# Patient Record
Sex: Male | Born: 1955 | Race: Black or African American | Hispanic: No | Marital: Single | Smoking: Current every day smoker
Health system: Southern US, Community
[De-identification: ages and names within clinical notes are randomized; demographics above are authoritative.]

## PROBLEM LIST (undated history)

## (undated) DIAGNOSIS — F1010 Alcohol abuse, uncomplicated: Secondary | ICD-10-CM

## (undated) DIAGNOSIS — I70229 Atherosclerosis of native arteries of extremities with rest pain, unspecified extremity: Secondary | ICD-10-CM

## (undated) DIAGNOSIS — I10 Essential (primary) hypertension: Secondary | ICD-10-CM

## (undated) DIAGNOSIS — I998 Other disorder of circulatory system: Secondary | ICD-10-CM

## (undated) HISTORY — DX: Other disorder of circulatory system: I99.8

## (undated) HISTORY — DX: Atherosclerosis of native arteries of extremities with rest pain, unspecified extremity: I70.229

## (undated) HISTORY — PX: LEG SURGERY: SHX1003

## (undated) HISTORY — PX: BELOW KNEE LEG AMPUTATION: SUR23

---

## 2000-04-16 ENCOUNTER — Emergency Department (HOSPITAL_COMMUNITY): Admission: EM | Admit: 2000-04-16 | Discharge: 2000-04-16 | Payer: Self-pay | Admitting: Emergency Medicine

## 2001-07-26 ENCOUNTER — Emergency Department (HOSPITAL_COMMUNITY): Admission: EM | Admit: 2001-07-26 | Discharge: 2001-07-27 | Payer: Self-pay | Admitting: Emergency Medicine

## 2001-08-25 ENCOUNTER — Encounter: Payer: Self-pay | Admitting: Emergency Medicine

## 2001-08-25 ENCOUNTER — Emergency Department (HOSPITAL_COMMUNITY): Admission: EM | Admit: 2001-08-25 | Discharge: 2001-08-25 | Payer: Self-pay | Admitting: Emergency Medicine

## 2002-05-28 ENCOUNTER — Emergency Department (HOSPITAL_COMMUNITY): Admission: EM | Admit: 2002-05-28 | Discharge: 2002-05-28 | Payer: Self-pay | Admitting: Emergency Medicine

## 2002-11-17 ENCOUNTER — Encounter: Payer: Self-pay | Admitting: Emergency Medicine

## 2002-11-17 ENCOUNTER — Emergency Department (HOSPITAL_COMMUNITY): Admission: EM | Admit: 2002-11-17 | Discharge: 2002-11-17 | Payer: Self-pay | Admitting: Emergency Medicine

## 2002-12-29 ENCOUNTER — Emergency Department (HOSPITAL_COMMUNITY): Admission: EM | Admit: 2002-12-29 | Discharge: 2002-12-29 | Payer: Self-pay | Admitting: Emergency Medicine

## 2003-06-25 ENCOUNTER — Emergency Department (HOSPITAL_COMMUNITY): Admission: EM | Admit: 2003-06-25 | Discharge: 2003-06-25 | Payer: Self-pay | Admitting: Emergency Medicine

## 2003-10-31 ENCOUNTER — Emergency Department (HOSPITAL_COMMUNITY): Admission: EM | Admit: 2003-10-31 | Discharge: 2003-10-31 | Payer: Self-pay | Admitting: Emergency Medicine

## 2003-12-24 ENCOUNTER — Emergency Department (HOSPITAL_COMMUNITY): Admission: EM | Admit: 2003-12-24 | Discharge: 2003-12-25 | Payer: Self-pay | Admitting: Emergency Medicine

## 2004-10-14 ENCOUNTER — Emergency Department (HOSPITAL_COMMUNITY): Admission: EM | Admit: 2004-10-14 | Discharge: 2004-10-14 | Payer: Self-pay | Admitting: Emergency Medicine

## 2005-02-15 ENCOUNTER — Ambulatory Visit: Payer: Self-pay | Admitting: Family Medicine

## 2005-03-03 ENCOUNTER — Emergency Department (HOSPITAL_COMMUNITY): Admission: EM | Admit: 2005-03-03 | Discharge: 2005-03-03 | Payer: Self-pay | Admitting: Emergency Medicine

## 2005-05-18 ENCOUNTER — Emergency Department (HOSPITAL_COMMUNITY): Admission: EM | Admit: 2005-05-18 | Discharge: 2005-05-18 | Payer: Self-pay | Admitting: Emergency Medicine

## 2005-06-16 ENCOUNTER — Ambulatory Visit: Payer: Self-pay | Admitting: Internal Medicine

## 2005-07-23 ENCOUNTER — Emergency Department (HOSPITAL_COMMUNITY): Admission: EM | Admit: 2005-07-23 | Discharge: 2005-07-23 | Payer: Self-pay | Admitting: Emergency Medicine

## 2006-01-24 ENCOUNTER — Ambulatory Visit: Payer: Self-pay | Admitting: Family Medicine

## 2006-04-15 ENCOUNTER — Emergency Department (HOSPITAL_COMMUNITY): Admission: EM | Admit: 2006-04-15 | Discharge: 2006-04-15 | Payer: Self-pay | Admitting: Emergency Medicine

## 2007-04-03 ENCOUNTER — Ambulatory Visit: Payer: Self-pay | Admitting: Internal Medicine

## 2007-04-03 LAB — CONVERTED CEMR LAB
BUN: 8 mg/dL (ref 6–23)
CO2: 20 meq/L (ref 19–32)
Calcium: 9 mg/dL (ref 8.4–10.5)
Chloride: 103 meq/L (ref 96–112)
Creatinine, Ser: 0.8 mg/dL (ref 0.40–1.50)
Eosinophils Relative: 1 % (ref 0–5)
Glucose, Bld: 111 mg/dL — ABNORMAL HIGH (ref 70–99)
HCT: 41.9 % (ref 39.0–52.0)
Hemoglobin: 14.6 g/dL (ref 13.0–17.0)
Lymphocytes Relative: 37 % (ref 12–46)
Lymphs Abs: 2.4 10*3/uL (ref 0.7–3.3)
Monocytes Absolute: 0.4 10*3/uL (ref 0.2–0.7)
Monocytes Relative: 6 % (ref 3–11)
Neutro Abs: 3.7 10*3/uL (ref 1.7–7.7)
RBC: 4.18 M/uL — ABNORMAL LOW (ref 4.22–5.81)
RDW: 13.4 % (ref 11.5–14.0)
Total Bilirubin: 0.8 mg/dL (ref 0.3–1.2)
WBC: 6.6 10*3/uL (ref 4.0–10.5)

## 2007-04-06 ENCOUNTER — Encounter: Payer: Self-pay | Admitting: Internal Medicine

## 2007-04-06 LAB — CONVERTED CEMR LAB: HCV Ab: NEGATIVE

## 2008-04-15 ENCOUNTER — Emergency Department (HOSPITAL_COMMUNITY): Admission: EM | Admit: 2008-04-15 | Discharge: 2008-04-15 | Payer: Self-pay | Admitting: Emergency Medicine

## 2008-06-24 ENCOUNTER — Ambulatory Visit: Payer: Self-pay | Admitting: Internal Medicine

## 2009-06-25 ENCOUNTER — Ambulatory Visit: Payer: Self-pay | Admitting: Internal Medicine

## 2009-06-25 ENCOUNTER — Encounter (INDEPENDENT_AMBULATORY_CARE_PROVIDER_SITE_OTHER): Payer: Self-pay | Admitting: Adult Health

## 2009-06-25 LAB — CONVERTED CEMR LAB: Microalb, Ur: 0.91 mg/dL (ref 0.00–1.89)

## 2011-06-11 LAB — COMPREHENSIVE METABOLIC PANEL
ALT: 44
AST: 76 — ABNORMAL HIGH
Alkaline Phosphatase: 73
CO2: 29
Calcium: 8.7
GFR calc Af Amer: >60
Glucose, Bld: 107 — ABNORMAL HIGH
Potassium: 4.8
Sodium: 148 — ABNORMAL HIGH
Total Protein: 7.5

## 2011-06-11 LAB — DIFFERENTIAL
Basophils Relative: 1
Eosinophils Absolute: 0.3
Eosinophils Relative: 4
Lymphs Abs: 3.6
Monocytes Relative: 6
Neutrophils Relative %: 37 — ABNORMAL LOW

## 2011-06-11 LAB — CBC
Hemoglobin: 15.7
MCHC: 35.3
RBC: 4.32
RDW: 14

## 2011-09-26 ENCOUNTER — Encounter (HOSPITAL_COMMUNITY): Payer: Self-pay | Admitting: Emergency Medicine

## 2011-09-26 ENCOUNTER — Emergency Department (HOSPITAL_COMMUNITY)
Admission: EM | Admit: 2011-09-26 | Discharge: 2011-09-26 | Disposition: A | Discharge status: Home / Self Care | Payer: Medicaid Other | Attending: Emergency Medicine | Admitting: Emergency Medicine

## 2011-09-26 DIAGNOSIS — M129 Arthropathy, unspecified: Secondary | ICD-10-CM | POA: Insufficient documentation

## 2011-09-26 DIAGNOSIS — M1990 Unspecified osteoarthritis, unspecified site: Secondary | ICD-10-CM

## 2011-09-26 DIAGNOSIS — R000 Tachycardia, unspecified: Secondary | ICD-10-CM | POA: Insufficient documentation

## 2011-09-26 DIAGNOSIS — I10 Essential (primary) hypertension: Secondary | ICD-10-CM | POA: Insufficient documentation

## 2011-09-26 MED ORDER — DICLOFENAC-MISOPROSTOL 50-0.2 MG PO TABS: 1.0000 | ORAL_TABLET | ORAL | Status: DC

## 2011-09-26 MED ORDER — LISINOPRIL 10 MG PO TABS: 10.0000 mg | ORAL_TABLET | Freq: Every day | ORAL | Status: DC

## 2011-09-26 NOTE — ED Provider Notes (Signed)
History     CSN: [id number]  Arrival date & time 09/26/11  0049   First MD Initiated Contact with Patient 09/26/11 0154      Chief Complaint  Patient presents with  . Chest Pain    (Consider location/radiation/quality/duration/timing/severity/associated sxs/prior treatment) The history is provided by the patient.   56 year old male had been seen at his physician's [facility] days ago and told he had to go to the emergency department because his blood pressure was very high. His friend who is with him states that the blood pressure that day was 170/110. He refused to go in that day but says he was able to go today because his wife couldn't take him to the hospital today. He denies chest pain, but he is complaining that he has run out of his Arthrotec and his doctor would not prescribe more Arthrotec in so he had been evaluated for his blood pressure. He states that he stopped taking her hydrochlorothiazide for his blood pressure because it made him throw up. He states that he does not wish to have any further evaluation the emergency department and specifically denies chest pain, dyspnea, nausea, vomiting. He does admit to alcohol and tobacco abuse.  Past Medical History  Diagnosis Date  . Gout     History reviewed. No pertinent past surgical history.  History reviewed. No pertinent family history.  History  Substance Use Topics  . Smoking status: Not on file  . Smokeless tobacco: Not on file  . Alcohol Use: 72.0 oz/week    120 Cans of beer per week     "A whole Bunch - I am a drunk"      Review of Systems  Cardiovascular: Positive for chest pain.  All other systems reviewed and are negative.    Allergies  Review of patient's allergies indicates no known allergies.  Home Medications  No current outpatient prescriptions on file.  BP 134/90  Pulse 105  Temp(Src) 98.3 F (36.8 C) (Oral)  Resp 20  SpO2 97%  Physical Exam  Nursing note  and vitals reviewed.  56 year old male who is resting comfortably and in no acute distress. Vital signs show mild tachycardia with heart rate of 105. Oxygen saturation is 97% which is normal. Head is normocephalic and atraumatic. PERRLA, EOMI. Oropharynx is clear. Neck is supple without adenopathy or JVD. Back is nontender. Lungs are clear without rales, wheezes, rhonchi. Heart has regular rate and rhythm without murmur. Abdomen is soft, flat, nontender without masses or hepatosplenomegaly. Extremities have no cyanosis or edema, full range of motion is present. Skin is warm and moist without rashes. Neurologic: Mental status is normal, cranial nerves are intact, there no focal motor or sensory deficits.  ED Course  Procedures (including critical care time)  Labs Reviewed - No data to display No results found.   1. Hypertension   2. Arthritis       MDM  Arthritis and hypertension. He is given new prescription for his Arthrotec, and he is given a prescription for lisinopril to replace his hydrochlorothiazide. Importance of followup for blood pressure and arthritis was stressed, as well as the importance of stopping smoking and limiting alcohol consumption.        Dione Booze, MD 09/26/11 [phone]

## 2011-09-26 NOTE — ED Notes (Signed)
Patient states that he went to [facility] 2 -3 days ago and they would not give him his pain medications until he was evaluated for his chest pain. The patient keeps saying "I'm Dead"

## 2012-03-04 ENCOUNTER — Emergency Department (HOSPITAL_COMMUNITY)
Admission: EM | Admit: 2012-03-04 | Discharge: 2012-03-05 | Disposition: A | Discharge status: Home / Self Care | Payer: Medicaid Other | Attending: Emergency Medicine | Admitting: Emergency Medicine

## 2012-03-04 ENCOUNTER — Encounter (HOSPITAL_COMMUNITY): Payer: Self-pay | Admitting: *Deleted

## 2012-03-04 DIAGNOSIS — F10929 Alcohol use, unspecified with intoxication, unspecified: Secondary | ICD-10-CM

## 2012-03-04 DIAGNOSIS — F1010 Alcohol abuse, uncomplicated: Secondary | ICD-10-CM | POA: Insufficient documentation

## 2012-03-04 LAB — BASIC METABOLIC PANEL
BUN: 13 mg/dL (ref 6–23)
Calcium: 8.4 mg/dL (ref 8.4–10.5)
Creatinine, Ser: 0.67 mg/dL (ref 0.50–1.35)
GFR calc Af Amer: >90 mL/min (ref >90)
GFR calc non Af Amer: >90 mL/min (ref >90)
Glucose, Bld: 95 mg/dL (ref 70–99)

## 2012-03-04 LAB — CBC
HCT: 39.4 % (ref 39.0–52.0)
Hemoglobin: 13.8 g/dL (ref 13.0–17.0)
MCH: 33.7 pg (ref 26.0–34.0)
MCHC: 35 g/dL (ref 30.0–36.0)
MCV: 96.1 fL (ref 78.0–100.0)
RDW: 12.9 % (ref 11.5–15.5)

## 2012-03-04 MED ORDER — SODIUM CHLORIDE 0.9 % IV BOLUS (SEPSIS)
1000.0000 mL | Freq: Once | INTRAVENOUS | Status: AC
  Administered 2012-03-04: 1000 mL via INTRAVENOUS

## 2012-03-04 NOTE — ED Notes (Signed)
Pt was found at [facility] where his wife used to live and staff called [phone]. Pt laying in floor sleeping and belligerent. Pt smells of ETOH and admits to drinking today. Pt does not want help with his drinking issues.

## 2012-03-04 NOTE — ED Notes (Addendum)
Patient hollering out noises when tended it to patient asked why patient is here. Patient explained. Patient becomes calm. Patient will keep hollering out and trying to slide out of bed after a while and doesn't remember being explained to. Patient soils whole bed

## 2012-03-04 NOTE — ED Notes (Signed)
Urine collected, labeled and placed at bedside 

## 2012-03-04 NOTE — ED Notes (Signed)
Pt placed on monitor. 02 saturations 80% on RA while sleeping. Pt placed on [place] 2L and 02 up to 95%.    Pt alert, but disoriented. Pt rambles about not knowing where he is or what happened.

## 2012-03-04 NOTE — ED Notes (Signed)
ZOX:[id number]<AV> Expected date:03/04/12<BR> Expected time: 5:55 PM<BR> Means of arrival:Ambulance<BR> Comments:<BR> BH. Pt35. 56 m. Detox req. Pt is intoxicated and requesting detox for alcohol. 8 mins

## 2012-03-05 NOTE — Discharge Instructions (Signed)
Alcohol Intoxication  You have alcohol intoxication when the amount of alcohol that you have consumed has impaired your ability to mentally and physically function. There are a variety of factors that contribute to the level at which alcohol intoxication can occur, such as age, gender, weight, frequency of alcohol consumption, medication use, and the presence of other medical conditions, such as diabetes, seizures, or heart conditions.  The blood alcohol level test measures the concentration of alcohol in your blood. In most states, your blood alcohol level must be lower than 80 mg/dL (0.08%) to legally drive. However, many dangerous effects of alcohol can occur at much lower levels.  Alcohol directly impairs the normal chemical activity of the brain and is said to be a chemical depressant. Alcohol can cause drowsiness, stupor, respiratory failure, and coma. Other physical effects can include headache, vomiting, vomiting of blood, abdominal pain, a fast heartbeat, difficulty breathing, anxiety, and amnesia. Alcohol intoxication can also lead to dangerous and life-threatening activities, such as fighting, dangerous operation of vehicles or heavy machinery, and risky sexual behavior.  Alcohol can be especially dangerous when taken with other drugs. Some of these drugs are:   Sedatives.   Painkillers.   Marijuana.   Tranquilizers.   Antihistamines.   Muscle relaxants.   Seizure medicine.  Many of the effects of acute alcohol intoxication are temporary. However, repeated alcohol intoxication can lead to severe medical illnesses. If you have alcohol intoxication, you should:   Stay hydrated. Drink enough water and fluids to keep your urine clear or pale yellow. Avoid excessive caffeine because this can further lead to dehydration.   Eat a healthy diet. You may have residual nausea, headache, and loss of appetite, but it is still important that you maintain good nutrition. You can start with clear  liquids.   Take nonsteroidal anti-inflammatory medications as needed for headaches, but make sure to do so with small meals. You should avoid acetaminophen for several days after having alcohol intoxication because the combination of alcohol and acetaminophen can be toxic to your liver.  If you have frequent alcohol intoxication, ask your friends and family if they think you have a drinking problem. For further help, contact:   Your caregiver.   Alcoholics Anonymous (AA).   A drug or alcohol rehabilitation program.  SEEK MEDICAL CARE IF:    You have persistent vomiting.   You have persistent pain in any part of your body.   You do not feel better after a few days.  SEEK IMMEDIATE MEDICAL CARE IF:    You become shaky or tremble when you try to stop drinking.   You shake uncontrollably (seizure).   You throw up (vomit) blood. This may be bright red or it may look like black coffee grounds.   You have blood in the stool. This may be bright red or appear as a black, tarry, bad smelling stool.   You become lightheaded or faint.  ANY OF THESE SYMPTOMS MAY REPRESENT A SERIOUS PROBLEM THAT IS AN EMERGENCY. Do not wait to see if the symptoms will go away. Get medical help right away. Call your local emergency services (911 in [place] DO NOT drive yourself to the hospital.  MAKE SURE YOU:    Understand these instructions.   Will watch your condition.   Will get help right away if you are not doing well or get worse.  Document Released: 06/09/2005 Document Revised: 08/19/2011 Document Reviewed: 02/16/2010  [employer] Patient Information 2012 [employer].

## 2012-03-05 NOTE — ED Notes (Signed)
Patient ambulated in hall to assure stability for when patient is discharged. Patient able to ambulate independently without assistance of staff. MD Brooke Dare visualized patient ambulating.

## 2012-03-05 NOTE — ED Notes (Signed)
Patient given discharge instructions, information, prescriptions, and diet order. Patient states that they adequately understand discharge information given and to return to ED if symptoms return or worsen.    Pt informed to slow down/stop drinking and informed that it can be dangerous to try to do that alone. To return to [facility] if he would like detox help.

## 2012-03-05 NOTE — ED Provider Notes (Signed)
The patient ambulated without assistance. He will be discharged.  Dayton Bailiff, MD 03/05/12 [phone]

## 2012-05-23 NOTE — ED Provider Notes (Signed)
History     CSN: [id number]  Arrival date & time 03/04/12  1806   First MD Initiated Contact with Patient 03/04/12 1838      Chief Complaint  Patient presents with  . Alcohol Intoxication     The history is provided by the patient and the EMS personnel. History Limited By: Level V caveat: AMS, alcohol intoxication.  pt found lying on floor at ex wife house today. Smells of ETOH to EMS. Brought to ER for evaluation. Pt reports drinking a large amount of ETOH today. No HI or SI. No CP or SOB. No headache or neck pain. No weakness of upper or lower extremities. Reports "just let me sleep". EMS report no obvious trauma. Pt reports long time ETOH abuse, does not want help with his problem today  Past Medical History  Diagnosis Date  . Gout     History reviewed. No pertinent past surgical history.  No family history on file.  History  Substance Use Topics  . Smoking status: Not on file  . Smokeless tobacco: Not on file  . Alcohol Use: 72.0 oz/week    120 Cans of beer per week     "A whole Bunch - I am a drunk"      Review of Systems  Unable to perform ROS   Allergies  Review of patient's allergies indicates no known allergies.  Home Medications   Current Outpatient Rx  Name Route Sig Dispense Refill  . DICLOFENAC-MISOPROSTOL 50-200 MG-MCG PO TABS Oral Take 1 tablet by mouth 1 day or 1 dose. 30 tablet 0  . LISINOPRIL 10 MG PO TABS Oral Take 1 tablet (10 mg total) by mouth daily. 30 tablet 0    BP 108/68  Pulse 79  Temp 97.5 F (36.4 C) (Oral)  Resp 16  SpO2 99%  Physical Exam  Nursing note and vitals reviewed. Constitutional: He appears well-developed and well-nourished.  HENT:  Head: Normocephalic and atraumatic.  Eyes: EOM are normal.  Neck: Normal range of motion.  Cardiovascular: Normal rate, regular rhythm, normal heart sounds and intact distal pulses.   Pulmonary/Chest: Effort normal and breath sounds normal. No respiratory distress.  Abdominal:  Soft. He exhibits no distension. There is no tenderness.  Musculoskeletal: Normal range of motion.  Neurological:       Awakens to loud voice  Skin: Skin is warm and dry.  Psychiatric: He has a normal mood and affect. Judgment normal.    ED Course  Procedures (including critical care time)  Labs Reviewed  ETHANOL - Abnormal; Notable for the following:    Alcohol, Ethyl (B) 426 (*)     All other components within normal limits  CBC - Abnormal; Notable for the following:    RBC 4.10 (*)     All other components within normal limits  BASIC METABOLIC PANEL - Abnormal; Notable for the following:    Potassium 3.3 (*)     All other components within normal limits  LAB REPORT - SCANNED   No results found.   1. Alcohol intoxication       MDM  Pt will need to sober in ER. Does not want help with his ETOH. Care to Dr Brooke Dare        Lyanne Co, MD 05/23/12 [phone]

## 2012-07-14 ENCOUNTER — Emergency Department (HOSPITAL_COMMUNITY)
Admission: EM | Admit: 2012-07-14 | Discharge: 2012-07-15 | Disposition: A | Discharge status: Home / Self Care | Payer: Medicaid Other | Attending: Emergency Medicine | Admitting: Emergency Medicine

## 2012-07-14 ENCOUNTER — Other Ambulatory Visit: Payer: Self-pay

## 2012-07-14 ENCOUNTER — Encounter (HOSPITAL_COMMUNITY): Payer: Self-pay | Admitting: Emergency Medicine

## 2012-07-14 DIAGNOSIS — Z79899 Other long term (current) drug therapy: Secondary | ICD-10-CM | POA: Insufficient documentation

## 2012-07-14 DIAGNOSIS — M109 Gout, unspecified: Secondary | ICD-10-CM

## 2012-07-14 DIAGNOSIS — R51 Headache: Secondary | ICD-10-CM

## 2012-07-14 DIAGNOSIS — F17200 Nicotine dependence, unspecified, uncomplicated: Secondary | ICD-10-CM | POA: Insufficient documentation

## 2012-07-14 DIAGNOSIS — H538 Other visual disturbances: Secondary | ICD-10-CM | POA: Insufficient documentation

## 2012-07-14 DIAGNOSIS — I10 Essential (primary) hypertension: Secondary | ICD-10-CM

## 2012-07-14 HISTORY — DX: Essential (primary) hypertension: I10

## 2012-07-14 LAB — CBC
Hemoglobin: 14.6 g/dL (ref 13.0–17.0)
MCH: 33 pg (ref 26.0–34.0)
MCHC: 36.2 g/dL — ABNORMAL HIGH (ref 30.0–36.0)
Platelets: 287 10*3/uL (ref 150–400)
RDW: 13.6 % (ref 11.5–15.5)

## 2012-07-14 LAB — COMPREHENSIVE METABOLIC PANEL
ALT: 15 U/L (ref 0–53)
Albumin: 4.6 g/dL (ref 3.5–5.2)
Calcium: 9.1 mg/dL (ref 8.4–10.5)
GFR calc Af Amer: >90 mL/min (ref >90)
Glucose, Bld: 107 mg/dL — ABNORMAL HIGH (ref 70–99)
Sodium: 142 mEq/L (ref 135–145)
Total Protein: 8.4 g/dL — ABNORMAL HIGH (ref 6.0–8.3)

## 2012-07-14 LAB — POCT I-STAT TROPONIN I: Troponin i, poc: 0.02 ng/mL (ref 0.00–0.08)

## 2012-07-14 MED ORDER — LISINOPRIL 10 MG PO TABS: 10.0000 mg | ORAL_TABLET | Freq: Every day | ORAL | Status: DC

## 2012-07-14 MED ORDER — KETOROLAC TROMETHAMINE 60 MG/2ML IM SOLN
60.0000 mg | Freq: Once | INTRAMUSCULAR | Status: AC
  Administered 2012-07-15: 60 mg via INTRAMUSCULAR
  Filled 2012-07-14: qty 2

## 2012-07-14 MED ORDER — COLCHICINE 0.6 MG PO TABS: 0.6000 mg | ORAL_TABLET | Freq: Two times a day (BID) | ORAL | Status: DC

## 2012-07-14 NOTE — ED Notes (Addendum)
Pt reports last took BP meds in Jan; needs BP meds; pt +ETOH, states has had 3-4 40 ounces tonight; grips equal, speech slurred-pt reports is normal, no drift noted, face symmetrical; pt appears very intoxicated at triage; pt does report having headache; pt reports having cocaine about an hour and half ago; pt reports hx of heart trouble with tachycardia; pt denies CP currently; very difficult to obtain info from pt d/t ETOH

## 2012-07-14 NOTE — ED Notes (Signed)
Pt reports (L) foot/toe pain and a headache to top of head has not had his high blood pressure medicine in 9 months. Pt denies chest pain, sob, back/abd pain, N/V, cough or congestion. Pt reports he has been taking his sister's bp meds "here and there." Pt grips and strengths are equal bilateral, no facial droop or arm drift. Pt reports drinking "a lot of beer" tonight and smoked cracked. Pt sts "my friends gave it to me."  Pt also reports blurry vision w/a hx of the same x3-4 years

## 2012-07-14 NOTE — ED Provider Notes (Signed)
History     CSN: [id number]  Arrival date & time 07/14/12  2023   First MD Initiated Contact with Patient 07/14/12 2259      Chief Complaint  Patient presents with  . Hypertension    (Consider location/radiation/quality/duration/timing/severity/associated sxs/prior treatment) HPI Comments: 56 year old male with a history of hypertension and gout who presents with several complaints including a headache, left great toe pain, intermittent palpitations and intermittent blurred vision over the last several years. He admits to drinking alcohol this evening as well as smoking crack. The headache is mild, the pain in his left foot is significant and consistent with prior gout episodes. He has not had medication prior to arrival for pain though he has been taking his sister's blood pressure medication over the last 9 months. In the past he has been taking lisinopril for blood pressure. He had also been on a beta blocker before that. He does not currently have his own prescription for these medications. The symptoms are mild, intermittent, nothing seems to make them better or worse, no associated chest pain shortness of breath fevers or chills or nausea or vomiting.  Patient is a 56 y.o. male presenting with hypertension. The history is provided by the patient.  Hypertension    Past Medical History  Diagnosis Date  . Gout   . Hypertension     Past Surgical History  Procedure Date  . Leg surgery     History reviewed. No pertinent family history.  History  Substance Use Topics  . Smoking status: Current Every Day Smoker -- 0.5 packs/day    Types: Cigarettes  . Smokeless tobacco: Not on file  . Alcohol Use: 72.0 oz/week    120 Cans of beer per week     "A whole Bunch - I am a drunk"      Review of Systems  All other systems reviewed and are negative.    Allergies  Review of patient's allergies indicates no known allergies.  Home Medications   Current Outpatient Rx  Name  Route Sig Dispense Refill  . IBUPROFEN 200 MG PO TABS Oral Take 200 mg by mouth every 6 (six) hours as needed. For pain    . COLCHICINE 0.6 MG PO TABS Oral Take 1 tablet (0.6 mg total) by mouth 2 (two) times daily. 8 tablet 0  . LISINOPRIL 10 MG PO TABS Oral Take 1 tablet (10 mg total) by mouth daily. 30 tablet 1    BP 122/76  Pulse 91  Temp 98 F (36.7 C) (Oral)  Resp 19  SpO2 96%  Physical Exam  Nursing note and vitals reviewed. Constitutional: He appears well-developed and well-nourished. No distress.  HENT:  Head: Normocephalic and atraumatic.  Mouth/Throat: Oropharynx is clear and moist. No oropharyngeal exudate.  Eyes: Conjunctivae normal and EOM are normal. Pupils are equal, round, and reactive to light. Right eye exhibits no discharge. Left eye exhibits no discharge. No scleral icterus.  Neck: Normal range of motion. Neck supple. No JVD present. No thyromegaly present.  Cardiovascular: Normal rate, regular rhythm, normal heart sounds and intact distal pulses.  Exam reveals no gallop and no friction rub.   No murmur heard. Pulmonary/Chest: Effort normal and breath sounds normal. No respiratory distress. He has no wheezes. He has no rales.  Abdominal: Soft. Bowel sounds are normal. He exhibits no distension and no mass. There is no tenderness.  Musculoskeletal: Normal range of motion. He exhibits tenderness (swelling and tenderness over the first MTP of the left foot).  He exhibits no edema.  Lymphadenopathy:    He has no cervical adenopathy.  Neurological: He is alert. Coordination normal.  Skin: Skin is warm and dry. No rash noted. No erythema.  Psychiatric: He has a normal mood and affect. His behavior is normal.    ED Course  Procedures (including critical care time)  Labs Reviewed  CBC - Abnormal; Notable for the following:    MCHC 36.2 (*)     All other components within normal limits  COMPREHENSIVE METABOLIC PANEL - Abnormal; Notable for the following:    Glucose,  Bld 107 (*)     Total Protein 8.4 (*)     Total Bilirubin 0.2 (*)     All other components within normal limits  POCT I-STAT TROPONIN I   No results found.   1. Gout   2. Headache   3. Hypertension       MDM  The patient has normal neurologic exam with gait speech and coordination. He is able to walk without any difficulty and his cardiac and pulmonary exams are normal as well. His lab work shows that he has no anemia, no leukocytosis, normal renal function and normal electrolytes. We'll begin treatment with lisinopril though his blood pressure is normal today he has been using his sister's blood pressure medications. We'll avoid beta blockers as the patient apparently is abusing drugs though he states this is a rare event for him. The patient appears stable for discharge.  ED ECG REPORT  I personally interpreted this EKG   Date: 07/14/2012   Rate: 120  Rhythm: sinus tachycardia  QRS Axis: left  Intervals: normal  ST/T Wave abnormalities: normal conduction Disutrbances:none  Narrative Interpretation:   Old EKG Reviewed: none available  Vital signs have improved blood pressure of 122/76 and a pulse of 91 on discharge.   Discharge Prescriptions include:   Lisinopril  Colchicine       Vida Roller, MD 07/14/12 2342

## 2012-07-15 NOTE — ED Notes (Signed)
Patient given copy of discharge paperwork; went over discharge instructions with patient.  Patient instructed to take prescriptions as directed, to follow up with primary care physician/referrals as directed for follow up/medication refills, and to return to the ED for new, worsening, or concerning symptoms.

## 2013-02-24 ENCOUNTER — Emergency Department (HOSPITAL_COMMUNITY): Payer: Medicaid Other

## 2013-02-24 DIAGNOSIS — M79609 Pain in unspecified limb: Secondary | ICD-10-CM | POA: Insufficient documentation

## 2013-02-24 DIAGNOSIS — R079 Chest pain, unspecified: Secondary | ICD-10-CM | POA: Insufficient documentation

## 2013-02-24 DIAGNOSIS — Z791 Long term (current) use of non-steroidal anti-inflammatories (NSAID): Secondary | ICD-10-CM | POA: Insufficient documentation

## 2013-02-24 DIAGNOSIS — R109 Unspecified abdominal pain: Secondary | ICD-10-CM | POA: Insufficient documentation

## 2013-02-24 DIAGNOSIS — M109 Gout, unspecified: Secondary | ICD-10-CM | POA: Insufficient documentation

## 2013-02-24 DIAGNOSIS — R112 Nausea with vomiting, unspecified: Secondary | ICD-10-CM | POA: Insufficient documentation

## 2013-02-24 DIAGNOSIS — Z79899 Other long term (current) drug therapy: Secondary | ICD-10-CM | POA: Insufficient documentation

## 2013-02-24 DIAGNOSIS — F10229 Alcohol dependence with intoxication, unspecified: Secondary | ICD-10-CM | POA: Insufficient documentation

## 2013-02-24 LAB — BASIC METABOLIC PANEL
CO2: 20 mEq/L (ref 19–32)
Calcium: 9.2 mg/dL (ref 8.4–10.5)
Chloride: 105 mEq/L (ref 96–112)
Potassium: 3.2 mEq/L — ABNORMAL LOW (ref 3.5–5.1)
Sodium: 140 mEq/L (ref 135–145)

## 2013-02-24 LAB — POCT I-STAT TROPONIN I: Troponin i, poc: 0 ng/mL (ref 0.00–0.08)

## 2013-02-24 LAB — CBC
Platelets: 280 10*3/uL (ref 150–400)
RBC: 4.26 MIL/uL (ref 4.22–5.81)
WBC: 9.9 10*3/uL (ref 4.0–10.5)

## 2013-02-24 LAB — LIPASE, BLOOD: Lipase: 65 U/L — ABNORMAL HIGH (ref 11–59)

## 2013-02-24 MED ORDER — ONDANSETRON 4 MG PO TBDP
ORAL_TABLET | ORAL | Status: AC
  Filled 2013-02-24: qty 1

## 2013-02-24 MED ORDER — ONDANSETRON HCL 4 MG PO TABS: 4.0000 mg | ORAL_TABLET | Freq: Once | ORAL | Status: DC

## 2013-02-24 NOTE — ED Notes (Addendum)
Pt. N/v to ~ 1 hr. Ago. bp been high today. Vomiting blood according to family but pt. Denies it. ETOH on board. Cp x 1 week ago..centralized

## 2013-02-25 ENCOUNTER — Emergency Department (HOSPITAL_COMMUNITY)
Admission: EM | Admit: 2013-02-25 | Discharge: 2013-02-25 | Disposition: A | Discharge status: Home / Self Care | Payer: Medicaid Other | Attending: Emergency Medicine | Admitting: Emergency Medicine

## 2013-02-25 DIAGNOSIS — R109 Unspecified abdominal pain: Secondary | ICD-10-CM

## 2013-02-25 DIAGNOSIS — R112 Nausea with vomiting, unspecified: Secondary | ICD-10-CM

## 2013-02-25 DIAGNOSIS — M79601 Pain in right arm: Secondary | ICD-10-CM

## 2013-02-25 DIAGNOSIS — F10920 Alcohol use, unspecified with intoxication, uncomplicated: Secondary | ICD-10-CM

## 2013-02-25 LAB — HEPATIC FUNCTION PANEL
Alkaline Phosphatase: 54 U/L (ref 39–117)
Total Protein: 7.5 g/dL (ref 6.0–8.3)

## 2013-02-25 LAB — ETHANOL: Alcohol, Ethyl (B): 180 mg/dL — ABNORMAL HIGH (ref 0–11)

## 2013-02-25 MED ORDER — OMEPRAZOLE 20 MG PO CPDR: 40.0000 mg | DELAYED_RELEASE_CAPSULE | Freq: Every day | ORAL | Status: DC

## 2013-02-25 MED ORDER — ONDANSETRON 4 MG PO TBDP: 4.0000 mg | ORAL_TABLET | Freq: Three times a day (TID) | ORAL | Status: DC | PRN

## 2013-02-25 NOTE — ED Provider Notes (Signed)
History     CSN: [id number]  Arrival date & time 02/24/13  2017   First MD Initiated Contact with Patient 02/25/13 0035      Chief Complaint  Patient presents with  . Weakness  . Chest Pain  . Emesis  . Alcohol Intoxication    (Consider location/radiation/quality/duration/timing/severity/associated sxs/prior treatment) HPI 57 yo male presents to the ER brought in by family.  They are no longer present, and history is limited to the patient who is intoxicated.  He reports he went to a friends house today and drank quite a bit.  Pt drinks daily.  He does not wish to stop at this time.  He is unsure how his sister came to bring him to the ER.  He is complaining of right arm pain ongoing for 8-9 months.  He is also c/o left flank pain x 2 weeks.  It is reported he had some chest pain about a week ago, but he reports this only lasted a brief time then, and has not returned.  There is report of n/v, possibly with blood.  Pt denies prior h/o bloody emesis.  He does not remember how much he threw up or what it looked like.  He does not think he threw up blood.  Pt reports his blood pressure has been running high due to running out of his bp meds.  He does not know the name of what he was on. Past Medical History  Diagnosis Date  . Gout   . Hypertension     Past Surgical History  Procedure Laterality Date  . Leg surgery      No family history on file.  History  Substance Use Topics  . Smoking status: Current Every Day Smoker -- 0.50 packs/day    Types: Cigarettes  . Smokeless tobacco: Not on file  . Alcohol Use: 72.0 oz/week    120 Cans of beer per week     Comment: "A whole Bunch - I am a drunk"      Review of Systems  Unable to perform ROS: Other   Intoxicated  Allergies  Review of patient's allergies indicates no known allergies.  Home Medications   Current Outpatient Rx  Name  Route  Sig  Dispense  Refill  . Naproxen Sodium (ALEVE PO)   Oral   Take 1-2 tablets by  mouth every 12 (twelve) hours as needed (leg and shoulder pain).         Marland Kitchen omeprazole (PRILOSEC) 20 MG capsule   Oral   Take 2 capsules (40 mg total) by mouth daily.   30 capsule   0   . ondansetron (ZOFRAN-ODT) 4 MG disintegrating tablet   Oral   Take 1 tablet (4 mg total) by mouth every 8 (eight) hours as needed for nausea.   20 tablet   0     BP 118/74  Pulse 67  Temp(Src) 98.4 F (36.9 C) (Oral)  Resp 13  SpO2 94%  Physical Exam  Nursing note and vitals reviewed. Constitutional: He is oriented to person, place, and time. He appears well-developed and well-nourished.  HENT:  Head: Normocephalic and atraumatic.  Right Ear: External ear normal.  Left Ear: External ear normal.  Nose: Nose normal.  Mouth/Throat: Oropharynx is clear and moist.  Eyes: Conjunctivae and EOM are normal. Pupils are equal, round, and reactive to light.  Neck: Normal range of motion. Neck supple. No JVD present. No tracheal deviation present. No thyromegaly present.  Cardiovascular: Normal rate,  regular rhythm, normal heart sounds and intact distal pulses.  Exam reveals no gallop and no friction rub.   No murmur heard. Pulmonary/Chest: Effort normal and breath sounds normal. No stridor. No respiratory distress. He has no wheezes. He has no rales. He exhibits no tenderness.  Abdominal: Soft. Bowel sounds are normal. He exhibits no distension and no mass. There is tenderness (mild ttp in mid left abd and flank.  no trauma noted, no rebound or guarding,). There is no rebound and no guarding.  Musculoskeletal: Normal range of motion. He exhibits tenderness. He exhibits no edema.  ttp over entire right arm.  Normal ROM, no skin changes, no deformities, no effusion, no crepitus.  Normal cap refill, normal sensation  Lymphadenopathy:    He has no cervical adenopathy.  Neurological: He is oriented to person, place, and time. He has normal reflexes. No cranial nerve deficit. He exhibits normal muscle tone.  Coordination normal.  Skin: Skin is warm and dry. No rash noted. No erythema. No pallor.  Psychiatric: He has a normal mood and affect. His behavior is normal. Judgment and thought content normal.    ED Course  Procedures (including critical care time)  Labs Reviewed  CBC - Abnormal; Notable for the following:    MCHC 36.2 (*)    All other components within normal limits  BASIC METABOLIC PANEL - Abnormal; Notable for the following:    Potassium 3.2 (*)    All other components within normal limits  LIPASE, BLOOD - Abnormal; Notable for the following:    Lipase 65 (*)    All other components within normal limits  ETHANOL - Abnormal; Notable for the following:    Alcohol, Ethyl (B) 180 (*)    All other components within normal limits  HEPATIC FUNCTION PANEL - Abnormal; Notable for the following:    Total Bilirubin 0.2 (*)    All other components within normal limits  POCT I-STAT TROPONIN I   Dg Chest 2 View  02/24/2013   *RADIOLOGY REPORT*  Clinical Data: Chest pain, weakness.  CHEST - 2 VIEW  Comparison: None.  Findings: Coarse perihilar and infrahilar interstitial markings. No confluent airspace infiltrate or overt edema.  No effusion. Heart size upper limits normal.  Regional bones unremarkable.  IMPRESSION:  1.  No acute disease   Original Report Authenticated By: D. Andria Rhein, MD    Date: 02/24/2013  Rate: 113   Rhythm: sinus tachycardia  QRS Axis: normal  Intervals: normal  ST/T Wave abnormalities: normal  Conduction Disutrbances:none  Narrative Interpretation:   Old EKG Reviewed: none available    1. Arm pain, right   2. Left flank pain   3. Alcohol intoxication, uncomplicated   4. Nausea and vomiting       MDM  57 year old male complaining of chronic right arm pain, left flank pain for 2 weeks.  Currently intoxicated.  Patient advised that he said stop smoking as this will help his underlying gallop.  Workup otherwise, unremarkable.  Patient given followup  information        Olivia Mackie, MD 02/26/13 [phone]

## 2013-09-18 ENCOUNTER — Encounter (HOSPITAL_COMMUNITY): Payer: Self-pay | Admitting: Emergency Medicine

## 2013-09-18 ENCOUNTER — Emergency Department (HOSPITAL_COMMUNITY): Payer: Medicaid Other

## 2013-09-18 ENCOUNTER — Inpatient Hospital Stay (HOSPITAL_COMMUNITY)
Admission: EM | Admit: 2013-09-18 | Discharge: 2013-09-26 | DRG: 603 | Disposition: A | Discharge status: Home Health | Payer: Medicaid Other | Attending: Internal Medicine | Admitting: Internal Medicine

## 2013-09-18 DIAGNOSIS — F1010 Alcohol abuse, uncomplicated: Secondary | ICD-10-CM | POA: Diagnosis present

## 2013-09-18 DIAGNOSIS — I808 Phlebitis and thrombophlebitis of other sites: Secondary | ICD-10-CM | POA: Diagnosis not present

## 2013-09-18 DIAGNOSIS — L03116 Cellulitis of left lower limb: Secondary | ICD-10-CM

## 2013-09-18 DIAGNOSIS — J449 Chronic obstructive pulmonary disease, unspecified: Secondary | ICD-10-CM | POA: Diagnosis present

## 2013-09-18 DIAGNOSIS — L02619 Cutaneous abscess of unspecified foot: Principal | ICD-10-CM | POA: Diagnosis present

## 2013-09-18 DIAGNOSIS — I10 Essential (primary) hypertension: Secondary | ICD-10-CM

## 2013-09-18 DIAGNOSIS — F17200 Nicotine dependence, unspecified, uncomplicated: Secondary | ICD-10-CM | POA: Diagnosis present

## 2013-09-18 DIAGNOSIS — J4489 Other specified chronic obstructive pulmonary disease: Secondary | ICD-10-CM | POA: Diagnosis present

## 2013-09-18 DIAGNOSIS — M109 Gout, unspecified: Secondary | ICD-10-CM | POA: Diagnosis present

## 2013-09-18 DIAGNOSIS — E119 Type 2 diabetes mellitus without complications: Secondary | ICD-10-CM | POA: Diagnosis present

## 2013-09-18 DIAGNOSIS — L0390 Cellulitis, unspecified: Secondary | ICD-10-CM

## 2013-09-18 DIAGNOSIS — E876 Hypokalemia: Secondary | ICD-10-CM

## 2013-09-18 DIAGNOSIS — L02612 Cutaneous abscess of left foot: Secondary | ICD-10-CM

## 2013-09-18 DIAGNOSIS — L03119 Cellulitis of unspecified part of limb: Principal | ICD-10-CM

## 2013-09-18 HISTORY — DX: Alcohol abuse, uncomplicated: F10.10

## 2013-09-18 LAB — COMPREHENSIVE METABOLIC PANEL
ALBUMIN: 3.9 g/dL (ref 3.5–5.2)
ALK PHOS: 123 U/L — AB (ref 39–117)
ALT: 23 U/L (ref 0–53)
AST: 32 U/L (ref 0–37)
BUN: 16 mg/dL (ref 6–23)
CALCIUM: 9 mg/dL (ref 8.4–10.5)
CO2: 23 mEq/L (ref 19–32)
CREATININE: 0.65 mg/dL (ref 0.50–1.35)
Chloride: 93 mEq/L — ABNORMAL LOW (ref 96–112)
GFR calc non Af Amer: >90 mL/min (ref >90)
GLUCOSE: 103 mg/dL — AB (ref 70–99)
POTASSIUM: 3.4 meq/L — AB (ref 3.7–5.3)
Sodium: 136 mEq/L — ABNORMAL LOW (ref 137–147)
TOTAL PROTEIN: 8.4 g/dL — AB (ref 6.0–8.3)
Total Bilirubin: 0.4 mg/dL (ref 0.3–1.2)

## 2013-09-18 LAB — CBC WITH DIFFERENTIAL/PLATELET
BASOS PCT: 0 % (ref 0–1)
Basophils Absolute: 0 10*3/uL (ref 0.0–0.1)
EOS ABS: 0.1 10*3/uL (ref 0.0–0.7)
EOS PCT: 1 % (ref 0–5)
HCT: 39.4 % (ref 39.0–52.0)
HEMOGLOBIN: 14.4 g/dL (ref 13.0–17.0)
LYMPHS ABS: 2.5 10*3/uL (ref 0.7–4.0)
Lymphocytes Relative: 15 % (ref 12–46)
MCH: 34.1 pg — AB (ref 26.0–34.0)
MCHC: 36.5 g/dL — AB (ref 30.0–36.0)
MCV: 93.4 fL (ref 78.0–100.0)
MONOS PCT: 8 % (ref 3–12)
Monocytes Absolute: 1.3 10*3/uL — ABNORMAL HIGH (ref 0.1–1.0)
NEUTROS PCT: 76 % (ref 43–77)
Neutro Abs: 12.3 10*3/uL — ABNORMAL HIGH (ref 1.7–7.7)
Platelets: 358 10*3/uL (ref 150–400)
RBC: 4.22 MIL/uL (ref 4.22–5.81)
RDW: 13.3 % (ref 11.5–15.5)
WBC: 16.2 10*3/uL — ABNORMAL HIGH (ref 4.0–10.5)

## 2013-09-18 MED ORDER — VANCOMYCIN HCL IN DEXTROSE 1-5 GM/200ML-% IV SOLN
1000.0000 mg | Freq: Once | INTRAVENOUS | Status: AC
  Administered 2013-09-19: 1000 mg via INTRAVENOUS
  Filled 2013-09-18: qty 200

## 2013-09-18 MED ORDER — PIPERACILLIN-TAZOBACTAM 3.375 G IVPB 30 MIN
3.3750 g | Freq: Once | INTRAVENOUS | Status: AC
  Administered 2013-09-19: 3.375 g via INTRAVENOUS
  Filled 2013-09-18: qty 50

## 2013-09-18 NOTE — ED Notes (Signed)
States his left foot started swelling last Thursday.  Foot red, swollen.  States his last 4 toes are the worse

## 2013-09-18 NOTE — ED Provider Notes (Signed)
CSN: [id number]     Arrival date & time 09/18/13  1904 History   First MD Initiated Contact with Patient 09/18/13 2338     Chief Complaint  Patient presents with  . Foot Swelling   (Consider location/radiation/quality/duration/timing/severity/associated sxs/prior Treatment) Patient is a 58 y.o. male presenting with lower extremity pain. The history is provided by the patient. No language interpreter was used.  Foot Pain This is a new problem. The current episode started more than 2 days ago. The problem occurs constantly. The problem has been gradually worsening. Pertinent negatives include no chest pain, no abdominal pain, no headaches and no shortness of breath. Nothing aggravates the symptoms. Nothing relieves the symptoms. He has tried nothing for the symptoms. The treatment provided no relief.    Past Medical History  Diagnosis Date  . Gout   . Hypertension   . ETOH abuse    Past Surgical History  Procedure Laterality Date  . Leg surgery     History reviewed. No pertinent family history. History  Substance Use Topics  . Smoking status: Current Every Day Smoker -- 0.50 packs/day    Types: Cigarettes  . Smokeless tobacco: Not on file  . Alcohol Use: 72.0 oz/week    120 Cans of beer per week     Comment: "A whole Bunch - I am a drunk"    Review of Systems  Constitutional: Positive for fever.  Respiratory: Negative for shortness of breath.   Cardiovascular: Negative for chest pain.  Gastrointestinal: Positive for vomiting. Negative for abdominal pain.  Neurological: Negative for headaches.  All other systems reviewed and are negative.    Allergies  Review of patient's allergies indicates no known allergies.  Home Medications   Current Outpatient Rx  Name  Route  Sig  Dispense  Refill  . diltiazem (TIAZAC) 120 MG 24 hr capsule   Oral   Take 120 mg by mouth daily.         Marland Kitchen. ibuprofen (ADVIL,MOTRIN) 200 MG tablet   Oral   Take 400-600 mg by mouth 2 (two) times  daily as needed for moderate pain.          Marland Kitchen. triamterene-hydrochlorothiazide (MAXZIDE-25) 37.5-25 MG per tablet   Oral   Take 1 tablet by mouth daily.          BP 133/75  Pulse 94  Temp(Src) 98.3 F (36.8 C) (Oral)  Resp 18  Ht 5\' 4"  (1.626 m)  Wt 180 lb (81.647 kg)  BMI 30.88 kg/m2  SpO2 98% Physical Exam  Constitutional: He is oriented to person, place, and time. He appears well-developed and well-nourished.  HENT:  Head: Normocephalic and atraumatic.  Mouth/Throat: Oropharynx is clear and moist.  Eyes: Conjunctivae are normal. Pupils are equal, round, and reactive to light.  Neck: Normal range of motion. Neck supple.  Cardiovascular: Normal rate, regular rhythm and intact distal pulses.   Pulmonary/Chest: Effort normal and breath sounds normal. He has no wheezes. He has no rales.  Abdominal: Soft. Bowel sounds are normal. There is no tenderness. There is no rebound and no guarding.  Musculoskeletal: Normal range of motion. He exhibits edema and tenderness.  Erythema and warmth of the left foot to the mid shin  Neurological: He is alert and oriented to person, place, and time.  Skin: Skin is warm and dry. He is not diaphoretic.  Psychiatric: He has a normal mood and affect.    ED Course  Procedures (including critical care time) Labs Review Labs Reviewed  CBC WITH DIFFERENTIAL - Abnormal; Notable for the following:    WBC 16.2 (*)    MCH 34.1 (*)    MCHC 36.5 (*)    Neutro Abs 12.3 (*)    Monocytes Absolute 1.3 (*)    All other components within normal limits  COMPREHENSIVE METABOLIC PANEL - Abnormal; Notable for the following:    Sodium 136 (*)    Potassium 3.4 (*)    Chloride 93 (*)    Glucose, Bld 103 (*)    Total Protein 8.4 (*)    Alkaline Phosphatase 123 (*)    All other components within normal limits  CULTURE, BLOOD (ROUTINE X 2)  CULTURE, BLOOD (ROUTINE X 2)   Imaging Review Dg Foot Complete Left  09/18/2013   CLINICAL DATA:  Chronic left foot  swelling. Six day history of diffuse left foot pain.  EXAM: LEFT FOOT - COMPLETE 3+ VIEW  COMPARISON:  None  FINDINGS: The bones are osteopenic. There is a pes planus type contour on the lateral film. There is a mild hallux valgus type contour of the 1st ray. As best as can be determined the interphalangeal joints and the metatarsophalangeal joints are reasonably well-maintained. The tarsometatarsal joints and intertarsal joints were visualized exhibit no acute abnormalities. There are small plantar and Achilles region calcaneal spurs. There are mild degenerative changes of the tibiotalar joint. The soft tissues demonstrate mild diffuse swelling. There is no soft tissue gas or foreign body demonstrated.  IMPRESSION: There are chronic abnormalities of the left foot but there is no evidence of acute fracture or dislocation nor of acute infectious process. If the patient's symptoms persist and remain unexplained, follow-up MRI may be useful.   Electronically Signed   By: David  [place]   On: 09/18/2013 20:07    EKG Interpretation   None       MDM  No diagnosis found. Admit for IV ABX    Kenyen Candy K Saquoia Sianez-Rasch, MD 09/19/13 4098

## 2013-09-18 NOTE — ED Notes (Signed)
Patient called and no answer. 

## 2013-09-19 ENCOUNTER — Encounter (HOSPITAL_COMMUNITY): Payer: Self-pay | Admitting: *Deleted

## 2013-09-19 ENCOUNTER — Emergency Department (HOSPITAL_COMMUNITY): Payer: Medicaid Other

## 2013-09-19 DIAGNOSIS — L02619 Cutaneous abscess of unspecified foot: Principal | ICD-10-CM

## 2013-09-19 DIAGNOSIS — L03119 Cellulitis of unspecified part of limb: Principal | ICD-10-CM

## 2013-09-19 DIAGNOSIS — L03116 Cellulitis of left lower limb: Secondary | ICD-10-CM | POA: Diagnosis present

## 2013-09-19 MED ORDER — ONDANSETRON HCL 4 MG/2ML IJ SOLN: 4.0000 mg | Freq: Four times a day (QID) | INTRAMUSCULAR | Status: DC | PRN

## 2013-09-19 MED ORDER — THIAMINE HCL 100 MG/ML IJ SOLN
100.0000 mg | Freq: Every day | INTRAMUSCULAR | Status: DC
  Filled 2013-09-19 (×2): qty 1

## 2013-09-19 MED ORDER — ONDANSETRON HCL 4 MG/2ML IJ SOLN
4.0000 mg | INTRAMUSCULAR | Status: AC
  Administered 2013-09-19: 4 mg via INTRAVENOUS
  Filled 2013-09-19: qty 2

## 2013-09-19 MED ORDER — HYDRALAZINE HCL 20 MG/ML IJ SOLN: 5.0000 mg | Freq: Four times a day (QID) | INTRAMUSCULAR | Status: DC | PRN

## 2013-09-19 MED ORDER — LORAZEPAM 1 MG PO TABS: 1.0000 mg | ORAL_TABLET | Freq: Four times a day (QID) | ORAL | Status: AC | PRN

## 2013-09-19 MED ORDER — MORPHINE SULFATE 4 MG/ML IJ SOLN
4.0000 mg | Freq: Once | INTRAMUSCULAR | Status: AC
  Administered 2013-09-19: 4 mg via INTRAVENOUS
  Filled 2013-09-19: qty 1

## 2013-09-19 MED ORDER — SODIUM CHLORIDE 0.9 % IV SOLN
Freq: Once | INTRAVENOUS | Status: AC
  Administered 2013-09-19: 1000 mL via INTRAVENOUS

## 2013-09-19 MED ORDER — VITAMIN B-1 100 MG PO TABS
100.0000 mg | ORAL_TABLET | Freq: Every day | ORAL | Status: DC
  Administered 2013-09-19 – 2013-09-26 (×8): 100 mg via ORAL
  Filled 2013-09-19 (×8): qty 1

## 2013-09-19 MED ORDER — MUPIROCIN CALCIUM 2 % EX CREA
TOPICAL_CREAM | Freq: Two times a day (BID) | CUTANEOUS | Status: DC
  Administered 2013-09-19 – 2013-09-26 (×15): via TOPICAL
  Filled 2013-09-19: qty 15

## 2013-09-19 MED ORDER — NICOTINE 14 MG/24HR TD PT24
14.0000 mg | MEDICATED_PATCH | Freq: Every day | TRANSDERMAL | Status: DC
  Administered 2013-09-19 – 2013-09-25 (×7): 14 mg via TRANSDERMAL
  Filled 2013-09-19 (×8): qty 1

## 2013-09-19 MED ORDER — ADULT MULTIVITAMIN W/MINERALS CH
1.0000 | ORAL_TABLET | Freq: Every day | ORAL | Status: DC
  Administered 2013-09-19 – 2013-09-26 (×8): 1 via ORAL
  Filled 2013-09-19 (×8): qty 1

## 2013-09-19 MED ORDER — SODIUM CHLORIDE 0.9 % IV SOLN: INTRAVENOUS | Status: DC

## 2013-09-19 MED ORDER — HEPARIN SODIUM (PORCINE) 5000 UNIT/ML IJ SOLN
5000.0000 [IU] | Freq: Three times a day (TID) | INTRAMUSCULAR | Status: DC
  Administered 2013-09-19 – 2013-09-26 (×22): 5000 [IU] via SUBCUTANEOUS
  Filled 2013-09-19 (×25): qty 1

## 2013-09-19 MED ORDER — VANCOMYCIN HCL IN DEXTROSE 1-5 GM/200ML-% IV SOLN
1000.0000 mg | Freq: Two times a day (BID) | INTRAVENOUS | Status: DC
  Administered 2013-09-19 – 2013-09-23 (×8): 1000 mg via INTRAVENOUS
  Filled 2013-09-19 (×8): qty 200

## 2013-09-19 MED ORDER — MORPHINE SULFATE 4 MG/ML IJ SOLN
4.0000 mg | INTRAMUSCULAR | Status: DC | PRN
  Administered 2013-09-19 – 2013-09-22 (×11): 4 mg via INTRAVENOUS
  Filled 2013-09-19 (×11): qty 1

## 2013-09-19 MED ORDER — FENTANYL CITRATE 0.05 MG/ML IJ SOLN
100.0000 ug | INTRAMUSCULAR | Status: AC
  Administered 2013-09-19: 100 ug via INTRAVENOUS
  Filled 2013-09-19: qty 2

## 2013-09-19 MED ORDER — LORAZEPAM 2 MG/ML IJ SOLN
1.0000 mg | Freq: Four times a day (QID) | INTRAMUSCULAR | Status: AC | PRN
  Administered 2013-09-20: 1 mg via INTRAVENOUS
  Filled 2013-09-19: qty 1

## 2013-09-19 MED ORDER — TRIAMTERENE-HCTZ 37.5-25 MG PO TABS
1.0000 | ORAL_TABLET | Freq: Every day | ORAL | Status: DC
  Administered 2013-09-19 – 2013-09-26 (×8): 1 via ORAL
  Filled 2013-09-19 (×8): qty 1

## 2013-09-19 MED ORDER — DILTIAZEM HCL ER BEADS 120 MG PO CP24
120.0000 mg | ORAL_CAPSULE | Freq: Every day | ORAL | Status: DC
  Administered 2013-09-19 – 2013-09-24 (×6): 120 mg via ORAL
  Filled 2013-09-19 (×6): qty 1

## 2013-09-19 MED ORDER — FOLIC ACID 1 MG PO TABS
1.0000 mg | ORAL_TABLET | Freq: Every day | ORAL | Status: DC
  Administered 2013-09-19 – 2013-09-26 (×8): 1 mg via ORAL
  Filled 2013-09-19 (×8): qty 1

## 2013-09-19 NOTE — ED Notes (Signed)
Pt reports left foot swelling for several weeks with increased pain and swelling New Year's Day. Went to PCP yesterday and was told to come to the ER for further mgmt.

## 2013-09-19 NOTE — Progress Notes (Signed)
UR complete.  Kimoni Pagliarulo RN, MSN 

## 2013-09-19 NOTE — Progress Notes (Signed)
Pt BP and temperature was high see Doc flow sheet . MD notified and new orders written and noted  Dressing change to left foot per MD order.  Pt medicated for pain will recheck vs later.

## 2013-09-19 NOTE — ED Notes (Signed)
Patient transported to X-ray 

## 2013-09-19 NOTE — ED Notes (Signed)
Report to [place].  Pt to go to floor on stretcher.

## 2013-09-19 NOTE — Consult Note (Addendum)
WOC wound consult note Reason for Consult: Requested to assess left foot.  Pt has generalized edema and erythremia to LLE which includes, toes, foot, and ankle. Skin is tight and shiny. Pt is currently on IV Vancomycin Wound type: Plantar surface of left foot with 1X1cm dry hard callous area, surrounded by 1 cm area of erythremia and raised slightly above skin level.  No open wound, drainage, or fluctuance. Measurement: Left posterior heel with full thickness fissure; 2X.1X.2cm; dark red, dry wound bed, no odor or drainage. Dressing procedure/placement/frequency: There is no open wound at this time to callous area so topical care not needed at this time. Bactroban to posterior heel to provide antimicrobial benefits. X-ray results indicate if no sign of improvement, then consider MRI.  Please re-consult if further assistance is needed.  Thank-you,  Cammie Mcgeeawn Shaliyah Taite MSN, RN, CWOCN, [place], CNS [phone]

## 2013-09-19 NOTE — H&P (Addendum)
[facility] History and Physical  Ronald [name] DOB: [birth year] DOA: 09/18/2013  Referring physician: EDP PCP: No PCP Per Patient   Chief Complaint: Cellulitis of L foot   HPI: Ronald [name] is a 58 y.o. male who presents to the ED with L foot cellulitis.  Symptoms were severe starting on Thursday of last week, and have gotten progressively worse spreading up from his foot to his ankle and lower leg.  Now associated with systemic symptoms including fever, chills, N/V.  He presents to the ED for his symptoms.  Review of Systems: Systems reviewed.  As above, otherwise negative  Past Medical History  Diagnosis Date  . Gout   . Hypertension   . ETOH abuse    Past Surgical History  Procedure Laterality Date  . Leg surgery     Social History:  reports that he has been smoking Cigarettes.  He has been smoking about 0.50 packs per day. He does not have any smokeless tobacco history on file. He reports that he drinks about 72.0 ounces of alcohol per week. He reports that he uses illicit drugs (Cocaine).  No Known Allergies  History reviewed. No pertinent family history.   Prior to Admission medications   Medication Sig Start Date End Date Taking? Authorizing Provider  diltiazem (TIAZAC) 120 MG 24 hr capsule Take 120 mg by mouth daily.   Yes Historical Provider, MD  ibuprofen (ADVIL,MOTRIN) 200 MG tablet Take 400-600 mg by mouth 2 (two) times daily as needed for moderate pain.    Yes Historical Provider, MD  triamterene-hydrochlorothiazide (MAXZIDE-25) 37.5-25 MG per tablet Take 1 tablet by mouth daily.   Yes Historical Provider, MD   Physical Exam: Filed Vitals:   09/19/13 0130  BP: 113/52  Pulse: 96  Temp:   Resp:     BP 113/52  Pulse 96  Temp(Src) 98.3 F (36.8 C) (Oral)  Resp 18  Ht 5\' 4"  (1.626 m)  Wt 81.647 kg (180 lb)  BMI 30.88 kg/m2  SpO2 94%  General Appearance:    Alert, oriented, no distress, appears stated age  Head:    Normocephalic,  atraumatic  Eyes:    PERRL, EOMI, sclera non-icteric        Nose:   Nares without drainage or epistaxis. Mucosa, turbinates normal  Throat:   Moist mucous membranes. Oropharynx without erythema or exudate.  Neck:   Supple. No carotid bruits.  No thyromegaly.  No lymphadenopathy.   Back:     No CVA tenderness, no spinal tenderness  Lungs:     Clear to auscultation bilaterally, without wheezes, rhonchi or rales  Chest wall:    No tenderness to palpitation  Heart:    Regular rate and rhythm without murmurs, gallops, rubs  Abdomen:     Soft, non-tender, nondistended, normal bowel sounds, no organomegaly  Genitalia:    deferred  Rectal:    deferred  Extremities:   No clubbing, cyanosis or edema.  Pulses:   2+ and symmetric all extremities  Skin:   L foot with obvious erythema, edema, TTP, also looks like there is a calus / ulcer on the bottom of his foot as well, unclear that this is the source of cellulitis though as cellulitis appears to be primarily on dorsal surface.  Lymph nodes:   Cervical, supraclavicular, and axillary nodes normal  Neurologic:   CNII-XII intact. Normal strength, sensation and reflexes      throughout    Labs on Admission:  Basic Metabolic Panel:  Recent  Labs Lab 09/18/13 1928  NA 136*  K 3.4*  CL 93*  CO2 23  GLUCOSE 103*  BUN 16  CREATININE 0.65  CALCIUM 9.0   Liver Function Tests:  Recent Labs Lab 09/18/13 1928  AST 32  ALT 23  ALKPHOS 123*  BILITOT 0.4  PROT 8.4*  ALBUMIN 3.9   No results found for this basename: LIPASE, AMYLASE,  in the last 168 hours No results found for this basename: AMMONIA,  in the last 168 hours CBC:  Recent Labs Lab 09/18/13 1928  WBC 16.2*  NEUTROABS 12.3*  HGB 14.4  HCT 39.4  MCV 93.4  PLT 358   Cardiac Enzymes: No results found for this basename: CKTOTAL, CKMB, CKMBINDEX, TROPONINI,  in the last 168 hours  BNP (last 3 results) No results found for this basename: PROBNP,  in the last 8760  hours CBG: No results found for this basename: GLUCAP,  in the last 168 hours  Radiological Exams on Admission: Dg Chest 2 View  09/19/2013   CLINICAL DATA:  Hypertension  EXAM: CHEST  2 VIEW  COMPARISON:  February 24, 2013  FINDINGS: There is no edema or consolidation. Heart size and pulmonary vascularity are normal. No adenopathy. No bone lesions.  IMPRESSION: No edema or consolidation.   Electronically Signed   By: Bretta Bang M.D.   On: 09/19/2013 00:31   Dg Foot Complete Left  09/18/2013   CLINICAL DATA:  Chronic left foot swelling. Six day history of diffuse left foot pain.  EXAM: LEFT FOOT - COMPLETE 3+ VIEW  COMPARISON:  None  FINDINGS: The bones are osteopenic. There is a pes planus type contour on the lateral film. There is a mild hallux valgus type contour of the 1st ray. As best as can be determined the interphalangeal joints and the metatarsophalangeal joints are reasonably well-maintained. The tarsometatarsal joints and intertarsal joints were visualized exhibit no acute abnormalities. There are small plantar and Achilles region calcaneal spurs. There are mild degenerative changes of the tibiotalar joint. The soft tissues demonstrate mild diffuse swelling. There is no soft tissue gas or foreign body demonstrated.  IMPRESSION: There are chronic abnormalities of the left foot but there is no evidence of acute fracture or dislocation nor of acute infectious process. If the patient's symptoms persist and remain unexplained, follow-up MRI may be useful.   Electronically Signed   By: David  [place]   On: 09/18/2013 20:07    EKG: Independently reviewed.  Assessment/Plan Principal Problem:   Cellulitis of left foot   1. Cellulitis of left foot - given systemic symptoms, WBC, will admit patient for IV vancomycin treatment.  Received a dose of zosyn in ED as well.  BC pending.  X ray showed no evidence of bone involvement at this time.  Pain control with morphine PRN. 2. Ongoing EtOH abuse -  on CIWA to prevent withdrawal, no symptoms currently   Code Status: Full Code  Family Communication: no family at bedside Disposition Plan: Admit to inpatient   Time spent: 50 min  GARDNER, JARED M. [facility] Pager [phone]  If 7AM-7PM, please contact the day team taking care of the patient [email or web address] Password [facility] 09/19/2013, 2:39 AM

## 2013-09-19 NOTE — Evaluation (Signed)
Physical Therapy Evaluation Patient Details Name: Ronald [name] MRN: [id number] DOB: 08/23/1956 Today's Date: 09/19/2013 Time: [id number] PT Time Calculation (min): 14 min  PT Assessment / Plan / Recommendation History of Present Illness  pt presents with L foot cellulitis.    Clinical Impression  Pt mildly unsteady 2/2 antalgic gait with L foot pain.  Provided pt with rubber cap for his cane to prevent wooden cane from slipping on floor.  Feel pt will make good progress as pain improves in L foot.  Will continue to follow to ensure safety with mobility.      PT Assessment  Patient needs continued PT services    Follow Up Recommendations  No PT follow up;Supervision - Intermittent    Does the patient have the potential to tolerate intense rehabilitation      Barriers to Discharge        Equipment Recommendations  None recommended by PT    Recommendations for Other Services     Frequency Min 3X/week    Precautions / Restrictions Precautions Precautions: Fall Restrictions Weight Bearing Restrictions: No   Pertinent Vitals/Pain L Foot during ambulation 5-6/10.        Mobility  Transfers Overall transfer level: Modified independent Equipment used:  (Uses armrests.  ) Ambulation/Gait Ambulation/Gait assistance: Min guard Ambulation Distance (Feet): 160 Feet Assistive device: Straight cane Gait Pattern/deviations: Step-to pattern;Decreased step length - right;Decreased stance time - left;Antalgic General Gait Details: pt antalgic on L LE and with rigid gait 2/2 Bil ankle fusions.      Exercises     PT Diagnosis: Difficulty walking  PT Problem List: Decreased activity tolerance;Decreased balance;Decreased mobility;Decreased knowledge of use of DME;Pain PT Treatment Interventions: DME instruction;Stair training;Gait training;Functional mobility training;Therapeutic activities;Therapeutic exercise;Balance training;Patient/family education     PT Goals(Current goals  can be found in the care plan section) Acute Rehab PT Goals Patient Stated Goal: Foot to heal PT Goal Formulation: With patient Time For Goal Achievement: 10/03/13 Potential to Achieve Goals: Good  Visit Information  Last PT Received On: 09/19/13 Assistance Needed: +1 History of Present Illness: pt presents with L foot cellulitis.         Prior Functioning  Home Living Family/patient expects to be discharged to:: Private residence Living Arrangements: Other relatives (Sister) Available Help at Discharge: Family;Available PRN/intermittently Type of Home: House Home Access: Stairs to enter Entergy CorporationEntrance Stairs-Number of Steps: 5 Entrance Stairs-Rails: Right Home Layout: One level Home Equipment: Cane - single point Prior Function Level of Independence: Independent Communication Communication: No difficulties    Cognition  Cognition Arousal/Alertness: Awake/alert Behavior During Therapy: WFL for tasks assessed/performed Overall Cognitive Status: Within Functional Limits for tasks assessed    Extremity/Trunk Assessment Upper Extremity Assessment Upper Extremity Assessment: Overall WFL for tasks assessed Lower Extremity Assessment Lower Extremity Assessment: RLE deficits/detail;LLE deficits/detail RLE Deficits / Details: 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  Unable to state why ankle was fused.   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  Foot now with edema and very painful.     Balance    End of Session PT - End of Session Equipment Utilized During Treatment: Gait belt Activity Tolerance: Patient tolerated treatment well Patient left: in chair;with call bell/phone within reach Nurse Communication: Mobility status  GP     [name], Torris House F, [place] CarolinaPT [phone] 09/19/2013, 2:09 PM

## 2013-09-19 NOTE — Progress Notes (Signed)
ANTIBIOTIC CONSULT NOTE - INITIAL  Pharmacy Consult for vancomycin Indication: cellulitis  No Known Allergies  Patient Measurements: Height: 5\' 4"  (162.6 cm) Weight: 180 lb (81.647 kg) IBW/kg (Calculated) : 59.2  Vital Signs: Temp: 98.3 F (36.8 C) (01/06 1920) Temp src: Oral (01/06 1920) BP: 110/63 mmHg (01/07 0303) Pulse Rate: 100 (01/07 0303)  Labs:  Recent Labs  09/18/13 1928  WBC 16.2*  HGB 14.4  PLT 358  CREATININE 0.65   Estimated Creatinine Clearance: 99.5 ml/min (by C-G formula based on Cr of 0.65).   Microbiology: No results found for this or any previous visit (from the past 720 hour(s)).  Medical History: Past Medical History  Diagnosis Date  . Gout   . Hypertension   . ETOH abuse     Medications:   (Not in a hospital admission) Scheduled:  . diltiazem  120 mg Oral Daily  . folic acid  1 mg Oral Daily  . heparin  5,000 Units Subcutaneous Q8H  . multivitamin with minerals  1 tablet Oral Daily  . thiamine  100 mg Oral Daily   Or  . thiamine  100 mg Intravenous Daily   Infusions:  . sodium chloride      Assessment: 58yo male c/o red, swollen foot since Thursday and progressively worsening, now associated w/ fever, chills, N/V; Xray negative for bone involvement, to begin IV ABX for cellulitis.  Goal of Therapy:  Vancomycin trough level 10-15 mcg/ml  Plan:  Rec'd vanc 1g IV in ED; will continue with vancomycin 1g IV Q12H and monitor CBC, Cx, levels prn.  Vernard GamblesVeronda Laurna Shetley, PharmD, BCPS  09/19/2013,3:15 AM

## 2013-09-19 NOTE — Progress Notes (Signed)
[facility] PROGRESS NOTE  [name]:[id number] DOB: [birth year] DOA: 09/18/2013 PCP: Dr August Saucer  Brief HPI: Ronald [name] is a 58 y.o. male who presents to the ED with L foot cellulitis. Symptoms were severe starting on Thursday of last week, and have gotten progressively worse spreading up from his foot to his ankle and lower leg. Now associated with systemic symptoms including fever, chills, N/V. He was admitted for management of left foot cellulitis with IV vancomycin  Assessment/Plan:  Cellulitis of left foot -  Was started on IV vancomycin.  Received a dose of zosyn in ED as well. BC pending. X ray showed no evidence of bone involvement at this time. Pain control with morphine PRN.   Ongoing EtOH abuse - on CIWA to prevent withdrawal, no symptoms currently  COPD and tobacco abuse Chronic bronchitis. Stable.  Smoking cessation counseling and nicotine patch ordered.   DVT prophylaxis   Code Status: full code Family Communication: none at bedside. Discussed the plan of care with the patient. Disposition Plan: pending PT evaluation.    Consultants:  none  Procedures:  cxr  X ray foot  Antibiotics:  One dose of zosyn in ED  Iv vancomycin 1/6  HPI/Subjective: Pain is improved.   Objective: Filed Vitals:   09/19/13 0953  BP: 135/87  Pulse: 111  Temp: 99.5 F (37.5 C)  Resp: 18    Intake/Output Summary (Last 24 hours) at 09/19/13 1024 Last data filed at 09/19/13 0328  Gross per 24 hour  Intake      0 ml  Output    350 ml  Net   -350 ml   Filed Weights   09/18/13 1920 09/18/13 1922 09/19/13 0630  Weight: 81.647 kg (180 lb) 81.647 kg (180 lb) 82 kg (180 lb 12.4 oz)    Exam:   General:  Alert afebrile comfortable.   Cardiovascular: s1s2  Respiratory: CTAB  Abdomen: soft TN ND BS+  Musculoskeletal: left foot swollen and tender to touch.   Data Reviewed: Basic Metabolic Panel:  Recent Labs Lab 09/18/13 1928  NA 136*  K 3.4*   CL 93*  CO2 23  GLUCOSE 103*  BUN 16  CREATININE 0.65  CALCIUM 9.0   Liver Function Tests:  Recent Labs Lab 09/18/13 1928  AST 32  ALT 23  ALKPHOS 123*  BILITOT 0.4  PROT 8.4*  ALBUMIN 3.9   No results found for this basename: LIPASE, AMYLASE,  in the last 168 hours No results found for this basename: AMMONIA,  in the last 168 hours CBC:  Recent Labs Lab 09/18/13 1928  WBC 16.2*  NEUTROABS 12.3*  HGB 14.4  HCT 39.4  MCV 93.4  PLT 358   Cardiac Enzymes: No results found for this basename: CKTOTAL, CKMB, CKMBINDEX, TROPONINI,  in the last 168 hours BNP (last 3 results) No results found for this basename: PROBNP,  in the last 8760 hours CBG: No results found for this basename: GLUCAP,  in the last 168 hours  No results found for this or any previous visit (from the past 240 hour(s)).   Studies: Dg Chest 2 View  09/19/2013   CLINICAL DATA:  Hypertension  EXAM: CHEST  2 VIEW  COMPARISON:  February 24, 2013  FINDINGS: There is no edema or consolidation. Heart size and pulmonary vascularity are normal. No adenopathy. No bone lesions.  IMPRESSION: No edema or consolidation.   Electronically Signed   By: Bretta Bang M.D.   On: 09/19/2013 00:31   Dg  Foot Complete Left  09/18/2013   CLINICAL DATA:  Chronic left foot swelling. Six day history of diffuse left foot pain.  EXAM: LEFT FOOT - COMPLETE 3+ VIEW  COMPARISON:  None  FINDINGS: The bones are osteopenic. There is a pes planus type contour on the lateral film. There is a mild hallux valgus type contour of the 1st ray. As best as can be determined the interphalangeal joints and the metatarsophalangeal joints are reasonably well-maintained. The tarsometatarsal joints and intertarsal joints were visualized exhibit no acute abnormalities. There are small plantar and Achilles region calcaneal spurs. There are mild degenerative changes of the tibiotalar joint. The soft tissues demonstrate mild diffuse swelling. There is no soft  tissue gas or foreign body demonstrated.  IMPRESSION: There are chronic abnormalities of the left foot but there is no evidence of acute fracture or dislocation nor of acute infectious process. If the patient's symptoms persist and remain unexplained, follow-up MRI may be useful.   Electronically Signed   By: David  [place]   On: 09/18/2013 20:07    Scheduled Meds: . diltiazem  120 mg Oral Daily  . folic acid  1 mg Oral Daily  . heparin  5,000 Units Subcutaneous Q8H  . multivitamin with minerals  1 tablet Oral Daily  . thiamine  100 mg Oral Daily   Or  . thiamine  100 mg Intravenous Daily  . vancomycin  1,000 mg Intravenous Q12H   Continuous Infusions:   Principal Problem:   Cellulitis of left foot    Time spent: 30 minutes   Allyn Bertoni  [facility] Pager [phone]. If 7PM-7AM, please contact night-coverage at [email or web address], password [email or web address] 09/19/2013, 10:24 AM  LOS: 1 day

## 2013-09-20 ENCOUNTER — Inpatient Hospital Stay (HOSPITAL_COMMUNITY): Payer: Medicaid Other

## 2013-09-20 LAB — BASIC METABOLIC PANEL
BUN: 9 mg/dL (ref 6–23)
CO2: 25 mEq/L (ref 19–32)
Calcium: 9.2 mg/dL (ref 8.4–10.5)
Chloride: 96 mEq/L (ref 96–112)
Creatinine, Ser: 0.67 mg/dL (ref 0.50–1.35)
GFR calc Af Amer: >90 mL/min (ref >90)
GLUCOSE: 113 mg/dL — AB (ref 70–99)
POTASSIUM: 3.8 meq/L (ref 3.7–5.3)
Sodium: 137 mEq/L (ref 137–147)

## 2013-09-20 LAB — CBC
HCT: 39 % (ref 39.0–52.0)
HEMOGLOBIN: 13.7 g/dL (ref 13.0–17.0)
MCH: 32.9 pg (ref 26.0–34.0)
MCHC: 35.1 g/dL (ref 30.0–36.0)
MCV: 93.5 fL (ref 78.0–100.0)
Platelets: 392 10*3/uL (ref 150–400)
RBC: 4.17 MIL/uL — AB (ref 4.22–5.81)
RDW: 13 % (ref 11.5–15.5)
WBC: 14.1 10*3/uL — ABNORMAL HIGH (ref 4.0–10.5)

## 2013-09-20 MED ORDER — GADOBENATE DIMEGLUMINE 529 MG/ML IV SOLN
20.0000 mL | Freq: Once | INTRAVENOUS | Status: AC
  Administered 2013-09-20: 18 mL via INTRAVENOUS

## 2013-09-20 NOTE — Progress Notes (Signed)
[facility] PROGRESS NOTE  [name]:[id number] DOB: June 12, 1956 DOA: 09/18/2013 PCP: Dr August Saucer  Brief HPI: Ronald [name] is a 58 y.o. male who presents to the ED with L foot cellulitis. Symptoms were severe starting on Thursday of last week, and have gotten progressively worse spreading up from his foot to his ankle and lower leg. Now associated with systemic symptoms including fever, chills, N/V. He was admitted for management of left foot cellulitis with IV vancomycin  Assessment/Plan:  Cellulitis of left foot -  Was started on IV vancomycin.  Received a dose of zosyn in ED as well. BC pending. X ray showed no evidence of bone involvement at this time. But pt continues to have fever and pain, MRI of the foot ordered.  Pain control with morphine PRN.   Ongoing EtOH abuse - on CIWA to prevent withdrawal, no symptoms currently  COPD and tobacco abuse Chronic bronchitis. Stable.  Smoking cessation counseling and nicotine patch ordered.   DVT prophylaxis   Code Status: full code Family Communication: none at bedside. Discussed the plan of care with the patient. Disposition Plan: pending PT evaluation.    Consultants:  none  Procedures:  cxr  X ray foot  MRI of the foot  Antibiotics:  One dose of zosyn in ED  Iv vancomycin 1/6  HPI/Subjective: Fever overnight, no new complaints.   Objective: Filed Vitals:   09/20/13 0508  BP: 146/91  Pulse: 94  Temp: 98.9 F (37.2 C)  Resp: 18    Intake/Output Summary (Last 24 hours) at 09/20/13 0921 Last data filed at 09/20/13 0509  Gross per 24 hour  Intake    840 ml  Output    800 ml  Net     40 ml   Filed Weights   09/18/13 1920 09/18/13 1922 09/19/13 0630  Weight: 81.647 kg (180 lb) 81.647 kg (180 lb) 82 kg (180 lb 12.4 oz)    Exam:   General:  Alert afebrile comfortable.   Cardiovascular: s1s2  Respiratory: CTAB  Abdomen: soft TN ND BS+  Musculoskeletal: left foot swollen and tender to  touch.   Data Reviewed: Basic Metabolic Panel:  Recent Labs Lab 09/18/13 1928 09/20/13 0357  NA 136* 137  K 3.4* 3.8  CL 93* 96  CO2 23 25  GLUCOSE 103* 113*  BUN 16 9  CREATININE 0.65 0.67  CALCIUM 9.0 9.2   Liver Function Tests:  Recent Labs Lab 09/18/13 1928  AST 32  ALT 23  ALKPHOS 123*  BILITOT 0.4  PROT 8.4*  ALBUMIN 3.9   No results found for this basename: LIPASE, AMYLASE,  in the last 168 hours No results found for this basename: AMMONIA,  in the last 168 hours CBC:  Recent Labs Lab 09/18/13 1928 09/20/13 0357  WBC 16.2* 14.1*  NEUTROABS 12.3*  --   HGB 14.4 13.7  HCT 39.4 39.0  MCV 93.4 93.5  PLT 358 392   Cardiac Enzymes: No results found for this basename: CKTOTAL, CKMB, CKMBINDEX, TROPONINI,  in the last 168 hours BNP (last 3 results) No results found for this basename: PROBNP,  in the last 8760 hours CBG: No results found for this basename: GLUCAP,  in the last 168 hours  Recent Results (from the past 240 hour(s))  CULTURE, BLOOD (ROUTINE X 2)     Status: None   Collection Time    09/19/13 12:10 AM      Result Value Range Status   Specimen Description BLOOD LEFT HAND  Final   Special Requests BOTTLES DRAWN AEROBIC ONLY 3CC   Final   Culture  Setup Time     Final   Value: 09/19/2013 09:16     [facility]   Culture     Final   Value:        BLOOD CULTURE RECEIVED NO GROWTH TO DATE CULTURE WILL BE HELD FOR 5 DAYS BEFORE ISSUING A FINAL NEGATIVE REPORT     [facility]   Report Status PENDING   Incomplete  CULTURE, BLOOD (ROUTINE X 2)     Status: None   Collection Time    09/19/13 12:20 AM      Result Value Range Status   Specimen Description BLOOD RIGHT ARM   Final   Special Requests BOTTLES DRAWN AEROBIC ONLY [facility]   Final   Culture  Setup Time     Final   Value: 09/19/2013 09:16     [facility]   Culture     Final   Value:        BLOOD CULTURE RECEIVED NO GROWTH TO  DATE CULTURE WILL BE HELD FOR 5 DAYS BEFORE ISSUING A FINAL NEGATIVE REPORT     [facility]   Report Status PENDING   Incomplete     Studies: Dg Chest 2 View  09/19/2013   CLINICAL DATA:  Hypertension  EXAM: CHEST  2 VIEW  COMPARISON:  February 24, 2013  FINDINGS: There is no edema or consolidation. Heart size and pulmonary vascularity are normal. No adenopathy. No bone lesions.  IMPRESSION: No edema or consolidation.   Electronically Signed   By: Bretta BangWilliam  Woodruff M.D.   On: 09/19/2013 00:31   Dg Foot Complete Left  09/18/2013   CLINICAL DATA:  Chronic left foot swelling. Six day history of diffuse left foot pain.  EXAM: LEFT FOOT - COMPLETE 3+ VIEW  COMPARISON:  None  FINDINGS: The bones are osteopenic. There is a pes planus type contour on the lateral film. There is a mild hallux valgus type contour of the 1st ray. As best as can be determined the interphalangeal joints and the metatarsophalangeal joints are reasonably well-maintained. The tarsometatarsal joints and intertarsal joints were visualized exhibit no acute abnormalities. There are small plantar and Achilles region calcaneal spurs. There are mild degenerative changes of the tibiotalar joint. The soft tissues demonstrate mild diffuse swelling. There is no soft tissue gas or foreign body demonstrated.  IMPRESSION: There are chronic abnormalities of the left foot but there is no evidence of acute fracture or dislocation nor of acute infectious process. If the patient's symptoms persist and remain unexplained, follow-up MRI may be useful.   Electronically Signed   By: David  [place]   On: 09/18/2013 20:07    Scheduled Meds: . diltiazem  120 mg Oral Daily  . folic acid  1 mg Oral Daily  . heparin  5,000 Units Subcutaneous Q8H  . multivitamin with minerals  1 tablet Oral Daily  . mupirocin cream   Topical BID  . nicotine  14 mg Transdermal Daily  . thiamine  100 mg Oral Daily   Or  . thiamine  100 mg Intravenous Daily  .  triamterene-hydrochlorothiazide  1 tablet Oral Daily  . vancomycin  1,000 mg Intravenous Q12H   Continuous Infusions:   Principal Problem:   Cellulitis of left foot    Time spent: 30 minutes   Rekha Hobbins  [facility] Pager [phone].  If 7PM-7AM, please contact night-coverage at [email or web address], password [facility] 09/20/2013, 9:21 AM  LOS: 2 days

## 2013-09-20 NOTE — Progress Notes (Signed)
Physical Therapy Treatment Patient Details Name: Ronald [name] MRN: [id number] DOB: 08/31/1956 Today's Date: 09/20/2013 Time: [id number] PT Time Calculation (min): 13 min  PT Assessment / Plan / Recommendation  History of Present Illness pt presents with L foot cellulitis.     PT Comments   Pt moving well with improved safety and ambulation distance.  Pt indicates pain in L foot is better today, but still limits activity.  Will continue to follow.    Follow Up Recommendations  No PT follow up;Supervision - Intermittent     Does the patient have the potential to tolerate intense rehabilitation     Barriers to Discharge        Equipment Recommendations  None recommended by PT    Recommendations for Other Services    Frequency Min 3X/week   Progress towards PT Goals Progress towards PT goals: Progressing toward goals  Plan Current plan remains appropriate    Precautions / Restrictions Precautions Precautions: Fall Restrictions Weight Bearing Restrictions: No   Pertinent Vitals/Pain 4/10 L foot.      Mobility  Transfers Overall transfer level: Modified independent Equipment used:  (uses armrests) Ambulation/Gait Ambulation/Gait assistance: Supervision Ambulation Distance (Feet): 180 Feet Assistive device: Straight cane Gait Pattern/deviations: Step-to pattern;Decreased step length - right;Decreased stance time - left;Antalgic General Gait Details: pt antalgic on L LE and with rigid gait 2/2 Bil ankle fusions.      Exercises     PT Diagnosis:    PT Problem List:   PT Treatment Interventions:     PT Goals (current goals can now be found in the care plan section) Acute Rehab PT Goals Patient Stated Goal: Foot to heal Time For Goal Achievement: 10/03/13 Potential to Achieve Goals: Good  Visit Information  Last PT Received On: 09/20/13 Assistance Needed: +1 History of Present Illness: pt presents with L foot cellulitis.      Subjective Data  Patient Stated Goal:  Foot to heal   Cognition  Cognition Arousal/Alertness: Awake/alert Behavior During Therapy: WFL for tasks assessed/performed Overall Cognitive Status: Within Functional Limits for tasks assessed    Balance     End of Session PT - End of Session Equipment Utilized During Treatment: Gait belt Activity Tolerance: Patient limited by pain Patient left: in chair;with call bell/phone within reach Nurse Communication: Mobility status   GP     Ronald [name], Ronald [name], [place] [phone] 09/20/2013, 8:57 AM

## 2013-09-21 DIAGNOSIS — L02612 Cutaneous abscess of left foot: Secondary | ICD-10-CM | POA: Diagnosis present

## 2013-09-21 MED ORDER — PIPERACILLIN-TAZOBACTAM 3.375 G IVPB
3.3750 g | Freq: Three times a day (TID) | INTRAVENOUS | Status: DC
  Administered 2013-09-21 – 2013-09-23 (×6): 3.375 g via INTRAVENOUS
  Filled 2013-09-21 (×9): qty 50

## 2013-09-21 NOTE — Consult Note (Signed)
ORTHOPAEDIC CONSULTATION  REQUESTING PHYSICIAN: Kathlen ModyVijaya Akula, MD  Chief Complaint: Left foot abscess  HPI: Ronald [name] is a 58 y.o. male who complains of left foot abscess for 1 week with abscess found on MRI.  No underlying osteomyelitis.  Currently on vanc/zosyn.  Denies any signs/symptoms of sepsis.  Past Medical History  Diagnosis Date  . Gout   . Hypertension   . ETOH abuse    Past Surgical History  Procedure Laterality Date  . Leg surgery     History   Social History  . Marital Status: Single    Spouse Name: N/A    Number of Children: N/A  . Years of Education: N/A   Social History Main Topics  . Smoking status: Current Every Day Smoker -- 0.50 packs/day    Types: Cigarettes  . Smokeless tobacco: None  . Alcohol Use: 72.0 oz/week    120 Cans of beer per week     Comment: "A whole Bunch - I am a drunk"  . Drug Use: Yes    Special: Cocaine  . Sexual Activity: None   Other Topics Concern  . None   Social History Narrative  . None   History reviewed. No pertinent family history. No Known Allergies Prior to Admission medications   Medication Sig Start Date End Date Taking? Authorizing Provider  diltiazem (TIAZAC) 120 MG 24 hr capsule Take 120 mg by mouth daily.   Yes Historical Provider, MD  ibuprofen (ADVIL,MOTRIN) 200 MG tablet Take 400-600 mg by mouth 2 (two) times daily as needed for moderate pain.    Yes Historical Provider, MD  triamterene-hydrochlorothiazide (MAXZIDE-25) 37.5-25 MG per tablet Take 1 tablet by mouth daily.   Yes Historical Provider, MD   Ronald [name]  09/20/2013   CLINICAL DATA:  fever, swelling and pain, evaluate for osteomyelitis.  EXAM: MRI OF THE LEFT FOREFOOT WITHOUT AND WITH [name]  TECHNIQUE: Multiplanar, multisequence Ronald imaging was performed both before and after administration of intravenous [name].  [name]:  18mL MULTIHANCE GADOBENATE DIMEGLUMINE 529 MG/ML IV SOLN  COMPARISON:  DG FOOT COMPLETE*L* dated  09/18/2013  FINDINGS: Evaluation is limited secondary to patient motion degrading image quality.  There is no focal marrow signal abnormality. There are degenerative changes of the 1st MTP joint. There is no fracture or dislocation.  There is generalized soft tissue edema with enhancement on postcontrast images. There is a focal 1.7 x 3 x 2.1 cm fluid collection along the plantar aspect of the left forefoot at the level of the 3rd metatarsal head. There is no significant enhancement of the fluid collection on postcontrast images. There is no other fluid collection. The visualized flexor and extensor tendons are unremarkable.  IMPRESSION: 1. No evidence of osteomyelitis of the left forefoot. 2. Focal 1.7 x 3 x 2.1 cm fluid collection along the plantar aspect of the left forefoot at the level of the 3rd metatarsal head without significant peripheral enhancement. The fluid collection is concerning for an abscess given the soft tissue findings and clinical exam. 3. Generalized soft tissue edema with enhancement on postcontrast images concerning for cellulitis.   Electronically Signed   By: Elige KoHetal  Patel   On: 09/20/2013 19:16    Positive ROS: All other systems have been reviewed and were otherwise negative with the exception of those mentioned in the HPI and as above.  Physical Exam: General: Alert, no acute distress Cardiovascular: No audible thrills Respiratory: No cyanosis, no use of accessory musculature GI: No  organomegaly, abdomen is soft and non-tender Skin: No lesions in the area of chief complaint Neurologic: Sensation intact distally Psychiatric: Patient is competent for consent with normal mood and affect Lymphatic: No axillary or cervical lymphadenopathy  MUSCULOSKELETAL: Left foot - plantar ulcer under 3rd MT head - fluctuance, induration - dry, no drainage - cellulitis  Assessment: Left foot abscess  Plan: - bedside I&D performed by ortho - cultures taken and sent off - cont  vanc/zosyn and then tailor per cx - elevate, float heels - postop shoe - antibacterial soap foot soaks TID - will follow for clinical improvement - NWB LLE  Thank you for the consult and the opportunity to see Ronald [name]. Glee Arvin, MD [facility] [phone] 1:41 PM

## 2013-09-21 NOTE — Progress Notes (Signed)
ANTIBIOTIC CONSULT NOTE - INITIAL  Pharmacy Consult for Zosyn Indication: L foot abscess  No Known Allergies  Patient Measurements: Height: 5\' 4"  (162.6 cm) Weight: 180 lb 12.4 oz (82 kg) IBW/kg (Calculated) : 59.2 Adjusted Body Weight:    Vital Signs: Temp: 98.7 F (37.1 C) (01/09 0956) Temp src: Oral (01/09 0956) BP: 138/80 mmHg (01/09 0956) Pulse Rate: 103 (01/09 0956) Intake/Output from previous day: 01/08 0701 - 01/09 0700 In: 360 [P.O.:360] Out: -  Intake/Output from this shift:    Labs:  Recent Labs  09/18/13 1928 09/20/13 0357  WBC 16.2* 14.1*  HGB 14.4 13.7  PLT 358 392  CREATININE 0.65 0.67   Estimated Creatinine Clearance: 99.6 ml/min (by C-G formula based on Cr of 0.67). No results found for this basename: VANCOTROUGH, VANCOPEAK, VANCORANDOM, GENTTROUGH, GENTPEAK, GENTRANDOM, TOBRATROUGH, TOBRAPEAK, TOBRARND, AMIKACINPEAK, AMIKACINTROU, AMIKACIN,  in the last 72 hours   Microbiology: Recent Results (from the past 720 hour(s))  CULTURE, BLOOD (ROUTINE X 2)     Status: None   Collection Time    09/19/13 12:10 AM      Result Value Range Status   Specimen Description BLOOD LEFT HAND   Final   Special Requests BOTTLES DRAWN AEROBIC ONLY 3CC   Final   Culture  Setup Time     Final   Value: 09/19/2013 09:16     Performed at [facility]   Culture     Final   Value:        BLOOD CULTURE RECEIVED NO GROWTH TO DATE CULTURE WILL BE HELD FOR 5 DAYS BEFORE ISSUING A FINAL NEGATIVE REPORT     Performed at [facility]   Report Status PENDING   Incomplete  CULTURE, BLOOD (ROUTINE X 2)     Status: None   Collection Time    09/19/13 12:20 AM      Result Value Range Status   Specimen Description BLOOD RIGHT ARM   Final   Special Requests BOTTLES DRAWN AEROBIC ONLY [facility]   Final   Culture  Setup Time     Final   Value: 09/19/2013 09:16     Performed at [facility]   Culture     Final   Value:        BLOOD CULTURE RECEIVED NO GROWTH TO  DATE CULTURE WILL BE HELD FOR 5 DAYS BEFORE ISSUING A FINAL NEGATIVE REPORT     Performed at [facility]   Report Status PENDING   Incomplete  CULTURE, BLOOD (ROUTINE X 2)     Status: None   Collection Time    09/19/13  8:40 PM      Result Value Range Status   Specimen Description BLOOD RIGHT ARM   Final   Special Requests BOTTLES DRAWN AEROBIC ONLY 6CC   Final   Culture  Setup Time     Final   Value: 09/20/2013 01:05     Performed at [facility]   Culture     Final   Value:        BLOOD CULTURE RECEIVED NO GROWTH TO DATE CULTURE WILL BE HELD FOR 5 DAYS BEFORE ISSUING A FINAL NEGATIVE REPORT     Performed at [facility]   Report Status PENDING   Incomplete  CULTURE, BLOOD (ROUTINE X 2)     Status: None   Collection Time    09/19/13  8:50 PM      Result Value Range Status   Specimen Description  BLOOD RIGHT HAND   Final   Special Requests BOTTLES DRAWN AEROBIC ONLY 8CC   Final   Culture  Setup Time     Final   Value: 09/20/2013 01:05     [facility] at [facility]   Culture     Final   Value:        BLOOD CULTURE RECEIVED NO GROWTH TO DATE CULTURE WILL BE HELD FOR 5 DAYS BEFORE ISSUING A FINAL NEGATIVE REPORT     Performed at [facility]   Report Status PENDING   Incomplete    Medical History: Past Medical History  Diagnosis Date  . Gout   . Hypertension   . ETOH abuse     Assessment: 58yo male c/o red, swollen foot since Thursday progressively worsening, now w/ fever, chills, N/V.  Infectious Disease: Looks like foot cellulitis. Empiric Vanco. Tmax 100, WBC 16.2>>14.1. Xray with no evidence of bone involvement. MRI=no evidence of osteo. Vanco dose not charted last PM. Adding Zosyn today. CrCl 99.  1/7: Blood x 4>>pending  1/7 Vanc>> 1/9 Zosyn>>  Goal of Therapy:  Vancomycin trough level 10-15 mcg/ml  Plan:  Vanc 1g IV q12. Would check level but dose may have been missed PM 1/8? Add Zosyn 3.375g IV q8hr   Nicky Milhouse S.  Merilynn Finlandobertson, PharmD, BCPS Clinical Staff Pharmacist Pager [phone]  Misty Stanleyobertson, Bradrick Kamau Stillinger 09/21/2013,12:51 PM

## 2013-09-21 NOTE — Progress Notes (Signed)
Orthopedic Tech Progress Note Patient Details:  [name] Feb 09, 1956 [id number]  Ortho Devices Type of Ortho Device: Postop shoe/boot Ortho Device/Splint Location: lle Ortho Device/Splint Interventions: Application   Ronald [name] 09/21/2013, 2:22 PM

## 2013-09-21 NOTE — Progress Notes (Signed)
[facility] PROGRESS NOTE  [place] Leon[place] ZOX:[id number] DOB: 03/07/1956 DOA: 09/18/2013 PCP: Dr August Saucerean  Brief HPI: Ronald [name] is a 58 y.o. male who presents to the ED with L foot cellulitis. Symptoms were severe starting on Thursday of last week, and have gotten progressively worse spreading up from his foot to his ankle and lower leg. Now associated with systemic symptoms including fever, chills, N/V. He was admitted for management of left foot cellulitis with IV vancomycin  Assessment/Plan:  Cellulitis of left foot -  Was started on IV vancomycin, added zosyn today.  Received a dose of zosyn in ED as well. BC pending. X ray showed no evidence of bone involvement at this time. But pt continues to have fever and pain, MRI of the foot done showed abscess of the plantar aspect of the foot. Ortho consulted and recommended I&d.  Pain control with morphine PRN.   Ongoing EtOH abuse - on CIWA to prevent withdrawal, no symptoms currently  COPD and tobacco abuse Chronic bronchitis. Stable.  Smoking cessation counseling and nicotine patch ordered.   DVT prophylaxis   Code Status: full code Family Communication: none at bedside. Discussed the plan of care with the patient. Disposition Plan: pending PT evaluation.    Consultants:  none  Procedures:  cxr  X ray foot  MRI of the foot  Antibiotics:  One dose of zosyn in ED  Iv vancomycin 1/6  Zosyn 1/9  HPI/Subjective: No fever, pain is improved.   Objective: Filed Vitals:   09/21/13 1334  BP: 155/88  Pulse: 103  Temp: 99.4 F (37.4 C)  Resp: 18    Intake/Output Summary (Last 24 hours) at 09/21/13 1622 Last data filed at 09/21/13 1300  Gross per 24 hour  Intake    660 ml  Output      0 ml  Net    660 ml   Filed Weights   09/18/13 1920 09/18/13 1922 09/19/13 0630  Weight: 81.647 kg (180 lb) 81.647 kg (180 lb) 82 kg (180 lb 12.4 oz)    Exam:   General:  Alert afebrile comfortable.   Cardiovascular:  s1s2  Respiratory: CTAB  Abdomen: soft TN ND BS+  Musculoskeletal: left foot swollen and tender to touch. Wrapped in bandage.   Data Reviewed: Basic Metabolic Panel:  Recent Labs Lab 09/18/13 1928 09/20/13 0357  NA 136* 137  K 3.4* 3.8  CL 93* 96  CO2 23 25  GLUCOSE 103* 113*  BUN 16 9  CREATININE 0.65 0.67  CALCIUM 9.0 9.2   Liver Function Tests:  Recent Labs Lab 09/18/13 1928  AST 32  ALT 23  ALKPHOS 123*  BILITOT 0.4  PROT 8.4*  ALBUMIN 3.9   No results found for this basename: LIPASE, AMYLASE,  in the last 168 hours No results found for this basename: AMMONIA,  in the last 168 hours CBC:  Recent Labs Lab 09/18/13 1928 09/20/13 0357  WBC 16.2* 14.1*  NEUTROABS 12.3*  --   HGB 14.4 13.7  HCT 39.4 39.0  MCV 93.4 93.5  PLT 358 392   Cardiac Enzymes: No results found for this basename: CKTOTAL, CKMB, CKMBINDEX, TROPONINI,  in the last 168 hours BNP (last 3 results) No results found for this basename: PROBNP,  in the last 8760 hours CBG: No results found for this basename: GLUCAP,  in the last 168 hours  Recent Results (from the past 240 hour(s))  CULTURE, BLOOD (ROUTINE X 2)     Status: None  Collection Time    09/19/13 12:10 AM      Result Value Range Status   Specimen Description BLOOD LEFT HAND   Final   Special Requests BOTTLES DRAWN AEROBIC ONLY 3CC   Final   Culture  Setup Time     Final   Value: 09/19/2013 09:16     Performed at [facility]   Culture     Final   Value:        BLOOD CULTURE RECEIVED NO GROWTH TO DATE CULTURE WILL BE HELD FOR 5 DAYS BEFORE ISSUING A FINAL NEGATIVE REPORT     Performed at [facility]   Report Status PENDING   Incomplete  CULTURE, BLOOD (ROUTINE X 2)     Status: None   Collection Time    09/19/13 12:20 AM      Result Value Range Status   Specimen Description BLOOD RIGHT ARM   Final   Special Requests BOTTLES DRAWN AEROBIC ONLY 7CC   Final   Culture  Setup Time     Final   Value:  09/19/2013 09:16     Performed at [facility]   Culture     Final   Value:        BLOOD CULTURE RECEIVED NO GROWTH TO DATE CULTURE WILL BE HELD FOR 5 DAYS BEFORE ISSUING A FINAL NEGATIVE REPORT     Performed at [facility]   Report Status PENDING   Incomplete  CULTURE, BLOOD (ROUTINE X 2)     Status: None   Collection Time    09/19/13  8:40 PM      Result Value Range Status   Specimen Description BLOOD RIGHT ARM   Final   Special Requests BOTTLES DRAWN AEROBIC ONLY 6CC   Final   Culture  Setup Time     Final   Value: 09/20/2013 01:05     Performed at [facility]   Culture     Final   Value:        BLOOD CULTURE RECEIVED NO GROWTH TO DATE CULTURE WILL BE HELD FOR 5 DAYS BEFORE ISSUING A FINAL NEGATIVE REPORT     Performed at [facility]   Report Status PENDING   Incomplete  CULTURE, BLOOD (ROUTINE X 2)     Status: None   Collection Time    09/19/13  8:50 PM      Result Value Range Status   Specimen Description BLOOD RIGHT HAND   Final   Special Requests BOTTLES DRAWN AEROBIC ONLY 8CC   Final   Culture  Setup Time     Final   Value: 09/20/2013 01:05     Performed at [facility]   Culture     Final   Value:        BLOOD CULTURE RECEIVED NO GROWTH TO DATE CULTURE WILL BE HELD FOR 5 DAYS BEFORE ISSUING A FINAL NEGATIVE REPORT     Performed at [facility]   Report Status PENDING   Incomplete     Studies: Mr Foot Left W Wo Contrast  09/20/2013   CLINICAL DATA:  fever, swelling and pain, evaluate for osteomyelitis.  EXAM: MRI OF THE LEFT FOREFOOT WITHOUT AND WITH CONTRAST  TECHNIQUE: Multiplanar, multisequence MR imaging was performed both before and after administration of intravenous contrast.  CONTRAST:  18mL MULTIHANCE GADOBENATE DIMEGLUMINE 529 MG/ML IV SOLN  COMPARISON:  DG FOOT COMPLETE*L* dated 09/18/2013  FINDINGS: Evaluation is limited secondary to patient  motion degrading image quality.  There is no focal marrow signal  abnormality. There are degenerative changes of the 1st MTP joint. There is no fracture or dislocation.  There is generalized soft tissue edema with enhancement on postcontrast images. There is a focal 1.7 x 3 x 2.1 cm fluid collection along the plantar aspect of the left forefoot at the level of the 3rd metatarsal head. There is no significant enhancement of the fluid collection on postcontrast images. There is no other fluid collection. The visualized flexor and extensor tendons are unremarkable.  IMPRESSION: 1. No evidence of osteomyelitis of the left forefoot. 2. Focal 1.7 x 3 x 2.1 cm fluid collection along the plantar aspect of the left forefoot at the level of the 3rd metatarsal head without significant peripheral enhancement. The fluid collection is concerning for an abscess given the soft tissue findings and clinical exam. 3. Generalized soft tissue edema with enhancement on postcontrast images concerning for cellulitis.   Electronically Signed   By: Elige Ko   On: 09/20/2013 19:16    Scheduled Meds: . diltiazem  120 mg Oral Daily  . folic acid  1 mg Oral Daily  . heparin  5,000 Units Subcutaneous Q8H  . multivitamin with minerals  1 tablet Oral Daily  . mupirocin cream   Topical BID  . nicotine  14 mg Transdermal Daily  . piperacillin-tazobactam (ZOSYN)  IV  3.375 g Intravenous Q8H  . thiamine  100 mg Oral Daily  . triamterene-hydrochlorothiazide  1 tablet Oral Daily  . vancomycin  1,000 mg Intravenous Q12H   Continuous Infusions:   Principal Problem:   Cellulitis of left foot    Time spent: 30 minutes   Ronald [name]  [facility] Pager [phone]. If 7PM-7AM, please contact night-coverage at [email or web address], password [facility] 09/21/2013, 4:22 PM  LOS: 3 days

## 2013-09-21 NOTE — Op Note (Signed)
Date of surgery: 09/21/2048  Preoperative diagnosis: Left foot abscess  Postoperative diagnosis: Same  Procedure: Incision and drainage of left foot abscess  Surgeon: Glee ArvinMichael Xu, M.D.  Anesthesia: None  Estimated blood loss: 25 cc  Complications: None  Indications for procedure: Mr. Ronald [name] is a 58 year old gentleman with a history of diabetes and an abscess of his left foot which was found on MRI. The condition was reviewed with the patient. The risks, benefits, and alternatives to the procedure were discussed with the patient and he elected to undergo bedside incision and debridement.  Description of procedure: The left foot was prepped in standard sterile fashion. A Q-tip was used to insert through the draining sinus of the foot. The Q-tip was then used to circumferentially compress the abscess. There was a large amount of purulent material that was expressed from the sinus. The the pus was cultured and sent off to the laboratory. A pair scissors was used to bluntly breakup any loculations. 1 L of sterile saline was irrigated through the wound. A sterile dressing was applied. A postop shoe was ordered. Next  Disposition: The patient will be nonweightbearing to the left lower extremity. He is to elevate his extremity was heels floated. Continue the antibiotics and tailor per the cultures. He will need to have foot soaks 3 times a day with antibacterial soap. I will follow along for clinical improvement.  Mayra ReelN. [name], MD [facility] [phone] 1:49 PM

## 2013-09-22 LAB — CBC
HCT: 37.6 % — ABNORMAL LOW (ref 39.0–52.0)
HEMOGLOBIN: 13.4 g/dL (ref 13.0–17.0)
MCH: 33.1 pg (ref 26.0–34.0)
MCHC: 35.6 g/dL (ref 30.0–36.0)
MCV: 92.8 fL (ref 78.0–100.0)
Platelets: 433 10*3/uL — ABNORMAL HIGH (ref 150–400)
RBC: 4.05 MIL/uL — ABNORMAL LOW (ref 4.22–5.81)
RDW: 12.6 % (ref 11.5–15.5)
WBC: 16.2 10*3/uL — ABNORMAL HIGH (ref 4.0–10.5)

## 2013-09-22 MED ORDER — MORPHINE SULFATE 2 MG/ML IJ SOLN
2.0000 mg | INTRAMUSCULAR | Status: DC | PRN
  Administered 2013-09-22 – 2013-09-23 (×3): 2 mg via INTRAVENOUS
  Filled 2013-09-22 (×3): qty 1

## 2013-09-22 MED ORDER — IBUPROFEN 400 MG PO TABS
400.0000 mg | ORAL_TABLET | Freq: Four times a day (QID) | ORAL | Status: DC | PRN
  Filled 2013-09-22: qty 1

## 2013-09-22 MED ORDER — ACETAMINOPHEN 325 MG PO TABS
650.0000 mg | ORAL_TABLET | ORAL | Status: DC | PRN
  Administered 2013-09-22: 650 mg via ORAL
  Filled 2013-09-22: qty 2

## 2013-09-22 MED ORDER — OXYCODONE HCL 5 MG PO TABS
5.0000 mg | ORAL_TABLET | ORAL | Status: DC | PRN
  Administered 2013-09-22 – 2013-09-26 (×8): 10 mg via ORAL
  Filled 2013-09-22 (×8): qty 2

## 2013-09-22 NOTE — Progress Notes (Signed)
[facility] PROGRESS NOTE  Ronald [name]:[id number] DOB: [birth year] DOA: 09/18/2013 PCP: Dr August Saucer  Brief HPI: Ronald [name] is a 58 y.o. male who presents to the ED with L foot cellulitis. Symptoms were severe starting on Thursday of last week, and have gotten progressively worse spreading up from his foot to his ankle and lower leg. Now associated with systemic symptoms including fever, chills, N/V. He was admitted for management of left foot cellulitis with IV vancomycin  Assessment/Plan:  Cellulitis of left foot -  Was started on IV vancomycin, added zosyn today.  Received a dose of zosyn in ED as well. BC pending. X ray showed no evidence of bone involvement at this time. But pt continues to have fever and pain, MRI of the foot done showed abscess of the plantar aspect of the foot. Ortho consulted and recommended I&d.  Pain control with morphine PRN.   Ongoing EtOH abuse - on CIWA to prevent withdrawal, no symptoms currently  COPD and tobacco abuse Chronic bronchitis. Stable.  Smoking cessation counseling and nicotine patch ordered.   Left arm superficial thrombophlebitis: Venous duplex ordered to evaluate for DVT and the antibiotics should take care of the infection.   DVT prophylaxis   Code Status: full code Family Communication: none at bedside. Discussed the plan of care with the patient. Disposition Plan: pending PT evaluation.    Consultants:  none  Procedures:  cxr  X ray foot  MRI of the foot  Antibiotics:  One dose of zosyn in ED  Iv vancomycin 1/6  Zosyn 1/9  HPI/Subjective: Left IV site, erythematous, swollen and tender since last night. IV line removed and pain medications ordered. Venous duplex ordered.   Objective: Filed Vitals:   09/22/13 0530  BP: 153/60  Pulse: 99  Temp: 99 F (37.2 C)  Resp: 18    Intake/Output Summary (Last 24 hours) at 09/22/13 0946 Last data filed at 09/22/13 0912  Gross per 24 hour  Intake   1260 ml   Output   1100 ml  Net    160 ml   Filed Weights   09/18/13 1920 09/18/13 1922 09/19/13 0630  Weight: 81.647 kg (180 lb) 81.647 kg (180 lb) 82 kg (180 lb 12.4 oz)    Exam:   General:  Alert afebrile comfortable.   Cardiovascular: s1s2  Respiratory: CTAB  Abdomen: soft TN ND BS+  Musculoskeletal: left foot swollen and tender to touch. Wrapped in bandage.   Data Reviewed: Basic Metabolic Panel:  Recent Labs Lab 09/18/13 1928 09/20/13 0357  NA 136* 137  K 3.4* 3.8  CL 93* 96  CO2 23 25  GLUCOSE 103* 113*  BUN 16 9  CREATININE 0.65 0.67  CALCIUM 9.0 9.2   Liver Function Tests:  Recent Labs Lab 09/18/13 1928  AST 32  ALT 23  ALKPHOS 123*  BILITOT 0.4  PROT 8.4*  ALBUMIN 3.9   No results found for this basename: LIPASE, AMYLASE,  in the last 168 hours No results found for this basename: AMMONIA,  in the last 168 hours CBC:  Recent Labs Lab 09/18/13 1928 09/20/13 0357 09/22/13 0540  WBC 16.2* 14.1* 16.2*  NEUTROABS 12.3*  --   --   HGB 14.4 13.7 13.4  HCT 39.4 39.0 37.6*  MCV 93.4 93.5 92.8  PLT 358 392 433*   Cardiac Enzymes: No results found for this basename: CKTOTAL, CKMB, CKMBINDEX, TROPONINI,  in the last 168 hours BNP (last 3 results) No results found for this basename:  PROBNP,  in the last 8760 hours CBG: No results found for this basename: GLUCAP,  in the last 168 hours  Recent Results (from the past 240 hour(s))  CULTURE, BLOOD (ROUTINE X 2)     Status: None   Collection Time    09/19/13 12:10 AM      Result Value Range Status   Specimen Description BLOOD LEFT HAND   Final   Special Requests BOTTLES DRAWN AEROBIC ONLY 3CC   Final   Culture  Setup Time     Final   Value: 09/19/2013 09:16     Performed at [facility]   Culture     Final   Value:        BLOOD CULTURE RECEIVED NO GROWTH TO DATE CULTURE WILL BE HELD FOR 5 DAYS BEFORE ISSUING A FINAL NEGATIVE REPORT     Performed at [facility]   Report Status  PENDING   Incomplete  CULTURE, BLOOD (ROUTINE X 2)     Status: None   Collection Time    09/19/13 12:20 AM      Result Value Range Status   Specimen Description BLOOD RIGHT ARM   Final   Special Requests BOTTLES DRAWN AEROBIC ONLY 7CC   Final   Culture  Setup Time     Final   Value: 09/19/2013 09:16     Performed at [facility]   Culture     Final   Value:        BLOOD CULTURE RECEIVED NO GROWTH TO DATE CULTURE WILL BE HELD FOR 5 DAYS BEFORE ISSUING A FINAL NEGATIVE REPORT     Performed at [facility]   Report Status PENDING   Incomplete  CULTURE, BLOOD (ROUTINE X 2)     Status: None   Collection Time    09/19/13  8:40 PM      Result Value Range Status   Specimen Description BLOOD RIGHT ARM   Final   Special Requests BOTTLES DRAWN AEROBIC ONLY 6CC   Final   Culture  Setup Time     Final   Value: 09/20/2013 01:05     Performed at [facility]   Culture     Final   Value:        BLOOD CULTURE RECEIVED NO GROWTH TO DATE CULTURE WILL BE HELD FOR 5 DAYS BEFORE ISSUING A FINAL NEGATIVE REPORT     Performed at [facility]   Report Status PENDING   Incomplete  CULTURE, BLOOD (ROUTINE X 2)     Status: None   Collection Time    09/19/13  8:50 PM      Result Value Range Status   Specimen Description BLOOD RIGHT HAND   Final   Special Requests BOTTLES DRAWN AEROBIC ONLY 8CC   Final   Culture  Setup Time     Final   Value: 09/20/2013 01:05     Performed at [facility]   Culture     Final   Value:        BLOOD CULTURE RECEIVED NO GROWTH TO DATE CULTURE WILL BE HELD FOR 5 DAYS BEFORE ISSUING A FINAL NEGATIVE REPORT     Performed at [facility]   Report Status PENDING   Incomplete  WOUND CULTURE     Status: None   Collection Time    09/21/13  1:38 PM      Result Value Range Status   Specimen Description WOUND LEFT FOOT  Final   Special Requests NONE   Final   Gram Stain PENDING   Incomplete   Culture     Final   Value: Culture  reincubated for better growth     [facility]   Report Status PENDING   Incomplete     Studies: Mr Foot Left W Wo Contrast  09/20/2013   CLINICAL DATA:  fever, swelling and pain, evaluate for osteomyelitis.  EXAM: MRI OF THE LEFT FOREFOOT WITHOUT AND WITH CONTRAST  TECHNIQUE: Multiplanar, multisequence MR imaging was performed both before and after administration of intravenous contrast.  CONTRAST:  18mL MULTIHANCE GADOBENATE DIMEGLUMINE 529 MG/ML IV SOLN  COMPARISON:  DG FOOT COMPLETE*L* dated 09/18/2013  FINDINGS: Evaluation is limited secondary to patient motion degrading image quality.  There is no focal marrow signal abnormality. There are degenerative changes of the 1st MTP joint. There is no fracture or dislocation.  There is generalized soft tissue edema with enhancement on postcontrast images. There is a focal 1.7 x 3 x 2.1 cm fluid collection along the plantar aspect of the left forefoot at the level of the 3rd metatarsal head. There is no significant enhancement of the fluid collection on postcontrast images. There is no other fluid collection. The visualized flexor and extensor tendons are unremarkable.  IMPRESSION: 1. No evidence of osteomyelitis of the left forefoot. 2. Focal 1.7 x 3 x 2.1 cm fluid collection along the plantar aspect of the left forefoot at the level of the 3rd metatarsal head without significant peripheral enhancement. The fluid collection is concerning for an abscess given the soft tissue findings and clinical exam. 3. Generalized soft tissue edema with enhancement on postcontrast images concerning for cellulitis.   Electronically Signed   By: Elige KoHetal  Patel   On: 09/20/2013 19:16    Scheduled Meds: . diltiazem  120 mg Oral Daily  . folic acid  1 mg Oral Daily  . heparin  5,000 Units Subcutaneous Q8H  . multivitamin with minerals  1 tablet Oral Daily  . mupirocin cream   Topical BID  . nicotine  14 mg Transdermal Daily  . piperacillin-tazobactam  (ZOSYN)  IV  3.375 g Intravenous Q8H  . thiamine  100 mg Oral Daily  . triamterene-hydrochlorothiazide  1 tablet Oral Daily  . vancomycin  1,000 mg Intravenous Q12H   Continuous Infusions:   Principal Problem:   Cellulitis of left foot Active Problems:   Abscess of left foot    Time spent: 30 minutes   Iniya Matzek  [facility] Pager [phone]. If 7PM-7AM, please contact night-coverage at [email or web address], password [email or web address] 09/22/2013, 9:46 AM  LOS: 4 days

## 2013-09-22 NOTE — Progress Notes (Signed)
   Subjective:  Patient reports pain as improved.  Objective:   VITALS:   Filed Vitals:   09/21/13 1334 09/21/13 1751 09/21/13 2136 09/22/13 0530  BP: 155/88 153/94 130/89 153/60  Pulse: 103 103 106 99  Temp: 99.4 F (37.4 C) 99.6 F (37.6 C) 99.9 F (37.7 C) 99 F (37.2 C)  TempSrc: Oral Oral Oral Oral  Resp: 18 18 18 18   Height:      Weight:      SpO2: 98% 100% 99% 100%    Exam improved from yesterday   Lab Results  Component Value Date   WBC 16.2* 09/22/2013   HGB 13.4 09/22/2013   HCT 37.6* 09/22/2013   MCV 92.8 09/22/2013   PLT 433* 09/22/2013     Assessment/Plan:     Problem List Items Addressed This Visit     Other   *Cellulitis of left foot    Other Visit Diagnoses   Cellulitis    -  Primary       NWB LLE Foot soaks TID Follow cx - pending Continue IV abx   Ronald [name], Ronald [name] 09/22/2013, 9:22 AM [phone]

## 2013-09-22 NOTE — Progress Notes (Signed)
Night RN noticed hard swollen area at [facility] where [facility] had [facility].  Heat packs were applied with no response, site is reddened, warm, and painful to the touch.  MD paged. Upper Ext Doppler ordered.

## 2013-09-23 DIAGNOSIS — M7989 Other specified soft tissue disorders: Secondary | ICD-10-CM

## 2013-09-23 LAB — VANCOMYCIN, TROUGH: VANCOMYCIN TR: 7.2 ug/mL — AB (ref 10.0–20.0)

## 2013-09-23 MED ORDER — VANCOMYCIN HCL 10 G IV SOLR
1250.0000 mg | Freq: Two times a day (BID) | INTRAVENOUS | Status: DC
  Administered 2013-09-23: 1250 mg via INTRAVENOUS
  Filled 2013-09-23 (×2): qty 1250

## 2013-09-23 MED ORDER — VANCOMYCIN HCL 10 G IV SOLR
1250.0000 mg | Freq: Two times a day (BID) | INTRAVENOUS | Status: DC
  Administered 2013-09-24 – 2013-09-25 (×2): 1250 mg via INTRAVENOUS
  Filled 2013-09-23 (×4): qty 1250

## 2013-09-23 MED ORDER — PIPERACILLIN-TAZOBACTAM 3.375 G IVPB
3.3750 g | Freq: Three times a day (TID) | INTRAVENOUS | Status: DC
  Administered 2013-09-23 – 2013-09-25 (×5): 3.375 g via INTRAVENOUS
  Filled 2013-09-23 (×7): qty 50

## 2013-09-23 NOTE — Progress Notes (Signed)
Subjective:  Patient reports pain as improved.  Objective:   VITALS:   Filed Vitals:   09/22/13 1800 09/22/13 2123 09/23/13 0515 09/23/13 1029  BP: 152/88 159/67 140/86 166/88  Pulse: 90 102 96 96  Temp: 100.1 F (37.8 C) 101.2 F (38.4 C) 99.2 F (37.3 C) 98.9 F (37.2 C)  TempSrc: Oral Oral Oral Oral  Resp: 18 18 18 20   Height:      Weight:      SpO2: 98% 100% 100% 100%   No purulence Exam improved   Lab Results  Component Value Date   WBC 16.2* 09/22/2013   HGB 13.4 09/22/2013   HCT 37.6* 09/22/2013   MCV 92.8 09/22/2013   PLT 433* 09/22/2013     Assessment/Plan:     Problem List Items Addressed This Visit     Other   *Cellulitis of left foot   Abscess of left foot    Other Visit Diagnoses   Cellulitis    -  Primary       NWB LLE Foot soaks TID Follow cx - pending Continue IV abx   Cheral AlmasXu, Naiping Michael 09/23/2013, 11:00 AM [phone]

## 2013-09-23 NOTE — Progress Notes (Signed)
*  PRELIMINARY RESULTS* Vascular Ultrasound Left upper extremity venous duplex has been completed.  Preliminary findings: No evidence of DVT of the left arm. Superficial thrombosis noted in left basilic vein and a branch at the [facility] fossa.    Farrel DemarkJill Eunice, RDMS, RVT  09/23/2013, 9:33 AM

## 2013-09-23 NOTE — Progress Notes (Signed)
[facility] PROGRESS NOTE  [name] WUJ:[id number] DOB: 01/30/1956 DOA: 09/18/2013 PCP: Dr August Saucer  Brief HPI: Ronald [name] is a 58 y.o. male who presents to the ED with L foot cellulitis. Symptoms were severe starting on Thursday of last week, and have gotten progressively worse spreading up from his foot to his ankle and lower leg. Now associated with systemic symptoms including fever, chills, N/V. He was admitted for management of left foot cellulitis with IV vancomycin  Assessment/Plan:  Cellulitis of left foot -  Was started on IV vancomycin, added zosyn today.  Received a dose of zosyn in ED as well. BC pending. X ray showed no evidence of bone involvement at this time. But pt continues to have fever and pain, MRI of the foot done showed abscess of the plantar aspect of the foot. Ortho consulted and recommended I&d.  Pain control with morphine PRN.   Ongoing EtOH abuse - on CIWA to prevent withdrawal, no symptoms currently  COPD and tobacco abuse Chronic bronchitis. Stable.  Smoking cessation counseling and nicotine patch ordered.   Left arm superficial thrombophlebitis: possibly giving him fever.  Venous duplex ordered to evaluate for DVT, it was negative for DVT, but showed superficial thrombosis.  and the antibiotics should take care of the infection.   DVT prophylaxis   Code Status: full code Family Communication: none at bedside. Discussed the plan of care with the patient. Disposition Plan : pending PT eval.    Consultants:  Orthopedics.   Procedures:  cxr  X ray foot  MRI of the foot  Antibiotics:  One dose of zosyn in ED  Iv vancomycin 1/6  Zosyn 1/9  HPI/Subjective: Left IV site, erythematous, swollen and tender since last night. IV line removed and pain medications ordered. Venous duplex ordered and negative for DVT. He had another fever last night. Blood cultures are negative so far. Wound cultures pending.   Objective: Filed Vitals:    09/23/13 1029  BP: 166/88  Pulse: 96  Temp: 98.9 F (37.2 C)  Resp: 20    Intake/Output Summary (Last 24 hours) at 09/23/13 1723 Last data filed at 09/23/13 0600  Gross per 24 hour  Intake      0 ml  Output    580 ml  Net   -580 ml   Filed Weights   09/18/13 1920 09/18/13 1922 09/19/13 0630  Weight: 81.647 kg (180 lb) 81.647 kg (180 lb) 82 kg (180 lb 12.4 oz)    Exam:   General:  Alert afebrile comfortable.   Cardiovascular: s1s2  Respiratory: CTAB  Abdomen: soft TN ND BS+  Musculoskeletal: left foot swollen and tender to touch. Wrapped in bandage.   Data Reviewed: Basic Metabolic Panel:  Recent Labs Lab 09/18/13 1928 09/20/13 0357  NA 136* 137  K 3.4* 3.8  CL 93* 96  CO2 23 25  GLUCOSE 103* 113*  BUN 16 9  CREATININE 0.65 0.67  CALCIUM 9.0 9.2   Liver Function Tests:  Recent Labs Lab 09/18/13 1928  AST 32  ALT 23  ALKPHOS 123*  BILITOT 0.4  PROT 8.4*  ALBUMIN 3.9   No results found for this basename: LIPASE, AMYLASE,  in the last 168 hours No results found for this basename: AMMONIA,  in the last 168 hours CBC:  Recent Labs Lab 09/18/13 1928 09/20/13 0357 09/22/13 0540  WBC 16.2* 14.1* 16.2*  NEUTROABS 12.3*  --   --   HGB 14.4 13.7 13.4  HCT 39.4 39.0 37.6*  MCV 93.4 93.5 92.8  PLT 358 392 433*   Cardiac Enzymes: No results found for this basename: CKTOTAL, CKMB, CKMBINDEX, TROPONINI,  in the last 168 hours BNP (last 3 results) No results found for this basename: PROBNP,  in the last 8760 hours CBG: No results found for this basename: GLUCAP,  in the last 168 hours  Recent Results (from the past 240 hour(s))  CULTURE, BLOOD (ROUTINE X 2)     Status: None   Collection Time    09/19/13 12:10 AM      Result Value Range Status   Specimen Description BLOOD LEFT HAND   Final   Special Requests BOTTLES DRAWN AEROBIC ONLY 3CC   Final   Culture  Setup Time     Final   Value: 09/19/2013 09:16     Performed at [facility]    Culture     Final   Value:        BLOOD CULTURE RECEIVED NO GROWTH TO DATE CULTURE WILL BE HELD FOR 5 DAYS BEFORE ISSUING A FINAL NEGATIVE REPORT     Performed at [facility]   Report Status PENDING   Incomplete  CULTURE, BLOOD (ROUTINE X 2)     Status: None   Collection Time    09/19/13 12:20 AM      Result Value Range Status   Specimen Description BLOOD RIGHT ARM   Final   Special Requests BOTTLES DRAWN AEROBIC ONLY 7CC   Final   Culture  Setup Time     Final   Value: 09/19/2013 09:16     Performed at [facility]   Culture     Final   Value:        BLOOD CULTURE RECEIVED NO GROWTH TO DATE CULTURE WILL BE HELD FOR 5 DAYS BEFORE ISSUING A FINAL NEGATIVE REPORT     Performed at [facility]   Report Status PENDING   Incomplete  CULTURE, BLOOD (ROUTINE X 2)     Status: None   Collection Time    09/19/13  8:40 PM      Result Value Range Status   Specimen Description BLOOD RIGHT ARM   Final   Special Requests BOTTLES DRAWN AEROBIC ONLY 6CC   Final   Culture  Setup Time     Final   Value: 09/20/2013 01:05     Performed at [facility]   Culture     Final   Value:        BLOOD CULTURE RECEIVED NO GROWTH TO DATE CULTURE WILL BE HELD FOR 5 DAYS BEFORE ISSUING A FINAL NEGATIVE REPORT     Performed at [facility]   Report Status PENDING   Incomplete  CULTURE, BLOOD (ROUTINE X 2)     Status: None   Collection Time    09/19/13  8:50 PM      Result Value Range Status   Specimen Description BLOOD RIGHT HAND   Final   Special Requests BOTTLES DRAWN AEROBIC ONLY 8CC   Final   Culture  Setup Time     Final   Value: 09/20/2013 01:05     Performed at [facility]   Culture     Final   Value:        BLOOD CULTURE RECEIVED NO GROWTH TO DATE CULTURE WILL BE HELD FOR 5 DAYS BEFORE ISSUING A FINAL NEGATIVE REPORT     Performed at [facility]   Report Status PENDING  Incomplete  AFB CULTURE WITH SMEAR     Status: None   Collection Time     09/21/13  1:38 PM      Result Value Range Status   Specimen Description WOUND LEFT FOOT   Final   Special Requests NONE   Final   ACID FAST SMEAR     Final   Value: NO ACID FAST BACILLI SEEN     [facility]   Culture     Final   Value: CULTURE WILL BE EXAMINED FOR 6 WEEKS BEFORE ISSUING A FINAL REPORT     [facility]   Report Status PENDING   Incomplete  WOUND CULTURE     Status: None   Collection Time    09/21/13  1:38 PM      Result Value Range Status   Specimen Description WOUND LEFT FOOT   Final   Special Requests NONE   Final   Gram Stain     Final   Value: FEW WBC PRESENT,BOTH PMN AND MONONUCLEAR     NO SQUAMOUS EPITHELIAL CELLS SEEN     NO ORGANISMS SEEN     [facility]   Culture     Final   Value: Culture reincubated for better growth     [facility]   Report Status PENDING   Incomplete     Studies: No results found.  Scheduled Meds: . diltiazem  120 mg Oral Daily  . folic acid  1 mg Oral Daily  . heparin  5,000 Units Subcutaneous Q8H  . multivitamin with minerals  1 tablet Oral Daily  . mupirocin cream   Topical BID  . nicotine  14 mg Transdermal Daily  . piperacillin-tazobactam (ZOSYN)  IV  3.375 g Intravenous Q8H  . thiamine  100 mg Oral Daily  . triamterene-hydrochlorothiazide  1 tablet Oral Daily  . vancomycin  1,250 mg Intravenous Q12H   Continuous Infusions:   Principal Problem:   Cellulitis of left foot Active Problems:   Abscess of left foot    Time spent: 30 minutes   Ronald [name]  [facility] Pager [phone]. If 7PM-7AM, please contact night-coverage at [email or web address], password [email or web address] 09/23/2013, 5:23 PM  LOS: 5 days

## 2013-09-23 NOTE — Progress Notes (Signed)
ANTIBIOTIC CONSULT NOTE - FOLLOW UP  Pharmacy Consult for Vancomycin  Indication: Cellulitis of left foot  No Known Allergies  Patient Measurements: Height: 5\' 4"  (162.6 cm) Weight: 180 lb 12.4 oz (82 kg) IBW/kg (Calculated) : 59.2  Vital Signs: Temp: 101.2 F (38.4 C) (01/10 2123) Temp src: Oral (01/10 2123) BP: 159/67 mmHg (01/10 2123) Pulse Rate: 102 (01/10 2123) Intake/Output from previous day: 01/10 0701 - 01/11 0700 In: 760 [P.O.:760] Out: 300 [Urine:300] Intake/Output from this shift:    Labs:  Recent Labs  09/20/13 0357 09/22/13 0540  WBC 14.1* 16.2*  HGB 13.7 13.4  PLT 392 433*  CREATININE 0.67  --    Estimated Creatinine Clearance: 99.6 ml/min (by C-G formula based on Cr of 0.67).  Recent Labs  09/23/13 0013  VANCOTROUGH 7.2*     Microbiology: Recent Results (from the past 720 hour(s))  CULTURE, BLOOD (ROUTINE X 2)     Status: None   Collection Time    09/19/13 12:10 AM      Result Value Range Status   Specimen Description BLOOD LEFT HAND   Final   Special Requests BOTTLES DRAWN AEROBIC ONLY 3CC   Final   Culture  Setup Time     Final   Value: 09/19/2013 09:16     [facility]   Culture     Final   Value:        BLOOD CULTURE RECEIVED NO GROWTH TO DATE CULTURE WILL BE HELD FOR 5 DAYS BEFORE ISSUING A FINAL NEGATIVE REPORT     [facility]   Report Status PENDING   Incomplete  CULTURE, BLOOD (ROUTINE X 2)     Status: None   Collection Time    09/19/13 12:20 AM      Result Value Range Status   Specimen Description BLOOD RIGHT ARM   Final   Special Requests BOTTLES DRAWN AEROBIC ONLY [facility]7CC   Final   Culture  Setup Time     Final   Value: 09/19/2013 09:16     [facility]   Culture     Final   Value:        BLOOD CULTURE RECEIVED NO GROWTH TO DATE CULTURE WILL BE HELD FOR 5 DAYS BEFORE ISSUING A FINAL NEGATIVE REPORT     [facility]   Report Status PENDING    Incomplete  CULTURE, BLOOD (ROUTINE X 2)     Status: None   Collection Time    09/19/13  8:40 PM      Result Value Range Status   Specimen Description BLOOD RIGHT ARM   Final   Special Requests BOTTLES DRAWN AEROBIC ONLY 6CC   Final   Culture  Setup Time     Final   Value: 09/20/2013 01:05     [facility]   Culture     Final   Value:        BLOOD CULTURE RECEIVED NO GROWTH TO DATE CULTURE WILL BE HELD FOR 5 DAYS BEFORE ISSUING A FINAL NEGATIVE REPORT     [facility]   Report Status PENDING   Incomplete  CULTURE, BLOOD (ROUTINE X 2)     Status: None   Collection Time    09/19/13  8:50 PM      Result Value Range Status   Specimen Description BLOOD RIGHT HAND   Final   Special Requests BOTTLES DRAWN AEROBIC  ONLY 8CC   Final   Culture  Setup Time     Final   Value: 09/20/2013 01:05     Performed at [facility]   Culture     Final   Value:        BLOOD CULTURE RECEIVED NO GROWTH TO DATE CULTURE WILL BE HELD FOR 5 DAYS BEFORE ISSUING A FINAL NEGATIVE REPORT     Performed at [facility]   Report Status PENDING   Incomplete  AFB CULTURE WITH SMEAR     Status: None   Collection Time    09/21/13  1:38 PM      Result Value Range Status   Specimen Description WOUND LEFT FOOT   Final   Special Requests NONE   Final   ACID FAST SMEAR     Final   Value: NO ACID FAST BACILLI SEEN     Performed at [facility]   Culture     Final   Value: CULTURE WILL BE EXAMINED FOR 6 WEEKS BEFORE ISSUING A FINAL REPORT     Performed at [facility]   Report Status PENDING   Incomplete  WOUND CULTURE     Status: None   Collection Time    09/21/13  1:38 PM      Result Value Range Status   Specimen Description WOUND LEFT FOOT   Final   Special Requests NONE   Final   Gram Stain     Final   Value: FEW WBC PRESENT,BOTH PMN AND MONONUCLEAR     NO SQUAMOUS EPITHELIAL CELLS SEEN     NO ORGANISMS SEEN     Performed at [facility]   Culture     Final   Value: Culture reincubated for better growth     Performed at [facility]   Report Status PENDING   Incomplete    Anti-infectives   Start     Dose/Rate Route Frequency Ordered Stop   09/21/13 1400  piperacillin-tazobactam (ZOSYN) IVPB 3.375 g     3.375 g 12.5 mL/hr over 240 Minutes Intravenous 3 times per day 09/21/13 1251     09/19/13 1200  vancomycin (VANCOCIN) IVPB 1000 mg/200 mL premix     1,000 mg 200 mL/hr over 60 Minutes Intravenous Every 12 hours 09/19/13 0322     09/19/13 0000  vancomycin (VANCOCIN) IVPB 1000 mg/200 mL premix     1,000 mg 200 mL/hr over 60 Minutes Intravenous  Once 09/18/13 2358 09/19/13 0228   09/19/13 0000  piperacillin-tazobactam (ZOSYN) IVPB 3.375 g     3.375 g 100 mL/hr over 30 Minutes Intravenous  Once 09/18/13 2358 09/19/13 0056      Assessment: 58 y/o M on vanco/zosyn for cellulitis of left foot (no osteo per imaging). Vancomycin trough is 7.2. Other labs as above.   Goal of Therapy:  Vancomycin trough level 10-15 mcg/ml  Plan:  -Increase vancomycin to 1250 mg IV q12h -Re-check trough as indicated   Abran Duke 09/23/2013,1:35 AM

## 2013-09-24 DIAGNOSIS — L0390 Cellulitis, unspecified: Secondary | ICD-10-CM

## 2013-09-24 DIAGNOSIS — L0291 Cutaneous abscess, unspecified: Secondary | ICD-10-CM

## 2013-09-24 LAB — CBC
HCT: 37.4 % — ABNORMAL LOW (ref 39.0–52.0)
Hemoglobin: 12.5 g/dL — ABNORMAL LOW (ref 13.0–17.0)
MCH: 31.2 pg (ref 26.0–34.0)
MCHC: 33.4 g/dL (ref 30.0–36.0)
MCV: 93.3 fL (ref 78.0–100.0)
PLATELETS: 473 10*3/uL — AB (ref 150–400)
RBC: 4.01 MIL/uL — ABNORMAL LOW (ref 4.22–5.81)
RDW: 12.7 % (ref 11.5–15.5)
WBC: 14.6 10*3/uL — AB (ref 4.0–10.5)

## 2013-09-24 LAB — WOUND CULTURE

## 2013-09-24 MED ORDER — DILTIAZEM HCL ER COATED BEADS 120 MG PO CP24
120.0000 mg | ORAL_CAPSULE | Freq: Once | ORAL | Status: AC
  Administered 2013-09-24: 120 mg via ORAL
  Filled 2013-09-24: qty 1

## 2013-09-24 MED ORDER — HYDRALAZINE HCL 25 MG PO TABS
25.0000 mg | ORAL_TABLET | Freq: Three times a day (TID) | ORAL | Status: DC
  Administered 2013-09-24 – 2013-09-26 (×7): 25 mg via ORAL
  Filled 2013-09-24 (×9): qty 1

## 2013-09-24 MED ORDER — DILTIAZEM HCL ER BEADS 240 MG PO CP24
240.0000 mg | ORAL_CAPSULE | Freq: Every day | ORAL | Status: DC
  Administered 2013-09-25 – 2013-09-26 (×2): 240 mg via ORAL
  Filled 2013-09-24 (×2): qty 1

## 2013-09-24 MED ORDER — HYDRALAZINE HCL 25 MG PO TABS: 25.0000 mg | ORAL_TABLET | Freq: Three times a day (TID) | ORAL | Status: DC

## 2013-09-24 NOTE — Progress Notes (Signed)
Physical Therapy Treatment Patient Details Name: Ronald [name] MRN: [id number] DOB: 17-Apr-1956 Today's Date: 09/24/2013 Time: [id number] PT Time Calculation (min): 18 min  PT Assessment / Plan / Recommendation  History of Present Illness pt presents with L foot cellulitis.  Now s/p I+D of foot abscess.     PT Comments   Pt very unsteady and difficulty with mobility while maintaining NWBing on L LE.  At this time pt is not safe to return to home and would benefit from ST-SNF for continued rehab.  Discussed with pt elevating LEs 2/2 edema, however pt states LEs more painful when elevated and requests leaving them down.  Will continue to follow.    Follow Up Recommendations  SNF     Does the patient have the potential to tolerate intense rehabilitation     Barriers to Discharge        Equipment Recommendations  None recommended by PT    Recommendations for Other Services OT consult  Frequency Min 3X/week   Progress towards PT Goals Progress towards PT goals: Not progressing toward goals - comment  Plan Discharge plan needs to be updated    Precautions / Restrictions Precautions Precautions: Fall Restrictions Weight Bearing Restrictions: Yes LLE Weight Bearing: Non weight bearing   Pertinent Vitals/Pain 6-7/10 L LE with mobility.      Mobility  Transfers Overall transfer level: Needs assistance Equipment used: Rolling walker (2 wheeled) Transfers: Sit to/from Stand Sit to Stand: Min assist General transfer comment: pt unsteady and needs cues for UE use.  Maintains good NWBing on L LE.   Ambulation/Gait Ambulation/Gait assistance: Min assist Ambulation Distance (Feet): 15 Feet Assistive device: Rolling walker (2 wheeled) Gait Pattern/deviations: Step-to pattern General Gait Details: pt very unsteady with NWBing on L LE and had 2 LOB during 15' of ambulation.  pt fatigues quickly and indicates painful R LE from having to hop on R LE.      Exercises     PT Diagnosis:     PT Problem List:   PT Treatment Interventions:     PT Goals (current goals can now be found in the care plan section) Acute Rehab PT Goals Patient Stated Goal: Foot to heal Time For Goal Achievement: 10/03/13 Potential to Achieve Goals: Good  Visit Information  Last PT Received On: 09/24/13 Assistance Needed: +1 History of Present Illness: pt presents with L foot cellulitis.  Now s/p I+D of foot abscess.      Subjective Data  Subjective: "My feet are both so swollen."   Patient Stated Goal: Foot to heal   Cognition  Cognition Arousal/Alertness: Awake/alert Behavior During Therapy: WFL for tasks assessed/performed Overall Cognitive Status: Within Functional Limits for tasks assessed    Balance  Balance Overall balance assessment: Needs assistance Standing balance support: Bilateral upper extremity supported Standing balance-Leahy Scale: Poor  End of Session PT - End of Session Equipment Utilized During Treatment: Gait belt Activity Tolerance: Patient limited by fatigue;Patient limited by pain Patient left: in chair;with call bell/phone within reach Nurse Communication: Mobility status   GP     [name], Ronald [name], [place]PT [phone] 09/24/2013, 9:06 AM

## 2013-09-24 NOTE — Progress Notes (Signed)
[facility] PROGRESS NOTE  [name] ZOX:[id number] DOB: 03/01/1956 DOA: 09/18/2013 PCP: Dr August Saucerean  Brief HPI: Ronald [name] is a 58 y.o. male who presents to the ED with L foot cellulitis. Symptoms were severe starting on Thursday of last week, and have gotten progressively worse spreading up from his foot to his ankle and lower leg. Now associated with systemic symptoms including fever, chills, N/V. He was admitted for management of left foot cellulitis with IV vancomycin  Assessment/Plan:  Cellulitis of left foot -  Was started on IV vancomycin, added zosyn today.  Received a dose of zosyn in ED as well. BC pending. X ray showed no evidence of bone involvement at this time. But pt continues to have fever and pain, MRI of the foot done showed abscess of the plantar aspect of the foot. Ortho consulted and recommended I&d.  Pain control with morphine PRN.   Ongoing EtOH abuse - on CIWA to prevent withdrawal, no symptoms currently  COPD and tobacco abuse Chronic bronchitis. Stable.  Smoking cessation counseling and nicotine patch ordered.   Left arm superficial thrombophlebitis: possibly giving him fever.  Venous duplex ordered to evaluate for DVT, it was negative for DVT, but showed superficial thrombosis.  and the antibiotics should take care of the infection.   Uncontrolled hypertension: started him on anti hypertensives.   DVT prophylaxis   Code Status: full code Family Communication: none at bedside. Discussed the plan of care with the patient. Disposition Plan : pending PT eval.    Consultants:  Orthopedics.   Procedures:  cxr  X ray foot  MRI of the foot  Antibiotics:  One dose of zosyn in ED  Iv vancomycin 1/6  Zosyn 1/9  HPI/Subjective: Comfortable. No new complaints.    Objective: Filed Vitals:   09/24/13 1351  BP: 163/86  Pulse: 103  Temp: 99.4 F (37.4 C)  Resp: 20    Intake/Output Summary (Last 24 hours) at 09/24/13 2126 Last data  filed at 09/24/13 1848  Gross per 24 hour  Intake    120 ml  Output   2245 ml  Net  -2125 ml   Filed Weights   09/18/13 1920 09/18/13 1922 09/19/13 0630  Weight: 81.647 kg (180 lb) 81.647 kg (180 lb) 82 kg (180 lb 12.4 oz)    Exam:   General:  Alert afebrile comfortable.   Cardiovascular: s1s2  Respiratory: CTAB  Abdomen: soft TN ND BS+  Musculoskeletal: left foot swollen and tender to touch. Wrapped in bandage.   Data Reviewed: Basic Metabolic Panel:  Recent Labs Lab 09/18/13 1928 09/20/13 0357  NA 136* 137  K 3.4* 3.8  CL 93* 96  CO2 23 25  GLUCOSE 103* 113*  BUN 16 9  CREATININE 0.65 0.67  CALCIUM 9.0 9.2   Liver Function Tests:  Recent Labs Lab 09/18/13 1928  AST 32  ALT 23  ALKPHOS 123*  BILITOT 0.4  PROT 8.4*  ALBUMIN 3.9   No results found for this basename: LIPASE, AMYLASE,  in the last 168 hours No results found for this basename: AMMONIA,  in the last 168 hours CBC:  Recent Labs Lab 09/18/13 1928 09/20/13 0357 09/22/13 0540 09/24/13 0345  WBC 16.2* 14.1* 16.2* 14.6*  NEUTROABS 12.3*  --   --   --   HGB 14.4 13.7 13.4 12.5*  HCT 39.4 39.0 37.6* 37.4*  MCV 93.4 93.5 92.8 93.3  PLT 358 392 433* 473*   Cardiac Enzymes: No results found for this basename:  CKTOTAL, CKMB, CKMBINDEX, TROPONINI,  in the last 168 hours BNP (last 3 results) No results found for this basename: PROBNP,  in the last 8760 hours CBG: No results found for this basename: GLUCAP,  in the last 168 hours  Recent Results (from the past 240 hour(s))  CULTURE, BLOOD (ROUTINE X 2)     Status: None   Collection Time    09/19/13 12:10 AM      Result Value Range Status   Specimen Description BLOOD LEFT HAND   Final   Special Requests BOTTLES DRAWN AEROBIC ONLY 3CC   Final   Culture  Setup Time     Final   Value: 09/19/2013 09:16     Performed at [facility]   Culture     Final   Value:        BLOOD CULTURE RECEIVED NO GROWTH TO DATE CULTURE WILL BE HELD  FOR 5 DAYS BEFORE ISSUING A FINAL NEGATIVE REPORT     Performed at [facility]   Report Status PENDING   Incomplete  CULTURE, BLOOD (ROUTINE X 2)     Status: None   Collection Time    09/19/13 12:20 AM      Result Value Range Status   Specimen Description BLOOD RIGHT ARM   Final   Special Requests BOTTLES DRAWN AEROBIC ONLY 7CC   Final   Culture  Setup Time     Final   Value: 09/19/2013 09:16     Performed at [facility]   Culture     Final   Value:        BLOOD CULTURE RECEIVED NO GROWTH TO DATE CULTURE WILL BE HELD FOR 5 DAYS BEFORE ISSUING A FINAL NEGATIVE REPORT     Performed at [facility]   Report Status PENDING   Incomplete  CULTURE, BLOOD (ROUTINE X 2)     Status: None   Collection Time    09/19/13  8:40 PM      Result Value Range Status   Specimen Description BLOOD RIGHT ARM   Final   Special Requests BOTTLES DRAWN AEROBIC ONLY 6CC   Final   Culture  Setup Time     Final   Value: 09/20/2013 01:05     Performed at [facility]   Culture     Final   Value:        BLOOD CULTURE RECEIVED NO GROWTH TO DATE CULTURE WILL BE HELD FOR 5 DAYS BEFORE ISSUING A FINAL NEGATIVE REPORT     Performed at [facility]   Report Status PENDING   Incomplete  CULTURE, BLOOD (ROUTINE X 2)     Status: None   Collection Time    09/19/13  8:50 PM      Result Value Range Status   Specimen Description BLOOD RIGHT HAND   Final   Special Requests BOTTLES DRAWN AEROBIC ONLY 8CC   Final   Culture  Setup Time     Final   Value: 09/20/2013 01:05     Performed at [facility]   Culture     Final   Value:        BLOOD CULTURE RECEIVED NO GROWTH TO DATE CULTURE WILL BE HELD FOR 5 DAYS BEFORE ISSUING A FINAL NEGATIVE REPORT     Performed at [facility]   Report Status PENDING   Incomplete  AFB CULTURE WITH SMEAR     Status: None   Collection Time  09/21/13  1:38 PM      Result Value Range Status   Specimen Description WOUND LEFT FOOT    Final   Special Requests NONE   Final   ACID FAST SMEAR     Final   Value: NO ACID FAST BACILLI SEEN     Performed at [facility]   Culture     Final   Value: CULTURE WILL BE EXAMINED FOR 6 WEEKS BEFORE ISSUING A FINAL REPORT     Performed at [facility]   Report Status PENDING   Incomplete  WOUND CULTURE     Status: None   Collection Time    09/21/13  1:38 PM      Result Value Range Status   Specimen Description WOUND LEFT FOOT   Final   Special Requests NONE   Final   Gram Stain     Final   Value: FEW WBC PRESENT,BOTH PMN AND MONONUCLEAR     NO SQUAMOUS EPITHELIAL CELLS SEEN     NO ORGANISMS SEEN     Performed at [facility]   Culture     Final   Value: MULTIPLE ORGANISMS PRESENT, NONE PREDOMINANT     Note: NO STAPHYLOCOCCUS AUREUS ISOLATED NO GROUP A STREP (S.PYOGENES) ISOLATED     Performed at [facility]   Report Status 09/24/2013 FINAL   Final     Studies: No results found.  Scheduled Meds: . [START ON 09/25/2013] diltiazem  240 mg Oral Daily  . folic acid  1 mg Oral Daily  . heparin  5,000 Units Subcutaneous Q8H  . hydrALAZINE  25 mg Oral Q8H  . multivitamin with minerals  1 tablet Oral Daily  . mupirocin cream   Topical BID  . nicotine  14 mg Transdermal Daily  . piperacillin-tazobactam (ZOSYN)  IV  3.375 g Intravenous Q8H  . thiamine  100 mg Oral Daily  . triamterene-hydrochlorothiazide  1 tablet Oral Daily  . vancomycin  1,250 mg Intravenous Q12H   Continuous Infusions:   Principal Problem:   Cellulitis of left foot Active Problems:   Abscess of left foot    Time spent: 30 minutes   Khaza Blansett  [facility] Pager [phone]. If 7PM-7AM, please contact night-coverage at [email or web address], password [facility] 09/24/2013, 9:26 PM  LOS: 6 days

## 2013-09-24 NOTE — Progress Notes (Addendum)
Foot care done and dressing changed as ordered. Will continue to monitor.

## 2013-09-25 DIAGNOSIS — I10 Essential (primary) hypertension: Secondary | ICD-10-CM | POA: Diagnosis present

## 2013-09-25 DIAGNOSIS — E876 Hypokalemia: Secondary | ICD-10-CM | POA: Diagnosis present

## 2013-09-25 LAB — BASIC METABOLIC PANEL
BUN: 14 mg/dL (ref 6–23)
CHLORIDE: 93 meq/L — AB (ref 96–112)
CO2: 26 meq/L (ref 19–32)
Calcium: 9.5 mg/dL (ref 8.4–10.5)
Creatinine, Ser: 0.81 mg/dL (ref 0.50–1.35)
GFR calc Af Amer: >90 mL/min (ref >90)
GFR calc non Af Amer: >90 mL/min (ref >90)
GLUCOSE: 128 mg/dL — AB (ref 70–99)
POTASSIUM: 3.6 meq/L — AB (ref 3.7–5.3)
SODIUM: 136 meq/L — AB (ref 137–147)

## 2013-09-25 LAB — CBC
HCT: 37.5 % — ABNORMAL LOW (ref 39.0–52.0)
HEMOGLOBIN: 13.6 g/dL (ref 13.0–17.0)
MCH: 33.8 pg (ref 26.0–34.0)
MCHC: 36.3 g/dL — ABNORMAL HIGH (ref 30.0–36.0)
MCV: 93.3 fL (ref 78.0–100.0)
Platelets: 479 10*3/uL — ABNORMAL HIGH (ref 150–400)
RBC: 4.02 MIL/uL — AB (ref 4.22–5.81)
RDW: 12.6 % (ref 11.5–15.5)
WBC: 14.1 10*3/uL — ABNORMAL HIGH (ref 4.0–10.5)

## 2013-09-25 LAB — CULTURE, BLOOD (ROUTINE X 2)
CULTURE: NO GROWTH
Culture: NO GROWTH

## 2013-09-25 MED ORDER — OXYCODONE HCL 5 MG PO TABS: 5.0000 mg | ORAL_TABLET | ORAL | Status: DC | PRN

## 2013-09-25 MED ORDER — CLINDAMYCIN HCL 300 MG PO CAPS
300.0000 mg | ORAL_CAPSULE | Freq: Three times a day (TID) | ORAL | Status: DC
  Administered 2013-09-25 – 2013-09-26 (×4): 300 mg via ORAL
  Filled 2013-09-25 (×6): qty 1

## 2013-09-25 MED ORDER — ADULT MULTIVITAMIN W/MINERALS CH: 1.0000 | ORAL_TABLET | Freq: Every day | ORAL | Status: DC

## 2013-09-25 MED ORDER — NICOTINE 14 MG/24HR TD PT24: 14.0000 mg | MEDICATED_PATCH | Freq: Every day | TRANSDERMAL | Status: DC

## 2013-09-25 MED ORDER — CLINDAMYCIN HCL 300 MG PO CAPS: 300.0000 mg | ORAL_CAPSULE | Freq: Three times a day (TID) | ORAL | Status: DC

## 2013-09-25 MED ORDER — HYDRALAZINE HCL 25 MG PO TABS: 25.0000 mg | ORAL_TABLET | Freq: Three times a day (TID) | ORAL | Status: DC

## 2013-09-25 MED ORDER — THIAMINE HCL 100 MG PO TABS: 100.0000 mg | ORAL_TABLET | Freq: Every day | ORAL | Status: DC

## 2013-09-25 MED ORDER — DILTIAZEM HCL ER BEADS 240 MG PO CP24: 240.0000 mg | ORAL_CAPSULE | Freq: Every day | ORAL | Status: DC

## 2013-09-25 MED ORDER — POTASSIUM CHLORIDE CRYS ER 20 MEQ PO TBCR
40.0000 meq | EXTENDED_RELEASE_TABLET | Freq: Two times a day (BID) | ORAL | Status: AC
  Administered 2013-09-25 (×2): 40 meq via ORAL
  Filled 2013-09-25 (×2): qty 2

## 2013-09-25 MED ORDER — FOLIC ACID 1 MG PO TABS: 1.0000 mg | ORAL_TABLET | Freq: Every day | ORAL | Status: DC

## 2013-09-25 NOTE — Discharge Summary (Signed)
Physician Discharge Summary  [facility] AVW:[id number] DOB: 08-10-1956 DOA: 09/18/2013  PCP: No PCP Per Patient  Admit date: 09/18/2013 Discharge date: 09/25/2013  Time spent: 30 minutes  Recommendations for Outpatient Follow-up:  1. Follow up with pcp in one week 2. Follow up with orthopedics as recommended  Discharge Diagnoses:  Principal Problem:   Cellulitis of left foot Active Problems:   Abscess of left foot accelerated hypertension.  Alcohol abuse Tobacco use   Discharge Condition: improved.   Diet recommendation: LOW SODIUM diet  Filed Weights   09/18/13 1920 09/18/13 1922 09/19/13 0630  Weight: 81.647 kg (180 lb) 81.647 kg (180 lb) 82 kg (180 lb 12.4 oz)    History of present illness:  Ronald [name] is a 58 y.o. male who presents to the ED with L foot cellulitis. Symptoms were severe starting on Thursday of last week, and have gotten progressively worse spreading up from his foot to his ankle and lower leg. Now associated with systemic symptoms including fever, chills, N/V. He was admitted for management of left foot cellulitis with IV vancomycin AND iv ZOSYN. MRI of the foot showed abscess of the left foot, . Ortho called and he underwent I&d, and is NWB on the LLE. PT recommended SNF . Currently awaiting bed at SNF.    Hospital Course:  Cellulitis and abscess of left foot - he was initially started on IV vancomycin, added zosyn on 1/9.  Received a dose of zosyn in ED as well. BC negative so far.  X ray showed no evidence of bone involvement at this time. But when he continued to have fever and pain, MRI of the foot done, it  showed abscess of the plantar aspect of the foot. Ortho consulted and recommended I&d. He underwent i&D of the left foot . Wound cultures have been negative. He has completed 7 days of IV vancomycin and 5  Days of IV zosyn. We have de escalated antibiotics to oral clindamycin to complete the course. PT evaluation recommended SNF and social worker  on board to facilitate that. He can be discharged to SNF when bed available. He is recommended to follow up with Dr Roda Shutters in 1 to 2 weeks on discharge.    Ongoing EtOH abuse   on CIWA to prevent withdrawal, no symptoms currently   COPD and tobacco abuse  Chronic bronchitis. Stable.  Smoking cessation counseling and nicotine patch ordered.   Left arm superficial thrombophlebitis: improving.  Venous duplex ordered to evaluate for DVT, it was negative for DVT, but showed superficial thrombosis. and the antibiotics should take care of the infection.   Uncontrolled hypertension: started him on hydralazine and increased his cardizem to 240 mg daily.    Procedures:  MRI of the left foot.   Consultations:  Orthopedics consult  Physical therapy.  Discharge Exam: Filed Vitals:   09/25/13 0639  BP: 127/74  Pulse: 90  Temp: 99.1 F (37.3 C)  Resp: 20   General: Alert afebrile comfortable.  Cardiovascular: s1s2  Respiratory: CTAB  Abdomen: soft NT ND BS+  Musculoskeletal: swelling and tenderness of the left foot much improved, foot still wrapped in bandage.   Discharge Instructions     Medication List    STOP taking these medications       ibuprofen 200 MG tablet  Commonly known as:  ADVIL,MOTRIN      TAKE these medications       clindamycin 300 MG capsule  Commonly known as:  CLEOCIN  Take 1 capsule (  300 mg total) by mouth every 8 (eight) hours.     diltiazem 240 MG 24 hr capsule  Commonly known as:  TIAZAC  Take 1 capsule (240 mg total) by mouth daily.     folic acid 1 MG tablet  Commonly known as:  FOLVITE  Take 1 tablet (1 mg total) by mouth daily.     hydrALAZINE 25 MG tablet  Commonly known as:  APRESOLINE  Take 1 tablet (25 mg total) by mouth every 8 (eight) hours.     multivitamin with minerals Tabs tablet  Take 1 tablet by mouth daily.     nicotine 14 mg/24hr patch  Commonly known as:  NICODERM CQ - dosed in mg/24 hours  Place 1 patch (14 mg total)  onto the skin daily.     oxyCODONE 5 MG immediate release tablet  Commonly known as:  Oxy IR/ROXICODONE  Take 1-2 tablets (5-10 mg total) by mouth every 3 (three) hours as needed for severe pain.     thiamine 100 MG tablet  Take 1 tablet (100 mg total) by mouth daily.     triamterene-hydrochlorothiazide 37.5-25 MG per tablet  Commonly known as:  MAXZIDE-25  Take 1 tablet by mouth daily.       No Known Allergies Follow-up Information   Follow up with No PCP Per Patient.   Specialty:  General Practice   Contact information:   [address] [place] [address] [phone]        The results of significant diagnostics from this hospitalization (including imaging, microbiology, ancillary and laboratory) are listed below for reference.    Significant Diagnostic Studies: Dg Chest 2 View  09/19/2013   CLINICAL DATA:  Hypertension  EXAM: CHEST  2 VIEW  COMPARISON:  February 24, 2013  FINDINGS: There is no edema or consolidation. Heart size and pulmonary vascularity are normal. No adenopathy. No bone lesions.  IMPRESSION: No edema or consolidation.   Electronically Signed   By: Bretta BangWilliam  Woodruff M.D.   On: 09/19/2013 00:31   Mr Foot Left W Wo Contrast  09/20/2013   CLINICAL DATA:  fever, swelling and pain, evaluate for osteomyelitis.  EXAM: MRI OF THE LEFT FOREFOOT WITHOUT AND WITH CONTRAST  TECHNIQUE: Multiplanar, multisequence MR imaging was performed both before and after administration of intravenous contrast.  CONTRAST:  18mL MULTIHANCE GADOBENATE DIMEGLUMINE 529 MG/ML IV SOLN  COMPARISON:  DG FOOT COMPLETE*L* dated 09/18/2013  FINDINGS: Evaluation is limited secondary to patient motion degrading image quality.  There is no focal marrow signal abnormality. There are degenerative changes of the 1st MTP joint. There is no fracture or dislocation.  There is generalized soft tissue edema with enhancement on postcontrast images. There is a focal 1.7 x 3 x 2.1 cm fluid collection along the  plantar aspect of the left forefoot at the level of the 3rd metatarsal head. There is no significant enhancement of the fluid collection on postcontrast images. There is no other fluid collection. The visualized flexor and extensor tendons are unremarkable.  IMPRESSION: 1. No evidence of osteomyelitis of the left forefoot. 2. Focal 1.7 x 3 x 2.1 cm fluid collection along the plantar aspect of the left forefoot at the level of the 3rd metatarsal head without significant peripheral enhancement. The fluid collection is concerning for an abscess given the soft tissue findings and clinical exam. 3. Generalized soft tissue edema with enhancement on postcontrast images concerning for cellulitis.   Electronically Signed   By: Elige KoHetal  Patel  On: 09/20/2013 19:16   Dg Foot Complete Left  09/18/2013   CLINICAL DATA:  Chronic left foot swelling. Six day history of diffuse left foot pain.  EXAM: LEFT FOOT - COMPLETE 3+ VIEW  COMPARISON:  None  FINDINGS: The bones are osteopenic. There is a pes planus type contour on the lateral film. There is a mild hallux valgus type contour of the 1st ray. As best as can be determined the interphalangeal joints and the metatarsophalangeal joints are reasonably well-maintained. The tarsometatarsal joints and intertarsal joints were visualized exhibit no acute abnormalities. There are small plantar and Achilles region calcaneal spurs. There are mild degenerative changes of the tibiotalar joint. The soft tissues demonstrate mild diffuse swelling. There is no soft tissue gas or foreign body demonstrated.  IMPRESSION: There are chronic abnormalities of the left foot but there is no evidence of acute fracture or dislocation nor of acute infectious process. If the patient's symptoms persist and remain unexplained, follow-up MRI may be useful.   Electronically Signed   By: David  [place]   On: 09/18/2013 20:07    Microbiology: Recent Results (from the past 240 hour(s))  CULTURE, BLOOD (ROUTINE X  2)     Status: None   Collection Time    09/19/13 12:10 AM      Result Value Range Status   Specimen Description BLOOD LEFT HAND   Final   Special Requests BOTTLES DRAWN AEROBIC ONLY 3CC   Final   Culture  Setup Time     Final   Value: 09/19/2013 09:16     Performed at [facility]   Culture     Final   Value: NO GROWTH 5 DAYS     Performed at [facility]   Report Status 09/25/2013 FINAL   Final  CULTURE, BLOOD (ROUTINE X 2)     Status: None   Collection Time    09/19/13 12:20 AM      Result Value Range Status   Specimen Description BLOOD RIGHT ARM   Final   Special Requests BOTTLES DRAWN AEROBIC ONLY [facility]   Final   Culture  Setup Time     Final   Value: 09/19/2013 09:16     Performed at [facility]   Culture     Final   Value: NO GROWTH 5 DAYS     Performed at [facility]   Report Status 09/25/2013 FINAL   Final  CULTURE, BLOOD (ROUTINE X 2)     Status: None   Collection Time    09/19/13  8:40 PM      Result Value Range Status   Specimen Description BLOOD RIGHT ARM   Final   Special Requests BOTTLES DRAWN AEROBIC ONLY 6CC   Final   Culture  Setup Time     Final   Value: 09/20/2013 01:05     Performed at [facility]   Culture     Final   Value:        BLOOD CULTURE RECEIVED NO GROWTH TO DATE CULTURE WILL BE HELD FOR 5 DAYS BEFORE ISSUING A FINAL NEGATIVE REPORT     Performed at [facility]   Report Status PENDING   Incomplete  CULTURE, BLOOD (ROUTINE X 2)     Status: None   Collection Time    09/19/13  8:50 PM      Result Value Range Status   Specimen Description BLOOD RIGHT HAND   Final   Special Requests BOTTLES  DRAWN AEROBIC ONLY 8CC   Final   Culture  Setup Time     Final   Value: 09/20/2013 01:05     Performed at [facility]   Culture     Final   Value:        BLOOD CULTURE RECEIVED NO GROWTH TO DATE CULTURE WILL BE HELD FOR 5 DAYS BEFORE ISSUING A FINAL NEGATIVE REPORT     Performed at [facility]   Report Status PENDING   Incomplete  AFB CULTURE WITH SMEAR     Status: None   Collection Time    09/21/13  1:38 PM      Result Value Range Status   Specimen Description WOUND LEFT FOOT   Final   Special Requests NONE   Final   ACID FAST SMEAR     Final   Value: NO ACID FAST BACILLI SEEN     Performed at [facility]   Culture     Final   Value: CULTURE WILL BE EXAMINED FOR 6 WEEKS BEFORE ISSUING A FINAL REPORT     Performed at [facility]   Report Status PENDING   Incomplete  WOUND CULTURE     Status: None   Collection Time    09/21/13  1:38 PM      Result Value Range Status   Specimen Description WOUND LEFT FOOT   Final   Special Requests NONE   Final   Gram Stain     Final   Value: FEW WBC PRESENT,BOTH PMN AND MONONUCLEAR     NO SQUAMOUS EPITHELIAL CELLS SEEN     NO ORGANISMS SEEN     Performed at [facility]   Culture     Final   Value: MULTIPLE ORGANISMS PRESENT, NONE PREDOMINANT     Note: NO STAPHYLOCOCCUS AUREUS ISOLATED NO GROUP A STREP (S.PYOGENES) ISOLATED     Performed at [facility]   Report Status 09/24/2013 FINAL   Final     Labs: Basic Metabolic Panel:  Recent Labs Lab 09/18/13 1928 09/20/13 0357  NA 136* 137  K 3.4* 3.8  CL 93* 96  CO2 23 25  GLUCOSE 103* 113*  BUN 16 9  CREATININE 0.65 0.67  CALCIUM 9.0 9.2   Liver Function Tests:  Recent Labs Lab 09/18/13 1928  AST 32  ALT 23  ALKPHOS 123*  BILITOT 0.4  PROT 8.4*  ALBUMIN 3.9   No results found for this basename: LIPASE, AMYLASE,  in the last 168 hours No results found for this basename: AMMONIA,  in the last 168 hours CBC:  Recent Labs Lab 09/18/13 1928 09/20/13 0357 09/22/13 0540 09/24/13 0345 09/25/13 0842  WBC 16.2* 14.1* 16.2* 14.6* 14.1*  NEUTROABS 12.3*  --   --   --   --   HGB 14.4 13.7 13.4 12.5* 13.6  HCT 39.4 39.0 37.6* 37.4* 37.5*  MCV 93.4 93.5 92.8 93.3 93.3  PLT 358 392 433* 473* 479*   Cardiac Enzymes: No  results found for this basename: CKTOTAL, CKMB, CKMBINDEX, TROPONINI,  in the last 168 hours BNP: BNP (last 3 results) No results found for this basename: PROBNP,  in the last 8760 hours CBG: No results found for this basename: GLUCAP,  in the last 168 hours     Signed:  Martavius Lusty  [facility] 09/25/2013, 9:52 AM

## 2013-09-25 NOTE — Progress Notes (Signed)
Clinical Social Work Department CLINICAL SOCIAL WORK PLACEMENT NOTE 09/25/2013  Patient:  Ronald [name],Bradshaw  Account Number:  [id number] Admit date:  09/18/2013  Clinical Social Worker:  Leron CroakASSANDRA Garrette Caine, CLINICAL SOCIAL WORKER  Date/time:  09/25/2013 03:57 PM  Clinical Social Work is seeking post-discharge placement for this patient at the following level of care:   SKILLED NURSING   (*CSW will update this form in Epic as items are completed)   09/25/2013  [facility]/family provided with [facility] of Clinical Social Work's list of facilities offering this level of care within the geographic area requested by the patient (or if unable, by the patient's family).  09/25/2013  Patient/family informed of their freedom to choose among providers that offer the needed level of care, that participate in Medicare, Medicaid or managed care program needed by the patient, have an available bed and are willing to accept the patient.  09/25/2013  Patient/family informed of [facility]' ownership interest in [facility], as well as of the fact that they are under no obligation to receive care at this facility.  PASARR submitted to EDS on  PASARR number received from EDS on   FL2 transmitted to all facilities in geographic area requested by pt/family on  09/25/2013 FL2 transmitted to all facilities within larger geographic area on 09/25/2013  Patient informed that his/her managed care company has contracts with or will negotiate with  certain facilities, including the following:     Patient/family informed of bed offers received:   Patient chooses bed at  Physician recommends and patient chooses bed at    Patient to be transferred to  on   Patient to be transferred to facility by   The following physician request were entered in Epic:   Additional Comments:    Lenord Carboassandra Lyall Faciane LCSWA  [facility]  4N 1[address]16;  6N1-16 Phone: [phone]

## 2013-09-25 NOTE — Progress Notes (Signed)
Clinical Social Work Department BRIEF PSYCHOSOCIAL ASSESSMENT 09/25/2013  Patient:  Ronald [name], Ronald [name]     Account Number:  [id number]     Admit date:  09/18/2013  Clinical Social Worker:  Pete Pelt, CLINICAL SOCIAL WORKER  Date/Time:  09/25/2013 03:46 PM  Referred by:  Physician  Date Referred:  09/25/2013 Referred for  SNF Placement   Other Referral:   Interview type:  Patient Other interview type:    PSYCHOSOCIAL DATA Living Status:  [place] Admitted from facility:   Level of care:   Primary support name:  Raylyn Speckman  [phone] Primary support relationship to patient:  [place] Degree of support available:   Pt has minimal support at home and will need a SNF for recovery and wound care.    CURRENT CONCERNS Current Concerns  Post-Acute Placement   Other Concerns:    SOCIAL WORK ASSESSMENT / PLAN CSW met with the Pt for d/c planning. Pt is alert and oriented x4. CSW introduced self and explained that a consult was placed for the Pt to receive PT at a SNF facility. Pt stated that he was not aware of the consult, however was agreeable to placement in [facility] area. Pt does have concerns about which facility and stated he "has never been admitted into a rehab facility." Pt would like the search to remain in the [place] area. CSW will fax Pt information to [facility] area for SNF placement.   Assessment/plan status:  Information/Referral to [employer] Other assessment/ plan:   Information/referral to community resources:   CSW provided a SNF listing to Pt for review and facility choice in the [place] area.    PATIENT'S/FAMILY'S RESPONSE TO PLAN OF CARE: Pt has concerns about staying in the local area so that his family would be able to visit. CSW assured Pt that Pt information would only be sent to [facility] at this time. CSW will continue to work with the Pt for d/c planning to SNF facility.     [facility]  4N 1[address]16;  [phone] Phone: [phone]

## 2013-09-25 NOTE — Progress Notes (Signed)
[facility]. Facility is looking at Pt information and will contact CSW back in the a.m for a determination of acceptance for placement and give a bed offer.   CSW will continue to follow Pt for d/c planning.    Ronald [name] Fread [facility]  [facility]  4N 1[address]16;  [phone] Phone: [phone]

## 2013-09-26 DIAGNOSIS — I10 Essential (primary) hypertension: Secondary | ICD-10-CM

## 2013-09-26 DIAGNOSIS — E876 Hypokalemia: Secondary | ICD-10-CM

## 2013-09-26 LAB — CULTURE, BLOOD (ROUTINE X 2)
Culture: NO GROWTH
Culture: NO GROWTH

## 2013-09-26 MED ORDER — OXYCODONE HCL 5 MG PO TABS: 5.0000 mg | ORAL_TABLET | ORAL | Status: DC | PRN

## 2013-09-26 MED ORDER — DILTIAZEM HCL ER BEADS 240 MG PO CP24: 240.0000 mg | ORAL_CAPSULE | Freq: Every day | ORAL | Status: DC

## 2013-09-26 MED ORDER — HYDRALAZINE HCL 25 MG PO TABS: 25.0000 mg | ORAL_TABLET | Freq: Three times a day (TID) | ORAL | Status: DC

## 2013-09-26 MED ORDER — TRIAMTERENE-HCTZ 37.5-25 MG PO TABS: 1.0000 | ORAL_TABLET | Freq: Every day | ORAL | Status: DC

## 2013-09-26 NOTE — Care Management Note (Unsigned)
    Page 1 of 2   09/26/2013     3:33:43 PM   CARE MANAGEMENT NOTE 09/26/2013  Patient:  Ronald [name], Ronald [name]   Account Number:  [id number]  Date Initiated:  09/24/2013  Documentation initiated by:  Lorne Skeens  Subjective/Objective Assessment:   Patient admitted with Left foot cellulitis.  [place] at home with family     Action/Plan:   Will follow for discharge needs.   Anticipated DC Date:     Anticipated DC Plan:  SKILLED NURSING FACILITY  In-house referral  Clinical Social Worker      DC Planning Services  CM consult      Choice offered to / List presented to:  C-1 Patient        [place] arranged  HH-1 RN  [place].   Status of service:  Completed, signed off Medicare Important Message given?   (If response is "NO", the following Medicare IM given date fields will be blank) Date Medicare IM given:   Date Additional Medicare IM given:    Discharge Disposition:  [place]  Per UR Regulation:    If discussed at Long Length of Stay Meetings, dates discussed:    Comments:  09/26/13  [place] RN, MSN, CM- Recieved information from [place] that patient has chosen to go home with home health instead of SNF as recommended. CM met with patient to discuss home health at discharge.  Patient is agreeable and has chosen [facility].  Voicemail was left for Lelan Pons with [facility] regarding referral.  Patient will be eligiable for [facility] RN and CSW services, but will be unable to recieved HHPT/OT due to Medicaid guidelines.  Dr Grandville Silos is aware.  Patient's address and phone number were verified in the Epic system.  Patient states that he is followed by [facility] located on [facility].

## 2013-09-26 NOTE — Progress Notes (Signed)
PT Cancellation Note  Patient Details Name: Ronald [name] MRN: [id number] DOB: 08/10/1956   Cancelled Treatment:    Reason Eval/Treat Not Completed: Patient declined, no reason specified.  Will check back another time.     Ronald [name], Ronald [name] 09/26/2013, 1:54 PM

## 2013-09-26 NOTE — Progress Notes (Signed)
[facility] PROGRESS NOTE  [name]:[id number] DOB: 1956/07/05 DOA: 09/18/2013 PCP: Dr August Saucer  Brief HPI: Ronald [name] is a 58 y.o. male who presents to the ED with L foot cellulitis. Symptoms were severe starting on Thursday of last week, and have gotten progressively worse spreading up from his foot to his ankle and lower leg. Now associated with systemic symptoms including fever, chills, N/V. He was admitted for management of left foot cellulitis with IV vancomycin  Assessment/Plan:  Cellulitis of left foot -  Was started on IV vancomycin, and zosyn.  Received a dose of zosyn in ED as well. BC pending. X ray showed no evidence of bone involvement at this time. But pt continues to have fever and pain, MRI of the foot done showed abscess of the plantar aspect of the foot. Ortho consulted and recommended I&d.  Pain control with morphine PRN. Patient started on oral clindamycin to complete course. Outpatient follow up.  Ongoing EtOH abuse - on CIWA to prevent withdrawal, asymptomatic  COPD and tobacco abuse Chronic bronchitis. Stable.  Smoking cessation counseling and nicotine patch ordered.   Left arm superficial thrombophlebitis: possibly giving him fever.  Venous duplex ordered to evaluate for DVT, it was negative for DVT, but showed superficial thrombosis.  and the antibiotics should take care of the infection.   Uncontrolled hypertension: Continue current meds of diltiazem, hydralazine. F/U outpatient.  DVT prophylaxis   Code Status: full code Family Communication: none at bedside. Discussed the plan of care with the patient. Disposition Plan : Discharge to SNF when bed available.   Consultants:  Orthopedics.   Procedures:  cxr  X ray foot  MRI of the foot  Antibiotics:  One dose of zosyn in ED  Iv vancomycin 1/6  Zosyn 1/9  Clindamycin 09/25/13  HPI/Subjective: Comfortable. No new complaints.    Objective: Filed Vitals:   09/26/13 0953   BP: 147/84  Pulse: 89  Temp: 98.3 F (36.8 C)  Resp: 20    Intake/Output Summary (Last 24 hours) at 09/26/13 1115 Last data filed at 09/26/13 0954  Gross per 24 hour  Intake    360 ml  Output   1200 ml  Net   -840 ml   Filed Weights   09/18/13 1920 09/18/13 1922 09/19/13 0630  Weight: 81.647 kg (180 lb) 81.647 kg (180 lb) 82 kg (180 lb 12.4 oz)    Exam:   General:  Alert afebrile comfortable.   Cardiovascular: s1s2  Respiratory: CTAB  Abdomen: soft TN ND BS+  Musculoskeletal: left foot swollen and tender to touch. Wrapped in bandage.   Data Reviewed: Basic Metabolic Panel:  Recent Labs Lab 09/20/13 0357 09/25/13 0842  NA 137 136*  K 3.8 3.6*  CL 96 93*  CO2 25 26  GLUCOSE 113* 128*  BUN 9 14  CREATININE 0.67 0.81  CALCIUM 9.2 9.5   Liver Function Tests: No results found for this basename: AST, ALT, ALKPHOS, BILITOT, PROT, ALBUMIN,  in the last 168 hours No results found for this basename: LIPASE, AMYLASE,  in the last 168 hours No results found for this basename: AMMONIA,  in the last 168 hours CBC:  Recent Labs Lab 09/20/13 0357 09/22/13 0540 09/24/13 0345 09/25/13 0842  WBC 14.1* 16.2* 14.6* 14.1*  HGB 13.7 13.4 12.5* 13.6  HCT 39.0 37.6* 37.4* 37.5*  MCV 93.5 92.8 93.3 93.3  PLT 392 433* 473* 479*   Cardiac Enzymes: No results found for this basename: CKTOTAL, CKMB, CKMBINDEX, TROPONINI,  in the last 168 hours BNP (last 3 results) No results found for this basename: PROBNP,  in the last 8760 hours CBG: No results found for this basename: GLUCAP,  in the last 168 hours  Recent Results (from the past 240 hour(s))  CULTURE, BLOOD (ROUTINE X 2)     Status: None   Collection Time    09/19/13 12:10 AM      Result Value Range Status   Specimen Description BLOOD LEFT HAND   Final   Special Requests BOTTLES DRAWN AEROBIC ONLY 3CC   Final   Culture  Setup Time     Final   Value: 09/19/2013 09:16     Performed at [facility]    Culture     Final   Value: NO GROWTH 5 DAYS     Performed at [facility]   Report Status 09/25/2013 FINAL   Final  CULTURE, BLOOD (ROUTINE X 2)     Status: None   Collection Time    09/19/13 12:20 AM      Result Value Range Status   Specimen Description BLOOD RIGHT ARM   Final   Special Requests BOTTLES DRAWN AEROBIC ONLY [facility]   Final   Culture  Setup Time     Final   Value: 09/19/2013 09:16     Performed at [facility]   Culture     Final   Value: NO GROWTH 5 DAYS     Performed at [facility]   Report Status 09/25/2013 FINAL   Final  CULTURE, BLOOD (ROUTINE X 2)     Status: None   Collection Time    09/19/13  8:40 PM      Result Value Range Status   Specimen Description BLOOD RIGHT ARM   Final   Special Requests BOTTLES DRAWN AEROBIC ONLY 6CC   Final   Culture  Setup Time     Final   Value: 09/20/2013 01:05     Performed at [facility]   Culture     Final   Value: NO GROWTH 5 DAYS     Performed at [facility]   Report Status 09/26/2013 FINAL   Final  CULTURE, BLOOD (ROUTINE X 2)     Status: None   Collection Time    09/19/13  8:50 PM      Result Value Range Status   Specimen Description BLOOD RIGHT HAND   Final   Special Requests BOTTLES DRAWN AEROBIC ONLY 8CC   Final   Culture  Setup Time     Final   Value: 09/20/2013 01:05     Performed at [facility]   Culture     Final   Value: NO GROWTH 5 DAYS     Performed at [facility]   Report Status 09/26/2013 FINAL   Final  AFB CULTURE WITH SMEAR     Status: None   Collection Time    09/21/13  1:38 PM      Result Value Range Status   Specimen Description WOUND LEFT FOOT   Final   Special Requests NONE   Final   ACID FAST SMEAR     Final   Value: NO ACID FAST BACILLI SEEN     Performed at [facility]   Culture     Final   Value: CULTURE WILL BE EXAMINED FOR 6 WEEKS BEFORE ISSUING A FINAL REPORT     Performed at [facility]  Report Status  PENDING   Incomplete  WOUND CULTURE     Status: None   Collection Time    09/21/13  1:38 PM      Result Value Range Status   Specimen Description WOUND LEFT FOOT   Final   Special Requests NONE   Final   Gram Stain     Final   Value: FEW WBC PRESENT,BOTH PMN AND MONONUCLEAR     NO SQUAMOUS EPITHELIAL CELLS SEEN     NO ORGANISMS SEEN     [facility]   Culture     Final   Value: MULTIPLE ORGANISMS PRESENT, NONE PREDOMINANT     Note: NO STAPHYLOCOCCUS AUREUS ISOLATED NO GROUP A STREP (S.PYOGENES) ISOLATED     [facility] at [facility]   Report Status 09/24/2013 FINAL   Final     Studies: No results found.  Scheduled Meds: . clindamycin  300 mg Oral Q8H  . diltiazem  240 mg Oral Daily  . folic acid  1 mg Oral Daily  . heparin  5,000 Units Subcutaneous Q8H  . hydrALAZINE  25 mg Oral Q8H  . multivitamin with minerals  1 tablet Oral Daily  . mupirocin cream   Topical BID  . nicotine  14 mg Transdermal Daily  . thiamine  100 mg Oral Daily  . triamterene-hydrochlorothiazide  1 tablet Oral Daily   Continuous Infusions:   Principal Problem:   Cellulitis of left foot Active Problems:   Abscess of left foot   Accelerated hypertension   Hypokalemia    Time spent: 30 minutes   [facility] HospitalHOMPSON,Olman Yono MD [facility] Pager [phone]. If 7PM-7AM, please contact night-coverage at [email or web address], password [email or web address] 09/26/2013, 11:15 AM  LOS: 8 days

## 2013-09-26 NOTE — Progress Notes (Signed)
Discharge instructions given. Pt verbalized understanding and all questions were answered. Pt d/c with home health.

## 2013-09-26 NOTE — Progress Notes (Signed)
CSW met with the Pt for an update on d/c plan of care. CSW explained that we could not find a facility in the local area and we are seeking placement options in [place], [place]. Pt stated "Oh no, I am not going out of town." CSW asked Pt if he will have assistance at home and the Pt stated that his "sister lives with him and would be able to take care of him". CSW will notified CM that Pt would like to go home with family.    CSW notified facility of Pt's choice.   CSW signing off.    [facility]  [address];  [phone] Phone: [phone]

## 2013-09-26 NOTE — Progress Notes (Signed)
CSW contacted  [facility]  Selena Batten(Kim  [phone]) concerning d/c planning and possible bed offer with LOG.   Selena BattenKim stated requested CSW send information to facility and she will look over it and contact CSW back concerning determination.   CSW also faxed Pt information to [facility] [phone] for review and possible bed offer.   CSW contacted [facility] [phone] possible placement and is awaiting a call back for determination .    CSW will continue to follow Pt for d/c planning.    Leron Croakassandra Aldine Grainger [facility] CenterCSWA  [facility]  [address];  [phone] Phone: [phone]

## 2013-10-23 LAB — FUNGUS CULTURE W SMEAR: Fungal Smear: NONE SEEN

## 2013-11-03 LAB — AFB CULTURE WITH SMEAR (NOT AT ARMC): ACID FAST SMEAR: NONE SEEN

## 2013-11-06 ENCOUNTER — Inpatient Hospital Stay (HOSPITAL_COMMUNITY)
Admission: EM | Admit: 2013-11-06 | Discharge: 2013-11-10 | DRG: 540 | Discharge status: Against Medical Advice | Payer: Medicaid Other | Attending: Internal Medicine | Admitting: Internal Medicine

## 2013-11-06 ENCOUNTER — Emergency Department (HOSPITAL_COMMUNITY): Payer: Medicaid Other

## 2013-11-06 ENCOUNTER — Encounter (HOSPITAL_COMMUNITY): Payer: Self-pay | Admitting: Emergency Medicine

## 2013-11-06 DIAGNOSIS — I10 Essential (primary) hypertension: Secondary | ICD-10-CM | POA: Diagnosis present

## 2013-11-06 DIAGNOSIS — F1010 Alcohol abuse, uncomplicated: Secondary | ICD-10-CM | POA: Diagnosis present

## 2013-11-06 DIAGNOSIS — L02419 Cutaneous abscess of limb, unspecified: Secondary | ICD-10-CM | POA: Diagnosis present

## 2013-11-06 DIAGNOSIS — J449 Chronic obstructive pulmonary disease, unspecified: Secondary | ICD-10-CM | POA: Diagnosis present

## 2013-11-06 DIAGNOSIS — F17200 Nicotine dependence, unspecified, uncomplicated: Secondary | ICD-10-CM | POA: Diagnosis present

## 2013-11-06 DIAGNOSIS — L0390 Cellulitis, unspecified: Secondary | ICD-10-CM

## 2013-11-06 DIAGNOSIS — I96 Gangrene, not elsewhere classified: Secondary | ICD-10-CM | POA: Diagnosis present

## 2013-11-06 DIAGNOSIS — L02619 Cutaneous abscess of unspecified foot: Secondary | ICD-10-CM

## 2013-11-06 DIAGNOSIS — M86271 Subacute osteomyelitis, right ankle and foot: Secondary | ICD-10-CM | POA: Diagnosis present

## 2013-11-06 DIAGNOSIS — M869 Osteomyelitis, unspecified: Principal | ICD-10-CM | POA: Diagnosis present

## 2013-11-06 DIAGNOSIS — L02612 Cutaneous abscess of left foot: Secondary | ICD-10-CM

## 2013-11-06 DIAGNOSIS — Z8249 Family history of ischemic heart disease and other diseases of the circulatory system: Secondary | ICD-10-CM

## 2013-11-06 DIAGNOSIS — L03116 Cellulitis of left lower limb: Secondary | ICD-10-CM

## 2013-11-06 DIAGNOSIS — Z833 Family history of diabetes mellitus: Secondary | ICD-10-CM

## 2013-11-06 DIAGNOSIS — J4489 Other specified chronic obstructive pulmonary disease: Secondary | ICD-10-CM | POA: Diagnosis present

## 2013-11-06 DIAGNOSIS — L03119 Cellulitis of unspecified part of limb: Secondary | ICD-10-CM

## 2013-11-06 LAB — CBC WITH DIFFERENTIAL/PLATELET
Basophils Absolute: 0 10*3/uL (ref 0.0–0.1)
Basophils Relative: 0 % (ref 0–1)
EOS PCT: 2 % (ref 0–5)
Eosinophils Absolute: 0.2 10*3/uL (ref 0.0–0.7)
HCT: 37.8 % — ABNORMAL LOW (ref 39.0–52.0)
HEMOGLOBIN: 13 g/dL (ref 13.0–17.0)
LYMPHS ABS: 3 10*3/uL (ref 0.7–4.0)
LYMPHS PCT: 28 % (ref 12–46)
MCH: 31.5 pg (ref 26.0–34.0)
MCHC: 34.4 g/dL (ref 30.0–36.0)
MCV: 91.5 fL (ref 78.0–100.0)
MONO ABS: 0.5 10*3/uL (ref 0.1–1.0)
MONOS PCT: 4 % (ref 3–12)
NEUTROS ABS: 7 10*3/uL (ref 1.7–7.7)
Neutrophils Relative %: 66 % (ref 43–77)
Platelets: 379 10*3/uL (ref 150–400)
RBC: 4.13 MIL/uL — ABNORMAL LOW (ref 4.22–5.81)
RDW: 14.3 % (ref 11.5–15.5)
WBC: 10.7 10*3/uL — AB (ref 4.0–10.5)

## 2013-11-06 LAB — COMPREHENSIVE METABOLIC PANEL
ALT: 11 U/L (ref 0–53)
AST: 15 U/L (ref 0–37)
Albumin: 4.4 g/dL (ref 3.5–5.2)
Alkaline Phosphatase: 79 U/L (ref 39–117)
BUN: 12 mg/dL (ref 6–23)
CALCIUM: 9.7 mg/dL (ref 8.4–10.5)
CHLORIDE: 98 meq/L (ref 96–112)
CO2: 26 meq/L (ref 19–32)
CREATININE: 0.72 mg/dL (ref 0.50–1.35)
GLUCOSE: 113 mg/dL — AB (ref 70–99)
Potassium: 3.6 mEq/L — ABNORMAL LOW (ref 3.7–5.3)
Sodium: 140 mEq/L (ref 137–147)
Total Bilirubin: 0.2 mg/dL — ABNORMAL LOW (ref 0.3–1.2)
Total Protein: 8.8 g/dL — ABNORMAL HIGH (ref 6.0–8.3)

## 2013-11-06 LAB — URIC ACID: Uric Acid, Serum: 8.8 mg/dL — ABNORMAL HIGH (ref 4.0–7.8)

## 2013-11-06 MED ORDER — HEPARIN SODIUM (PORCINE) 5000 UNIT/ML IJ SOLN
5000.0000 [IU] | Freq: Three times a day (TID) | INTRAMUSCULAR | Status: DC
  Administered 2013-11-06 – 2013-11-07 (×2): 5000 [IU] via SUBCUTANEOUS
  Filled 2013-11-06 (×5): qty 1

## 2013-11-06 MED ORDER — PIPERACILLIN-TAZOBACTAM 3.375 G IVPB 30 MIN
3.3750 g | Freq: Once | INTRAVENOUS | Status: AC
  Administered 2013-11-06: 3.375 g via INTRAVENOUS
  Filled 2013-11-06: qty 50

## 2013-11-06 MED ORDER — VANCOMYCIN HCL IN DEXTROSE 750-5 MG/150ML-% IV SOLN
750.0000 mg | Freq: Three times a day (TID) | INTRAVENOUS | Status: DC
  Administered 2013-11-07 (×2): 750 mg via INTRAVENOUS
  Filled 2013-11-06 (×4): qty 150

## 2013-11-06 MED ORDER — ONDANSETRON HCL 4 MG PO TABS: 4.0000 mg | ORAL_TABLET | Freq: Four times a day (QID) | ORAL | Status: DC | PRN

## 2013-11-06 MED ORDER — LORAZEPAM 1 MG PO TABS
0.0000 mg | ORAL_TABLET | Freq: Two times a day (BID) | ORAL | Status: AC
  Administered 2013-11-09: 2 mg via ORAL
  Administered 2013-11-09 – 2013-11-10 (×3): 1 mg via ORAL
  Filled 2013-11-06: qty 2
  Filled 2013-11-06 (×3): qty 1

## 2013-11-06 MED ORDER — FOLIC ACID 1 MG PO TABS
1.0000 mg | ORAL_TABLET | Freq: Every day | ORAL | Status: DC
  Administered 2013-11-06 – 2013-11-10 (×5): 1 mg via ORAL
  Filled 2013-11-06 (×5): qty 1

## 2013-11-06 MED ORDER — THIAMINE HCL 100 MG/ML IJ SOLN
100.0000 mg | Freq: Every day | INTRAMUSCULAR | Status: DC
  Administered 2013-11-06: 100 mg via INTRAVENOUS
  Filled 2013-11-06 (×5): qty 1

## 2013-11-06 MED ORDER — LORAZEPAM 1 MG PO TABS: 1.0000 mg | ORAL_TABLET | Freq: Four times a day (QID) | ORAL | Status: AC | PRN

## 2013-11-06 MED ORDER — ONDANSETRON HCL 4 MG/2ML IJ SOLN
4.0000 mg | Freq: Four times a day (QID) | INTRAMUSCULAR | Status: DC | PRN
Start: 2013-11-06 — End: 2013-11-10

## 2013-11-06 MED ORDER — SODIUM CHLORIDE 0.9 % IV SOLN
INTRAVENOUS | Status: DC
  Administered 2013-11-06: 16:00:00 via INTRAVENOUS

## 2013-11-06 MED ORDER — DILTIAZEM HCL ER BEADS 240 MG PO CP24
240.0000 mg | ORAL_CAPSULE | Freq: Every day | ORAL | Status: DC
  Administered 2013-11-06 – 2013-11-10 (×5): 240 mg via ORAL
  Filled 2013-11-06 (×5): qty 1

## 2013-11-06 MED ORDER — VANCOMYCIN HCL IN DEXTROSE 1-5 GM/200ML-% IV SOLN
1000.0000 mg | Freq: Once | INTRAVENOUS | Status: AC
  Administered 2013-11-06: 1000 mg via INTRAVENOUS
  Filled 2013-11-06: qty 200

## 2013-11-06 MED ORDER — ALBUTEROL SULFATE (2.5 MG/3ML) 0.083% IN NEBU: 2.5000 mg | INHALATION_SOLUTION | RESPIRATORY_TRACT | Status: DC | PRN

## 2013-11-06 MED ORDER — OXYCODONE HCL 5 MG PO TABS
5.0000 mg | ORAL_TABLET | ORAL | Status: DC | PRN
  Administered 2013-11-06 – 2013-11-09 (×7): 10 mg via ORAL
  Filled 2013-11-06 (×7): qty 2

## 2013-11-06 MED ORDER — MORPHINE SULFATE 4 MG/ML IJ SOLN
4.0000 mg | Freq: Once | INTRAMUSCULAR | Status: AC
  Administered 2013-11-06: 4 mg via INTRAVENOUS
  Filled 2013-11-06: qty 1

## 2013-11-06 MED ORDER — PIPERACILLIN-TAZOBACTAM 3.375 G IVPB
3.3750 g | Freq: Three times a day (TID) | INTRAVENOUS | Status: DC
  Administered 2013-11-07 – 2013-11-10 (×11): 3.375 g via INTRAVENOUS
  Filled 2013-11-06 (×15): qty 50

## 2013-11-06 MED ORDER — ADULT MULTIVITAMIN W/MINERALS CH
1.0000 | ORAL_TABLET | Freq: Every day | ORAL | Status: DC
  Administered 2013-11-06 – 2013-11-10 (×5): 1 via ORAL
  Filled 2013-11-06 (×5): qty 1

## 2013-11-06 MED ORDER — LORAZEPAM 2 MG/ML IJ SOLN: 1.0000 mg | Freq: Four times a day (QID) | INTRAMUSCULAR | Status: AC | PRN

## 2013-11-06 MED ORDER — TRIAMTERENE-HCTZ 37.5-25 MG PO TABS
1.0000 | ORAL_TABLET | Freq: Every day | ORAL | Status: DC
  Administered 2013-11-06 – 2013-11-10 (×5): 1 via ORAL
  Filled 2013-11-06 (×5): qty 1

## 2013-11-06 MED ORDER — SENNA 8.6 MG PO TABS
1.0000 | ORAL_TABLET | Freq: Two times a day (BID) | ORAL | Status: DC
  Administered 2013-11-06 – 2013-11-10 (×8): 8.6 mg via ORAL
  Filled 2013-11-06 (×9): qty 1

## 2013-11-06 MED ORDER — SODIUM CHLORIDE 0.9 % IJ SOLN
3.0000 mL | Freq: Two times a day (BID) | INTRAMUSCULAR | Status: DC
  Administered 2013-11-07 – 2013-11-10 (×3): 3 mL via INTRAVENOUS

## 2013-11-06 MED ORDER — POTASSIUM CHLORIDE CRYS ER 20 MEQ PO TBCR
40.0000 meq | EXTENDED_RELEASE_TABLET | Freq: Once | ORAL | Status: AC
Start: 2013-11-06 — End: 2013-11-06
  Administered 2013-11-06: 40 meq via ORAL
  Filled 2013-11-06: qty 2

## 2013-11-06 MED ORDER — VITAMIN B-1 100 MG PO TABS
100.0000 mg | ORAL_TABLET | Freq: Every day | ORAL | Status: DC
  Administered 2013-11-07 – 2013-11-10 (×4): 100 mg via ORAL
  Filled 2013-11-06 (×5): qty 1

## 2013-11-06 MED ORDER — ACETAMINOPHEN 650 MG RE SUPP: 650.0000 mg | Freq: Four times a day (QID) | RECTAL | Status: DC | PRN

## 2013-11-06 MED ORDER — ACETAMINOPHEN 325 MG PO TABS
650.0000 mg | ORAL_TABLET | Freq: Four times a day (QID) | ORAL | Status: DC | PRN
  Administered 2013-11-07: 650 mg via ORAL
  Filled 2013-11-06: qty 2

## 2013-11-06 MED ORDER — LORAZEPAM 1 MG PO TABS
0.0000 mg | ORAL_TABLET | Freq: Four times a day (QID) | ORAL | Status: AC
  Administered 2013-11-06 – 2013-11-08 (×4): 1 mg via ORAL
  Filled 2013-11-06 (×2): qty 1
  Filled 2013-11-06: qty 2
  Filled 2013-11-06 (×2): qty 1

## 2013-11-06 MED ORDER — ONDANSETRON HCL 4 MG/2ML IJ SOLN
4.0000 mg | Freq: Once | INTRAMUSCULAR | Status: AC
  Administered 2013-11-06: 4 mg via INTRAVENOUS
  Filled 2013-11-06: qty 2

## 2013-11-06 MED ORDER — SODIUM CHLORIDE 0.9 % IV SOLN
INTRAVENOUS | Status: DC
  Administered 2013-11-06 – 2013-11-07 (×3): via INTRAVENOUS
  Administered 2013-11-09: 75 mL/h via INTRAVENOUS
  Administered 2013-11-10: 11:00:00 via INTRAVENOUS

## 2013-11-06 MED ORDER — MORPHINE SULFATE 2 MG/ML IJ SOLN
1.0000 mg | INTRAMUSCULAR | Status: DC | PRN
  Administered 2013-11-07: 2 mg via INTRAVENOUS
  Filled 2013-11-06 (×3): qty 1

## 2013-11-06 NOTE — ED Notes (Signed)
Patient states that he had 2 40-oz beers today.

## 2013-11-06 NOTE — ED Provider Notes (Signed)
CSN: [id number]     Arrival date & time 11/06/13  1442 History   First MD Initiated Contact with Patient 11/06/13 1504     Chief Complaint  Patient presents with  . Foot Pain     (Consider location/radiation/quality/duration/timing/severity/associated sxs/prior Treatment) The history is provided by the patient. No language interpreter was used.  [place] Biebel is a 73 are old male with past medical history of gout, hypertension alcohol abuse presenting to emergency department with left foot pain. Patient reported there is constant pain to the left foot with mild radiation up to the mid-tib-fib region, anterior aspect described as "10 toothaches"-reported some drainage when he removed his sock. Reported that he is unable to apply any pressure to the left foot secondary to pain. Stated that he's been taking Tylenol and Advil with minimal relief. This provider reviewed patient's chart. Patient was seen and assessed in the ED on 09/18/2013 was diagnosed with cellulitis to the left foot and required IV antibiotics. Patient was admitted for IV antibiotic administration in the hospital. MRI was performed on 09/20/2013 where an abscess localized to the plantar aspect of the left foot was identified with I&D performed by orthopedic surgery on 09/21/2013 - no evidence of osteomyelitis was noted on the MRI at that time. The patient was discharged on 09/25/2013 was placed on clindamycin by mouth-patient was due to followup with Foot physician as outpatient-patient has not followed up as outpatient. Denied fever, nausea, vomiting, diarrhea, abdominal pain, chest pain, shortness of breath, difficulty breathing, numbness, tingling, recent injuries. PCP none  Past Medical History  Diagnosis Date  . Gout   . Hypertension   . ETOH abuse    Past Surgical History  Procedure Laterality Date  . Leg surgery     History reviewed. No pertinent family history. History  Substance Use Topics  . Smoking status:  Current Every Day Smoker -- 0.50 packs/day for 40 years    Types: Cigarettes  . Smokeless tobacco: Never Used  . Alcohol Use: 72.0 oz/week    120 Cans of beer per week     Comment: "A whole Bunch - I am a drunk"    Review of Systems  Constitutional: Negative for fever and chills.  Respiratory: Negative for chest tightness and shortness of breath.   Cardiovascular: Negative for chest pain.  Gastrointestinal: Negative for nausea, vomiting and abdominal pain.  Musculoskeletal: Positive for arthralgias (Left foot). Negative for back pain and neck pain.  Skin: Positive for wound (Left foot).  Neurological: Negative for dizziness and weakness.  All other systems reviewed and are negative.      Allergies  Review of patient's allergies indicates no known allergies.  Home Medications   Current Outpatient Rx  Name  Route  Sig  Dispense  Refill  . diltiazem (TIAZAC) 240 MG 24 hr capsule   Oral   Take 1 capsule (240 mg total) by mouth daily.   31 capsule   0   . ibuprofen (ADVIL,MOTRIN) 200 MG tablet   Oral   Take 200 mg by mouth every 6 (six) hours as needed for fever or moderate pain.         . Ibuprofen-Diphenhydramine Cit (ADVIL PM PO)   Oral   Take 1 tablet by mouth at bedtime as needed.         Marland Kitchen oxyCODONE (OXY IR/ROXICODONE) 5 MG immediate release tablet   Oral   Take 1-2 tablets (5-10 mg total) by mouth every 3 (three) hours as needed for  severe pain.   20 tablet   0   . triamterene-hydrochlorothiazide (MAXZIDE-25) 37.5-25 MG per tablet   Oral   Take 1 tablet by mouth daily.   31 tablet   0    BP 147/88  Pulse 93  Temp(Src) 97.9 F (36.6 C) (Oral)  Resp 17  SpO2 89% Physical Exam  Nursing note and vitals reviewed. Constitutional: He is oriented to person, place, and time. He appears well-developed and well-nourished. No distress.  HENT:  Head: Normocephalic and atraumatic.  Mouth/Throat: Oropharynx is clear and moist. No oropharyngeal exudate.   Eyes: Conjunctivae and EOM are normal. Pupils are equal, round, and reactive to light. Right eye exhibits no discharge. Left eye exhibits no discharge.  Neck: Normal range of motion. Neck supple.  Cardiovascular: Normal rate, regular rhythm and normal heart sounds.   Pulses:      Radial pulses are 2+ on the right side, and 2+ on the left side.       Dorsalis pedis pulses are 2+ on the right side, and 2+ on the left side.  Pulmonary/Chest: Effort normal and breath sounds normal. No respiratory distress. He has no wheezes. He has no rales.  Musculoskeletal: He exhibits tenderness.  Swelling, erythema, warmth upon palpation localized to the left foot with discomfort upon palpation circumferentially. Swollen toes identified. Necrotic tissue identified to the third digit of the left foot. Positive active drainage of thick white greenish discharge without an odor. Decreased range of motion to the digits secondary to pain. Puncture wound noted to the plantar aspect of the foot, just below the base of the third digit of the left foot. Negative swelling or erythema, deformities noted to the left ankle or tib-fib region.  Erythema, mild swelling, warmth upon palpation to the dorsal aspect of the right foot. Full range of motion to the digits noted. Strength intact. Cap refill less than 3 seconds. Full range of motion to the right ankle.  Neurological: He is alert and oriented to person, place, and time. He exhibits normal muscle tone. Coordination normal.  Skin: He is not diaphoretic. There is erythema.  Please see musculoskeletal  Psychiatric: He has a normal mood and affect. His behavior is normal. Thought content normal.    ED Course  Procedures (including critical care time)  5:28 PM This provider spoke with Dr. Renato Gails, orthopedic surgeon, patient to be seen and assessed by physician. Orthopedics to see patient.   5:41 PM This provider spoke with patient regarding labs and imaging. Patient to be  seen and assessed by orthopedics - patient understood.   6:56 PM Patient seen and assessed by orthopedics. Patient to be admitted to Internal Medicine floor. Recommended MRI to be performed of the left foot. Recommended IV antibiotics to be started.   7: 20 PM This provider spoke with Dr. Griffin Basil from [facility] - discussed case, history, presentation, labs, imaging in great detail. Due to patient's history of alcohol abuse - recommended patient to be admitted to Telemetry floor secondary to possible withdrawls and patient to be monitored.   Results for orders placed during the hospital encounter of 11/06/13  CBC WITH DIFFERENTIAL      Result Value Ref Range   WBC 10.7 (*) 4.0 - 10.5 K/uL   RBC 4.13 (*) 4.22 - 5.81 MIL/uL   Hemoglobin 13.0  13.0 - 17.0 g/dL   HCT 16.1 (*) 09.6 - 04.5 %   MCV 91.5  78.0 - 100.0 fL   MCH 31.5  26.0 -  34.0 pg   MCHC 34.4  30.0 - 36.0 g/dL   RDW 16.1  09.6 - 04.5 %   Platelets 379  150 - 400 K/uL   Neutrophils Relative % 66  43 - 77 %   Neutro Abs 7.0  1.7 - 7.7 K/uL   Lymphocytes Relative 28  12 - 46 %   Lymphs Abs 3.0  0.7 - 4.0 K/uL   Monocytes Relative 4  3 - 12 %   Monocytes Absolute 0.5  0.1 - 1.0 K/uL   Eosinophils Relative 2  0 - 5 %   Eosinophils Absolute 0.2  0.0 - 0.7 K/uL   Basophils Relative 0  0 - 1 %   Basophils Absolute 0.0  0.0 - 0.1 K/uL  COMPREHENSIVE METABOLIC PANEL      Result Value Ref Range   Sodium 140  137 - 147 mEq/L   Potassium 3.6 (*) 3.7 - 5.3 mEq/L   Chloride 98  96 - 112 mEq/L   CO2 26  19 - 32 mEq/L   Glucose, Bld 113 (*) 70 - 99 mg/dL   BUN 12  6 - 23 mg/dL   Creatinine, Ser 4.09  0.50 - 1.35 mg/dL   Calcium 9.7  8.4 - 81.1 mg/dL   Total Protein 8.8 (*) 6.0 - 8.3 g/dL   Albumin 4.4  3.5 - 5.2 g/dL   AST 15  0 - 37 U/L   ALT 11  0 - 53 U/L   Alkaline Phosphatase 79  39 - 117 U/L   Total Bilirubin 0.2 (*) 0.3 - 1.2 mg/dL   GFR calc non Af Amer >90  >90 mL/min   GFR calc Af Amer >90  >90 mL/min   Dg Foot  Complete Left  11/06/2013   CLINICAL DATA:  Bilateral foot pain and swelling without known injury chronic wound between in the third and fourth toe  EXAM: LEFT FOOT - COMPLETE 3+ VIEW  COMPARISON:  None  FINDINGS: The bones of the foot are osteopenic. There are mild degenerative interphalangeal joint changes present. There is loss of sharp cortical margin of the lateral aspect of the head of the third metatarsal worrisome for osteomyelitis. The adjacent fourth digit is grossly normal. I cannot exclude minimal soft tissue gas lateral to the third metatarsophalangeal joint. More proximally the bones of the foot exhibit no lytic or blastic lesion. There is mild soft tissue swelling over the forefoot.  IMPRESSION: The findings are worrisome for osteomyelitis involving the lateral aspect of the head of the third metatarsal. There is overlying soft tissue swelling consistent with cellulitis.   Electronically Signed   By: David  [place]   On: 11/06/2013 16:04   Dg Foot Complete Right  11/06/2013   CLINICAL DATA:  Bilateral foot pain and swelling.  EXAM: RIGHT FOOT COMPLETE - 3+ VIEW  COMPARISON:  DG ANKLE COMPLETE*R* dated 04/15/2008; DG FOOT COMPLETE*R* dated 12/24/2003  FINDINGS: The bones of the right foot are osteopenic. There is a bony fragment along the ventral aspect of the distal half of the shaft of the first metatarsal. This is separate from the sesamoid bones. No donor site is demonstrated. There are mild degenerative changes of the interphalangeal joint. Deformity of the DIP joint of the third toe is demonstrated. No erosive changes of the right foot similar to those noted on the left are demonstrated. The bones of the hindfoot exhibit no acute abnormality. Mild soft tissue swelling diffusely is present.  IMPRESSION: 1. There are no  erosive changes suspicious for osteomyelitis. There is no soft tissue gas. 2. There is chronic deformity of the DIP joint of the third toe. There is mild degenerative change of the  first metatarsophalangeal joint. 3. There is nonspecific bony density within the ventral aspect of the soft tissues adjacent to the shaft of the first metatarsal. It was present on a study from 2005.   Electronically Signed   By: David  [place]   On: 11/06/2013 16:10   Labs Review Labs Reviewed  CBC WITH DIFFERENTIAL - Abnormal; Notable for the following:    WBC 10.7 (*)    RBC 4.13 (*)    HCT 37.8 (*)    All other components within normal limits  COMPREHENSIVE METABOLIC PANEL - Abnormal; Notable for the following:    Potassium 3.6 (*)    Glucose, Bld 113 (*)    Total Protein 8.8 (*)    Total Bilirubin 0.2 (*)    All other components within normal limits  WOUND CULTURE   Imaging Review Dg Foot Complete Left  11/06/2013   CLINICAL DATA:  Bilateral foot pain and swelling without known injury chronic wound between in the third and fourth toe  EXAM: LEFT FOOT - COMPLETE 3+ VIEW  COMPARISON:  None  FINDINGS: The bones of the foot are osteopenic. There are mild degenerative interphalangeal joint changes present. There is loss of sharp cortical margin of the lateral aspect of the head of the third metatarsal worrisome for osteomyelitis. The adjacent fourth digit is grossly normal. I cannot exclude minimal soft tissue gas lateral to the third metatarsophalangeal joint. More proximally the bones of the foot exhibit no lytic or blastic lesion. There is mild soft tissue swelling over the forefoot.  IMPRESSION: The findings are worrisome for osteomyelitis involving the lateral aspect of the head of the third metatarsal. There is overlying soft tissue swelling consistent with cellulitis.   Electronically Signed   By: David  [place]   On: 11/06/2013 16:04   Dg Foot Complete Right  11/06/2013   CLINICAL DATA:  Bilateral foot pain and swelling.  EXAM: RIGHT FOOT COMPLETE - 3+ VIEW  COMPARISON:  DG ANKLE COMPLETE*R* dated 04/15/2008; DG FOOT COMPLETE*R* dated 12/24/2003  FINDINGS: The bones of the right foot are  osteopenic. There is a bony fragment along the ventral aspect of the distal half of the shaft of the first metatarsal. This is separate from the sesamoid bones. No donor site is demonstrated. There are mild degenerative changes of the interphalangeal joint. Deformity of the DIP joint of the third toe is demonstrated. No erosive changes of the right foot similar to those noted on the left are demonstrated. The bones of the hindfoot exhibit no acute abnormality. Mild soft tissue swelling diffusely is present.  IMPRESSION: 1. There are no erosive changes suspicious for osteomyelitis. There is no soft tissue gas. 2. There is chronic deformity of the DIP joint of the third toe. There is mild degenerative change of the first metatarsophalangeal joint. 3. There is nonspecific bony density within the ventral aspect of the soft tissues adjacent to the shaft of the first metatarsal. It was present on a study from 2005.   Electronically Signed   By: David  [place]   On: 11/06/2013 16:10    EKG Interpretation   None       MDM   Final diagnoses:  Osteomyelitis  Cellulitis  Alcohol abuse   Medications  0.9 %  sodium chloride infusion ( Intravenous New Bag/Given 11/06/13 1624)  vancomycin (VANCOCIN) IVPB 1000 mg/200 mL premix (not administered)  piperacillin-tazobactam (ZOSYN) IVPB 3.375 g (3.375 g Intravenous New Bag/Given 11/06/13 1904)  morphine 4 MG/ML injection 4 mg (4 mg Intravenous Given 11/06/13 1629)  ondansetron (ZOFRAN) injection 4 mg (4 mg Intravenous Given 11/06/13 1626)   Filed Vitals:   11/06/13 1458 11/06/13 1711 11/06/13 1800 11/06/13 1900  BP: 135/82 118/76 126/69 147/88  Pulse: 102 92 93   Temp: 97.9 F (36.6 C)     TempSrc: Oral     Resp: 20 18  17   SpO2: 95% 94% 89%     Patient presenting to the ED with left foot pain that progressively got worse as patient was discharged in January 2015. Patient reports that the left foot pain is constant described as "10 tooth aches" with active  drainage of a yellow greenish discharge. Reported that he is unable to apply any pressure to the foot secondary to excruciating pain. Has not followed up with outpatient followup for foot. This provider reviewed patient's chart. Patient was seen and assessed in the ED and was diagnosed with cellulitis in the left foot was admitted to hospital for IV antibiotics on 09/18/2013. MRI of the left foot was performed with finding of abscess that was drained on 09/21/2013 by orthopedics surgery. Patient was discharged on 09/25/2013-IV antibiotics of Zosyn and vancomycin were administered, patient was discharged with clindamycin by mouth. Patient was to to followup with foot doctor as outpatient-patient has not followed up. Alert and oriented. GCS 15. Heart rate and rhythm normal. Lungs clear to auscultation. Radial and DP pulses 2+ bilaterally. Swelling, erythema, inflammation or warmth upon palpation noted to the left foot-discomfort upon palpation circumferentially. Active drainage noted to the third and fourth digits of the left foot. Necrotic tissue noted to the dorsal aspect of the third toe of the left foot. Decreased range of motion to the digit of the left foot secondary to pain. Discomfort upon palpation to left ankle circumferentially. Mild erythema, swelling and warmth upon palpation noted to the dorsal aspect of the right foot with mild discomfort upon palpation. CBC noted mild elevated white blood cell count 10.7--negative leukocytosis or left shift noted. CMP negative findings-mildly low potassium of 3.6. Plain film of right foot noted no erosive changes suspicious for osteomyelitis-negative findings. Plain film of left foot concerning for osteomyelitis involving the lateral aspect of the head of the third metatarsal with overlying soft tissue swelling consistent with cellulitis. When compared to MRI of the left foot was performed on 09/20/2013 there is no findings of possible osteomyelitis. This provider  spoke with patient orthopedics surgery regarding case and new finding of osteomyelitis on plain film of left foot that was not seen on MRI of the left foot that was performed on 09/20/2013. Dr. August Saucerean to see patient. This provider spoke with Dr. Emmit PomfretViyuh - patient to be admitted to the hospital for osteomyelitis, cellulitis for IV antibiotics and most likely debridement. Patient admitted to Telemetry floor since patient has strong history of alcohol abuse and high risk of withdrawls. Patient stable for transfer.   Raymon MuttonMarissa Teasia Zapf, PA-C 11/07/13 1503

## 2013-11-06 NOTE — Consult Note (Signed)
Reason for Consult left foot pain: Referring Physician: Dr. Honor Junes [name] is an 58 y.o. male.  HPI: [name] is a 58 year old patient with left foot pain. He had an abscess drained by Dr. Erlinda [name] approximately a month ago. He has been lost to followup. The left foot was drained on the plantar aspect of an 3rd metatarsal.: MRI and records reviewed showed abscess and no evidence of osteomyelitis at that time. Patient was initially placed on vanc and Zosyn during the hospitalization and discharged on clindamycin. Again he did not followup with Dr. Sherrian [name]. Tonight he presents with increasing pain in the same left foot. He has not been on any antibiotics. He denies any fevers and chills. He has been walking with a cane. At this time the patient patient does not work. He states he is on disability for "my legs and stuff" Past Medical History  Diagnosis Date  . Gout   . Hypertension   . ETOH abuse     Past Surgical History  Procedure Laterality Date  . Leg surgery      History reviewed. No pertinent family history.  Social History:  reports that he has been smoking Cigarettes.  He has a 20 pack-year smoking history. He has never used smokeless tobacco. He reports that he drinks about 72.0 ounces of alcohol per week. He reports that he uses illicit drugs (Cocaine).  Allergies: No Known Allergies  Medications: I have reviewed the patient's current medications.  Results for orders placed during the hospital encounter of 11/06/13 (from the past 48 hour(s))  CBC WITH DIFFERENTIAL     Status: Abnormal   Collection Time    11/06/13  4:12 PM      Result Value Ref Range   WBC 10.7 (*) 4.0 - 10.5 K/uL   RBC 4.13 (*) 4.22 - 5.81 MIL/uL   Hemoglobin 13.0  13.0 - 17.0 g/dL   HCT 37.8 (*) 39.0 - 52.0 %   MCV 91.5  78.0 - 100.0 fL   MCH 31.5  26.0 - 34.0 pg   MCHC 34.4  30.0 - 36.0 g/dL   RDW 14.3  11.5 - 15.5 %   Platelets 379  150 - 400 K/uL   Neutrophils Relative % 66  43 - 77 %   Neutro  Abs 7.0  1.7 - 7.7 K/uL   Lymphocytes Relative 28  12 - 46 %   Lymphs Abs 3.0  0.7 - 4.0 K/uL   Monocytes Relative 4  3 - 12 %   Monocytes Absolute 0.5  0.1 - 1.0 K/uL   Eosinophils Relative 2  0 - 5 %   Eosinophils Absolute 0.2  0.0 - 0.7 K/uL   Basophils Relative 0  0 - 1 %   Basophils Absolute 0.0  0.0 - 0.1 K/uL  COMPREHENSIVE METABOLIC PANEL     Status: Abnormal   Collection Time    11/06/13  4:12 PM      Result Value Ref Range   Sodium 140  137 - 147 mEq/L   Potassium 3.6 (*) 3.7 - 5.3 mEq/L   Chloride 98  96 - 112 mEq/L   CO2 26  19 - 32 mEq/L   Glucose, Bld 113 (*) 70 - 99 mg/dL   BUN 12  6 - 23 mg/dL   Creatinine, Ser 0.72  0.50 - 1.35 mg/dL   Calcium 9.7  8.4 - 10.5 mg/dL   Total Protein 8.8 (*) 6.0 - 8.3 g/dL   Albumin 4.4  3.5 -  5.2 g/dL   AST 15  0 - 37 U/L   ALT 11  0 - 53 U/L   Alkaline Phosphatase 79  39 - 117 U/L   Total Bilirubin 0.2 (*) 0.3 - 1.2 mg/dL   GFR calc non Af Amer >90  >90 mL/min   GFR calc Af Amer >90  >90 mL/min   Comment: (NOTE)     The eGFR has been calculated using the CKD EPI equation.     This calculation has not been validated in all clinical situations.     eGFR's persistently <90 mL/min signify possible Chronic Kidney     Disease.    Dg Foot Complete Left  11/06/2013   CLINICAL DATA:  Bilateral foot pain and swelling without known injury chronic wound between in the third and fourth toe  EXAM: LEFT FOOT - COMPLETE 3+ VIEW  COMPARISON:  None  FINDINGS: The bones of the foot are osteopenic. There are mild degenerative interphalangeal joint changes present. There is loss of sharp cortical margin of the lateral aspect of the head of the third metatarsal worrisome for osteomyelitis. The adjacent fourth digit is grossly normal. I cannot exclude minimal soft tissue gas lateral to the third metatarsophalangeal joint. More proximally the bones of the foot exhibit no lytic or blastic lesion. There is mild soft tissue swelling over the forefoot.   IMPRESSION: The findings are worrisome for osteomyelitis involving the lateral aspect of the head of the third metatarsal. There is overlying soft tissue swelling consistent with cellulitis.   Electronically Signed   By: Ronald  [name]   On: 11/06/2013 16:04   Dg Foot Complete Right  11/06/2013   CLINICAL DATA:  Bilateral foot pain and swelling.  EXAM: RIGHT FOOT COMPLETE - 3+ VIEW  COMPARISON:  DG ANKLE COMPLETE*R* dated 04/15/2008; DG FOOT COMPLETE*R* dated 12/24/2003  FINDINGS: The bones of the right foot are osteopenic. There is a bony fragment along the ventral aspect of the distal half of the shaft of the first metatarsal. This is separate from the sesamoid bones. No donor site is demonstrated. There are mild degenerative changes of the interphalangeal joint. Deformity of the DIP joint of the third toe is demonstrated. No erosive changes of the right foot similar to those noted on the left are demonstrated. The bones of the hindfoot exhibit no acute abnormality. Mild soft tissue swelling diffusely is present.  IMPRESSION: 1. There are no erosive changes suspicious for osteomyelitis. There is no soft tissue gas. 2. There is chronic deformity of the DIP joint of the third toe. There is mild degenerative change of the first metatarsophalangeal joint. 3. There is nonspecific bony density within the ventral aspect of the soft tissues adjacent to the shaft of the first metatarsal. It was present on a study from 2005.   Electronically Signed   By: Ronald  [name]   On: 11/06/2013 16:10    Review of Systems  Constitutional: Negative.   HENT: Negative.   Eyes: Negative.   Respiratory: Negative.   Cardiovascular: Negative.   Gastrointestinal: Negative.   Genitourinary: Negative.   Skin: Negative.   Neurological: Negative.   Endo/Heme/Allergies: Negative.   Psychiatric/Behavioral: Negative.    Blood pressure 126/69, pulse 93, temperature 97.9 F (36.6 C), temperature source Oral, resp. rate 18, SpO2  89.00%. Physical Exam  Constitutional: He appears well-developed.  HENT:  Head: Normocephalic.  Eyes: Pupils are equal, round, and reactive to light.  Neck: Normal range of motion.  Cardiovascular: Normal rate.  Respiratory: Effort normal.  Neurological: He is alert.  Skin: Skin is warm.  Psychiatric: He has a normal mood and affect.   left foot is examined. He has dry gangrene of the third toe. Both third toes are crossover toes on the left and right foot. There is no tissue crepitus. Pedal pulses not palpable mild left they are palpable on the right. Ankle dorsiflexion plantar flexion is intact but somewhat tender. Foot itself is tender to palpation. He does have intact sensation on the plantar and dorsal aspect of the foot. Some swelling is present but began there is no induration or frank purulent discharge from the foot. The plantar incision on the left foot is well-healed. Diabetic-type neuropathic skin changes with dry skin present on the left heel. There is no hair on either leg from the knee down.  Assessment/Plan: Impression is peripheral vascular disease and Oster myelitis affecting the left leg. Patient has obvious Oster myelitis on plain radiographs of the left third toe. This will need surgical intervention. This is a new finding compared when Dr. Sherrian [name] operated on him earlier this year. I think that's the patient may also be in the midst of peripheral vascular disease flare. Pedal pulses are not present but his foot is perfused. As the left-hand side. He needs MRI scan of the left foot to make sure there is no more osteomyelitis present besides the third metatarsal head. Potentially change the operative procedure. Patient also needs ABIs to evaluate the vacsular status of the leg and foot to determine whether or not wound healing as possible distally. Patient does continue to smoke. He's counseled as to the necessity to decrease as much as possible. He also does have a history of alcohol  use and drug use which are medical mitigating factors to be monitored during his hospitalization. It is explained to the patient and he will have to undergo more surgery on the left foot. i will inform  Dr. Sherrian [name] of his admission to [facility] he will see him tomorrow. However recommend subcutaneous heparin for DVT prophylaxis if necessary  to allow for possible surgical planning either tomorrow or Thursday  Ronald [name] SCOTT 11/06/2013, 7:01 PM

## 2013-11-06 NOTE — Progress Notes (Signed)
ANTIBIOTIC CONSULT NOTE - INITIAL  Pharmacy Consult for Vancomycin & Zosyn Indication: Osteomyelitis/Cellulitis  No Known Allergies  Patient Measurements: Height: 5\' 2"  (157.5 cm) Weight: 176 lb 1.6 oz (79.878 kg) IBW/kg (Calculated) : 54.6  Vital Signs: Temp: 98.7 F (37.1 C) (02/24 2011) Temp src: Oral (02/24 2011) BP: 142/84 mmHg (02/24 2011) Pulse Rate: 92 (02/24 2011) Intake/Output from previous day:   Intake/Output from this shift:    Labs:  Recent Labs  11/06/13 1612  WBC 10.7*  HGB 13.0  PLT 379  CREATININE 0.72   Estimated Creatinine Clearance: 94.4 ml/min (by C-G formula based on Cr of 0.72). No results found for this basename: VANCOTROUGH, VANCOPEAK, VANCORANDOM, GENTTROUGH, GENTPEAK, GENTRANDOM, TOBRATROUGH, TOBRAPEAK, TOBRARND, AMIKACINPEAK, AMIKACINTROU, AMIKACIN,  in the last 72 hours   Microbiology: No results found for this or any previous visit (from the past 720 hour(s)).  Medical History: Past Medical History  Diagnosis Date  . Gout   . Hypertension   . ETOH abuse     Medications:  Scheduled:  . diltiazem  240 mg Oral Daily  . folic acid  1 mg Oral Daily  . heparin  5,000 Units Subcutaneous 3 times per day  . LORazepam  0-4 mg Oral Q6H   Followed by  . [START ON 11/08/2013] LORazepam  0-4 mg Oral Q12H  . multivitamin with minerals  1 tablet Oral Daily  . potassium chloride  40 mEq Oral Once  . senna  1 tablet Oral BID  . sodium chloride  3 mL Intravenous Q12H  . thiamine  100 mg Oral Daily   Or  . thiamine  100 mg Intravenous Daily  . triamterene-hydrochlorothiazide  1 tablet Oral Daily  . vancomycin  1,000 mg Intravenous Once   Infusions:  . sodium chloride     Assessment:  [age] yr male with left foot pain.  Abscessed drained within the last month.   Presents with increasing pain in left foot.  Black, necrotic tissue on toe noted.  Films show osteomyelitis on 3rd left toe.  Plan for surgical intervention.  Vancomycin and  Zosyn started in ED.  Upon admission continuation of antibiotics per pharmacy dosing ordered.  CrCl ~ 94 ml/min  Goal of Therapy:  Vancomycin trough level 15-20 mcg/ml  Plan:  Measure antibiotic drug levels at steady state Follow up culture results Zosyn 3.375gm IV q8h (each dose infused over 4 hrs) Vancomycin 750mg  IV q8h  Loletta SpecterPoindexter, Joselyn GlassmanLeann Trefz, PharmD 11/06/2013,8:33 PM

## 2013-11-06 NOTE — H&P (Signed)
[facility] History and Physical  Ronald [name] ZOX:[id number] DOB: [birth year] DOA: 11/06/2013  Referring physician: EDP PCP: No PCP Per Patient   Chief Complaint: Worsening left foot pain with drainage  HPI: Ronald [name] is a 58 y.o. male with history significant for hypertension, alcohol abuse, gout in last discharged from the hospital on 1/13 following admission for cellulitis and abscess of left foot who presents with above complaints. Patient states that he completed the antibiotics prescribed on discharge, but it is noted that he did not follow up with Dr. Roda Shutters as he was directed. He states that over the past 3 weeks pain in the left foot has progressively worsened with increasing drainage as well. He denies fevers. He also reports that he has had some swelling and redness in his right foot. He was seen in the ED and plain films of showed findings worrisome for osteomyelitis involving the lateral aspect of the head of the third metatarsal. Orthopedics was consulted and Dr. August Saucer saw patient and recommended an MRI of the right foot as well as ABI. He is admitted for further evaluation and management. He admits to drinking alcohol 2-3 times a week(about his 24 oz can and a 40 ounce bottle at a time and he drank today.)   Review of Systems The patient denies anorexia, fever, weight loss, vision loss, decreased hearing, hoarseness, chest pain, syncope, dyspnea on exertion, peripheral edema, balance deficits, hemoptysis, abdominal pain, melena, hematochezia, severe indigestion/heartburn, hematuria,, suspicious skin lesions, transient blindness,depression,  Past Medical History  Diagnosis Date  . Gout   . Hypertension   . ETOH abuse    Past Surgical History  Procedure Laterality Date  . Leg surgery     Social History:  reports that he has been smoking Cigarettes.  He has a 20 pack-year smoking history. He has never used smokeless tobacco. He reports that he drinks about 72.0 ounces  of alcohol per week. He reports that he uses illicit drugs (Cocaine).  No Known Allergies  History reviewed. No pertinent family history.   Prior to Admission medications   Medication Sig Start Date End Date Taking? Authorizing Provider  diltiazem (TIAZAC) 240 MG 24 hr capsule Take 1 capsule (240 mg total) by mouth daily. 09/26/13  Yes Rodolph Bong, MD  ibuprofen (ADVIL,MOTRIN) 200 MG tablet Take 200 mg by mouth every 6 (six) hours as needed for fever or moderate pain.   Yes Historical Provider, MD  Ibuprofen-Diphenhydramine Cit (ADVIL PM PO) Take 1 tablet by mouth at bedtime as needed.   Yes Historical Provider, MD  oxyCODONE (OXY IR/ROXICODONE) 5 MG immediate release tablet Take 1-2 tablets (5-10 mg total) by mouth every 3 (three) hours as needed for severe pain. 09/26/13  Yes Rodolph Bong, MD  triamterene-hydrochlorothiazide (MAXZIDE-25) 37.5-25 MG per tablet Take 1 tablet by mouth daily. 09/26/13  Yes Rodolph Bong, MD   Physical Exam: Filed Vitals:   11/06/13 2011  BP: 142/84  Pulse: 92  Temp: 98.7 F (37.1 C)  Resp: 20    BP 142/84  Pulse 92  Temp(Src) 98.7 F (37.1 C) (Oral)  Resp 20  Ht 5\' 2"  (1.575 m)  Wt 79.878 kg (176 lb 1.6 oz)  BMI 32.20 kg/m2  SpO2 100% Constitutional: Vital signs reviewed.  Patient is a well-developed and well-nourished  in no acute distress and cooperative with exam. Alert and oriented x3.  Head: Normocephalic and atraumatic Mouth: no erythema or exudates, MMM Eyes: PERRL, EOMI, conjunctivae normal, No scleral icterus.  Neck: Supple, Trachea midline normal ROM, No JVD, mass, thyromegaly, or carotid bruit present.  Cardiovascular: RRR, S1 normal, S2 normal, no MRG, pulses symmetric and intact bilaterally Pulmonary/Chest: normal respiratory effort, CTAB, no wheezes, rales, or rhonchi Abdominal: Soft. Non-tender, non-distended, bowel sounds are normal, no masses, organomegaly, or guarding present.  GU: no CVA tenderness  Extremities:  Dorsum of Left foot with mild erythema and edema, tender to palpation, the third toe has a necrotic/gangrenous-appearing area. The incision on the plantar surface is healed. Right lower leg with edema greater in the ankle, and it is erythematous, nontender. Both lower legs are atrophic appearing. Extremities with no tremor  Neurological: A&O x3,cranial nerve II-XII are grossly intact, no focal motor deficit,  Skin: Warm, dry and intact. No rash, cyanosis, or clubbing.  Psychiatric: Normal mood and affect. speech and behavior is normal. Judgment and thought content normal. Cognition and memory are normal.                Labs on Admission:  Basic Metabolic Panel:  Recent Labs Lab 11/06/13 1612  NA 140  K 3.6*  CL 98  CO2 26  GLUCOSE 113*  BUN 12  CREATININE 0.72  CALCIUM 9.7   Liver Function Tests:  Recent Labs Lab 11/06/13 1612  AST 15  ALT 11  ALKPHOS 79  BILITOT 0.2*  PROT 8.8*  ALBUMIN 4.4   No results found for this basename: LIPASE, AMYLASE,  in the last 168 hours No results found for this basename: AMMONIA,  in the last 168 hours CBC:  Recent Labs Lab 11/06/13 1612  WBC 10.7*  NEUTROABS 7.0  HGB 13.0  HCT 37.8*  MCV 91.5  PLT 379   Cardiac Enzymes: No results found for this basename: CKTOTAL, CKMB, CKMBINDEX, TROPONINI,  in the last 168 hours  BNP (last 3 results) No results found for this basename: PROBNP,  in the last 8760 hours CBG: No results found for this basename: GLUCAP,  in the last 168 hours  Radiological Exams on Admission: Dg Foot Complete Left  11/06/2013   CLINICAL DATA:  Bilateral foot pain and swelling without known injury chronic wound between in the third and fourth toe  EXAM: LEFT FOOT - COMPLETE 3+ VIEW  COMPARISON:  None  FINDINGS: The bones of the foot are osteopenic. There are mild degenerative interphalangeal joint changes present. There is loss of sharp cortical margin of the lateral aspect of the head of the third  metatarsal worrisome for osteomyelitis. The adjacent fourth digit is grossly normal. I cannot exclude minimal soft tissue gas lateral to the third metatarsophalangeal joint. More proximally the bones of the foot exhibit no lytic or blastic lesion. There is mild soft tissue swelling over the forefoot.  IMPRESSION: The findings are worrisome for osteomyelitis involving the lateral aspect of the head of the third metatarsal. There is overlying soft tissue swelling consistent with cellulitis.   Electronically Signed   By: David  [place]   On: 11/06/2013 16:04   Dg Foot Complete Right  11/06/2013   CLINICAL DATA:  Bilateral foot pain and swelling.  EXAM: RIGHT FOOT COMPLETE - 3+ VIEW  COMPARISON:  DG ANKLE COMPLETE*R* dated 04/15/2008; DG FOOT COMPLETE*R* dated 12/24/2003  FINDINGS: The bones of the right foot are osteopenic. There is a bony fragment along the ventral aspect of the distal half of the shaft of the first metatarsal. This is separate from the sesamoid bones. No donor site is demonstrated. There are mild degenerative changes of the  interphalangeal joint. Deformity of the DIP joint of the third toe is demonstrated. No erosive changes of the right foot similar to those noted on the left are demonstrated. The bones of the hindfoot exhibit no acute abnormality. Mild soft tissue swelling diffusely is present.  IMPRESSION: 1. There are no erosive changes suspicious for osteomyelitis. There is no soft tissue gas. 2. There is chronic deformity of the DIP joint of the third toe. There is mild degenerative change of the first metatarsophalangeal joint. 3. There is nonspecific bony density within the ventral aspect of the soft tissues adjacent to the shaft of the first metatarsal. It was present on a study from 2005.   Electronically Signed   By: David  [place]   On: 11/06/2013 16:10     Assessment/Plan Active Problems:   Probable left foot Osteomyelitis and right lower leg cellulitis -As discussed above, will  place on empiric antibiotics with vancomycin and Zosyn -Followup on MRI and ABI ordered per orthopedics -Dr. Roda ShuttersXu to see patient in a.m. for further recommendations   HTN (hypertension), benign -Continue outpatient medications   Alcohol abuse -Placed on Ativan CIWA protocol -Counseled to quit alcohol, but he has no interest in quitting at this time.   COPD (chronic obstructive pulmonary disease) -Stable, counseled to quit tobacco History of gout -Obtain uric acid level follow and further manage accordingly. -Continue pain management     Code Status: full Family Communication: Brother at bedside Disposition Plan: Admit to telemetry  Time spent: Greater than 30 minutes  [facility] Cancer CenterVIYUOH,Lyncoln Ledgerwood C [facility] Pager [phone]

## 2013-11-06 NOTE — ED Notes (Signed)
Pt c/o left foot pain, pt has drainage coming from toes, black necrotic tissue on middle toe, painful to touch. Pt has infected middle toe. [facility]

## 2013-11-07 ENCOUNTER — Inpatient Hospital Stay (HOSPITAL_COMMUNITY): Payer: Medicaid Other

## 2013-11-07 DIAGNOSIS — L97509 Non-pressure chronic ulcer of other part of unspecified foot with unspecified severity: Secondary | ICD-10-CM

## 2013-11-07 LAB — BASIC METABOLIC PANEL WITH GFR
BUN: 16 mg/dL (ref 6–23)
CO2: 26 meq/L (ref 19–32)
Calcium: 9 mg/dL (ref 8.4–10.5)
Chloride: 101 meq/L (ref 96–112)
Creatinine, Ser: 1.1 mg/dL (ref 0.50–1.35)
GFR calc Af Amer: 85 mL/min — ABNORMAL LOW
GFR calc non Af Amer: 73 mL/min — ABNORMAL LOW
Glucose, Bld: 110 mg/dL — ABNORMAL HIGH (ref 70–99)
Potassium: 4 meq/L (ref 3.7–5.3)
Sodium: 140 meq/L (ref 137–147)

## 2013-11-07 LAB — CBC
HCT: 36.7 % — ABNORMAL LOW (ref 39.0–52.0)
Hemoglobin: 12.4 g/dL — ABNORMAL LOW (ref 13.0–17.0)
MCH: 31.7 pg (ref 26.0–34.0)
MCHC: 33.8 g/dL (ref 30.0–36.0)
MCV: 93.9 fL (ref 78.0–100.0)
PLATELETS: 372 10*3/uL (ref 150–400)
RBC: 3.91 MIL/uL — ABNORMAL LOW (ref 4.22–5.81)
RDW: 14.8 % (ref 11.5–15.5)
WBC: 13.5 10*3/uL — ABNORMAL HIGH (ref 4.0–10.5)

## 2013-11-07 LAB — C-REACTIVE PROTEIN: CRP: 5.6 mg/dL — AB (ref <0.60)

## 2013-11-07 LAB — SEDIMENTATION RATE: SED RATE: 31 mm/h — AB (ref 0–16)

## 2013-11-07 MED ORDER — VANCOMYCIN HCL IN DEXTROSE 1-5 GM/200ML-% IV SOLN
1000.0000 mg | Freq: Two times a day (BID) | INTRAVENOUS | Status: DC
  Administered 2013-11-08 – 2013-11-10 (×6): 1000 mg via INTRAVENOUS
  Filled 2013-11-07 (×7): qty 200

## 2013-11-07 MED ORDER — HYDROMORPHONE HCL PF 1 MG/ML IJ SOLN
1.0000 mg | INTRAMUSCULAR | Status: DC | PRN
  Administered 2013-11-07 (×2): 1 mg via INTRAVENOUS
  Filled 2013-11-07 (×2): qty 1

## 2013-11-07 MED ORDER — GADOBENATE DIMEGLUMINE 529 MG/ML IV SOLN
18.0000 mL | Freq: Once | INTRAVENOUS | Status: AC | PRN
  Administered 2013-11-07: 18 mL via INTRAVENOUS

## 2013-11-07 MED ORDER — HYDROMORPHONE HCL PF 2 MG/ML IJ SOLN
1.5000 mg | INTRAMUSCULAR | Status: DC | PRN
  Administered 2013-11-07: 1.5 mg via INTRAVENOUS
  Filled 2013-11-07: qty 1

## 2013-11-07 NOTE — Progress Notes (Signed)
ANTIBIOTIC CONSULT NOTE - F/u   Pharmacy Consult for Vancomycin & Zosyn Indication: Osteomyelitis/Cellulitis  No Known Allergies  Patient Measurements: Height: 5\' 2"  (157.5 cm) Weight: 176 lb 1.6 oz (79.878 kg) IBW/kg (Calculated) : 54.6  Vital Signs: Temp: 98.3 F (36.8 C) (02/25 0427) Temp src: Oral (02/25 0427) BP: 128/81 mmHg (02/25 0926) Pulse Rate: 104 (02/25 0926) Intake/Output from previous day: 02/24 0701 - 02/25 0700 In: 801.3 [I.V.:601.3; IV Piggyback:200] Out: -  Intake/Output from this shift: Total I/O In: 200 [IV Piggyback:200] Out: 450 [Urine:450]  Labs:  Recent Labs  11/06/13 1612 11/07/13 0400  WBC 10.7* 13.5*  HGB 13.0 12.4*  PLT 379 372  CREATININE 0.72 1.10   Estimated Creatinine Clearance: 68.6 ml/min (by C-G formula based on Cr of 1.1). No results found for this basename: VANCOTROUGH, VANCOPEAK, VANCORANDOM, GENTTROUGH, GENTPEAK, GENTRANDOM, TOBRATROUGH, TOBRAPEAK, TOBRARND, AMIKACINPEAK, AMIKACINTROU, AMIKACIN,  in the last 72 hours   Microbiology: Recent Results (from the past 720 hour(s))  WOUND CULTURE     Status: None   Collection Time    11/06/13  4:16 PM      Result Value Ref Range Status   Specimen Description FOOT LEFT   Final   Special Requests Normal   Final   Gram Stain     Final   Value: NO WBC SEEN     NO SQUAMOUS EPITHELIAL CELLS SEEN     ABUNDANT GRAM NEGATIVE RODS     ABUNDANT GRAM POSITIVE COCCI     IN PAIRS   Culture     Final   Value: Culture reincubated for better growth     Performed at [facility]   Report Status PENDING   Incomplete    Medical History: Past Medical History  Diagnosis Date  . Gout   . Hypertension   . ETOH abuse     Medications:  Scheduled:  . diltiazem  240 mg Oral Daily  . folic acid  1 mg Oral Daily  . LORazepam  0-4 mg Oral Q6H   Followed by  . [START ON 11/08/2013] LORazepam  0-4 mg Oral Q12H  . multivitamin with minerals  1 tablet Oral Daily  .  piperacillin-tazobactam (ZOSYN)  IV  3.375 g Intravenous Q8H  . senna  1 tablet Oral BID  . sodium chloride  3 mL Intravenous Q12H  . thiamine  100 mg Oral Daily   Or  . thiamine  100 mg Intravenous Daily  . triamterene-hydrochlorothiazide  1 tablet Oral Daily  . vancomycin  1,000 mg Intravenous Q12H   Infusions:  . sodium chloride 75 mL/hr at 11/06/13 2259   Assessment:  [age] yr male with left foot pain.  Abscessed drained within the last month.   Presents with increasing pain in left foot.  Black, necrotic tissue on toe noted.  Films show osteomyelitis on 3rd left toe.  Plan for surgical intervention.  Vancomycin and Zosyn started in ED.  Upon admission continuation of antibiotics per pharmacy dosing ordered.  ABIs ordered, possible surgical intervention tonight.   D2 Antibiotics 2/24 >>Vanc >> 2/24 >>Zosyn >>   Tmax: Afeb WBCs: 10.7-->13.5K rising Renal: Scr small bump to 1.10, CrCl ~ 69 ml/min CG (76N)  2/24 wound: Gram stain: abundant GNRs, GPC in pairs, culture reincubated  Goal of Therapy:  Vancomycin trough level 15-20 mcg/ml  Plan:  Measure antibiotic drug levels at steady state Follow up culture results Zosyn 3.375gm IV q8h (each dose infused over 4 hrs) Reduce vancomycin to 1gram  IV q12h  Haynes Hoehn, PharmD, BCPS 11/07/2013, 1:16 PM  Pager: [phone]

## 2013-11-07 NOTE — Progress Notes (Signed)
Patient's HR jumped up to the 140's then go back down to 80's-90's then back up to 140's. Temp 102.8 orally. BP 124/ 68, HR 88, Oxygen saturation 90% on 2L , resp 16. Tylenol 650 PO given.  Ice packs applied to underarms and cool rag placed on forehead. Rechecked temp 99.8 and HR ranging from 80-90's. Patient is resting. Craige CottaKirby, NP notified. Will continue to monitor patient.

## 2013-11-07 NOTE — Progress Notes (Signed)
VASCULAR LAB PRELIMINARY  ARTERIAL  ABI completed:  Right ABI and waveforms within normal limits.  Unable to obtain pressures on the left foot due to pain.  Waveforms abnormal.    RIGHT    LEFT    PRESSURE WAVEFORM  PRESSURE WAVEFORM  BRACHIAL 147 Triphasic  BRACHIAL 144 Triphasic   DP   DP    AT 159 Triphasic  AT  Monophasic   PT 157 Triphasic  PT  Monophasic   PER   PER    GREAT TOE  NA GREAT TOE  NA    RIGHT LEFT  ABI 1.08      Paulena Servais, RVT 11/07/2013, 11:32 AM

## 2013-11-07 NOTE — Consult Note (Signed)
ORTHOPAEDIC CONSULTATION  REQUESTING PHYSICIAN: Leatha Gilding, MD  Chief Complaint: Left 3rd toe drainage  HPI: Ronald [name] is a 58 y.o. male who complains of left 3rd toe drainage for the past 3 weeks.  Denies fevers.  Had recent I&D by me that has healed.  He did not follow up with me in clinic since being discharged from the hospital.  Patient does drink a significant amount of ETOH at baseline.    Past Medical History  Diagnosis Date  . Gout   . Hypertension   . ETOH abuse    Past Surgical History  Procedure Laterality Date  . Leg surgery     History   Social History  . Marital Status: Single    Spouse Name: N/A    Number of Children: N/A  . Years of Education: N/A   Social History Main Topics  . Smoking status: Current Every Day Smoker -- 0.50 packs/day for 40 years    Types: Cigarettes  . Smokeless tobacco: Never Used  . Alcohol Use: 72.0 oz/week    120 Cans of beer per week     Comment: "A whole Bunch - I am a drunk"  . Drug Use: Yes    Special: Cocaine  . Sexual Activity: None   Other Topics Concern  . None   Social History Narrative  . None   History reviewed. No pertinent family history. No Known Allergies Prior to Admission medications   Medication Sig Start Date End Date Taking? Authorizing Provider  diltiazem (TIAZAC) 240 MG 24 hr capsule Take 1 capsule (240 mg total) by mouth daily. 09/26/13  Yes Rodolph Bong, MD  ibuprofen (ADVIL,MOTRIN) 200 MG tablet Take 200 mg by mouth every 6 (six) hours as needed for fever or moderate pain.   Yes Historical Provider, MD  Ibuprofen-Diphenhydramine Cit (ADVIL PM PO) Take 1 tablet by mouth at bedtime as needed.   Yes Historical Provider, MD  oxyCODONE (OXY IR/ROXICODONE) 5 MG immediate release tablet Take 1-2 tablets (5-10 mg total) by mouth every 3 (three) hours as needed for severe pain. 09/26/13  Yes Rodolph Bong, MD  triamterene-hydrochlorothiazide (MAXZIDE-25) 37.5-25 MG per tablet Take 1  tablet by mouth daily. 09/26/13  Yes Rodolph Bong, MD   Mr Foot Left W Wo Contrast  11/07/2013   CLINICAL DATA:  Foot infection.  Osteomyelitis.  EXAM: MRI OF THE LEFT FOREFOOT WITHOUT AND WITH CONTRAST  TECHNIQUE: Multiplanar, multisequence MR imaging was performed both before and after administration of intravenous contrast.  CONTRAST:  18mL MULTIHANCE GADOBENATE DIMEGLUMINE 529 MG/ML IV SOLN  COMPARISON:  DG FOOT COMPLETE*L* dated 11/06/2013; MR FOOT*L* WO/W CM dated 09/20/2013  FINDINGS: In the ball of the foot plantar to the second and third MTP joints, there is an area of necrotic nonenhancing tissue that measures 8 mm plantar to dorsal and just over 2 cm transverse. In long axis of the foot, this measures about 13 millimeters. This appears to have a ulceration/sinus tract extending to the skin surface. Tiny area fluid is present centrally within this necrotic tissue, which may represent a draining abscess or tiny recurrent abscess. Surrounding phlegmon is present extending up to the MTP joint capsules.  There is no convincing evidence of septic tenosynovitis or septic arthritis. Reactive bone marrow edema is present in the proximal phalanges and metatarsal heads of the second and third toes. The third metatarsal head and proximal phalanx show mild enhancement after contrast administration however there is no cortical erosion  to indicate osteomyelitis and despite the edema, the fatty marrow is relatively preserved.  The study is mildly degraded by motion artifact and technical parameters were modified to accommodate.  Compared to the prior exam, the abscess appears to have been drained accounting for development of a sinus tract.  IMPRESSION: 1. Interval drainage of plantar forefoot abscess. Approximately 2 cm area of necrotic tissue in the area of previous seen abscess with surrounding phlegmon and a tiny amount of fluid centrally. Tiny amount of fluid centrally in necrotic tissue may represent a tiny  recurrent abscess or draining abscess. 2. Reactive edema around the 2nd and 3rd MTP joints without osteomyelitis. 3. Forefoot cellulitis.   Electronically Signed   By: Andreas NewportGeoffrey  Lamke M.D.   On: 11/07/2013 08:15   Dg Foot Complete Left  11/06/2013   CLINICAL DATA:  Bilateral foot pain and swelling without known injury chronic wound between in the third and fourth toe  EXAM: LEFT FOOT - COMPLETE 3+ VIEW  COMPARISON:  None  FINDINGS: The bones of the foot are osteopenic. There are mild degenerative interphalangeal joint changes present. There is loss of sharp cortical margin of the lateral aspect of the head of the third metatarsal worrisome for osteomyelitis. The adjacent fourth digit is grossly normal. I cannot exclude minimal soft tissue gas lateral to the third metatarsophalangeal joint. More proximally the bones of the foot exhibit no lytic or blastic lesion. There is mild soft tissue swelling over the forefoot.  IMPRESSION: The findings are worrisome for osteomyelitis involving the lateral aspect of the head of the third metatarsal. There is overlying soft tissue swelling consistent with cellulitis.   Electronically Signed   By: David  [place]   On: 11/06/2013 16:04   Dg Foot Complete Right  11/06/2013   CLINICAL DATA:  Bilateral foot pain and swelling.  EXAM: RIGHT FOOT COMPLETE - 3+ VIEW  COMPARISON:  DG ANKLE COMPLETE*R* dated 04/15/2008; DG FOOT COMPLETE*R* dated 12/24/2003  FINDINGS: The bones of the right foot are osteopenic. There is a bony fragment along the ventral aspect of the distal half of the shaft of the first metatarsal. This is separate from the sesamoid bones. No donor site is demonstrated. There are mild degenerative changes of the interphalangeal joint. Deformity of the DIP joint of the third toe is demonstrated. No erosive changes of the right foot similar to those noted on the left are demonstrated. The bones of the hindfoot exhibit no acute abnormality. Mild soft tissue swelling  diffusely is present.  IMPRESSION: 1. There are no erosive changes suspicious for osteomyelitis. There is no soft tissue gas. 2. There is chronic deformity of the DIP joint of the third toe. There is mild degenerative change of the first metatarsophalangeal joint. 3. There is nonspecific bony density within the ventral aspect of the soft tissues adjacent to the shaft of the first metatarsal. It was present on a study from 2005.   Electronically Signed   By: David  [place]   On: 11/06/2013 16:10    Positive ROS: All other systems have been reviewed and were otherwise negative with the exception of those mentioned in the HPI and as above.  Physical Exam: General: Alert, no acute distress, nonseptic appearing Cardiovascular: no audible thrills Respiratory: No cyanosis, no use of accessory musculature GI: No organomegaly, abdomen is soft and non-tender Skin: No lesions in the area of chief complaint Neurologic: Sensation intact distally Psychiatric: Patient is competent for consent with normal mood and affect Lymphatic: No axillary or  cervical lymphadenopathy  MUSCULOSKELETAL:  LLE: - glossy, hairless skin from about distal 1/4 of the lower leg and down - nonpalpable and nondopplerable pulses - malodorous discharge in the 2nd webspace - no open wounds - healed plantar foot wound from previous I&D - no tissue crepitus - dark scab on dorsum of 3rd toe vs. Early gangrene - clinically ankylosed ankle   Assessment: Left 3rd toe drainage and possible gangrene  Plan: - ABIs unable to be performed on LLE due to pain - nonpalpable and nondopplerable pulses - concerned that patient has PVD and poor healing potential - MRI reviewed and is negative for osteo - toe will likely need surgical intervention eventually but recommend vascular surgery consult first to determine if patient is candidate for revascularization - if he is candidate, then toe amputation is an option; if not a candidate, may need  more proximal level of amputation - will follow - agree with empiric abx at this point  Thank you for the consult and the opportunity to see Ronald [name]. Glee Arvin, MD [facility] [phone] 5:03 PM

## 2013-11-07 NOTE — Treatment Plan (Signed)
Will await MRI results and will see patient later this am.   Please keep patient NPO for possible surgical intervention this pm. Hold heparin for now.  Ronald ReelN. [name], MD [facility] [phone] 7:33 AM

## 2013-11-07 NOTE — Progress Notes (Signed)
[facility]. Report called to MononaLonnie, RN, on 5W [facility]. [facility] called and made aware of need for transport.

## 2013-11-07 NOTE — Progress Notes (Signed)
PROGRESS NOTE   [place] L[place] NWG:[id number]:[id number] DOB: 1955-10-23 DOA: 11/06/2013 PCP: No PCP Per Patient   Assessment/Plan: Probable left foot Osteomyelitis and right lower leg cellulitis  - continue vancomycin and Zosyn  - plan to transfer patient to [facility] today for OR per ortho. HTN (hypertension), benign  - Continue outpatient medications  Alcohol abuse  - Placed on Ativan CIWA protocol  - Counseled to quit alcohol, but he has no interest in quitting at this time.  COPD (chronic obstructive pulmonary disease)  - Stable, counseled to quit tobacco  History of gout  - Obtain uric acid level follow and further manage accordingly.  - Continue pain management   Diet: NPO Fluids: NS DVT Prophylaxis: heparin on hold  Code Status: Full Family Communication: none  Disposition Plan: inpatient  Consultants:  Orthopedic surgery  Procedures:  none   Antibiotics Vancomycin 2/24 >> Zosyn 2/24 >>  HPI/Subjective: - complains of severe foot pain  Objective: Filed Vitals:   11/06/13 2011 11/07/13 0100 11/07/13 0215 11/07/13 0427  BP: 142/84 124/68  118/74  Pulse: 92 88  103  Temp: 98.7 F (37.1 C) 102.8 F (39.3 C) 99.8 F (37.7 C) 98.3 F (36.8 C)  TempSrc: Oral Oral Oral Oral  Resp: 20 16  20   Height: 5\' 2"  (1.575 m)     Weight: 79.878 kg (176 lb 1.6 oz)     SpO2: 100% 90%  93%    Intake/Output Summary (Last 24 hours) at 11/07/13 0848 Last data filed at 11/07/13 0700  Gross per 24 hour  Intake 801.25 ml  Output      0 ml  Net 801.25 ml   Filed Weights   11/06/13 2011  Weight: 79.878 kg (176 lb 1.6 oz)    Exam:  General:  NAD  Cardiovascular: regular rate and rhythm, without MRG  Respiratory: good air movement, clear to auscultation throughout, no wheezing, ronchi or rales  Abdomen: soft, not tender to palpation, positive bowel sounds  MSK: no peripheral edema; left foot tender to palpation, erythematous, swollen  Neuro: grossly non  focal  Data Reviewed: Basic Metabolic Panel:  Recent Labs Lab 11/06/13 1612 11/07/13 0400  NA 140 140  K 3.6* 4.0  CL 98 101  CO2 26 26  GLUCOSE 113* 110*  BUN 12 16  CREATININE 0.72 1.10  CALCIUM 9.7 9.0   Liver Function Tests:  Recent Labs Lab 11/06/13 1612  AST 15  ALT 11  ALKPHOS 79  BILITOT 0.2*  PROT 8.8*  ALBUMIN 4.4   CBC:  Recent Labs Lab 11/06/13 1612 11/07/13 0400  WBC 10.7* 13.5*  NEUTROABS 7.0  --   HGB 13.0 12.4*  HCT 37.8* 36.7*  MCV 91.5 93.9  PLT 379 372   Recent Results (from the past 240 hour(s))  WOUND CULTURE     Status: None   Collection Time    11/06/13  4:16 PM      Result Value Ref Range Status   Specimen Description FOOT LEFT   Final   Special Requests Normal   Final   Gram Stain     Final   Value: NO WBC SEEN     NO SQUAMOUS EPITHELIAL CELLS SEEN     ABUNDANT GRAM NEGATIVE RODS     ABUNDANT GRAM POSITIVE COCCI     IN PAIRS   Culture     Final   Value: Culture reincubated for better growth     Performed at [facility]  Report Status PENDING   Incomplete    Studies: Mr Foot Left W Wo Contrast  11/07/2013   CLINICAL DATA:  Foot infection.  Osteomyelitis.  EXAM: MRI OF THE LEFT FOREFOOT WITHOUT AND WITH CONTRAST  TECHNIQUE: Multiplanar, multisequence MR imaging was performed both before and after administration of intravenous contrast.  CONTRAST:  18mL MULTIHANCE GADOBENATE DIMEGLUMINE 529 MG/ML IV SOLN  COMPARISON:  DG FOOT COMPLETE*L* dated 11/06/2013; MR FOOT*L* WO/W CM dated 09/20/2013  FINDINGS: In the ball of the foot plantar to the second and third MTP joints, there is an area of necrotic nonenhancing tissue that measures 8 mm plantar to dorsal and just over 2 cm transverse. In long axis of the foot, this measures about 13 millimeters. This appears to have a ulceration/sinus tract extending to the skin surface. Tiny area fluid is present centrally within this necrotic tissue, which may represent a draining abscess or  tiny recurrent abscess. Surrounding phlegmon is present extending up to the MTP joint capsules.  There is no convincing evidence of septic tenosynovitis or septic arthritis. Reactive bone marrow edema is present in the proximal phalanges and metatarsal heads of the second and third toes. The third metatarsal head and proximal phalanx show mild enhancement after contrast administration however there is no cortical erosion to indicate osteomyelitis and despite the edema, the fatty marrow is relatively preserved.  The study is mildly degraded by motion artifact and technical parameters were modified to accommodate.  Compared to the prior exam, the abscess appears to have been drained accounting for development of a sinus tract.  IMPRESSION: 1. Interval drainage of plantar forefoot abscess. Approximately 2 cm area of necrotic tissue in the area of previous seen abscess with surrounding phlegmon and a tiny amount of fluid centrally. Tiny amount of fluid centrally in necrotic tissue may represent a tiny recurrent abscess or draining abscess. 2. Reactive edema around the 2nd and 3rd MTP joints without osteomyelitis. 3. Forefoot cellulitis.   Electronically Signed   By: Andreas Newport M.D.   On: 11/07/2013 08:15   Dg Foot Complete Left  11/06/2013   CLINICAL DATA:  Bilateral foot pain and swelling without known injury chronic wound between in the third and fourth toe  EXAM: LEFT FOOT - COMPLETE 3+ VIEW  COMPARISON:  None  FINDINGS: The bones of the foot are osteopenic. There are mild degenerative interphalangeal joint changes present. There is loss of sharp cortical margin of the lateral aspect of the head of the third metatarsal worrisome for osteomyelitis. The adjacent fourth digit is grossly normal. I cannot exclude minimal soft tissue gas lateral to the third metatarsophalangeal joint. More proximally the bones of the foot exhibit no lytic or blastic lesion. There is mild soft tissue swelling over the forefoot.   IMPRESSION: The findings are worrisome for osteomyelitis involving the lateral aspect of the head of the third metatarsal. There is overlying soft tissue swelling consistent with cellulitis.   Electronically Signed   By: David  [place]   On: 11/06/2013 16:04   Dg Foot Complete Right  11/06/2013   CLINICAL DATA:  Bilateral foot pain and swelling.  EXAM: RIGHT FOOT COMPLETE - 3+ VIEW  COMPARISON:  DG ANKLE COMPLETE*R* dated 04/15/2008; DG FOOT COMPLETE*R* dated 12/24/2003  FINDINGS: The bones of the right foot are osteopenic. There is a bony fragment along the ventral aspect of the distal half of the shaft of the first metatarsal. This is separate from the sesamoid bones. No donor site is demonstrated. There are  mild degenerative changes of the interphalangeal joint. Deformity of the DIP joint of the third toe is demonstrated. No erosive changes of the right foot similar to those noted on the left are demonstrated. The bones of the hindfoot exhibit no acute abnormality. Mild soft tissue swelling diffusely is present.  IMPRESSION: 1. There are no erosive changes suspicious for osteomyelitis. There is no soft tissue gas. 2. There is chronic deformity of the DIP joint of the third toe. There is mild degenerative change of the first metatarsophalangeal joint. 3. There is nonspecific bony density within the ventral aspect of the soft tissues adjacent to the shaft of the first metatarsal. It was present on a study from 2005.   Electronically Signed   By: David  [place]   On: 11/06/2013 16:10   Scheduled Meds: . diltiazem  240 mg Oral Daily  . folic acid  1 mg Oral Daily  . LORazepam  0-4 mg Oral Q6H   Followed by  . [START ON 11/08/2013] LORazepam  0-4 mg Oral Q12H  . multivitamin with minerals  1 tablet Oral Daily  . piperacillin-tazobactam (ZOSYN)  IV  3.375 g Intravenous Q8H  . senna  1 tablet Oral BID  . sodium chloride  3 mL Intravenous Q12H  . thiamine  100 mg Oral Daily   Or  . thiamine  100 mg Intravenous  Daily  . triamterene-hydrochlorothiazide  1 tablet Oral Daily  . vancomycin  750 mg Intravenous Q8H   Continuous Infusions: . sodium chloride 75 mL/hr at 11/06/13 2259   Active Problems:   Osteomyelitis   HTN (hypertension), benign   Alcohol abuse   Osteomyelitis of left foot   COPD (chronic obstructive pulmonary disease)  Time spent: 35  This note has been created with Education officer, environmental. Any transcriptional errors are unintentional.   Pamella Pert, MD [facility] Pager [phone]. If 7 PM - 7 AM, please contact night-coverage at [email or web address], password [email or web address] 11/07/2013, 8:48 AM  LOS: 1 day

## 2013-11-07 NOTE — Progress Notes (Signed)
[facility] transferred [facility] at this time. In possession of personal belongings and transfer packet.

## 2013-11-08 ENCOUNTER — Ambulatory Visit: Payer: Self-pay | Admitting: Podiatry

## 2013-11-08 DIAGNOSIS — I10 Essential (primary) hypertension: Secondary | ICD-10-CM

## 2013-11-08 NOTE — Progress Notes (Signed)
Pt admitted to unit at 1947 by stretcher. Pt alert and oriented. Skin intact. Left foot present with eschar on toes. Pt placed on bed alarm as pt states he fell a few months ago while drinking. Pt oriented to room and call bell, declined safety video. Call bell within reach. IV intact. Will continue to monitor pt per MD orders.

## 2013-11-08 NOTE — Progress Notes (Signed)
PROGRESS NOTE   [name] VHQ:[id number] DOB: 04-Jan-1956 DOA: 11/06/2013 PCP: No PCP Per Patient   Assessment/Plan: left foot abscess, early wet gangrene and right lower leg cellulitis  - continue vancomycin and Zosyn  - Ortho following - ABIs noted - VVS consulted this am, may need amputation -wound care  HTN (hypertension), benign  - Continue outpatient medications   Alcohol abuse  - Placed on Ativan CIWA protocol  - Counseled to quit alcohol, but he has no interest in quitting at this time.  -continue thiamine, MVI  COPD (chronic obstructive pulmonary disease)  - Stable, counseled to quit tobacco   History of gout   Hyperglycemia -check hbaic  Diet: NPO Fluids: NS DVT Prophylaxis: heparin on hold  Code Status: Full Family Communication: none  Disposition Plan: inpatient  Consultants:  Orthopedic surgery  VVS  Procedures:  none   Antibiotics Vancomycin 2/24 >> Zosyn 2/24 >>  HPI/Subjective: - complains of severe foot pain  Objective: Filed Vitals:   11/07/13 1527 11/07/13 1959 11/07/13 2336 11/08/13 0531  BP: 127/76 149/89 145/82 128/78  Pulse: 101 108 104 96  Temp: 100.2 F (37.9 C) 98.5 F (36.9 C) 99.2 F (37.3 C) 98.9 F (37.2 C)  TempSrc: Oral Oral Oral Oral  Resp: 18 18 18 18   Height:  5\' 2"  (1.575 m)    Weight:  79.425 kg (175 lb 1.6 oz)    SpO2: 90% 93% 92% 91%    Intake/Output Summary (Last 24 hours) at 11/08/13 0841 Last data filed at 11/08/13 0340  Gross per 24 hour  Intake  792.5 ml  Output   1525 ml  Net -732.5 ml   Filed Weights   11/06/13 2011 11/07/13 1959  Weight: 79.878 kg (176 lb 1.6 oz) 79.425 kg (175 lb 1.6 oz)    Exam:  General:  NAD  Cardiovascular: regular rate and rhythm, without MRG  Respiratory: good air movement, clear to auscultation throughout, no wheezing, ronchi or rales  Abdomen: soft, not tender to palpation, positive bowel sounds  MSK:  left foot tender to palpation,  erythematous, swollen, wet gangrenous appearance, unable to feel distal pulses  Neuro: grossly non focal  Data Reviewed: Basic Metabolic Panel:  Recent Labs Lab 11/06/13 1612 11/07/13 0400  NA 140 140  K 3.6* 4.0  CL 98 101  CO2 26 26  GLUCOSE 113* 110*  BUN 12 16  CREATININE 0.72 1.10  CALCIUM 9.7 9.0   Liver Function Tests:  Recent Labs Lab 11/06/13 1612  AST 15  ALT 11  ALKPHOS 79  BILITOT 0.2*  PROT 8.8*  ALBUMIN 4.4   CBC:  Recent Labs Lab 11/06/13 1612 11/07/13 0400  WBC 10.7* 13.5*  NEUTROABS 7.0  --   HGB 13.0 12.4*  HCT 37.8* 36.7*  MCV 91.5 93.9  PLT 379 372   Recent Results (from the past 240 hour(s))  WOUND CULTURE     Status: None   Collection Time    11/06/13  4:16 PM      Result Value Ref Range Status   Specimen Description FOOT LEFT   Final   Special Requests Normal   Final   Gram Stain     Final   Value: NO WBC SEEN     NO SQUAMOUS EPITHELIAL CELLS SEEN     ABUNDANT GRAM NEGATIVE RODS     ABUNDANT GRAM POSITIVE COCCI     IN PAIRS   Culture     Final   Value: Culture  reincubated for better growth     Performed at [facility]   Report Status PENDING   Incomplete    Studies: Mr Foot Left W Wo Contrast  11/07/2013   CLINICAL DATA:  Foot infection.  Osteomyelitis.  EXAM: MRI OF THE LEFT FOREFOOT WITHOUT AND WITH CONTRAST  TECHNIQUE: Multiplanar, multisequence MR imaging was performed both before and after administration of intravenous contrast.  CONTRAST:  18mL MULTIHANCE GADOBENATE DIMEGLUMINE 529 MG/ML IV SOLN  COMPARISON:  DG FOOT COMPLETE*L* dated 11/06/2013; MR FOOT*L* WO/W CM dated 09/20/2013  FINDINGS: In the ball of the foot plantar to the second and third MTP joints, there is an area of necrotic nonenhancing tissue that measures 8 mm plantar to dorsal and just over 2 cm transverse. In long axis of the foot, this measures about 13 millimeters. This appears to have a ulceration/sinus tract extending to the skin surface. Tiny  area fluid is present centrally within this necrotic tissue, which may represent a draining abscess or tiny recurrent abscess. Surrounding phlegmon is present extending up to the MTP joint capsules.  There is no convincing evidence of septic tenosynovitis or septic arthritis. Reactive bone marrow edema is present in the proximal phalanges and metatarsal heads of the second and third toes. The third metatarsal head and proximal phalanx show mild enhancement after contrast administration however there is no cortical erosion to indicate osteomyelitis and despite the edema, the fatty marrow is relatively preserved.  The study is mildly degraded by motion artifact and technical parameters were modified to accommodate.  Compared to the prior exam, the abscess appears to have been drained accounting for development of a sinus tract.  IMPRESSION: 1. Interval drainage of plantar forefoot abscess. Approximately 2 cm area of necrotic tissue in the area of previous seen abscess with surrounding phlegmon and a tiny amount of fluid centrally. Tiny amount of fluid centrally in necrotic tissue may represent a tiny recurrent abscess or draining abscess. 2. Reactive edema around the 2nd and 3rd MTP joints without osteomyelitis. 3. Forefoot cellulitis.   Electronically Signed   By: Andreas NewportGeoffrey  Lamke M.D.   On: 11/07/2013 08:15   Dg Foot Complete Left  11/06/2013   CLINICAL DATA:  Bilateral foot pain and swelling without known injury chronic wound between in the third and fourth toe  EXAM: LEFT FOOT - COMPLETE 3+ VIEW  COMPARISON:  None  FINDINGS: The bones of the foot are osteopenic. There are mild degenerative interphalangeal joint changes present. There is loss of sharp cortical margin of the lateral aspect of the head of the third metatarsal worrisome for osteomyelitis. The adjacent fourth digit is grossly normal. I cannot exclude minimal soft tissue gas lateral to the third metatarsophalangeal joint. More proximally the bones of  the foot exhibit no lytic or blastic lesion. There is mild soft tissue swelling over the forefoot.  IMPRESSION: The findings are worrisome for osteomyelitis involving the lateral aspect of the head of the third metatarsal. There is overlying soft tissue swelling consistent with cellulitis.   Electronically Signed   By: David  [place]   On: 11/06/2013 16:04   Dg Foot Complete Right  11/06/2013   CLINICAL DATA:  Bilateral foot pain and swelling.  EXAM: RIGHT FOOT COMPLETE - 3+ VIEW  COMPARISON:  DG ANKLE COMPLETE*R* dated 04/15/2008; DG FOOT COMPLETE*R* dated 12/24/2003  FINDINGS: The bones of the right foot are osteopenic. There is a bony fragment along the ventral aspect of the distal half of the shaft of the first  metatarsal. This is separate from the sesamoid bones. No donor site is demonstrated. There are mild degenerative changes of the interphalangeal joint. Deformity of the DIP joint of the third toe is demonstrated. No erosive changes of the right foot similar to those noted on the left are demonstrated. The bones of the hindfoot exhibit no acute abnormality. Mild soft tissue swelling diffusely is present.  IMPRESSION: 1. There are no erosive changes suspicious for osteomyelitis. There is no soft tissue gas. 2. There is chronic deformity of the DIP joint of the third toe. There is mild degenerative change of the first metatarsophalangeal joint. 3. There is nonspecific bony density within the ventral aspect of the soft tissues adjacent to the shaft of the first metatarsal. It was present on a study from 2005.   Electronically Signed   By: David  [place]   On: 11/06/2013 16:10   Scheduled Meds: . diltiazem  240 mg Oral Daily  . folic acid  1 mg Oral Daily  . LORazepam  0-4 mg Oral Q6H   Followed by  . LORazepam  0-4 mg Oral Q12H  . multivitamin with minerals  1 tablet Oral Daily  . piperacillin-tazobactam (ZOSYN)  IV  3.375 g Intravenous Q8H  . senna  1 tablet Oral BID  . sodium chloride  3 mL  Intravenous Q12H  . thiamine  100 mg Oral Daily   Or  . thiamine  100 mg Intravenous Daily  . triamterene-hydrochlorothiazide  1 tablet Oral Daily  . vancomycin  1,000 mg Intravenous Q12H   Continuous Infusions: . sodium chloride 75 mL/hr at 11/07/13 2253   Active Problems:   Osteomyelitis   HTN (hypertension), benign   Alcohol abuse   Osteomyelitis of left foot   COPD (chronic obstructive pulmonary disease)  Time spent: 35  This note has been created with Education officer, environmental. Any transcriptional errors are unintentional.   Zannie Cove, MD [facility] Pager [phone]. If 7 PM - 7 AM, please contact night-coverage at [email or web address], password [facility] 11/08/2013, 8:41 AM  LOS: 2 days

## 2013-11-08 NOTE — ED Provider Notes (Signed)
Medical screening examination/treatment/procedure(s) were conducted as a shared visit with non-physician practitioner(s) and myself.  I personally evaluated the patient during the encounter.    Patient with diabetes and concern for new osteo. He is hemodynamically stable. We'll give IV antibiotics it was recommendation admit to the hospitalist.  Audree CamelScott T [place], MD 11/08/13 [phone]

## 2013-11-09 DIAGNOSIS — L02619 Cutaneous abscess of unspecified foot: Secondary | ICD-10-CM

## 2013-11-09 DIAGNOSIS — M25579 Pain in unspecified ankle and joints of unspecified foot: Secondary | ICD-10-CM

## 2013-11-09 DIAGNOSIS — I10 Essential (primary) hypertension: Secondary | ICD-10-CM

## 2013-11-09 DIAGNOSIS — F1010 Alcohol abuse, uncomplicated: Secondary | ICD-10-CM

## 2013-11-09 DIAGNOSIS — I739 Peripheral vascular disease, unspecified: Secondary | ICD-10-CM

## 2013-11-09 DIAGNOSIS — M869 Osteomyelitis, unspecified: Secondary | ICD-10-CM

## 2013-11-09 DIAGNOSIS — L03119 Cellulitis of unspecified part of limb: Secondary | ICD-10-CM

## 2013-11-09 DIAGNOSIS — L98499 Non-pressure chronic ulcer of skin of other sites with unspecified severity: Secondary | ICD-10-CM

## 2013-11-09 LAB — HEMOGLOBIN A1C
HEMOGLOBIN A1C: 5.9 % — AB (ref <5.7)
MEAN PLASMA GLUCOSE: 123 mg/dL — AB (ref <117)

## 2013-11-09 LAB — WOUND CULTURE
GRAM STAIN: NONE SEEN
Special Requests: NORMAL

## 2013-11-09 MED ORDER — HYDROMORPHONE HCL PF 1 MG/ML IJ SOLN
1.0000 mg | INTRAMUSCULAR | Status: DC | PRN
  Administered 2013-11-10: 1 mg via INTRAVENOUS
  Filled 2013-11-09: qty 1

## 2013-11-09 NOTE — Consult Note (Addendum)
[facility] NOTE   MRN : [id number]  Reason for Consult: pain in left foot. Black, necrotic tissue ischemia  Referring Physician: Zannie Cove, MD   History of Present Illness: 58 y/o male with CC: of left foot pain and swelling.  He was last discharged from the hospital on 1/13 following admission for cellulitis and abscess of left foot who presents with above complaints. Patient states that he completed the antibiotics prescribed on discharge, but it is noted that he did not follow up with Dr. Roda Shutters as he was directed. He states that over the past 3 weeks pain in the left foot has progressively worsened with increasing drainage as well. He denies fevers.  Orthopedics was consulted and Dr. August Saucer saw patient and recommended an MRI of the right foot as well as ABI. He is admitted for further evaluation and management. We have been consulted for ischemic changes to the left foot.  Past medical includes hypertension which is medically managed, gout and alcohol abuse.  He is a current smoker.  He denise hyperlipidemia and DM.  Musculoskeletal surgery history bilateral ankle fusions per patient reason unknown.       Current Facility-Administered Medications  Medication Dose Route Frequency Provider Last Rate Last Dose  . 0.9 %  sodium chloride infusion   Intravenous Continuous Kela Millin, MD 75 mL/hr at 11/07/13 2253    . acetaminophen (TYLENOL) tablet 650 mg  650 mg Oral Q6H PRN Kela Millin, MD   650 mg at 11/07/13 0208   Or  . acetaminophen (TYLENOL) suppository 650 mg  650 mg Rectal Q6H PRN Adeline C Viyuoh, MD      . albuterol (PROVENTIL) (2.5 MG/3ML) 0.083% nebulizer solution 2.5 mg  2.5 mg Nebulization Q4H PRN Adeline C Viyuoh, MD      . diltiazem (TIAZAC) 24 hr capsule 240 mg  240 mg Oral Daily Kela Millin, MD   240 mg at 11/08/13 0951  . folic acid (FOLVITE) tablet 1 mg  1 mg Oral Daily Kela Millin, MD   1 mg at 11/08/13 0951  .  HYDROmorphone (DILAUDID) injection 1.5 mg  1.5 mg Intravenous Q2H PRN Leatha Gilding, MD   1.5 mg at 11/07/13 1518  . LORazepam (ATIVAN) tablet 1 mg  1 mg Oral Q6H PRN Kela Millin, MD       Or  . LORazepam (ATIVAN) injection 1 mg  1 mg Intravenous Q6H PRN Adeline C Viyuoh, MD      . LORazepam (ATIVAN) tablet 0-4 mg  0-4 mg Oral Q12H Adeline C Viyuoh, MD      . multivitamin with minerals tablet 1 tablet  1 tablet Oral Daily Kela Millin, MD   1 tablet at 11/08/13 0951  . ondansetron (ZOFRAN) tablet 4 mg  4 mg Oral Q6H PRN Adeline C Viyuoh, MD       Or  . ondansetron (ZOFRAN) injection 4 mg  4 mg Intravenous Q6H PRN Adeline C Viyuoh, MD      . oxyCODONE (Oxy IR/ROXICODONE) immediate release tablet 5-10 mg  5-10 mg Oral Q3H PRN Kela Millin, MD   10 mg at 11/09/13 0448  . piperacillin-tazobactam (ZOSYN) IVPB 3.375 g  3.375 g Intravenous Q8H Leann Trefz Poindexter, RPH   3.375 g at 11/09/13 0501  . senna (SENOKOT) tablet 8.6 mg  1 tablet Oral BID Kela Millin, MD   8.6 mg at 11/08/13 2148  . sodium chloride 0.9 %  injection 3 mL  3 mL Intravenous Q12H Kela Millin, MD   3 mL at 11/07/13 2254  . thiamine (VITAMIN B-1) tablet 100 mg  100 mg Oral Daily Adeline C Viyuoh, MD   100 mg at 11/08/13 0951   Or  . thiamine (B-1) injection 100 mg  100 mg Intravenous Daily Kela Millin, MD   100 mg at 11/06/13 2257  . triamterene-hydrochlorothiazide (MAXZIDE-25) 37.5-25 MG per tablet 1 tablet  1 tablet Oral Daily Kela Millin, MD   1 tablet at 11/08/13 0951  . vancomycin (VANCOCIN) IVPB 1000 mg/200 mL premix  1,000 mg Intravenous Q12H Colleen E Summe, RPH   1,000 mg at 11/09/13 0101    Pt meds include: Statin :No Betablocker: No ASA: No Other anticoagulants/antiplatelets:   Past Medical History  Diagnosis Date  . Gout   . Hypertension   . ETOH abuse     Past Surgical History  Procedure Laterality Date  . Leg surgery      Social History History  Substance Use  Topics  . Smoking status: Current Every Day Smoker -- 0.50 packs/day for 40 years    Types: Cigarettes  . Smokeless tobacco: Never Used  . Alcohol Use: 72.0 oz/week    120 Cans of beer per week     Comment: "A whole Bunch - I am a drunk"    Family History Mother DM, HTN Father HTN  No Known Allergies   REVIEW OF SYSTEMS  General: [ ]  Weight loss, [ ]  Fever, [ ]  chills Neurologic: [ ]  Dizziness, [ ]  Blackouts, [ ]  Seizure [ ]  Stroke, [ ]  "Mini stroke", [ ]  Slurred speech, [ ]  Temporary blindness; [ ]  weakness in arms or legs, [ ]  Hoarseness [ ]  Dysphagia Cardiac: [ ]  Chest pain/pressure, [ ]  Shortness of breath at rest [ ]  Shortness of breath with exertion, [ ]  Atrial fibrillation or irregular heartbeat  Vascular: [x ] Pain in legs with walking, [ ]  Pain in legs at rest, [ ]  Pain in legs at night,  [x ] Non-healing ulcer, [ ]  Blood clot in vein/DVT,   Pulmonary: [ ]  Home oxygen, [ ]  Productive cough, [ ]  Coughing up blood, [ ]  Asthma,  [ ]  Wheezing [ ]  COPD Musculoskeletal:  [x ] Arthritis, [ ]  Low back pain, [ ]  Joint pain Hematologic: [ ]  Easy Bruising, [ ]  Anemia; [ ]  Hepatitis Gastrointestinal: [ ]  Blood in stool, [ ]  Gastroesophageal Reflux/heartburn, Urinary: [ ]  chronic Kidney disease, [ ]  on HD - [ ]  MWF or [ ]  TTHS, [ ]  Burning with urination, [ ]  Difficulty urinating Skin: [ ]  Rashes, [x ] Wounds left foot Psychological: [ ]  Anxiety, [ ]  Depression  Physical Examination Filed Vitals:   11/08/13 0914 11/08/13 1303 11/08/13 2229 11/09/13 0444  BP: 133/69 133/82 142/88 143/84  Pulse: 92 94 98 97  Temp: 98.4 F (36.9 C) 98.6 F (37 C) 99.2 F (37.3 C) 98.6 F (37 C)  TempSrc: Oral Oral Oral Oral  Resp:  18 18 18   Height:      Weight:      SpO2: 93% 93% 95% 94%   Body mass index is 32.02 kg/(m^2).  General:  WDWN in NAD HENT: WNL Eyes: Pupils equal Pulmonary: normal non-labored breathing , without Rales, rhonchi,  wheezing Cardiac: RRR, without  Murmurs,  rubs or gallops; No carotid bruits Abdomen: soft, NT, no masses Skin: Left foot edema with third toe ulcer, black eschar  Vascular Exam/Pulses:Palpable bilateral femoral pulses, palpable right DP pulse. Non palpable left foot pulses, toes are cool to touch.     Musculoskeletal: positive bilateral lower extremity muscle wasting or atrophy; positive edema left > right foot edema  Neurologic: A&O X 3; Appropriate Affect ;  SENSATION: normal; MOTOR FUNCTION: 5/5 Symmetric from the knees and up. Speech is fluent/normal   Significant Diagnostic Studies: CBC Lab Results  Component Value Date   WBC 13.5* 11/07/2013   HGB 12.4* 11/07/2013   HCT 36.7* 11/07/2013   MCV 93.9 11/07/2013   PLT 372 11/07/2013    BMET    Component Value Date/Time   NA 140 11/07/2013 0400   K 4.0 11/07/2013 0400   CL 101 11/07/2013 0400   CO2 26 11/07/2013 0400   GLUCOSE 110* 11/07/2013 0400   BUN 16 11/07/2013 0400   CREATININE 1.10 11/07/2013 0400   CALCIUM 9.0 11/07/2013 0400   GFRNONAA 73* 11/07/2013 0400   GFRAA 85* 11/07/2013 0400   Estimated Creatinine Clearance: 68.4 ml/min (by C-G formula based on Cr of 1.1).  COAG No results found for this basename: INR, PROTIME     Non-Invasive Vascular Imaging:  ABI: Unable to obtain pressures on the left foot due to pain. 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 Unable to tolerate ABI, may need angiogram to decide by-pass possibility verses imputation. I will discuss this with Dr. Diamond NickelBrabham.   COLLINS, EMMA [facility] 11/09/2013 9:29 AM  I agree with the above.  I have seen and examined the patient.  He has a left 3rd toe wound and small wound on the dorsum of the foot.  MRI shows no definitive osteo but phlegmon around previously drained abscess.  HE has forefoot cellulitis.  His vascular lab studies were limited secondary to pain but did show monophasic waveforms.  He will need  angiography to better define his vascular anatomy.  He will likely require re-vascularization in attempt to salvage his foot.  I will put him on the schedule for Monday.  If the patient desires to leave the hospital, he could come and have this done as an outpatient.  My preference would be for him to remain hospitalized with IV ABx.  WElls Lorraina Spring

## 2013-11-09 NOTE — Progress Notes (Signed)
PROGRESS NOTE   [name]:[id number] DOB: 04/06/1956 DOA: 11/06/2013 PCP: No PCP Per Patient   Assessment/Plan: left foot abscess, early wet gangrene and right lower leg cellulitis  - continue vancomycin and Zosyn  - Ortho following, Vascular consulted - ABIs monophasic in L leg - may need amputation - wound care - patient wanting to discharge home, but i told him that this issue needs to be addressed before discharge  HTN (hypertension), benign  - Continue outpatient medications   Alcohol abuse  - Placed on Ativan CIWA protocol  - Counseled to quit alcohol, but he has no interest in quitting at this time.  -continue thiamine, MVI  COPD (chronic obstructive pulmonary disease)  - Stable, counseled to quit tobacco   History of gout   Hyperglycemia -check hbaic  DVT Prophylaxis: heparin on hold  Code Status: Full Family Communication: none  Disposition Plan:home pending further management  Consultants:  Orthopedic surgery  VVS  Procedures:  none   Antibiotics Vancomycin 2/24 >> Zosyn 2/24 >>  HPI/Subjective: - complains of severe foot pain  Objective: Filed Vitals:   11/08/13 2229 11/09/13 0444 11/09/13 1042 11/09/13 1210  BP: 142/88 143/84 125/75 150/91  Pulse: 98 97 86 107  Temp: 99.2 F (37.3 C) 98.6 F (37 C)  98.9 F (37.2 C)  TempSrc: Oral Oral  Oral  Resp: 18 18  18   Height:      Weight:      SpO2: 95% 94%  97%    Intake/Output Summary (Last 24 hours) at 11/09/13 1240 Last data filed at 11/09/13 0700  Gross per 24 hour  Intake    480 ml  Output   1375 ml  Net   -895 ml   Filed Weights   11/06/13 2011 11/07/13 1959  Weight: 79.878 kg (176 lb 1.6 oz) 79.425 kg (175 lb 1.6 oz)    Exam:  General:  NAD  Cardiovascular: regular rate and rhythm, without MRG  Respiratory: good air movement, clear to auscultation throughout, no wheezing, ronchi or rales  Abdomen: soft, not tender to palpation, positive bowel  sounds  MSK:  left foot tender to palpation, erythematous, swollen, wet gangrenous appearance, unable to feel distal pulses  Neuro: grossly non focal  Data Reviewed: Basic Metabolic Panel:  Recent Labs Lab 11/06/13 1612 11/07/13 0400  NA 140 140  K 3.6* 4.0  CL 98 101  CO2 26 26  GLUCOSE 113* 110*  BUN 12 16  CREATININE 0.72 1.10  CALCIUM 9.7 9.0   Liver Function Tests:  Recent Labs Lab 11/06/13 1612  AST 15  ALT 11  ALKPHOS 79  BILITOT 0.2*  PROT 8.8*  ALBUMIN 4.4   CBC:  Recent Labs Lab 11/06/13 1612 11/07/13 0400  WBC 10.7* 13.5*  NEUTROABS 7.0  --   HGB 13.0 12.4*  HCT 37.8* 36.7*  MCV 91.5 93.9  PLT 379 372   Recent Results (from the past 240 hour(s))  WOUND CULTURE     Status: None   Collection Time    11/06/13  4:16 PM      Result Value Ref Range Status   Specimen Description FOOT LEFT   Final   Special Requests Normal   Final   Gram Stain     Final   Value: NO WBC SEEN     NO SQUAMOUS EPITHELIAL CELLS SEEN     ABUNDANT GRAM NEGATIVE RODS     ABUNDANT GRAM POSITIVE COCCI     IN PAIRS  Culture     Final   Value: MULTIPLE ORGANISMS PRESENT, NONE PREDOMINANT NO STAPHYLOCOCCUS AUREUS ISOLATED NO GROUP A STREP (S.PYOGENES) ISOLATED     Performed at [facility]   Report Status 11/09/2013 FINAL   Final    Studies: No results found. Scheduled Meds: . diltiazem  240 mg Oral Daily  . folic acid  1 mg Oral Daily  . LORazepam  0-4 mg Oral Q12H  . multivitamin with minerals  1 tablet Oral Daily  . piperacillin-tazobactam (ZOSYN)  IV  3.375 g Intravenous Q8H  . senna  1 tablet Oral BID  . sodium chloride  3 mL Intravenous Q12H  . thiamine  100 mg Oral Daily   Or  . thiamine  100 mg Intravenous Daily  . triamterene-hydrochlorothiazide  1 tablet Oral Daily  . vancomycin  1,000 mg Intravenous Q12H   Continuous Infusions: . sodium chloride 75 mL/hr at 11/07/13 2253   Active Problems:   Osteomyelitis   HTN (hypertension), benign    Alcohol abuse   Osteomyelitis of left foot   COPD (chronic obstructive pulmonary disease)  Time spent: 35  This note has been created with Education officer, environmentalDragon speech recognition software and smart phrase technology. Any transcriptional errors are unintentional.   Zannie CovePreetha Moya Duan, MD [facility] Pager [phone]. If 7 PM - 7 AM, please contact night-coverage at [email or web address], password [email or web address] 11/09/2013, 12:40 PM  LOS: 3 days

## 2013-11-09 NOTE — Progress Notes (Addendum)
Vancomcyin and zosyn per pharmacy  Admit Complaint:57 yr male with left foot pain. Abscessed drained within the last month. Presents with increasing pain in left foot. Black, necrotic tissue on toe noted. Films show osteomyelitis on 3rd left toe. Plan for surgical intervention. [facility]  Infectious Disease:left foot abscess, early wet gangrene and right lower leg cellulitis, L 3rd toe osteo. Tmax 99.2. WBC 13.5 up on 02/25. CRP 5.6.  May need amputation  Vanco 2/24>> Zosyn 2/24>>  Nephrology: Scr 0.72>>1.1 watch on 02/25. UOP 0.8 ml/kg/hr  Goal vancomycin trough 15-20  Plan:  1) Cont vancomycin 1g iv q12h and zosyn 3.375g iv q8h (4h infusion) 2) Will get vancomcyin trough at 1130 tomorrow if cont ABX

## 2013-11-09 NOTE — Progress Notes (Signed)
Will await vascular's final recommendations. Continue abx for now.  Ronald [name]. [name], MD [facility] [phone] 6:01 PM

## 2013-11-09 NOTE — Progress Notes (Addendum)
Earlier this afternoon resident was found in the room leaning over the sink at 1345, IV was bleeding. Resident stated "I tripped over my IV getting up from the commode". I then asked pt. If he fell, he stated "I fell on my butt." The fall was not witnessed. Minor scratch was noted to Right elbow, no bleeding noted. Doctor notified, vitals stable. Pt. Was given one on one and repeatedly reminded to ring for help. Pt. Moved closer to the nursing station. Attempt to contact brother. Will pass it along in report. Call bell is within reach. Will continue to monitor.

## 2013-11-10 DIAGNOSIS — M869 Osteomyelitis, unspecified: Secondary | ICD-10-CM

## 2013-11-10 DIAGNOSIS — L03119 Cellulitis of unspecified part of limb: Secondary | ICD-10-CM

## 2013-11-10 DIAGNOSIS — L02619 Cutaneous abscess of unspecified foot: Secondary | ICD-10-CM

## 2013-11-10 DIAGNOSIS — L0390 Cellulitis, unspecified: Secondary | ICD-10-CM

## 2013-11-10 DIAGNOSIS — F1010 Alcohol abuse, uncomplicated: Secondary | ICD-10-CM

## 2013-11-10 DIAGNOSIS — L0291 Cutaneous abscess, unspecified: Secondary | ICD-10-CM

## 2013-11-10 LAB — VANCOMYCIN, TROUGH: Vancomycin Tr: 8.5 ug/mL — ABNORMAL LOW (ref 10.0–20.0)

## 2013-11-10 MED ORDER — VANCOMYCIN HCL 10 G IV SOLR
1250.0000 mg | Freq: Two times a day (BID) | INTRAVENOUS | Status: DC
  Filled 2013-11-10: qty 1250

## 2013-11-10 NOTE — Progress Notes (Signed)
Patient has signed AMA paper and states he is waiting on his brother to come pick him up. Refuses for bed alarm to be placed. Will continue to monitor.  Tajuanna Burnett J. Lendell CapriceSullivan RN

## 2013-11-10 NOTE — Progress Notes (Signed)
Vancomycin per pharmacy  Admit Complaint:57 yr male with left foot pain. Abscessed drained within the last month. Presents with increasing pain in left foot. Black, necrotic tissue on toe noted. Films show osteomyelitis on 3rd left toe. Plan for surgical intervention. [facility]   Infectious Disease:left foot abscess, early wet gangrene and right lower leg cellulitis, L 3rd toe osteo. Tmax 100.8 WBC 13.5 up on 02/25. CRP 5.6. Unable to tolerate ABI, may need angiogram to decide by-pass possibility verses amputation.  Vancomycin trough today is subtherapeutic at 8.5  Vanco 2/24>> Zosyn 2/24>>  02/28: VT is 8.5  Goal vancomycin trough 15-20  Nephrology: Scr 0.72>>1.1 watch on 02/25. UOP 0.7 ml/kg/hr  Plan:  1) Increase vancomycin to 1250mg  iv q12h and reck vancomycin trough with 5th new dose 2) Continue zosyn 3.375g iv q8h (4h infusion)

## 2013-11-10 NOTE — Progress Notes (Signed)
Patient demanded that IV be removed. Brother is at bedside and states that he will take his brother home.

## 2013-11-10 NOTE — Progress Notes (Signed)
PROGRESS NOTE   [place] Gold[place] NGE:[id number] DOB: 1955/10/20 DOA: 11/06/2013 PCP: No PCP Per Patient   Assessment/Plan: left foot abscess, early wet gangrene and right lower leg cellulitis  - continue vancomycin and Zosyn  - Ortho following, Vascular following - plan for Angiogram per [facility] Monday - may need amputation - wound care - patient wanting to discharge home, but i told him that this issue needs to be addressed before discharge  HTN (hypertension), benign  - Continue outpatient medications   Alcohol abuse  - Placed on Ativan CIWA protocol  - Counseled to quit alcohol, but he has no interest in quitting at this time.  -continue thiamine, MVI  COPD (chronic obstructive pulmonary disease)  - Stable, counseled to quit tobacco   History of gout   Hyperglycemia -hbaic 5.9  DVT Prophylaxis: heparin on hold  Code Status: Full Family Communication: none  Disposition Plan:home pending further management  Consultants:  Orthopedic surgery  [facility]  Procedures:  none   Antibiotics Vancomycin 2/24 >> Zosyn 2/24 >>  HPI/Subjective: - threatening to leave  Objective: Filed Vitals:   11/09/13 2108 11/10/13 0555 11/10/13 1200 11/10/13 1501  BP: 150/88 141/77 154/86 142/88  Pulse: 102 91 92 91  Temp: 100.8 F (38.2 C) 97.1 F (36.2 C)  98.2 F (36.8 C)  TempSrc: Oral Oral  Oral  Resp: 18 18  18   Height:      Weight:      SpO2: 95% 93%  96%    Intake/Output Summary (Last 24 hours) at 11/10/13 1531 Last data filed at 11/10/13 1208  Gross per 24 hour  Intake 6823.75 ml  Output   1300 ml  Net 5523.75 ml   Filed Weights   11/06/13 2011 11/07/13 1959  Weight: 79.878 kg (176 lb 1.6 oz) 79.425 kg (175 lb 1.6 oz)    Exam:  General:  NAD  Cardiovascular: regular rate and rhythm, without MRG  Respiratory: good air movement, clear to auscultation throughout, no wheezing, ronchi or rales  Abdomen: soft, not tender to palpation, positive bowel  sounds  MSK:  left foot tender to palpation, erythematous, swollen, wet gangrenous appearance, unable to feel distal pulses  Neuro: grossly non focal  Data Reviewed: Basic Metabolic Panel:  Recent Labs Lab 11/06/13 1612 11/07/13 0400  NA 140 140  K 3.6* 4.0  CL 98 101  CO2 26 26  GLUCOSE 113* 110*  BUN 12 16  CREATININE 0.72 1.10  CALCIUM 9.7 9.0   Liver Function Tests:  Recent Labs Lab 11/06/13 1612  AST 15  ALT 11  ALKPHOS 79  BILITOT 0.2*  PROT 8.8*  ALBUMIN 4.4   CBC:  Recent Labs Lab 11/06/13 1612 11/07/13 0400  WBC 10.7* 13.5*  NEUTROABS 7.0  --   HGB 13.0 12.4*  HCT 37.8* 36.7*  MCV 91.5 93.9  PLT 379 372   Recent Results (from the past 240 hour(s))  WOUND CULTURE     Status: None   Collection Time    11/06/13  4:16 PM      Result Value Ref Range Status   Specimen Description FOOT LEFT   Final   Special Requests Normal   Final   Gram Stain     Final   Value: NO WBC SEEN     NO SQUAMOUS EPITHELIAL CELLS SEEN     ABUNDANT GRAM NEGATIVE RODS     ABUNDANT GRAM POSITIVE COCCI     IN PAIRS   Culture  Final   Value: MULTIPLE ORGANISMS PRESENT, NONE PREDOMINANT NO STAPHYLOCOCCUS AUREUS ISOLATED NO GROUP A STREP (S.PYOGENES) ISOLATED     Performed at [facility]   Report Status 11/09/2013 FINAL   Final    Studies: No results found. Scheduled Meds: . diltiazem  240 mg Oral Daily  . folic acid  1 mg Oral Daily  . multivitamin with minerals  1 tablet Oral Daily  . piperacillin-tazobactam (ZOSYN)  IV  3.375 g Intravenous Q8H  . senna  1 tablet Oral BID  . sodium chloride  3 mL Intravenous Q12H  . thiamine  100 mg Oral Daily   Or  . thiamine  100 mg Intravenous Daily  . triamterene-hydrochlorothiazide  1 tablet Oral Daily  . vancomycin  1,250 mg Intravenous Q12H   Continuous Infusions: . sodium chloride 75 mL/hr at 11/10/13 1124   Active Problems:   Osteomyelitis   HTN (hypertension), benign   Alcohol abuse   Osteomyelitis of  left foot   COPD (chronic obstructive pulmonary disease)  Time spent: 35  This note has been created with Education officer, environmental. Any transcriptional errors are unintentional.   Zannie Cove, MD [facility] Pager [phone]. If 7 PM - 7 AM, please contact night-coverage at [email or web address], password [facility] 11/10/2013, 3:31 PM  LOS: 4 days

## 2013-11-12 ENCOUNTER — Ambulatory Visit (HOSPITAL_COMMUNITY): Admit: 2013-11-12 | Payer: Self-pay | Admitting: Vascular Surgery

## 2013-11-12 ENCOUNTER — Encounter (HOSPITAL_COMMUNITY): Payer: Self-pay

## 2013-11-12 SURGERY — ANGIOGRAM, LOWER EXTREMITY
Anesthesia: LOCAL

## 2013-11-12 NOTE — Care Management Note (Signed)
    Page 1 of 1   11/12/2013     6:09:44 PM   CARE MANAGEMENT NOTE 11/12/2013  Patient:  Ronald [name],Ronald [name]   Account Number:  [id number]  Date Initiated:  11/07/2013  Documentation initiated by:  Ezekiel InaMcGIBBONEY,COOKIE  Subjective/Objective Assessment:   PT ADMITTED WITH [facility] Worsening left foot pain with drainage     Action/Plan:   FROM HOME   Anticipated DC Date:  11/10/2013   Anticipated DC Plan:  HOME W HOME HEALTH SERVICES      DC Planning Services  CM consult      Choice offered to / List presented to:             Status of service:  Completed, signed off Medicare Important Message given?  NA - LOS <3 / Initial given by admissions (If response is "NO", the following Medicare IM given date fields will be blank) Date Medicare IM given:   Date Additional Medicare IM given:    Discharge Disposition:  HOME/SELF CARE  Per UR Regulation:  Reviewed for med. necessity/level of care/duration of stay  If discussed at Long Length of Stay Meetings, dates discussed:    Comments:  11/07/13 MMCGIBBONEY, RN, BSN CHART REVIEWED.

## 2013-11-21 NOTE — Discharge Summary (Signed)
Physician Discharge Summary  [facility] ZOX:[id number] DOB: [birth year] DOA: 11/06/2013  PCP: No PCP Per Patient  Admit date: 11/06/2013 Discharge date: 11/11/2013  LEFT AMA  Discharge Diagnoses:    L foot abscess, early wet gangrene   Osteomyelitis   HTN (hypertension), benign   Alcohol abuse   Osteomyelitis of left foot   COPD (chronic obstructive pulmonary disease)    Filed Weights   11/06/13 2011 11/07/13 1959  Weight: 79.878 kg (176 lb 1.6 oz) 79.425 kg (175 lb 1.6 oz)    History of present illness:  Chief Complaint: Worsening left foot pain with drainage  HPI: Ronald [name] is a 58 y.o. male with history significant for hypertension, alcohol abuse, gout in last discharged from the hospital on 1/13 following admission for cellulitis and abscess of left foot who presents with above complaints. Patient states that he completed the antibiotics prescribed on discharge, but it is noted that he did not follow up with Dr. Roda Shutters as he was directed. He states that over the past 3 weeks pain in the left foot has progressively worsened with increasing drainage as well. He denies fevers. He also reports that he has had some swelling and redness in his right foot. He was seen in the ED and plain films of showed findings worrisome for osteomyelitis involving the lateral aspect of the head of the third metatarsal. Orthopedics was consulted and Dr. August Saucer saw patient and recommended an MRI of the right foot as well as ABI. He is admitted for further evaluation and management.  He admits to drinking alcohol 2-3 times a week(about his 24 oz can and a 40 ounce bottle at a time and he drank today.)   Hospital Course:  left foot abscess, early wet gangrene and right lower leg cellulitis  - was treated with IV vancomycin and Zosyn  - Was seen by Ortho and Vascular - planned to have an Angiogram per [facility]VS on Monday, due to lack of peripheral pulses on dopplers and abnormal ABIs - it was felt that he may  need an amputation  - wound care was ongoing - patient was adamant to leave despite multiple attempts to discourage this and left AMA and said he will FU with his PCP and the surgeons as outpatient  HTN (hypertension), benign  - Continue outpatient medications   Alcohol abuse  - Placed on Ativan CIWA protocol, no overt withdrawal noted - Counseled to quit alcohol, but he has no interest in quitting at this time.  -continue thiamine, MVI   COPD (chronic obstructive pulmonary disease)  - Stable, counseled to quit tobacco   History of gout  Hyperglycemia  -hbaic 5.9    Consultations:  Ortho  VVS  Discharge Exam: Filed Vitals:   11/10/13 1800  BP: 152/84  Pulse: 90  Temp:   Resp:      Discharge Instructions   Future Appointments Provider Department Dept Phone   11/28/2013 2:00 PM Lenn Sink, DPM [facility] [phone]      No Known Allergies    The results of significant diagnostics from this hospitalization (including imaging, microbiology, ancillary and laboratory) are listed below for reference.    Significant Diagnostic Studies: Mr Foot Left W Wo Contrast  11/07/2013   CLINICAL DATA:  Foot infection.  Osteomyelitis.  EXAM: MRI OF THE LEFT FOREFOOT WITHOUT AND WITH CONTRAST  TECHNIQUE: Multiplanar, multisequence MR imaging was performed both before and after administration of intravenous contrast.  CONTRAST:  18mL MULTIHANCE GADOBENATE  DIMEGLUMINE 529 MG/ML IV SOLN  COMPARISON:  DG FOOT COMPLETE*L* dated 11/06/2013; MR FOOT*L* WO/W CM dated 09/20/2013  FINDINGS: In the ball of the foot plantar to the second and third MTP joints, there is an area of necrotic nonenhancing tissue that measures 8 mm plantar to dorsal and just over 2 cm transverse. In long axis of the foot, this measures about 13 millimeters. This appears to have a ulceration/sinus tract extending to the skin surface. Tiny area fluid is present centrally within this necrotic tissue,  which may represent a draining abscess or tiny recurrent abscess. Surrounding phlegmon is present extending up to the MTP joint capsules.  There is no convincing evidence of septic tenosynovitis or septic arthritis. Reactive bone marrow edema is present in the proximal phalanges and metatarsal heads of the second and third toes. The third metatarsal head and proximal phalanx show mild enhancement after contrast administration however there is no cortical erosion to indicate osteomyelitis and despite the edema, the fatty marrow is relatively preserved.  The study is mildly degraded by motion artifact and technical parameters were modified to accommodate.  Compared to the prior exam, the abscess appears to have been drained accounting for development of a sinus tract.  IMPRESSION: 1. Interval drainage of plantar forefoot abscess. Approximately 2 cm area of necrotic tissue in the area of previous seen abscess with surrounding phlegmon and a tiny amount of fluid centrally. Tiny amount of fluid centrally in necrotic tissue may represent a tiny recurrent abscess or draining abscess. 2. Reactive edema around the 2nd and 3rd MTP joints without osteomyelitis. 3. Forefoot cellulitis.   Electronically Signed   By: Andreas NewportGeoffrey  Lamke M.D.   On: 11/07/2013 08:15   Dg Foot Complete Left  11/06/2013   CLINICAL DATA:  Bilateral foot pain and swelling without known injury chronic wound between in the third and fourth toe  EXAM: LEFT FOOT - COMPLETE 3+ VIEW  COMPARISON:  None  FINDINGS: The bones of the foot are osteopenic. There are mild degenerative interphalangeal joint changes present. There is loss of sharp cortical margin of the lateral aspect of the head of the third metatarsal worrisome for osteomyelitis. The adjacent fourth digit is grossly normal. I cannot exclude minimal soft tissue gas lateral to the third metatarsophalangeal joint. More proximally the bones of the foot exhibit no lytic or blastic lesion. There is mild  soft tissue swelling over the forefoot.  IMPRESSION: The findings are worrisome for osteomyelitis involving the lateral aspect of the head of the third metatarsal. There is overlying soft tissue swelling consistent with cellulitis.   Electronically Signed   By: David  SwazilandJordan   On: 11/06/2013 16:04   Dg Foot Complete Right  11/06/2013   CLINICAL DATA:  Bilateral foot pain and swelling.  EXAM: RIGHT FOOT COMPLETE - 3+ VIEW  COMPARISON:  DG ANKLE COMPLETE*R* dated 04/15/2008; DG FOOT COMPLETE*R* dated 12/24/2003  FINDINGS: The bones of the right foot are osteopenic. There is a bony fragment along the ventral aspect of the distal half of the shaft of the first metatarsal. This is separate from the sesamoid bones. No donor site is demonstrated. There are mild degenerative changes of the interphalangeal joint. Deformity of the DIP joint of the third toe is demonstrated. No erosive changes of the right foot similar to those noted on the left are demonstrated. The bones of the hindfoot exhibit no acute abnormality. Mild soft tissue swelling diffusely is present.  IMPRESSION: 1. There are no erosive changes suspicious for  osteomyelitis. There is no soft tissue gas. 2. There is chronic deformity of the DIP joint of the third toe. There is mild degenerative change of the first metatarsophalangeal joint. 3. There is nonspecific bony density within the ventral aspect of the soft tissues adjacent to the shaft of the first metatarsal. It was present on a study from 2005.   Electronically Signed   By: David  [place]   On: 11/06/2013 16:10    Microbiology: No results found for this or any previous visit (from the past 240 hour(s)).   Labs: Basic Metabolic Panel: No results found for this basename: NA, K, CL, CO2, GLUCOSE, BUN, CREATININE, CALCIUM, MG, PHOS,  in the last 168 hours Liver Function Tests: No results found for this basename: AST, ALT, ALKPHOS, BILITOT, PROT, ALBUMIN,  in the last 168 hours No results found for  this basename: LIPASE, AMYLASE,  in the last 168 hours No results found for this basename: AMMONIA,  in the last 168 hours CBC: No results found for this basename: WBC, NEUTROABS, HGB, HCT, MCV, PLT,  in the last 168 hours Cardiac Enzymes: No results found for this basename: CKTOTAL, CKMB, CKMBINDEX, TROPONINI,  in the last 168 hours BNP: BNP (last 3 results) No results found for this basename: PROBNP,  in the last 8760 hours CBG: No results found for this basename: GLUCAP,  in the last 168 hours     Signed:  Alvenia Treese  [facility] 11/21/2013, 8:54 PM

## 2013-11-28 ENCOUNTER — Ambulatory Visit (INDEPENDENT_AMBULATORY_CARE_PROVIDER_SITE_OTHER): Payer: Medicaid Other

## 2013-11-28 ENCOUNTER — Encounter: Payer: Self-pay | Admitting: Podiatry

## 2013-11-28 ENCOUNTER — Ambulatory Visit (INDEPENDENT_AMBULATORY_CARE_PROVIDER_SITE_OTHER): Payer: Medicaid Other | Admitting: Podiatry

## 2013-11-28 VITALS — BP 173/103 | HR 96 | Resp 16 | Ht 62.0 in | Wt 180.0 lb

## 2013-11-28 DIAGNOSIS — M79609 Pain in unspecified limb: Secondary | ICD-10-CM

## 2013-11-28 DIAGNOSIS — M79673 Pain in unspecified foot: Secondary | ICD-10-CM

## 2013-11-28 DIAGNOSIS — L02619 Cutaneous abscess of unspecified foot: Secondary | ICD-10-CM

## 2013-11-28 DIAGNOSIS — L03119 Cellulitis of unspecified part of limb: Secondary | ICD-10-CM

## 2013-11-28 DIAGNOSIS — I739 Peripheral vascular disease, unspecified: Secondary | ICD-10-CM

## 2013-11-28 MED ORDER — SULFAMETHOXAZOLE-TMP DS 800-160 MG PO TABS: 1.0000 | ORAL_TABLET | Freq: Two times a day (BID) | ORAL | Status: DC

## 2013-11-28 MED ORDER — CIPROFLOXACIN HCL 500 MG PO TABS: 500.0000 mg | ORAL_TABLET | Freq: Two times a day (BID) | ORAL | Status: DC

## 2013-11-28 NOTE — Progress Notes (Signed)
   Subjective:    Patient ID: Ronald [name], male    DOB: [birth year], 58 y.o.   MRN: [id number]  HPI Comments: N pain/ swelling L left foot around 1st, 2nd, 3rd digits D January 2015 O slowly C better A ? T soaks in dial soap and epson salt, advil, aleve  Foot Pain      Review of Systems  Constitutional: Negative.   HENT: Negative.   Eyes: Positive for itching.  Respiratory: Negative.   Cardiovascular: Negative.   Gastrointestinal: Negative.   Endocrine: Negative.   Genitourinary: Negative.   Musculoskeletal:       Joint pain Difficulty walking  Skin:       Open sores  Allergic/Immunologic: Negative.   Neurological: Negative.   Hematological:       Slow to heal  Psychiatric/Behavioral: Negative.        Objective:   Physical Exam        Assessment & Plan:

## 2013-11-29 NOTE — Progress Notes (Signed)
Subjective:     Patient ID: Ronald [name], male   DOB: 04/17/1956, 58 y.o.   MRN: [id number]  Foot Pain   patient presents relatively dis oriented stating he left the hospital for his left foot and a didn't seem to do anything for him. He is not a good historian and has no caregiver with him   Review of Systems  All other systems reviewed and are negative.       Objective:   Physical Exam  Nursing note and vitals reviewed. Skin: Skin is dry.   patient has severe loss of circulatory status left lower leg with shiny appearance to the leg and very tight skin with minimal range of motion or muscle strength noted. There is distal breakdown of tissue but it is localized and I did not note any subcutaneous exposure with no increased erythema or edema noted currently. There was nothing to culture and it was strictly do to the circulatory status at this time     Assessment:     Patient to is not a good historian who presents with circulatory issues of the left lower extremity with distal symptoms secondary to circulatory status and also possible localized infection    Plan:     Explained to him the importance of vascular studies and he is in agreement and seems to be coherent to this point. He does not want to go back to the hospital at this time as he did not have a good experience and I am ordering stat vascular studies of his left lower extremity. I did place him on Septra and Cipro temporarily and I have ordered stat testing which should be done in the next day or 2. If he should develop any increase in lower extremity issues he is to go straight to the emergency room which he promises to do and I did tell him there is a very good chance that he is going to have amputation of his left lower leg do to circulatory issues

## 2013-12-13 ENCOUNTER — Other Ambulatory Visit: Payer: Self-pay | Admitting: *Deleted

## 2013-12-13 MED ORDER — CIPROFLOXACIN HCL 500 MG PO TABS: 500.0000 mg | ORAL_TABLET | Freq: Two times a day (BID) | ORAL | Status: DC

## 2013-12-17 ENCOUNTER — Telehealth: Payer: Self-pay | Admitting: *Deleted

## 2013-12-17 NOTE — Telephone Encounter (Signed)
He was supposed to call about setting up an appointment for vascular.  His foot has gotten worse.  I don't know if we missed the call for the appointment.  Checking on the status of that.  I returned her call and she stated she spoke to BolivarAshley on Thursday.  He has an appointment on tomorrow at 11:30am.

## 2013-12-18 ENCOUNTER — Other Ambulatory Visit: Payer: Self-pay | Admitting: Podiatry

## 2013-12-18 ENCOUNTER — Ambulatory Visit (HOSPITAL_COMMUNITY)
Admission: RE | Admit: 2013-12-18 | Discharge: 2013-12-18 | Disposition: A | Discharge status: Home / Self Care | Payer: Medicaid Other | Source: Ambulatory Visit | Attending: Vascular Surgery | Admitting: Vascular Surgery

## 2013-12-18 DIAGNOSIS — L97909 Non-pressure chronic ulcer of unspecified part of unspecified lower leg with unspecified severity: Secondary | ICD-10-CM

## 2013-12-18 DIAGNOSIS — L02619 Cutaneous abscess of unspecified foot: Secondary | ICD-10-CM | POA: Insufficient documentation

## 2013-12-18 DIAGNOSIS — I739 Peripheral vascular disease, unspecified: Secondary | ICD-10-CM

## 2013-12-18 DIAGNOSIS — L03119 Cellulitis of unspecified part of limb: Principal | ICD-10-CM

## 2013-12-18 DIAGNOSIS — L97509 Non-pressure chronic ulcer of other part of unspecified foot with unspecified severity: Secondary | ICD-10-CM | POA: Insufficient documentation

## 2013-12-19 ENCOUNTER — Telehealth: Payer: Self-pay | Admitting: *Deleted

## 2013-12-19 DIAGNOSIS — I739 Peripheral vascular disease, unspecified: Secondary | ICD-10-CM

## 2013-12-19 NOTE — Telephone Encounter (Signed)
I called him back.  Informed him that he is scheduled to see Dr. Nanetta BattyJonathan [name] on 12/25/13 at 10:15am [address]  I advised him to keep an eye on his foot if it gets worse, please go to the emergency room.  He stated it's fine right now, they told me I didn't have any clots or anything.  It's not hurting as bad as it was.

## 2013-12-19 NOTE — Telephone Encounter (Addendum)
I called to see if the patient went to the emergency room yesterday following his doppler study.  He stated this is the first he heard about it.  I informed him that his doppler was abnormal.  He needs follow up with a vascular doctor.  I told him we're going to try and get him an appointment with Dr. Allyson SabalBerry.  I'll call him back.

## 2013-12-25 ENCOUNTER — Ambulatory Visit (INDEPENDENT_AMBULATORY_CARE_PROVIDER_SITE_OTHER): Payer: Medicaid Other | Admitting: Cardiovascular Disease

## 2013-12-25 ENCOUNTER — Inpatient Hospital Stay (HOSPITAL_COMMUNITY): Payer: Medicaid Other

## 2013-12-25 ENCOUNTER — Encounter: Payer: Self-pay | Admitting: Cardiovascular Disease

## 2013-12-25 ENCOUNTER — Ambulatory Visit (HOSPITAL_BASED_OUTPATIENT_CLINIC_OR_DEPARTMENT_OTHER)
Admission: RE | Admit: 2013-12-25 | Discharge: 2013-12-25 | Disposition: A | Discharge status: Home / Self Care | Payer: Medicaid Other | Source: Ambulatory Visit | Attending: Cardiovascular Disease | Admitting: Cardiovascular Disease

## 2013-12-25 ENCOUNTER — Inpatient Hospital Stay (HOSPITAL_COMMUNITY)
Admission: AD | Admit: 2013-12-25 | Discharge: 2014-01-01 | DRG: 240 | Disposition: A | Discharge status: Skilled Nursing Facility | Payer: Medicaid Other | Source: Ambulatory Visit | Attending: Orthopedic Surgery | Admitting: Orthopedic Surgery

## 2013-12-25 VITALS — BP 158/96 | HR 72 | Ht 62.0 in | Wt 180.0 lb

## 2013-12-25 DIAGNOSIS — F17200 Nicotine dependence, unspecified, uncomplicated: Secondary | ICD-10-CM | POA: Diagnosis present

## 2013-12-25 DIAGNOSIS — I70229 Atherosclerosis of native arteries of extremities with rest pain, unspecified extremity: Secondary | ICD-10-CM

## 2013-12-25 DIAGNOSIS — F1010 Alcohol abuse, uncomplicated: Secondary | ICD-10-CM | POA: Diagnosis present

## 2013-12-25 DIAGNOSIS — I998 Other disorder of circulatory system: Secondary | ICD-10-CM

## 2013-12-25 DIAGNOSIS — I739 Peripheral vascular disease, unspecified: Principal | ICD-10-CM | POA: Diagnosis present

## 2013-12-25 DIAGNOSIS — M869 Osteomyelitis, unspecified: Secondary | ICD-10-CM | POA: Diagnosis present

## 2013-12-25 DIAGNOSIS — I96 Gangrene, not elsewhere classified: Secondary | ICD-10-CM | POA: Diagnosis present

## 2013-12-25 DIAGNOSIS — L98499 Non-pressure chronic ulcer of skin of other sites with unspecified severity: Secondary | ICD-10-CM

## 2013-12-25 DIAGNOSIS — M109 Gout, unspecified: Secondary | ICD-10-CM | POA: Diagnosis present

## 2013-12-25 DIAGNOSIS — I10 Essential (primary) hypertension: Secondary | ICD-10-CM | POA: Diagnosis present

## 2013-12-25 DIAGNOSIS — L97509 Non-pressure chronic ulcer of other part of unspecified foot with unspecified severity: Secondary | ICD-10-CM | POA: Diagnosis present

## 2013-12-25 DIAGNOSIS — I999 Unspecified disorder of circulatory system: Secondary | ICD-10-CM

## 2013-12-25 LAB — COMPREHENSIVE METABOLIC PANEL
ALK PHOS: 103 U/L (ref 39–117)
ALT: 22 U/L (ref 0–53)
AST: 21 U/L (ref 0–37)
Albumin: 4.3 g/dL (ref 3.5–5.2)
BUN: 11 mg/dL (ref 6–23)
CALCIUM: 9.8 mg/dL (ref 8.4–10.5)
CO2: 25 mEq/L (ref 19–32)
Chloride: 100 mEq/L (ref 96–112)
Creatinine, Ser: 0.65 mg/dL (ref 0.50–1.35)
GFR calc non Af Amer: >90 mL/min (ref >90)
GLUCOSE: 96 mg/dL (ref 70–99)
Potassium: 3.5 mEq/L — ABNORMAL LOW (ref 3.7–5.3)
Sodium: 143 mEq/L (ref 137–147)
Total Bilirubin: 0.3 mg/dL (ref 0.3–1.2)
Total Protein: 8.5 g/dL — ABNORMAL HIGH (ref 6.0–8.3)

## 2013-12-25 LAB — CBC
HEMATOCRIT: 39.3 % (ref 39.0–52.0)
HEMOGLOBIN: 13.8 g/dL (ref 13.0–17.0)
MCH: 32.5 pg (ref 26.0–34.0)
MCHC: 35.1 g/dL (ref 30.0–36.0)
MCV: 92.5 fL (ref 78.0–100.0)
Platelets: 375 10*3/uL (ref 150–400)
RBC: 4.25 MIL/uL (ref 4.22–5.81)
RDW: 15.2 % (ref 11.5–15.5)
WBC: 12.5 10*3/uL — ABNORMAL HIGH (ref 4.0–10.5)

## 2013-12-25 LAB — PROTIME-INR
INR: 0.96 (ref 0.00–1.49)
PROTHROMBIN TIME: 12.6 s (ref 11.6–15.2)

## 2013-12-25 MED ORDER — HEPARIN SODIUM (PORCINE) 5000 UNIT/ML IJ SOLN
5000.0000 [IU] | Freq: Three times a day (TID) | INTRAMUSCULAR | Status: DC
  Administered 2013-12-25 – 2013-12-31 (×17): 5000 [IU] via SUBCUTANEOUS
  Filled 2013-12-25 (×24): qty 1

## 2013-12-25 MED ORDER — THIAMINE HCL 100 MG/ML IJ SOLN
100.0000 mg | Freq: Every day | INTRAMUSCULAR | Status: DC
  Filled 2013-12-25 (×5): qty 1

## 2013-12-25 MED ORDER — MORPHINE SULFATE 2 MG/ML IJ SOLN
2.0000 mg | INTRAMUSCULAR | Status: DC | PRN
  Administered 2013-12-26 – 2013-12-28 (×2): 2 mg via INTRAVENOUS
  Filled 2013-12-25 (×2): qty 1

## 2013-12-25 MED ORDER — ACETAMINOPHEN 650 MG RE SUPP: 650.0000 mg | Freq: Four times a day (QID) | RECTAL | Status: DC | PRN

## 2013-12-25 MED ORDER — LORAZEPAM 1 MG PO TABS: 1.0000 mg | ORAL_TABLET | Freq: Four times a day (QID) | ORAL | Status: AC | PRN

## 2013-12-25 MED ORDER — ACETAMINOPHEN 325 MG PO TABS
650.0000 mg | ORAL_TABLET | Freq: Four times a day (QID) | ORAL | Status: DC | PRN
  Filled 2013-12-25: qty 2

## 2013-12-25 MED ORDER — LORAZEPAM 2 MG/ML IJ SOLN: 1.0000 mg | Freq: Four times a day (QID) | INTRAMUSCULAR | Status: AC | PRN

## 2013-12-25 MED ORDER — FOLIC ACID 1 MG PO TABS
1.0000 mg | ORAL_TABLET | Freq: Every day | ORAL | Status: DC
  Administered 2013-12-25 – 2014-01-01 (×6): 1 mg via ORAL
  Filled 2013-12-25 (×8): qty 1

## 2013-12-25 MED ORDER — VITAMIN B-1 100 MG PO TABS
100.0000 mg | ORAL_TABLET | Freq: Every day | ORAL | Status: DC
  Administered 2013-12-25 – 2014-01-01 (×6): 100 mg via ORAL
  Filled 2013-12-25 (×8): qty 1

## 2013-12-25 MED ORDER — ADULT MULTIVITAMIN W/MINERALS CH
1.0000 | ORAL_TABLET | Freq: Every day | ORAL | Status: DC
  Administered 2013-12-25 – 2014-01-01 (×6): 1 via ORAL
  Filled 2013-12-25 (×8): qty 1

## 2013-12-25 MED ORDER — OXYCODONE-ACETAMINOPHEN 5-325 MG PO TABS
1.0000 | ORAL_TABLET | Freq: Four times a day (QID) | ORAL | Status: DC | PRN
  Administered 2013-12-25 – 2014-01-01 (×18): 2 via ORAL
  Filled 2013-12-25 (×17): qty 2

## 2013-12-25 NOTE — Progress Notes (Signed)
Left Lower Extremity Arterial Duplex Completed. °Brianna L Mazza,RVT °

## 2013-12-25 NOTE — Consult Note (Signed)
ORTHOPAEDIC CONSULTATION  REQUESTING PHYSICIAN: Runell GessJonathan J Berry, MD  Chief Complaint: left foot infection  HPI: Ronald [name] is a 58 y.o. male who complains of left foot infection and pain.  He is known to me as I have taken care of his left foot abscess and this resolved.  He subsequently returned with increasing pain with the foot but a MRI showed that he had no abscess or osteo.  He left the hospital AMA before angiography could be performed.  He currently presents with signs of worsening infection and malodor of the left foot.  Denies any constitutional sxs.  Currently still drinks alcohol heavily and smokes regularly.    Past Medical History  Diagnosis Date  . Gout   . Hypertension   . ETOH abuse   . Critical lower limb ischemia    Past Surgical History  Procedure Laterality Date  . Leg surgery     History   Social History  . Marital Status: Single    Spouse Name: N/A    Number of Children: N/A  . Years of Education: N/A   Social History Main Topics  . Smoking status: Current Every Day Smoker -- 0.50 packs/day for 40 years    Types: Cigarettes  . Smokeless tobacco: Never Used  . Alcohol Use: 72.0 oz/week    120 Cans of beer per week     Comment: "A whole Bunch - I am a drunk"  . Drug Use: Yes    Special: Cocaine  . Sexual Activity: Not on file   Other Topics Concern  . Not on file   Social History Narrative  . No narrative on file   No family history on file. No Known Allergies Prior to Admission medications   Medication Sig Start Date End Date Taking? Authorizing Provider  ciprofloxacin (CIPRO) 500 MG tablet Take 1 tablet (500 mg total) by mouth 2 (two) times daily. 12/13/13   Lenn SinkNorman S Regal, DPM  sulfamethoxazole-trimethoprim (BACTRIM DS) 800-160 MG per tablet Take 1 tablet by mouth 2 (two) times daily. 11/28/13   Lenn SinkNorman S Regal, DPM  triamterene-hydrochlorothiazide (MAXZIDE-25) 37.5-25 MG per tablet Take 1 tablet by mouth daily. 09/26/13   Rodolph Bonganiel V  Thompson, MD   Dg Foot Complete Left  12/25/2013   CLINICAL DATA:  Infection.  Evaluate for osteomyelitis.  EXAM: LEFT FOOT - COMPLETE 3+ VIEW  COMPARISON:  11/28/2013  FINDINGS: There is a soft tissue ulcer on the plantar margin of the forefoot below the metatarsophalangeal joints. There is lucency of the second, third and fourth metatarsal heads and the medial base of the proximal phalanx of the fourth toe. This is most prominent involving the fourth metatarsal head where there is loss of the white cortical line. Surrounding forefoot soft tissue swelling is seen.  There is degenerative spurring and along the dorsal midfoot. There is mild asymmetric joint space narrowing of the first metatarsophalangeal joint. No other arthropathic change.  Bones are demineralized.  IMPRESSION: 1. Findings consistent with osteomyelitis the head of the fourth metatarsal and the medial base of the proximal phalanx of the fourth toe. There is possible additional osteomyelitis in the heads of the third and second metatarsals. There is a plantar ulcer along the plantar aspect of the forefoot below the second and third metatarsophalangeal joints.   Electronically Signed   By: Amie Portlandavid  Ormond M.D.   On: 12/25/2013 16:20    Positive ROS: All other systems have been reviewed and were otherwise negative with the exception of  those mentioned in the HPI and as above.  Physical Exam: General: Alert, no acute distress Cardiovascular: No pedal edema Respiratory: No cyanosis, no use of accessory musculature GI: No organomegaly, abdomen is soft and non-tender Skin: No lesions in the area of chief complaint Neurologic: Sensation intact distally Psychiatric: Patient is competent for consent with normal mood and affect Lymphatic: No axillary or cervical lymphadenopathy  MUSCULOSKELETAL: - shiny skin without hair from about the distal 1/3 of the lower leg down  - stage 4 wagner ulcers of foot - malodor from wounds - open wound on  the plantar surface that probes down to bone - no fluctuance or frank pus, induration from plantar foot - superficial infection with tissue loss on dorsum - nonpalpable pulses  Assessment: Left foot wagner stage 4 ulcers  Plan: - discussed treatment options with patient including evaluation for revasc vs BKA.  Patient will think about it.  My recommendation is to undergo BKA. - in my opinion, patient will be better served with a BKA as patient has demonstrated noncompliance and with h/o smoking and ETOH abuse, even if patient is a revasc candidate, patient is unlikely to have long term benefits from this procedure.  Patient will still be left with the gangrene and significant tissue loss which will require wound care.  BKA will allow the patient to start rehabing much sooner and does not require any wound care and will be definitive.   - I will check back with the patient in the morning regarding his decision  Thank you for the consult and the opportunity to see Ronald [name]  N. Glee ArvinMichael Xu, MD [facility] [phone] 7:06 PM

## 2013-12-25 NOTE — Assessment & Plan Note (Signed)
The patient was referred to me by Dr. Orlene OchNorm Regal at [facility] for evaluation of an infected left foot/critical limb ischemia. He has had surgery performed by Dr. Glee ArvinMichael Xu for I&D of left foot abscess 09/21/13. He returned a month later for recurrence of symptoms and intravenous antibiotics. An MRI did not show osteomyelitis. Dr. Teodoro KilWelles Brabham , was consulted who felt that the patient needed angiography and potential intervention for limb salvage over this never occurred.recent lower extremity arterial Doppler studies performed 12/18/13 at [facility] revealed patent iliac and SFA with monophasic waveforms in his tibial arteries. I believe he will need admission for systemic antibiotics, wound care and ultimately angiography and potential revascularization.

## 2013-12-25 NOTE — Progress Notes (Signed)
12/25/2013 Ronald [name]   [birth year]  [id number]  Primary Physician Ronald SaucerEAN, ERIC, MD Primary Cardiologist:       HPI:  Mr. Ronald [name] is a 58 year old moderately overweight single African American male with no children who has been disabled since [id number]. He was referred by Ronald [name] because of an infected left [name] and critical limb ischemia. Ronald [name] past medical history is remarkable for 40-pack-years of tobacco abuse currently smoking one pack per day. He drinks 6 or more cans of beer a day and has apparently had alcohol withdrawal in the past. He does not use illicit drugs. He does have hypertension as well. He had an infected left [name] since January of this year and underwent I&D of this by an orthopedic surgeon. He returned a month later for recurrent symptoms. He's had systemic antibiotics an MRI that did not show us in our lettuce. While in the hospital in February Dr. Teodoro KilWelles [name] was consulted from vascular surgery who felt the patient needed angiography and potential revascularization for limb salvage. This never occurred.   Current Outpatient Prescriptions  Medication Sig Dispense Refill  . ciprofloxacin (CIPRO) 500 MG tablet Take 1 tablet (500 mg total) by mouth 2 (two) times daily.  20 tablet  0  . sulfamethoxazole-trimethoprim (BACTRIM DS) 800-160 MG per tablet Take 1 tablet by mouth 2 (two) times daily.  20 tablet  1  . triamterene-hydrochlorothiazide (MAXZIDE-25) 37.5-25 MG per tablet Take 1 tablet by mouth daily.  31 tablet  0   No current facility-administered medications for this visit.    No Known Allergies  History   Social History  . Marital Status: Single    Spouse Name: N/A    Number of Children: N/A  . Years of Education: N/A   Occupational History  . Not on file.   Social History Main Topics  . Smoking status: Current Every Day Smoker -- 0.50 packs/day for 40 years    Types: Cigarettes    . Smokeless tobacco: Never Used  . Alcohol Use: 72.0 oz/week    120 Cans of beer per week     Comment: "A whole Bunch - I am a drunk"  . Drug Use: Yes    Special: Cocaine  . Sexual Activity: Not on file   Other Topics Concern  . Not on file   Social History Narrative  . No narrative on file     Review of Systems: General: negative for chills, fever, night sweats or weight changes.  Cardiovascular: negative for chest pain, dyspnea on exertion, edema, orthopnea, palpitations, paroxysmal nocturnal dyspnea or shortness of breath Dermatological: negative for rash Respiratory: negative for cough or wheezing Urologic: negative for hematuria Abdominal: negative for nausea, vomiting, diarrhea, bright red blood per rectum, melena, or hematemesis Neurologic: negative for visual changes, syncope, or dizziness All other systems reviewed and are otherwise negative except as noted above.    Blood pressure 158/96, pulse 72, height 5\' 2"  (1.575 m), weight 180 lb (81.647 kg).  General appearance: alert and no distress Neck: no adenopathy, no carotid bruit, no JVD, supple, symmetrical, trachea midline and thyroid not enlarged, symmetric, no tenderness/mass/nodules Lungs: clear to auscultation bilaterally Heart: regular rate and rhythm, S1, S2 normal, no murmur, click, rub or gallop Extremities: infected, gangrenous and swollen left [name]  EKG not performed today  ASSESSMENT AND PLAN:   Critical lower limb ischemia The patient was referred to me by Ronald [name]  at [name] for [name] of an infected left [name]/critical limb ischemia. He has had surgery performed by Ronald [name] 09/21/13. He returned a month later for recurrence of symptoms and intravenous antibiotics. An MRI did not show osteomyelitis. Dr. Teodoro KilWelles [name] , was consulted who felt that the patient needed angiography and potential intervention for limb salvage over this never occurred.recent lower  extremity arterial Doppler studies [facility] revealed patent iliac and SFA with monophasic waveforms in his tibial arteries. I believe he will need admission for systemic antibiotics, wound care and ultimately angiography and potential revascularization.      Runell GessJonathan J. Berry MD FACP,FACC,FAHA, [facility] 12/25/2013 11:18 AM

## 2013-12-25 NOTE — Consult Note (Signed)
[facility] [place] CONSULT [place]   MRN : [id number]  Reason for Consult: Ischemic left lower extremity Referring Physician: Dr. Allyson SabalBerry  History of Present Illness: 58 y/o male with chief complaint of left lower extremity pain.  He was last seen by Dr. Myra GianottiBrabham on 11/06/2013.  His plan at the time was the need for angiography to better define his vascular anatomy.  He was unable to tolerate ABI's due to pain on his previous visit.  He will likely require re-vascularization in attempt to salvage his foot.  The patient states his left foot feels better when it is in the dependent position.   Past medical history includes hypertension which is medically managed, gout and alcohol abuse. He is a current smoker. He denise hyperlipidemia and DM. Musculoskeletal surgery history bilateral ankle fusions per patient reason unknown.        Current Facility-Administered Medications  Medication Dose Route Frequency Provider Last Rate Last Dose  . acetaminophen (TYLENOL) tablet 650 mg  650 mg Oral Q6H PRN Brittainy Simmons, PA-C       Or  . acetaminophen (TYLENOL) suppository 650 mg  650 mg Rectal Q6H PRN Brittainy Simmons, PA-C      . heparin injection 5,000 Units  5,000 Units Subcutaneous 3 times per day Brittainy Simmons, PA-C        Pt meds include: Statin :No Betablocker: No ASA: No Other anticoagulants/antiplatelets:   Past Medical History  Diagnosis Date  . Gout   . Hypertension   . ETOH abuse   . Critical lower limb ischemia     Past Surgical History  Procedure Laterality Date  . Leg surgery      Social History History  Substance Use Topics  . Smoking status: Current Every Day Smoker -- 0.50 packs/day for 40 years    Types: Cigarettes  . Smokeless tobacco: Never Used  . Alcohol Use: 72.0 oz/week    120 Cans of beer per week     Comment: "A whole Bunch - I am a drunk"   He has a significant drinking history and has drank a bottle of wine prior to  admission today.  Family History Father HTN Mother DM, HTN  No Known Allergies   REVIEW OF SYSTEMS  General: [ ]  Weight loss, [ ]  Fever, [ ]  chills Neurologic: [ ]  Dizziness, [ ]  Blackouts, [ ]  Seizure [ ]  Stroke, [ ]  "Mini stroke", [ ]  Slurred speech, [ ]  Temporary blindness; [ ]  weakness in arms or legs, [ ]  Hoarseness [ ]  Dysphagia Cardiac: [ ]  Chest pain/pressure, [ ]  Shortness of breath at rest [ ]  Shortness of breath with exertion, [ ]  Atrial fibrillation or irregular heartbeat  Vascular: [x ] Pain in legs with walking, [x ] Pain in legs at rest, [x ] Pain in legs at night,  [x ] Non-healing ulcer, [ ]  Blood clot in vein/DVT,   Pulmonary: [ ]  Home oxygen, [ ]  Productive cough, [ ]  Coughing up blood, [ ]  Asthma,  [ ]  Wheezing [ ]  COPD Musculoskeletal:  [ ]  Arthritis, [ ]  Low back pain, [ ]  Joint pain Hematologic: [ ]  Easy Bruising, [ ]  Anemia; [ ]  Hepatitis Gastrointestinal: [ ]  Blood in stool, [ ]  Gastroesophageal Reflux/heartburn, Urinary: [ ]  chronic Kidney disease, [ ]  on HD - [ ]  MWF or [ ]  TTHS, [ ]  Burning with urination, [ ]  Difficulty urinating Skin: [ ]  Rashes, [x ] Wounds Psychological: [ ]  Anxiety, [ ]  Depression  Physical Examination  General:  WDWN in NAD HENT: WNL Eyes: Pupils equal Pulmonary: normal non-labored breathing , without Rales, rhonchi,  wheezing Cardiac: RRR, without  Murmurs, rubs or gallops; No carotid bruits Abdomen: soft, NT, no masses Skin: no rashes, ulcers noted;  no Gangrene , no cellulitis; Positive open wounds plantar metatarsal head third digit, superficial wounds on dorsum of left foot and dry gangrene of third digit.  Positive edema foot and ankle.  Vascular Exam/Pulses:Palpable bilateral femoral pulses, DP/PT palpable right.  No palpable left foot pulses.     Musculoskeletal: no muscle wasting or atrophy; no edema  Neurologic: A&O X 3; Appropriate Affect ;  SENSATION: normal; MOTOR FUNCTION: 5/5 Symmetric Speech is  fluent/normal   Significant Diagnostic Studies: CBC Lab Results  Component Value Date   WBC 13.5* 11/07/2013   HGB 12.4* 11/07/2013   HCT 36.7* 11/07/2013   MCV 93.9 11/07/2013   PLT 372 11/07/2013    BMET    Component Value Date/Time   NA 140 11/07/2013 0400   K 4.0 11/07/2013 0400   CL 101 11/07/2013 0400   CO2 26 11/07/2013 0400   GLUCOSE 110* 11/07/2013 0400   BUN 16 11/07/2013 0400   CREATININE 1.10 11/07/2013 0400   CALCIUM 9.0 11/07/2013 0400   GFRNONAA 73* 11/07/2013 0400   GFRAA 85* 11/07/2013 0400   The CrCl is unknown because both a height and weight (above a minimum accepted value) are required for this calculation.  COAG No results found for this basename: INR, PROTIME     Non-Invasive Vascular Imaging:  ABI's unable to tolerate  ASSESSMENT/PLAN:  Chronic left LE PAD   Plan angiogram to decide by-pass possibility verses imputation.  I will discuss this with Dr. Brabham.  He is on Cipro BID     Emma M Collins 12/25/2013 2:24 PM  I agree with the above.  I have seen and examined the patient.  He has ulceration and incection on the dorsum of his left foot extending down to the 3rd toe.  Duplex ultrasound on 4/7 reveals normal waveforms down to the popliteal artery with monophasic tibial waveforms, suggesting severe tibial disease.  I have discussed proceeding with angiography in the morning with the patient to evaluate his circulation and to see what options he has for revascularization.  I will proceed with revascularization as indicated at the time of angiography.  Even with successful revascularization, he is at risk for BKA given the extent of infection / ulceration.  Regardless, he will need debridement / amputation of his forefoot.  This will likely require a significant amount of wound care.  He ahs been non-compliant in the past, so this may be an issue.  Wells Brabham 

## 2013-12-26 ENCOUNTER — Encounter (HOSPITAL_COMMUNITY)
Admission: AD | Disposition: A | Discharge status: Skilled Nursing Facility | Payer: Self-pay | Source: Ambulatory Visit | Attending: Orthopaedic Surgery

## 2013-12-26 DIAGNOSIS — I999 Unspecified disorder of circulatory system: Secondary | ICD-10-CM

## 2013-12-26 HISTORY — PX: ABDOMINAL AORTAGRAM: SHX5454

## 2013-12-26 LAB — CBC
HCT: 39.2 % (ref 39.0–52.0)
Hemoglobin: 13.6 g/dL (ref 13.0–17.0)
MCH: 32.8 pg (ref 26.0–34.0)
MCHC: 34.7 g/dL (ref 30.0–36.0)
MCV: 94.5 fL (ref 78.0–100.0)
PLATELETS: 363 10*3/uL (ref 150–400)
RBC: 4.15 MIL/uL — ABNORMAL LOW (ref 4.22–5.81)
RDW: 15.6 % — AB (ref 11.5–15.5)
WBC: 11.2 10*3/uL — ABNORMAL HIGH (ref 4.0–10.5)

## 2013-12-26 LAB — POCT ACTIVATED CLOTTING TIME
ACTIVATED CLOTTING TIME: 204 s
ACTIVATED CLOTTING TIME: 204 s
Activated Clotting Time: 166 seconds

## 2013-12-26 LAB — BASIC METABOLIC PANEL
BUN: 15 mg/dL (ref 6–23)
CALCIUM: 9.5 mg/dL (ref 8.4–10.5)
CO2: 26 meq/L (ref 19–32)
Chloride: 101 mEq/L (ref 96–112)
Creatinine, Ser: 0.75 mg/dL (ref 0.50–1.35)
GFR calc non Af Amer: >90 mL/min (ref >90)
Glucose, Bld: 110 mg/dL — ABNORMAL HIGH (ref 70–99)
Potassium: 4 mEq/L (ref 3.7–5.3)
Sodium: 142 mEq/L (ref 137–147)

## 2013-12-26 SURGERY — ABDOMINAL AORTAGRAM
Anesthesia: LOCAL

## 2013-12-26 MED ORDER — HEPARIN (PORCINE) IN NACL 2-0.9 UNIT/ML-% IJ SOLN
INTRAMUSCULAR | Status: AC
  Filled 2013-12-26: qty 1000

## 2013-12-26 MED ORDER — ALUM & MAG HYDROXIDE-SIMETH 200-200-20 MG/5ML PO SUSP: 15.0000 mL | ORAL | Status: DC | PRN

## 2013-12-26 MED ORDER — HYDRALAZINE HCL 20 MG/ML IJ SOLN
10.0000 mg | INTRAMUSCULAR | Status: DC | PRN
  Administered 2013-12-26 – 2013-12-29 (×2): 10 mg via INTRAVENOUS
  Filled 2013-12-26: qty 1

## 2013-12-26 MED ORDER — ONDANSETRON HCL 4 MG/2ML IJ SOLN: 4.0000 mg | Freq: Four times a day (QID) | INTRAMUSCULAR | Status: DC | PRN

## 2013-12-26 MED ORDER — METOPROLOL TARTRATE 1 MG/ML IV SOLN: 2.0000 mg | INTRAVENOUS | Status: DC | PRN

## 2013-12-26 MED ORDER — HEPARIN SODIUM (PORCINE) 1000 UNIT/ML IJ SOLN
INTRAMUSCULAR | Status: AC
  Filled 2013-12-26: qty 1

## 2013-12-26 MED ORDER — ACETAMINOPHEN 650 MG RE SUPP
325.0000 mg | RECTAL | Status: DC | PRN
Start: 2013-12-26 — End: 2014-01-01

## 2013-12-26 MED ORDER — HYDRALAZINE HCL 20 MG/ML IJ SOLN
INTRAMUSCULAR | Status: AC
  Filled 2013-12-26: qty 1

## 2013-12-26 MED ORDER — PHENOL 1.4 % MT LIQD
1.0000 | OROMUCOSAL | Status: DC | PRN
  Filled 2013-12-26: qty 177

## 2013-12-26 MED ORDER — IBUPROFEN 400 MG PO TABS
400.0000 mg | ORAL_TABLET | Freq: Four times a day (QID) | ORAL | Status: DC | PRN
  Filled 2013-12-26: qty 1

## 2013-12-26 MED ORDER — MIDAZOLAM HCL 2 MG/2ML IJ SOLN
INTRAMUSCULAR | Status: AC
  Filled 2013-12-26: qty 2

## 2013-12-26 MED ORDER — LABETALOL HCL 5 MG/ML IV SOLN
10.0000 mg | INTRAVENOUS | Status: DC | PRN
  Administered 2013-12-28: 10 mg via INTRAVENOUS

## 2013-12-26 MED ORDER — ACETAMINOPHEN 325 MG PO TABS
325.0000 mg | ORAL_TABLET | ORAL | Status: DC | PRN
  Administered 2013-12-29: 650 mg via ORAL

## 2013-12-26 MED ORDER — GUAIFENESIN-DM 100-10 MG/5ML PO SYRP
15.0000 mL | ORAL_SOLUTION | ORAL | Status: DC | PRN
  Filled 2013-12-26: qty 15

## 2013-12-26 MED ORDER — LIDOCAINE HCL (PF) 1 % IJ SOLN
INTRAMUSCULAR | Status: AC
  Filled 2013-12-26: qty 30

## 2013-12-26 MED ORDER — FENTANYL CITRATE 0.05 MG/ML IJ SOLN
INTRAMUSCULAR | Status: AC
  Filled 2013-12-26: qty 2

## 2013-12-26 MED ORDER — SODIUM CHLORIDE 0.9 % IV SOLN: INTRAVENOUS | Status: DC

## 2013-12-26 SURGICAL SUPPLY — 55 items
ADH SKN CLS APL DERMABOND .7 (GAUZE/BANDAGES/DRESSINGS) ×2
BANDAGE ELASTIC 4 VELCRO ST LF (GAUZE/BANDAGES/DRESSINGS) IMPLANT
BANDAGE ESMARK 6X9 LF (GAUZE/BANDAGES/DRESSINGS) IMPLANT
BNDG CMPR 9X6 STRL LF SNTH (GAUZE/BANDAGES/DRESSINGS)
BNDG ESMARK 6X9 LF (GAUZE/BANDAGES/DRESSINGS)
CANISTER SUCTION 2500CC (MISCELLANEOUS) ×3 IMPLANT
CLIP TI MEDIUM 24 (CLIP) ×3 IMPLANT
CLIP TI WIDE RED SMALL 24 (CLIP) ×3 IMPLANT
COVER SURGICAL LIGHT HANDLE (MISCELLANEOUS) ×3 IMPLANT
CUFF TOURNIQUET SINGLE 24IN (TOURNIQUET CUFF) IMPLANT
CUFF TOURNIQUET SINGLE 34IN LL (TOURNIQUET CUFF) IMPLANT
CUFF TOURNIQUET SINGLE 44IN (TOURNIQUET CUFF) IMPLANT
DERMABOND ADVANCED (GAUZE/BANDAGES/DRESSINGS) ×1
DERMABOND ADVANCED .7 DNX12 (GAUZE/BANDAGES/DRESSINGS) ×2 IMPLANT
DRAIN CHANNEL 15F RND FF W/TCR (WOUND CARE) IMPLANT
DRAPE WARM FLUID 44X44 (DRAPE) ×3 IMPLANT
DRAPE X-RAY CASS 24X20 (DRAPES) IMPLANT
DRSG COVADERM 4X10 (GAUZE/BANDAGES/DRESSINGS) IMPLANT
DRSG COVADERM 4X8 (GAUZE/BANDAGES/DRESSINGS) IMPLANT
ELECT REM PT RETURN 9FT ADLT (ELECTROSURGICAL) ×3
ELECTRODE REM PT RTRN 9FT ADLT (ELECTROSURGICAL) ×2 IMPLANT
EVACUATOR SILICONE 100CC (DRAIN) IMPLANT
GLOVE BIOGEL PI IND STRL 7.5 (GLOVE) ×2 IMPLANT
GLOVE BIOGEL PI INDICATOR 7.5 (GLOVE) ×1
GLOVE SURG SS PI 7.5 STRL IVOR (GLOVE) ×3 IMPLANT
GOWN PREVENTION PLUS XXLARGE (GOWN DISPOSABLE) ×3 IMPLANT
GOWN STRL NON-REIN LRG LVL3 (GOWN DISPOSABLE) ×9 IMPLANT
HEMOSTAT SNOW SURGICEL 2X4 (HEMOSTASIS) IMPLANT
KIT BASIN OR (CUSTOM PROCEDURE TRAY) ×3 IMPLANT
KIT ROOM TURNOVER OR (KITS) ×3 IMPLANT
MARKER GRAFT CORONARY BYPASS (MISCELLANEOUS) IMPLANT
NS IRRIG 1000ML POUR BTL (IV SOLUTION) ×6 IMPLANT
PACK PERIPHERAL VASCULAR (CUSTOM PROCEDURE TRAY) ×3 IMPLANT
PAD ARMBOARD 7.5X6 YLW CONV (MISCELLANEOUS) ×6 IMPLANT
PADDING CAST COTTON 6X4 STRL (CAST SUPPLIES) IMPLANT
SET COLLECT BLD 21X3/4 12 (NEEDLE) IMPLANT
STOPCOCK 4 WAY LG BORE MALE ST (IV SETS) IMPLANT
SUT ETHILON 3 0 PS 1 (SUTURE) IMPLANT
SUT PROLENE 5 0 C 1 24 (SUTURE) ×3 IMPLANT
SUT PROLENE 6 0 BV (SUTURE) ×3 IMPLANT
SUT PROLENE 7 0 BV 1 (SUTURE) IMPLANT
SUT SILK 2 0 SH (SUTURE) ×3 IMPLANT
SUT SILK 3 0 (SUTURE)
SUT SILK 3-0 18XBRD TIE 12 (SUTURE) IMPLANT
SUT VIC AB 2-0 CT1 27 (SUTURE) ×6
SUT VIC AB 2-0 CT1 TAPERPNT 27 (SUTURE) ×4 IMPLANT
SUT VIC AB 3-0 SH 27 (SUTURE) ×4
SUT VIC AB 3-0 SH 27X BRD (SUTURE) ×4 IMPLANT
SUT VICRYL 4-0 PS2 18IN ABS (SUTURE) ×6 IMPLANT
TOWEL OR 17X24 6PK STRL BLUE (TOWEL DISPOSABLE) ×6 IMPLANT
TOWEL OR 17X26 10 PK STRL BLUE (TOWEL DISPOSABLE) ×6 IMPLANT
TRAY FOLEY CATH 16FRSI W/METER (SET/KITS/TRAYS/PACK) ×3 IMPLANT
TUBING EXTENTION W/L.L. (IV SETS) IMPLANT
UNDERPAD 30X30 INCONTINENT (UNDERPADS AND DIAPERS) ×3 IMPLANT
WATER STERILE IRR 1000ML POUR (IV SOLUTION) ×3 IMPLANT

## 2013-12-26 NOTE — Op Note (Signed)
Patient name: Ronald [name] MRN: [id number] DOB: 06/18/1956 Sex: male  12/25/2013 - 12/26/2013 Pre-operative Diagnosis: left leg wound  Post-operative diagnosis:  Same Surgeon:  Nada LibmanVance W Kaylan Friedmann Procedure Performed:  1.  ultrasound-guided access, right femoral artery  2.  abdominal aortogram  3.  left lower extremity runoff  4.  additional order catheterization (left anterior tibial artery)  5.  failed angioplasty, left anterior tibial artery    Indications:  The patient has a left foot ulcer that has been present since the beginning of the year.  This has progressed.  I saw him approximately one month ago and recommended angiography, as he had an ultrasound which suggested tibial disease.  The patient did not show up for his angiography.  He was recently admitted for infection in his left foot.  He is at extremely high risk for below knee amputation.  I discussed attempting angiography to see if we can improve his blood flow.  Even with improvement in his blood flow, he is at high risk for below knee amputation.  He was given the option of proceeding with amputation vs. an attempt at limb salvage.  He wished to proceed with an attempt at limb salvage.  Procedure:  The patient was identified in the holding area and taken to room 8.  The patient was then placed supine on the table and prepped and draped in the usual sterile fashion.  A time out was called.  Ultrasound was used to evaluate the right common femoral artery.  It was patent .  A digital ultrasound image was acquired.  A micropuncture needle was used to access the right common femoral artery under ultrasound guidance.  An 018 wire was advanced without resistance and a micropuncture sheath was placed.  The 018 wire was removed and a benson wire was placed.  The micropuncture sheath was exchanged for a 5 french sheath.  An omniflush catheter was advanced over the wire to the level of L-1.  An abdominal angiogram was obtained.  Next, using  the omniflush catheter and a benson wire, the aortic bifurcation was crossed and the catheter was placed into theleft external iliac artery and left runoff was obtained.    Findings:   Aortogram:  No significant renal artery stenosis is identified.  The infrarenal abdominal aorta is widely patent.  No significant suprarenal aortic disease is identified.  Bilateral common and external iliac arteries are widely patent.  Left Lower Extremity:  The left common femoral, profundofemoral, and superficial femoral arteries are widely patent.  The popliteal artery is widely patent.  The patient has occlusion of all 3 tibial vessels near their origin.  Via peroneal collaterals, there is reconstitution of a dorsalis pedis.   Intervention:  After the above images were acquired, the decision was made to attempt recanalization of his occluded anterior tibial artery.  Over a 035 wire, a 6 [place] Ansel 1 sheath was advanced over the bifurcation into the left superficial femoral artery.  I selected a 014 cougar wire and a 014  2.5 x 40 Abbott balloon for support.  I was able to get into the origin of the anterior tibial artery.  I tried to attempt subintimal recanalization, however I could not get the wire to go into the appropriate past.  I tried a 18 g tip wire and cougar wire and was unsuccessful.  Because of the long distance required for recanalization I did not feel that this was going to be successful.  After an episode  of perforation, I elected to terminate the procedure.  Catheters and wires were removed.  The patient taken the holding area for sheath pull once his coagulation profile corrects, as he was given systemic heparinization for the intervention portion of the procedure.  The long sheath was withdrawn to the right external iliac artery.  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  There is delayed reconstitution of the dorsalis pedis artery.  #2   failed attempt at anterior tibial recanalization, left  #3  the patient is not a candidate for surgical revascularization given the location of the wound on his foot, and the distal target vessel    V. Durene CalWells Shaena Parkerson, M.D. [facility] [place] Office: [phone] Pager:  [phone]

## 2013-12-26 NOTE — H&P (Signed)
H&P update  The surgical history has been reviewed and remains accurate without interval change.  The patient was re-examined and patient's physiologic condition has not changed significantly in the last 30 days. The condition still exists that makes this procedure necessary. The treatment plan remains the same, without new options for care.  No new pharmacological allergies or types of therapy has been initiated that would change the plan or the appropriateness of the plan.  The patient and/or family understand the potential benefits and risks.  Mayra ReelN. Michael Brighton Delio, MD 12/26/2013 2:00 PM

## 2013-12-26 NOTE — Progress Notes (Signed)
Spoke with patient over phone regarding him not being a candidate for revasc.  Patient refuses to undergo BKA today.  Wants to do this on Friday.  I told him that I am out of town Friday and that my partner Dr. Lajoyce Cornersuda would be doing this.  He understands and wishes to have surgery Friday.   Mayra ReelN. [name], MD [facility] [phone] 2:02 PM

## 2013-12-26 NOTE — Progress Notes (Signed)
   Subjective:  Patient has decided to go ahead with angiogram first today.  Objective:   VITALS:   Filed Vitals:   12/25/13 1830 12/25/13 2000 12/26/13 0015 12/26/13 0500  BP: 149/92 136/81 131/85 143/83  Pulse: 87 84 83 18  Temp: 99 F (37.2 C) 98.8 F (37.1 C) 98.2 F (36.8 C) 98.1 F (36.7 C)  TempSrc: Oral Oral    Resp: 18 16 16  76  Height: 5\' 2"  (1.575 m)     Weight: 81.647 kg (180 lb)     SpO2: 99% 100% 100% 100%    exam unchanged   Lab Results  Component Value Date   WBC 11.2* 12/26/2013   HGB 13.6 12/26/2013   HCT 39.2 12/26/2013   MCV 94.5 12/26/2013   PLT 363 12/26/2013     Assessment/Plan:  - angiogram scheduled for 9 am today - if patient is not a revasc candidate, then patient may still undergo BKA later today - please keep NPO - again discussed my recommendations with patient  Problem List Items Addressed This Visit   None       [place]iping Glee ArvinMichael Xu 12/26/2013, 7:48 AM [phone]

## 2013-12-26 NOTE — Progress Notes (Signed)
Subjective:  Wants to have angio and not amputation   Objective:  Filed Vitals:   12/25/13 1830 12/25/13 2000 12/26/13 0015 12/26/13 0500  BP: 149/92 136/81 131/85 143/83  Pulse: 87 84 83 18  Temp: 99 F (37.2 C) 98.8 F (37.1 C) 98.2 F (36.8 C) 98.1 F (36.7 C)  TempSrc: Oral Oral    Resp: 18 16 16  76  Height: 5\' 2"  (1.575 m)     Weight: 180 lb (81.647 kg)     SpO2: 99% 100% 100% 100%    Intake/Output from previous day:  Intake/Output Summary (Last 24 hours) at 12/26/13 0819 Last data filed at 12/25/13 1900  Gross per 24 hour  Intake    360 ml  Output      0 ml  Net    360 ml    Physical Exam: Affect appropriate Chronically ill black male  HEENT: normal Neck supple with no adenopathy JVP normal no bruits no thyromegaly Lungs clear with no wheezing and good diaphragmatic motion Heart:  S1/S2 no murmur, no rub, gallop or click PMI normal Abdomen: benighn, BS positve, no tenderness, no AAA no bruit.  No HSM or HJR Gangrene left foot  No edema Neuro non-focal Skin warm and dry No muscular weakness   Lab Results: Basic Metabolic Panel:  Recent Labs  57/84/6904/14/15 1442 12/26/13 0545  NA 143 142  K 3.5* 4.0  CL 100 101  CO2 25 26  GLUCOSE 96 110*  BUN 11 15  CREATININE 0.65 0.75  CALCIUM 9.8 9.5   Liver Function Tests:  Recent Labs  12/25/13 1442  AST 21  ALT 22  ALKPHOS 103  BILITOT 0.3  PROT 8.5*  ALBUMIN 4.3   No results found for this basename: LIPASE, AMYLASE,  in the last 72 hours CBC:  Recent Labs  12/25/13 1442 12/26/13 0545  WBC 12.5* 11.2*  HGB 13.8 13.6  HCT 39.3 39.2  MCV 92.5 94.5  PLT 375 363    Imaging: Dg Foot Complete Left  12/25/2013   CLINICAL DATA:  Infection.  Evaluate for osteomyelitis.  EXAM: LEFT FOOT - COMPLETE 3+ VIEW  COMPARISON:  11/28/2013  FINDINGS: There is a soft tissue ulcer on the plantar margin of the forefoot below the metatarsophalangeal joints. There is lucency of the second, third and fourth  metatarsal heads and the medial base of the proximal phalanx of the fourth toe. This is most prominent involving the fourth metatarsal head where there is loss of the white cortical line. Surrounding forefoot soft tissue swelling is seen.  There is degenerative spurring and along the dorsal midfoot. There is mild asymmetric joint space narrowing of the first metatarsophalangeal joint. No other arthropathic change.  Bones are demineralized.  IMPRESSION: 1. Findings consistent with osteomyelitis the head of the fourth metatarsal and the medial base of the proximal phalanx of the fourth toe. There is possible additional osteomyelitis in the heads of the third and second metatarsals. There is a plantar ulcer along the plantar aspect of the forefoot below the second and third metatarsophalangeal joints.   Electronically Signed   By: Amie Portlandavid  Ormond M.D.   On: 12/25/2013 16:20    Cardiac Studies:  ECG: NSR normal ECG   Telemetry:  NSR no arrhythmia   Echo:   Medications:   . folic acid  1 mg Oral Daily  . heparin  5,000 Units Subcutaneous 3 times per day  . multivitamin with minerals  1 tablet Oral Daily  .  thiamine  100 mg Oral Daily   Or  . thiamine  100 mg Intravenous Daily       Assessment/Plan:  PVD:  Per Dr Myra GianottiBrabham  Would be nice for patient to be on vascular service. No antibiotics or wound care written For Hopefully this will be part of post angio orders.  I agree with Dr Roda ShuttersXu he will eventually come to amputation but Patient has not wrapped his head around this idea yet  Wendall Stadeeter C Merit Maybee 12/26/2013, 8:19 AM

## 2013-12-26 NOTE — H&P (View-Only) (Signed)
[facility] [place] [place] CONSULT [place]   MRN : [id number]  Reason for Consult: Ischemic left lower extremity Referring Physician: Dr. Allyson SabalBerry  History of Present Illness: 58 y/o male with chief complaint of left lower extremity pain.  He was last seen by Dr. Myra GianottiBrabham on 11/06/2013.  His plan at the time was the need for angiography to better define his vascular anatomy.  He was unable to tolerate ABI's due to pain on his previous visit.  He will likely require re-vascularization in attempt to salvage his foot.  The patient states his left foot feels better when it is in the dependent position.   Past medical history includes hypertension which is medically managed, gout and alcohol abuse. He is a current smoker. He denise hyperlipidemia and DM. Musculoskeletal surgery history bilateral ankle fusions per patient reason unknown.        Current Facility-Administered Medications  Medication Dose Route Frequency Provider Last Rate Last Dose  . acetaminophen (TYLENOL) tablet 650 mg  650 mg Oral Q6H PRN Brittainy Simmons, PA-C       Or  . acetaminophen (TYLENOL) suppository 650 mg  650 mg Rectal Q6H PRN Brittainy Simmons, PA-C      . heparin injection 5,000 Units  5,000 Units Subcutaneous 3 times per day Brittainy Simmons, PA-C        Pt meds include: Statin :No Betablocker: No ASA: No Other anticoagulants/antiplatelets:   Past Medical History  Diagnosis Date  . Gout   . Hypertension   . ETOH abuse   . Critical lower limb ischemia     Past Surgical History  Procedure Laterality Date  . Leg surgery      Social History History  Substance Use Topics  . Smoking status: Current Every Day Smoker -- 0.50 packs/day for 40 years    Types: Cigarettes  . Smokeless tobacco: Never Used  . Alcohol Use: 72.0 oz/week    120 Cans of beer per week     Comment: "A whole Bunch - I am a drunk"   He has a significant drinking history and has drank a bottle of wine prior to  admission today.  Family History Father HTN Mother DM, HTN  No Known Allergies   REVIEW OF SYSTEMS  General: [ ]  Weight loss, [ ]  Fever, [ ]  chills Neurologic: [ ]  Dizziness, [ ]  Blackouts, [ ]  Seizure [ ]  Stroke, [ ]  "Mini stroke", [ ]  Slurred speech, [ ]  Temporary blindness; [ ]  weakness in arms or legs, [ ]  Hoarseness [ ]  Dysphagia Cardiac: [ ]  Chest pain/pressure, [ ]  Shortness of breath at rest [ ]  Shortness of breath with exertion, [ ]  Atrial fibrillation or irregular heartbeat  Vascular: [x ] Pain in legs with walking, [x ] Pain in legs at rest, [x ] Pain in legs at night,  [x ] Non-healing ulcer, [ ]  Blood clot in vein/DVT,   Pulmonary: [ ]  Home oxygen, [ ]  Productive cough, [ ]  Coughing up blood, [ ]  Asthma,  [ ]  Wheezing [ ]  COPD Musculoskeletal:  [ ]  Arthritis, [ ]  Low back pain, [ ]  Joint pain Hematologic: [ ]  Easy Bruising, [ ]  Anemia; [ ]  Hepatitis Gastrointestinal: [ ]  Blood in stool, [ ]  Gastroesophageal Reflux/heartburn, Urinary: [ ]  chronic Kidney disease, [ ]  on HD - [ ]  MWF or [ ]  TTHS, [ ]  Burning with urination, [ ]  Difficulty urinating Skin: [ ]  Rashes, [x ] Wounds Psychological: [ ]  Anxiety, [ ]  Depression  Physical Examination  General:  WDWN in NAD HENT: WNL Eyes: Pupils equal Pulmonary: normal non-labored breathing , without Rales, rhonchi,  wheezing Cardiac: RRR, without  Murmurs, rubs or gallops; No carotid bruits Abdomen: soft, NT, no masses Skin: no rashes, ulcers noted;  no Gangrene , no cellulitis; Positive open wounds plantar metatarsal head third digit, superficial wounds on dorsum of left foot and dry gangrene of third digit.  Positive edema foot and ankle.  Vascular Exam/Pulses:Palpable bilateral femoral pulses, DP/PT palpable right.  No palpable left foot pulses.     Musculoskeletal: no muscle wasting or atrophy; no edema  Neurologic: A&O X 3; Appropriate Affect ;  SENSATION: normal; MOTOR FUNCTION: 5/5 Symmetric Speech is  fluent/normal   Significant Diagnostic Studies: CBC Lab Results  Component Value Date   WBC 13.5* 11/07/2013   HGB 12.4* 11/07/2013   HCT 36.7* 11/07/2013   MCV 93.9 11/07/2013   PLT 372 11/07/2013    BMET    Component Value Date/Time   NA 140 11/07/2013 0400   K 4.0 11/07/2013 0400   CL 101 11/07/2013 0400   CO2 26 11/07/2013 0400   GLUCOSE 110* 11/07/2013 0400   BUN 16 11/07/2013 0400   CREATININE 1.10 11/07/2013 0400   CALCIUM 9.0 11/07/2013 0400   GFRNONAA 73* 11/07/2013 0400   GFRAA 85* 11/07/2013 0400   The CrCl is unknown because both a height and weight (above a minimum accepted value) are required for this calculation.  COAG No results found for this basename: INR, PROTIME     Non-Invasive Vascular Imaging:  ABI's unable to tolerate  ASSESSMENT/PLAN:  Chronic left LE PAD   Plan angiogram to decide by-pass possibility verses imputation.  I will discuss this with Dr. Myra GianottiBrabham.  He is on Cipro BID     Lars Magemma M Collins 12/25/2013 2:24 PM  I agree with the above.  I have seen and examined the patient.  He has ulceration and incection on the dorsum of his left foot extending down to the 3rd toe.  Duplex ultrasound on 4/7 reveals normal waveforms down to the popliteal artery with monophasic tibial waveforms, suggesting severe tibial disease.  I have discussed proceeding with angiography in the morning with the patient to evaluate his circulation and to see what options he has for revascularization.  I will proceed with revascularization as indicated at the time of angiography.  Even with successful revascularization, he is at risk for BKA given the extent of infection / ulceration.  Regardless, he will need debridement / amputation of his forefoot.  This will likely require a significant amount of wound care.  He ahs been non-compliant in the past, so this may be an issue.  Durene CalWells Jestin Burbach

## 2013-12-26 NOTE — Interval H&P Note (Signed)
History and Physical Interval Note:  12/26/2013 9:07 AM  Ronald ManuelNebraska Hockey  has presented today for surgery, with the diagnosis of PAD  The various methods of treatment have been discussed with the patient and family. After consideration of risks, benefits and other options for treatment, the patient has consented to  Procedure(s): ABDOMINAL AORTAGRAM (N/A) as a surgical intervention .  The patient's history has been reviewed, patient examined, no change in status, stable for surgery.  I have reviewed the patient's chart and labs.  Questions were answered to the patient's satisfaction.     Nada LibmanVance W Brabham

## 2013-12-27 ENCOUNTER — Other Ambulatory Visit (HOSPITAL_COMMUNITY): Payer: Self-pay | Admitting: Orthopedic Surgery

## 2013-12-27 LAB — CBC
HCT: 37.7 % — ABNORMAL LOW (ref 39.0–52.0)
Hemoglobin: 13 g/dL (ref 13.0–17.0)
MCH: 32.3 pg (ref 26.0–34.0)
MCHC: 34.5 g/dL (ref 30.0–36.0)
MCV: 93.5 fL (ref 78.0–100.0)
PLATELETS: 352 10*3/uL (ref 150–400)
RBC: 4.03 MIL/uL — AB (ref 4.22–5.81)
RDW: 15.5 % (ref 11.5–15.5)
WBC: 13.2 10*3/uL — ABNORMAL HIGH (ref 4.0–10.5)

## 2013-12-27 LAB — BASIC METABOLIC PANEL
BUN: 14 mg/dL (ref 6–23)
CHLORIDE: 98 meq/L (ref 96–112)
CO2: 24 mEq/L (ref 19–32)
CREATININE: 0.83 mg/dL (ref 0.50–1.35)
Calcium: 9.2 mg/dL (ref 8.4–10.5)
Glucose, Bld: 111 mg/dL — ABNORMAL HIGH (ref 70–99)
POTASSIUM: 4.7 meq/L (ref 3.7–5.3)
Sodium: 138 mEq/L (ref 137–147)

## 2013-12-27 MED ORDER — CEFAZOLIN SODIUM-DEXTROSE 2-3 GM-% IV SOLR
2.0000 g | INTRAVENOUS | Status: AC
  Administered 2013-12-28: 2 g via INTRAVENOUS
  Filled 2013-12-27: qty 50

## 2013-12-27 NOTE — Progress Notes (Signed)
Patient ID: Ronald ManuelNebraska Biagini, male   DOB: 04/27/1956, 58 y.o.   MRN: [id number]     Patient Name: Ronald [name] Date of Encounter: 12/27/2013  Principal Problem:   Critical lower limb ischemia Active Problems:   HTN (hypertension), benign   Osteomyelitis of left foot    Patient Profile: 58 yo male w/ HTN, gout, ETOH, tob. LLE pain--> angio w/ severe L tibial dz, u/a to do PCI  SUBJECTIVE: No chest pain, no SOB, accepting he has to have BKA, scheduled for tomorrow.  OBJECTIVE Filed Vitals:   12/26/13 1221 12/26/13 1400 12/26/13 2100 12/27/13 0500  BP: 162/97 157/88 168/103 142/75  Pulse: 91 104 105 103  Temp:  98.4 F (36.9 C) 99 F (37.2 C) 99.2 F (37.3 C)  TempSrc:  Oral    Resp:  16 18 20   Height:      Weight:      SpO2:  100% 100% 98%    Intake/Output Summary (Last 24 hours) at 12/27/13 0858 Last data filed at 12/27/13 0500  Gross per 24 hour  Intake      0 ml  Output    800 ml  Net   -800 ml   Filed Weights   12/25/13 1830  Weight: 180 lb (81.647 kg)    PHYSICAL EXAM General: Well developed, well nourished, male in no acute distress. Head: Normocephalic, atraumatic.  Neck: Supple without bruits, JVD not elevated. Lungs:  Resp regular and unlabored, CTA. Heart: RRR, S1, S2, no S3, S4, or murmur; no rub. Abdomen: Soft, non-tender, non-distended, BS + x 4.  Extremities: No clubbing, cyanosis, no edema.  Gangrene left foot Neuro: Alert and oriented X 3. Moves all extremities spontaneously. Psych: Normal affect.  LABS: CBC:  Recent Labs  12/26/13 0545 12/27/13 0310  WBC 11.2* 13.2*  HGB 13.6 13.0  HCT 39.2 37.7*  MCV 94.5 93.5  PLT 363 352   INR:  Recent Labs  12/25/13 1442  INR 0.96   Basic Metabolic Panel:  Recent Labs  40/98/1104/15/15 0545 12/27/13 0602  NA 142 138  K 4.0 4.7  CL 101 98  CO2 26 24  GLUCOSE 110* 111*  BUN 15 14  CREATININE 0.75 0.83  CALCIUM 9.5 9.2   Liver Function Tests:  Recent Labs  12/25/13 1442  AST 21   ALT 22  ALKPHOS 103  BILITOT 0.3  PROT 8.5*  ALBUMIN 4.3    TELE:    SR/ST    Radiology/Studies: Dg Foot Complete Left 12/25/2013   CLINICAL DATA:  Infection.  Evaluate for osteomyelitis.  EXAM: LEFT FOOT - COMPLETE 3+ VIEW  COMPARISON:  11/28/2013  FINDINGS: There is a soft tissue ulcer on the plantar margin of the forefoot below the metatarsophalangeal joints. There is lucency of the second, third and fourth metatarsal heads and the medial base of the proximal phalanx of the fourth toe. This is most prominent involving the fourth metatarsal head where there is loss of the white cortical line. Surrounding forefoot soft tissue swelling is seen.  There is degenerative spurring and along the dorsal midfoot. There is mild asymmetric joint space narrowing of the first metatarsophalangeal joint. No other arthropathic change.  Bones are demineralized.  IMPRESSION: 1. Findings consistent with osteomyelitis the head of the fourth metatarsal and the medial base of the proximal phalanx of the fourth toe. There is possible additional osteomyelitis in the heads of the third and second metatarsals. There is a plantar ulcer along the plantar aspect of the forefoot  below the second and third metatarsophalangeal joints.   Electronically Signed   By: Amie Portlandavid  Ormond M.D.   On: 12/25/2013 16:20    Current Medications:  . folic acid  1 mg Oral Daily  . heparin  5,000 Units Subcutaneous 3 times per day  . multivitamin with minerals  1 tablet Oral Daily  . thiamine  100 mg Oral Daily   Or  . thiamine  100 mg Intravenous Daily   . sodium chloride 50 mL/hr at 12/26/13 1054    ASSESSMENT AND PLAN: LLE limb ischemia Failed attempt at revascularization  ? LBKA in am  Patient should be on ortho or vascular service  ? Antibiotics Needs preop orders  PA to contact Dr Myra GianottiBrabham and Dr Roda ShuttersXu for orders and transfer to their service    Signed, Wendall StadePeter C Equan Cogbill ,  8:58 AM 12/27/2013

## 2013-12-27 NOTE — Progress Notes (Signed)
Patient Name: Ronald [name] Date of Encounter: 12/27/2013  Principal Problem:   Critical lower limb ischemia Active Problems:   HTN (hypertension), benign   Osteomyelitis of left foot    Patient Profile: 58 yo male w/ HTN, gout, ETOH, tob. LLE pain--> angio w/ severe L tibial dz, u/a to do PCI  SUBJECTIVE: No chest pain, no SOB, accepting he has to have BKA, scheduled for tomorrow.  OBJECTIVE Filed Vitals:   12/26/13 1221 12/26/13 1400 12/26/13 2100 12/27/13 0500  BP: 162/97 157/88 168/103 142/75  Pulse: 91 104 105 103  Temp:  98.4 F (36.9 C) 99 F (37.2 C) 99.2 F (37.3 C)  TempSrc:  Oral    Resp:  16 18 20   Height:      Weight:      SpO2:  100% 100% 98%    Intake/Output Summary (Last 24 hours) at 12/27/13 [id number] Last data filed at 12/27/13 0500  Gross per 24 hour  Intake      0 ml  Output    800 ml  Net   -800 ml   Filed Weights   12/25/13 1830  Weight: 180 lb (81.647 kg)    PHYSICAL EXAM General: Well developed, well nourished, male in no acute distress. Head: Normocephalic, atraumatic.  Neck: Supple without bruits, JVD not elevated. Lungs:  Resp regular and unlabored, CTA. Heart: RRR, S1, S2, no S3, S4, or murmur; no rub. Abdomen: Soft, non-tender, non-distended, BS + x 4.  Extremities: No clubbing, cyanosis, no edema.  Gangrene left foot Neuro: Alert and oriented X 3. Moves all extremities spontaneously. Psych: Normal affect.  LABS: CBC:  Recent Labs  12/26/13 0545 12/27/13 0310  WBC 11.2* 13.2*  HGB 13.6 13.0  HCT 39.2 37.7*  MCV 94.5 93.5  PLT 363 352   INR:  Recent Labs  12/25/13 1442  INR 0.96   Basic Metabolic Panel:  Recent Labs  14/78/2904/15/15 0545 12/27/13 0602  NA 142 138  K 4.0 4.7  CL 101 98  CO2 26 24  GLUCOSE 110* 111*  BUN 15 14  CREATININE 0.75 0.83  CALCIUM 9.5 9.2   Liver Function Tests:  Recent Labs  12/25/13 1442  AST 21  ALT 22  ALKPHOS 103  BILITOT 0.3  PROT 8.5*  ALBUMIN 4.3    TELE:     SR/ST    Radiology/Studies: Dg Foot Complete Left 12/25/2013   CLINICAL DATA:  Infection.  Evaluate for osteomyelitis.  EXAM: LEFT FOOT - COMPLETE 3+ VIEW  COMPARISON:  11/28/2013  FINDINGS: There is a soft tissue ulcer on the plantar margin of the forefoot below the metatarsophalangeal joints. There is lucency of the second, third and fourth metatarsal heads and the medial base of the proximal phalanx of the fourth toe. This is most prominent involving the fourth metatarsal head where there is loss of the white cortical line. Surrounding forefoot soft tissue swelling is seen.  There is degenerative spurring and along the dorsal midfoot. There is mild asymmetric joint space narrowing of the first metatarsophalangeal joint. No other arthropathic change.  Bones are demineralized.  IMPRESSION: 1. Findings consistent with osteomyelitis the head of the fourth metatarsal and the medial base of the proximal phalanx of the fourth toe. There is possible additional osteomyelitis in the heads of the third and second metatarsals. There is a plantar ulcer along the plantar aspect of the forefoot below the second and third metatarsophalangeal joints.   Electronically Signed   By: Onalee Huaavid  Ormond M.D.   On: 12/25/2013 16:20    Current Medications:  . folic acid  1 mg Oral Daily  . heparin  5,000 Units Subcutaneous 3 times per day  . multivitamin with minerals  1 tablet Oral Daily  . thiamine  100 mg Oral Daily   Or  . thiamine  100 mg Intravenous Daily   . sodium chloride 50 mL/hr at 12/26/13 1054    ASSESSMENT AND PLAN: Principal Problem:   Critical lower limb ischemia - for BKA tomorrow, per orthopedics. MD advise on adding ABX, transferring care to ortho.  Active Problems:   HTN (hypertension), benign - on triam/HCTZ 37.5/25 PTA, restart when OK w/ ortho/[facility]. May benefit from a BB.    Osteomyelitis of left foot - ABX per [facility]   Signed, Darrol JumpRhonda G Barrett , PA-C 8:52 AM 12/27/2013

## 2013-12-27 NOTE — Progress Notes (Signed)
[facility] [place]  Subjective  - He is still having a lot of pain in the left foot and has to have it in a dependent position.   Objective 142/75 103 99.2 F (37.3 C) (Oral) 20 98%  Intake/Output Summary (Last 24 hours) at 12/27/13 1132 Last data filed at 12/27/13 0900  Gross per 24 hour  Intake    360 ml  Output    800 ml  Net   -440 ml    Right groin is soft without hematoma No change is left foot ulcers  Assessment/Planning: POD #1 Procedure Performed:  1. ultrasound-guided access, right femoral artery  2. abdominal aortogram  3. left lower extremity runoff  4. additional order catheterization (left anterior tibial artery)  5. failed angioplasty, left anterior tibial artery Impression:  #1 severe left tibial disease with occlusion of all 3 tibial vessels at their origin. There is delayed reconstitution of the dorsalis pedis artery.  #2 failed attempt at anterior tibial recanalization, left  #3 the patient is not a candidate for surgical revascularization given the location of the wound on his foot, and the distal target vessel  We will be signing off at this time.    Lars Magemma M Khiana Camino 12/27/2013 11:32 AM --  Laboratory Lab Results:  Recent Labs  12/26/13 0545 12/27/13 0310  WBC 11.2* 13.2*  HGB 13.6 13.0  HCT 39.2 37.7*  PLT 363 352   BMET  Recent Labs  12/26/13 0545 12/27/13 0602  NA 142 138  K 4.0 4.7  CL 101 98  CO2 26 24  GLUCOSE 110* 111*  BUN 15 14  CREATININE 0.75 0.83  CALCIUM 9.5 9.2    COAG Lab Results  Component Value Date   INR 0.96 12/25/2013   No results found for this basename: PTT

## 2013-12-28 ENCOUNTER — Inpatient Hospital Stay (HOSPITAL_COMMUNITY): Payer: Medicaid Other | Admitting: Anesthesiology

## 2013-12-28 ENCOUNTER — Encounter (HOSPITAL_COMMUNITY): Payer: Medicaid Other | Admitting: Anesthesiology

## 2013-12-28 ENCOUNTER — Encounter (HOSPITAL_COMMUNITY)
Admission: AD | Disposition: A | Discharge status: Skilled Nursing Facility | Payer: Self-pay | Source: Ambulatory Visit | Attending: Orthopaedic Surgery

## 2013-12-28 ENCOUNTER — Encounter (HOSPITAL_COMMUNITY): Payer: Self-pay | Admitting: Certified Registered"

## 2013-12-28 HISTORY — PX: AMPUTATION: SHX166

## 2013-12-28 LAB — BASIC METABOLIC PANEL
BUN: 10 mg/dL (ref 6–23)
CALCIUM: 9.5 mg/dL (ref 8.4–10.5)
CO2: 23 meq/L (ref 19–32)
CREATININE: 0.68 mg/dL (ref 0.50–1.35)
Chloride: 100 mEq/L (ref 96–112)
GFR calc Af Amer: >90 mL/min (ref >90)
GFR calc non Af Amer: >90 mL/min (ref >90)
GLUCOSE: 112 mg/dL — AB (ref 70–99)
Potassium: 4.2 mEq/L (ref 3.7–5.3)
Sodium: 137 mEq/L (ref 137–147)

## 2013-12-28 LAB — CBC
HCT: 39.5 % (ref 39.0–52.0)
HEMOGLOBIN: 13.2 g/dL (ref 13.0–17.0)
MCH: 31.8 pg (ref 26.0–34.0)
MCHC: 33.4 g/dL (ref 30.0–36.0)
MCV: 95.2 fL (ref 78.0–100.0)
Platelets: 360 10*3/uL (ref 150–400)
RBC: 4.15 MIL/uL — ABNORMAL LOW (ref 4.22–5.81)
RDW: 15.3 % (ref 11.5–15.5)
WBC: 11.4 10*3/uL — ABNORMAL HIGH (ref 4.0–10.5)

## 2013-12-28 LAB — GLUCOSE, CAPILLARY: GLUCOSE-CAPILLARY: 117 mg/dL — AB (ref 70–99)

## 2013-12-28 SURGERY — AMPUTATION BELOW KNEE
Anesthesia: General | Laterality: Left

## 2013-12-28 MED ORDER — 0.9 % SODIUM CHLORIDE (POUR BTL) OPTIME
TOPICAL | Status: DC | PRN
  Administered 2013-12-28: 1000 mL

## 2013-12-28 MED ORDER — PROPOFOL 10 MG/ML IV BOLUS
INTRAVENOUS | Status: DC | PRN
  Administered 2013-12-28: 200 mg via INTRAVENOUS

## 2013-12-28 MED ORDER — ONDANSETRON HCL 4 MG/2ML IJ SOLN: 4.0000 mg | Freq: Four times a day (QID) | INTRAMUSCULAR | Status: DC | PRN

## 2013-12-28 MED ORDER — FENTANYL CITRATE 0.05 MG/ML IJ SOLN
INTRAMUSCULAR | Status: DC | PRN
  Administered 2013-12-28 (×2): 100 ug via INTRAVENOUS
  Administered 2013-12-28: 50 ug via INTRAVENOUS

## 2013-12-28 MED ORDER — FENTANYL CITRATE 0.05 MG/ML IJ SOLN
INTRAMUSCULAR | Status: AC
  Filled 2013-12-28: qty 2

## 2013-12-28 MED ORDER — OXYCODONE-ACETAMINOPHEN 5-325 MG PO TABS
ORAL_TABLET | ORAL | Status: AC
  Filled 2013-12-28: qty 2

## 2013-12-28 MED ORDER — HYDRALAZINE HCL 20 MG/ML IJ SOLN
INTRAMUSCULAR | Status: AC
  Filled 2013-12-28: qty 1

## 2013-12-28 MED ORDER — LACTATED RINGERS IV SOLN
INTRAVENOUS | Status: DC
  Administered 2013-12-28: 16:00:00 via INTRAVENOUS

## 2013-12-28 MED ORDER — HYDROMORPHONE HCL PF 1 MG/ML IJ SOLN
INTRAMUSCULAR | Status: AC
  Filled 2013-12-28: qty 1

## 2013-12-28 MED ORDER — LIDOCAINE HCL (CARDIAC) 20 MG/ML IV SOLN
INTRAVENOUS | Status: DC | PRN
  Administered 2013-12-28: 80 mg via INTRAVENOUS

## 2013-12-28 MED ORDER — CHLORHEXIDINE GLUCONATE 4 % EX LIQD
60.0000 mL | Freq: Once | CUTANEOUS | Status: AC
  Administered 2013-12-28: 4 via TOPICAL
  Filled 2013-12-28: qty 30

## 2013-12-28 MED ORDER — HYDROMORPHONE HCL PF 1 MG/ML IJ SOLN
0.2500 mg | INTRAMUSCULAR | Status: DC | PRN
  Administered 2013-12-28: 0.5 mg via INTRAVENOUS
  Administered 2013-12-28: 1 mg via INTRAVENOUS
  Administered 2013-12-28: 0.5 mg via INTRAVENOUS

## 2013-12-28 MED ORDER — FENTANYL CITRATE 0.05 MG/ML IJ SOLN
INTRAMUSCULAR | Status: AC
  Filled 2013-12-28: qty 5

## 2013-12-28 MED ORDER — CEFAZOLIN SODIUM 1-5 GM-% IV SOLN
1.0000 g | Freq: Four times a day (QID) | INTRAVENOUS | Status: AC
  Administered 2013-12-28 – 2013-12-29 (×3): 1 g via INTRAVENOUS
  Filled 2013-12-28 (×3): qty 50

## 2013-12-28 MED ORDER — LABETALOL HCL 5 MG/ML IV SOLN
INTRAVENOUS | Status: AC
  Filled 2013-12-28: qty 4

## 2013-12-28 MED ORDER — ONDANSETRON HCL 4 MG/2ML IJ SOLN: 4.0000 mg | Freq: Once | INTRAMUSCULAR | Status: DC | PRN

## 2013-12-28 MED ORDER — SODIUM CHLORIDE 0.9 % IV SOLN
INTRAVENOUS | Status: DC
  Administered 2013-12-28: 20:00:00 via INTRAVENOUS

## 2013-12-28 MED ORDER — PROPOFOL 10 MG/ML IV BOLUS
INTRAVENOUS | Status: AC
  Filled 2013-12-28: qty 20

## 2013-12-28 MED ORDER — FENTANYL CITRATE 0.05 MG/ML IJ SOLN
100.0000 ug | Freq: Once | INTRAMUSCULAR | Status: AC
  Administered 2013-12-28: 100 ug via INTRAVENOUS

## 2013-12-28 MED ORDER — ONDANSETRON HCL 4 MG PO TABS
4.0000 mg | ORAL_TABLET | Freq: Four times a day (QID) | ORAL | Status: DC | PRN
Start: 2013-12-28 — End: 2014-01-01

## 2013-12-28 MED ORDER — SODIUM CHLORIDE 0.9 % IV SOLN: INTRAVENOUS | Status: DC

## 2013-12-28 SURGICAL SUPPLY — 44 items
BANDAGE ESMARK 6X9 LF (GAUZE/BANDAGES/DRESSINGS) ×1 IMPLANT
BANDAGE GAUZE ELAST BULKY 4 IN (GAUZE/BANDAGES/DRESSINGS) ×3 IMPLANT
BLADE SAW RECIP 87.9 MT (BLADE) ×3 IMPLANT
BLADE SURG 21 STRL SS (BLADE) ×3 IMPLANT
BNDG CMPR 9X6 STRL LF SNTH (GAUZE/BANDAGES/DRESSINGS) ×1
BNDG COHESIVE 6X5 TAN STRL LF (GAUZE/BANDAGES/DRESSINGS) ×3 IMPLANT
BNDG ESMARK 6X9 LF (GAUZE/BANDAGES/DRESSINGS) ×3
COVER SURGICAL LIGHT HANDLE (MISCELLANEOUS) ×3 IMPLANT
CUFF TOURNIQUET SINGLE 34IN LL (TOURNIQUET CUFF) ×3 IMPLANT
CUFF TOURNIQUET SINGLE 44IN (TOURNIQUET CUFF) IMPLANT
DRAIN PENROSE 1/2X12 LTX STRL (WOUND CARE) IMPLANT
DRAPE EXTREMITY T 121X128X90 (DRAPE) ×3 IMPLANT
DRAPE PROXIMA HALF (DRAPES) ×6 IMPLANT
DRAPE U-SHAPE 47X51 STRL (DRAPES) ×6 IMPLANT
DRSG ADAPTIC 3X8 NADH LF (GAUZE/BANDAGES/DRESSINGS) ×3 IMPLANT
DRSG PAD ABDOMINAL 8X10 ST (GAUZE/BANDAGES/DRESSINGS) ×3 IMPLANT
DURAPREP 26ML APPLICATOR (WOUND CARE) ×3 IMPLANT
ELECT REM PT RETURN 9FT ADLT (ELECTROSURGICAL) ×3
ELECTRODE REM PT RTRN 9FT ADLT (ELECTROSURGICAL) ×1 IMPLANT
GLOVE BIOGEL PI IND STRL 9 (GLOVE) ×1 IMPLANT
GLOVE BIOGEL PI INDICATOR 9 (GLOVE) ×2
GLOVE SURG ORTHO 9.0 STRL STRW (GLOVE) ×3 IMPLANT
GOWN STRL REUS W/ TWL XL LVL3 (GOWN DISPOSABLE) ×2 IMPLANT
GOWN STRL REUS W/TWL XL LVL3 (GOWN DISPOSABLE) ×6
KIT BASIN OR (CUSTOM PROCEDURE TRAY) ×3 IMPLANT
KIT ROOM TURNOVER OR (KITS) ×3 IMPLANT
MANIFOLD NEPTUNE II (INSTRUMENTS) ×3 IMPLANT
NS IRRIG 1000ML POUR BTL (IV SOLUTION) ×3 IMPLANT
PACK GENERAL/GYN (CUSTOM PROCEDURE TRAY) ×3 IMPLANT
PAD ABD 8X10 STRL (GAUZE/BANDAGES/DRESSINGS) ×3 IMPLANT
PAD ARMBOARD 7.5X6 YLW CONV (MISCELLANEOUS) ×6 IMPLANT
SPONGE GAUZE 4X4 12PLY (GAUZE/BANDAGES/DRESSINGS) ×3 IMPLANT
SPONGE GAUZE 4X4 12PLY STER LF (GAUZE/BANDAGES/DRESSINGS) ×3 IMPLANT
SPONGE LAP 18X18 X RAY DECT (DISPOSABLE) IMPLANT
STAPLER VISISTAT 35W (STAPLE) ×3 IMPLANT
STOCKINETTE IMPERVIOUS LG (DRAPES) ×3 IMPLANT
SUT PDS AB 1 CT  36 (SUTURE)
SUT PDS AB 1 CT 36 (SUTURE) IMPLANT
SUT SILK 2 0 (SUTURE) ×3
SUT SILK 2-0 18XBRD TIE 12 (SUTURE) ×1 IMPLANT
TOWEL OR 17X24 6PK STRL BLUE (TOWEL DISPOSABLE) ×3 IMPLANT
TOWEL OR 17X26 10 PK STRL BLUE (TOWEL DISPOSABLE) ×3 IMPLANT
TUBE ANAEROBIC SPECIMEN COL (MISCELLANEOUS) IMPLANT
WATER STERILE IRR 1000ML POUR (IV SOLUTION) ×3 IMPLANT

## 2013-12-28 NOTE — Transfer of Care (Signed)
Immediate Anesthesia Transfer of Care Note  Patient: Ronald [name]  Procedure(s) Performed: Procedure(s): LEFT BELOW KNEE AMPUTATION (Left)  Patient Location: PACU  Anesthesia Type:General  Level of Consciousness: awake and patient cooperative  Airway & Oxygen Therapy: Patient Spontanous Breathing  Post-op Assessment: Report given to PACU RN and Post -op Vital signs reviewed and stable  Post vital signs: Reviewed and stable  Complications: No apparent anesthesia complications

## 2013-12-28 NOTE — Progress Notes (Signed)
Pt BP 173/96, Dr.XU made aware, no new orders given . Will continue to monitor patient.

## 2013-12-28 NOTE — Op Note (Addendum)
OPERATIVE REPORT  DATE OF SURGERY: 12/28/2013  PATIENT:  Ronald [name],  58 y.o. male  PRE-OPERATIVE DIAGNOSIS:  Left foot infection  POST-OPERATIVE DIAGNOSIS:  Left foot infection  PROCEDURE:  Procedure(s): LEFT BELOW KNEE AMPUTATION  SURGEON:  Surgeon(s): Nadara MustardMarcus V Giannie Soliday, MD  ANESTHESIA:   general  EBL:  min ML  SPECIMEN:  Source of Specimen:  Left leg  TOURNIQUET:   Total Tourniquet Time Documented: Thigh (Left) - 6 minutes Total: Thigh (Left) - 6 minutes   PROCEDURE DETAILS: Patient is a [age] shows gentleman with severe peripheral vascular disease and presents at this time with ischemic left lower extremity. Patient is failed limb salvage and presents for transtibial amputation. Risks and benefits were discussed including potential for above-the-knee amputation. Patient states he understands and wished to proceed at this time. Description of procedure patient was brought to the operating room and underwent a general anesthetic. After adequate levels of anesthesia were obtained patient's left lower extremity was prepped using DuraPrep draped into a sterile field and the foot was draped out of the sterile field with an impervious stocking at. A transverse incision was made 11 cm distal to the tibial tubercle this curved proximally and a large posterior flap was created. The bone was transected 1 cm proximal to the skin incision both the tibia and fibula. A knife was used to create a large posterior flap. The sciatic nerve was pulled cut and allowed to retract. The vascular bundles were suture ligated with 2-0 silk. The tourniquet was deflated hemostasis was obtained. The deep and superficial fascial layers were closed using #1 PDS. The skin was closed using staples. The wound is covered with Adaptic orthopedic sponges AB dressing Kerlix and Coban. Patient was extubated taken to the PACU in stable condition, of note patient had extremely thin and chronically atrophic muscle and soft tissue  with extremely calcified vessels.  PLAN OF CARE: Admit to inpatient   PATIENT DISPOSITION:  PACU - hemodynamically stable.   Nadara MustardMarcus V Rosalinda Seaman, MD 12/28/2013 5:24 PM

## 2013-12-28 NOTE — Anesthesia Preprocedure Evaluation (Signed)
Anesthesia Evaluation  Patient identified by MRN, date of birth, ID band Patient awake    Reviewed: Allergy & Precautions, H&P , NPO status , Patient's Chart, lab work & pertinent test results  Airway       Dental   Pulmonary COPDCurrent Smoker,          Cardiovascular hypertension,     Neuro/Psych    GI/Hepatic (+)     substance abuse  alcohol use and cocaine use,   Endo/Other    Renal/GU      Musculoskeletal   Abdominal   Peds  Hematology   Anesthesia Other Findings   Reproductive/Obstetrics                           Anesthesia Physical Anesthesia Plan  ASA: III  Anesthesia Plan: General   Post-op Pain Management:    Induction: Intravenous  Airway Management Planned: LMA  Additional Equipment:   Intra-op Plan:   Post-operative Plan: Extubation in OR  Informed Consent: I have reviewed the patients History and Physical, chart, labs and discussed the procedure including the risks, benefits and alternatives for the proposed anesthesia with the patient or authorized representative who has indicated his/her understanding and acceptance.     Plan Discussed with:   Anesthesia Plan Comments:         Anesthesia Quick Evaluation

## 2013-12-28 NOTE — Interval H&P Note (Signed)
History and Physical Interval Note:  12/28/2013 6:27 AM  Ronald ManuelNebraska Cure  has presented today for surgery, with the diagnosis of Left foot infection  The various methods of treatment have been discussed with the patient and family. After consideration of risks, benefits and other options for treatment, the patient has consented to  Procedure(s): LEFT BELOW KNEE AMPUTATION (Left) as a surgical intervention .  The patient's history has been reviewed, patient examined, no change in status, stable for surgery.  I have reviewed the patient's chart and labs.  Questions were answered to the patient's satisfaction.     [name]

## 2013-12-28 NOTE — Anesthesia Procedure Notes (Signed)
Procedure Name: LMA Insertion Date/Time: 12/28/2013 4:40 PM Performed by: Arlice ColtMANESS, Kadien Lineman B Pre-anesthesia Checklist: Patient identified, Emergency Drugs available, Suction available, Patient being monitored and Timeout performed Patient Re-evaluated:Patient Re-evaluated prior to inductionOxygen Delivery Method: Circle system utilized Preoxygenation: Pre-oxygenation with 100% oxygen Intubation Type: IV induction LMA: LMA inserted LMA Size: 4.0 Number of attempts: 1 Airway Equipment and Method: Stylet Tube secured with: Tape Dental Injury: Teeth and Oropharynx as per pre-operative assessment

## 2013-12-29 ENCOUNTER — Encounter (HOSPITAL_COMMUNITY): Payer: Self-pay | Admitting: *Deleted

## 2013-12-29 LAB — BASIC METABOLIC PANEL
BUN: 12 mg/dL (ref 6–23)
CALCIUM: 9.2 mg/dL (ref 8.4–10.5)
CO2: 24 meq/L (ref 19–32)
CREATININE: 0.78 mg/dL (ref 0.50–1.35)
Chloride: 106 mEq/L (ref 96–112)
GFR calc Af Amer: >90 mL/min (ref >90)
Glucose, Bld: 117 mg/dL — ABNORMAL HIGH (ref 70–99)
Potassium: 4.5 mEq/L (ref 3.7–5.3)
Sodium: 142 mEq/L (ref 137–147)

## 2013-12-29 LAB — CBC
HCT: 33.8 % — ABNORMAL LOW (ref 39.0–52.0)
Hemoglobin: 11.3 g/dL — ABNORMAL LOW (ref 13.0–17.0)
MCH: 31.7 pg (ref 26.0–34.0)
MCHC: 33.4 g/dL (ref 30.0–36.0)
MCV: 94.9 fL (ref 78.0–100.0)
Platelets: 340 10*3/uL (ref 150–400)
RBC: 3.56 MIL/uL — ABNORMAL LOW (ref 4.22–5.81)
RDW: 15.4 % (ref 11.5–15.5)
WBC: 11.7 10*3/uL — ABNORMAL HIGH (ref 4.0–10.5)

## 2013-12-29 NOTE — Evaluation (Signed)
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   Clinical Impression  Pt admitted for L BKA. Pt currently with functional limitations due to the deficits listed below (see PT Problem List).  Pt will benefit from skilled PT to increase their independence and safety with mobility to allow discharge to the venue listed below. Pt will likely benefit from post-acute PT in an inpatient setting prior to DC home to maximize safety and independence.      Follow Up Recommendations SNF;Supervision for mobility/OOB    Equipment Recommendations  Rolling walker with 5" wheels (Can uses hids dads WC per pt)    Recommendations for Other Services       Precautions / Restrictions Precautions Precautions: Fall Restrictions Weight Bearing Restrictions: Yes      Mobility  Bed Mobility Overal bed mobility: Needs Assistance Bed Mobility:  (PT sitting EOB when PT arrived and was not assessed)              Transfers Overall transfer level: Needs assistance Equipment used: Rolling walker (2 wheeled) Transfers: Sit to/from Stand Sit to Stand: Min assist         General transfer comment: Performed twice for practice  Ambulation/Gait Ambulation/Gait assistance: Min assist Ambulation Distance (Feet): 10 Feet (ambulated 10 ft twice with seated rest break between) Assistive device: Rolling walker (2 wheeled) Gait Pattern/deviations:  (hop to pattern) Gait velocity: decreased   General Gait Details: Needed frequent verbal, visual, and tactile cues for safe gait technique  Stairs            Wheelchair Mobility    Modified Rankin (Stroke Patients Only)       Balance Overall balance assessment: Needs assistance         Standing balance support: Bilateral upper extremity supported Standing balance-Leahy Scale:  Poor Standing balance comment: Needs RW for support with standing                             Pertinent Vitals/Pain 5/10 pain in L BKA area.     Home Living Family/patient expects to be discharged to:: Skilled nursing facility                 Additional Comments: PT has 1 story house with ramped entrance. Needs to be fairly independent in order to safely DC home. Can use his dad's WC at DC      Prior Function Level of Independence: Independent with assistive device(s)         Comments: Used cane PTA     Hand Dominance        Extremity/Trunk Assessment   Upper Extremity Assessment: Overall WFL for tasks assessed           Lower Extremity Assessment: LLE deficits/detail   LLE Deficits / Details: L BKA. Overall limited ROM and strength  Cervical / Trunk Assessment: Normal  Communication   Communication: No difficulties  Cognition Arousal/Alertness: Awake/alert Behavior During Therapy: WFL for tasks assessed/performed Overall Cognitive Status: Within Functional Limits for tasks assessed                      General Comments General comments (skin integrity, edema, etc.): Several family members arrived at end of session.  After I instructed pt to only get up with nursing present he got  up and started to get back to bed without asking for help.  Chair alarm placed and RN aware.    Exercises Amputee Exercises Hip Flexion/Marching: AROM;Left;5 reps;Seated Knee Extension: AROM;Left;5 reps;Seated      Assessment/Plan    PT Assessment Patient needs continued PT services  PT Diagnosis Difficulty walking   PT Problem List Decreased strength;Decreased range of motion;Decreased balance;Decreased mobility;Decreased knowledge of use of DME;Decreased safety awareness  PT Treatment Interventions Gait training;DME instruction;Functional mobility training;Therapeutic exercise;Balance training;Patient/family education;Wheelchair mobility training    PT Goals (Current goals can be found in the Care Plan section) Acute Rehab PT Goals Patient Stated Goal: to be able to walk around PT Goal Formulation: With patient Time For Goal Achievement: 01/05/14 Potential to Achieve Goals: Good    Frequency Min 3X/week   Barriers to discharge Decreased caregiver support      Co-evaluation               End of Session Equipment Utilized During Treatment: Gait belt Activity Tolerance: Patient tolerated treatment well Patient left: in chair;with chair alarm set;with family/visitor present Nurse Communication: Mobility status;Other (comment) (Chair alarm in place)         Time: 9147-82951305-1337 PT Time Calculation (min): 32 min   Charges:   PT Evaluation $Initial PT Evaluation Tier I: 1 Procedure PT Treatments $Gait Training: 8-22 mins   PT G CodesDonnella [name]:          Ronald [name] J Nillie Bartolotta 12/29/2013, 1:52 PM Ronald [name], PT  [phone] 12/29/2013

## 2013-12-29 NOTE — Progress Notes (Signed)
Patient ID: Ronald ManuelNebraska Rust, male   DOB: 1956/03/01, 58 y.o.   MRN: [id number] Postoperative day 1 status post left transtibial amputation. Patient states that he is much more comfortable this morning. The dressing is clean and dry. Begin physical therapy progressive ambulation. Anticipate patient will need discharge to skilled nursing.

## 2013-12-29 NOTE — Care Management Note (Signed)
    Page 1 of 1   12/31/2013     11:07:15 AM CARE MANAGEMENT NOTE 12/31/2013  Patient:  Ronald [name],Ronald [name]   Account Number:  [id number]  Date Initiated:  12/29/2013  Documentation initiated by:  GRAVES-BIGELOW,Ronald [name]  Subjective/Objective Assessment:   Pt admitted for -Osteomyelitis of left foot-lower limb ischemia/ Post BKA.     Action/Plan:   Per MD notes pt will benefit from SNF at d/c. CM will continue to monitor for dispositon needs.   Anticipated DC Date:  12/31/2013   Anticipated DC Plan:  SKILLED NURSING FACILITY  In-house referral  Clinical Social Worker      DC Planning Services  CM consult      Choice offered to / List presented to:             Status of service:  Completed, signed off Medicare Important Message given?   (If response is "NO", the following Medicare IM given date fields will be blank) Date Medicare IM given:   Date Additional Medicare IM given:    Discharge Disposition:  SKILLED NURSING FACILITY  Per UR Regulation:  Reviewed for med. necessity/level of care/duration of stay  If discussed at Long Length of Stay Meetings, dates discussed:   01/01/2014    Comments:  12-31-13 [address] GravesWallaceton- [place], [place] [phone]  CM did call CIR to look at pt. Per PT/ OT notes recommending SNF at d/c. CSW to assist with disposition needs.

## 2013-12-30 LAB — CBC
HEMATOCRIT: 34.1 % — AB (ref 39.0–52.0)
Hemoglobin: 11.4 g/dL — ABNORMAL LOW (ref 13.0–17.0)
MCH: 31.7 pg (ref 26.0–34.0)
MCHC: 33.4 g/dL (ref 30.0–36.0)
MCV: 94.7 fL (ref 78.0–100.0)
Platelets: 386 10*3/uL (ref 150–400)
RBC: 3.6 MIL/uL — AB (ref 4.22–5.81)
RDW: 15.3 % (ref 11.5–15.5)
WBC: 11.6 10*3/uL — AB (ref 4.0–10.5)

## 2013-12-30 LAB — BASIC METABOLIC PANEL
BUN: 12 mg/dL (ref 6–23)
CHLORIDE: 103 meq/L (ref 96–112)
CO2: 25 mEq/L (ref 19–32)
Calcium: 9.4 mg/dL (ref 8.4–10.5)
Creatinine, Ser: 0.8 mg/dL (ref 0.50–1.35)
GFR calc Af Amer: >90 mL/min (ref >90)
GFR calc non Af Amer: >90 mL/min (ref >90)
Glucose, Bld: 107 mg/dL — ABNORMAL HIGH (ref 70–99)
POTASSIUM: 4.1 meq/L (ref 3.7–5.3)
Sodium: 139 mEq/L (ref 137–147)

## 2013-12-30 NOTE — Progress Notes (Signed)
Patient ID: Ronald ManuelNebraska Alviar, male   DOB: Oct 24, 1955, 58 y.o.   MRN: [id number] Postoperative day 2 transtibial amputation. Patient was given instructions on knee extension exercises. Anticipate discharge to inpatient or outpatient rehabilitation.

## 2013-12-31 ENCOUNTER — Encounter (HOSPITAL_COMMUNITY): Payer: Self-pay | Admitting: Orthopedic Surgery

## 2013-12-31 LAB — GLUCOSE, CAPILLARY: Glucose-Capillary: 293 mg/dL — ABNORMAL HIGH (ref 70–99)

## 2013-12-31 LAB — BASIC METABOLIC PANEL
BUN: 20 mg/dL (ref 6–23)
CHLORIDE: 100 meq/L (ref 96–112)
CO2: 23 mEq/L (ref 19–32)
CREATININE: 0.79 mg/dL (ref 0.50–1.35)
Calcium: 9.3 mg/dL (ref 8.4–10.5)
GLUCOSE: 102 mg/dL — AB (ref 70–99)
POTASSIUM: 4.3 meq/L (ref 3.7–5.3)
Sodium: 137 mEq/L (ref 137–147)

## 2013-12-31 LAB — CBC
HEMATOCRIT: 35.8 % — AB (ref 39.0–52.0)
HEMOGLOBIN: 11.9 g/dL — AB (ref 13.0–17.0)
MCH: 32 pg (ref 26.0–34.0)
MCHC: 33.2 g/dL (ref 30.0–36.0)
MCV: 96.2 fL (ref 78.0–100.0)
Platelets: 398 10*3/uL (ref 150–400)
RBC: 3.72 MIL/uL — ABNORMAL LOW (ref 4.22–5.81)
RDW: 15.4 % (ref 11.5–15.5)
WBC: 10.2 10*3/uL (ref 4.0–10.5)

## 2013-12-31 NOTE — Progress Notes (Signed)
Rehab Admissions Coordinator Note:  Patient was screened by Clois DupesBarbara Godwin Presten Joost for appropriateness for an Inpatient Acute Rehab Consult per RN CM request. At this time, we are recommending Skilled Nursing Facility.  Foye SpurlingBarbara Godwin [facility] 12/31/2013, 10:09 AM  I can be reached at [phone].

## 2013-12-31 NOTE — Anesthesia Postprocedure Evaluation (Signed)
  Anesthesia Post-op Note  Patient: Ronald [name]  Procedure(s) Performed: Procedure(s): LEFT BELOW KNEE AMPUTATION (Left)  Patient Location: PACU  Anesthesia Type:General  Level of Consciousness: awake, alert , oriented and patient cooperative  Airway and Oxygen Therapy: Patient Spontanous Breathing  Post-op Pain: mild  Post-op Assessment: Post-op Vital signs reviewed, Patient's Cardiovascular Status Stable, Respiratory Function Stable, Patent Airway, No signs of Nausea or vomiting and Pain level controlled  Post-op Vital Signs: stable  Last Vitals:  Filed Vitals:   12/31/13 0532  BP: 129/73  Pulse: 94  Temp: 37.2 C  Resp: 18    Complications: No apparent anesthesia complications

## 2013-12-31 NOTE — Evaluation (Signed)
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    Clinical Impression   Pt. Is motivated to increase I and safety with ADLs and mobility. Pt wants to be Mod I prior to d/c home with wife. Pt. Will need further instruction on AE and DME options.      Follow Up Recommendations  SNF    Equipment Recommendations   (TBD at d/c location)    Recommendations for Other Services       Precautions / Restrictions Precautions Precautions: Fall Restrictions Weight Bearing Restrictions: Yes Other Position/Activity Restrictions:  (NWB on L LE)      Mobility Bed Mobility               General bed mobility comments: in recliner on arrival  Transfers Overall transfer level: Needs assistance     Sit to Stand: Supervision         General transfer comment: cues for hand placement and safety    Balance                                            ADL Overall ADL's : Needs assistance/impaired Eating/Feeding: Independent   Grooming: Wash/dry hands;Wash/dry face;Brushing hair;Set up;Sitting   Upper Body Bathing: Set up;Sitting   Lower Body Bathing: Minimal assistance;Sit to/from stand   Upper Body Dressing : Set up;Sitting   Lower Body Dressing: Moderate assistance;Sit to/from stand   Toilet Transfer: Minimal assistance;BSC   Toileting- Clothing Manipulation and Hygiene: Maximal assistance;Sit to/from stand         General ADL Comments:  (Pt. will need further OT to maximize pt. I and safety.)     Vision                     Perception     Praxis      Pertinent Vitals/Pain 4/10 for pain. Pt. States he had pain meds an hour ago.     Hand Dominance     Extremity/Trunk Assessment Upper Extremity Assessment Upper Extremity Assessment: Overall WFL for tasks assessed            Communication Communication Communication: No difficulties   Cognition Arousal/Alertness: Awake/alert Behavior During Therapy: WFL for tasks assessed/performed Overall Cognitive Status: Within Functional Limits for tasks assessed                     General Comments       Exercises       Shoulder Instructions      Home Living Family/patient expects to be discharged to:: Skilled nursing facility                                 Additional Comments: PT has 1 story house with ramped entrance. Needs to be fairly independent in order to safely DC home. Can use his dad's WC at DC        Prior Functioning/Environment Level of Independence: Independent with assistive device(s)        Comments: Used cane PTA    OT Diagnosis: Generalized weakness (new onset L BKA)   OT Problem List: Decreased activity tolerance;Impaired balance (sitting and/or standing);Decreased knowledge of use of DME  or AE   OT Treatment/Interventions: Self-care/ADL training;Neuromuscular education;DME and/or AE instruction;Therapeutic activities    OT Goals(Current goals can be found in the care plan section) Acute Rehab OT Goals Patient Stated Goal: to go to SNF for rehab. OT Goal Formulation: With patient Time For Goal Achievement: 01/14/14 Potential to Achieve Goals: Good ADL Goals Pt Will Perform Grooming: with supervision;standing Pt Will Perform Lower Body Bathing: with supervision;sit to/from stand;with adaptive equipment Pt Will Perform Lower Body Dressing: with supervision;sit to/from stand;with adaptive equipment Pt Will Transfer to Toilet: with supervision;bedside commode Pt Will Perform Toileting - Clothing Manipulation and hygiene: with supervision;sit to/from stand  OT Frequency: Min 2X/week   Barriers to D/C: Decreased caregiver support          Co-evaluation              End of Session    Activity Tolerance: Patient tolerated treatment  well Patient left: in chair;with call bell/phone within reach   Time: 1012-1057 OT Time Calculation (min): 45 min Charges:  OT General Charges $OT Visit: 1 Procedure OT Evaluation $Initial OT Evaluation Tier I: 1 Procedure OT Treatments $Self Care/Home Management : 23-37 mins G-Codes:    Waldemar DickensStephanie A Kaityln Kallstrom 12/31/2013, 10:55 AM

## 2013-12-31 NOTE — Progress Notes (Signed)
[facility]   FL2 is on the chart.    Sabino NiemannAmy Kenia Teagarden, MSW, [employer]CSWA [phone]

## 2013-12-31 NOTE — Progress Notes (Signed)
Ronald PilgrimJacob' s [name] is awaiting DSS to call them back to verify benefits. CSW has called to expedite the process,  Ronald Niemannmy Darcel [name], MSW, [employer]CSWA [phone]

## 2013-12-31 NOTE — Clinical Social Work Psychosocial (Signed)
Clinical Social Work Department  BRIEF PSYCHOSOCIAL ASSESSMENT  Patient:Ronald [name] Account Number: [id number]   Admit date: 12/25/13 Clinical Social Worker Rhea Pink, MSW Date/Time: 12/31/2013 10:30 AM Referred by: Physician Date Referred:  Referred for   SNF Placement   Other Referral:  Interview type: Patient  Other interview type: PSYCHOSOCIAL DATA  Living Status: Alone Admitted from facility:  Level of care:  Primary support name:  Ronald [name] Primary support relationship to patient: Brother Degree of support available:  Fair- according to the patient  CURRENT CONCERNS  Current Concerns   Post-Acute Placement   Other Concerns:  SOCIAL WORK ASSESSMENT / PLAN  CSW met with pt  At bedside to offer support and discuss placement. Patient reported that he currently lives at home alone and needs to go to SNF until he acn be independent. CSW explained that patient may have to go to a different county due to his insurance. Patient reported that he is [place] with this plan. . re: PT recommendation for SNF.   Pt lives alone  CSW explained placement process and answered questions.   Pt reports no preference at this time  CSW completed FL2 and initiated SNF search.     Assessment/plan status: Information/Referral to [employer]  Other assessment/ plan:  Information/referral to community resources:  SNF   PTAR  PATIENT'S/FAMILY'S RESPONSE TO PLAN OF CARE:  Pt  reports he is agreeable to ST SNF in order to increase strength and independence with mobility prior to returning home  Pt verbalized understanding of placement process and was appreciative of [facility].   Rhea Pink, MSW, [place]

## 2013-12-31 NOTE — Progress Notes (Signed)
Patient ID: Ronald ManuelNebraska Codd, male   DOB: Mar 23, 1956, 58 y.o.   MRN: [id number] Patient comfortable this morning. Dressing clean and dry. Anticipate patient can be discharged to skilled nursing at this time. Patient will need FL-2 on the chart.

## 2013-12-31 NOTE — Clinical Social Work Placement (Addendum)
Clinical Social Work Department  CLINICAL SOCIAL WORK PLACEMENT NOTE    Patient: Ronald [name]  Account Number: [id number]  Admit date:12/31/2013 10:30 AM Clinical Social Worker: Sabino NiemannAmy Roann Merk LCSWA Date/time: 12/31/2013 10:30 AM  Clinical Social Work is seeking post-discharge placement for this patient at the following level of care: SKILLED NURSING (*CSW will update this form in Epic as items are completed)  12/31/2013 Patient/family provided with [facility] Department of Clinical Social Work's list of facilities offering this level of care within the geographic area requested by the patient (or if unable, by the patient's family).  12/31/2013 Patient/family informed of their freedom to choose among providers that offer the needed level of care, that participate in Medicare, Medicaid or managed care program needed by the patient, have an available bed and are willing to accept the patient.  12/31/2013 Patient/family informed of [facility]' ownership interest in [facility], as well as of the fact that they are under no obligation to receive care at this facility.  PASARR submitted to EDS on Pre-existing PASARR number received from EDS on   FL2 transmitted to all facilities in geographic area requested by pt/family on 12/31/2013 FL2 transmitted to all facilities within larger geographic area on  Patient informed that his/her managed care company has contracts with or will negotiate with certain facilities, including the following:  Patient/family informed of bed offers received: 12/31/13 [facility] Physician recommends and patient chooses bed at  Patient to be transferred to on 01/01/2014 Patient to be transferred to facility by [facility] The following physician request were entered in Epic:  Additional Comments:

## 2013-12-31 NOTE — Progress Notes (Signed)
UR Completed Neola Worrall Graves-Bigelow, RN,BSN [phone]  

## 2013-12-31 NOTE — Progress Notes (Addendum)
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     PT Comments    Pt with excellent mobility progression today with cues for safety and sequence with gait. Pt educated for positioning and HEP for amputee and progression to point of prosthesis. Will continue to follow and educated for need for supervision for mobility acutely. Pt without assist or supervision at home and prefers ST-SNF to reach mod I prior to DC home for safety and stability.  Follow Up Recommendations  Supervision for mobility/OOB;SNF     Equipment Recommendations  Rolling walker with 5" wheels    Recommendations for Other Services       Precautions / Restrictions Precautions Precautions: Fall    Mobility  Bed Mobility               General bed mobility comments: in recliner on arrival  Transfers Overall transfer level: Needs assistance     Sit to Stand: Supervision         General transfer comment: cues for hand placement and safety  Ambulation/Gait Ambulation/Gait assistance: Min guard Ambulation Distance (Feet): 75 Feet Assistive device: Rolling walker (2 wheeled) Gait Pattern/deviations: Step-to pattern   Gait velocity interpretation: Below normal speed for age/gender General Gait Details: cues for posture and position in RW pt with tendency to hop too close to [employer]W   Stairs            Wheelchair Mobility    Modified Rankin (Stroke Patients Only)       Balance                                    Cognition Arousal/Alertness: Awake/alert Behavior During Therapy: WFL for tasks assessed/performed Overall Cognitive Status: Within Functional Limits for tasks assessed                      Exercises Amputee Exercises Hip Extension: AROM;Left;15 reps;Seated Hip Flexion/Marching: AROM;Seated;Left;15  reps Knee Extension: AROM;Seated;15 reps;Left    General Comments        Pertinent Vitals/Pain 4/10 pain LLE, repositioned    Home Living                      Prior Function            PT Goals (current goals can now be found in the care plan section) Progress towards PT goals: Progressing toward goals    Frequency  Min 3X/week    PT Plan Discharge plan needs to be updated;Current plan remains appropriate    Co-evaluation             End of Session Equipment Utilized During Treatment: Gait belt Activity Tolerance: Patient tolerated treatment well Patient left: in chair;with chair alarm set     Time: [phone] PT Time Calculation (min): 24 min  Charges:  $Gait Training: 8-22 mins $Therapeutic Exercise: 8-22 mins                    G Codes:      Ronald [name] 12/31/2013, 9:15 AM Delaney MeigsMaija Tabor Verdie Barrows, PT [phone]

## 2014-01-01 LAB — BASIC METABOLIC PANEL
BUN: 24 mg/dL — ABNORMAL HIGH (ref 6–23)
CO2: 24 mEq/L (ref 19–32)
Calcium: 9.7 mg/dL (ref 8.4–10.5)
Chloride: 105 mEq/L (ref 96–112)
Creatinine, Ser: 0.72 mg/dL (ref 0.50–1.35)
Glucose, Bld: 110 mg/dL — ABNORMAL HIGH (ref 70–99)
POTASSIUM: 4.6 meq/L (ref 3.7–5.3)
Sodium: 141 mEq/L (ref 137–147)

## 2014-01-01 LAB — CBC
HEMATOCRIT: 36.2 % — AB (ref 39.0–52.0)
Hemoglobin: 12.2 g/dL — ABNORMAL LOW (ref 13.0–17.0)
MCH: 32.2 pg (ref 26.0–34.0)
MCHC: 33.7 g/dL (ref 30.0–36.0)
MCV: 95.5 fL (ref 78.0–100.0)
Platelets: 427 10*3/uL — ABNORMAL HIGH (ref 150–400)
RBC: 3.79 MIL/uL — ABNORMAL LOW (ref 4.22–5.81)
RDW: 15.3 % (ref 11.5–15.5)
WBC: 10.9 10*3/uL — ABNORMAL HIGH (ref 4.0–10.5)

## 2014-01-01 MED ORDER — OXYCODONE-ACETAMINOPHEN 5-325 MG PO TABS: 1.0000 | ORAL_TABLET | Freq: Four times a day (QID) | ORAL | Status: DC | PRN

## 2014-01-01 NOTE — Progress Notes (Signed)
Clinical social worker assisted with patient discharge to [facility].  CSW addressed all family questions and concerns. CSW copied chart and added all important documents. CSW also set up patient transportation with [facility]. Clinical Social Worker will sign off for now as social work intervention is no longer needed.   Loreto Loescher, MSW, LCSWA [phone] 

## 2014-01-01 NOTE — Progress Notes (Signed)
Patient ID: Ronald [name], male   DOB: [birth year], 57 y.o.   MRN: [id number] Orders and discharge summary written for discharge to skilled nursing facility today.

## 2014-01-01 NOTE — Discharge Instructions (Signed)
Amputation Many new amputations occur each year. The most common causes of amputation of the lower extremity (the hip down) are:  Disease.  Injury caused in an accidents or wars (trauma).  Birth defects.  Lumps (tumors) that are cancer. Upper extremity amputation is usually the result of trauma or birth defect, with disease being a less common cause. COMMON PROBLEMS After an amputation a number of issues need to be considered. Getting around and self-care are early problems that must be dealt with. A complete rehabilitation program will help the amputee recover mobility. A team approach of caregivers helps the most. Caregivers that can provide a well rounded program include:   Physicians.  Therapists.  Nurses.  Social workers.  Psychologists. Usually there are problems with body image and coping with lifestyle changes. A grieving period similar to dealing with a death in the family is common after an amputation. Talking to a trained professional with experience in treating people with similar problems can be very helpful. When returning to a previous lifestyle, questions about sexuality can arise. Many of these uncertainties are normal. These can be discussed with your psychologist or rehabilitation specialist. REHABILITATION AND RETURN TO WORK AND ACTIVITIES Returning to recreational activities and employment are part of recovery. Many times, changes to recreation equipment can allow return to a sport or hobby. A device that substitutes the missing part of the body is called a prosthetic. Many prosthetic manufacturers produce components designed for sports. Be sure to discuss all of your leisure interests with your prosthetist. This is the person who helps provide you with custom made replacement limbs. Your physician will also help to select a prosthetic that will meet your needs. Employers will vary in their willingness to change a work environment in order to help people with  disabilities. Your therapists can perform job site evaluations. Your therapist can then make recommendations to help with your work area. Some amputees will not be able to return to previous jobs. Your local Office of Vocational Rehabilitation can assist you in job retraining.  Once you are past the initial rehabilitation stage you will have ongoing contact with caregivers and a prosthetist. You need to work closely with them in making decisions about your prosthetic device. PROGNOSIS  Amputation should not end your joy of life. There are people with limb loss in nearly all walks of life. They are in a wide variety of professions. They participate in nearly all sports. Ask your caregivers about support groups and sports organizations in your area. They can help you with referral to organizations that will be helpful to you. Document Released: 05/22/2002 Document Revised: 11/22/2011 Document Reviewed: 07/17/2007 [facility] Patient Information 2014 [place], [place].   Phantom Limb Pain Phantom limb pain occurs in an arm or leg following an amputation. It is pain in an extremity that no longer exists. This pain varies with different patients. Different activities may cause the pain. Some people with an amputated limb experience the opposite of phantom pain, which is phantom pleasure.  The trouble may start in a part of the brain known as the sensory cortex. The sensory cortex is the portion of your brain that processes sensations from the rest of your body. It is hypothesized that when a body part is lost, the corresponding part of the brain is not able to handle the loss and rewires its circuitry to make up for the signals it no longer receives from the missing extremity. The exact mechanism of how phantom limb pain occurs is  not known. The severity of pain seems to be correlated with personal problems such as stress and attitude. It also seems to correlate with the amount of pain a person had before the  operation. CAUSES   Damaged nerve endings.  Scar tissue at the amputation site. TREATMENT  Phantom limb pain can be severe and debilitating. Most cases of phantom limb pain only last briefly. There are a number of different therapies and medications that may give relief. Keep working with your health care provider until relief is obtained. Some treatments that may be helpful include:  Hypnosis and mental imagery. Their techniques can give patients the needed impetus to recognize their ability to regain control.  Biofeedback.  Relaxation techniques. They are related to hypnosis techniques and use the mind and body to control pain.  Acupuncture.  Massage.  Exercise.  Antidepressant medicine.  Anticonvulsant medicine.  Narcotics or pain medicines. SEEK MEDICAL CARE IF: Pain is not relieved or increases. Document Released: 11/20/2002 Document Revised: 05/02/2013 Document Reviewed: 01/30/2013 [facility] Patient Information 2014 [place], [place].   Cardiac Diet This diet can help prevent heart disease and stroke. Many factors influence your heart health, including eating and exercise habits. Coronary risk rises a lot with abnormal blood fat (lipid) levels. Cardiac meal planning includes limiting unhealthy fats, increasing healthy fats, and making other small dietary changes. General guidelines are as follows:  Adjust calorie intake to reach and maintain desirable body weight.  Limit total fat intake to less than 30% of total calories. Saturated fat should be less than 7% of calories.  Saturated fats are found in animal products and in some vegetable products. Saturated vegetable fats are found in coconut oil, cocoa butter, palm oil, and palm kernel oil. Read labels carefully to avoid these products as much as possible. Use butter in moderation. Choose tub margarines and oils that have 2 grams of fat or less. Good cooking oils are canola and olive oils.  Practice low-fat cooking  techniques. Do not fry food. Instead, broil, bake, boil, steam, grill, roast on a rack, stir-fry, or microwave it. Other fat reducing suggestions include:  Remove the skin from poultry.  Remove all visible fat from meats.  Skim the fat off stews, soups, and gravies before serving them.  Steam vegetables in water or broth instead of sauting them in fat.  Avoid foods with trans fat (or hydrogenated oils), such as commercially fried foods and commercially baked goods. Commercial shortening and deep-frying fats will contain trans fat.  Increase intake of fruits, vegetables, whole grains, and legumes to replace foods high in fat.  Increase consumption of nuts, legumes, and seeds to at least 4 servings weekly. One serving of a legume equals  cup, and 1 serving of nuts or seeds equals  cup.  Choose whole grains more often. Have 3 servings per day (a serving is 1 ounce [oz]).  Eat 4 to 5 servings of vegetables per day. A serving of vegetables is 1 cup of raw leafy vegetables;  cup of raw or cooked cut-up vegetables;  cup of vegetable juice.  Eat 4 to 5 servings of fruit per day. A serving of fruit is 1 medium whole fruit;  cup of dried fruit;  cup of fresh, frozen, or canned fruit;  cup of 100% fruit juice.  Increase your intake of dietary fiber to 20 to 30 grams per day. Insoluble fiber may help lower your risk of heart disease and may help curb your appetite.  Soluble fiber binds cholesterol to  be removed from the blood. Foods high in soluble fiber are dried beans, citrus fruits, oats, apples, bananas, broccoli, Brussels sprouts, and eggplant.  Try to include foods fortified with plant sterols or stanols, such as yogurt, breads, juices, or margarines. Choose several fortified foods to achieve a daily intake of 2 to 3 grams of plant sterols or stanols.  Foods with omega-3 fats can help reduce your risk of heart disease. Aim to have a 3.5 oz portion of fatty fish twice per week, such as  salmon, mackerel, albacore tuna, sardines, lake trout, or herring. If you wish to take a fish oil supplement, choose one that contains 1 gram of both DHA and EPA.  Limit processed meats to 2 servings (3 oz portion) weekly.  Limit the sodium in your diet to 1500 milligrams (mg) per day. If you have high blood pressure, talk to a registered dietitian about a DASH (Dietary Approaches to Stop Hypertension) eating plan.  Limit sweets and beverages with added sugar, such as soda, to no more than 5 servings per week. One serving is:   1 tablespoon sugar.  1 tablespoon jelly or jam.   cup sorbet.  1 cup lemonade.   cup regular soda. CHOOSING FOODS Starches  Allowed: Breads: All kinds (wheat, rye, raisin, white, oatmeal, [place], [place], and English muffin bread). Low-fat rolls: English muffins, frankfurter and hamburger buns, bagels, pita bread, tortillas (not fried). Pancakes, waffles, biscuits, and muffins made with recommended oil.  Avoid: Products made with saturated or trans fats, oils, or whole milk products. Butter rolls, cheese breads, croissants. Commercial doughnuts, muffins, sweet rolls, biscuits, waffles, pancakes, store-bought mixes. Crackers  Allowed: Low-fat crackers and snacks: Animal, graham, rye, saltine (with recommended oil, no lard), oyster, and matzo crackers. Bread sticks, melba toast, rusks, flatbread, pretzels, and light popcorn.  Avoid: High-fat crackers: cheese crackers, butter crackers, and those made with coconut, palm oil, or trans fat (hydrogenated oils). Buttered popcorn. Cereals  Allowed: Hot or cold whole-grain cereals.  Avoid: Cereals containing coconut, hydrogenated vegetable fat, or animal fat. Potatoes / Pasta / Rice  Allowed: All kinds of potatoes, rice, and pasta (such as macaroni, spaghetti, and noodles).  Avoid: Pasta or rice prepared with cream sauce or high-fat cheese. Chow mein noodles, [place] fries. Vegetables  Allowed: All vegetables  and vegetable juices.  Avoid: Fried vegetables. Vegetables in cream, butter, or high-fat cheese sauces. Limit coconut. Fruit in cream or custard. Protein  Allowed: Limit your intake of meat, seafood, and poultry to no more than 6 oz (cooked weight) per day. All lean, well-trimmed beef, veal, pork, and lamb. All chicken and [place] without skin. All fish and shellfish. Wild game: wild duck, rabbit, pheasant, and venison. Egg whites or low-cholesterol egg substitutes may be used as desired. Meatless dishes: recipes with dried beans, peas, lentils, and tofu (soybean curd). Seeds and nuts: all seeds and most nuts.  Avoid: Prime grade and other heavily marbled and fatty meats, such as short ribs, spare ribs, rib eye roast or steak, frankfurters, sausage, bacon, and high-fat luncheon meats, mutton. Caviar. Commercially fried fish. Domestic duck, goose, venison sausage. Organ meats: liver, gizzard, heart, chitterlings, brains, kidney, sweetbreads. Dairy  Allowed: Low-fat cheeses: nonfat or low-fat cottage cheese (1% or 2% fat), cheeses made with part skim milk, such as mozzarella, farmers, string, or ricotta. (Cheeses should be labeled no more than 2 to 6 grams fat per oz.). Skim (or 1%) milk: liquid, powdered, or evaporated. Buttermilk made with low-fat milk. Drinks made with skim  or low-fat milk or cocoa. Chocolate milk or cocoa made with skim or low-fat (1%) milk. Nonfat or low-fat yogurt.  Avoid: Whole milk cheeses, including colby, cheddar, mu[place], [address] Jack, [place]i, [place] CenterBrie, [place]embert, [address]meric[address], Swiss, and blue. Creamed cottage cheese, cream cheese. Whole milk and whole milk products, including buttermilk or yogurt made from whole milk, drinks made from whole milk. Condensed milk, evaporated whole milk, and 2% milk. Soups and Combination Foods  Allowed: Low-fat low-sodium soups: broth, dehydrated soups, homemade broth, soups with the fat removed, homemade cream soups made with skim or low-fat milk.  Low-fat spaghetti, lasagna, chili, and Spanish rice if low-fat ingredients and low-fat cooking techniques are used.  Avoid: Cream soups made with whole milk, cream, or high-fat cheese. All other soups. Desserts and Sweets  Allowed: Sherbet, fruit ices, gelatins, meringues, and angel food cake. Homemade desserts with recommended fats, oils, and milk products. Jam, jelly, honey, marmalade, sugars, and syrups. Pure sugar candy, such as gum drops, hard candy, jelly beans, marshmallows, mints, and small amounts of dark chocolate.  Avoid: Commercially prepared cakes, pies, cookies, frosting, pudding, or mixes for these products. Desserts containing whole milk products, chocolate, coconut, lard, palm oil, or palm kernel oil. Ice cream or ice cream drinks. Candy that contains chocolate, coconut, butter, hydrogenated fat, or unknown ingredients. Buttered syrups. Fats and Oils  Allowed: Vegetable oils: safflower, sunflower, corn, soybean, cottonseed, sesame, canola, olive, or peanut. Non-hydrogenated margarines. Salad dressing or mayonnaise: homemade or commercial, made with a recommended oil. Low or nonfat salad dressing or mayonnaise.  Limit added fats and oils to 6 to 8 tsp per day (includes fats used in cooking, baking, salads, and spreads on bread). Remember to count the "hidden fats" in foods.  Avoid: Solid fats and shortenings: butter, lard, salt pork, bacon drippings. Gravy containing meat fat, shortening, or suet. Cocoa butter, coconut. Coconut oil, palm oil, palm kernel oil, or hydrogenated oils: these ingredients are often used in bakery products, nondairy creamers, whipped toppings, candy, and commercially fried foods. Read labels carefully. Salad dressings made of unknown oils, sour cream, or cheese, such as blue cheese and Roquefort. Cream, all kinds: half-and-half, light, heavy, or whipping. Sour cream or cream cheese (even if "light" or low-fat). Nondairy cream substitutes: coffee creamers and  sour cream substitutes made with palm, palm kernel, hydrogenated oils, or coconut oil. Beverages  Allowed: Coffee (regular or decaffeinated), tea. Diet carbonated beverages, mineral water. Alcohol: Check with your caregiver. Moderation is recommended.  Avoid: Whole milk, regular sodas, and juice drinks with added sugar. Condiments  Allowed: All seasonings and condiments. Cocoa powder. "Cream" sauces made with recommended ingredients.  Avoid: Carob powder made with hydrogenated fats. SAMPLE MENU Breakfast   cup orange juice   cup oatmeal  1 slice toast  1 tsp margarine  1 cup skim milk Lunch  [place]urkey sandwich with 2 oz [place]urkey, 2 slices bread  Lettuce and tomato slices  Fresh fruit  Carrot sticks  Coffee or tea Snack  Fresh fruit or low-fat crackers Dinner  3 oz lean ground beef  1 baked potato  1 tsp margarine   cup asparagus  Lettuce salad  1 tbs non-creamy dressing   cup peach slices  1 cup skim milk Document Released: 06/08/2008 Document Revised: 02/29/2012 Document Reviewed: 11/23/2011 Exit[employer] Patient Information 2014 [place], [place].

## 2014-01-01 NOTE — Discharge Summary (Signed)
Physician Discharge Summary  Patient ID: Ronald [name] MRN: [id number] DOB/AGE: 01/15/1956 58 y.o.  Admit date: 12/25/2013 Discharge date: 01/01/2014  Admission Diagnoses: Gangrene left foot  Discharge Diagnoses:  Principal Problem:   Critical lower limb ischemia Active Problems:   HTN (hypertension), benign   Osteomyelitis of left foot   Discharged Condition: stable  Hospital Course: Patient's hospital course was essentially unremarkable. Patient underwent a transtibial amputation the left. Postoperatively patient required additional care for independent activities of daily living and was discharged to skilled nursing.  Consults: None  Significant Diagnostic Studies: labs: Routine labs  Treatments: surgery: See operative note  Discharge Exam: Blood pressure 132/82, pulse 88, temperature 98.7 F (37.1 C), temperature source Oral, resp. rate 18, height 5\' 2"  (1.575 m), weight 81.6 kg (179 lb 14.3 oz), SpO2 100.00%. Incision/Wound: dressing clean dry and intact  Disposition: 07-Left Against Medical Advice  Discharge Orders   Future Orders Complete By Expires   Change dressing  As directed    Scheduling Instructions:   Change dressing transtibial amputation as needed. Wash the leg was opened water.   Non weight bearing  As directed    Questions:     Laterality:  left   Extremity:  Lower       Medication List    ASK your doctor about these medications       ibuprofen 200 MG tablet  Commonly known as:  ADVIL,MOTRIN  Take 400 mg by mouth every 6 (six) hours as needed for moderate pain.     naproxen sodium 220 MG tablet  Commonly known as:  ANAPROX  Take 220 mg by mouth 2 (two) times daily with a meal.     triamterene-hydrochlorothiazide 37.5-25 MG per tablet  Commonly known as:  MAXZIDE-25  Take 1 tablet by mouth daily.           Follow-up Information   Follow up with Runell GessBERRY,JONATHAN J, MD. (As needed)    Specialty:  Cardiology   Contact information:   [address] [place] [address] [phone]       Follow up with Jalise Zawistowski V, MD In 1 week.   Specialty:  Orthopedic Surgery   Contact information:   [address] [place] [id number] [phone]       Signed: Nadara MustardMarcus V Hancel Ion 01/01/2014, 7:09 AM

## 2014-06-18 ENCOUNTER — Inpatient Hospital Stay (HOSPITAL_COMMUNITY)
Admission: EM | Admit: 2014-06-18 | Discharge: 2014-06-19 | DRG: 084 | Disposition: A | Discharge status: Home / Self Care | Payer: Medicaid Other | Attending: Internal Medicine | Admitting: Internal Medicine

## 2014-06-18 ENCOUNTER — Emergency Department (HOSPITAL_COMMUNITY): Payer: Medicaid Other

## 2014-06-18 ENCOUNTER — Encounter (HOSPITAL_COMMUNITY): Payer: Self-pay | Admitting: Emergency Medicine

## 2014-06-18 DIAGNOSIS — I70229 Atherosclerosis of native arteries of extremities with rest pain, unspecified extremity: Secondary | ICD-10-CM

## 2014-06-18 DIAGNOSIS — F1010 Alcohol abuse, uncomplicated: Secondary | ICD-10-CM | POA: Diagnosis present

## 2014-06-18 DIAGNOSIS — L02612 Cutaneous abscess of left foot: Secondary | ICD-10-CM

## 2014-06-18 DIAGNOSIS — L03116 Cellulitis of left lower limb: Secondary | ICD-10-CM

## 2014-06-18 DIAGNOSIS — M109 Gout, unspecified: Secondary | ICD-10-CM | POA: Diagnosis present

## 2014-06-18 DIAGNOSIS — E876 Hypokalemia: Secondary | ICD-10-CM

## 2014-06-18 DIAGNOSIS — M869 Osteomyelitis, unspecified: Secondary | ICD-10-CM

## 2014-06-18 DIAGNOSIS — Y92008 Other place in unspecified non-institutional (private) residence as the place of occurrence of the external cause: Secondary | ICD-10-CM

## 2014-06-18 DIAGNOSIS — I998 Other disorder of circulatory system: Secondary | ICD-10-CM | POA: Diagnosis present

## 2014-06-18 DIAGNOSIS — I10 Essential (primary) hypertension: Secondary | ICD-10-CM | POA: Diagnosis present

## 2014-06-18 DIAGNOSIS — F17210 Nicotine dependence, cigarettes, uncomplicated: Secondary | ICD-10-CM | POA: Diagnosis present

## 2014-06-18 DIAGNOSIS — S065X9A Traumatic subdural hemorrhage with loss of consciousness of unspecified duration, initial encounter: Principal | ICD-10-CM | POA: Diagnosis present

## 2014-06-18 DIAGNOSIS — Z89512 Acquired absence of left leg below knee: Secondary | ICD-10-CM

## 2014-06-18 DIAGNOSIS — S065XAA Traumatic subdural hemorrhage with loss of consciousness status unknown, initial encounter: Secondary | ICD-10-CM

## 2014-06-18 DIAGNOSIS — S62626A Displaced fracture of medial phalanx of right little finger, initial encounter for closed fracture: Secondary | ICD-10-CM | POA: Diagnosis present

## 2014-06-18 DIAGNOSIS — F10120 Alcohol abuse with intoxication, uncomplicated: Secondary | ICD-10-CM | POA: Diagnosis present
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
## 2014-06-19 ENCOUNTER — Encounter (HOSPITAL_COMMUNITY): Payer: Self-pay | Admitting: Internal Medicine

## 2014-06-19 ENCOUNTER — Observation Stay (HOSPITAL_COMMUNITY): Payer: Medicaid Other

## 2014-06-19 DIAGNOSIS — I998 Other disorder of circulatory system: Secondary | ICD-10-CM | POA: Diagnosis present

## 2014-06-19 DIAGNOSIS — I10 Essential (primary) hypertension: Secondary | ICD-10-CM | POA: Diagnosis present

## 2014-06-19 DIAGNOSIS — E876 Hypokalemia: Secondary | ICD-10-CM

## 2014-06-19 DIAGNOSIS — S065X9A Traumatic subdural hemorrhage with loss of consciousness of unspecified duration, initial encounter: Secondary | ICD-10-CM | POA: Diagnosis present

## 2014-06-19 DIAGNOSIS — S62626A Displaced fracture of medial phalanx of right little finger, initial encounter for closed fracture: Secondary | ICD-10-CM | POA: Diagnosis present

## 2014-06-19 DIAGNOSIS — F1010 Alcohol abuse, uncomplicated: Secondary | ICD-10-CM

## 2014-06-19 DIAGNOSIS — S065XAA Traumatic subdural hemorrhage with loss of consciousness status unknown, initial encounter: Secondary | ICD-10-CM | POA: Diagnosis present

## 2014-06-19 DIAGNOSIS — Y92008 Other place in unspecified non-institutional (private) residence as the place of occurrence of the external cause: Secondary | ICD-10-CM | POA: Diagnosis not present

## 2014-06-19 DIAGNOSIS — F17210 Nicotine dependence, cigarettes, uncomplicated: Secondary | ICD-10-CM | POA: Diagnosis present

## 2014-06-19 DIAGNOSIS — I6200 Nontraumatic subdural hemorrhage, unspecified: Secondary | ICD-10-CM

## 2014-06-19 DIAGNOSIS — Z89512 Acquired absence of left leg below knee: Secondary | ICD-10-CM | POA: Diagnosis not present

## 2014-06-19 DIAGNOSIS — F10120 Alcohol abuse with intoxication, uncomplicated: Secondary | ICD-10-CM | POA: Diagnosis present

## 2014-06-19 DIAGNOSIS — M109 Gout, unspecified: Secondary | ICD-10-CM | POA: Diagnosis present

## 2014-06-19 LAB — CBC WITH DIFFERENTIAL/PLATELET
Basophils Absolute: 0 10*3/uL (ref 0.0–0.1)
Basophils Relative: 0 % (ref 0–1)
Eosinophils Absolute: 0.4 10*3/uL (ref 0.0–0.7)
Eosinophils Relative: 5 % (ref 0–5)
HEMATOCRIT: 44.4 % (ref 39.0–52.0)
HEMOGLOBIN: 15.9 g/dL (ref 13.0–17.0)
Lymphocytes Relative: 46 % (ref 12–46)
Lymphs Abs: 3.4 10*3/uL (ref 0.7–4.0)
MCH: 33.6 pg (ref 26.0–34.0)
MCHC: 35.8 g/dL (ref 30.0–36.0)
MCV: 93.9 fL (ref 78.0–100.0)
MONO ABS: 0.5 10*3/uL (ref 0.1–1.0)
MONOS PCT: 6 % (ref 3–12)
Neutro Abs: 3.2 10*3/uL (ref 1.7–7.7)
Neutrophils Relative %: 43 % (ref 43–77)
Platelets: 283 10*3/uL (ref 150–400)
RBC: 4.73 MIL/uL (ref 4.22–5.81)
RDW: 14.6 % (ref 11.5–15.5)
WBC: 7.5 10*3/uL (ref 4.0–10.5)

## 2014-06-19 LAB — COMPREHENSIVE METABOLIC PANEL
ALBUMIN: 4.1 g/dL (ref 3.5–5.2)
ALK PHOS: 69 U/L (ref 39–117)
ALT: 14 U/L (ref 0–53)
AST: 22 U/L (ref 0–37)
Anion gap: 21 — ABNORMAL HIGH (ref 5–15)
BUN: 7 mg/dL (ref 6–23)
CHLORIDE: 94 meq/L — AB (ref 96–112)
CO2: 22 mEq/L (ref 19–32)
Calcium: 8.9 mg/dL (ref 8.4–10.5)
Creatinine, Ser: 0.73 mg/dL (ref 0.50–1.35)
GFR calc Af Amer: >90 mL/min (ref >90)
GFR calc non Af Amer: >90 mL/min (ref >90)
Glucose, Bld: 126 mg/dL — ABNORMAL HIGH (ref 70–99)
POTASSIUM: 3.2 meq/L — AB (ref 3.7–5.3)
SODIUM: 137 meq/L (ref 137–147)
Total Bilirubin: 0.4 mg/dL (ref 0.3–1.2)
Total Protein: 8.4 g/dL — ABNORMAL HIGH (ref 6.0–8.3)

## 2014-06-19 LAB — BASIC METABOLIC PANEL
Anion gap: 19 — ABNORMAL HIGH (ref 5–15)
BUN: 7 mg/dL (ref 6–23)
CHLORIDE: 91 meq/L — AB (ref 96–112)
CO2: 21 mEq/L (ref 19–32)
CREATININE: 0.59 mg/dL (ref 0.50–1.35)
Calcium: 8.9 mg/dL (ref 8.4–10.5)
GFR calc non Af Amer: >90 mL/min (ref >90)
Glucose, Bld: 108 mg/dL — ABNORMAL HIGH (ref 70–99)
Potassium: 4.9 mEq/L (ref 3.7–5.3)
Sodium: 131 mEq/L — ABNORMAL LOW (ref 137–147)

## 2014-06-19 LAB — CBC
HEMATOCRIT: 44.3 % (ref 39.0–52.0)
Hemoglobin: 16 g/dL (ref 13.0–17.0)
MCH: 33.5 pg (ref 26.0–34.0)
MCHC: 36.1 g/dL — AB (ref 30.0–36.0)
MCV: 92.9 fL (ref 78.0–100.0)
Platelets: 335 10*3/uL (ref 150–400)
RBC: 4.77 MIL/uL (ref 4.22–5.81)
RDW: 14.5 % (ref 11.5–15.5)
WBC: 8.4 10*3/uL (ref 4.0–10.5)

## 2014-06-19 LAB — CK: Total CK: 217 U/L (ref 7–232)

## 2014-06-19 LAB — HEPATIC FUNCTION PANEL
ALBUMIN: 4.4 g/dL (ref 3.5–5.2)
ALT: 17 U/L (ref 0–53)
AST: 37 U/L (ref 0–37)
Alkaline Phosphatase: 64 U/L (ref 39–117)
Total Bilirubin: 0.4 mg/dL (ref 0.3–1.2)
Total Protein: 8.7 g/dL — ABNORMAL HIGH (ref 6.0–8.3)

## 2014-06-19 LAB — PROTIME-INR
INR: 1 (ref 0.00–1.49)
Prothrombin Time: 13.3 seconds (ref 11.6–15.2)

## 2014-06-19 LAB — ETHANOL: ALCOHOL ETHYL (B): 258 mg/dL — AB (ref 0–11)

## 2014-06-19 LAB — TROPONIN I: Troponin I: <0.3 ng/mL (ref <0.30)

## 2014-06-19 MED ORDER — VITAMIN B-1 100 MG PO TABS
100.0000 mg | ORAL_TABLET | Freq: Every day | ORAL | Status: DC
  Administered 2014-06-19: 100 mg via ORAL
  Filled 2014-06-19: qty 1

## 2014-06-19 MED ORDER — LORAZEPAM 1 MG PO TABS: 0.0000 mg | ORAL_TABLET | Freq: Two times a day (BID) | ORAL | Status: DC

## 2014-06-19 MED ORDER — THIAMINE HCL 100 MG PO TABS: 100.0000 mg | ORAL_TABLET | Freq: Every day | ORAL | Status: DC

## 2014-06-19 MED ORDER — ONDANSETRON HCL 4 MG/2ML IJ SOLN: 4.0000 mg | Freq: Four times a day (QID) | INTRAMUSCULAR | Status: DC | PRN

## 2014-06-19 MED ORDER — ADULT MULTIVITAMIN W/MINERALS CH
1.0000 | ORAL_TABLET | Freq: Every day | ORAL | Status: DC
  Administered 2014-06-19: 1 via ORAL
  Filled 2014-06-19: qty 1

## 2014-06-19 MED ORDER — SODIUM CHLORIDE 0.9 % IJ SOLN: 3.0000 mL | Freq: Two times a day (BID) | INTRAMUSCULAR | Status: DC

## 2014-06-19 MED ORDER — ONDANSETRON HCL 4 MG PO TABS: 4.0000 mg | ORAL_TABLET | Freq: Four times a day (QID) | ORAL | Status: DC | PRN

## 2014-06-19 MED ORDER — FOLIC ACID 1 MG PO TABS
1.0000 mg | ORAL_TABLET | Freq: Every day | ORAL | Status: DC
Start: 2014-06-19 — End: 2014-06-19
  Administered 2014-06-19: 1 mg via ORAL
  Filled 2014-06-19: qty 1

## 2014-06-19 MED ORDER — THIAMINE HCL 100 MG/ML IJ SOLN
100.0000 mg | Freq: Every day | INTRAMUSCULAR | Status: DC
  Filled 2014-06-19: qty 1

## 2014-06-19 MED ORDER — LORAZEPAM 1 MG PO TABS: 0.0000 mg | ORAL_TABLET | Freq: Four times a day (QID) | ORAL | Status: DC

## 2014-06-19 MED ORDER — FOLIC ACID 1 MG PO TABS: 1.0000 mg | ORAL_TABLET | Freq: Every day | ORAL | Status: DC

## 2014-06-19 MED ORDER — POTASSIUM CHLORIDE ER 10 MEQ PO TBCR: 10.0000 meq | EXTENDED_RELEASE_TABLET | Freq: Every day | ORAL | Status: DC

## 2014-06-19 MED ORDER — SODIUM CHLORIDE 0.9 % IV SOLN
INTRAVENOUS | Status: DC
  Administered 2014-06-19: 07:00:00 via INTRAVENOUS

## 2014-06-19 MED ORDER — POTASSIUM CHLORIDE CRYS ER 20 MEQ PO TBCR
40.0000 meq | EXTENDED_RELEASE_TABLET | Freq: Once | ORAL | Status: AC
  Administered 2014-06-19: 40 meq via ORAL
  Filled 2014-06-19: qty 2

## 2014-06-19 MED ORDER — LORAZEPAM 2 MG/ML IJ SOLN: 1.0000 mg | Freq: Four times a day (QID) | INTRAMUSCULAR | Status: DC | PRN

## 2014-06-19 MED ORDER — ADULT MULTIVITAMIN W/MINERALS CH: 1.0000 | ORAL_TABLET | Freq: Every day | ORAL | Status: DC

## 2014-06-19 MED ORDER — ACETAMINOPHEN 650 MG RE SUPP
650.0000 mg | Freq: Four times a day (QID) | RECTAL | Status: DC | PRN
Start: 2014-06-19 — End: 2014-06-19

## 2014-06-19 MED ORDER — ACETAMINOPHEN 325 MG PO TABS: 650.0000 mg | ORAL_TABLET | Freq: Four times a day (QID) | ORAL | Status: DC | PRN

## 2014-06-19 MED ORDER — LORAZEPAM 1 MG PO TABS: 1.0000 mg | ORAL_TABLET | Freq: Four times a day (QID) | ORAL | Status: DC | PRN

## 2014-06-19 MED ORDER — HYDRALAZINE HCL 20 MG/ML IJ SOLN: 10.0000 mg | INTRAMUSCULAR | Status: DC | PRN
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
## 2014-06-19 NOTE — Progress Notes (Signed)
Utilization review completed.  

## 2014-06-19 NOTE — Discharge Instructions (Signed)
Subdural Hematoma  A subdural hematoma is a collection of blood between the brain and its tough outermost membrane covering (the dura).  Blood clots that form in this area push down on the brain and cause irritation. A subdural hematoma may cause parts of the brain to stop working and eventually cause death.   CAUSES  A subdural hematoma is caused by bleeding from a ruptured blood vessel (hemorrhage). The bleeding results from trauma to the head, such as from a fall or motor vehicle accident.  There are two types of subdural hemorrhages:  · Acute. This type develops shortly after a serious blow to the head and causes blood to collect very quickly. If not diagnosed and treated promptly, severe brain injury or death can occur.  · Chronic. This is when bleeding develops more slowly, over weeks or months.  RISK FACTORS  People at risk for subdural hematoma include older persons, infants, and alcoholics.  SYMPTOMS  An acute subdural hemorrhage develops over minutes to hours. Symptoms can include:  · Temporary loss of consciousness.  · Weakness of arms or legs on one side of the body.  · Changes in vision or speech.  · A severe headache.  · Seizures.  · Nausea and vomiting.  · Increased sleepiness.  A chronic subdural hemorrhage develops over weeks to months. Symptoms may develop slowly and produce less noticeable problems or changes. Symptoms include:  · A mild headache.  · A change in personality.  · Loss of balance or difficulty walking.  · Weakness, numbness, or tingling in the arms or legs.  · Nausea or vomiting.  · Memory loss.  · Double vision.  · Increased sleepiness.  DIAGNOSIS  Your health care provider will perform a thorough physical and neurological exam. A CT scan or MRI may also be done. If there is blood on the scan, its color will help your health care provider determine how long the hemorrhage has been there.  TREATMENT  If the cause is an acute subdural hemorrhage, immediate treatment is needed. In many  cases an emergency surgery is performed to drain accumulated blood or to remove the blood clot. Sometimes steroid or diuretic medicines or controlled breathing through a ventilator is needed to decrease pressure in the brain. This is especially true if there is any swelling of the brain.  If the cause is a chronic subdural hemorrhage, treatment depends on a variety of factors. Sometimes no treatment is needed. If the subdural hematoma is small and causes minimal or no symptoms, you may be treated with bed rest, medicines, and observation. If the hemorrhage is large or if you have neurological symptoms, an emergency surgery is usually needed to remove the blood clot.  People who develop a subdural hemorrhage are at risk of developing seizures, even after the subdural hematoma has been treated. You may be prescribed an anti-seizure (anticonvulsant) medicine for a year or longer.  HOME CARE INSTRUCTIONS  · Only take medicines as directed by your health care provider.  · Rest if directed by your health care provider.  · Keep all follow-up appointments with your health care provider.  · If you play a contact sport such as football, hockey or soccer and you experienced a significant head injury, allow enough time for healing (up to 15 days) before you start playing again. A repeated injury that occurs during this fragile repair period is likely to result in hemorrhage. This is called the second impact syndrome.  SEEK IMMEDIATE MEDICAL CARE IF:  ·   You fall or experience minor trauma to your head and you are taking blood thinners. If you are on any blood thinners even a very small injury can cause a subdural hematoma. You should not hesitate to seek medical attention regardless of how minor you think your symptoms are.  · You experience a head injury and have:  ¨ Drowsiness or a decrease in alertness.  ¨ Confusion or forgetfulness.  ¨ Slurred speech.  ¨ Irrational or aggressive behavior.  ¨ Numbness or paralysis in any part  of the body.  ¨ A feeling of being sick to your stomach (nauseous) or you throw up (vomit).  ¨ Difficulty walking or poor coordination.  ¨ Double vision.  ¨ Seizures.  ¨ A bleeding disorder.  ¨ A history of heavy alcohol use.  ¨ Clear fluid draining from your nose or ears.  ¨ Personality changes.  ¨ Difficulty thinking.  ¨ Worsening symptoms.  MAKE SURE YOU:  · Understand these instructions.  · Will watch your condition.  · Will get help right away if you are not doing well or get worse.  FOR MORE INFORMATION  National Institute of Neurological Disorders and Stroke: www.ninds.nih.gov  American Association of Neurological Surgeons: www.neurosurgerytoday.org  American Academy of Neurology (AAN): www.aan.com  Brain Injury Association of America: www.biausa.org  Document Released: 07/17/2004 Document Revised: 06/20/2013 Document Reviewed: 03/02/2013  [employer]® Patient Information ©2015 [employer]. This information is not intended to replace advice given to you by your health care provider. Make sure you discuss any questions you have with your health care provider.
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
## 2014-08-22 ENCOUNTER — Encounter (HOSPITAL_COMMUNITY): Payer: Self-pay | Admitting: Surgery

## 2015-08-25 IMAGING — CR DG FOOT COMPLETE 3+V*R*
3 series · 3 of 3 positions shown · non-contrast
Comparison: DG ANKLE COMPLETE*R* dated 04/15/2008; DG FOOT
COMPLETE*R* dated 12/24/2003

CLINICAL DATA: Bilateral foot pain and swelling.

EXAM:
RIGHT FOOT COMPLETE - 3+ VIEW

[x foot ap right]
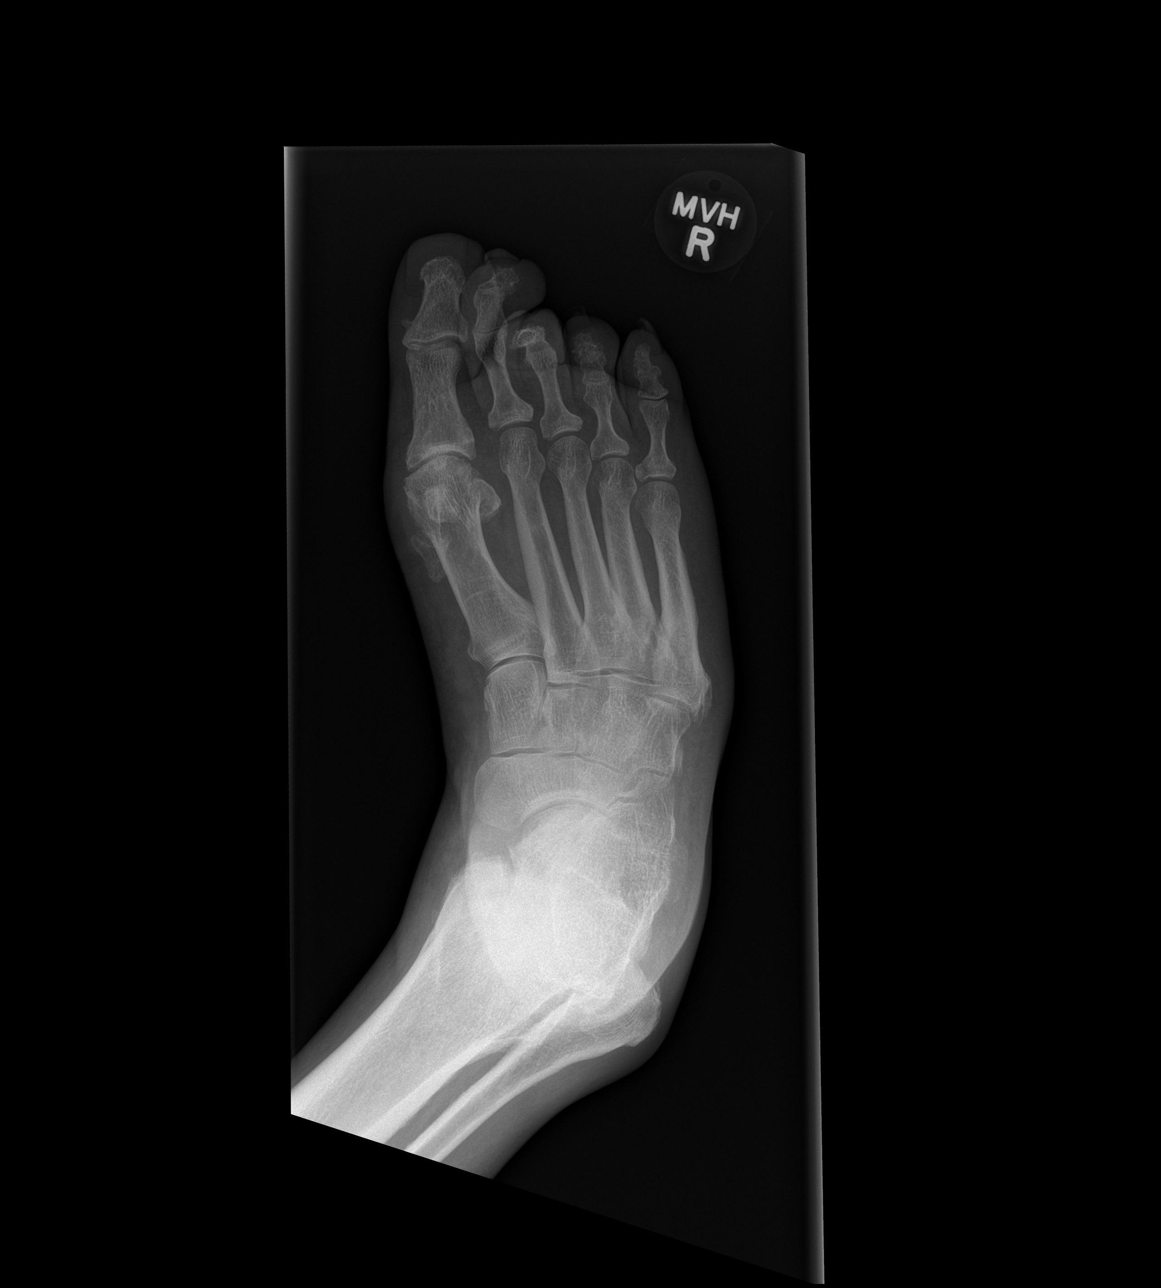

[x foot obl right]
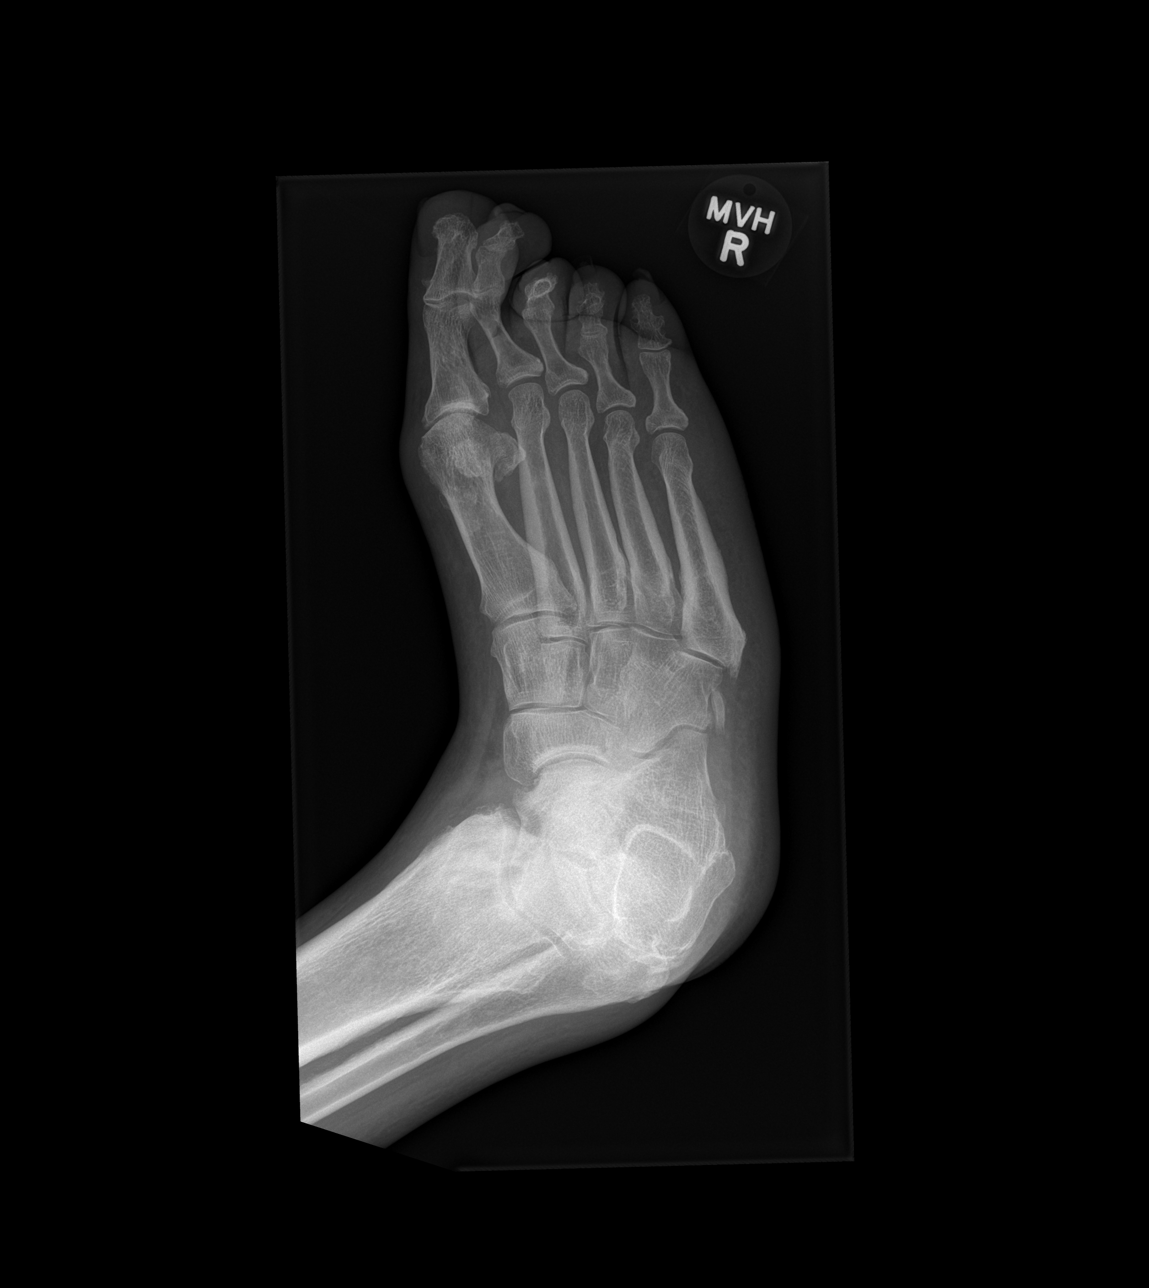

[x foot lat right]
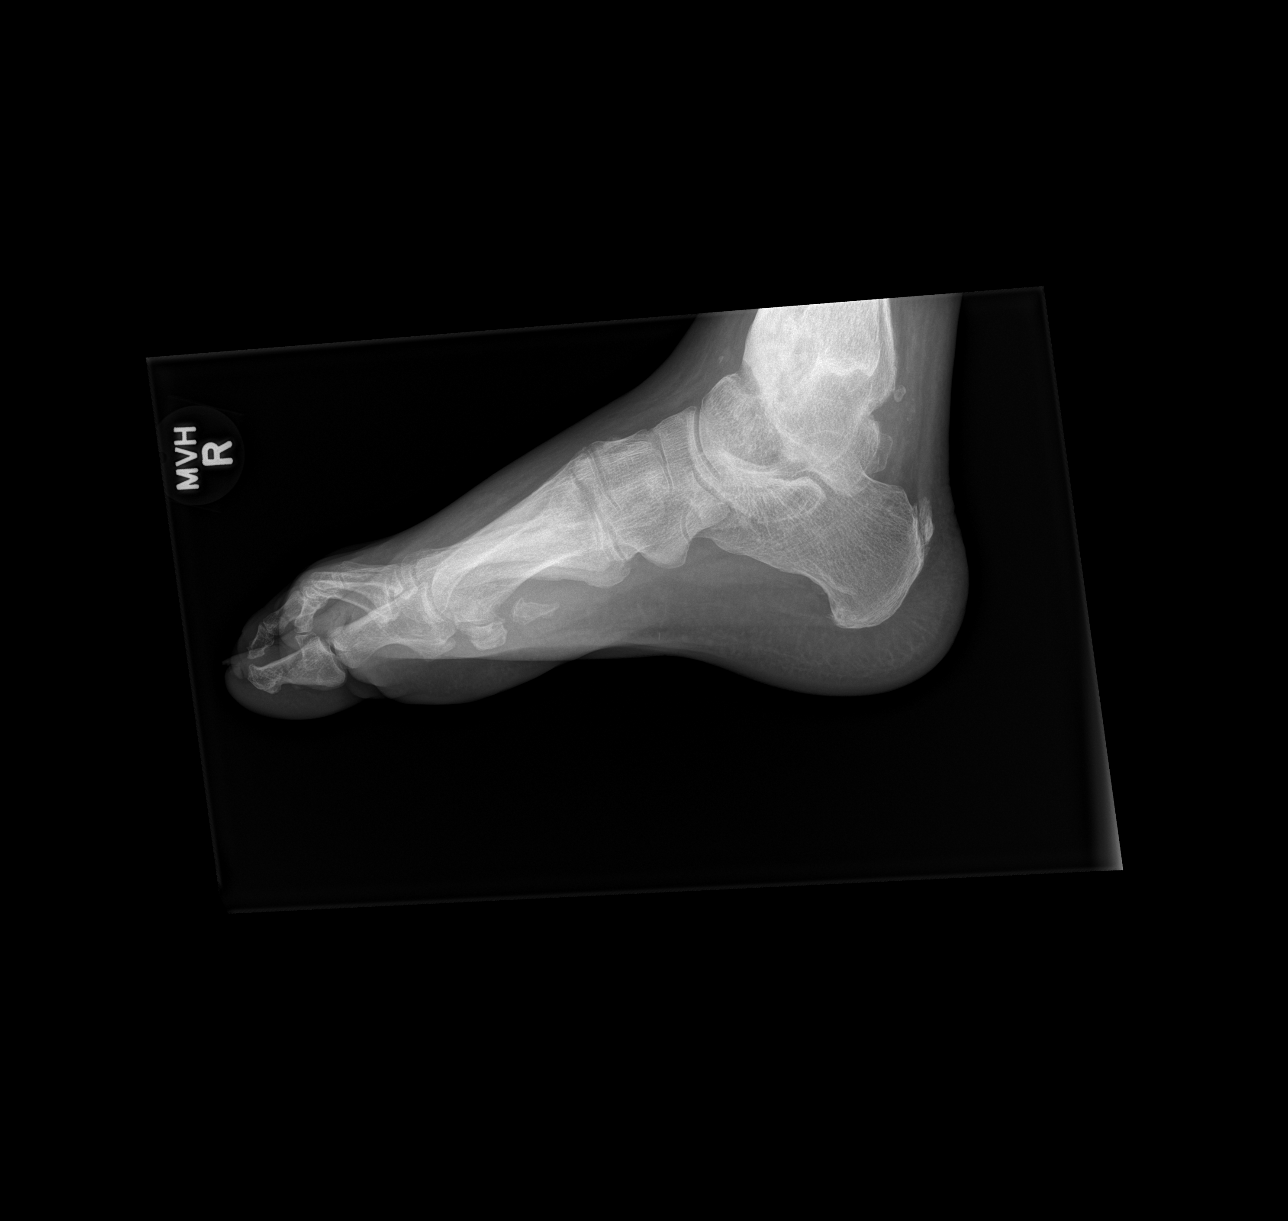

[3 of 3 positions shown; findings below may reference images not displayed]

FINDINGS: The bones of the right foot are osteopenic. There is a bony fragment
along the ventral aspect of the distal half of the shaft of the
first metatarsal. This is separate from the sesamoid bones. No donor
site is demonstrated. There are mild degenerative changes of the
interphalangeal joint. Deformity of the DIP joint of the third toe
is demonstrated. No erosive changes of the right foot similar to
those noted on the left are demonstrated. The bones of the hindfoot
exhibit no acute abnormality. Mild soft tissue swelling diffusely is
present.
IMPRESSION: 1. There are no erosive changes suspicious for osteomyelitis. There
is no soft tissue gas.
2. There is chronic deformity of the DIP joint of the third toe.
There is mild degenerative change of the first metatarsophalangeal
joint.
3. There is nonspecific bony density within the ventral aspect of
the soft tissues adjacent to the shaft of the first metatarsal. It
was present on a study from 1992.

## 2015-10-13 IMAGING — CR DG FOOT COMPLETE 3+V*L*
3 series · 3 of 3 positions shown · non-contrast
Comparison: 11/28/2013

CLINICAL DATA: Infection.  Evaluate for osteomyelitis.

EXAM:
LEFT FOOT - COMPLETE 3+ VIEW

[t foot ap left]
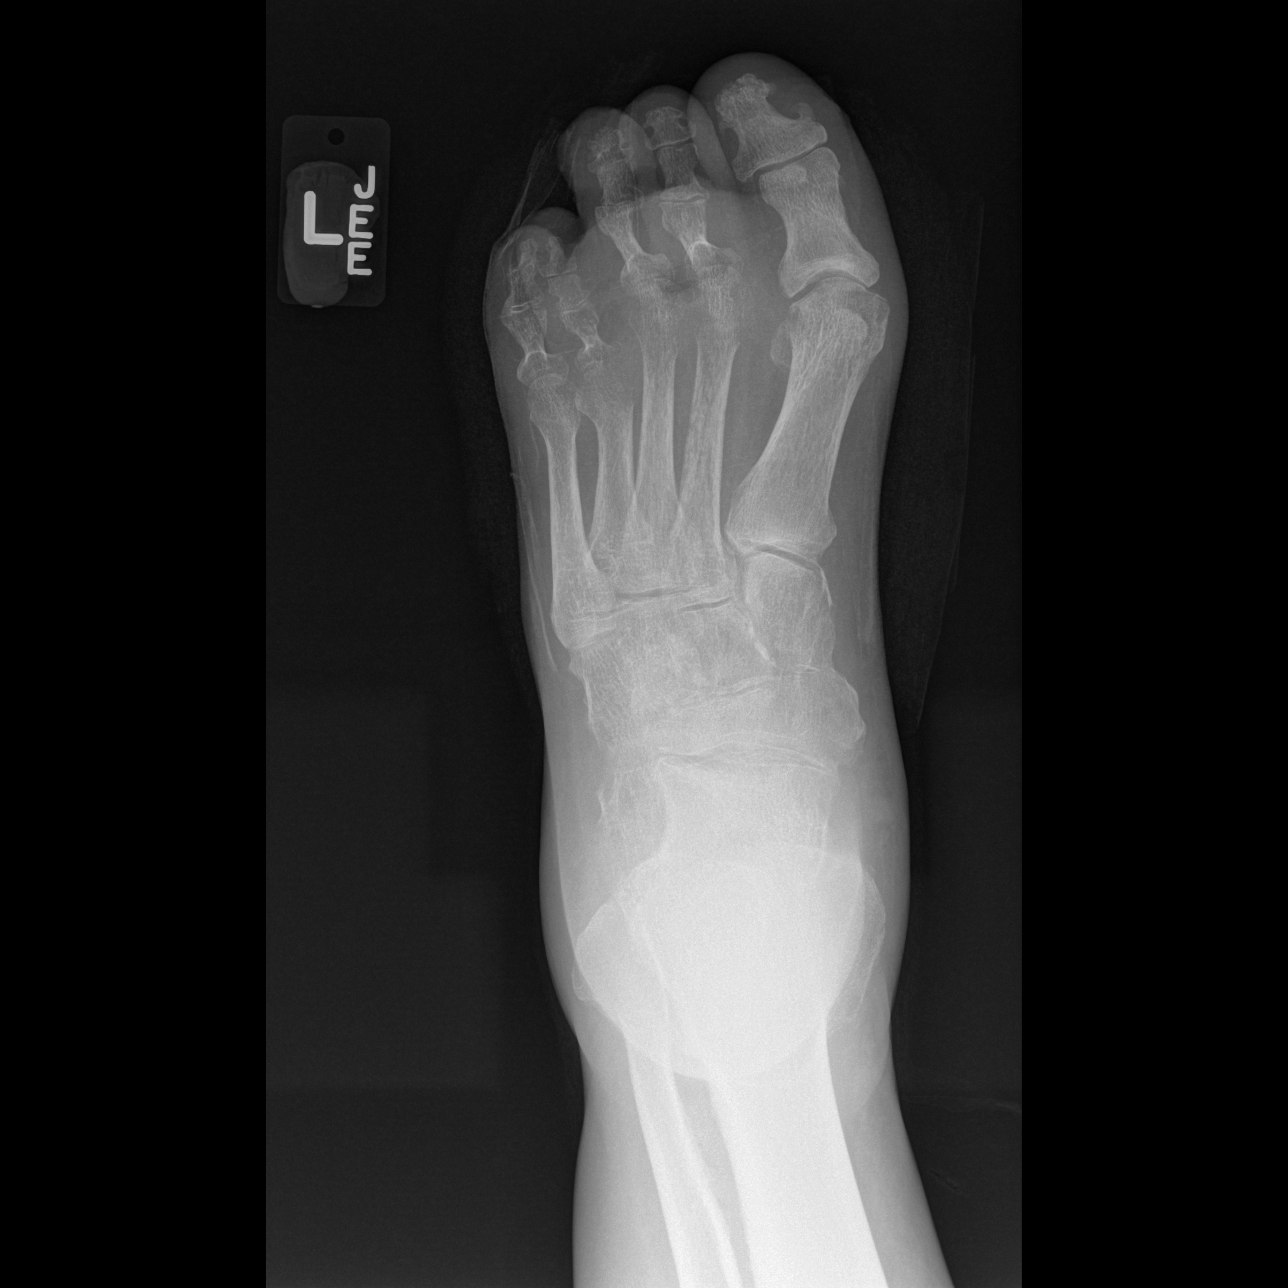

[t foot oblique left]
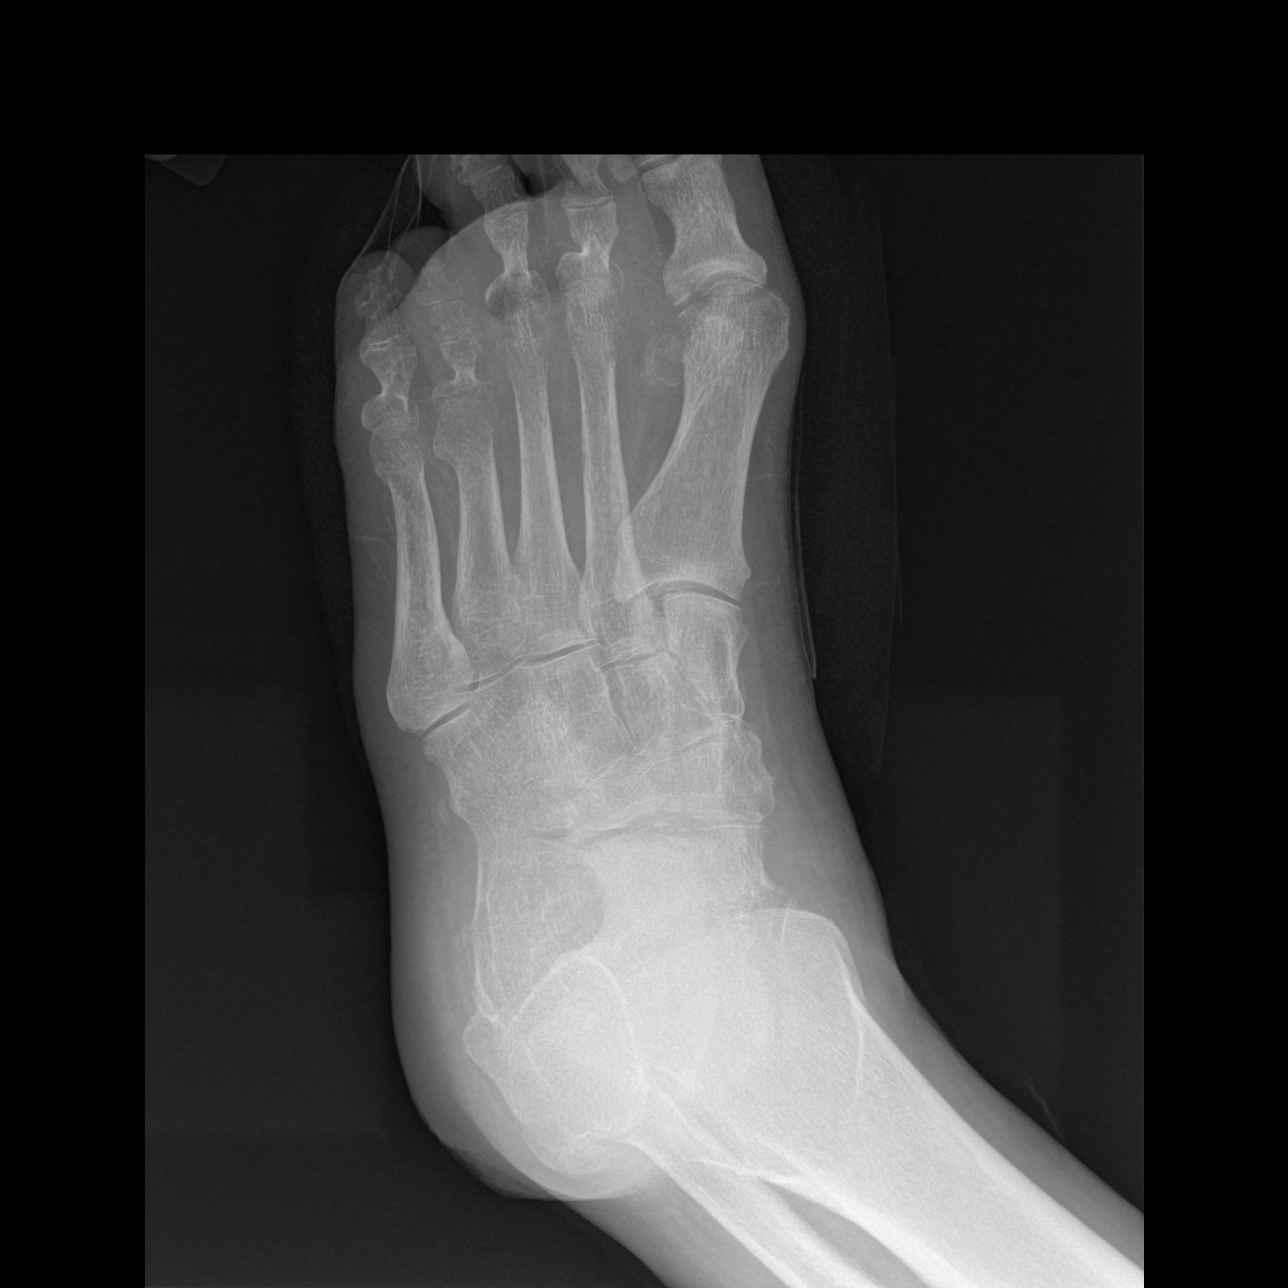

[t foot lat left]
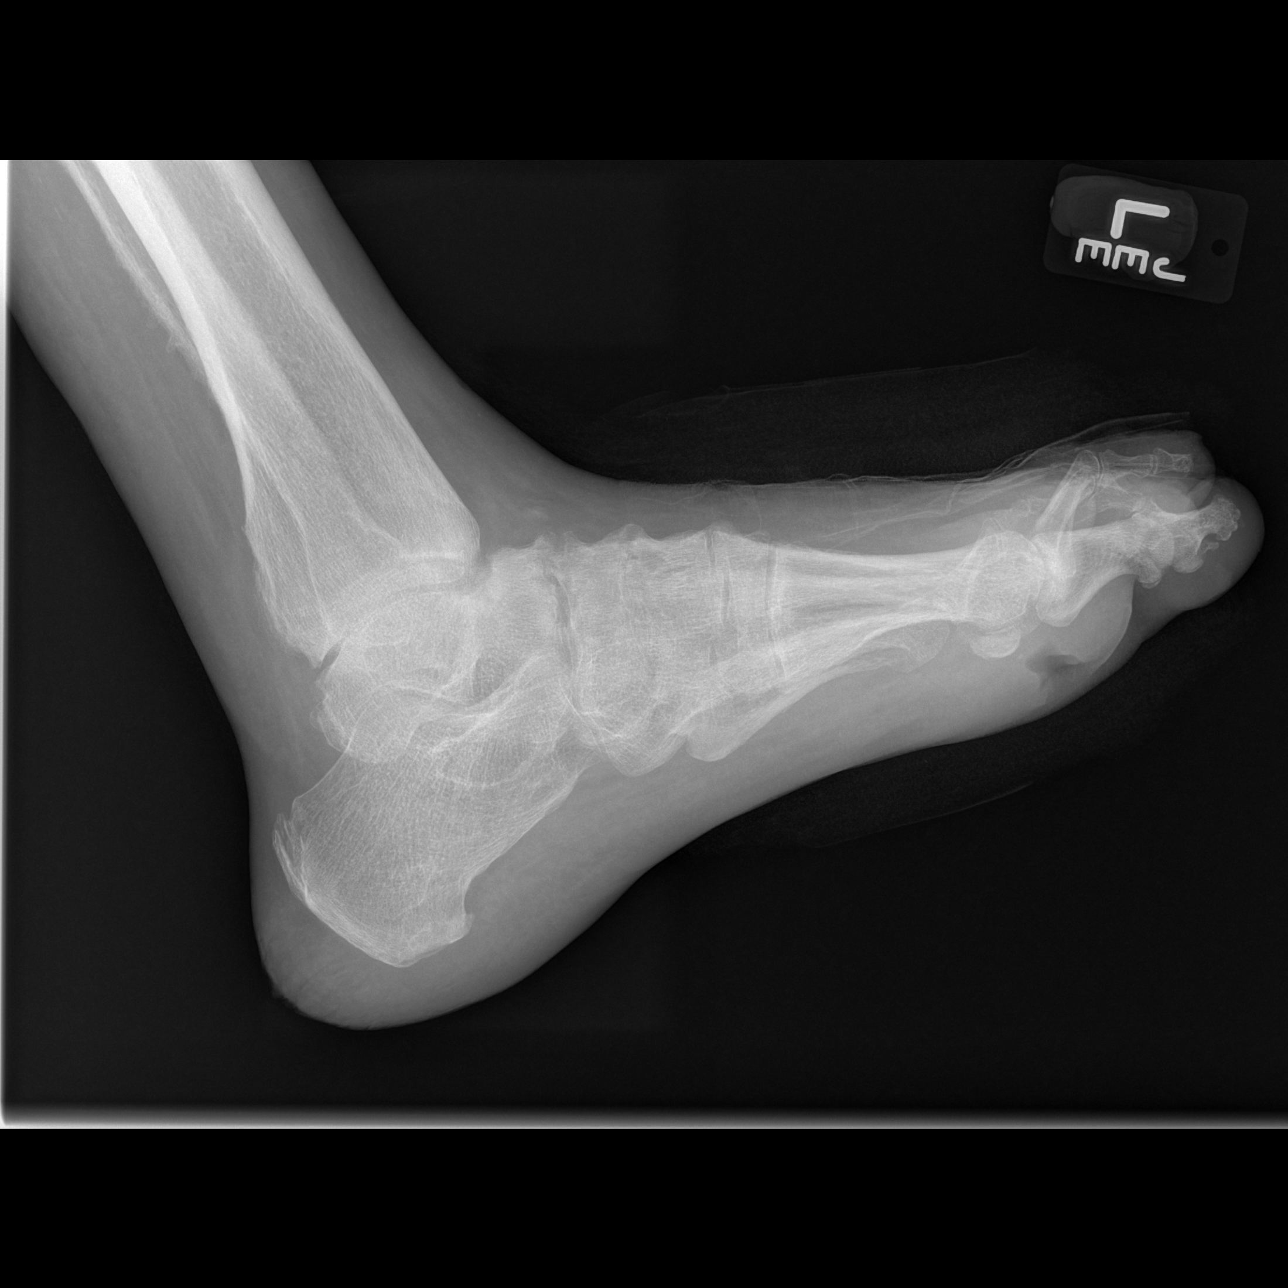

[3 of 3 positions shown; findings below may reference images not displayed]

FINDINGS: There is a soft tissue ulcer on the plantar margin of the forefoot
below the metatarsophalangeal joints. There is lucency of the
second, third and fourth metatarsal heads and the medial base of the
proximal phalanx of the fourth toe. This is most prominent involving
the fourth metatarsal head where there is loss of the white cortical
line. Surrounding forefoot soft tissue swelling is seen.

There is degenerative spurring and along the dorsal midfoot. There
is mild asymmetric joint space narrowing of the first
metatarsophalangeal joint. No other arthropathic change.

Bones are demineralized.
IMPRESSION: 1. Findings consistent with osteomyelitis the head of the fourth
metatarsal and the medial base of the proximal phalanx of the fourth
toe. There is possible additional osteomyelitis in the heads of the
third and second metatarsals. There is a plantar ulcer along the
plantar aspect of the forefoot below the second and third
metatarsophalangeal joints.

## 2017-01-23 ENCOUNTER — Encounter (HOSPITAL_COMMUNITY): Payer: Self-pay | Admitting: Emergency Medicine

## 2017-01-23 ENCOUNTER — Emergency Department (HOSPITAL_COMMUNITY)
Admission: EM | Admit: 2017-01-23 | Discharge: 2017-01-23 | Disposition: A | Discharge status: Home / Self Care | Payer: Medicaid Other | Attending: Emergency Medicine | Admitting: Emergency Medicine

## 2017-01-23 DIAGNOSIS — I10 Essential (primary) hypertension: Secondary | ICD-10-CM | POA: Insufficient documentation

## 2017-01-23 DIAGNOSIS — J449 Chronic obstructive pulmonary disease, unspecified: Secondary | ICD-10-CM | POA: Insufficient documentation

## 2017-01-23 DIAGNOSIS — Z79899 Other long term (current) drug therapy: Secondary | ICD-10-CM | POA: Diagnosis not present

## 2017-01-23 DIAGNOSIS — F17210 Nicotine dependence, cigarettes, uncomplicated: Secondary | ICD-10-CM | POA: Diagnosis not present

## 2017-01-23 DIAGNOSIS — F918 Other conduct disorders: Secondary | ICD-10-CM | POA: Insufficient documentation

## 2017-01-23 DIAGNOSIS — F1410 Cocaine abuse, uncomplicated: Secondary | ICD-10-CM | POA: Diagnosis not present

## 2017-01-23 DIAGNOSIS — Z0000 Encounter for general adult medical examination without abnormal findings: Secondary | ICD-10-CM

## 2017-01-23 LAB — COMPREHENSIVE METABOLIC PANEL
ALT: 22 U/L (ref 17–63)
ANION GAP: 13 (ref 5–15)
AST: 27 U/L (ref 15–41)
Albumin: 4.5 g/dL (ref 3.5–5.0)
Alkaline Phosphatase: 56 U/L (ref 38–126)
BILIRUBIN TOTAL: 0.6 mg/dL (ref 0.3–1.2)
BUN: <5 mg/dL — ABNORMAL LOW (ref 6–20)
CHLORIDE: 100 mmol/L — AB (ref 101–111)
CO2: 21 mmol/L — ABNORMAL LOW (ref 22–32)
Calcium: 9.5 mg/dL (ref 8.9–10.3)
Creatinine, Ser: 0.61 mg/dL (ref 0.61–1.24)
GFR calc Af Amer: >60 mL/min (ref >60)
Glucose, Bld: 101 mg/dL — ABNORMAL HIGH (ref 65–99)
POTASSIUM: 3.2 mmol/L — AB (ref 3.5–5.1)
Sodium: 134 mmol/L — ABNORMAL LOW (ref 135–145)
TOTAL PROTEIN: 8.1 g/dL (ref 6.5–8.1)

## 2017-01-23 LAB — RAPID URINE DRUG SCREEN, HOSP PERFORMED
Amphetamines: NOT DETECTED
Barbiturates: NOT DETECTED
Benzodiazepines: NOT DETECTED
Cocaine: POSITIVE — AB
OPIATES: NOT DETECTED
Tetrahydrocannabinol: NOT DETECTED

## 2017-01-23 LAB — CBC WITH DIFFERENTIAL/PLATELET
BASOS PCT: 0 %
Basophils Absolute: 0 10*3/uL (ref 0.0–0.1)
EOS PCT: 1 %
Eosinophils Absolute: 0.1 10*3/uL (ref 0.0–0.7)
HEMATOCRIT: 40.6 % (ref 39.0–52.0)
Hemoglobin: 14.8 g/dL (ref 13.0–17.0)
LYMPHS PCT: 21 %
Lymphs Abs: 2.6 10*3/uL (ref 0.7–4.0)
MCH: 34.6 pg — ABNORMAL HIGH (ref 26.0–34.0)
MCHC: 36.5 g/dL — AB (ref 30.0–36.0)
MCV: 94.9 fL (ref 78.0–100.0)
MONOS PCT: 6 %
Monocytes Absolute: 0.8 10*3/uL (ref 0.1–1.0)
NEUTROS ABS: 8.7 10*3/uL — AB (ref 1.7–7.7)
Neutrophils Relative %: 72 %
PLATELETS: 268 10*3/uL (ref 150–400)
RBC: 4.28 MIL/uL (ref 4.22–5.81)
RDW: 12.5 % (ref 11.5–15.5)
WBC: 12.2 10*3/uL — ABNORMAL HIGH (ref 4.0–10.5)

## 2017-01-23 LAB — SALICYLATE LEVEL

## 2017-01-23 LAB — ACETAMINOPHEN LEVEL: Acetaminophen (Tylenol), Serum: <10 ug/mL — ABNORMAL LOW (ref 10–30)

## 2017-01-23 LAB — ETHANOL: ALCOHOL ETHYL (B): 12 mg/dL — AB (ref <5)

## 2017-01-23 MED ORDER — POTASSIUM CHLORIDE CRYS ER 20 MEQ PO TBCR: 20.0000 meq | EXTENDED_RELEASE_TABLET | Freq: Two times a day (BID) | ORAL | 0 refills | Status: DC

## 2017-01-23 NOTE — ED Triage Notes (Signed)
Pt presents by EMS under IVC that was initiated by family due to pt drinking and using marijuana and becoming aggressive when intoxicated. Pt denies any SI/HI. Pt calm and cooperative at this time.
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
## 2017-01-23 NOTE — ED Provider Notes (Signed)
[facility]-EMERGENCY DEPT Provider Note   CSN: [id number] Arrival date & time: 01/23/17  0208     History   Chief Complaint Chief Complaint  Patient presents with  . Medical Clearance    HPI [name] is a 61 y.o. male.  Here under Involuntary Petition taken out by sister who is concerned about patient's drinking and aggressive behavior. The patient denies SI/HI/AVH. He does not feel he needs to be here. No physical complaints.    The history is provided by the patient and the police.    Past Medical History:  Diagnosis Date  . Critical lower limb ischemia   . ETOH abuse   . Gout   . Hypertension     Patient Active Problem List   Diagnosis Date Noted  . Subdural hematoma (HCC) 06/19/2014  . Critical lower limb ischemia 12/25/2013  . Osteomyelitis (HCC) 11/06/2013  . HTN (hypertension), benign 11/06/2013  . Alcohol abuse 11/06/2013  . Osteomyelitis of left foot (HCC) 11/06/2013  . COPD (chronic obstructive pulmonary disease) (HCC) 11/06/2013  . Accelerated hypertension 09/25/2013  . Hypokalemia 09/25/2013  . Abscess of left foot 09/21/2013  . Cellulitis of left foot 09/19/2013    Past Surgical History:  Procedure Laterality Date  . ABDOMINAL AORTAGRAM N/A 12/26/2013   Procedure: ABDOMINAL Ronny Flurry;  Surgeon: [name], MD;  Location: [facility] CATH LAB;  Service: Cardiovascular;  Laterality: N/A;  . AMPUTATION Left 12/28/2013   Procedure: LEFT BELOW KNEE AMPUTATION;  Surgeon: Nadara Mustard, MD;  Location: [facility] OR;  Service: Orthopedics;  Laterality: Left;  . BELOW KNEE LEG AMPUTATION     Left  . LEG SURGERY         Home Medications    Prior to Admission medications   Medication Sig Start Date End Date Taking? Authorizing Provider  folic acid (FOLVITE) 1 MG tablet Take 1 tablet (1 mg total) by mouth daily. 06/19/14   Edsel Petrin, DO  Multiple Vitamin (MULTIVITAMIN WITH MINERALS) TABS tablet Take 1 tablet by mouth daily. 06/19/14   Mikhail, Nita Sells, DO    potassium chloride (K-DUR) 10 MEQ tablet Take 1 tablet (10 mEq total) by mouth daily. 06/19/14   Edsel Petrin, DO  thiamine 100 MG tablet Take 1 tablet (100 mg total) by mouth daily. 06/19/14   Mikhail, Nita Sells, DO  triamterene-hydrochlorothiazide (MAXZIDE-25) 37.5-25 MG per tablet Take 1 tablet by mouth daily. 09/26/13   Rodolph Bong, MD    Family History Family History  Problem Relation Age of Onset  . Diabetes Mellitus II Mother   . CAD Neg Hx   . Stroke Neg Hx     Social History Social History  Substance Use Topics  . Smoking status: Current Every Day Smoker    Packs/day: 0.50    Years: 40.00    Types: Cigarettes  . Smokeless tobacco: Never Used  . Alcohol use 72.0 oz/week    120 Cans of beer per week     Comment: "A whole Bunch - I am a drunk"     Allergies   Patient has no known allergies.   Review of Systems Review of Systems  Constitutional: Negative for chills and fever.  HENT: Negative.   Respiratory: Negative.   Cardiovascular: Negative.   Gastrointestinal: Negative.   Musculoskeletal: Negative.   Skin: Negative.   Neurological: Negative.   Psychiatric/Behavioral: Negative for suicidal ideas.     Physical Exam Updated Vital Signs BP (!) 171/98 (BP Location: Left Arm)   Pulse (!) 112  Temp 98.9 F (37.2 C) (Oral)   Resp 17   Ht 5\' 2"  (1.575 m)   Wt 72.6 kg   SpO2 98%   BMI 29.26 kg/m   Physical Exam  Constitutional: He is oriented to person, place, and time. He appears well-developed and well-nourished.  Patient is angry but cooperative and calm.   HENT:  Head: Normocephalic.  Neck: Normal range of motion. Neck supple.  Cardiovascular: Normal rate and regular rhythm.   No murmur heard. Pulmonary/Chest: Effort normal and breath sounds normal. He has no wheezes. He has no rales.  Abdominal: Soft. Bowel sounds are normal. There is no tenderness. There is no rebound and no guarding.  Musculoskeletal: Normal range of motion.  Left LE  amputation.  Neurological: He is alert and oriented to person, place, and time.  Skin: Skin is warm and dry. No rash noted.  Psychiatric: He has a normal mood and affect.     ED Treatments / Results  Labs (all labs ordered are listed, but only abnormal results are displayed) Labs Reviewed - No data to display  EKG  EKG Interpretation None       Radiology No results found.  Procedures Procedures (including critical care time)  Medications Ordered in ED Medications - No data to display   Initial Impression / Assessment and Plan / ED Course  I have reviewed the triage vital signs and the nursing notes.  Pertinent labs & imaging results that were available during my care of the patient were reviewed by me and considered in my medical decision making (see chart for details).     Here under IVC petition for substance abuse and aggressive behavior. Will require TTS consultation for evaluation to determine disposition.   Final Clinical Impressions(s) / ED Diagnoses   Final diagnoses:  None   1. Substance abuse  New Prescriptions New Prescriptions   No medications on file     Elpidio AnisUpstill, Linsey Hirota, Cordelia Poche-C 01/23/17 0334    Melene PlanFloyd, Dan, DO 01/23/17 [phone]

## 2017-01-23 NOTE — ED Notes (Signed)
Bed: [facility] Expected date:  Expected time:  Means of arrival:  Comments: 

## 2017-01-23 NOTE — ED Notes (Signed)
Patient's sister called, stated that she will not be here till after church after 2 PM.  CN informed.
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
## 2017-01-23 NOTE — ED Notes (Signed)
Discharge instructions and prescription reviewed with patient. Patient moved to hall B waiting on brother for transportion to home

## 2017-01-23 NOTE — Progress Notes (Signed)
TTS consult ordered. No EDP note. Unable to complete TTS assessment until the pt has been seen by the EDP. Nursing staff aware.  Princess BruinsAquicha Bawi Lakins, MSW, LCSWA TTS Specialist [phone]

## 2017-01-23 NOTE — ED Notes (Signed)
Called sister Army ChacoRosetta Feutz, to inform her that patient is discharge and need to be pick up.  Rosetta stated that she needs to call her other brother to arrange a transportation and is unsure when they will be here.  Caller instructed her to call back when she arrange a time and to bring patient's wheelchair.

## 2017-08-29 ENCOUNTER — Other Ambulatory Visit: Payer: Self-pay

## 2017-08-29 ENCOUNTER — Inpatient Hospital Stay (HOSPITAL_COMMUNITY)
Admission: EM | Admit: 2017-08-29 | Discharge: 2017-08-31 | DRG: 191 | Disposition: A | Discharge status: Home / Self Care | Payer: Medicaid Other | Attending: Internal Medicine | Admitting: Internal Medicine

## 2017-08-29 ENCOUNTER — Emergency Department (HOSPITAL_COMMUNITY): Payer: Medicaid Other

## 2017-08-29 ENCOUNTER — Encounter (HOSPITAL_COMMUNITY): Payer: Self-pay | Admitting: *Deleted

## 2017-08-29 DIAGNOSIS — Z993 Dependence on wheelchair: Secondary | ICD-10-CM | POA: Diagnosis not present

## 2017-08-29 DIAGNOSIS — Z79899 Other long term (current) drug therapy: Secondary | ICD-10-CM

## 2017-08-29 DIAGNOSIS — F1010 Alcohol abuse, uncomplicated: Secondary | ICD-10-CM | POA: Diagnosis present

## 2017-08-29 DIAGNOSIS — E871 Hypo-osmolality and hyponatremia: Secondary | ICD-10-CM | POA: Diagnosis present

## 2017-08-29 DIAGNOSIS — M109 Gout, unspecified: Secondary | ICD-10-CM

## 2017-08-29 DIAGNOSIS — Z23 Encounter for immunization: Secondary | ICD-10-CM | POA: Diagnosis not present

## 2017-08-29 DIAGNOSIS — I10 Essential (primary) hypertension: Secondary | ICD-10-CM | POA: Diagnosis present

## 2017-08-29 DIAGNOSIS — R0600 Dyspnea, unspecified: Secondary | ICD-10-CM | POA: Diagnosis not present

## 2017-08-29 DIAGNOSIS — Z89519 Acquired absence of unspecified leg below knee: Secondary | ICD-10-CM | POA: Diagnosis not present

## 2017-08-29 DIAGNOSIS — R251 Tremor, unspecified: Secondary | ICD-10-CM | POA: Diagnosis not present

## 2017-08-29 DIAGNOSIS — F1490 Cocaine use, unspecified, uncomplicated: Secondary | ICD-10-CM

## 2017-08-29 DIAGNOSIS — Z8042 Family history of malignant neoplasm of prostate: Secondary | ICD-10-CM

## 2017-08-29 DIAGNOSIS — E876 Hypokalemia: Secondary | ICD-10-CM | POA: Diagnosis present

## 2017-08-29 DIAGNOSIS — Z89512 Acquired absence of left leg below knee: Secondary | ICD-10-CM | POA: Diagnosis not present

## 2017-08-29 DIAGNOSIS — F17210 Nicotine dependence, cigarettes, uncomplicated: Secondary | ICD-10-CM

## 2017-08-29 DIAGNOSIS — Z7289 Other problems related to lifestyle: Secondary | ICD-10-CM

## 2017-08-29 DIAGNOSIS — Z801 Family history of malignant neoplasm of trachea, bronchus and lung: Secondary | ICD-10-CM

## 2017-08-29 DIAGNOSIS — J441 Chronic obstructive pulmonary disease with (acute) exacerbation: Secondary | ICD-10-CM | POA: Diagnosis present

## 2017-08-29 LAB — CBC WITH DIFFERENTIAL/PLATELET
Basophils Absolute: 0 10*3/uL (ref 0.0–0.1)
Basophils Relative: 0 %
EOS ABS: 0.2 10*3/uL (ref 0.0–0.7)
Eosinophils Relative: 1 %
HEMATOCRIT: 39.8 % (ref 39.0–52.0)
HEMOGLOBIN: 13.9 g/dL (ref 13.0–17.0)
LYMPHS ABS: 0.8 10*3/uL (ref 0.7–4.0)
Lymphocytes Relative: 5 %
MCH: 33.8 pg (ref 26.0–34.0)
MCHC: 34.9 g/dL (ref 30.0–36.0)
MCV: 96.8 fL (ref 78.0–100.0)
Monocytes Absolute: 0.2 10*3/uL (ref 0.1–1.0)
Monocytes Relative: 1 %
NEUTROS PCT: 93 %
Neutro Abs: 14.9 10*3/uL — ABNORMAL HIGH (ref 1.7–7.7)
Platelets: 354 10*3/uL (ref 150–400)
RBC: 4.11 MIL/uL — ABNORMAL LOW (ref 4.22–5.81)
RDW: 13 % (ref 11.5–15.5)
WBC: 16 10*3/uL — ABNORMAL HIGH (ref 4.0–10.5)

## 2017-08-29 LAB — RAPID URINE DRUG SCREEN, HOSP PERFORMED
Amphetamines: NOT DETECTED
BENZODIAZEPINES: NOT DETECTED
Barbiturates: NOT DETECTED
COCAINE: POSITIVE — AB
Opiates: NOT DETECTED
Tetrahydrocannabinol: NOT DETECTED

## 2017-08-29 LAB — COMPREHENSIVE METABOLIC PANEL
ALK PHOS: 78 U/L (ref 38–126)
ALT: 16 U/L — ABNORMAL LOW (ref 17–63)
ANION GAP: 11 (ref 5–15)
AST: 21 U/L (ref 15–41)
Albumin: 3.6 g/dL (ref 3.5–5.0)
BILIRUBIN TOTAL: 0.4 mg/dL (ref 0.3–1.2)
BUN: <5 mg/dL — ABNORMAL LOW (ref 6–20)
CALCIUM: 8.5 mg/dL — AB (ref 8.9–10.3)
CO2: 27 mmol/L (ref 22–32)
Chloride: 94 mmol/L — ABNORMAL LOW (ref 101–111)
Creatinine, Ser: 0.53 mg/dL — ABNORMAL LOW (ref 0.61–1.24)
GFR calc non Af Amer: >60 mL/min (ref >60)
Glucose, Bld: 135 mg/dL — ABNORMAL HIGH (ref 65–99)
Potassium: 3.5 mmol/L (ref 3.5–5.1)
SODIUM: 132 mmol/L — AB (ref 135–145)
TOTAL PROTEIN: 7 g/dL (ref 6.5–8.1)

## 2017-08-29 LAB — TROPONIN I

## 2017-08-29 LAB — BRAIN NATRIURETIC PEPTIDE: B NATRIURETIC PEPTIDE 5: 62.6 pg/mL (ref 0.0–100.0)

## 2017-08-29 MED ORDER — ACETAMINOPHEN 325 MG PO TABS: 650.0000 mg | ORAL_TABLET | Freq: Four times a day (QID) | ORAL | Status: DC | PRN

## 2017-08-29 MED ORDER — LORAZEPAM 2 MG/ML IJ SOLN: 1.0000 mg | Freq: Four times a day (QID) | INTRAMUSCULAR | Status: DC | PRN

## 2017-08-29 MED ORDER — THIAMINE HCL 100 MG/ML IJ SOLN
100.0000 mg | Freq: Every day | INTRAMUSCULAR | Status: DC
  Filled 2017-08-29: qty 2

## 2017-08-29 MED ORDER — FOLIC ACID 1 MG PO TABS
1.0000 mg | ORAL_TABLET | Freq: Every day | ORAL | Status: DC
  Administered 2017-08-29 – 2017-08-31 (×3): 1 mg via ORAL
  Filled 2017-08-29 (×3): qty 1

## 2017-08-29 MED ORDER — ADULT MULTIVITAMIN W/MINERALS CH
1.0000 | ORAL_TABLET | Freq: Every day | ORAL | Status: DC
  Administered 2017-08-29 – 2017-08-31 (×3): 1 via ORAL
  Filled 2017-08-29 (×3): qty 1

## 2017-08-29 MED ORDER — POTASSIUM CHLORIDE CRYS ER 20 MEQ PO TBCR
20.0000 meq | EXTENDED_RELEASE_TABLET | Freq: Two times a day (BID) | ORAL | Status: DC
  Administered 2017-08-29 – 2017-08-31 (×4): 20 meq via ORAL
  Filled 2017-08-29 (×4): qty 1

## 2017-08-29 MED ORDER — ALBUTEROL SULFATE (2.5 MG/3ML) 0.083% IN NEBU
5.0000 mg | INHALATION_SOLUTION | Freq: Once | RESPIRATORY_TRACT | Status: AC
  Administered 2017-08-29: 5 mg via RESPIRATORY_TRACT
  Filled 2017-08-29: qty 6

## 2017-08-29 MED ORDER — LORAZEPAM 1 MG PO TABS
1.0000 mg | ORAL_TABLET | Freq: Four times a day (QID) | ORAL | Status: DC | PRN
  Administered 2017-08-29: 1 mg via ORAL
  Filled 2017-08-29 (×2): qty 1

## 2017-08-29 MED ORDER — SPIRITUS FRUMENTI
1.0000 | Freq: Two times a day (BID) | ORAL | Status: DC
  Filled 2017-08-29 (×2): qty 1

## 2017-08-29 MED ORDER — ACETAMINOPHEN 650 MG RE SUPP: 650.0000 mg | Freq: Four times a day (QID) | RECTAL | Status: DC | PRN

## 2017-08-29 MED ORDER — ALBUTEROL (5 MG/ML) CONTINUOUS INHALATION SOLN
10.0000 mg/h | INHALATION_SOLUTION | Freq: Once | RESPIRATORY_TRACT | Status: AC
  Administered 2017-08-29: 10 mg/h via RESPIRATORY_TRACT

## 2017-08-29 MED ORDER — SENNOSIDES-DOCUSATE SODIUM 8.6-50 MG PO TABS: 1.0000 | ORAL_TABLET | Freq: Every evening | ORAL | Status: DC | PRN

## 2017-08-29 MED ORDER — SPIRITUS FRUMENTI: 1.0000 | Freq: Two times a day (BID) | ORAL | Status: DC

## 2017-08-29 MED ORDER — VITAMIN B-1 100 MG PO TABS
100.0000 mg | ORAL_TABLET | Freq: Every day | ORAL | Status: DC
  Administered 2017-08-29 – 2017-08-31 (×3): 100 mg via ORAL
  Filled 2017-08-29 (×3): qty 1

## 2017-08-29 MED ORDER — PREDNISONE 20 MG PO TABS
40.0000 mg | ORAL_TABLET | Freq: Every day | ORAL | Status: DC
  Administered 2017-08-29 – 2017-08-31 (×3): 40 mg via ORAL
  Filled 2017-08-29 (×3): qty 2

## 2017-08-29 MED ORDER — LORAZEPAM 1 MG PO TABS: 0.0000 mg | ORAL_TABLET | Freq: Four times a day (QID) | ORAL | Status: AC

## 2017-08-29 MED ORDER — IPRATROPIUM-ALBUTEROL 0.5-2.5 (3) MG/3ML IN SOLN
3.0000 mL | Freq: Four times a day (QID) | RESPIRATORY_TRACT | Status: DC
  Administered 2017-08-29 – 2017-08-30 (×4): 3 mL via RESPIRATORY_TRACT
  Filled 2017-08-29 (×4): qty 3

## 2017-08-29 MED ORDER — ALBUTEROL SULFATE (2.5 MG/3ML) 0.083% IN NEBU
INHALATION_SOLUTION | RESPIRATORY_TRACT | Status: AC
  Filled 2017-08-29: qty 3

## 2017-08-29 MED ORDER — TRIAMTERENE-HCTZ 37.5-25 MG PO TABS
1.0000 | ORAL_TABLET | Freq: Every day | ORAL | Status: DC
  Administered 2017-08-29 – 2017-08-31 (×3): 1 via ORAL
  Filled 2017-08-29 (×3): qty 1

## 2017-08-29 MED ORDER — LORAZEPAM 1 MG PO TABS: 0.0000 mg | ORAL_TABLET | Freq: Two times a day (BID) | ORAL | Status: DC

## 2017-08-29 MED ORDER — ENOXAPARIN SODIUM 40 MG/0.4ML ~~LOC~~ SOLN
40.0000 mg | SUBCUTANEOUS | Status: DC
  Administered 2017-08-29 – 2017-08-30 (×2): 40 mg via SUBCUTANEOUS
  Filled 2017-08-29 (×2): qty 0.4

## 2017-08-29 MED ORDER — AZITHROMYCIN 500 MG PO TABS
250.0000 mg | ORAL_TABLET | Freq: Every day | ORAL | Status: DC
  Administered 2017-08-30 – 2017-08-31 (×2): 250 mg via ORAL
  Filled 2017-08-29 (×2): qty 1

## 2017-08-29 MED ORDER — DEXTROSE 5 % IV SOLN
500.0000 mg | Freq: Once | INTRAVENOUS | Status: AC
  Administered 2017-08-29: 500 mg via INTRAVENOUS
  Filled 2017-08-29: qty 500

## 2017-08-29 NOTE — ED Provider Notes (Signed)
[facility] Provider Note   CSN: [id number] Arrival date & time: 08/29/17  [id number]     History   Chief Complaint Chief Complaint  Patient presents with  . Shortness of Breath    HPI Ronald [name] is a 61 y.o. male.  HPI  Patient presents with dyspnea. Patient states that over the past week he has been increasingly weak, with dyspnea, but over the past day or so he has become more severely dyspneic, and uncomfortable. No focal chest pain, no headache. He continues to smoke cigarettes, uses crack cocaine, drinks alcohol. He denies a history of COPD, emphysema, asthma, bronchitis. He states that he is otherwise well. He does have a history of prior leg infection requiring amputation, but denies a history of diabetes, hypertension.  EMS reports that the patient received Solu-Medrol, albuterol, Atrovent in route, with minimal improvement.    Past Medical History:  Diagnosis Date  . Critical lower limb ischemia   . ETOH abuse   . Gout   . Hypertension     Patient Active Problem List   Diagnosis Date Noted  . Subdural hematoma (HCC) 06/19/2014  . Critical lower limb ischemia 12/25/2013  . Osteomyelitis (HCC) 11/06/2013  . HTN (hypertension), benign 11/06/2013  . Alcohol abuse 11/06/2013  . Osteomyelitis of left foot (HCC) 11/06/2013  . COPD (chronic obstructive pulmonary disease) (HCC) 11/06/2013  . Accelerated hypertension 09/25/2013  . Hypokalemia 09/25/2013  . Abscess of left foot 09/21/2013  . Cellulitis of left foot 09/19/2013    Past Surgical History:  Procedure Laterality Date  . ABDOMINAL AORTAGRAM N/A 12/26/2013   Procedure: ABDOMINAL [name];  Surgeon: Nada LibmanVance W Brabham, MD;  [facility] C[facility] LAB;  Service: Cardiovascular;  Laterality: N/A;  . AMPUTATION Left 12/28/2013   Procedure: LEFT BELOW KNEE AMPUTATION;  Surgeon: [name], MD;  Location: [facility] OR;  Service: Orthopedics;  Laterality: Left;  . BELOW KNEE LEG  AMPUTATION     Left  . LEG SURGERY         Home Medications    Prior to Admission medications   Medication Sig Start Date End Date Taking? Authorizing Provider  ibuprofen (ADVIL,MOTRIN) 200 MG tablet Take 400 mg by mouth every 6 (six) hours as needed for headache or mild pain.   Yes [provider]  triamterene-hydrochlorothiazide (MAXZIDE-25) 37.5-25 MG per tablet Take 1 tablet by mouth daily. 09/26/13  Yes Rodolph Bonghompson, Daniel V, MD  folic acid (FOLVITE) 1 MG tablet Take 1 tablet (1 mg total) by mouth daily. Patient not taking: Reported on 08/29/2017 06/19/14   Edsel PetrinMikhail, Maryann, DO  Multiple Vitamin (MULTIVITAMIN WITH MINERALS) TABS tablet Take 1 tablet by mouth daily. Patient not taking: Reported on 01/23/2017 06/19/14   Edsel PetrinMikhail, Maryann, DO  potassium chloride (K-DUR) 10 MEQ tablet Take 1 tablet (10 mEq total) by mouth daily. Patient not taking: Reported on 01/23/2017 06/19/14   Edsel PetrinMikhail, Maryann, DO  potassium chloride SA (K-DUR,KLOR-CON) 20 MEQ tablet Take 1 tablet (20 mEq total) by mouth 2 (two) times daily. Patient not taking: Reported on 08/29/2017 01/23/17   Elpidio AnisUpstill, Shari, PA-C  thiamine 100 MG tablet Take 1 tablet (100 mg total) by mouth daily. Patient not taking: Reported on 01/23/2017 06/19/14   Edsel PetrinMikhail, Maryann, DO    Family History Family History  Problem Relation Age of Onset  . Diabetes Mellitus II Mother   . CAD Neg Hx   . Stroke Neg Hx     Social History Social History  Tobacco Use  . Smoking status: Current Every Day Smoker    Packs/day: 0.50    Years: 40.00    Pack years: 20.00    Types: Cigarettes  . Smokeless tobacco: Never Used  Substance Use Topics  . Alcohol use: Yes    Alcohol/week: 72.0 oz    Types: 120 Cans of beer per week    Comment: 2 -3 40 oz beers per day  . Drug use: Yes    Types: Cocaine, Marijuana    Comment: crack     Allergies   Patient has no known allergies.   Review of Systems Review of Systems  Constitutional:        Per HPI, otherwise negative  HENT:       Per HPI, otherwise negative  Respiratory:       Per HPI, otherwise negative  Cardiovascular:       Per HPI, otherwise negative  Gastrointestinal: Negative for vomiting.  Endocrine:       Negative aside from HPI  Genitourinary:       Neg aside from HPI   Musculoskeletal:       Per HPI, otherwise negative  Skin: Negative.   Neurological: Positive for weakness. Negative for syncope.     Physical Exam Updated Vital Signs BP 124/80   Pulse (!) 118   Temp 99 F (37.2 C)   Resp (!) 21   Ht 5\' 2"  (1.575 m)   Wt 72.6 kg (160 lb)   SpO2 94%   BMI 29.26 kg/m   Physical Exam  Constitutional:  Uncomfortable appearing male receiving supplemental oxygen Via Ventimask  HENT:  Head: Normocephalic and atraumatic.  Eyes: Conjunctivae are normal.  Neck: Neck supple.  Cardiovascular: Normal rate and regular rhythm.  No murmur heard. Pulmonary/Chest: He has decreased breath sounds. He has wheezes.  Abdominal: Soft. There is no tenderness.  Musculoskeletal: He exhibits no edema.       Legs: Neurological: He is alert.  Skin: Skin is warm and dry.  Psychiatric: He has a normal mood and affect.  Nursing note and vitals reviewed.    ED Treatments / Results  Labs (all labs ordered are listed, but only abnormal results are displayed) Labs Reviewed  CBC WITH DIFFERENTIAL/PLATELET - Abnormal; Notable for the following components:      Result Value   WBC 16.0 (*)    RBC 4.11 (*)    Neutro Abs 14.9 (*)    All other components within normal limits  BRAIN NATRIURETIC PEPTIDE  COMPREHENSIVE METABOLIC PANEL  TROPONIN I    EKG  EKG Interpretation  Date/Time:  Monday August 29 2017 08:40:25 EST Ventricular Rate:  111 PR Interval:    QRS Duration: 74 QT Interval:  331 QTC Calculation: 450 R Axis:   -2 Text Interpretation:  Sinus tachycardia Artifact T wave abnormality Premature atrial complexes Abnormal ekg Confirmed by Gerhard MunchLockwood, Everlynn Sagun  [id number]) on 08/29/2017 9:44:45 AM       Radiology Dg Chest Portable 1 View  Result Date: 08/29/2017 CLINICAL DATA:  Cough, chest congestion, fever, and shortness of breath. History of COPD, osteomyelitis with left BKA. EXAM: PORTABLE CHEST 1 VIEW COMPARISON:  Chest x-ray of June 19, 2014. FINDINGS: The lungs are well-expanded and clear. The heart and pulmonary vascularity are normal. The mediastinum is normal in width. There is no pleural effusion. There is faint calcification in the wall of the thoracic aorta. There is no acute bony abnormality. IMPRESSION: Hyperinflation consistent with COPD. No acute cardiopulmonary abnormality.  Thoracic aortic atherosclerosis. Electronically Signed   By: David  [place] M.D.   On: 08/29/2017 09:13    Procedures Procedures (including critical care time)  Medications Ordered in ED Medications  albuterol (PROVENTIL) (2.5 MG/3ML) 0.083% nebulizer solution (  Not Given 08/29/17 0907)  albuterol (PROVENTIL,VENTOLIN) solution continuous neb (10 mg/hr Nebulization Given 08/29/17 0903)     Initial Impression / Assessment and Plan / ED Course  I have reviewed the triage vital signs and the nursing notes.  Pertinent labs & imaging results that were available during my care of the patient were reviewed by me and considered in my medical decision making (see chart for details).  Update: After 1 hour continuous neb, patient has continued diminished respiratory sounds, though he states he feels somewhat better.  Update:, Now patient has received additional albuterol. He remains hypoxic on room air, 85%, requiring nasal cannula to maintain appropriate saturation. No early evidence for pneumonia, but given his use of cigarettes, crack, and need for admission given hypoxia, COPD exacerbation, patient will also receive empiric antibiotic therapy. He has already received steroids.   Final Clinical Impressions(s) / ED Diagnoses  COPD exacerbation   Gerhard Munch, MD 08/29/17 1213

## 2017-08-29 NOTE — ED Triage Notes (Signed)
Pt here from home via [facility] for sob.  States cold for several weeks, but sob is increasing.  Wheezing and labored breathing noted upon ems arrival.  10 mg albuterol and 0.5 atrovent and 125 solumedrol given en-route.  Pt remains labored.

## 2017-08-29 NOTE — ED Notes (Signed)
Patient eating lunch independently. No acute distress noted.

## 2017-08-29 NOTE — ED Notes (Signed)
Attempted to call report to [facility]. Receiving floor nurse did not answer phone to receive report. Name & phone number left with NS. Awaiting return call to give report.

## 2017-08-29 NOTE — Progress Notes (Signed)
Patient admitted to 5W 15 at [id number]. Alert and oriented x 4. Telemetry box 17 in place and verified. Call bell in reach. Will continue and treat per MD orders.

## 2017-08-29 NOTE — ED Notes (Signed)
RN had left oxygen off patient to test pulse ox level on room air. Pulse ox on room air decreased to 86%. Oxygen @ 2 LPM via nasal cannula applied. Pulse ox 94-95% at this time. No resp distress noted.

## 2017-08-29 NOTE — H&P (Signed)
Date: 08/29/2017               Patient Name:  Ronald [name] MRN: [id number]  DOB: April 15, 1956 Age / Sex: 61 y.o., male   PCP: Gwenyth Benderean, Eric L, MD         Medical Service: Internal Medicine Teaching Service         Attending Physician: Dr. Gerhard MunchLockwood, Robert, MD    First Contact: Dr. Crista ElliotHarbrecht Pager: [phone]  Second Contact: Dr. Mikey BussingHoffman Pager: [phone]       After Hours (After 5p/  First Contact Pager: [phone]  weekends / holidays): Second Contact Pager: [phone]   Chief Complaint: Shortness of breath  History of Present Illness: Ronald Degollado is a pleasant 61 year old male who [facility] SOB of one week duration. He has a past medical history significant EtOH abuse, gout, HTN and critical limb ischemia s/p BKA. He stated that he was in his usual state of health until approximately one month prior when he developed a productive cough. The cough was followed three weeks later by  SOB when at about the same time the sputum production decreased significantly. The patient stated that this morning he awoke in a panic given the severity of the SOB and decided to obtain assistance by calling 911. He was given solumedrol, Atrovent and albuterol while in route without significant improvement in his symptoms.   He denied fever, chills, nausea, vomiting, diarrhea, constipation, chest pain, headache, peripheral edema, orthopnea, muscle aches, rhinorrhea, ear pain, pharyngitis, or abdominal pain.   In the ED, the patient with received BNP indicated 62.6, troponin <0.03, CMP 132/3.5/27/<5/0.53/135, CBC 16/13.9/39.8/354. Chest Xray was unremarkable, clear costophrenic angles, not significantly enlarged(as expected with chronic emphysema). EKG not notable for ST changes.  Meds:  Current Meds  Medication Sig  . ibuprofen (ADVIL,MOTRIN) 200 MG tablet Take 400 mg by mouth every 6 (six) hours as needed for headache or mild pain.  Marland Kitchen. triamterene-hydrochlorothiazide  (MAXZIDE-25) 37.5-25 MG per tablet Take 1 tablet by mouth daily.    Allergies: Allergies as of 08/29/2017  . (No Known Allergies)   Past Medical History:  Diagnosis Date  . Critical lower limb ischemia   . ETOH abuse   . Gout   . Hypertension     Family History:  Father, prostate cancer, Lung cancer.  Social History:  45 year-ppd history EtOH- 2x 40oz beer daily Crack cocaine use, last use one year prior.   Review of Systems: A complete ROS was negative except as per HPI.   Physical Exam: Blood pressure (!) 137/94, pulse (!) 108, temperature 99 F (37.2 C), resp. rate 18, height 5\' 2"  (1.575 m), weight 160 lb (72.6 kg), SpO2 98 %. Physical Exam  Constitutional: He is oriented to person, place, and time. He appears well-developed and well-nourished. He does not appear ill. No distress.  HENT:  Mouth/Throat: No oropharyngeal exudate.  Eyes: Pupils are equal, round, and reactive to light.  Cardiovascular: Tachycardia present.  No murmur heard. Pulmonary/Chest: No accessory muscle usage. No tachypnea. No respiratory distress. He has decreased breath sounds. He has wheezes in the right lower field and the left lower field. He has no rhonchi.  Abdominal: Soft. He exhibits no distension. There is no tenderness.  Musculoskeletal:       Right lower leg: Normal. He exhibits no tenderness and no edema.  Neurological: He is alert and oriented to person, place, and time.  Skin: Skin is warm. Capillary refill takes  less than 2 seconds.  Nursing note and vitals reviewed.  EKG: personally reviewed my interpretation is that it is sinus tachycardia with variable QRS waveforms and probable left atrial enlargement. No acute ST elevation/depression noted.   CXR: personally reviewed my interpretation is that there are no acute cardiopulmonary abnormalities. Decent film good exposure with clear costophrenic angles.  Assessment & Plan by Problem: Active Problems:   COPD exacerbation  [facility](HCC)  Ronald Salay is a 61 year old male who presents with slow onset shortness of breath x1 week.  Given the patient's smoking history, physical exam, history of COPD, chest x-ray findings, the patient likely suffering from a COPD exacerbation versus asthma exacerbation.  Troponin was negative, BNP was 62, no pedal edema and he denies orthopnea, he denies chest pain and as such this does not appear to be consistent with ACS or CHF.  COPD exacerbation: The patient was given a dose of Solu-Medrol, albuterol, Atrovent P in route with EMS.  This did not significantly improve his symptoms. -We will continue prednisone for a total of a 5-day course -We will initiate 8 azithromycin for 5-day course -We will continue cardiac monitoring, tachycardic on exam, most likely from  --Will ambulate with pulse oximetry in am for O2 status  --Patient's O2 sats 98% on 2L via nasal cannula   HTN: We will continue his home antihypertensive medications -triamterene-HCTZ 37.5-25mg   Hypokalemia: History of hypokalemia. Etiology unclear Will continue home K-dur 20mEq BID  EtOH abuse: Will place the patient on CIWA protocol given his history of EtOH use. Ativan PRN as per CIWA. 1mg  PO or IV PRN   Diet: regular Code: Full Fluids:  None at this time Dvt ppx: enoxaparin 40mg  Dispo: Admit patient to Inpatient with expected length of stay greater than 2 midnights.  Signed: Lanelle BalHarbrecht, Aleem Elza, MD 08/29/2017, 12:45 PM  Pager: Pager# [phone]

## 2017-08-30 DIAGNOSIS — F1010 Alcohol abuse, uncomplicated: Secondary | ICD-10-CM

## 2017-08-30 DIAGNOSIS — Z993 Dependence on wheelchair: Secondary | ICD-10-CM

## 2017-08-30 LAB — BASIC METABOLIC PANEL
Anion gap: 9 (ref 5–15)
BUN: 12 mg/dL (ref 6–20)
CO2: 29 mmol/L (ref 22–32)
CREATININE: 0.72 mg/dL (ref 0.61–1.24)
Calcium: 9.3 mg/dL (ref 8.9–10.3)
Chloride: 94 mmol/L — ABNORMAL LOW (ref 101–111)
GFR calc Af Amer: >60 mL/min (ref >60)
Glucose, Bld: 119 mg/dL — ABNORMAL HIGH (ref 65–99)
Potassium: 4.4 mmol/L (ref 3.5–5.1)
SODIUM: 132 mmol/L — AB (ref 135–145)

## 2017-08-30 LAB — CBC
HCT: 37.4 % — ABNORMAL LOW (ref 39.0–52.0)
Hemoglobin: 12.9 g/dL — ABNORMAL LOW (ref 13.0–17.0)
MCH: 33.5 pg (ref 26.0–34.0)
MCHC: 34.5 g/dL (ref 30.0–36.0)
MCV: 97.1 fL (ref 78.0–100.0)
PLATELETS: 375 10*3/uL (ref 150–400)
RBC: 3.85 MIL/uL — ABNORMAL LOW (ref 4.22–5.81)
RDW: 13.2 % (ref 11.5–15.5)
WBC: 11.4 10*3/uL — AB (ref 4.0–10.5)

## 2017-08-30 LAB — HIV ANTIBODY (ROUTINE TESTING W REFLEX): HIV Screen 4th Generation wRfx: NONREACTIVE

## 2017-08-30 MED ORDER — SPIRITUS FRUMENTI
1.0000 | Freq: Two times a day (BID) | ORAL | Status: DC
  Administered 2017-08-30 – 2017-08-31 (×3): 1 via ORAL
  Filled 2017-08-30 (×5): qty 1

## 2017-08-30 MED ORDER — INFLUENZA VAC SPLIT QUAD 0.5 ML IM SUSY
0.5000 mL | PREFILLED_SYRINGE | INTRAMUSCULAR | Status: AC
  Administered 2017-08-31: 0.5 mL via INTRAMUSCULAR
  Filled 2017-08-30: qty 0.5

## 2017-08-30 MED ORDER — PNEUMOCOCCAL VAC POLYVALENT 25 MCG/0.5ML IJ INJ
0.5000 mL | INJECTION | INTRAMUSCULAR | Status: AC
  Administered 2017-08-31: 0.5 mL via INTRAMUSCULAR
  Filled 2017-08-30: qty 0.5

## 2017-08-30 MED ORDER — IPRATROPIUM-ALBUTEROL 0.5-2.5 (3) MG/3ML IN SOLN
3.0000 mL | Freq: Three times a day (TID) | RESPIRATORY_TRACT | Status: DC
  Administered 2017-08-30 – 2017-08-31 (×2): 3 mL via RESPIRATORY_TRACT
  Filled 2017-08-30 (×2): qty 3

## 2017-08-30 NOTE — Progress Notes (Signed)
   Subjective: The patient was resting in his bed today upon entering the room. He stated that he was feeling much better today, denied SOB and added that his cough had greatly improved but persisted. He denied fever, chills, chest pain, abdominal pain, muscle aches, visual changes, nausea or vomiting.   Objective:  Vital signs in last 24 hours: Vitals:   08/30/17 0324 08/30/17 0522 08/30/17 0826 08/30/17 1150  BP:  134/80  137/88  Pulse:  70 76 89  Resp:  18 18 (!) 21  Temp:  98.1 F (36.7 C)  97.8 F (36.6 C)  TempSrc:  Oral  Oral  SpO2: 100% 93% 94% 98%  Weight:      Height:       ROS negative except as per HPI.  Physical Exam  Constitutional: He appears well-developed and well-nourished.  Cardiovascular: Normal rate and regular rhythm.  Pulmonary/Chest: Effort normal. No accessory muscle usage or stridor. No tachypnea. He has decreased breath sounds (Significantly improved from the prior day) in the right lower field and the left lower field. He has wheezes in the right upper field and the left upper field.  Abdominal: Soft. Bowel sounds are normal.  Musculoskeletal:       Right lower leg: He exhibits no tenderness and no edema.  Neurological: He is alert. He is not disoriented.   Assessment/Plan:  Principal Problem:   COPD exacerbation (HCC) Active Problems:   Mild alcohol use disorder  COPD exacerbation: The patient was given a dose of Solu-Medrol, albuterol, Atrovent P in route with EMS.  This did not significantly improve his symptoms. -We will continue prednisone for a total of a 5-day course, on day two. -We will initiate 8 azithromycin for 5-day course, on day two -We will continue cardiac monitoring  --Will ambulate with pulse oximetry in am for O2 status  --Patient's O2 sats >94% on 2L via nasal cannula  --Should be stable for discharge home tomorrow if he continues to improve  HTN: We will continue his home antihypertensive  medications --Triamterene-HCTZ 37.5-25mg  --BP today is 137/88  Hypokalemia: History of hypokalemia. Etiology unclear --Will continue home K-dur 20mEq BID, potassium 4.4 today  EtOH abuse: --Will place the patient on CIWA protocol given his history of EtOH use. --Ativan PRN as per CIWA. 1mg  PO or IV PRN   Diet: regular Code: Full Fluids:  None at this time Dvt ppx: enoxaparin 40mg  Dispo: Anticipated discharge in approximately 1 day(s).   Lanelle BalHarbrecht, Leopold Smyers, MD 08/30/2017, 11:58 AM Pager: Pager# [phone]

## 2017-08-30 NOTE — Evaluation (Signed)
Physical Therapy Evaluation Patient Details Name: Ronald [name] MRN: [id number] DOB: [birth year] Today's Date: 08/30/2017   History of Present Illness   Ronald [name] is a 61yo man with PMH of ETOH use, gout, HTN, BKA who presented for 1 month of productive cough which worsened and has caused 1 week of SOB which has slowly worsened.  Clinical Impression  Pt is at or close to baseline functioning and should be safe at home with available assist. There are no further acute PT needs.  Will sign off at this time.     Follow Up Recommendations No PT follow up;Supervision - Intermittent    Equipment Recommendations  None recommended by PT    Recommendations for Other Services       Precautions / Restrictions Precautions Precautions: Fall(lower fall risk)      Mobility  Bed Mobility Overal bed mobility: Modified Independent                Transfers Overall transfer level: Modified independent   Transfers: Squat Pivot Transfers     Squat pivot transfers: Modified independent (Device/Increase time)     General transfer comment: Smooth transfer to/from armless chair both directions  Ambulation/Gait             General Gait Details: not able, but has not used his prosthesis in ~3 years due to leg dimension changes.  Pt stays at w/c level.  Stairs            Wheelchair Mobility    Modified Rankin (Stroke Patients Only)       Balance Overall balance assessment: Needs assistance Sitting-balance support: No upper extremity supported Sitting balance-Leahy Scale: Good(to normal.  maintained balance against mod/max resistance)                                       Pertinent Vitals/Pain Pain Assessment: No/denies pain    Home Living Family/patient expects to be discharged to:: Private residence Living Arrangements: Alone Available Help at Discharge: Friend(s);Other (Comment)(pt reports several people cover up to 20 hours a day.) Type of  Home: House Home Access: Ramped entrance     Home Layout: One level Home Equipment: Wheelchair - manual;Bedside commode(ramp)      Prior Function Level of Independence: Independent with assistive device(s)         Comments: takes sponge baths and otherwise is independent at w/c level.     Hand Dominance        Extremity/Trunk Assessment   Upper Extremity Assessment Upper Extremity Assessment: Overall WFL for tasks assessed    Lower Extremity Assessment Lower Extremity Assessment: Overall WFL for tasks assessed(bil tremors/co contraction, L LE much worse than R LE)       Communication   Communication: No difficulties  Cognition Arousal/Alertness: Awake/alert Behavior During Therapy: WFL for tasks assessed/performed Overall Cognitive Status: Within Functional Limits for tasks assessed                                        General Comments General comments (skin integrity, edema, etc.): Does not want to pursue prosthetic assessment and refitting.    Exercises     Assessment/Plan    PT Assessment Patent does not need any further PT services  PT Problem List         PT  Treatment Interventions      PT Goals (Current goals can be found in the Care Plan section)  Acute Rehab PT Goals PT Goal Formulation: All assessment and education complete, DC therapy    Frequency     Barriers to discharge        Co-evaluation               AM-PAC PT "6 Clicks" Daily Activity  Outcome Measure Difficulty turning over in bed (including adjusting bedclothes, sheets and blankets)?: None Difficulty moving from lying on back to sitting on the side of the bed? : None Difficulty sitting down on and standing up from a chair with arms (e.g., wheelchair, bedside commode, etc,.)?: Unable Help needed moving to and from a bed to chair (including a wheelchair)?: A Little Help needed walking in hospital room?: Total Help needed climbing 3-5 steps with a  railing? : Total 6 Click Score: 14    End of Session   Activity Tolerance: Patient tolerated treatment well Patient left: in bed;with call bell/phone within reach;with bed alarm set Nurse Communication: Mobility status PT Visit Diagnosis: Other abnormalities of gait and mobility (R26.89)    Time: [id number] PT Time Calculation (min) (ACUTE ONLY): 21 min   Charges:   PT Evaluation $PT Eval Low Complexity: 1 Low     PT G Codes:        08/30/2017  Ronald [name], PT [phone]  (pager)  Eliseo GumKenneth V Zylpha Poynor 08/30/2017, 5:21 PM

## 2017-08-31 LAB — BASIC METABOLIC PANEL
ANION GAP: 9 (ref 5–15)
BUN: 9 mg/dL (ref 6–20)
CHLORIDE: 97 mmol/L — AB (ref 101–111)
CO2: 29 mmol/L (ref 22–32)
Calcium: 9.5 mg/dL (ref 8.9–10.3)
Creatinine, Ser: 0.77 mg/dL (ref 0.61–1.24)
GFR calc non Af Amer: >60 mL/min (ref >60)
Glucose, Bld: 93 mg/dL (ref 65–99)
POTASSIUM: 3.8 mmol/L (ref 3.5–5.1)
Sodium: 135 mmol/L (ref 135–145)

## 2017-08-31 LAB — GLUCOSE, CAPILLARY: GLUCOSE-CAPILLARY: 103 mg/dL — AB (ref 65–99)

## 2017-08-31 MED ORDER — AZITHROMYCIN 250 MG PO TABS: 250.0000 mg | ORAL_TABLET | Freq: Every day | ORAL | 0 refills | Status: DC

## 2017-08-31 MED ORDER — PREDNISONE 20 MG PO TABS: 40.0000 mg | ORAL_TABLET | Freq: Every day | ORAL | 0 refills | Status: DC

## 2017-08-31 MED ORDER — IPRATROPIUM-ALBUTEROL 0.5-2.5 (3) MG/3ML IN SOLN: 3.0000 mL | Freq: Two times a day (BID) | RESPIRATORY_TRACT | Status: DC

## 2017-08-31 MED ORDER — IPRATROPIUM-ALBUTEROL 0.5-2.5 (3) MG/3ML IN SOLN: 3.0000 mL | RESPIRATORY_TRACT | Status: DC | PRN

## 2017-08-31 MED ORDER — IPRATROPIUM-ALBUTEROL 0.5-2.5 (3) MG/3ML IN SOLN
3.0000 mL | Freq: Two times a day (BID) | RESPIRATORY_TRACT | Status: DC
  Filled 2017-08-31: qty 3

## 2017-08-31 NOTE — Progress Notes (Signed)
   Subjective: Patient was sitting up in his bed today upon entering the room. He states that he feels much better and would like to go home today. He is not longer experiencing tremor today. The patient denied SOB, chest pain, rhinorrhea, headache, visual changes, worsening cough or sputum production.   Objective:  Vital signs in last 24 hours: Vitals:   08/30/17 1430 08/30/17 1956 08/30/17 2245 08/31/17 0516  BP:   (!) 150/81 132/75  Pulse:  90 80 74  Resp:  20 18 19   Temp:   98.2 F (36.8 C) 98.4 F (36.9 C)  TempSrc:   Oral Oral  SpO2: 97% 99% 100% 100%  Weight:      Height:       ROS negative except as per HPI.  Physical Exam  Constitutional: He appears well-developed and well-nourished. He does not appear ill.  Cardiovascular: Normal rate.  No murmur heard. Pulmonary/Chest: Effort normal. No tachypnea. He has decreased breath sounds in the right lower field and the left lower field. He has wheezes.  Abdominal: Soft. Bowel sounds are normal. He exhibits no distension. There is no tenderness.  Vitals reviewed.   Assessment/Plan:  Principal Problem:   COPD exacerbation (HCC) Active Problems:   Mild alcohol use disorder  COPD exacerbation: The patient was given a dose of Solu-Medrol, albuterol, Atrovent P in route with EMS the day of admission which did not significantly improve his symptoms. He was admitted for continued evaluation and treatment. -We will continue prednisone for atotal of a5-day course, on day three, can complete as outpatient. -We will initiate 8 azithromycin for 5-day course, on day three, can complete as outpatient -We will continue cardiac monitoring  --Will exercise with pulse oximetry in am for O2 status  --Patient's O2 sats >94% on 2L via nasal cannula  --Should be stable for discharge home today if he continues to improve  HTN: We will continue his home antihypertensive medications --Triamterene-HCTZ 37.5-25mg  --BP today is  132/75  Hypokalemia: History of hypokalemia. Etiology unclear --Will continue home K-dur 20mEq BID, potassium 4.4 the prior day, did not repeat  EtOH abuse: --Patient on CIWA protocol given his history of EtOH use. --Ativan PRN as per CIWA. 1mg  PO or IV PRN  --24oz beer daily as well  Diet: regular Code: Full Fluids: None at this time Dvt ppx: enoxaparin 40mg  Dispo: Anticipated discharge in approximately today.   Lanelle BalHarbrecht, Shanen Norris, MD 08/31/2017, 7:16 AM Pager: Pager# [phone]

## 2017-08-31 NOTE — Progress Notes (Signed)
  Date: 08/31/2017  Patient name: Ronald [name]  Medical record number: [id number]  Date of birth: 1956-03-22   I have seen and evaluated this patient and I have discussed the plan of care with the house staff. Please see Dr. Godfrey PickHarbrecht's note for complete details. I concur with his findings with the following additions/corrections:   Patient unable to use wheelchair for movement to assess O2 needs with movement at this time.  I think he likely will not need O2 at home, he certainly does not require it at rest.  Discharge today with plans to complete 5 day course of Azithromycin and prednisone at home.   Inez CatalinaMullen, Emily B, MD 08/31/2017, 1:22 PM

## 2017-08-31 NOTE — Progress Notes (Signed)
Brother here at this time, pt d/c'd via wheelchair with belongings with brother, escorted by unit NT.

## 2017-08-31 NOTE — Progress Notes (Signed)
PT is unable to see this pt for assessing oxygen requirement at a w/c level.  Will defer this task to nursing.  RN notified.  Acute PT will sign off at this time.  Thank you. 08/31/2017  Tilden BingKen Aradhana Gin, PT [phone]  (pager)

## 2017-08-31 NOTE — Discharge Summary (Signed)
Name: Ronald [name] MRN: [id number] DOB: 1956/05/30 61 y.o. PCP: Gwenyth Benderean, Eric L, MD  Date of Admission: 08/29/2017  8:23 AM Date of Discharge: 08/31/2017 Attending Physician: No att. providers found  Discharge Diagnosis: Principal Problem:   COPD exacerbation (HCC) Active Problems:   Mild alcohol use disorder   Discharge Medications: Allergies as of 08/31/2017   No Known Allergies     Medication List    STOP taking these medications   potassium chloride 10 MEQ tablet Commonly known as:  K-DUR     TAKE these medications   azithromycin 250 MG tablet Commonly known as:  ZITHROMAX Take 1 tablet (250 mg total) by mouth daily.   folic acid 1 MG tablet Commonly known as:  FOLVITE Take 1 tablet (1 mg total) by mouth daily.   ibuprofen 200 MG tablet Commonly known as:  ADVIL,MOTRIN Take 400 mg by mouth every 6 (six) hours as needed for headache or mild pain.   multivitamin with minerals Tabs tablet Take 1 tablet by mouth daily.   potassium chloride SA 20 MEQ tablet Commonly known as:  K-DUR,KLOR-CON Take 1 tablet (20 mEq total) by mouth 2 (two) times daily.   predniSONE 20 MG tablet Commonly known as:  DELTASONE Take 2 tablets (40 mg total) by mouth daily.   thiamine 100 MG tablet Take 1 tablet (100 mg total) by mouth daily.   triamterene-hydrochlorothiazide 37.5-25 MG tablet Commonly known as:  MAXZIDE-25 Take 1 tablet by mouth daily.       Disposition and follow-up:   [name].  At the hospital follow up visit please address:  1.  The patient will require close follow-up with regard to his alcohol use disorder. In addition the patient will require close follow-up and PFTs for his probable COPD as although he has classic signs and symptoms as well as a prolonged smoking history without confirmatory PFTs on file.  Please evaluate the patient for resolution of his cough, sputum  production, and dyspnea.      Please consider discussing EtOH cessation with the patient as indicated.   2.  Labs / imaging needed at time of follow-up: PFT's if none are on file  3.  Pending labs/ test needing follow-up: n/a  Follow-up Appointments:   Hospital Course by problem list: Principal Problem:   COPD exacerbation (HCC) Active Problems:   Mild alcohol use disorder   1. COPD exacerbation: The patient presented with a 4-week history of cough, and one-week history of sputum production but denied fever or chills.  He has a past medical history is consistent with significant smoking but no previous formal COPD diagnosis but with a diagnosis of asthma.  Although this may have been severe exacerbation given his symptoms and presentation with a smoking history he was treated as a COPD exacerbation.  The patient was given a dose of Solu-Medrol, albuterol, Atrovent P in route with EMS the day of admission which did not significantly improve his symptoms. He was admitted for continued evaluation and treatment with a standard 5-day course of prednisone and azithromycin.  Patient was able to maintain oxygen saturation levels above 94% on room air prior to discharge.  2. HTN: We will continue his home antihypertensive medications of triamterene-HCTZ 37.5-25mg . His blood pressure remained nearly normal limits during the admission and was discharged home with most recent indicating 154/82 as he had been moving about prior to this recording.  We do not recommend any  changes to his antihypertensive medication regimen will defer such to his PCP.  3. Hypokalemia: History of hypokalemia. Etiology unclear.  We continued his home K-dur 20mEq BID, potassium during the admission and monitored his potassium for changes.  His potassium on discharge was 3.8 and as such he was sent home on his home potassium dose.  4. EtOH abuse: The patient attested to drinking 2x40 ounce beers per day every day and as  such was placed on CIWA protocol with Ativan and given 24 ounces of Coors Light daily to decrease cravings and withdrawal as he states he does not wish to stop drinking alcohol at this time thereby limiting the utility and effectiveness of a librium taper.   Discharge Vitals:   BP (!) 154/82 (BP Location: Right Arm)   Pulse 93   Temp 99.2 F (37.3 C) (Oral)   Resp 18   Ht 5\' 2"  (1.575 m)   Wt 147 lb 14.4 oz (67.1 kg)   SpO2 99%   BMI 27.05 kg/m   Pertinent Labs, Studies, and Procedures:  CBC Latest Ref Rng & Units 08/30/2017 08/29/2017 01/23/2017  WBC 4.0 - 10.5 K/uL 11.4(H) 16.0(H) 12.2(H)  Hemoglobin 13.0 - 17.0 g/dL 12.9(L) 13.9 14.8  Hematocrit 39.0 - 52.0 % 37.4(L) 39.8 40.6  Platelets 150 - 400 K/uL 375 354 268   CMP Latest Ref Rng & Units 08/31/2017 08/30/2017 08/29/2017  Glucose 65 - 99 mg/dL 93 161(W119(H) 960(A135(H)  BUN 6 - 20 mg/dL 9 12 <5(W<5(L)  Creatinine 0.61 - 1.24 mg/dL 0.980.77 1.190.72 1.47(W0.53(L)  Sodium 135 - 145 mmol/L 135 132(L) 132(L)  Potassium 3.5 - 5.1 mmol/L 3.8 4.4 3.5  Chloride 101 - 111 mmol/L 97(L) 94(L) 94(L)  CO2 22 - 32 mmol/L 29 29 27   Calcium 8.9 - 10.3 mg/dL 9.5 9.3 2.9(F8.5(L)  Total Protein 6.5 - 8.1 g/dL - - 7.0  Total Bilirubin 0.3 - 1.2 mg/dL - - 0.4  Alkaline Phos 38 - 126 U/L - - 78  AST 15 - 41 U/L - - 21  ALT 17 - 63 U/L - - 16(L)     Discharge Instructions: Discharge Instructions    Call MD for:  persistant dizziness or light-headedness   Complete by:  As directed    Call MD for:  temperature >100.4   Complete by:  As directed    Diet - low sodium heart healthy   Complete by:  As directed    Increase activity slowly   Complete by:  As directed       Signed: Lanelle BalHarbrecht, Marciano Mundt, MD 09/02/2017, 7:58 AM   Pager: Pager# [phone]

## 2017-08-31 NOTE — Progress Notes (Signed)
Discharge instructions reviewed with pt. Copy of instructions and scripts at pt's pharmacy.   pt waiting for his brother to come pick him up, pt has d/c instructions, IV out, waiting for ride.

## 2017-08-31 NOTE — Progress Notes (Signed)
Unit does not have a wheelchair that is compatible to one the pt uses at home in which he can propel himself in as does at home. Pt sats on RA 98% after sitting up on edge of bed and eating breakfast this am.

## 2019-03-17 ENCOUNTER — Other Ambulatory Visit: Payer: Self-pay

## 2019-03-17 ENCOUNTER — Emergency Department (HOSPITAL_COMMUNITY): Payer: Medicaid Other

## 2019-03-17 ENCOUNTER — Encounter (HOSPITAL_COMMUNITY): Payer: Self-pay | Admitting: Emergency Medicine

## 2019-03-17 ENCOUNTER — Inpatient Hospital Stay (HOSPITAL_COMMUNITY)
Admission: EM | Admit: 2019-03-17 | Discharge: 2019-03-26 | DRG: 100 | Disposition: A | Discharge status: Home Health | Payer: Medicaid Other | Attending: Family Medicine | Admitting: Family Medicine

## 2019-03-17 DIAGNOSIS — I10 Essential (primary) hypertension: Secondary | ICD-10-CM | POA: Diagnosis present

## 2019-03-17 DIAGNOSIS — I160 Hypertensive urgency: Secondary | ICD-10-CM

## 2019-03-17 DIAGNOSIS — J9600 Acute respiratory failure, unspecified whether with hypoxia or hypercapnia: Secondary | ICD-10-CM

## 2019-03-17 DIAGNOSIS — Z6825 Body mass index (BMI) 25.0-25.9, adult: Secondary | ICD-10-CM

## 2019-03-17 DIAGNOSIS — R471 Dysarthria and anarthria: Secondary | ICD-10-CM | POA: Diagnosis present

## 2019-03-17 DIAGNOSIS — G9340 Encephalopathy, unspecified: Secondary | ICD-10-CM | POA: Diagnosis not present

## 2019-03-17 DIAGNOSIS — R4182 Altered mental status, unspecified: Secondary | ICD-10-CM | POA: Diagnosis present

## 2019-03-17 DIAGNOSIS — R402222 Coma scale, best verbal response, incomprehensible words, at arrival to emergency department: Secondary | ICD-10-CM | POA: Diagnosis present

## 2019-03-17 DIAGNOSIS — J9601 Acute respiratory failure with hypoxia: Secondary | ICD-10-CM | POA: Diagnosis present

## 2019-03-17 DIAGNOSIS — J9602 Acute respiratory failure with hypercapnia: Secondary | ICD-10-CM | POA: Diagnosis present

## 2019-03-17 DIAGNOSIS — E876 Hypokalemia: Secondary | ICD-10-CM | POA: Diagnosis present

## 2019-03-17 DIAGNOSIS — R1312 Dysphagia, oropharyngeal phase: Secondary | ICD-10-CM | POA: Diagnosis present

## 2019-03-17 DIAGNOSIS — Z833 Family history of diabetes mellitus: Secondary | ICD-10-CM

## 2019-03-17 DIAGNOSIS — Z7952 Long term (current) use of systemic steroids: Secondary | ICD-10-CM

## 2019-03-17 DIAGNOSIS — I674 Hypertensive encephalopathy: Secondary | ICD-10-CM | POA: Diagnosis present

## 2019-03-17 DIAGNOSIS — Z0189 Encounter for other specified special examinations: Secondary | ICD-10-CM

## 2019-03-17 DIAGNOSIS — J449 Chronic obstructive pulmonary disease, unspecified: Secondary | ICD-10-CM | POA: Diagnosis present

## 2019-03-17 DIAGNOSIS — I739 Peripheral vascular disease, unspecified: Secondary | ICD-10-CM | POA: Diagnosis present

## 2019-03-17 DIAGNOSIS — Z89512 Acquired absence of left leg below knee: Secondary | ICD-10-CM

## 2019-03-17 DIAGNOSIS — Z811 Family history of alcohol abuse and dependence: Secondary | ICD-10-CM

## 2019-03-17 DIAGNOSIS — J13 Pneumonia due to Streptococcus pneumoniae: Secondary | ICD-10-CM | POA: Diagnosis present

## 2019-03-17 DIAGNOSIS — Z781 Physical restraint status: Secondary | ICD-10-CM

## 2019-03-17 DIAGNOSIS — I161 Hypertensive emergency: Secondary | ICD-10-CM | POA: Diagnosis present

## 2019-03-17 DIAGNOSIS — E440 Moderate protein-calorie malnutrition: Secondary | ICD-10-CM | POA: Diagnosis present

## 2019-03-17 DIAGNOSIS — F10239 Alcohol dependence with withdrawal, unspecified: Secondary | ICD-10-CM | POA: Diagnosis present

## 2019-03-17 DIAGNOSIS — I4510 Unspecified right bundle-branch block: Secondary | ICD-10-CM | POA: Diagnosis present

## 2019-03-17 DIAGNOSIS — R569 Unspecified convulsions: Principal | ICD-10-CM | POA: Diagnosis present

## 2019-03-17 DIAGNOSIS — I952 Hypotension due to drugs: Secondary | ICD-10-CM | POA: Diagnosis not present

## 2019-03-17 DIAGNOSIS — R402342 Coma scale, best motor response, flexion withdrawal, at arrival to emergency department: Secondary | ICD-10-CM | POA: Diagnosis present

## 2019-03-17 DIAGNOSIS — Z791 Long term (current) use of non-steroidal anti-inflammatories (NSAID): Secondary | ICD-10-CM

## 2019-03-17 DIAGNOSIS — T41295A Adverse effect of other general anesthetics, initial encounter: Secondary | ICD-10-CM | POA: Diagnosis not present

## 2019-03-17 DIAGNOSIS — R1310 Dysphagia, unspecified: Secondary | ICD-10-CM

## 2019-03-17 DIAGNOSIS — Z79899 Other long term (current) drug therapy: Secondary | ICD-10-CM

## 2019-03-17 DIAGNOSIS — F17210 Nicotine dependence, cigarettes, uncomplicated: Secondary | ICD-10-CM | POA: Diagnosis present

## 2019-03-17 DIAGNOSIS — E871 Hypo-osmolality and hyponatremia: Secondary | ICD-10-CM | POA: Diagnosis present

## 2019-03-17 DIAGNOSIS — F1490 Cocaine use, unspecified, uncomplicated: Secondary | ICD-10-CM | POA: Diagnosis present

## 2019-03-17 DIAGNOSIS — Z20828 Contact with and (suspected) exposure to other viral communicable diseases: Secondary | ICD-10-CM | POA: Diagnosis present

## 2019-03-17 DIAGNOSIS — Y92230 Patient room in hospital as the place of occurrence of the external cause: Secondary | ICD-10-CM | POA: Diagnosis not present

## 2019-03-17 DIAGNOSIS — M109 Gout, unspecified: Secondary | ICD-10-CM | POA: Diagnosis present

## 2019-03-17 LAB — CBC
HCT: 37.7 % — ABNORMAL LOW (ref 39.0–52.0)
Hemoglobin: 13.9 g/dL (ref 13.0–17.0)
MCH: 33.7 pg (ref 26.0–34.0)
MCHC: 36.9 g/dL — ABNORMAL HIGH (ref 30.0–36.0)
MCV: 91.3 fL (ref 80.0–100.0)
Platelets: 291 10*3/uL (ref 150–400)
RBC: 4.13 MIL/uL — ABNORMAL LOW (ref 4.22–5.81)
RDW: 12.4 % (ref 11.5–15.5)
WBC: 13.3 10*3/uL — ABNORMAL HIGH (ref 4.0–10.5)
nRBC: 0 % (ref 0.0–0.2)

## 2019-03-17 LAB — COMPREHENSIVE METABOLIC PANEL
ALT: 26 U/L (ref 0–44)
AST: 32 U/L (ref 15–41)
Albumin: 4.8 g/dL (ref 3.5–5.0)
Alkaline Phosphatase: 57 U/L (ref 38–126)
Anion gap: 16 — ABNORMAL HIGH (ref 5–15)
BUN: 10 mg/dL (ref 8–23)
CO2: 22 mmol/L (ref 22–32)
Calcium: 9.6 mg/dL (ref 8.9–10.3)
Chloride: 82 mmol/L — ABNORMAL LOW (ref 98–111)
Creatinine, Ser: 0.88 mg/dL (ref 0.61–1.24)
GFR calc Af Amer: >60 mL/min (ref >60)
GFR calc non Af Amer: >60 mL/min (ref >60)
Glucose, Bld: 105 mg/dL — ABNORMAL HIGH (ref 70–99)
Potassium: 3.7 mmol/L (ref 3.5–5.1)
Sodium: 120 mmol/L — ABNORMAL LOW (ref 135–145)
Total Bilirubin: 1.8 mg/dL — ABNORMAL HIGH (ref 0.3–1.2)
Total Protein: 8.2 g/dL — ABNORMAL HIGH (ref 6.5–8.1)

## 2019-03-17 LAB — APTT: aPTT: 31 seconds (ref 24–36)

## 2019-03-17 LAB — DIFFERENTIAL
Abs Immature Granulocytes: 0.08 10*3/uL — ABNORMAL HIGH (ref 0.00–0.07)
Basophils Absolute: 0 10*3/uL (ref 0.0–0.1)
Basophils Relative: 0 %
Eosinophils Absolute: 0 10*3/uL (ref 0.0–0.5)
Eosinophils Relative: 0 %
Immature Granulocytes: 1 %
Lymphocytes Relative: 8 %
Lymphs Abs: 1.1 10*3/uL (ref 0.7–4.0)
Monocytes Absolute: 1 10*3/uL (ref 0.1–1.0)
Monocytes Relative: 8 %
Neutro Abs: 11.1 10*3/uL — ABNORMAL HIGH (ref 1.7–7.7)
Neutrophils Relative %: 83 %

## 2019-03-17 LAB — ETHANOL: Alcohol, Ethyl (B): <10 mg/dL (ref <10)

## 2019-03-17 LAB — I-STAT CHEM 8, ED
BUN: 10 mg/dL (ref 8–23)
Calcium, Ion: 1.1 mmol/L — ABNORMAL LOW (ref 1.15–1.40)
Chloride: 83 mmol/L — ABNORMAL LOW (ref 98–111)
Creatinine, Ser: 0.6 mg/dL — ABNORMAL LOW (ref 0.61–1.24)
Glucose, Bld: 105 mg/dL — ABNORMAL HIGH (ref 70–99)
HCT: 45 % (ref 39.0–52.0)
Hemoglobin: 15.3 g/dL (ref 13.0–17.0)
Potassium: 3.7 mmol/L (ref 3.5–5.1)
Sodium: 120 mmol/L — ABNORMAL LOW (ref 135–145)
TCO2: 23 mmol/L (ref 22–32)

## 2019-03-17 LAB — PROTIME-INR
INR: 1.1 (ref 0.8–1.2)
Prothrombin Time: 13.6 seconds (ref 11.4–15.2)

## 2019-03-17 LAB — CBG MONITORING, ED: Glucose-Capillary: 104 mg/dL — ABNORMAL HIGH (ref 70–99)

## 2019-03-17 LAB — CK: Total CK: 341 U/L (ref 49–397)

## 2019-03-17 LAB — MAGNESIUM: Magnesium: 1.8 mg/dL (ref 1.7–2.4)

## 2019-03-17 MED ORDER — IOHEXOL 350 MG/ML SOLN
125.0000 mL | Freq: Once | INTRAVENOUS | Status: AC | PRN
  Administered 2019-03-17: 125 mL via INTRAVENOUS

## 2019-03-17 MED ORDER — SUCCINYLCHOLINE CHLORIDE 20 MG/ML IJ SOLN
1.5000 mg/kg | Freq: Once | INTRAMUSCULAR | Status: DC
  Administered 2019-03-17: 70 mg via INTRAVENOUS

## 2019-03-17 MED ORDER — LEVETIRACETAM IN NACL 1000 MG/100ML IV SOLN
1000.0000 mg | Freq: Once | INTRAVENOUS | Status: AC
  Administered 2019-03-17: 1000 mg via INTRAVENOUS
  Filled 2019-03-17: qty 100

## 2019-03-17 MED ORDER — LORAZEPAM 2 MG/ML IJ SOLN
2.0000 mg | Freq: Once | INTRAMUSCULAR | Status: AC
  Administered 2019-03-17: 2 mg via INTRAVENOUS

## 2019-03-17 MED ORDER — CLEVIDIPINE BUTYRATE 0.5 MG/ML IV EMUL
0.0000 mg/h | INTRAVENOUS | Status: DC
  Filled 2019-03-17: qty 50

## 2019-03-17 MED ORDER — LORAZEPAM 2 MG/ML IJ SOLN
INTRAMUSCULAR | Status: AC
  Administered 2019-03-17: 2 mg via INTRAVENOUS
  Filled 2019-03-17: qty 1

## 2019-03-17 MED ORDER — LORAZEPAM 2 MG/ML IJ SOLN
1.0000 mg | Freq: Once | INTRAMUSCULAR | Status: AC
  Administered 2019-03-17: 1 mg via INTRAVENOUS

## 2019-03-17 MED ORDER — SODIUM CHLORIDE 0.9% FLUSH: 3.0000 mL | Freq: Once | INTRAVENOUS | Status: DC

## 2019-03-17 MED ORDER — LORAZEPAM 2 MG/ML IJ SOLN
INTRAMUSCULAR | Status: AC
  Administered 2019-03-17: 23:00:00 1 mg via INTRAVENOUS
  Filled 2019-03-17: qty 1

## 2019-03-17 MED ORDER — PROPOFOL 1000 MG/100ML IV EMUL
INTRAVENOUS | Status: AC
  Filled 2019-03-17: qty 100

## 2019-03-17 MED ORDER — ETOMIDATE 2 MG/ML IV SOLN
0.3000 mg/kg | Freq: Once | INTRAVENOUS | Status: DC
  Administered 2019-03-17: 20 mg via INTRAVENOUS

## 2019-03-17 MED ORDER — PROPOFOL 1000 MG/100ML IV EMUL
5.0000 ug/kg/min | INTRAVENOUS | Status: DC
  Administered 2019-03-18: 30 ug/kg/min via INTRAVENOUS

## 2019-03-17 NOTE — ED Notes (Signed)
[place]  [phone]

## 2019-03-17 NOTE — ED Provider Notes (Signed)
[facility] EMERGENCY DEPARTMENT Provider Note   CSN: [id number] Arrival date & time: 03/17/19  2207    History   Chief Complaint Chief Complaint  Patient presents with   Code Stroke    HPI Ronald [name] is a 63 y.o. male with a hx of Left BKA, alcoholism, HTN, COPD, subdural hematoma presents to the Emergency Department via EMS as a code stroke with AMS onset sometimes yesterday afternoon.  5 caveat for altered mental status.  EMS, patient last seen well before 11 PM yesterday.  At that time he was noted by a friend to be running into walls and seemingly incoherent.  Dr. Malen Gauze spoke with patient's nephew who reports that he checked on the patient yesterday afternoon and the patient was altered at that time.  They left patient at his home and returned back to check on him tonight, to find him persistently altered prompting the call to 911.  Patient was last seen well more than 24 hours ago.  Per EMS, pt diaphoretic, altered, posturing with left arm only, not following commands and left gaze deviation on their arrival.  Code stroke activated.          The history is provided by the EMS personnel and a relative. The history is limited by the condition of the patient. No language interpreter was used.    Past Medical History:  Diagnosis Date   Critical lower limb ischemia    ETOH abuse    Gout    Hypertension     Patient Active Problem List   Diagnosis Date Noted   COPD exacerbation ([place]) 08/29/2017   Subdural hematoma ([facility]) 06/19/2014   Critical lower limb ischemia 12/25/2013   Osteomyelitis ([place]) 11/06/2013   HTN (hypertension), benign 11/06/2013   Mild alcohol use disorder 11/06/2013   Osteomyelitis of left foot ([place]) 11/06/2013   COPD (chronic obstructive pulmonary disease) ([place]) 11/06/2013   Accelerated hypertension 09/25/2013   Hypokalemia 09/25/2013   Abscess of left foot 09/21/2013   Cellulitis of left foot 09/19/2013     Past Surgical History:  Procedure Laterality Date   ABDOMINAL AORTAGRAM N/A 12/26/2013   Procedure: ABDOMINAL Maxcine Ham;  Surgeon: Serafina Mitchell, MD;  Location: [facility] CATH LAB;  Service: Cardiovascular;  Laterality: N/A;   AMPUTATION Left 12/28/2013   Procedure: LEFT BELOW KNEE AMPUTATION;  Surgeon: Newt Minion, MD;  Location: [facility];  Service: Orthopedics;  Laterality: Left;   BELOW KNEE LEG AMPUTATION     Left   LEG SURGERY          Home Medications    Prior to Admission medications   Medication Sig Start Date End Date Taking? Authorizing Provider  azithromycin (ZITHROMAX) 250 MG tablet Take 1 tablet (250 mg total) by mouth daily. 09/01/17   Valinda Party, DO  folic acid (FOLVITE) 1 MG tablet Take 1 tablet (1 mg total) by mouth daily. Patient not taking: Reported on 08/29/2017 06/19/14   Cristal Ford, DO  ibuprofen (ADVIL,MOTRIN) 200 MG tablet Take 400 mg by mouth every 6 (six) hours as needed for headache or mild pain.    [provider]  Multiple Vitamin (MULTIVITAMIN WITH MINERALS) TABS tablet Take 1 tablet by mouth daily. Patient not taking: Reported on 01/23/2017 06/19/14   Cristal Ford, DO  potassium chloride SA (K-DUR,KLOR-CON) 20 MEQ tablet Take 1 tablet (20 mEq total) by mouth 2 (two) times daily. Patient not taking: Reported on 08/29/2017 01/23/17   Charlann Lange, PA-C  predniSONE (DELTASONE)  20 MG tablet Take 2 tablets (40 mg total) by mouth daily. 09/01/17   Kalman Shan Ratliff, DO  thiamine 100 MG tablet Take 1 tablet (100 mg total) by mouth daily. Patient not taking: Reported on 01/23/2017 06/19/14   Cristal Ford, DO  triamterene-hydrochlorothiazide (MAXZIDE-25) 37.5-25 MG per tablet Take 1 tablet by mouth daily. 09/26/13   Eugenie Filler, MD    Family History Family History  Problem Relation Age of Onset   Diabetes Mellitus II Mother    CAD Neg Hx    Stroke Neg Hx     Social History Social History   Tobacco Use    Smoking status: Current Every Day Smoker    Packs/day: 0.50    Years: 40.00    Pack years: 20.00    Types: Cigarettes   Smokeless tobacco: Never Used  Substance Use Topics   Alcohol use: Yes    Alcohol/week: 120.0 standard drinks    Types: 120 Cans of beer per week    Comment: 2 -3 40 oz beers per day   Drug use: Yes    Types: Cocaine, Marijuana    Comment: crack     Allergies   Patient has no known allergies.   Review of Systems Review of Systems  Unable to perform ROS: Mental status change     Physical Exam Updated Vital Signs BP 136/84    Pulse (!) 127    Temp (!) 102 F (38.9 C) (Rectal)    Resp 16    SpO2 100%   Physical Exam Vitals signs and nursing note reviewed.  Constitutional:      General: He is in acute distress.     Appearance: He is diaphoretic.     Comments: Dysarthric, agitated, will not follow commands  HENT:     Head: Normocephalic.  Eyes:     General: No scleral icterus.    Conjunctiva/sclera: Conjunctivae normal.     Comments: Left gaze deviation  Neck:     Musculoskeletal: Normal range of motion.     Comments: Fully ranging neck Cardiovascular:     Rate and Rhythm: Regular rhythm. Tachycardia present.     Pulses: Normal pulses.          Radial pulses are 2+ on the right side and 2+ on the left side.  Pulmonary:     Effort: Tachypnea present. No accessory muscle usage, prolonged expiration, respiratory distress or retractions.     Breath sounds: No stridor.     Comments: Equal chest rise. Coughing with thick white sputum Abdominal:     General: There is no distension.     Palpations: Abdomen is soft.     Tenderness: There is no abdominal tenderness.  Musculoskeletal:     Comments: Moves all extremities equally.  Left BKA.    Skin:    General: Skin is cool.     Capillary Refill: Capillary refill takes less than 2 seconds.     Coloration: Skin is not cyanotic.  Neurological:     Mental Status: He is confused.     GCS: GCS  eye subscore is 4. GCS verbal subscore is 2. GCS motor subscore is 4.     Comments: Dysarthric, does not follow commands, agitated      ED Treatments / Results  Labs (all labs ordered are listed, but only abnormal results are displayed) Labs Reviewed  CBC - Abnormal; Notable for the following components:      Result Value   WBC 13.3 (*)  RBC 4.13 (*)    HCT 37.7 (*)    MCHC 36.9 (*)    All other components within normal limits  DIFFERENTIAL - Abnormal; Notable for the following components:   Neutro Abs 11.1 (*)    Abs Immature Granulocytes 0.08 (*)    All other components within normal limits  COMPREHENSIVE METABOLIC PANEL - Abnormal; Notable for the following components:   Sodium 120 (*)    Chloride 82 (*)    Glucose, Bld 105 (*)    Total Protein 8.2 (*)    Total Bilirubin 1.8 (*)    Anion gap 16 (*)    All other components within normal limits  I-STAT CHEM 8, ED - Abnormal; Notable for the following components:   Sodium 120 (*)    Chloride 83 (*)    Creatinine, Ser 0.60 (*)    Glucose, Bld 105 (*)    Calcium, Ion 1.10 (*)    All other components within normal limits  CBG MONITORING, ED - Abnormal; Notable for the following components:   Glucose-Capillary 104 (*)    All other components within normal limits  POCT I-STAT 7, (LYTES, BLD GAS, ICA,H+H) - Abnormal; Notable for the following components:   pH, Arterial 7.308 (*)    pCO2 arterial 50.1 (*)    pO2, Arterial 329.0 (*)    Sodium 120 (*)    HCT 38.0 (*)    Hemoglobin 12.9 (*)    All other components within normal limits  SARS CORONAVIRUS 2 (HOSPITAL ORDER, [facility])  PROTIME-INR  APTT  CK  ETHANOL  MAGNESIUM  LACTIC ACID, PLASMA  TROPONIN I (HIGH SENSITIVITY)  RAPID URINE DRUG SCREEN, HOSP PERFORMED  URINALYSIS, ROUTINE W REFLEX MICROSCOPIC  LACTIC ACID, PLASMA  TROPONIN I (HIGH SENSITIVITY)    EKG EKG Interpretation  Date/Time:  Saturday March 17 2019 23:02:47  EDT Ventricular Rate:  119 PR Interval:    QRS Duration: 127 QT Interval:  333 QTC Calculation: 469 R Axis:   -73 Text Interpretation:  Sinus tachycardia Probable left atrial enlargement RBBB and LAFB Bundle appears new in comparison to prior Confirmed by Gareth Morgan [phone]) on 03/17/2019 11:12:02 PM   Radiology Ct Angio Head W Or Wo Contrast  Result Date: 03/17/2019 CLINICAL DATA:  63 y/o  M; stroke follow-up. Left-sided weakness. EXAM: CT ANGIOGRAPHY HEAD AND NECK CT PERFUSION BRAIN TECHNIQUE: Multidetector CT imaging of the head and neck was performed using the standard protocol during bolus administration of intravenous contrast. Multiplanar CT image reconstructions and MIPs were obtained to evaluate the vascular anatomy. Carotid stenosis measurements (when applicable) are obtained utilizing NASCET criteria, using the distal internal carotid diameter as the denominator. Multiphase CT imaging of the brain was performed following IV bolus contrast injection. Subsequent parametric perfusion maps were calculated using RAPID software. CONTRAST:  115 cc Omnipaque 350 COMPARISON:  03/17/2019 CT head. FINDINGS: CTA NECK FINDINGS Aortic arch: Standard branching. Imaged portion shows no evidence of aneurysm or dissection. No significant stenosis of the major arch vessel origins. Mild calcific atherosclerosis. Right carotid system: No evidence of dissection, stenosis (50% or greater) or occlusion. Mild non stenotic calcific atherosclerosis of carotid bifurcation. Left carotid system: No evidence of dissection, stenosis (50% or greater) or occlusion. Mild non stenotic calcific atherosclerosis of carotid bifurcation. Vertebral arteries: Left dominant. No evidence of dissection, stenosis (50% or greater) or occlusion. Skeleton: No acute finding. Other neck: Negative. Upper chest: Centrilobular emphysema. Review of the MIP images confirms the above  findings CTA HEAD FINDINGS Anterior circulation: No significant  stenosis, proximal occlusion, or aneurysm. Mild non stenotic calcific atherosclerosis of internal carotid arteries. There is a prominent cluster of vessels overlying the right sylvian fissure spanning 20 x 7 x 7 mm (AP by ML series 10, image 16 and series 9, image 41) as well as mildly increased enhancement of the right MCA distribution in comparison with the left. Posterior circulation: No significant stenosis, proximal occlusion, aneurysm, or vascular malformation. Left dominant vertebrobasilar system. Mild non stenotic calcific atherosclerosis of basilar artery and vertebral artery. Venous sinuses: As permitted by contrast timing, patent. Anatomic variants: None significant. Review of the MIP images confirms the above findings CT Brain Perfusion Findings: ASPECTS: 10 CBF (<30%) Volume: 62m Perfusion (Tmax>6.0s) volume: 0171mMismatch Volume: 71m371mnfarction Location:Not applicable. Motion artifact. IMPRESSION: CT brain perfusion: 1. Negative CT brain perfusion.  Mild motion artifact. CTA neck: 1. Patent carotid and vertebral arteries. No dissection, aneurysm, or hemodynamically significant stenosis utilizing NASCET criteria. 2. Centrilobular emphysema of lung apices. 3. Mild calcific atherosclerosis of aortic arch, carotid bifurcations, and vertebral arteries. CTA head: 1. Patent anterior and posterior intracranial circulation. No large vessel occlusion, aneurysm, or significant stenosis. 2. 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 These results were called by telephone at the time of interpretation on 03/17/2019 at 11:11 pm to Dr. ASHAmie Portlandwho verbally acknowledged these results. Electronically Signed   By: LanKristine GarbeD.   On: 03/17/2019 23:19   Ct Angio Neck W Or Wo Contrast  Result Date: 03/17/2019 CLINICAL DATA:  [age]o  M; stroke follow-up. Left-sided weakness.  EXAM: CT ANGIOGRAPHY HEAD AND NECK CT PERFUSION BRAIN TECHNIQUE: Multidetector CT imaging of the head and neck was performed using the standard protocol during bolus administration of intravenous contrast. Multiplanar CT image reconstructions and MIPs were obtained to evaluate the vascular anatomy. Carotid stenosis measurements (when applicable) are obtained utilizing NASCET criteria, using the distal internal carotid diameter as the denominator. Multiphase CT imaging of the brain was performed following IV bolus contrast injection. Subsequent parametric perfusion maps were calculated using RAPID software. CONTRAST:  115 cc Omnipaque 350 COMPARISON:  03/17/2019 CT head. FINDINGS: CTA NECK FINDINGS Aortic arch: Standard branching. Imaged portion shows no evidence of aneurysm or dissection. No significant stenosis of the major arch vessel origins. Mild calcific atherosclerosis. Right carotid system: No evidence of dissection, stenosis (50% or greater) or occlusion. Mild non stenotic calcific atherosclerosis of carotid bifurcation. Left carotid system: No evidence of dissection, stenosis (50% or greater) or occlusion. Mild non stenotic calcific atherosclerosis of carotid bifurcation. Vertebral arteries: Left dominant. No evidence of dissection, stenosis (50% or greater) or occlusion. Skeleton: No acute finding. Other neck: Negative. Upper chest: Centrilobular emphysema. Review of the MIP images confirms the above findings CTA HEAD FINDINGS Anterior circulation: No significant stenosis, proximal occlusion, or aneurysm. Mild non stenotic calcific atherosclerosis of internal carotid arteries. There is a prominent cluster of vessels overlying the right sylvian fissure spanning 20 x 7 x 7 mm (AP by ML series 10, image 16 and series 9, image 41) as well as mildly increased enhancement of the right MCA distribution in comparison with the left. Posterior circulation: No significant stenosis, proximal occlusion, aneurysm, or  vascular malformation. Left dominant vertebrobasilar system. Mild non stenotic calcific atherosclerosis of basilar artery and vertebral artery. Venous sinuses: As permitted by contrast timing, patent. Anatomic variants:  None significant. Review of the MIP images confirms the above findings CT Brain Perfusion Findings: ASPECTS: 10 CBF (<30%) Volume: 72m Perfusion (Tmax>6.0s) volume: 054mMismatch Volume: 45m37mnfarction Location:Not applicable. Motion artifact. IMPRESSION: CT brain perfusion: 1. Negative CT brain perfusion.  Mild motion artifact. CTA neck: 1. Patent carotid and vertebral arteries. No dissection, aneurysm, or hemodynamically significant stenosis utilizing NASCET criteria. 2. Centrilobular emphysema of lung apices. 3. Mild calcific atherosclerosis of aortic arch, carotid bifurcations, and vertebral arteries. CTA head: 1. Patent anterior and posterior intracranial circulation. No large vessel occlusion, aneurysm, or significant stenosis. 2. 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 These results were called by telephone at the time of interpretation on 03/17/2019 at 11:11 pm to Dr. ASHAmie Portlandwho verbally acknowledged these results. Electronically Signed   By: LanKristine GarbeD.   On: 03/17/2019 23:19   Ct Cerebral Perfusion W Contrast  Result Date: 03/17/2019 CLINICAL DATA:  62 52o  M; stroke follow-up. Left-sided weakness. EXAM: CT ANGIOGRAPHY HEAD AND NECK CT PERFUSION BRAIN TECHNIQUE: Multidetector CT imaging of the head and neck was performed using the standard protocol during bolus administration of intravenous contrast. Multiplanar CT image reconstructions and MIPs were obtained to evaluate the vascular anatomy. Carotid stenosis measurements (when applicable) are obtained utilizing NASCET criteria, using the distal internal carotid diameter as the  denominator. Multiphase CT imaging of the brain was performed following IV bolus contrast injection. Subsequent parametric perfusion maps were calculated using RAPID software. CONTRAST:  115 cc Omnipaque 350 COMPARISON:  03/17/2019 CT head. FINDINGS: CTA NECK FINDINGS Aortic arch: Standard branching. Imaged portion shows no evidence of aneurysm or dissection. No significant stenosis of the major arch vessel origins. Mild calcific atherosclerosis. Right carotid system: No evidence of dissection, stenosis (50% or greater) or occlusion. Mild non stenotic calcific atherosclerosis of carotid bifurcation. Left carotid system: No evidence of dissection, stenosis (50% or greater) or occlusion. Mild non stenotic calcific atherosclerosis of carotid bifurcation. Vertebral arteries: Left dominant. No evidence of dissection, stenosis (50% or greater) or occlusion. Skeleton: No acute finding. Other neck: Negative. Upper chest: Centrilobular emphysema. Review of the MIP images confirms the above findings CTA HEAD FINDINGS Anterior circulation: No significant stenosis, proximal occlusion, or aneurysm. Mild non stenotic calcific atherosclerosis of internal carotid arteries. There is a prominent cluster of vessels overlying the right sylvian fissure spanning 20 x 7 x 7 mm (AP by ML series 10, image 16 and series 9, image 41) as well as mildly increased enhancement of the right MCA distribution in comparison with the left. Posterior circulation: No significant stenosis, proximal occlusion, aneurysm, or vascular malformation. Left dominant vertebrobasilar system. Mild non stenotic calcific atherosclerosis of basilar artery and vertebral artery. Venous sinuses: As permitted by contrast timing, patent. Anatomic variants: None significant. Review of the MIP images confirms the above findings CT Brain Perfusion Findings: ASPECTS: 10 CBF (<30%) Volume: 45mL74mrfusion (Tmax>6.0s) volume: 45mL 71mmatch Volume: 45mL I245mrction Location:Not  applicable. Motion artifact. IMPRESSION: CT brain perfusion: 1. Negative CT brain perfusion.  Mild motion artifact. CTA neck: 1. Patent carotid and vertebral arteries. No dissection, aneurysm, or hemodynamically significant stenosis utilizing NASCET criteria. 2. Centrilobular emphysema of lung apices. 3. Mild calcific atherosclerosis of aortic arch, carotid bifurcations, and vertebral arteries. CTA head: 1. Patent anterior and posterior intracranial circulation. No large vessel occlusion, aneurysm, or significant stenosis. 2. Prominent cluster of vessels spanning 2 cm overlying  right Sylvian fissure and asymmetric increased enhancement of right MCA distribution suspicious for dural AV fistula. These results were called by telephone at the time of interpretation on 03/17/2019 at 11:11 pm to Dr. Amie Portland , who verbally acknowledged these results. Electronically Signed   By: Kristine Garbe M.D.   On: 03/17/2019 23:19   Dg Chest Portable 1 View  Result Date: 03/18/2019 CLINICAL DATA:  Intubated EXAM: PORTABLE CHEST 1 VIEW COMPARISON:  03/17/2019, 08/29/2017 FINDINGS: Endotracheal tube has been placed, the tip is about 3.3 cm superior to the carina. Esophageal tube tip below the diaphragm but incompletely imaged. No focal airspace disease or effusion. Normal heart size. No pneumothorax IMPRESSION: 1. Endotracheal tube tip about 3.3 cm superior to carina 2. Esophageal tube tip below the diaphragm but incompletely visualized Electronically Signed   By: Donavan Foil M.D.   On: 03/18/2019 00:26   Dg Chest Port 1 View  Result Date: 03/17/2019 CLINICAL DATA:  Aphasia.  Acute mental status change. EXAM: PORTABLE CHEST 1 VIEW COMPARISON:  August 29, 2017 FINDINGS: The heart size and mediastinal contours are within normal limits. Both lungs are clear. The visualized skeletal structures are unremarkable. IMPRESSION: No active disease. Electronically Signed   By: Dorise Bullion III M.D   On: 03/17/2019 23:26     Ct Head Code Stroke Wo Contrast  Result Date: 03/17/2019 CLINICAL DATA:  Code stroke. 64 year old male with left side weakness last seen normal 2200 hours. EXAM: CT HEAD WITHOUT CONTRAST TECHNIQUE: Contiguous axial images were obtained from the base of the skull through the vertex without intravenous contrast. COMPARISON:  Head CT 06/19/2014. FINDINGS: Study is intermittently degraded by motion artifact despite repeated imaging attempts. Brain: Generalized cerebral volume loss since 2015. Chronic bilateral basal ganglia infarcts appear stable. Progressed and now confluent bilateral cerebral white matter hypodensity. A small left thalamic lacunar infarct also appears stable. No midline shift, mass effect, or evidence of intracranial mass lesion. No acute intracranial hemorrhage identified. No cortically based acute infarct identified. Vascular: Calcified atherosclerosis at the skull base. No suspicious intracranial vascular hyperdensity. Skull: Motion artifact at the skull base. No acute osseous abnormality identified. Sinuses/Orbits: Paranasal sinuses and mastoids appear clear today. Other: No acute orbit or scalp soft tissue abnormality identified. ASPECTS [facility] Stroke Program Early CT Score) Total score (0-10 with 10 being normal): 10 IMPRESSION: 1. Motion degraded exam with no acute cortically based infarct or acute intracranial hemorrhage identified. 2. ASPECTS is 10. 3. Chronic small vessel disease with progression in the cerebral white matter since 2015. 4. These results were communicated to Dr. Rory Percy at 10:41 pm on 7/4/2020by text page via the [facility] messaging system. Electronically Signed   By: Genevie Ann M.D.   On: 03/17/2019 22:41    Procedures Procedure Name: Intubation Date/Time: 03/18/2019 12:57 AM Performed by: Abigail Butts, PA-C Pre-anesthesia Checklist: Patient identified, Emergency Drugs available, Suction available, Patient being monitored and Timeout performed Oxygen Delivery  Method: Non-rebreather mask Preoxygenation: Pre-oxygenation with 100% oxygen Induction Type: Rapid sequence Laryngoscope Size: Mac and 3 Tube size: 7.5 mm Number of attempts: 1 Airway Equipment and Method: Video-laryngoscopy Placement Confirmation: ETT inserted through vocal cords under direct vision and Breath sounds checked- equal and bilateral Secured at: 25 cm Tube secured with: ETT holder Dental Injury: Teeth and Oropharynx as per pre-operative assessment     .Critical Care Performed by: Abigail Butts, PA-C Authorized by: Abigail Butts, PA-C   Critical care provider statement:    Critical care time (minutes):  80  Critical care was necessary to treat or prevent imminent or life-threatening deterioration of the following conditions:  Circulatory failure, CNS failure or compromise and respiratory failure   Critical care was time spent personally by me on the following activities:  Discussions with consultants, evaluation of patient's response to treatment, examination of patient, ordering and performing treatments and interventions, ordering and review of laboratory studies, ordering and review of radiographic studies, pulse oximetry, re-evaluation of patient's condition, obtaining history from patient or surrogate and review of old charts   I assumed direction of critical care for this patient from another provider in my specialty: no     (including critical care time)  Medications Ordered in ED Medications  sodium chloride flush (NS) 0.9 % injection 3 mL (has no administration in time range)  propofol (DIPRIVAN) 1000 MG/100ML infusion (has no administration in time range)  propofol (DIPRIVAN) 1000 MG/100ML infusion (has no administration in time range)  clevidipine (CLEVIPREX) infusion 0.5 mg/mL (has no administration in time range)  fentaNYL (SUBLIMAZE) 100 MCG/2ML injection (has no administration in time range)  acetaminophen (TYLENOL) suppository 650 mg (has no  administration in time range)  thiamine (B-1) injection 100 mg (has no administration in time range)  iohexol (OMNIPAQUE) 350 MG/ML injection 125 mL (125 mLs Intravenous Contrast Given 03/17/19 2236)  LORazepam (ATIVAN) injection 2 mg (2 mg Intravenous Given 03/17/19 1026)  LORazepam (ATIVAN) injection 1 mg (1 mg Intravenous Given 03/17/19 2230)  levETIRAcetam (KEPPRA) IVPB 1000 mg/100 mL premix (0 mg Intravenous Stopped 03/17/19 2354)  etomidate (AMIDATE) injection 0.3 mg/kg (20 mg Intravenous Given 03/17/19 2345)  succinylcholine (ANECTINE) injection 1.5 mg/kg (70 mg Intravenous Given 03/17/19 2347)     Initial Impression / Assessment and Plan / ED Course  I have reviewed the triage vital signs and the nursing notes.  Pertinent labs & imaging results that were available during my care of the patient were reviewed by me and considered in my medical decision making (see chart for details).  Clinical Course as of Mar 18 123  Sun Mar 18, 2019  0113 Troponin I (High Sensitivity): 13 [HM]  0113 Sodium(!): 120 [HM]  0113 Normal  CK Total: 341 [HM]  0113 Discussed situation with Mr. Scoggin - Pt's nephew and pastor.  No current POA.    Official Contact:  Ronald [name] - sister - [phone] Gwenlyn Saran - sister - [phone]   [HM]  0114 febrile  Temp(!): 102 F (38.9 C) [HM]  0114 Remains tachycardic  Pulse Rate(!): 127 [HM]  0114 Hypertensive  BP(!): 205/183 [HM]    Clinical Course User Index [HM] Anjeanette Petzold, Gwenlyn Perking        [facility] was evaluated in Emergency Department on 03/18/2019 for the symptoms described in the history of present illness. He was evaluated in the context of the global COVID-19 pandemic, which necessitated consideration that the patient might be at risk for infection with the SARS-CoV-2 virus that causes COVID-19. Institutional protocols and algorithms that pertain to the evaluation of patients at risk for COVID-19 are in a state of rapid change based on  information released by regulatory bodies including the CDC and federal and state organizations. These policies and algorithms were followed during the patient's care in the ED.  Patient presents with acute altered mental status and is critically ill.  He presents to the emergency department as a code stroke.  Dr. Malen Gauze and I at bedside upon arrival.  Patient is acutely altered and agitated.  3 mg of Ativan  given in CT.  CT/CT perfusion without acute AAA however concern for subtle CVA versus hypertensive crisis versus encephalopathy is alcohol withdrawal.  Patient will have MRI.  Will treat as hypertensive crisis with Cleviprex.  EKG with new right bundle branch block on EKG, normal troponin.  Doubt ACS.  Also with hyponatremia at 120.  Concerned this may be causing some of patient's altered mental status.  Suspect likely seizure.  No oral trauma noted.  Unable to continue to protect his airway with snoring respirations and coughing thick sputum.  Given persistently altered mental status and inability to protect his airway, patient was intubated.  Will need ICU admission.   Pt's had return to midline prior to intubation.  Less likely status epilepticus.  Now found to be febrile.  Less likely Acetaminophen rectally administered.  Normal LFTs.  Alcohol level less than 0 and patient has long history of alcoholism.  Concern for alcohol withdrawal.  Thiamin given.  CK is within normal limits and creatinine within normal limits.  No evidence of rhabdomyolysis.  The patient was discussed with and evaluated by Dr. Stark Jock who was present for the intubation and agrees with the treatment plan.  Discussed with PCCM who will admit.     Final Clinical Impressions(s) / ED Diagnoses   Final diagnoses:  Altered mental status, unspecified altered mental status type  Hyponatremia  Hypertensive urgency  Family hx of alcoholism    ED Discharge Orders    None       Hillary Struss, Gwenlyn Perking 03/18/19 0126      Veryl Speak, MD 03/18/19 [phone]

## 2019-03-17 NOTE — Consult Note (Signed)
Neurology Consultation  Reason for Consult: Code stroke Referring Physician: Dr. Stark Jock  CC: Left-sided weakness, altered mental status, slurred speech  History is obtained from: Chart, nephew over the phone  HPI: [name] is a 63 y.o. male who has a past medical history of alcohol abuse, cocaine abuse, left BKA, hypertension, COPD, subdural hematoma many years ago, brought into the emergency room as an acute code stroke via [place], when an acquaintance called them for altered mental status. The patient was last seen or checked on sometime in the afternoon of 03/16/2019 when he was not acting right but the family did not make much of it and decided to check on him the next day. On 03/17/2019, someone checked on him and found him to be laying in his own urine, less responsive, mumbling words, for which they called EMS. The nephew who I spoke to over the phone, is the only family member in town but does not know much of the history. Code stroke was activated because to the EMS, the last known normal provided was 11 PM on 03/16/2019 whereas the nephew said that he had been acting not like himself all day on 03/16/2019. Patient had no seizure-like activity for EMS.  He was found covered in his own urine and appeared confused to them. The activated LVO positive code stroke.  On presentation, systolic blood pressures were in the 200s. Taken for stat CT-no bleed. 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  No aneurysms.  LKW: 12 PM on 03/16/2019 tpa given?: no, outside the window Premorbid modified Rankin scale (mRS): 3  ROS:  Unable to obtain due to altered mental status.   Past Medical History:  Diagnosis Date  . Critical lower limb ischemia   . ETOH abuse   . Gout   . Hypertension     Family History  Problem Relation Age of Onset  . Diabetes Mellitus II Mother   . CAD Neg Hx   . Stroke Neg Hx     Social History:   reports that he has been smoking cigarettes. He has a 20.00 pack-year smoking history. He has never used smokeless tobacco. He reports current alcohol use of about 120.0 standard drinks of alcohol per week. He reports current drug use. Drugs: Cocaine and Marijuana.  Medications  Current Facility-Administered Medications:  .  LORazepam (ATIVAN) 2 MG/ML injection, , , ,  .  sodium chloride flush (NS) 0.9 % injection 3 mL, 3 mL, Intravenous, Once, Curatolo, Adam, DO  Current Outpatient Medications:  .  azithromycin (ZITHROMAX) 250 MG tablet, Take 1 tablet (250 mg total) by mouth daily., Disp: 3 tablet, Rfl: 0 .  folic acid (FOLVITE) 1 MG tablet, Take 1 tablet (1 mg total) by mouth daily. (Patient not taking: Reported on 08/29/2017), Disp: , Rfl:  .  ibuprofen (ADVIL,MOTRIN) 200 MG tablet, Take 400 mg by mouth every 6 (six) hours as needed for headache or mild pain., Disp: , Rfl:  .  Multiple Vitamin (MULTIVITAMIN WITH MINERALS) TABS tablet, Take 1 tablet by mouth daily. (Patient not taking: Reported on 01/23/2017), Disp: , Rfl:  .  potassium chloride SA (K-DUR,KLOR-CON) 20 MEQ tablet, Take 1 tablet (20 mEq total) by mouth 2 (two) times daily. (Patient not taking: Reported on 08/29/2017), Disp: 6 tablet, Rfl: 0 .  predniSONE (DELTASONE) 20 MG tablet, Take 2 tablets (40 mg total) by mouth daily., Disp: 6 tablet,  Rfl: 0 .  thiamine 100 MG tablet, Take 1 tablet (100 mg total) by mouth daily. (Patient not taking: Reported on 01/23/2017), Disp: , Rfl:  .  triamterene-hydrochlorothiazide (MAXZIDE-25) 37.5-25 MG per tablet, Take 1 tablet by mouth daily., Disp: 31 tablet, Rfl: 0  Exam: Current vital signs: Resp 16  Vital signs in last 24 hours: Resp:  [16] 16 (07/04 2353) FiO2 (%):  [100 %] 100 % (07/04 2353)   General: Awake, very combative HEENT: Normocephalic atraumatic CVS: Tachycardic Respiratory: Breathing normally saturating normally on room air Extremities: Left BKA,  right extremity with scarring Neurological exam He is awake, alert, keeps repeating "dammit" when verbally asked to follow commands or with noxious stimulation. Unable to answer any questions reliably.  He is severely dysarthric Cranial nerves: Pupils are equal round reactive light, he has a left gaze preference, can come to midline but cannot look to the right.  His face appears symmetric.  He does not blink to threat from either side. Motor exam: He is moving all 4 extremities and maybe is a tad bit weaker on the left compared to the right. Sensory exam: Withdraws all 4 to noxious stimulation and expresses verbal discontent on the noxious stimulation. Unable to assess coordination or gait at this time due to combativeness and inability to follow commands consistently NIHSS 1a Level of Conscious.: 0 1b LOC Questions: 2 1c LOC Commands: 2 2 Best Gaze: 1 3 Visual: 0 4 Facial Palsy: 0 5a Motor Arm - left: 1 5b Motor Arm - Right: 1 6a Motor Leg - Left: 1 6b Motor Leg - Right: 1 7 Limb Ataxia: 0 8 Sensory: 0 9 Best Language: 2 10 Dysarthria: 2 11 Extinct. and Inatten.:0  TOTAL: 13  Labs I have reviewed labs in epic and the results pertinent to this consultation are: Leukocytosis with white count of 13.3, severe hyponatremia-sodium 120  CBC    Component Value Date/Time   WBC 13.3 (H) 03/17/2019 2213   RBC 4.13 (L) 03/17/2019 2213   HGB 15.3 03/17/2019 2219   HCT 45.0 03/17/2019 2219   PLT 291 03/17/2019 2213   MCV 91.3 03/17/2019 2213   MCH 33.7 03/17/2019 2213   MCHC 36.9 (H) 03/17/2019 2213   RDW 12.4 03/17/2019 2213   LYMPHSABS 1.1 03/17/2019 2213   MONOABS 1.0 03/17/2019 2213   EOSABS 0.0 03/17/2019 2213   BASOSABS 0.0 03/17/2019 2213    CMP     Component Value Date/Time   NA 120 (L) 03/17/2019 2219   K 3.7 03/17/2019 2219   CL 83 (L) 03/17/2019 2219   CO2 22 03/17/2019 2213   GLUCOSE 105 (H) 03/17/2019 2219   BUN 10 03/17/2019 2219   CREATININE 0.60 (L)  03/17/2019 2219   CALCIUM 9.6 03/17/2019 2213   PROT 8.2 (H) 03/17/2019 2213   ALBUMIN 4.8 03/17/2019 2213   AST 32 03/17/2019 2213   ALT 26 03/17/2019 2213   ALKPHOS 57 03/17/2019 2213   BILITOT 1.8 (H) 03/17/2019 2213   GFRNONAA >60 03/17/2019 2213   GFRAA >60 03/17/2019 2213   Imaging I have reviewed the images obtained:  CT-scan of the brain-no acute changes.  No bleed. CTA head and neck- patent anterior and posterior circulations no LVO.  No aneurysm.  No significant stenosis. Prominent cluster of vessels 2 cm overlying right sylvian fissure and asymmetric increased enhancement of the right MCA distribution suspicious for a dural AV fistula.  Could also reflect increased perfusion of the right cerebral hemisphere from seizure  activity.  Assessment: 63 year old man past medical history of hypertension, COPD, and old subdural hematoma, alcohol abuse and cocaine abuse presenting for evaluation of altered mental status and left-sided weakness. His exam is a little confusing as he is very dysarthric, not following commands has a left gaze preference and may be some left-sided weakness that was noted in the ER- Per EMS, he was much weaker on the left side during their assessment. Labs revealed mild leukocytosis and severe hyponatremia.  Also no alcohol detected in his blood test today. He might of had a seizure secondary to alcohol withdrawal as well as hyponatremia.  Other etiologies could be hypertensive emergency.  Differentials-toxic metabolic encephalopathy versus seizure versus stroke versus hypertensive emergency.  I when I examined him, patient was protecting his airway but kept on having some coughing and gurgling and could not protect his airway and had to be emergently intubated afterwards.  He will be admitted to the ICU.  Received multiple doses of Ativan for the CT scan and also loaded with Keppra on my recommendation.  Impression:  Evaluate for seizure-alcohol withdrawal  versus hyponatremia Evaluate for toxic metabolic encephalopathy Evaluate for hypertensive emergency Evaluate for stroke  Recommendations: Agree with Keppra load 1 g.  Follow this with Keppra 500 twice daily. Maintain seizure precautions Management of hyponatremia per critical care medicine- slow correction advised with no more than 10 points in 24 hours for the risk of central pontine myelolysis. Check urinalysis, chest x-ray and urinary toxicology screen. Thiamine IV EEG in the morning. Stat MRI brain-ordered-next in line to get an MRI to rule out a brainstem stroke. Patient does not have an ICH.  Blood pressure does not need to be brought down stringently but goal blood pressure should be less than 180.  Ordered Cleviprex.   -- Milon DikesAshish Antwian Santaana, MD [facility] Pager: [phone] If 7pm to 7am, please call on call as listed on AMION.  CRITICAL CARE ATTESTATION Performed by: Milon DikesAshish Jermond Burkemper, MD Total critical care time: 55 minutes Critical care time was exclusive of separately billable procedures and treating other patients and/or supervising APPs/Residents/Students Critical care was necessary to treat or prevent imminent or life-threatening deterioration due to toxic metabolic encephalopathy versus seizure versus stroke This patient is critically ill and at significant risk for neurological worsening and/or death and care requires constant monitoring. Critical care was time spent personally by me on the following activities: development of treatment plan with patient and/or surrogate as well as nursing, discussions with consultants, evaluation of patient's response to treatment, examination of patient, obtaining history from patient or surrogate, ordering and performing treatments and interventions, ordering and review of laboratory studies, ordering and review of radiographic studies, pulse oximetry, re-evaluation of patient's condition, participation in multidisciplinary rounds  and medical decision making of high complexity in the care of this patient.   Addendum MRI brain completed-no stroke. Recommendations as above -pending drug screen and UA Neurology will follow  -- Milon DikesAshish Lexie Morini, MD [facility] Pager: [phone] If 7pm to 7am, please call on call as listed on AMION.

## 2019-03-17 NOTE — ED Triage Notes (Signed)
Pt here via [facility], nephew checked on pt last night at 2300 (LKW) and pt had aphasia, ams, and left side weakness. Family waited until tonight to call EMS because he had not improved.

## 2019-03-18 ENCOUNTER — Emergency Department (HOSPITAL_COMMUNITY): Payer: Medicaid Other

## 2019-03-18 ENCOUNTER — Inpatient Hospital Stay (HOSPITAL_COMMUNITY): Payer: Medicaid Other

## 2019-03-18 DIAGNOSIS — R569 Unspecified convulsions: Principal | ICD-10-CM

## 2019-03-18 DIAGNOSIS — Y92230 Patient room in hospital as the place of occurrence of the external cause: Secondary | ICD-10-CM | POA: Diagnosis not present

## 2019-03-18 DIAGNOSIS — J13 Pneumonia due to Streptococcus pneumoniae: Secondary | ICD-10-CM | POA: Diagnosis present

## 2019-03-18 DIAGNOSIS — E440 Moderate protein-calorie malnutrition: Secondary | ICD-10-CM | POA: Diagnosis present

## 2019-03-18 DIAGNOSIS — Z6825 Body mass index (BMI) 25.0-25.9, adult: Secondary | ICD-10-CM | POA: Diagnosis not present

## 2019-03-18 DIAGNOSIS — R471 Dysarthria and anarthria: Secondary | ICD-10-CM | POA: Diagnosis present

## 2019-03-18 DIAGNOSIS — R402342 Coma scale, best motor response, flexion withdrawal, at arrival to emergency department: Secondary | ICD-10-CM | POA: Diagnosis present

## 2019-03-18 DIAGNOSIS — I10 Essential (primary) hypertension: Secondary | ICD-10-CM | POA: Diagnosis present

## 2019-03-18 DIAGNOSIS — Z79899 Other long term (current) drug therapy: Secondary | ICD-10-CM | POA: Diagnosis not present

## 2019-03-18 DIAGNOSIS — R9431 Abnormal electrocardiogram [ECG] [EKG]: Secondary | ICD-10-CM | POA: Diagnosis not present

## 2019-03-18 DIAGNOSIS — I674 Hypertensive encephalopathy: Secondary | ICD-10-CM | POA: Diagnosis present

## 2019-03-18 DIAGNOSIS — J449 Chronic obstructive pulmonary disease, unspecified: Secondary | ICD-10-CM | POA: Diagnosis present

## 2019-03-18 DIAGNOSIS — R402222 Coma scale, best verbal response, incomprehensible words, at arrival to emergency department: Secondary | ICD-10-CM | POA: Diagnosis present

## 2019-03-18 DIAGNOSIS — J9602 Acute respiratory failure with hypercapnia: Secondary | ICD-10-CM | POA: Diagnosis present

## 2019-03-18 DIAGNOSIS — F17210 Nicotine dependence, cigarettes, uncomplicated: Secondary | ICD-10-CM | POA: Diagnosis present

## 2019-03-18 DIAGNOSIS — I4510 Unspecified right bundle-branch block: Secondary | ICD-10-CM | POA: Diagnosis present

## 2019-03-18 DIAGNOSIS — G9340 Encephalopathy, unspecified: Secondary | ICD-10-CM | POA: Diagnosis present

## 2019-03-18 DIAGNOSIS — Z791 Long term (current) use of non-steroidal anti-inflammatories (NSAID): Secondary | ICD-10-CM | POA: Diagnosis not present

## 2019-03-18 DIAGNOSIS — Z7952 Long term (current) use of systemic steroids: Secondary | ICD-10-CM | POA: Diagnosis not present

## 2019-03-18 DIAGNOSIS — F10239 Alcohol dependence with withdrawal, unspecified: Secondary | ICD-10-CM | POA: Diagnosis present

## 2019-03-18 DIAGNOSIS — J9601 Acute respiratory failure with hypoxia: Secondary | ICD-10-CM | POA: Diagnosis present

## 2019-03-18 DIAGNOSIS — G92 Toxic encephalopathy: Secondary | ICD-10-CM | POA: Diagnosis not present

## 2019-03-18 DIAGNOSIS — Z89512 Acquired absence of left leg below knee: Secondary | ICD-10-CM | POA: Diagnosis not present

## 2019-03-18 DIAGNOSIS — E871 Hypo-osmolality and hyponatremia: Secondary | ICD-10-CM | POA: Diagnosis present

## 2019-03-18 DIAGNOSIS — Z20828 Contact with and (suspected) exposure to other viral communicable diseases: Secondary | ICD-10-CM | POA: Diagnosis present

## 2019-03-18 DIAGNOSIS — E876 Hypokalemia: Secondary | ICD-10-CM | POA: Diagnosis present

## 2019-03-18 DIAGNOSIS — I161 Hypertensive emergency: Secondary | ICD-10-CM | POA: Diagnosis present

## 2019-03-18 DIAGNOSIS — I952 Hypotension due to drugs: Secondary | ICD-10-CM | POA: Diagnosis not present

## 2019-03-18 DIAGNOSIS — Z833 Family history of diabetes mellitus: Secondary | ICD-10-CM | POA: Diagnosis not present

## 2019-03-18 LAB — URINALYSIS, ROUTINE W REFLEX MICROSCOPIC
Bacteria, UA: NONE SEEN
Bilirubin Urine: NEGATIVE
Glucose, UA: NEGATIVE mg/dL
Ketones, ur: 20 mg/dL — AB
Leukocytes,Ua: NEGATIVE
Nitrite: NEGATIVE
Protein, ur: NEGATIVE mg/dL
Specific Gravity, Urine: >1.046 — ABNORMAL HIGH (ref 1.005–1.030)
pH: 6 (ref 5.0–8.0)

## 2019-03-18 LAB — OSMOLALITY: Osmolality: 258 mOsm/kg — ABNORMAL LOW (ref 275–295)

## 2019-03-18 LAB — POCT I-STAT 7, (LYTES, BLD GAS, ICA,H+H)
Acid-base deficit: 2 mmol/L (ref 0.0–2.0)
Bicarbonate: 24.5 mmol/L (ref 20.0–28.0)
Calcium, Ion: 1.15 mmol/L (ref 1.15–1.40)
HCT: 38 % — ABNORMAL LOW (ref 39.0–52.0)
Hemoglobin: 12.9 g/dL — ABNORMAL LOW (ref 13.0–17.0)
O2 Saturation: 100 %
Patient temperature: 102.7
Potassium: 3.8 mmol/L (ref 3.5–5.1)
Sodium: 120 mmol/L — ABNORMAL LOW (ref 135–145)
TCO2: 26 mmol/L (ref 22–32)
pCO2 arterial: 50.1 mmHg — ABNORMAL HIGH (ref 32.0–48.0)
pH, Arterial: 7.308 — ABNORMAL LOW (ref 7.350–7.450)
pO2, Arterial: 329 mmHg — ABNORMAL HIGH (ref 83.0–108.0)

## 2019-03-18 LAB — BASIC METABOLIC PANEL
Anion gap: 13 (ref 5–15)
Anion gap: 11 (ref 5–15)
Anion gap: 10 (ref 5–15)
BUN: 9 mg/dL (ref 8–23)
BUN: 9 mg/dL (ref 8–23)
BUN: 7 mg/dL — ABNORMAL LOW (ref 8–23)
CO2: 21 mmol/L — ABNORMAL LOW (ref 22–32)
CO2: 20 mmol/L — ABNORMAL LOW (ref 22–32)
CO2: 22 mmol/L (ref 22–32)
Calcium: 8.3 mg/dL — ABNORMAL LOW (ref 8.9–10.3)
Calcium: 7.9 mg/dL — ABNORMAL LOW (ref 8.9–10.3)
Calcium: 8.3 mg/dL — ABNORMAL LOW (ref 8.9–10.3)
Chloride: 89 mmol/L — ABNORMAL LOW (ref 98–111)
Chloride: 94 mmol/L — ABNORMAL LOW (ref 98–111)
Chloride: 98 mmol/L (ref 98–111)
Creatinine, Ser: 0.97 mg/dL (ref 0.61–1.24)
Creatinine, Ser: 0.79 mg/dL (ref 0.61–1.24)
Creatinine, Ser: 0.74 mg/dL (ref 0.61–1.24)
GFR calc Af Amer: >60 mL/min (ref >60)
GFR calc Af Amer: >60 mL/min (ref >60)
GFR calc Af Amer: >60 mL/min (ref >60)
GFR calc non Af Amer: >60 mL/min (ref >60)
GFR calc non Af Amer: >60 mL/min (ref >60)
GFR calc non Af Amer: >60 mL/min (ref >60)
Glucose, Bld: 82 mg/dL (ref 70–99)
Glucose, Bld: 98 mg/dL (ref 70–99)
Glucose, Bld: 113 mg/dL — ABNORMAL HIGH (ref 70–99)
Potassium: 3.9 mmol/L (ref 3.5–5.1)
Potassium: 3.6 mmol/L (ref 3.5–5.1)
Potassium: 2.9 mmol/L — ABNORMAL LOW (ref 3.5–5.1)
Sodium: 123 mmol/L — ABNORMAL LOW (ref 135–145)
Sodium: 125 mmol/L — ABNORMAL LOW (ref 135–145)
Sodium: 130 mmol/L — ABNORMAL LOW (ref 135–145)

## 2019-03-18 LAB — SARS CORONAVIRUS 2 BY RT PCR (HOSPITAL ORDER, PERFORMED IN ~~LOC~~ HOSPITAL LAB): SARS Coronavirus 2: NEGATIVE

## 2019-03-18 LAB — HIV ANTIBODY (ROUTINE TESTING W REFLEX): HIV Screen 4th Generation wRfx: NONREACTIVE

## 2019-03-18 LAB — GLUCOSE, CAPILLARY
Glucose-Capillary: 101 mg/dL — ABNORMAL HIGH (ref 70–99)
Glucose-Capillary: 110 mg/dL — ABNORMAL HIGH (ref 70–99)
Glucose-Capillary: 119 mg/dL — ABNORMAL HIGH (ref 70–99)
Glucose-Capillary: 81 mg/dL (ref 70–99)
Glucose-Capillary: 95 mg/dL (ref 70–99)

## 2019-03-18 LAB — TROPONIN I (HIGH SENSITIVITY)
Troponin I (High Sensitivity): 13 ng/L (ref <18)
Troponin I (High Sensitivity): 18 ng/L — ABNORMAL HIGH (ref <18)

## 2019-03-18 LAB — CBC
HCT: 32.5 % — ABNORMAL LOW (ref 39.0–52.0)
Hemoglobin: 11.7 g/dL — ABNORMAL LOW (ref 13.0–17.0)
MCH: 33.2 pg (ref 26.0–34.0)
MCHC: 36 g/dL (ref 30.0–36.0)
MCV: 92.3 fL (ref 80.0–100.0)
Platelets: 269 10*3/uL (ref 150–400)
RBC: 3.52 MIL/uL — ABNORMAL LOW (ref 4.22–5.81)
RDW: 12.6 % (ref 11.5–15.5)
WBC: 8 10*3/uL (ref 4.0–10.5)
nRBC: 0 % (ref 0.0–0.2)

## 2019-03-18 LAB — OSMOLALITY, URINE: Osmolality, Ur: 483 mOsm/kg (ref 300–900)

## 2019-03-18 LAB — SODIUM, URINE, RANDOM: Sodium, Ur: 65 mmol/L

## 2019-03-18 LAB — MAGNESIUM: Magnesium: 1.6 mg/dL — ABNORMAL LOW (ref 1.7–2.4)

## 2019-03-18 LAB — RAPID URINE DRUG SCREEN, HOSP PERFORMED
Amphetamines: NOT DETECTED
Barbiturates: NOT DETECTED
Benzodiazepines: POSITIVE — AB
Cocaine: POSITIVE — AB
Opiates: NOT DETECTED
Tetrahydrocannabinol: NOT DETECTED

## 2019-03-18 LAB — LACTIC ACID, PLASMA: Lactic Acid, Venous: 1.3 mmol/L (ref 0.5–1.9)

## 2019-03-18 LAB — PHOSPHORUS: Phosphorus: 3.4 mg/dL (ref 2.5–4.6)

## 2019-03-18 LAB — MRSA PCR SCREENING: MRSA by PCR: NEGATIVE

## 2019-03-18 MED ORDER — THIAMINE HCL 100 MG/ML IJ SOLN
100.0000 mg | Freq: Every day | INTRAMUSCULAR | Status: DC
  Administered 2019-03-19 – 2019-03-21 (×3): 100 mg via INTRAVENOUS
  Filled 2019-03-18 (×3): qty 2

## 2019-03-18 MED ORDER — MAGNESIUM SULFATE 2 GM/50ML IV SOLN
2.0000 g | Freq: Once | INTRAVENOUS | Status: AC
  Administered 2019-03-18: 2 g via INTRAVENOUS
  Filled 2019-03-18: qty 50

## 2019-03-18 MED ORDER — ACETAMINOPHEN 325 MG PO TABS
650.0000 mg | ORAL_TABLET | ORAL | Status: DC | PRN
  Filled 2019-03-18: qty 2

## 2019-03-18 MED ORDER — THIAMINE HCL 100 MG/ML IJ SOLN
Freq: Once | INTRAVENOUS | Status: AC
  Administered 2019-03-18: 10:00:00 via INTRAVENOUS
  Filled 2019-03-18: qty 1000

## 2019-03-18 MED ORDER — ENOXAPARIN SODIUM 40 MG/0.4ML ~~LOC~~ SOLN
40.0000 mg | Freq: Every day | SUBCUTANEOUS | Status: DC
  Administered 2019-03-18 – 2019-03-23 (×6): 40 mg via SUBCUTANEOUS
  Filled 2019-03-18 (×6): qty 0.4

## 2019-03-18 MED ORDER — LORAZEPAM 2 MG/ML IJ SOLN
2.0000 mg | INTRAMUSCULAR | Status: DC | PRN
  Administered 2019-03-19 – 2019-03-21 (×2): 2 mg via INTRAVENOUS
  Filled 2019-03-18 (×2): qty 1

## 2019-03-18 MED ORDER — BISACODYL 10 MG RE SUPP: 10.0000 mg | Freq: Every day | RECTAL | Status: DC | PRN

## 2019-03-18 MED ORDER — SODIUM CHLORIDE 0.9 % IV SOLN: INTRAVENOUS | Status: DC | PRN

## 2019-03-18 MED ORDER — FOLIC ACID 1 MG PO TABS
1.0000 mg | ORAL_TABLET | Freq: Every day | ORAL | Status: DC
  Administered 2019-03-18 – 2019-03-26 (×7): 1 mg
  Filled 2019-03-18 (×8): qty 1

## 2019-03-18 MED ORDER — LEVETIRACETAM 100 MG/ML PO SOLN: 500.0000 mg | Freq: Two times a day (BID) | ORAL | Status: DC

## 2019-03-18 MED ORDER — LEVETIRACETAM IN NACL 500 MG/100ML IV SOLN: 500.0000 mg | Freq: Two times a day (BID) | INTRAVENOUS | Status: DC

## 2019-03-18 MED ORDER — ORAL CARE MOUTH RINSE
15.0000 mL | OROMUCOSAL | Status: DC
  Administered 2019-03-18 – 2019-03-20 (×25): 15 mL via OROMUCOSAL

## 2019-03-18 MED ORDER — CHLORHEXIDINE GLUCONATE 0.12% ORAL RINSE (MEDLINE KIT)
15.0000 mL | Freq: Two times a day (BID) | OROMUCOSAL | Status: DC
  Administered 2019-03-18 – 2019-03-26 (×12): 15 mL via OROMUCOSAL

## 2019-03-18 MED ORDER — LORAZEPAM 2 MG/ML IJ SOLN
1.0000 mg | Freq: Four times a day (QID) | INTRAMUSCULAR | Status: DC
  Administered 2019-03-19: 1 mg via INTRAVENOUS
  Filled 2019-03-18: qty 1

## 2019-03-18 MED ORDER — SODIUM CHLORIDE 0.9 % IV SOLN
1350.0000 mg | Freq: Once | INTRAVENOUS | Status: AC
  Administered 2019-03-18: 1350 mg via INTRAVENOUS
  Filled 2019-03-18: qty 7

## 2019-03-18 MED ORDER — PANTOPRAZOLE SODIUM 40 MG PO PACK
40.0000 mg | PACK | Freq: Every day | ORAL | Status: DC
  Administered 2019-03-18 – 2019-03-19 (×2): 40 mg
  Filled 2019-03-18 (×2): qty 20

## 2019-03-18 MED ORDER — FENTANYL CITRATE (PF) 100 MCG/2ML IJ SOLN
50.0000 ug | INTRAMUSCULAR | Status: DC | PRN
  Administered 2019-03-18 – 2019-03-20 (×2): 50 ug via INTRAVENOUS
  Filled 2019-03-18 (×2): qty 2

## 2019-03-18 MED ORDER — ACETAMINOPHEN 650 MG RE SUPP
650.0000 mg | Freq: Once | RECTAL | Status: AC
  Administered 2019-03-18: 650 mg via RECTAL
  Filled 2019-03-18: qty 1

## 2019-03-18 MED ORDER — LORAZEPAM 2 MG/ML IJ SOLN
2.0000 mg | Freq: Four times a day (QID) | INTRAMUSCULAR | Status: AC
  Administered 2019-03-18 – 2019-03-19 (×4): 2 mg via INTRAVENOUS
  Filled 2019-03-18 (×4): qty 1

## 2019-03-18 MED ORDER — DOCUSATE SODIUM 50 MG/5ML PO LIQD: 100.0000 mg | Freq: Two times a day (BID) | ORAL | Status: DC | PRN

## 2019-03-18 MED ORDER — FENTANYL CITRATE (PF) 100 MCG/2ML IJ SOLN
INTRAMUSCULAR | Status: AC
  Filled 2019-03-18: qty 2

## 2019-03-18 MED ORDER — VITAL HIGH PROTEIN PO LIQD
1000.0000 mL | ORAL | Status: DC
  Administered 2019-03-18: 1000 mL

## 2019-03-18 MED ORDER — SODIUM CHLORIDE 0.9 % IV SOLN
2.0000 g | INTRAVENOUS | Status: DC
  Administered 2019-03-18 – 2019-03-19 (×3): 2 g via INTRAVENOUS
  Filled 2019-03-18: qty 2
  Filled 2019-03-18 (×2): qty 20
  Filled 2019-03-18: qty 2
  Filled 2019-03-18: qty 20

## 2019-03-18 MED ORDER — LEVETIRACETAM IN NACL 1000 MG/100ML IV SOLN
1000.0000 mg | Freq: Two times a day (BID) | INTRAVENOUS | Status: DC
  Administered 2019-03-18 – 2019-03-20 (×4): 1000 mg via INTRAVENOUS
  Filled 2019-03-18 (×4): qty 100

## 2019-03-18 MED ORDER — THIAMINE HCL 100 MG/ML IJ SOLN
100.0000 mg | Freq: Once | INTRAMUSCULAR | Status: AC
  Administered 2019-03-18: 02:00:00 100 mg via INTRAVENOUS
  Filled 2019-03-18: qty 2

## 2019-03-18 MED ORDER — LEVETIRACETAM IN NACL 1000 MG/100ML IV SOLN
1000.0000 mg | INTRAVENOUS | Status: AC
  Administered 2019-03-18: 1000 mg via INTRAVENOUS
  Filled 2019-03-18: qty 100

## 2019-03-18 MED ORDER — PROPOFOL 1000 MG/100ML IV EMUL
5.0000 ug/kg/min | INTRAVENOUS | Status: DC
  Administered 2019-03-18: 10 ug/kg/min via INTRAVENOUS
  Filled 2019-03-18: qty 100

## 2019-03-18 MED ORDER — PHENYLEPHRINE HCL-NACL 10-0.9 MG/250ML-% IV SOLN
0.0000 ug/min | INTRAVENOUS | Status: DC
  Administered 2019-03-18: 90 ug/min via INTRAVENOUS
  Administered 2019-03-18: 20 ug/min via INTRAVENOUS
  Administered 2019-03-18: 80 ug/min via INTRAVENOUS
  Administered 2019-03-18: 30 ug/min via INTRAVENOUS
  Administered 2019-03-18: 90 ug/min via INTRAVENOUS
  Administered 2019-03-18: 80 ug/min via INTRAVENOUS
  Administered 2019-03-19: 50 ug/min via INTRAVENOUS
  Administered 2019-03-19: 10 ug/min via INTRAVENOUS
  Filled 2019-03-18 (×10): qty 250

## 2019-03-18 MED ORDER — LORAZEPAM 1 MG PO TABS: 2.0000 mg | ORAL_TABLET | Freq: Four times a day (QID) | ORAL | Status: DC

## 2019-03-18 MED ORDER — FENTANYL CITRATE (PF) 100 MCG/2ML IJ SOLN
50.0000 ug | INTRAMUSCULAR | Status: DC | PRN
  Administered 2019-03-18 – 2019-03-20 (×7): 100 ug via INTRAVENOUS
  Filled 2019-03-18 (×9): qty 2

## 2019-03-18 MED ORDER — SODIUM CHLORIDE 0.9 % IV BOLUS
500.0000 mL | Freq: Once | INTRAVENOUS | Status: AC
  Administered 2019-03-18: 500 mL via INTRAVENOUS

## 2019-03-18 MED ORDER — CHLORHEXIDINE GLUCONATE CLOTH 2 % EX PADS
6.0000 | MEDICATED_PAD | Freq: Every day | CUTANEOUS | Status: DC
  Administered 2019-03-18 – 2019-03-23 (×5): 6 via TOPICAL

## 2019-03-18 MED ORDER — PHENYTOIN SODIUM 50 MG/ML IJ SOLN
100.0000 mg | Freq: Three times a day (TID) | INTRAMUSCULAR | Status: DC
  Administered 2019-03-18 – 2019-03-21 (×9): 100 mg via INTRAVENOUS
  Filled 2019-03-18 (×9): qty 2

## 2019-03-18 NOTE — Progress Notes (Addendum)
Subjective: Intermittent facial twitching this AM at about 6:30. Fosphenytoin is being loaded in addition to a supplemental Keppra load of 1000 mg. STAT EEG was ordered.   Objective: Current vital signs: BP 134/67   Pulse (!) 109   Temp 100.3 F (37.9 C) (Axillary)   Resp 16   Ht 5' 4"  (1.626 m)   Wt 67.7 kg   SpO2 100%   BMI 25.62 kg/m  Vital signs in last 24 hours: Temp:  [100.3 F (37.9 C)-102 F (38.9 C)] 100.3 F (37.9 C) (07/05 0400) Pulse Rate:  [87-149] 109 (07/05 0735) Resp:  [6-38] 16 (07/05 0735) BP: (80-219)/(62-207) 134/67 (07/05 0735) SpO2:  [92 %-100 %] 100 % (07/05 0735) Arterial Line BP: (133-167)/(65-76) 146/67 (07/05 0730) FiO2 (%):  [40 %-100 %] 40 % (07/05 0735) Weight:  [67.7 kg] 67.7 kg (07/05 0248)  Intake/Output from previous day: 07/04 0701 - 07/05 0700 In: 228.7 [I.V.:16.3; IV Piggyback:212.4] Out: 250 [Urine:250] Intake/Output this shift: No intake/output data recorded. Nutritional status:  Diet Order            Diet NPO time specified  Diet effective now              Neurologic Exam: Sedated on 10 mcg/kg/min of propofol Ment: Sedated with no responses to any external stimuli.  CN: Pupils sluggishly reactive and slightly asymmetric R 70m left 2.5 mm. No blink to threat. No doll's eye reflex. Face flaccidly symmetric. Intubated.  Motor: Flaccid x 4. Left BKA noted. Decreased muscle bulk to distal RLE.  Sensory: No response to arm or thigh pinch bilaterally Other: No jerking, twitching, forced eye deviation, nystagmus, posturing or other findings suggestive of ongoing seizure are seen.   Lab Results: Results for orders placed or performed during the hospital encounter of 03/17/19 (from the past 48 hour(s))  Protime-INR     Status: None   Collection Time: 03/17/19 10:13 PM  Result Value Ref Range   Prothrombin Time 13.6 11.4 - 15.2 seconds   INR 1.1 0.8 - 1.2    Comment: (NOTE) INR goal varies based on device and disease  states. Performed at [facility] [address], [place] [address]  APTT     Status: None   Collection Time: 03/17/19 10:13 PM  Result Value Ref Range   aPTT 31 24 - 36 seconds    Comment: Performed at [facility] [place] CBC     Status: Abnormal   Collection Time: 03/17/19 10:13 PM  Result Value Ref Range   WBC 13.3 (H) 4.0 - 10.5 K/uL   RBC 4.13 (L) 4.22 - 5.81 MIL/uL   Hemoglobin 13.9 13.0 - 17.0 g/dL   HCT 37.7 (L) 39.0 - 52.0 %   MCV 91.3 80.0 - 100.0 fL   MCH 33.7 26.0 - 34.0 pg   MCHC 36.9 (H) 30.0 - 36.0 g/dL   RDW 12.4 11.5 - 15.5 %   Platelets 291 150 - 400 K/uL   nRBC 0.0 0.0 - 0.2 %    Comment: Performed at [facility] [address] [place] [address] Differential     Status: Abnormal   Collection Time: 03/17/19 10:13 PM  Result Value Ref Range   Neutrophils Relative % 83 %   Neutro Abs 11.1 (H) 1.7 - 7.7 K/uL   Lymphocytes Relative 8 %   Lymphs Abs 1.1 0.7 - 4.0 K/uL   Monocytes  Relative 8 %   Monocytes Absolute 1.0 0.1 - 1.0 K/uL   Eosinophils Relative 0 %   Eosinophils Absolute 0.0 0.0 - 0.5 K/uL   Basophils Relative 0 %   Basophils Absolute 0.0 0.0 - 0.1 K/uL   Immature Granulocytes 1 %   Abs Immature Granulocytes 0.08 (H) 0.00 - 0.07 K/uL    Comment: Performed at [facility] [address], [place], [place] [address]  Comprehensive metabolic panel     Status: Abnormal   Collection Time: 03/17/19 10:13 PM  Result Value Ref Range   Sodium 120 (L) 135 - 145 mmol/L   Potassium 3.7 3.5 - 5.1 mmol/L   Chloride 82 (L) 98 - 111 mmol/L   CO2 22 22 - 32 mmol/L   Glucose, Bld 105 (H) 70 - 99 mg/dL   BUN 10 8 - 23 mg/dL   Creatinine, Ser 0.88 0.61 - 1.24 mg/dL   Calcium 9.6 8.9 - 10.3 mg/dL   Total Protein 8.2 (H) 6.5 - 8.1 g/dL   Albumin 4.8 3.5 - 5.0 g/dL   AST 32 15 - 41 U/L   ALT 26 0 - 44 U/L   Alkaline Phosphatase 57 38 - 126 U/L   Total Bilirubin 1.8 (H) 0.3 -  1.2 mg/dL   GFR calc non Af Amer >60 >60 mL/min   GFR calc Af Amer >60 >60 mL/min   Anion gap 16 (H) 5 - 15    Comment: Performed at [facility] [address] [place], [place] [address]  CK     Status: None   Collection Time: 03/17/19 10:17 PM  Result Value Ref Range   Total CK 341 49 - 397 U/L    Comment: Performed at [facility], [place] [address], [place], [place] [address]  Magnesium     Status: None   Collection Time: 03/17/19 10:17 PM  Result Value Ref Range   Magnesium 1.8 1.7 - 2.4 mg/dL    Comment: Performed at [facility] [address] [place], [place] [address]  I-stat chem 8, ED     Status: Abnormal   Collection Time: 03/17/19 10:19 PM  Result Value Ref Range   Sodium 120 (L) 135 - 145 mmol/L   Potassium 3.7 3.5 - 5.1 mmol/L   Chloride 83 (L) 98 - 111 mmol/L   BUN 10 8 - 23 mg/dL   Creatinine, Ser 0.60 (L) 0.61 - 1.24 mg/dL   Glucose, Bld 105 (H) 70 - 99 mg/dL   Calcium, Ion 1.10 (L) 1.15 - 1.40 mmol/L   TCO2 23 22 - 32 mmol/L   Hemoglobin 15.3 13.0 - 17.0 g/dL   HCT 45.0 39.0 - 52.0 %  Ethanol     Status: None   Collection Time: 03/17/19 10:35 PM  Result Value Ref Range   Alcohol, Ethyl (B) <10 <10 mg/dL    Comment: (NOTE) Lowest detectable limit for serum alcohol is 10 mg/dL. For medical purposes only. Performed at [facility], [place] [address] [place], [place] [address]   CBG monitoring, ED     Status: Abnormal   Collection Time: 03/17/19 10:50 PM  Result Value Ref Range   Glucose-Capillary 104 (H) 70 - 99 mg/dL  SARS Coronavirus 2 (CEPHEID - Performed in [facility] hospital lab), Hosp Order     Status: None   Collection Time: 03/18/19 12:09 AM   Specimen: Nasopharyngeal Swab  Result Value Ref Range   SARS Coronavirus  2 NEGATIVE NEGATIVE    Comment: (NOTE) If result is NEGATIVE SARS-CoV-2 target nucleic acids are NOT DETECTED. The SARS-CoV-2 RNA is generally detectable in upper and lower  respiratory specimens during the  acute phase of infection. The lowest  concentration of SARS-CoV-2 viral copies this assay can detect is 250  copies / mL. A negative result does not preclude SARS-CoV-2 infection  and should not be used as the sole basis for treatment or other  patient management decisions.  A negative result may occur with  improper specimen collection / handling, submission of specimen other  than nasopharyngeal swab, presence of viral mutation(s) within the  areas targeted by this assay, and inadequate number of viral copies  (<250 copies / mL). A negative result must be combined with clinical  observations, patient history, and epidemiological information. If result is POSITIVE SARS-CoV-2 target nucleic acids are DETECTED. The SARS-CoV-2 RNA is generally detectable in upper and lower  respiratory specimens dur ing the acute phase of infection.  Positive  results are indicative of active infection with SARS-CoV-2.  Clinical  correlation with patient history and other diagnostic information is  necessary to determine patient infection status.  Positive results do  not rule out bacterial infection or co-infection with other viruses. If result is PRESUMPTIVE POSTIVE SARS-CoV-2 nucleic acids MAY BE PRESENT.   A presumptive positive result was obtained on the submitted specimen  and confirmed on repeat testing.  While 2019 novel coronavirus  (SARS-CoV-2) nucleic acids may be present in the submitted sample  additional confirmatory testing may be necessary for epidemiological  and / or clinical management purposes  to differentiate between  SARS-CoV-2 and other Sarbecovirus currently known to infect humans.  If clinically indicated additional testing with an alternate test  methodology [phone]) is advised. The SARS-CoV-2 RNA is generally  detectable in upper and lower respiratory sp ecimens during the acute  phase of infection. The expected result is Negative. Fact Sheet for Patients:   StrictlyIdeas.no Fact Sheet for Healthcare Providers: BankingDealers.co.za This test is not yet approved or cleared by the [place] FDA and has been authorized for detection and/or diagnosis of SARS-CoV-2 by FDA under an Emergency Use Authorization (EUA).  This EUA will remain in effect (meaning this test can be used) for the duration of the COVID-19 declaration under Section 564(b)(1) of the Act, 21 U.S.C. section 360bbb-3(b)(1), unless the authorization is terminated or revoked sooner. Performed at [facility], [place] [address], [place], [place] [address]   Lactic acid, plasma     Status: None   Collection Time: 03/18/19 12:13 AM  Result Value Ref Range   Lactic Acid, Venous 1.3 0.5 - 1.9 mmol/L    Comment: Performed at [facility] [address] [place], [place] [address]  Troponin I (High Sensitivity)     Status: None   Collection Time: 03/18/19 12:13 AM  Result Value Ref Range   Troponin I (High Sensitivity) 13 <18 ng/L    Comment: (NOTE) Elevated high sensitivity troponin I (hsTnI) values and significant  changes across serial measurements may suggest ACS but many other  chronic and acute conditions are known to elevate hsTnI results.  Refer to the "Links" section for chest pain algorithms and additional  guidance. Performed at [facility], [place] [address], [place], [place] [address]   I-STAT 7, (LYTES, BLD GAS, ICA, H+H)     Status: Abnormal   Collection Time: 03/18/19 12:38 AM  Result Value Ref Range  pH, Arterial 7.308 (L) 7.350 - 7.450   pCO2 arterial 50.1 (H) 32.0 - 48.0 mmHg   pO2, Arterial 329.0 (H) 83.0 - 108.0 mmHg   Bicarbonate 24.5 20.0 - 28.0 mmol/L   TCO2 26 22 - 32 mmol/L   O2 Saturation 100.0 %   Acid-base deficit 2.0 0.0 - 2.0 mmol/L   Sodium 120 (L) 135 - 145 mmol/L   Potassium 3.8 3.5 - 5.1 mmol/L   Calcium, Ion 1.15 1.15 - 1.40 mmol/L   HCT 38.0 (L) 39.0 - 52.0 %   Hemoglobin  12.9 (L) 13.0 - 17.0 g/dL   Patient temperature 102.7 F    Collection site RADIAL, ALLEN'S TEST ACCEPTABLE    Drawn by RT    Sample type ARTERIAL   MRSA PCR Screening     Status: None   Collection Time: 03/18/19  2:54 AM   Specimen: Nasal Mucosa; Nasopharyngeal  Result Value Ref Range   MRSA by PCR NEGATIVE NEGATIVE    Comment:        The GeneXpert MRSA Assay (FDA approved for NASAL specimens only), is one component of a comprehensive MRSA colonization surveillance program. It is not intended to diagnose MRSA infection nor to guide or monitor treatment for MRSA infections. Performed at [facility], [place] [address], [place], [place] [address]   Glucose, capillary     Status: None   Collection Time: 03/18/19  3:24 AM  Result Value Ref Range   Glucose-Capillary 81 70 - 99 mg/dL  Troponin I (High Sensitivity)     Status: Abnormal   Collection Time: 03/18/19  4:21 AM  Result Value Ref Range   Troponin I (High Sensitivity) 18 (H) <18 ng/L    Comment: (NOTE) Elevated high sensitivity troponin I (hsTnI) values and significant  changes across serial measurements may suggest ACS but many other  chronic and acute conditions are known to elevate hsTnI results.  Refer to the "Links" section for chest pain algorithms and additional  guidance. Performed at [facility], [place] [address], [place], [place] [address]   Basic metabolic panel     Status: Abnormal   Collection Time: 03/18/19  4:21 AM  Result Value Ref Range   Sodium 123 (L) 135 - 145 mmol/L   Potassium 3.9 3.5 - 5.1 mmol/L   Chloride 89 (L) 98 - 111 mmol/L   CO2 21 (L) 22 - 32 mmol/L   Glucose, Bld 82 70 - 99 mg/dL   BUN 9 8 - 23 mg/dL   Creatinine, Ser 0.97 0.61 - 1.24 mg/dL   Calcium 8.3 (L) 8.9 - 10.3 mg/dL   GFR calc non Af Amer >60 >60 mL/min   GFR calc Af Amer >60 >60 mL/min   Anion gap 13 5 - 15    Comment: Performed at [facility], [place] [address] [place], [place] [address]  Magnesium      Status: Abnormal   Collection Time: 03/18/19  4:21 AM  Result Value Ref Range   Magnesium 1.6 (L) 1.7 - 2.4 mg/dL    Comment: Performed at [facility] [address] [place], [place] [address]  Phosphorus     Status: None   Collection Time: 03/18/19  4:21 AM  Result Value Ref Range   Phosphorus 3.4 2.5 - 4.6 mg/dL    Comment: Performed at [facility] [address] [place], [place] [address]  CBC     Status: Abnormal   Collection Time:  03/18/19  6:31 AM  Result Value Ref Range   WBC 8.0 4.0 - 10.5 K/uL   RBC 3.52 (L) 4.22 - 5.81 MIL/uL   Hemoglobin 11.7 (L) 13.0 - 17.0 g/dL   HCT 32.5 (L) 39.0 - 52.0 %   MCV 92.3 80.0 - 100.0 fL   MCH 33.2 26.0 - 34.0 pg   MCHC 36.0 30.0 - 36.0 g/dL   RDW 12.6 11.5 - 15.5 %   Platelets 269 150 - 400 K/uL   nRBC 0.0 0.0 - 0.2 %    Comment: Performed at [facility], [place] [address] [place], [place] [address]  Urinalysis, Routine w reflex microscopic     Status: Abnormal   Collection Time: 03/18/19  7:00 AM  Result Value Ref Range   Color, Urine YELLOW YELLOW   APPearance CLEAR CLEAR   Specific Gravity, Urine >1.046 (H) 1.005 - 1.030   pH 6.0 5.0 - 8.0   Glucose, UA NEGATIVE NEGATIVE mg/dL   Hgb urine dipstick SMALL (A) NEGATIVE   Bilirubin Urine NEGATIVE NEGATIVE   Ketones, ur 20 (A) NEGATIVE mg/dL   Protein, ur NEGATIVE NEGATIVE mg/dL   Nitrite NEGATIVE NEGATIVE   Leukocytes,Ua NEGATIVE NEGATIVE   RBC / HPF 0-5 0 - 5 RBC/hpf   WBC, UA 0-5 0 - 5 WBC/hpf   Bacteria, UA NONE SEEN NONE SEEN   Squamous Epithelial / LPF 0-5 0 - 5    Comment: Performed at [facility], [place] [address], [place], [place] [address]    Recent Results (from the past 240 hour(s))  SARS Coronavirus 2 (CEPHEID - Performed in [facility] hospital lab), Hosp Order     Status: None   Collection Time: 03/18/19 12:09 AM   Specimen: Nasopharyngeal Swab  Result Value Ref Range Status   SARS Coronavirus 2 NEGATIVE NEGATIVE Final    Comment:  (NOTE) If result is NEGATIVE SARS-CoV-2 target nucleic acids are NOT DETECTED. The SARS-CoV-2 RNA is generally detectable in upper and lower  respiratory specimens during the acute phase of infection. The lowest  concentration of SARS-CoV-2 viral copies this assay can detect is 250  copies / mL. A negative result does not preclude SARS-CoV-2 infection  and should not be used as the sole basis for treatment or other  patient management decisions.  A negative result may occur with  improper specimen collection / handling, submission of specimen other  than nasopharyngeal swab, presence of viral mutation(s) within the  areas targeted by this assay, and inadequate number of viral copies  (<250 copies / mL). A negative result must be combined with clinical  observations, patient history, and epidemiological information. If result is POSITIVE SARS-CoV-2 target nucleic acids are DETECTED. The SARS-CoV-2 RNA is generally detectable in upper and lower  respiratory specimens dur ing the acute phase of infection.  Positive  results are indicative of active infection with SARS-CoV-2.  Clinical  correlation with patient history and other diagnostic information is  necessary to determine patient infection status.  Positive results do  not rule out bacterial infection or co-infection with other viruses. If result is PRESUMPTIVE POSTIVE SARS-CoV-2 nucleic acids MAY BE PRESENT.   A presumptive positive result was obtained on the submitted specimen  and confirmed on repeat testing.  While 2019 novel coronavirus  (SARS-CoV-2) nucleic acids may be present in the submitted sample  additional confirmatory testing may be necessary for epidemiological  and / or clinical management purposes  to differentiate between  SARS-CoV-2 and  other Sarbecovirus currently known to infect humans.  If clinically indicated additional testing with an alternate test  methodology [phone]) is advised. The SARS-CoV-2 RNA is  generally  detectable in upper and lower respiratory sp ecimens during the acute  phase of infection. The expected result is Negative. Fact Sheet for Patients:  StrictlyIdeas.no Fact Sheet for Healthcare Providers: BankingDealers.co.za This test is not yet approved or cleared by the [place] FDA and has been authorized for detection and/or diagnosis of SARS-CoV-2 by FDA under an Emergency Use Authorization (EUA).  This EUA will remain in effect (meaning this test can be used) for the duration of the COVID-19 declaration under Section 564(b)(1) of the Act, 21 U.S.C. section 360bbb-3(b)(1), unless the authorization is terminated or revoked sooner. Performed at [facility], [place] [address] [place], [place] [address]   MRSA PCR Screening     Status: None   Collection Time: 03/18/19  2:54 AM   Specimen: Nasal Mucosa; Nasopharyngeal  Result Value Ref Range Status   MRSA by PCR NEGATIVE NEGATIVE Final    Comment:        The GeneXpert MRSA Assay (FDA approved for NASAL specimens only), is one component of a comprehensive MRSA colonization surveillance program. It is not intended to diagnose MRSA infection nor to guide or monitor treatment for MRSA infections. Performed at [facility], [place] [address] [place], [place] [address]     Lipid Panel No results for input(s): CHOL, TRIG, HDL, CHOLHDL, VLDL, LDLCALC in the last 72 hours.  Studies/Results: Ct Angio Head W Or Wo Contrast  Result Date: 03/17/2019 CLINICAL DATA:  63 y/o  M; stroke follow-up. Left-sided weakness. EXAM: CT ANGIOGRAPHY HEAD AND NECK CT PERFUSION BRAIN TECHNIQUE: Multidetector CT imaging of the head and neck was performed using the standard protocol during bolus administration of intravenous contrast. Multiplanar CT image reconstructions and MIPs were obtained to evaluate the vascular anatomy. Carotid stenosis measurements (when applicable) are obtained  utilizing NASCET criteria, using the distal internal carotid diameter as the denominator. Multiphase CT imaging of the brain was performed following IV bolus contrast injection. Subsequent parametric perfusion maps were calculated using RAPID software. CONTRAST:  115 cc Omnipaque 350 COMPARISON:  03/17/2019 CT head. FINDINGS: CTA NECK FINDINGS Aortic arch: Standard branching. Imaged portion shows no evidence of aneurysm or dissection. No significant stenosis of the major arch vessel origins. Mild calcific atherosclerosis. Right carotid system: No evidence of dissection, stenosis (50% or greater) or occlusion. Mild non stenotic calcific atherosclerosis of carotid bifurcation. Left carotid system: No evidence of dissection, stenosis (50% or greater) or occlusion. Mild non stenotic calcific atherosclerosis of carotid bifurcation. Vertebral arteries: Left dominant. No evidence of dissection, stenosis (50% or greater) or occlusion. Skeleton: No acute finding. Other neck: Negative. Upper chest: Centrilobular emphysema. Review of the MIP images confirms the above findings CTA HEAD FINDINGS Anterior circulation: No significant stenosis, proximal occlusion, or aneurysm. Mild non stenotic calcific atherosclerosis of internal carotid arteries. There is a prominent cluster of vessels overlying the right sylvian fissure spanning 20 x 7 x 7 mm (AP by ML series 10, image 16 and series 9, image 41) as well as mildly increased enhancement of the right MCA distribution in comparison with the left. Posterior circulation: No significant stenosis, proximal occlusion, aneurysm, or vascular malformation. Left dominant vertebrobasilar system. Mild non stenotic calcific atherosclerosis of basilar artery and vertebral artery. Venous sinuses: As permitted by contrast timing, patent. Anatomic variants: None significant. Review of the MIP images  confirms the above findings CT Brain Perfusion Findings: ASPECTS: 10 CBF (<30%) Volume: 630m  Perfusion (Tmax>6.0s) volume: 03mMismatch Volume: 30m22mnfarction Location:Not applicable. Motion artifact. IMPRESSION: CT brain perfusion: 1. Negative CT brain perfusion.  Mild motion artifact. CTA neck: 1. Patent carotid and vertebral arteries. No dissection, aneurysm, or hemodynamically significant stenosis utilizing NASCET criteria. 2. Centrilobular emphysema of lung apices. 3. Mild calcific atherosclerosis of aortic arch, carotid bifurcations, and vertebral arteries. CTA head: 1. Patent anterior and posterior intracranial circulation. No large vessel occlusion, aneurysm, or significant stenosis. 2. 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 These results were called by telephone at the time of interpretation on 03/17/2019 at 11:11 pm to Dr. ASHAmie Portlandwho verbally acknowledged these results. Electronically Signed   By: LanKristine GarbeD.   On: 03/17/2019 23:19   Ct Angio Neck W Or Wo Contrast  Result Date: 03/17/2019 CLINICAL DATA:  [age]  M; stroke follow-up. Left-sided weakness. EXAM: CT ANGIOGRAPHY HEAD AND NECK CT PERFUSION BRAIN TECHNIQUE: Multidetector CT imaging of the head and neck was performed using the standard protocol during bolus administration of intravenous contrast. Multiplanar CT image reconstructions and MIPs were obtained to evaluate the vascular anatomy. Carotid stenosis measurements (when applicable) are obtained utilizing NASCET criteria, using the distal internal carotid diameter as the denominator. Multiphase CT imaging of the brain was performed following IV bolus contrast injection. Subsequent parametric perfusion maps were calculated using RAPID software. CONTRAST:  115 cc Omnipaque 350 COMPARISON:  03/17/2019 CT head. FINDINGS: CTA NECK FINDINGS Aortic arch: Standard branching. Imaged portion shows no evidence of aneurysm or dissection. No  significant stenosis of the major arch vessel origins. Mild calcific atherosclerosis. Right carotid system: No evidence of dissection, stenosis (50% or greater) or occlusion. Mild non stenotic calcific atherosclerosis of carotid bifurcation. Left carotid system: No evidence of dissection, stenosis (50% or greater) or occlusion. Mild non stenotic calcific atherosclerosis of carotid bifurcation. Vertebral arteries: Left dominant. No evidence of dissection, stenosis (50% or greater) or occlusion. Skeleton: No acute finding. Other neck: Negative. Upper chest: Centrilobular emphysema. Review of the MIP images confirms the above findings CTA HEAD FINDINGS Anterior circulation: No significant stenosis, proximal occlusion, or aneurysm. Mild non stenotic calcific atherosclerosis of internal carotid arteries. There is a prominent cluster of vessels overlying the right sylvian fissure spanning 20 x 7 x 7 mm (AP by ML series 10, image 16 and series 9, image 41) as well as mildly increased enhancement of the right MCA distribution in comparison with the left. Posterior circulation: No significant stenosis, proximal occlusion, aneurysm, or vascular malformation. Left dominant vertebrobasilar system. Mild non stenotic calcific atherosclerosis of basilar artery and vertebral artery. Venous sinuses: As permitted by contrast timing, patent. Anatomic variants: None significant. Review of the MIP images confirms the above findings CT Brain Perfusion Findings: ASPECTS: 10 CBF (<30%) Volume: 30mL27mrfusion (Tmax>6.0s) volume: 30mL 630mmatch Volume: 30mL I61mrction Location:Not applicable. Motion artifact. IMPRESSION: CT brain perfusion: 1. Negative CT brain perfusion.  Mild motion artifact. CTA neck: 1. Patent carotid and vertebral arteries. No dissection, aneurysm, or hemodynamically significant stenosis utilizing NASCET criteria. 2. Centrilobular emphysema of lung apices. 3. Mild calcific atherosclerosis of aortic arch, carotid  bifurcations, and vertebral arteries. CTA head: 1. Patent anterior and posterior intracranial circulation. No large vessel occlusion, aneurysm, or significant stenosis. 2. Prominent cluster of vessels spanning 2 cm overlying right Sylvian fissure and  asymmetric increased enhancement of right MCA distribution suspicious for dural AV fistula. These results were called by telephone at the time of interpretation on 03/17/2019 at 11:11 pm to Dr. Amie Portland , who verbally acknowledged these results. Electronically Signed   By: Kristine Garbe M.D.   On: 03/17/2019 23:19   Mr Brain Wo Contrast  Result Date: 03/18/2019 CLINICAL DATA:  63 y/o  M; in cephalopathy. EXAM: MRI HEAD WITHOUT CONTRAST TECHNIQUE: Multiplanar, multiecho pulse sequences of the brain and surrounding structures were obtained without intravenous contrast. COMPARISON:  03/17/2019 and 06/19/2014 CT head. FINDINGS: Brain: No acute infarction, hemorrhage, hydrocephalus, extra-axial collection or mass lesion. 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 Progression of early confluent nonspecific T2 FLAIR hyperintensities in subcortical and periventricular white matter compatible with moderate chronic microvascular ischemic changes. Progression of moderate volume loss of the brain. There are a few punctate foci of susceptibility hypointensity within the basal ganglia and brainstem compatible with hemosiderin deposition of chronic microhemorrhage, probably secondary to chronic hypertension given distribution. Vascular: Normal flow voids. Skull and upper cervical spine: Normal marrow signal. Sinuses/Orbits: Mild mucosal thickening of the ethmoid air cells and maxillary sinuses. Debris within the nasopharynx. Partial opacification of mastoid air cells. Orbits are unremarkable. Other: None. IMPRESSION: 1. No acute intracranial  abnormality identified. 2. Progression of moderate chronic microvascular ischemic changes and volume loss of the brain from 2015. Chronic lacunar infarcts in the basal ganglia bilaterally. Electronically Signed   By: Kristine Garbe M.D.   On: 03/18/2019 02:52   Ct Cerebral Perfusion W Contrast  Result Date: 03/17/2019 CLINICAL DATA:  63 y/o  M; stroke follow-up. Left-sided weakness. EXAM: CT ANGIOGRAPHY HEAD AND NECK CT PERFUSION BRAIN TECHNIQUE: Multidetector CT imaging of the head and neck was performed using the standard protocol during bolus administration of intravenous contrast. Multiplanar CT image reconstructions and MIPs were obtained to evaluate the vascular anatomy. Carotid stenosis measurements (when applicable) are obtained utilizing NASCET criteria, using the distal internal carotid diameter as the denominator. Multiphase CT imaging of the brain was performed following IV bolus contrast injection. Subsequent parametric perfusion maps were calculated using RAPID software. CONTRAST:  115 cc Omnipaque 350 COMPARISON:  03/17/2019 CT head. FINDINGS: CTA NECK FINDINGS Aortic arch: Standard branching. Imaged portion shows no evidence of aneurysm or dissection. No significant stenosis of the major arch vessel origins. Mild calcific atherosclerosis. Right carotid system: No evidence of dissection, stenosis (50% or greater) or occlusion. Mild non stenotic calcific atherosclerosis of carotid bifurcation. Left carotid system: No evidence of dissection, stenosis (50% or greater) or occlusion. Mild non stenotic calcific atherosclerosis of carotid bifurcation. Vertebral arteries: Left dominant. No evidence of dissection, stenosis (50% or greater) or occlusion. Skeleton: No acute finding. Other neck: Negative. Upper chest: Centrilobular emphysema. Review of the MIP images confirms the above findings CTA HEAD FINDINGS Anterior circulation: No significant stenosis, proximal occlusion, or aneurysm. Mild non  stenotic calcific atherosclerosis of internal carotid arteries. There is a prominent cluster of vessels overlying the right sylvian fissure spanning 20 x 7 x 7 mm (AP by ML series 10, image 16 and series 9, image 41) as well as mildly increased enhancement of the right MCA distribution in comparison with the left. Posterior circulation: No significant stenosis, proximal occlusion, aneurysm, or vascular malformation. Left dominant vertebrobasilar system. Mild non stenotic calcific atherosclerosis of basilar artery and vertebral artery. Venous sinuses: As  permitted by contrast timing, patent. Anatomic variants: None significant. Review of the MIP images confirms the above findings CT Brain Perfusion Findings: ASPECTS: 10 CBF (<30%) Volume: 79m Perfusion (Tmax>6.0s) volume: 066mMismatch Volume: 39m52mnfarction Location:Not applicable. Motion artifact. IMPRESSION: CT brain perfusion: 1. Negative CT brain perfusion.  Mild motion artifact. CTA neck: 1. Patent carotid and vertebral arteries. No dissection, aneurysm, or hemodynamically significant stenosis utilizing NASCET criteria. 2. Centrilobular emphysema of lung apices. 3. Mild calcific atherosclerosis of aortic arch, carotid bifurcations, and vertebral arteries. CTA head: 1. Patent anterior and posterior intracranial circulation. No large vessel occlusion, aneurysm, or significant stenosis. 2. 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 These results were called by telephone at the time of interpretation on 03/17/2019 at 11:11 pm to Dr. ASHAmie Portlandwho verbally acknowledged these results. Electronically Signed   By: LanKristine GarbeD.   On: 03/17/2019 23:19   Dg Chest Portable 1 View  Result Date: 03/18/2019 CLINICAL DATA:  Intubated EXAM: PORTABLE CHEST 1 VIEW COMPARISON:  03/17/2019, 08/29/2017 FINDINGS: Endotracheal tube has  been placed, the tip is about 3.3 cm superior to the carina. Esophageal tube tip below the diaphragm but incompletely imaged. No focal airspace disease or effusion. Normal heart size. No pneumothorax IMPRESSION: 1. Endotracheal tube tip about 3.3 cm superior to carina 2. Esophageal tube tip below the diaphragm but incompletely visualized Electronically Signed   By: KimDonavan FoilD.   On: 03/18/2019 00:26   Dg Chest Port 1 View  Result Date: 03/17/2019 CLINICAL DATA:  Aphasia.  Acute mental status change. EXAM: PORTABLE CHEST 1 VIEW COMPARISON:  August 29, 2017 FINDINGS: The heart size and mediastinal contours are within normal limits. Both lungs are clear. The visualized skeletal structures are unremarkable. IMPRESSION: No active disease. Electronically Signed   By: DavDorise BullionI M.D   On: 03/17/2019 23:26   Ct Head Code Stroke Wo Contrast  Result Date: 03/17/2019 CLINICAL DATA:  Code stroke. [age]ar old male with left side weakness last seen normal 2200 hours. EXAM: CT HEAD WITHOUT CONTRAST TECHNIQUE: Contiguous axial images were obtained from the base of the skull through the vertex without intravenous contrast. COMPARISON:  Head CT 06/19/2014. FINDINGS: Study is intermittently degraded by motion artifact despite repeated imaging attempts. Brain: Generalized cerebral volume loss since 2015. Chronic bilateral basal ganglia infarcts appear stable. Progressed and now confluent bilateral cerebral white matter hypodensity. A small left thalamic lacunar infarct also appears stable. No midline shift, mass effect, or evidence of intracranial mass lesion. No acute intracranial hemorrhage identified. No cortically based acute infarct identified. Vascular: Calcified atherosclerosis at the skull base. No suspicious intracranial vascular hyperdensity. Skull: Motion artifact at the skull base. No acute osseous abnormality identified. Sinuses/Orbits: Paranasal sinuses and mastoids appear clear today. Other: No  acute orbit or scalp soft tissue abnormality identified. ASPECTS ([facility] Program Early CT Score) Total score (0-10 with 10 being normal): 10 IMPRESSION: 1. Motion degraded exam with no acute cortically based infarct or acute intracranial hemorrhage identified. 2. ASPECTS is 10. 3. Chronic small vessel disease with progression in the cerebral white matter since 2015. 4. These results were communicated to Dr. AroRory Percy 10:41 pm on 7/4/2020by text page via the [employer]aging system. Electronically Signed   By: H  Genevie AnnD.   On: 03/17/2019 22:41    Medications:  Scheduled: . chlorhexidine gluconate (MEDLINE KIT)  15 mL Mouth  Rinse BID  . Chlorhexidine Gluconate Cloth  6 each Topical Q0600  . enoxaparin (LOVENOX) injection  40 mg Subcutaneous Daily  . fentaNYL      . fentaNYL      . folic acid  1 mg Per Tube Daily  . mouth rinse  15 mL Mouth Rinse 10 times per day  . pantoprazole sodium  40 mg Per Tube Daily  . phenytoin (DILANTIN) IV  100 mg Intravenous Q8H  . sodium chloride flush  3 mL Intravenous Once  . [START ON 03/19/2019] thiamine injection  100 mg Intravenous Daily   Continuous: . sodium chloride    . sodium chloride    . cefTRIAXone (ROCEPHIN)  IV Stopped (03/18/19 0552)  . clevidipine    . fosPHENYtoin (CEREBYX) IV    . levETIRAcetam    . phenylephrine (NEO-SYNEPHRINE) Adult infusion    . propofol    . propofol (DIPRIVAN) infusion 10 mcg/kg/min (03/18/19 0700)    Assessment: 63 year old male presenting with new onset seizures 1. Possibly secondary to EtOH withdrawal given negative EtOH level but with documented alcohol abuse history. Also has a history of prior subdural hematoma and cocaine abuse, both of which would predispose to seizures. His hyponatremia may also be contributing.  2. Had altered mental status and left-sided weakness at time of initial neurology consultation. Most likely secondary to postictal state. 3. Was emergently intubated for airway protection.   4. Clinical course regarding seizures: Received multiple doses of Ativan for CT scan and also loaded with Keppra in the ED. Intermittent facial twitching this AM at about 6:30. Fosphenytoin is being loaded in addition to a supplemental Keppra load of 1000 mg. STAT EEG was ordered.  5. Stroke was ruled out by MRI.   Recommendations: 1. Scheduled phenytoin added to regimen at 100 mg IV TID. Keppra scheduled dose increased to 100 mg IV TID. Also on propofol sedation at 10 mcg/kg/min 2. Management of hyponatremia per critical care medicine- slow correction advised with no more than 10 points in 24 hours for the risk of central pontine myelolysis. 3. Check urinalysis, chest x-ray and urinary toxicology screen. 4. Thiamine IV 5. STAT EEG is pending. 6. Starting on scheduled benzo for possible EtOH withdrawal: Ativan 2 mg IV q6h x 4 doses, then 1 mg IV q6h for 8 doses and then PRN  35 minutes spent in the neurological evaluation and management of this critically ill patient.    LOS: 0 days   @Electronically  signed: Dr. Kerney Elbe 03/18/2019  7:40 AM

## 2019-03-18 NOTE — Progress Notes (Addendum)
RN called- reported intermittent rhythmic facial twitching. EEG ordered stat - tech notified.  Recs: Load with Fospheytoin - 20mg /kg PE eq Additional Keppra 1g load. C/w Keppra 500 BID from 1800hrs Decision on continuing Dilantin based on EEG EEG Neurology will follow.  -- Amie Portland, MD [facility] Pager: [phone] If 7pm to 7am, please call on call as listed on AMION.

## 2019-03-18 NOTE — ED Notes (Signed)
RN, resp tec to MRI

## 2019-03-18 NOTE — Progress Notes (Signed)
Pt's tidal volume was changed to 470 which is 8cc per MD order. Pt's had a minute ventilation of 8.3 before the decrease in tidal volume and 7.6 after the change. Rt will monitor.

## 2019-03-18 NOTE — ED Notes (Signed)
Nephew- frank Doss, [phone] would like an update

## 2019-03-18 NOTE — Progress Notes (Addendum)
eLink Physician-Brief Progress Note Patient Name: Ronald [name] DOB: 12-19-1955 MRN: [id number]   Date of Service  03/18/2019  HPI/Events of Note  63 year old African-American male was brought to the ED by the family for altered mental status.  As per the family the patient developed altered mental status 24 hours back with left-sided gaze.  Admitted with the code stroke  eICU Interventions  CT head negative.  MRI pending.  Patient was intubated for airway protection.  No neck stiffness.  Stroke versus alcohol versus hypertensive encephalopathy.  Notified Dr. Randell Patient.      Intervention Category Major Interventions: Change in mental status - evaluation and management Intermediate Interventions: Communication with other healthcare providers and/or family Evaluation Type: New Patient Evaluation  [name] 03/18/2019, 1:24 AM   4:10 AM Neurologist wants to maintain systolic blood pressure between 140 to 180.  Blood pressure reading on the cuff is variable with systolic between 90 to 299.  Ordered arterial line.  Can use phenylephrine if needed to maintain systolic blood pressure more than 140.

## 2019-03-18 NOTE — Procedures (Signed)
ELECTROENCEPHALOGRAM REPORT   Patient: [name]       Room #: [id number] EEG No. ID: [id number] Age: 63 y.o.        Sex: male Referring Physician: Randell Patient Report Date:  03/18/2019        Interpreting Physician: Alexis Goodell  History: Azell Bill is an 63 y.o. male with altered mental status  Medications:  Diprovan, Cleviprex, Dilantin, Thiamine, Keppra, Rocephin  Conditions of Recording:  This is a 21 channel routine scalp EEG performed with bipolar and monopolar montages arranged in accordance to the international 10/20 system of electrode placement. One channel was dedicated to EKG recording.  The patient is in the intubated and sedated state.  Description:  The background activity is slow and poorly organized consisting mostly of a low voltage polymorphic delta rhythm.  There are superimposed intermittent, short periods of fairly well organized beta activity that resemble symmetrical sleep spindles.   No epileptiform activity is noted.  The patient is stimulated during the recording with no evidence of reactivity noted.  Hyperventilation and intermittent photic stimulation were not performed.  IMPRESSION: This electroencephalogram is characterized by slowing and superimposed beta activity that is consistent with sleep but can not rule out an encephalopathy as possible etiology as well.  No epileptiform activity is noted.     Alexis Goodell, MD Neurology [phone] 03/18/2019, 11:48 AM

## 2019-03-18 NOTE — Consult Note (Signed)
Obtained a PIV with [place], placed a 20ga x 1.88cm, spoke with RN to inform there is not any other options for a PIV at this time, due to his multiple medications if he needs another IV access, it should be a PICC

## 2019-03-18 NOTE — Progress Notes (Signed)
Patient arrived to ICU at 0300 after MRI. Called ED to inquire about any belongings. Waiting for call back.

## 2019-03-18 NOTE — H&P (Signed)
NAME:  Rusty Aus, MRN:  [id number], DOB:  Nov 23, 1955, LOS: 0 ADMISSION DATE:  03/17/2019, CONSULTATION DATE:  03/18/2019 REFERRING MD:  ED, CHIEF COMPLAINT:  Acute encephalopathy  Brief History   63 year old man with history of cocaine, tobacco and alcohol use, gout, hypertension, critical limb ischemia s/p left BKA, remote subdural hematoma presenting with acute encephalopathy, left-sided weakness.  History of present illness   63 year old man with history of cocaine, tobacco and alcohol use, gout, hypertension, critical limb ischemia s/p left BKA, remote subdural hematoma presenting with acute encephalopathy, left-sided weakness.  He was checked on 03/16/2019 and was noted to be encephalopathic at that time.  He was checked and on again the following day and he was found down in urine and less responsive.  He was evaluated by EMS and code stroke called. HPI obtained from EMR, ED due to patient's encephalopathy  Past Medical History  cocaine, tobacco and alcohol use, gout, hypertension, critical limb ischemia s/p left BKA, remote subdural hematoma  Significant Hospital Events   7/5> admit  Consults:  Neuro 7/5>  Procedures:  none  Significant Diagnostic Tests:  CXR 7/5 > No focal airspace disease or effusion. Normal heart size. No pneumothorax.  MR brain w/o 7/5 > No acute intracranial abnormality  CTA head/neck 7/5> Negative CT brain perfusion. Patent carotid and vertebral arteries. No dissection, aneurysm, or hemodynamically significant stenosis. Patent anterior and posterior intracranial circulation. No large vessel occlusion, aneurysm, or significant stenosis. 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   CT head w/o 7/5>no acute cortically based infarct or acute intracranial hemorrhage identified. Chronic small vessel disease with progression in the  cerebral white matter since 2015.   Micro Data:  BCx 7/5> Resp Cx 7/5>  Antimicrobials:  Ceftriaxone 7/5>   Interim history/subjective:  Intubated  Objective   Blood pressure 90/70, pulse (!) 119, temperature (!) 102 F (38.9 C), temperature source Rectal, resp. rate (!) 6, SpO2 100 %.    Vent Mode: PRVC FiO2 (%):  [100 %] 100 % Set Rate:  [16 bmp] 16 bmp Vt Set:  [530 mL] 530 mL PEEP:  [5 cmH20] 5 cmH20 Plateau Pressure:  [17 cmH20] 17 cmH20  No intake or output data in the 24 hours ending 03/18/19 0133 There were no vitals filed for this visit.  Examination: General: NAD, sedated HENT: ETT in place, poor dentition Lungs: Rhonchi in right base Cardiovascular: RRR Abdomen: soft, nondistended Extremities: L BKA Neuro: Sedated, unresponsive   Assessment & Plan:  63 year old man with history of cocaine, tobacco and alcohol use, gout, hypertension, critical limb ischemia s/p left BKA, remote subdural hematoma presenting with acute encephalopathy, left-sided weakness. He received 1mg  Ativan, Keppra 1000mg  in ED.  Acute encephalopathy, hypertensive emergency, hyponatremia, possible alcohol withdrawal related seizure: BP initially 205/183 in ED. Trop neg. EtOH neg. CK normal. EKG with new T wave changes and possible ST depressions. --Neuro following, appreciate recs --Cleviprex gtt for goal MAP < 130. --Trend trops, repeat EKG in AM --BMP q4hr. Free water restriction. Goal increase of Na to 128 over next 24 hours.  --Obtain urine sodium, osm, serum osm --ativan prn seizure, seizure precautions --IV thiamine, folate --Continue keppra 500mg  BID --EEG in AM --UA, UDS pending  Fever: Fever to 102 in ED. No lactic acidosis. COVID neg. Mild leukocytosis to 13.3 --Empiric ceftriaxone for now --Obtain cultures  Acute hypercarbic respiratory failure: blood gas with pH  7.31/50.1. --Continue to monitor blood gas and adjust minute ventilation for goal pH >7.3  Best practice:   Diet: NPO Pain/Anxiety/Delirium protocol (if indicated): fent prn, propofol gtt VAP protocol (if indicated): ordered DVT prophylaxis: SubQ Lovenox GI prophylaxis: PPI Glucose control: monitor Mobility: PT when able Code Status: FULL Family Communication: none at bedside Disposition: ICU  Labs   CBC: Recent Labs  Lab 03/17/19 2213 03/17/19 2219 03/18/19 0038  WBC 13.3*  --   --   NEUTROABS 11.1*  --   --   HGB 13.9 15.3 12.9*  HCT 37.7* 45.0 38.0*  MCV 91.3  --   --   PLT 291  --   --     Basic Metabolic Panel: Recent Labs  Lab 03/17/19 2213 03/17/19 2217 03/17/19 2219 03/18/19 0038  NA 120*  --  120* 120*  K 3.7  --  3.7 3.8  CL 82*  --  83*  --   CO2 22  --   --   --   GLUCOSE 105*  --  105*  --   BUN 10  --  10  --   CREATININE 0.88  --  0.60*  --   CALCIUM 9.6  --   --   --   MG  --  1.8  --   --    GFR: CrCl cannot be calculated (Unknown ideal weight.). Recent Labs  Lab 03/17/19 2213 03/18/19 0013  WBC 13.3*  --   LATICACIDVEN  --  1.3    Liver Function Tests: Recent Labs  Lab 03/17/19 2213  AST 32  ALT 26  ALKPHOS 57  BILITOT 1.8*  PROT 8.2*  ALBUMIN 4.8   ABG    Component Value Date/Time   PHART 7.308 (L) 03/18/2019 0038   PCO2ART 50.1 (H) 03/18/2019 0038   PO2ART 329.0 (H) 03/18/2019 0038   HCO3 24.5 03/18/2019 0038   TCO2 26 03/18/2019 0038   ACIDBASEDEF 2.0 03/18/2019 0038   O2SAT 100.0 03/18/2019 0038     Coagulation Profile: Recent Labs  Lab 03/17/19 2213  INR 1.1    Cardiac Enzymes: Recent Labs  Lab 03/17/19 2217  CKTOTAL 341    HbA1C: Hgb A1c MFr Bld  Date/Time Value Ref Range Status  11/09/2013 01:00 PM 5.9 (H) <5.7 % Final    Comment:    (NOTE)                                                                       According to the ADA Clinical Practice Recommendations for 2011, when HbA1c is used as a screening test:  >=6.5%   Diagnostic of Diabetes Mellitus           (if abnormal result is confirmed)  5.7-6.4%   Increased risk of developing Diabetes Mellitus References:Diagnosis and Classification of Diabetes Mellitus,Diabetes Care,2011,34(Suppl 1):S62-S69 and Standards of Medical Care in         Diabetes - 2011,Diabetes Care,2011,34 (Suppl 1):S11-S61.    CBG: Recent Labs  Lab 03/17/19 2250  GLUCAP 104*    Review of Systems:   Unable to obtain due to encephalopathy  Past Medical History  He,  has a past medical history of Critical lower limb ischemia, ETOH abuse, Gout, and Hypertension.  Surgical History    Past Surgical History:  Procedure Laterality Date  . ABDOMINAL AORTAGRAM N/A 12/26/2013   Procedure: ABDOMINAL [name];  Surgeon: Nada LibmanVance W Brabham, MD;  [facility];  Service: Cardiovascular;  Laterality: N/A;  . AMPUTATION Left 12/28/2013   Procedure: LEFT BELOW KNEE AMPUTATION;  Surgeon: [name], MD;  Location: [facility] OR;  Service: Orthopedics;  Laterality: Left;  . BELOW KNEE LEG AMPUTATION     Left  . LEG SURGERY       Social History   reports that he has been smoking cigarettes. He has a 20.00 pack-year smoking history. He has never used smokeless tobacco. He reports current alcohol use of about 120.0 standard drinks of alcohol per week. He reports current drug use. Drugs: Cocaine and Marijuana.   Family History   His family history includes Diabetes Mellitus II in his mother. There is no history of CAD or Stroke.   Allergies No Known Allergies   Home Medications  Prior to Admission medications   Medication Sig Start Date End Date Taking? Authorizing Provider  azithromycin (ZITHROMAX) 250 MG tablet Take 1 tablet (250 mg total) by mouth daily. 09/01/17   Camelia PhenesHoffman, Jessica Ratliff, DO  folic acid (FOLVITE) 1 MG tablet Take 1 tablet (1 mg total) by mouth daily. Patient not taking: Reported on 08/29/2017 06/19/14   Edsel PetrinMikhail, Maryann, DO  ibuprofen (ADVIL,MOTRIN) 200 MG tablet Take 400 mg by mouth every 6 (six) hours as needed for headache or mild pain.     [provider]  Multiple Vitamin (MULTIVITAMIN WITH MINERALS) TABS tablet Take 1 tablet by mouth daily. Patient not taking: Reported on 01/23/2017 06/19/14   Edsel PetrinMikhail, Maryann, DO  potassium chloride SA (K-DUR,KLOR-CON) 20 MEQ tablet Take 1 tablet (20 mEq total) by mouth 2 (two) times daily. Patient not taking: Reported on 08/29/2017 01/23/17   Elpidio AnisUpstill, Shari, PA-C  predniSONE (DELTASONE) 20 MG tablet Take 2 tablets (40 mg total) by mouth daily. 09/01/17   Geralyn CorwinHoffman, Jessica Ratliff, DO  thiamine 100 MG tablet Take 1 tablet (100 mg total) by mouth daily. Patient not taking: Reported on 01/23/2017 06/19/14   Edsel PetrinMikhail, Maryann, DO  triamterene-hydrochlorothiazide (MAXZIDE-25) 37.5-25 MG per tablet Take 1 tablet by mouth daily. 09/26/13   Rodolph Bonghompson, Daniel V, MD     Critical care time: The patient is critically ill with multiple organ systems failure and requires high complexity decision making for assessment and support, frequent evaluation and titration of therapies, application of advanced monitoring technologies and extensive interpretation of multiple databases.   Critical Care Time devoted to patient care services described in this note is  55 Minutes. This time reflects time of care of this signee. This critical care time does not reflect procedure time, or teaching time or supervisory time of PA/NP/Med student/Med Resident etc but could involve care discussion time.  Griffin BasilJennifer Krall, M.D. [facility] After hours pager: [phone].

## 2019-03-18 NOTE — Procedures (Signed)
Arterial Catheter Insertion Procedure Note [name] [id number] 1956-08-22  Procedure: Insertion of Arterial Catheter  Indications: Blood pressure monitoring  Procedure Details Consent: Risks of procedure as well as the alternatives and risks of each were explained to the (patient/caregiver).  Consent for procedure obtained. and Unable to obtain consent because of emergent medical necessity. Time Out: Verified patient identification, verified procedure, site/side was marked, verified correct patient position, special equipment/implants available, medications/allergies/relevent history reviewed, required imaging and test results available.  Performed  Maximum sterile technique was used including antiseptics, cap, gloves, gown, hand hygiene, mask and sheet. Skin prep: Chlorhexidine; local anesthetic administered 20 gauge catheter was inserted into right radial artery using the Seldinger technique. ULTRASOUND GUIDANCE USED: NO Evaluation Blood flow good; BP tracing good. Complications: No apparent complications.   Myrtis Ser 03/18/2019

## 2019-03-18 NOTE — Progress Notes (Signed)
Patient's lower face rhythmically twitching, left eye dysconjugate and drifts upward. Dr. Rory Percy notified. New orders received.

## 2019-03-18 NOTE — Progress Notes (Signed)
Confirmed with Dr. Rory Percy that goal BP 140-180

## 2019-03-18 NOTE — Progress Notes (Signed)
STAT EEG completed; results pending; notified Dr Cheral Marker.

## 2019-03-18 NOTE — Progress Notes (Signed)
Received 1 shoe from ED. Only personal belonging in bag.

## 2019-03-18 NOTE — Progress Notes (Signed)
Pt transported to MRI and then to 4N ICU without complications.

## 2019-03-19 ENCOUNTER — Inpatient Hospital Stay: Payer: Self-pay

## 2019-03-19 ENCOUNTER — Inpatient Hospital Stay (HOSPITAL_COMMUNITY): Payer: Medicaid Other

## 2019-03-19 DIAGNOSIS — E871 Hypo-osmolality and hyponatremia: Secondary | ICD-10-CM

## 2019-03-19 DIAGNOSIS — R9431 Abnormal electrocardiogram [ECG] [EKG]: Secondary | ICD-10-CM

## 2019-03-19 DIAGNOSIS — G9340 Encephalopathy, unspecified: Secondary | ICD-10-CM

## 2019-03-19 DIAGNOSIS — I952 Hypotension due to drugs: Secondary | ICD-10-CM

## 2019-03-19 LAB — ECHOCARDIOGRAM COMPLETE
Height: 64 in
Weight: 2511.48 oz

## 2019-03-19 LAB — GLUCOSE, CAPILLARY
Glucose-Capillary: 100 mg/dL — ABNORMAL HIGH (ref 70–99)
Glucose-Capillary: 100 mg/dL — ABNORMAL HIGH (ref 70–99)
Glucose-Capillary: 102 mg/dL — ABNORMAL HIGH (ref 70–99)
Glucose-Capillary: 110 mg/dL — ABNORMAL HIGH (ref 70–99)
Glucose-Capillary: 110 mg/dL — ABNORMAL HIGH (ref 70–99)
Glucose-Capillary: 112 mg/dL — ABNORMAL HIGH (ref 70–99)

## 2019-03-19 LAB — BASIC METABOLIC PANEL
Anion gap: 11 (ref 5–15)
Anion gap: 10 (ref 5–15)
BUN: 6 mg/dL — ABNORMAL LOW (ref 8–23)
BUN: 6 mg/dL — ABNORMAL LOW (ref 8–23)
CO2: 22 mmol/L (ref 22–32)
CO2: 22 mmol/L (ref 22–32)
Calcium: 8.1 mg/dL — ABNORMAL LOW (ref 8.9–10.3)
Calcium: 8.2 mg/dL — ABNORMAL LOW (ref 8.9–10.3)
Chloride: 97 mmol/L — ABNORMAL LOW (ref 98–111)
Chloride: 99 mmol/L (ref 98–111)
Creatinine, Ser: 0.73 mg/dL (ref 0.61–1.24)
Creatinine, Ser: 0.62 mg/dL (ref 0.61–1.24)
GFR calc Af Amer: >60 mL/min (ref >60)
GFR calc Af Amer: >60 mL/min (ref >60)
GFR calc non Af Amer: >60 mL/min (ref >60)
GFR calc non Af Amer: >60 mL/min (ref >60)
Glucose, Bld: 105 mg/dL — ABNORMAL HIGH (ref 70–99)
Glucose, Bld: 102 mg/dL — ABNORMAL HIGH (ref 70–99)
Potassium: 3 mmol/L — ABNORMAL LOW (ref 3.5–5.1)
Potassium: 3.4 mmol/L — ABNORMAL LOW (ref 3.5–5.1)
Sodium: 130 mmol/L — ABNORMAL LOW (ref 135–145)
Sodium: 131 mmol/L — ABNORMAL LOW (ref 135–145)

## 2019-03-19 LAB — TRIGLYCERIDES: Triglycerides: 55 mg/dL (ref <150)

## 2019-03-19 LAB — CBC
HCT: 29.3 % — ABNORMAL LOW (ref 39.0–52.0)
Hemoglobin: 10.3 g/dL — ABNORMAL LOW (ref 13.0–17.0)
MCH: 34.1 pg — ABNORMAL HIGH (ref 26.0–34.0)
MCHC: 35.2 g/dL (ref 30.0–36.0)
MCV: 97 fL (ref 80.0–100.0)
Platelets: 238 10*3/uL (ref 150–400)
RBC: 3.02 MIL/uL — ABNORMAL LOW (ref 4.22–5.81)
RDW: 13.2 % (ref 11.5–15.5)
WBC: 8.2 10*3/uL (ref 4.0–10.5)
nRBC: 0 % (ref 0.0–0.2)

## 2019-03-19 LAB — PHOSPHORUS: Phosphorus: 2.9 mg/dL (ref 2.5–4.6)

## 2019-03-19 LAB — MAGNESIUM: Magnesium: 2 mg/dL (ref 1.7–2.4)

## 2019-03-19 MED ORDER — PRO-STAT SUGAR FREE PO LIQD
30.0000 mL | Freq: Two times a day (BID) | ORAL | Status: DC
  Administered 2019-03-19 (×2): 30 mL
  Filled 2019-03-19 (×2): qty 30

## 2019-03-19 MED ORDER — DEXMEDETOMIDINE HCL IN NACL 200 MCG/50ML IV SOLN
0.4000 ug/kg/h | INTRAVENOUS | Status: DC
  Administered 2019-03-19: 0.6 ug/kg/h via INTRAVENOUS
  Administered 2019-03-19: 0.5 ug/kg/h via INTRAVENOUS
  Administered 2019-03-19: 0.4 ug/kg/h via INTRAVENOUS
  Administered 2019-03-20: 03:00:00 0.6 ug/kg/h via INTRAVENOUS
  Filled 2019-03-19 (×5): qty 50

## 2019-03-19 MED ORDER — VITAL AF 1.2 CAL PO LIQD
1000.0000 mL | ORAL | Status: DC
  Administered 2019-03-19: 13:00:00 1000 mL

## 2019-03-19 MED ORDER — POTASSIUM CHLORIDE 10 MEQ/100ML IV SOLN
10.0000 meq | INTRAVENOUS | Status: AC
  Administered 2019-03-19 (×3): 10 meq via INTRAVENOUS
  Filled 2019-03-19 (×3): qty 100

## 2019-03-19 MED ORDER — CHLORHEXIDINE GLUCONATE CLOTH 2 % EX PADS
6.0000 | MEDICATED_PAD | Freq: Every day | CUTANEOUS | Status: DC
  Administered 2019-03-19 – 2019-03-21 (×3): 6 via TOPICAL

## 2019-03-19 MED ORDER — SODIUM CHLORIDE 0.9% FLUSH: 10.0000 mL | INTRAVENOUS | Status: DC | PRN

## 2019-03-19 MED ORDER — SODIUM CHLORIDE 0.9% FLUSH
10.0000 mL | Freq: Two times a day (BID) | INTRAVENOUS | Status: DC
  Administered 2019-03-19: 10 mL
  Administered 2019-03-19: 20 mL
  Administered 2019-03-20: 10 mL
  Administered 2019-03-20: 20 mL
  Administered 2019-03-21 – 2019-03-22 (×3): 10 mL

## 2019-03-19 MED ORDER — LORAZEPAM 2 MG/ML IJ SOLN
1.0000 mg | Freq: Four times a day (QID) | INTRAMUSCULAR | Status: DC | PRN
  Administered 2019-03-20: 1 mg via INTRAVENOUS
  Filled 2019-03-19: qty 1

## 2019-03-19 NOTE — Progress Notes (Signed)
Attempted to call sister Chidera Thivierge for PICC consent.  No answer, voicemail message left.  Carolee Rota, RN VAST

## 2019-03-19 NOTE — H&P (Signed)
NAME:  Ronald [name], MRN:  [id number], DOB:  1956-09-01, LOS: 1 ADMISSION DATE:  03/17/2019, CONSULTATION DATE:  03/18/2019 REFERRING MD:  ED, CHIEF COMPLAINT:  Acute encephalopathy  Brief History   63 year old man with history of cocaine, tobacco and alcohol use, gout, hypertension, critical limb ischemia s/p left BKA, remote subdural hematoma presenting with acute encephalopathy, left-sided weakness. Now with seizure-like activity on LTM EEG.   History of present illness   63 year old man with history of cocaine, tobacco and alcohol use, gout, hypertension, critical limb ischemia s/p left BKA, remote subdural hematoma presenting with acute encephalopathy, left-sided weakness.  He was checked on 03/16/2019 and was noted to be encephalopathic at that time.  He was checked and on again the following day and he was found down in urine and less responsive.  He was evaluated by EMS and code stroke called. HPI obtained from EMR, ED due to patient's encephalopathy  Past Medical History  Cocaine, tobacco and alcohol use, gout, hypertension, critical limb ischemia s/p left BKA, remote subdural hematoma  Significant Hospital Events   7/5> admit  Consults:  Neuro 7/5>  Procedures:  7/5 EEG >> No seizure activity  Significant Diagnostic Tests:  CXR 7/5 > No focal airspace disease or effusion. Normal heart size. No pneumothorax.  MR brain w/o 7/5 > No acute intracranial abnormality  CTA head/neck 7/5> Negative CT brain perfusion. Patent carotid and vertebral arteries. No dissection, aneurysm, or hemodynamically significant stenosis. Patent anterior and posterior intracranial circulation. No large vessel occlusion, aneurysm, or significant stenosis. 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   CT head w/o 7/5>no acute cortically based infarct or acute intracranial hemorrhage  identified. Chronic small vessel disease with progression in the cerebral white matter since 2015.   Micro Data:  SARS CoV 2 & MRSA 7/5 - Negative BCx 7/5> Resp Cx 7/5>  Antimicrobials:  Ceftriaxone 7/5>   Interim history/subjective:  Pt was agitated and reaching for ET tube overnight, now restrained and sedated on Ativan.  Pt remains critically ill and intubated, requiring pressors.  Objective   Blood pressure 135/86, pulse 73, temperature 99.5 F (37.5 C), temperature source Axillary, resp. rate 16, height 5\' 4"  (1.626 m), weight 71.2 kg, SpO2 99 %.    Vent Mode: PRVC FiO2 (%):  [30 %-40 %] 30 % Set Rate:  [16 bmp] 16 bmp Vt Set:  [470 mL] 470 mL PEEP:  [5 cmH20] 5 cmH20 Plateau Pressure:  [13 cmH20-14 cmH20] 13 cmH20   Intake/Output Summary (Last 24 hours) at 03/19/2019 1540 Last data filed at 03/19/2019 1500 Gross per 24 hour  Intake 3198.02 ml  Output 1680 ml  Net 1518.02 ml   Filed Weights   03/18/19 0248 03/19/19 0500  Weight: 67.7 kg 71.2 kg    Examination: General: Ill-appearing male,sedated, and mechanically ventilated. HENT: Pupils equal. Sluggishly reactive to light. ETT in place. Lungs: CTA anteriorly Cardiovascular: RRR. No m/r/g Abdomen: soft, nondistended. +BS Extremities: L BKA. Warm. No peripheral edema. Neuro: Sedated, unresponsive   Assessment & Plan:   Neurological Acute Encephalopathy: Possibly related to hypertensive emergency, hyponatremia, ?alcohol withdrawal-related seizure. BP initially 205/183 in ED. Trop neg. EtOH neg. UDS positive for cocaine. CK normal. EKG with new T wave changes and possible ST depressions. Concern for seizure-like activity. - Neuro following, recs appreciated - Stat EEG 7/5 without seizure activity - f/u LTM EEG - Continue phenytoin 100mg  TID,  keppra 100mg  TID - Ativan PRN seizure - Seizure precautions - IV Thiamine, Folate - Hyponatremia management as below  Cardiovascular Hypotension: Hxo HTN, but  hypotensive s/p propofol. - Taper off Neo-Synephrine - f/u ECHO - CTM  Respiratory Acute Hypercarbic respiratory Failure: - 7/5 pH 7.308, pCO2 50.1, pO2 329 - Vent settings reviewed and adjusted  Infectious Fever  Leukocytosis: Febrile to 102 on admission with leukocytosis to 13.3. COVID negative.  - WBC 8.2<8.0<13.3 - CXR showing bibasilar infiltrates - Blood Cx - NG24hrs - f/u Respiratory cultures - Ceftriaxone (7/5- )  Electrolytes Hyponatremia: Na+ at 120 on admission.  - Na+ 120>131 over past 36hrs - Continue autocorrection  Hypokalemia: - replete PRN   Best practice:  Diet: NPO Pain/Anxiety/Delirium protocol (if indicated): fent prn, propofol gtt VAP protocol (if indicated): per protocol DVT prophylaxis: SubQ Lovenox GI prophylaxis: PPI Glucose control: monitor Mobility: PT when able Code Status: FULL Family Communication: Sister 7/5 Disposition: ICU  Critical care time: 35 minutes     -- Orson Apeiffany Carvel Huskins, [facility] 03/19/2019

## 2019-03-19 NOTE — Progress Notes (Signed)
Peripherally Inserted Central Catheter/Midline Placement  The IV Nurse has discussed with the patient and/or persons authorized to consent for the patient, the purpose of this procedure and the potential benefits and risks involved with this procedure.  The benefits include less needle sticks, lab draws from the catheter, and the patient may be discharged home with the catheter. Risks include, but not limited to, infection, bleeding, blood clot (thrombus formation), and puncture of an artery; nerve damage and irregular heartbeat and possibility to perform a PICC exchange if needed/ordered by physician.  Alternatives to this procedure were also discussed.  Bard Power PICC patient education guide, fact sheet on infection prevention and patient information card has been provided to patient /or left at bedside.    PICC/Midline Placement Documentation  PICC Double Lumen 03/19/19 PICC Right Brachial 39 cm 0 cm (Active)  Indication for Insertion or Continuance of Line Vasoactive infusions 03/19/19 1400  Exposed Catheter (cm) 0 cm 03/19/19 1400  Site Assessment Clean;Dry;Intact 03/19/19 1400  Lumen #1 Status Flushed;Blood return noted 03/19/19 1400  Lumen #2 Status Flushed;Blood return noted 03/19/19 1400  Dressing Type Transparent 03/19/19 1400  Dressing Status Clean;Dry;Intact;Antimicrobial disc in place 03/19/19 1400  Dressing Change Due 03/26/19 03/19/19 1400       Jule Economy Horton 03/19/2019, 2:11 PM

## 2019-03-19 NOTE — Progress Notes (Signed)
NAME:  Maryln ManuelNebraska Caskey, MRN:  [id number], DOB:  11-28-1955, LOS: 1 ADMISSION DATE:  03/17/2019, CONSULTATION DATE:  03/18/2019 REFERRING MD:  ED, CHIEF COMPLAINT:  Acute encephalopathy  Brief History   63 year old man with history of cocaine, tobacco and alcohol use, gout, hypertension, critical limb ischemia s/p left BKA, remote subdural hematoma presenting with acute encephalopathy, left-sided weakness. LTM EEG neg for seizure,   Past Medical History  cocaine, tobacco and alcohol use, gout, hypertension, critical limb ischemia s/p left BKA, remote subdural hematoma  Significant Hospital Events   7/5> admit  Consults:  Neuro 7/5>  Procedures:  EEG 7/5 >> no seizure activity  Significant Diagnostic Tests:   MR brain w/o 7/5 > No acute intracranial abnormality  CTA head/neck 7/5> Negative CT brain perfusion. Patent carotid and vertebral arteries. No dissection, aneurysm, or hemodynamically significant stenosis. Patent anterior and posterior intracranial circulation. No large vessel occlusion, aneurysm, or significant stenosis. 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   CT head w/o 7/5>no acute cortically based infarct or acute intracranial hemorrhage identified. Chronic small vessel disease with progression in the cerebral white matter since 2015.   Micro Data:  BCx 7/5> Resp Cx 7/5>  Antimicrobials:  Ceftriaxone 7/5>>  Interim history/subjective:   Critically ill, remains on Neo-Synephrine Sedated with intermittent Ativan, was reaching for the tube overnight and required restraints Afebrile last 24 hours Adequate urine output   Objective   Blood pressure 124/74, pulse 95, temperature 99.4 F (37.4 C), temperature source Axillary, resp. rate 16, height 5\' 4"  (1.626 m), weight 71.2 kg, SpO2 94 %.    Vent Mode: PRVC FiO2 (%):  [30 %-40 %] 30 %  Set Rate:  [16 bmp] 16 bmp Vt Set:  [470 mL] 470 mL PEEP:  [5 cmH20] 5 cmH20 Plateau Pressure:  [13 cmH20-15 cmH20] 14 cmH20   Intake/Output Summary (Last 24 hours) at 03/19/2019 1151 Last data filed at 03/19/2019 0800 Gross per 24 hour  Intake 3665.52 ml  Output 1680 ml  Net 1985.52 ml   Filed Weights   03/18/19 0248 03/19/19 0500  Weight: 67.7 kg 71.2 kg    Examination: General: NAD, sedated, chronically ill-appearing HENT: ETT in place, poor dentition, mild pallor, no icterus Lungs: Decreased breath sounds bilateral Cardiovascular: RRR Abdomen: soft, nondistended Extremities: L BKA Neuro: Sedated, unresponsive , grossly nonfocal   Chest x-ray 7/6 personally reviewed which shows streaky infiltrates both bases?  Atelectasis   Assessment & Plan:  63 year old man with history of cocaine, tobacco and alcohol use, gout, hypertension, critical limb ischemia s/p left BKA, remote subdural hematoma presenting with acute encephalopathy, left-sided weakness. He received 1mg  Ativan, Keppra 1000mg  in ED.  Acute encephalopathy, hypertensive emergency, hyponatremia, possible alcohol withdrawal related seizure: BP initially 205/183 in ED. Trop neg. EtOH neg. UDS + for cocaine EKG with new T wave changes and possible ST depressions. --Neuro following, appreciate recs --ativan prn seizure, seizure precautions -Precedex if needed, goal RA SS 0 to -1 --IV thiamine, folate --Continue keppra 500mg  BID  Acute hypercarbic respiratory failure -start spontaneous breathing trials  Hypotension -related initially to propofol, obtain echo for completion, troponins negative Taper Neo-Synephrine to off  Hyponatremia -has corrected by 11 over last 36 hours, continue to let it AutoCorrect HypoKalemia will be repleted  Fever: Fever to 102 in ED. No lactic acidosis. COVID neg. Mild leukocytosis to 13.3 --Empiric ceftriaxone for now --await cultures  Best practice:  Diet: NPO Pain/Anxiety/Delirium  protocol (if indicated): fent prn, propofol gtt VAP protocol (if indicated): ordered DVT prophylaxis: SubQ Lovenox GI prophylaxis: PPI Glucose control: monitor Mobility: PT when able Code Status: FULL Family Communication:sister 7/5 Disposition: ICU  The patient is critically ill with multiple organ systems failure and requires high complexity decision making for assessment and support, frequent evaluation and titration of therapies, application of advanced monitoring technologies and extensive interpretation of multiple databases. Critical Care Time devoted to patient care services described in this note independent of APP/resident  time is 33 minutes.    Kara Mead MD. Shade Flood. [facility] Pager [phone] If no response call [phone]   03/19/2019

## 2019-03-19 NOTE — Progress Notes (Signed)
Initial Nutrition Assessment  DOCUMENTATION CODES:   Not applicable  INTERVENTION:   Vital AF 1.2  @ 50 ml/hr (1200 ml)  via OG tube 30 ml Prostat BID  Provides: 1640 kcal, 120 grams protein, and 973 ml free water.    NUTRITION DIAGNOSIS:   Moderate Malnutrition related to chronic illness(drug/ETOH abuse) as evidenced by moderate fat depletion, moderate muscle depletion.  GOAL:   Patient will meet greater than or equal to 90% of their needs  MONITOR:   TF tolerance, Labs, Vent status  REASON FOR ASSESSMENT:   Consult, Ventilator Enteral/tube feeding initiation and management  ASSESSMENT:   Pt with PMH of cocaine, tobacco, and ETOH abuse, HTN, critial limb ischemia s/p L BKA, remote SDH now admitted 7/5 with acute encephalopathy and L sided weakness. Possible alcohol withdrawal seizures.   Patient is currently intubated on ventilator support MV: 7.1 L/min Temp (24hrs), Avg:98.9 F (37.2 C), Min:98.2 F (36.8 C), Max:99.4 F (37.4 C)  Medications reviewed and include: folic acid, thiamine  Neo @ 30 mcg  Labs reviewed: Na 131 (L), K+ 3.4 (L)    NUTRITION - FOCUSED PHYSICAL EXAM:     Most Recent Value  Orbital Region  Mild depletion  Upper Arm Region  Moderate depletion  Thoracic and Lumbar Region  Moderate depletion  Buccal Region  Unable to assess  Temple Region  Moderate depletion  Clavicle Bone Region  Mild depletion  Clavicle and Acromion Bone Region  Mild depletion  Scapular Bone Region  Unable to assess  Dorsal Hand  No depletion  Patellar Region  Severe depletion  Anterior Thigh Region  Severe depletion  Posterior Calf Region  Severe depletion  Edema (RD Assessment)  None  Hair  Reviewed  Eyes  Unable to assess  Mouth  Unable to assess  Skin  Reviewed  Nails  Reviewed       Diet Order:   Diet Order            Diet NPO time specified  Diet effective now              EDUCATION NEEDS:   No education needs have been identified at  this time  Skin:  Skin Assessment: Reviewed RN Assessment  Last BM:  unknown  Height:   Ht Readings from Last 1 Encounters:  03/18/19 5\' 4"  (1.626 m)    Weight:   Wt Readings from Last 1 Encounters:  03/19/19 71.2 kg    Ideal Body Weight:  55 kg  BMI:  28.7 (overweight)  Estimated Nutritional Needs:   Kcal:  1629  Protein:  110-130 grams  Fluid:  > 1.6 L/day  Maylon Peppers RD, LDN, CNSC [phone] Pager [phone] After Hours Pager

## 2019-03-19 NOTE — Progress Notes (Signed)
[facility] ADULT ICU REPLACEMENT PROTOCOL FOR AM LAB REPLACEMENT ONLY  The patient does apply for the [facility] Adult ICU Electrolyte Replacment Protocol based on the criteria listed below:   1. Is GFR >/= 40 ml/min? Yes.    Patient's GFR today is >60 2. Is urine output >/= 0.5 ml/kg/hr for the last 6 hours? Yes.   Patient's UOP is 1.3 ml/kg/hr 3. Is BUN < 60 mg/dL? Yes.    Patient's BUN today is 6 4. Abnormal electrolyte(s): k 3.0 5. Ordered repletion with: protocol 6. If a panic level lab has been reported, has the CCM MD in charge been notified? No..   Physician:    Ronda Fairly A 03/19/2019 1:46 AM

## 2019-03-19 NOTE — Progress Notes (Signed)
[facility] Progress Note Patient Name: [name] DOB: 25-Mar-1956 MRN: [id number]   Date of Service  03/19/2019  HPI/Events of Note  Agitation - Patient reaching for ETT. Request for R soft wrist restraint.   eICU Interventions  Will order: 1. R soft wrist restraint.     Intervention Category Major Interventions: Delirium, psychosis, severe agitation - evaluation and management  Sommer,Steven Eugene 03/19/2019, 4:56 AM

## 2019-03-19 NOTE — Progress Notes (Signed)
Restrain order renew per verbal order from MD.

## 2019-03-19 NOTE — Progress Notes (Signed)
  Echocardiogram 2D Echocardiogram has been performed.  Ronald [name] 03/19/2019, 4:14 PM

## 2019-03-19 NOTE — Progress Notes (Addendum)
Subjective: [name] was laying in bed, intubated, not alert. Intermittently following commands.  With intermittent agitation.   Exam: Vitals:   03/19/19 0900 03/19/19 1000  BP: 105/71 124/74  Pulse: 83 95  Resp: 16 16  Temp:    SpO2: 97% 94%   Gen: In bed, NAD Resp: non-labored breathing, no acute distress Abd: soft, nt  Neuro: MS: Not awake or alert, intubated, not on sedation. Unable to assess speech. Intermittently following commands. Cranial Nerves: II: Pupils equal, minimal reaction to light, no blink to threat III,IV, VI: Not opening eyes spontaneously, some right ward gaze preference, able to look right and left, limited due to intermitently following commands V,VII: Intubated, no obvious evidence of asymmetry noted VIII: Was able to follow some commands IX,X: Intubated  XI, XII: unable to assess Motor: Was moving right leg spontaneously, able to move all extremities. Flaccid extremities. LLE s/p BKA.  Sensory: Some movement with painful stimulation Deep Tendon Reflexes:  1+ BL Upper extremities  Pertinent Labs:  UDS positive for cocaine and benzodiazepine  Impression:  This is a 63 year old male with a history of cocaine use, tobacco and alcohol use, gout, HTN, critical leg ischemia s/p left BKA, remote subdural hematoma who presented with acute encephalopathy and left sided weakness. Noted to have new onset seizure activity including left arm posturing and left gaze deviation. He was loaded with keppra. Yesterday he was noted to have intermittent facial twitching and he was loaded with fosphenytoin.  Unclear cause of the new onset seizures, patient is noted to have UDS positive for cocaine which may have been the cause. Could also be related to his hyponatremia.  MRI showed no acute intracranial abnormality, progression of chronic microvascular ischemic changes, chronic lacunar infarcts in basal ganglia.  EEG showed slowing and superimposed beta activity that is  consistent with sleep, can not rule out encephalopathy. No epileptiform activity noted.  Treated with phenytoin at 100 mg IV TID, keppra 100 mg IV TID and propofol sedation. Patient is now off propofol sedation. Seizure activity has improved. Occasionally following commands today, seems to have some improvement in mental status.   Recommendations: 1) Continue phenytoin 100 mg TID 2) Continue keppra 1000 mg BID 3) Continue correcting hyponatremia per primary 4) Seizure precautions   Ronald [name], M.D. PGY2 03/19/2019 11:52 AM

## 2019-03-19 NOTE — Progress Notes (Signed)
Left forearm IV site that had normal saline KVO, rocephin IV piggyback, and then potassium chloride 10 mEq per 100 mL IV piggyback running looked puffy and infiltrated. Potassium chloride had infused approximately 30 mL. Infusion stopped, IV removed, arm elevated, and ice pack applied. This RN called pharmacy and spoke to a pharmacist to verify that no other special needs are required for rocephin or potassium chloride infiltration. Will continue to monitor patient and site.

## 2019-03-20 ENCOUNTER — Inpatient Hospital Stay (HOSPITAL_COMMUNITY): Payer: Medicaid Other

## 2019-03-20 DIAGNOSIS — J9602 Acute respiratory failure with hypercapnia: Secondary | ICD-10-CM

## 2019-03-20 DIAGNOSIS — J13 Pneumonia due to Streptococcus pneumoniae: Secondary | ICD-10-CM

## 2019-03-20 LAB — GLUCOSE, CAPILLARY
Glucose-Capillary: 104 mg/dL — ABNORMAL HIGH (ref 70–99)
Glucose-Capillary: 105 mg/dL — ABNORMAL HIGH (ref 70–99)
Glucose-Capillary: 129 mg/dL — ABNORMAL HIGH (ref 70–99)
Glucose-Capillary: 92 mg/dL (ref 70–99)
Glucose-Capillary: 93 mg/dL (ref 70–99)
Glucose-Capillary: 95 mg/dL (ref 70–99)

## 2019-03-20 LAB — CBC
HCT: 29.3 % — ABNORMAL LOW (ref 39.0–52.0)
Hemoglobin: 10.2 g/dL — ABNORMAL LOW (ref 13.0–17.0)
MCH: 34.5 pg — ABNORMAL HIGH (ref 26.0–34.0)
MCHC: 34.8 g/dL (ref 30.0–36.0)
MCV: 99 fL (ref 80.0–100.0)
Platelets: 238 10*3/uL (ref 150–400)
RBC: 2.96 MIL/uL — ABNORMAL LOW (ref 4.22–5.81)
RDW: 13.4 % (ref 11.5–15.5)
WBC: 7 10*3/uL (ref 4.0–10.5)
nRBC: 0 % (ref 0.0–0.2)

## 2019-03-20 LAB — BASIC METABOLIC PANEL
Anion gap: 9 (ref 5–15)
BUN: 8 mg/dL (ref 8–23)
CO2: 23 mmol/L (ref 22–32)
Calcium: 8.5 mg/dL — ABNORMAL LOW (ref 8.9–10.3)
Chloride: 103 mmol/L (ref 98–111)
Creatinine, Ser: 0.61 mg/dL (ref 0.61–1.24)
GFR calc Af Amer: >60 mL/min (ref >60)
GFR calc non Af Amer: >60 mL/min (ref >60)
Glucose, Bld: 118 mg/dL — ABNORMAL HIGH (ref 70–99)
Potassium: 3.2 mmol/L — ABNORMAL LOW (ref 3.5–5.1)
Sodium: 135 mmol/L (ref 135–145)

## 2019-03-20 LAB — MAGNESIUM: Magnesium: 1.9 mg/dL (ref 1.7–2.4)

## 2019-03-20 LAB — PHOSPHORUS: Phosphorus: 3.2 mg/dL (ref 2.5–4.6)

## 2019-03-20 MED ORDER — SODIUM CHLORIDE 0.9 % IV SOLN
1.0000 g | INTRAVENOUS | Status: DC
  Administered 2019-03-20: 1 g via INTRAVENOUS
  Filled 2019-03-20: qty 10
  Filled 2019-03-20: qty 1

## 2019-03-20 MED ORDER — LABETALOL HCL 5 MG/ML IV SOLN
10.0000 mg | Freq: Four times a day (QID) | INTRAVENOUS | Status: DC | PRN
  Administered 2019-03-20 – 2019-03-21 (×3): 20 mg via INTRAVENOUS
  Filled 2019-03-20 (×3): qty 4

## 2019-03-20 MED ORDER — POTASSIUM CHLORIDE 10 MEQ/50ML IV SOLN
10.0000 meq | INTRAVENOUS | Status: AC
  Administered 2019-03-20 (×3): 10 meq via INTRAVENOUS
  Filled 2019-03-20 (×3): qty 50

## 2019-03-20 NOTE — Procedures (Signed)
Extubation Procedure Note  Patient Details:   Name: Ronald [name] DOB: 1956-02-09 MRN: [id number]   Airway Documentation:    Vent end date: 03/20/19 Vent end time: 1035   Evaluation  O2 sats: stable throughout Complications: No apparent complications Patient did tolerate procedure well. Bilateral Breath Sounds: Diminished, Rhonchi   Yes.  Pt extubated per physician order. Pt suctioned via ETT and orally prior. Pt able to speak name, fair cough and no stridor was heard. Pt placed on 4L nasal cannula and IS therapy was started.   Sharla Kidney 03/20/2019, 10:46 AM

## 2019-03-20 NOTE — Progress Notes (Signed)
Called and updated patient's family.  

## 2019-03-20 NOTE — H&P (Signed)
NAME:  Ronald [name], MRN:  [id number], DOB:  05/25/1956, LOS: 2 ADMISSION DATE:  03/17/2019, CONSULTATION DATE:  03/18/2019 REFERRING MD:  ED, CHIEF COMPLAINT:  Acute encephalopathy  Brief History   63 year old man with history of cocaine, tobacco and alcohol use, gout, hypertension, critical limb ischemia s/p left BKA, remote subdural hematoma presenting with acute encephalopathy, left-sided weakness.   History of present illness   63 year old man with history of cocaine, tobacco and alcohol use, gout, hypertension, critical limb ischemia s/p left BKA, remote subdural hematoma presenting with acute encephalopathy, left-sided weakness.  He was checked on 03/16/2019 and was noted to be encephalopathic at that time.  He was checked and on again the following day and he was found down in urine and less responsive.  He was evaluated by EMS and code stroke called. HPI obtained from EMR, ED due to patient's encephalopathy  Past Medical History  Cocaine, tobacco and alcohol use, gout, hypertension, critical limb ischemia s/p left BKA, remote subdural hematoma  Significant Hospital Events   7/5> admit  Consults:  Neuro 7/5>  Procedures:  7/5 EEG >> No seizure activity  Significant Diagnostic Tests:  CXR 7/5 > No focal airspace disease or effusion. Normal heart size. No pneumothorax.  MR brain w/o 7/5 > No acute intracranial abnormality  CTA head/neck 7/5> Negative CT brain perfusion. Patent carotid and vertebral arteries. No dissection, aneurysm, or hemodynamically significant stenosis. Patent anterior and posterior intracranial circulation. No large vessel occlusion, aneurysm, or significant stenosis. 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   CT head w/o 7/5>no acute cortically based infarct or acute intracranial hemorrhage identified. Chronic small vessel disease  with progression in the cerebral white matter since 2015.   Echo 7/6 > EF 50-55%, LVH w/ hypokinesis and low systolic function. L lateral pleural effusion,  Micro Data:  SARS CoV 2 & MRSA 7/5 - Negative BCx 7/5> Resp Cx 7/5>  Antimicrobials:  Ceftriaxone 7/5>   Interim history/subjective:  Pt more active this morning, though mildly sedated s/p ativan for fighting vent. Currently weaning ventilator. Neo-Synephrine tapered off successfully. A-line appears dampened, as reading much lower than cuff.  Objective   Blood pressure (!) 153/87, pulse 89, temperature 98.1 F (36.7 C), temperature source Axillary, resp. rate (!) 24, height 5\' 4"  (1.626 m), weight 71.2 kg, SpO2 98 %.    Vent Mode: CPAP;PSV FiO2 (%):  [30 %] 30 % Set Rate:  [16 bmp] 16 bmp Vt Set:  [470 mL] 470 mL PEEP:  [5 cmH20] 5 cmH20 Pressure Support:  [10 cmH20] 10 cmH20 Plateau Pressure:  [11 cmH20-14 cmH20] 14 cmH20   Intake/Output Summary (Last 24 hours) at 03/20/2019 0918 Last data filed at 03/20/2019 0900 Gross per 24 hour  Intake 1676.56 ml  Output 975 ml  Net 701.56 ml   Filed Weights   03/18/19 0248 03/19/19 0500  Weight: 67.7 kg 71.2 kg    Examination: General: Ill-appearing male, sedated, and mechanically ventilated. HENT: Pupils equal. Sluggishly reactive to light. Injected conjunctiva. ETT in place. Lungs: CTA anteriorly. Normal WOB on wean-mode vent. Intermittently apneic. Cardiovascular: RRR. No m/r/g Abdomen: soft, nondistended. +BS Extremities: L BKA. Warm. No peripheral edema. Neuro: Somnolent, but following commands. Moving all 3 assessable extremities.    Assessment & Plan:   Neurological Acute Encephalopathy: Possibly related to hypertensive emergency, hyponatremia, ?alcohol withdrawal-related seizure. BP initially 205/183 in ED. Trop neg. EtOH neg.  UDS positive for cocaine. CK normal. EKG with new T wave changes and possible ST depressions. Concern for seizure-like activity. - Neuro  following, recs appreciated - EEG 7/5 without seizure activity - Continue phenytoin 100mg  TID, keppra 100mg  TID - Ativan PRN seizure - Seizure precautions - Stop Precedex - IV Thiamine, Folate - Hyponatremia management as below  Cardiovascular Hypotension: Improved - Neo-Synephrine tapered off - Echo 7/6 w/ EF 50-55%, LVH, LV hypokinesis, and low systolic function - CTM  Respiratory Acute Hypercarbic respiratory Failure: - 7/5 pH 7.308, pCO2 50.1, pO2 329 - Vent settings reviewed and adjusted - Continue to wean once pt more awake  Infectious Fever  Leukocytosis: Febrile to 102 on admission with leukocytosis to 13.3. COVID negative.  - WBC 8.2<8.0<13.3 - CXR showing bibasilar infiltrates - Blood Cx - NG24hrs - f/u Respiratory cultures - Ceftriaxone (7/5- )  Electrolytes Hyponatremia: Na+ at 120 on admission.  - Na+ 120>135 over past 48hrs - Continue autocorrection  Hypokalemia: - replete PRN    Best practice:  Diet: NPO Pain/Anxiety/Delirium protocol (if indicated): fent prn, propofol gtt VAP protocol (if indicated): per protocol DVT prophylaxis: SubQ Lovenox GI prophylaxis: PPI Glucose control: monitor Mobility: PT when able Code Status: FULL Family Communication: Sister 7/5 Disposition: ICU  Critical care time: 35 minutes     -- Armida Sans, [facility] 03/20/2019

## 2019-03-20 NOTE — Progress Notes (Signed)
No acute events overnight. Tried to titrate neo off, but patient became hypotensive with each attempt. Intermittently follows commands. MAE.

## 2019-03-20 NOTE — Progress Notes (Signed)
NAME:  Ronald [name], MRN:  [id number], DOB:  1955/09/26, LOS: 2 ADMISSION DATE:  03/17/2019, CONSULTATION DATE:  03/18/2019 REFERRING MD:  ED, CHIEF COMPLAINT:  Acute encephalopathy  Brief History   63 year old man with history of cocaine, tobacco and alcohol use, gout, hypertension, critical limb ischemia s/p left BKA, remote subdural hematoma presenting with acute encephalopathy, left-sided weakness. LTM EEG neg for seizure,   Past Medical History  cocaine, tobacco and alcohol use, gout, hypertension, critical limb ischemia s/p left BKA, remote subdural hematoma  Significant Hospital Events   7/5> admit  Consults:  Neuro 7/5>  Procedures:  EEG 7/5 >> no seizure activity   Significant Diagnostic Tests:   MR brain w/o 7/5 > No acute intracranial abnormality  CTA head/neck 7/5> Negative CT brain perfusion. Patent carotid and vertebral arteries. No dissection, aneurysm, or hemodynamically significant stenosis. Patent anterior and posterior intracranial circulation. No large vessel occlusion, aneurysm, or significant stenosis. 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   CT head w/o 7/5>no acute cortically based infarct or acute intracranial hemorrhage identified. Chronic small vessel disease with progression in the cerebral white matter since 2015.   Echo 7/7 >>nml LVSF, double hypokinesis, no WMA  Micro Data:  BCx 7/5>ng Resp Cx 7/5> strep pneumonia  Antimicrobials:  Ceftriaxone 7/5>>  Interim history/subjective:   Neo-Synephrine tapered to off Calmer on Precedex drip Blood pressure improved Required 1 dose of Ativan this morning Afebrile    Objective   Blood pressure (!) 153/87, pulse 89, temperature 98 F (36.7 C), temperature source Axillary, resp. rate (!) 24, height 5\' 4"  (1.626 m), weight 71.2 kg, SpO2 98 %.    Vent Mode: CPAP;PSV  FiO2 (%):  [30 %] 30 % Set Rate:  [16 bmp] 16 bmp Vt Set:  [470 mL] 470 mL PEEP:  [5 cmH20] 5 cmH20 Pressure Support:  [10 cmH20] 10 cmH20 Plateau Pressure:  [11 cmH20-14 cmH20] 14 cmH20   Intake/Output Summary (Last 24 hours) at 03/20/2019 1032 Last data filed at 03/20/2019 0900 Gross per 24 hour  Intake 1522.17 ml  Output 975 ml  Net 547.17 ml   Filed Weights   03/18/19 0248 03/19/19 0500  Weight: 67.7 kg 71.2 kg    Examination: General: Intubated, sedated, chronically ill-appearing, acute distress HENT: ETT in place, poor dentition, mild pallor, no icterus Lungs: Decreased breath sounds bilateral, moderate thin secretions Cardiovascular: RRR Abdomen: soft, nondistended Extremities: L BKA Neuro: Follows one-step commands on low-dose Precedex, RA SS -1, clean nonfocal   Chest x-ray 7/7 personally reviewed which shows streaky infiltrates better defined at the right base   Assessment & Plan:  63 year old man with history of cocaine, tobacco and alcohol use, gout, hypertension, critical limb ischemia s/p left BKA, remote subdural hematoma presenting with acute encephalopathy, left-sided weakness. He received 1mg  Ativan, Keppra 1000mg  in ED.  Acute encephalopathy, hypertensive emergency, hyponatremia,likelyalcohol withdrawal related seizure: BP initially 205/183 in ED. Trop neg. EtOH neg. UDS + for cocaine --Neuro following --ativan prn seizure, seizure precautions -Precedex if needed, goal RA SS 0 to -1 --IV thiamine, folate --Continue keppra and Dilantin?  Long-term  Acute hypercarbic respiratory failure -secretions limiting, will proceed with trial of extubation since he tolerates 5/5  EKG with new T wave changes and possible ST depressions. Hypotension -related initially to propofol,  troponins negative  Neo-Synephrine  off  Hyponatremia - resolved HypoKalemia will be repleted  Pneumococcal pneumonia  vs aspiration --Empiric ceftriaxone x 5-7 ds    Best practice:   Diet: NPO Pain/Anxiety/Delirium protocol (if indicated): precedex VAP protocol (if indicated): ordered DVT prophylaxis: SubQ Lovenox GI prophylaxis: PPI Glucose control: monitor Mobility: PT when able Code Status: FULL Family Communication:sister 7/6 Disposition: ICU  The patient is critically ill with multiple organ systems failure and requires high complexity decision making for assessment and support, frequent evaluation and titration of therapies, application of advanced monitoring technologies and extensive interpretation of multiple databases. Critical Care Time devoted to patient care services described in this note independent of APP/resident  time is 32 minutes.     Cyril Mourningakesh Alva MD. Tonny BollmanFCCP. [facility] Pager [phone] If no response call [phone]   03/20/2019

## 2019-03-20 NOTE — Progress Notes (Signed)
Patient is much improved today.  He follows commands readily.  I do suspect that this was a seizure precipitated by cocaine however given that it was a focal seizure, I would advise driving precautions and not driving for a period of 6 months.  I do not think we need to continue antiepileptics given that this was likely provoked and this is his first seizure of any kind and I will discontinue that at this time.  If he were to have further spells of concern, then I would restart this and likely will need continued therapy, but do not feel this is necessary at this time.  At this time, neurology will sign off, please call with further questions or concerns.  Roland Rack, MD [facility] [phone]  If 7pm- 7am, please page neurology on call as listed in [place].

## 2019-03-21 DIAGNOSIS — G92 Toxic encephalopathy: Secondary | ICD-10-CM

## 2019-03-21 DIAGNOSIS — E876 Hypokalemia: Secondary | ICD-10-CM

## 2019-03-21 LAB — BASIC METABOLIC PANEL
Anion gap: 12 (ref 5–15)
Anion gap: 11 (ref 5–15)
BUN: <5 mg/dL — ABNORMAL LOW (ref 8–23)
BUN: <5 mg/dL — ABNORMAL LOW (ref 8–23)
CO2: 25 mmol/L (ref 22–32)
CO2: 24 mmol/L (ref 22–32)
Calcium: 8.7 mg/dL — ABNORMAL LOW (ref 8.9–10.3)
Calcium: 9 mg/dL (ref 8.9–10.3)
Chloride: 98 mmol/L (ref 98–111)
Chloride: 101 mmol/L (ref 98–111)
Creatinine, Ser: 0.58 mg/dL — ABNORMAL LOW (ref 0.61–1.24)
Creatinine, Ser: 0.61 mg/dL (ref 0.61–1.24)
GFR calc Af Amer: >60 mL/min (ref >60)
GFR calc Af Amer: >60 mL/min (ref >60)
GFR calc non Af Amer: >60 mL/min (ref >60)
GFR calc non Af Amer: >60 mL/min (ref >60)
Glucose, Bld: 128 mg/dL — ABNORMAL HIGH (ref 70–99)
Glucose, Bld: 111 mg/dL — ABNORMAL HIGH (ref 70–99)
Potassium: 2.6 mmol/L — CL (ref 3.5–5.1)
Potassium: 3.9 mmol/L (ref 3.5–5.1)
Sodium: 135 mmol/L (ref 135–145)
Sodium: 136 mmol/L (ref 135–145)

## 2019-03-21 LAB — GLUCOSE, CAPILLARY
Glucose-Capillary: 101 mg/dL — ABNORMAL HIGH (ref 70–99)
Glucose-Capillary: 101 mg/dL — ABNORMAL HIGH (ref 70–99)
Glucose-Capillary: 105 mg/dL — ABNORMAL HIGH (ref 70–99)
Glucose-Capillary: 122 mg/dL — ABNORMAL HIGH (ref 70–99)
Glucose-Capillary: 99 mg/dL (ref 70–99)

## 2019-03-21 LAB — CULTURE, RESPIRATORY W GRAM STAIN

## 2019-03-21 LAB — CBC
HCT: 33.6 % — ABNORMAL LOW (ref 39.0–52.0)
Hemoglobin: 11.5 g/dL — ABNORMAL LOW (ref 13.0–17.0)
MCH: 33.4 pg (ref 26.0–34.0)
MCHC: 34.2 g/dL (ref 30.0–36.0)
MCV: 97.7 fL (ref 80.0–100.0)
Platelets: 284 10*3/uL (ref 150–400)
RBC: 3.44 MIL/uL — ABNORMAL LOW (ref 4.22–5.81)
RDW: 13.3 % (ref 11.5–15.5)
WBC: 7.1 10*3/uL (ref 4.0–10.5)
nRBC: 0 % (ref 0.0–0.2)

## 2019-03-21 LAB — PHOSPHORUS: Phosphorus: 2.7 mg/dL (ref 2.5–4.6)

## 2019-03-21 MED ORDER — LORAZEPAM 2 MG/ML IJ SOLN
0.0000 mg | Freq: Four times a day (QID) | INTRAMUSCULAR | Status: AC
  Administered 2019-03-21 (×2): 2 mg via INTRAVENOUS
  Administered 2019-03-22 (×2): 1 mg via INTRAVENOUS
  Filled 2019-03-21 (×4): qty 1

## 2019-03-21 MED ORDER — LORAZEPAM 2 MG/ML IJ SOLN
0.0000 mg | Freq: Two times a day (BID) | INTRAMUSCULAR | Status: AC
  Administered 2019-03-23: 2 mg via INTRAVENOUS
  Filled 2019-03-21: qty 1

## 2019-03-21 MED ORDER — PRO-STAT SUGAR FREE PO LIQD: 30.0000 mL | Freq: Two times a day (BID) | ORAL | Status: DC

## 2019-03-21 MED ORDER — ADULT MULTIVITAMIN W/MINERALS CH
1.0000 | ORAL_TABLET | Freq: Every day | ORAL | Status: DC
  Administered 2019-03-21 – 2019-03-26 (×5): 1 via ORAL
  Filled 2019-03-21 (×6): qty 1

## 2019-03-21 MED ORDER — POTASSIUM CHLORIDE 10 MEQ/50ML IV SOLN
10.0000 meq | INTRAVENOUS | Status: AC
  Administered 2019-03-21 (×10): 10 meq via INTRAVENOUS
  Filled 2019-03-21 (×10): qty 50

## 2019-03-21 MED ORDER — POTASSIUM CHLORIDE 20 MEQ/15ML (10%) PO SOLN: 40.0000 meq | ORAL | Status: DC

## 2019-03-21 MED ORDER — LORAZEPAM 2 MG/ML IJ SOLN: 1.0000 mg | Freq: Four times a day (QID) | INTRAMUSCULAR | Status: AC | PRN

## 2019-03-21 MED ORDER — LORAZEPAM 1 MG PO TABS: 1.0000 mg | ORAL_TABLET | Freq: Four times a day (QID) | ORAL | Status: AC | PRN

## 2019-03-21 MED ORDER — ENSURE ENLIVE PO LIQD
237.0000 mL | Freq: Three times a day (TID) | ORAL | Status: DC
  Administered 2019-03-22 – 2019-03-26 (×12): 237 mL via ORAL

## 2019-03-21 MED ORDER — LORAZEPAM 2 MG/ML IJ SOLN: 1.0000 mg | INTRAMUSCULAR | Status: DC | PRN

## 2019-03-21 NOTE — H&P (Signed)
NAME:  Ronald [name], MRN:  [id number], DOB:  1955-11-24, LOS: 3 ADMISSION DATE:  03/17/2019, CONSULTATION DATE:  03/18/2019 REFERRING MD:  ED, CHIEF COMPLAINT:  Acute encephalopathy  Brief History   63 year old man with history of cocaine, tobacco and alcohol use, gout, hypertension, critical limb ischemia s/p left BKA, remote subdural hematoma presenting with acute encephalopathy, left-sided weakness, and seizure-like activity. LTM EEG negative for seizure.   History of present illness   63 year old man with history of cocaine, tobacco and alcohol use, gout, hypertension, critical limb ischemia s/p left BKA, remote subdural hematoma presenting with acute encephalopathy, left-sided weakness.  He was checked on 03/16/2019 and was noted to be encephalopathic at that time.  He was checked and on again the following day and he was found down in urine and less responsive.  He was evaluated by EMS and code stroke called. HPI obtained from EMR, ED due to patient's encephalopathy  Past Medical History  Cocaine, tobacco and alcohol use, gout, hypertension, critical limb ischemia s/p left BKA, remote subdural hematoma  Significant Hospital Events   7/5> admit  Consults:  Neuro 7/5>  Procedures:  7/5 EEG >> No seizure activity  Significant Diagnostic Tests:  CXR 7/5 > No focal airspace disease or effusion. Normal heart size. No pneumothorax.  MR brain w/o 7/5 > No acute intracranial abnormality  CTA head/neck 7/5> Negative CT brain perfusion. Patent carotid and vertebral arteries. No dissection, aneurysm, or hemodynamically significant stenosis. Patent anterior and posterior intracranial circulation. No large vessel occlusion, aneurysm, or significant stenosis. 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   CT head w/o 7/5>no acute cortically based infarct or acute  intracranial hemorrhage identified. Chronic small vessel disease with progression in the cerebral white matter since 2015.   Echo 7/6 > EF 50-55%, LVH w/ hypokinesis and low systolic function. L lateral pleural effusion  CXR 7/6 > R medial and L basilar atelectasis/infiltrates  Micro Data:  SARS CoV 2 & MRSA 7/5 - Negative BCx 7/5> (Pending) NGTD Resp Cx 7/5> (Pending) Growing few strep pneumo  Antimicrobials:  Ceftriaxone 7/5>   Interim history/subjective:  Received IV labetalol for SBP>105 overnight. Patient feeling well this morning. Fully awake with normal WOB on room air. Denies chest pain or SOB. Stable off sedation, pressors, and AEDs.   Objective   Blood pressure (!) 156/93, pulse 100, temperature 98.3 F (36.8 C), temperature source Oral, resp. rate (!) 21, height 5\' 4"  (1.626 m), weight 71.2 kg, SpO2 99 %.        Intake/Output Summary (Last 24 hours) at 03/21/2019 0832 Last data filed at 03/21/2019 0519 Gross per 24 hour  Intake 780.29 ml  Output 3000 ml  Net -2219.71 ml   Filed Weights   03/18/19 0248 03/19/19 0500  Weight: 67.7 kg 71.2 kg    Examination: General: NAD. Lying comfortably in bed, watching TV HENT: Pupils equal. MMM. Poor dentition. Lungs: CTA anteriorly. Normal WOB on RA Cardiovascular: RRR. No m/r/g Abdomen: soft, nondistended. +BS Extremities: L BKA. Warm. No peripheral edema. Neuro: Alert and interactive. Following commands and moving all extremities.   Assessment & Plan:   Neurological Acute Encephalopathy: Possibly related to hypertensive emergency, hyponatremia, ?alcohol withdrawal or cocaine-related seizure. Per Neuro, since provoked, first-time seizure, no need for long- term AEDs. - EEG 7/5 without seizure activity - d/c phenytoin and keppra - Neuro has signed off - Thiamine, Folate supplementation  Cardiovascular Hypertension: Hxo HTN with likely medication noncompliance and cocaine use - Labetalol PRN - Consider restarting home  Maxzide  Respiratory Acute Hypercarbic respiratory Failure, resolved - Successfully extubated, not stable on room air  Infectious Pneumococcal vs Aspiration Pneumonia: Febrile to 102 on admission with leukocytosis to 13.3. COVID negative.  - CXR showing bibasilar infiltrates - Respiratory cultures growing strep pneumo - Blood Cx - NGTD - Ceftriaxone (7/5- ) for 5-7 days  Electrolytes Hyponatremia, resolved  Hypokalemia: - replete PRN    Best practice:  Diet: NPO Pain/Anxiety/Delirium protocol (if indicated): fent prn, propofol gtt VAP protocol (if indicated): per protocol DVT prophylaxis: SubQ Lovenox GI prophylaxis: PPI Glucose control: monitor Mobility: PT when able Code Status: FULL Family Communication: Sister 7/6 Disposition: Medically stable for transfer to floor  Critical care time: 35 minutes     -- Orson Apeiffany Alinna Siple, [facility] 03/21/2019

## 2019-03-21 NOTE — Evaluation (Signed)
Physical Therapy Evaluation Patient Details Name: Ronald [name] MRN: [id number] DOB: 02-08-1956 Today's Date: 03/21/2019   History of Present Illness  Pt is a 63 y.o. M with significant PMH of cocaine, tobacco and alcohol use, gout, hypertension, critical limb iscehmia s/p left BKA, remote subdural hematoma who presents with acute encephalopathy, left sided weakness, and seizure like activity. LTM EEG negative for seizures. MRI 7/5 negative for acute abnormality. CXR 7/6 showing R medial and L basilar atelectasis.  Clinical Impression  Pt admitted with above diagnosis. Pt currently with functional limitations due to the deficits listed below (see PT Problem List). Prior to admission, pt lives alone, modified independent at a wheelchair level, requires assist for IADL's. Presents with decreased functional mobility secondary to decreased cognition, weakness, balance impairments. Requiring two person maximal assist for all aspects of bed mobility, limited by drowsiness due to recent Ativan usage. Pt will benefit from skilled PT to increase their independence and safety with mobility to allow discharge to the venue listed below.       Follow Up Recommendations SNF;Supervision/Assistance - 24 hour    Equipment Recommendations  None recommended by PT    Recommendations for Other Services       Precautions / Restrictions Precautions Precautions: Fall;Other (comment) Precaution Comments: left BKA Restrictions Weight Bearing Restrictions: No      Mobility  Bed Mobility Overal bed mobility: Needs Assistance Bed Mobility: Supine to Sit;Sit to Supine;Rolling Rolling: Max assist   Supine to sit: Max assist;+2 for physical assistance Sit to supine: Min assist;+2 for safety/equipment   General bed mobility comments: Rolling to right and left with maxA for bed pad repositioning. Pt requiring maxA + 2 for initiation to sit upright towards left side in bed. Assist for bringing lower extremities  off of bed and trunk elevation with HOB flat. Pt initiating return to supine, minA for RLE negotiation back into bed. totalA for repositioning with use of bed pad to pull pt up in bed  Transfers                    Ambulation/Gait                Stairs            Wheelchair Mobility    Modified Rankin (Stroke Patients Only) Modified Rankin (Stroke Patients Only) Pre-Morbid Rankin Score: Moderate disability Modified Rankin: Severe disability     Balance Overall balance assessment: Needs assistance Sitting-balance support: Bilateral upper extremity supported(R foot supported) Sitting balance-Leahy Scale: Poor Sitting balance - Comments: Reliant on UE support, posterior LOB with challenge                                     Pertinent Vitals/Pain Pain Assessment: No/denies pain    Home Living Family/patient expects to be discharged to:: Private residence Living Arrangements: Alone Available Help at Discharge: Friend(s);Available PRN/intermittently Type of Home: House Home Access: Ramped entrance       Home Equipment: Wheelchair - manual      Prior Function Level of Independence: Needs assistance   Gait / Transfers Assistance Needed: modI with wheelchair  ADL's / Homemaking Assistance Needed: Pt states he is independent with ADL's, reports "Nae Nae," cooks for him and his brother drives him places  Comments: Pt is a questionable historian     Hand Dominance        Extremity/Trunk Assessment  Upper Extremity Assessment Upper Extremity Assessment: Defer to OT evaluation    Lower Extremity Assessment Lower Extremity Assessment: LLE deficits/detail;RLE deficits/detail RLE Deficits / Details: Grossly weak LLE Deficits / Details: L BKA, able to reach neutral knee extension       Communication   Communication: No difficulties  Cognition Arousal/Alertness: Suspect due to medications Behavior During Therapy: WFL for tasks  assessed/performed Overall Cognitive Status: Impaired/Different from baseline Area of Impairment: Orientation;Attention;Memory;Following commands;Safety/judgement;Awareness;Problem solving                 Orientation Level: Disoriented to;Place;Situation;Time Current Attention Level: Focused Memory: Decreased short-term memory Following Commands: Follows one step commands inconsistently Safety/Judgement: Decreased awareness of deficits;Decreased awareness of safety Awareness: Intellectual Problem Solving: Decreased initiation;Requires verbal cues;Requires tactile cues General Comments: Pt cognitive assessment likely impacted by recent Ativan usage; pt drowsy throughout session with difficulty staying awake and maintaining adequate arousal/attention. Pt following simple commands ~50-75% of the time, thinks he is at home despite redirection, asking for money to purchase a beer.       General Comments      Exercises     Assessment/Plan    PT Assessment Patient needs continued PT services  PT Problem List Decreased strength;Decreased range of motion;Decreased activity tolerance;Decreased balance;Decreased mobility;Decreased cognition;Decreased safety awareness       PT Treatment Interventions Functional mobility training;Therapeutic exercise;Therapeutic activities;Balance training;Patient/family education;Wheelchair mobility training    PT Goals (Current goals can be found in the Care Plan section)  Acute Rehab PT Goals Patient Stated Goal: "have a beer." PT Goal Formulation: With patient Time For Goal Achievement: 04/04/19 Potential to Achieve Goals: Fair    Frequency Min 2X/week   Barriers to discharge Decreased caregiver support      Co-evaluation PT/OT/SLP Co-Evaluation/Treatment: Yes Reason for Co-Treatment: Complexity of the patient's impairments (multi-system involvement);Necessary to address cognition/behavior during functional activity;For patient/therapist  safety;To address functional/ADL transfers PT goals addressed during session: Mobility/safety with mobility         AM-PAC PT "6 Clicks" Mobility  Outcome Measure Help needed turning from your back to your side while in a flat bed without using bedrails?: Total Help needed moving from lying on your back to sitting on the side of a flat bed without using bedrails?: Total Help needed moving to and from a bed to a chair (including a wheelchair)?: Total Help needed standing up from a chair using your arms (e.g., wheelchair or bedside chair)?: Total Help needed to walk in hospital room?: Total Help needed climbing 3-5 steps with a railing? : Total 6 Click Score: 6    End of Session   Activity Tolerance: Patient limited by fatigue Patient left: in bed;with call bell/phone within reach;with bed alarm set;Other (comment)(with bed in chair position) Nurse Communication: Mobility status PT Visit Diagnosis: Other abnormalities of gait and mobility (R26.89);Muscle weakness (generalized) (M62.81)    Time: 1275-1700 PT Time Calculation (min) (ACUTE ONLY): 17 min   Charges:   PT Evaluation $PT Eval Moderate Complexity: 1 Mod          Ellamae Sia, [place], DPT Acute Rehabilitation Services Pager [phone] Office [phone]   Willy Eddy 03/21/2019, 1:01 PM

## 2019-03-21 NOTE — Progress Notes (Signed)
No acute events overnight. VSS. Patient restless and hoping to leave ICU today.

## 2019-03-21 NOTE — Progress Notes (Signed)
[facility] ADULT ICU REPLACEMENT PROTOCOL FOR AM LAB REPLACEMENT ONLY  The patient does apply for the [facility] Adult ICU Electrolyte Replacment Protocol based on the criteria listed below:   1. Is GFR >/= 40 ml/min? Yes.    Patient's GFR today is >60 2. Is urine output >/= 0.5 ml/kg/hr for the last 6 hours? Yes.   Patient's UOP is 3.5 ml/kg/hr 3. Is BUN < 60 mg/dL? Yes.    Patient's BUN today is <5 4. Abnormal electrolyte  K 2.6 5. Ordered repletion with: per protocol 6. If a panic level lab has been reported, has the CCM MD in charge been notified? Yes.  .   Physician:  Illene Labrador, [place]ary Brim 03/21/2019 6:10 AM

## 2019-03-21 NOTE — Plan of Care (Signed)
Pt increasingly agitated throughout day, confused as to location, tremors in upper extremities while at rest. Pt occasionally yelling out of room asking staff to bring him an alcoholic drink. This RN explained that pt will not receive alcohol, but will give medication to help w/ symptoms. Gave ativan per CIWA protocol.

## 2019-03-21 NOTE — Evaluation (Signed)
Occupational Therapy Evaluation Patient Details Name: Ronald [name] MRN: [id number] DOB: 1956-04-23 Today's Date: 03/21/2019    History of Present Illness Pt is a 63 y.o. M with significant PMH of cocaine, tobacco and alcohol use, gout, hypertension, critical limb iscehmia s/p left BKA, remote subdural hematoma who presents with acute encephalopathy, left sided weakness, and seizure like activity. LTM EEG negative for seizures. MRI 7/5 negative for acute abnormality. CXR 7/6 showing R medial and L basilar atelectasis.   Clinical Impression   PT admitted with see above. Pt currently with functional limitiations due to the deficits listed below (see OT problem list). Pt currently bed level eval only due to cognitive deficits and balance deficits. Pt will benefit from skilled OT to increase their independence and safety with adls and balance to allow discharge SNF.     Follow Up Recommendations  SNF    Equipment Recommendations  Wheelchair cushion (measurements OT);Wheelchair (measurements OT);Other (comment)(cushion )    Recommendations for Other Services       Precautions / Restrictions Precautions Precautions: Fall;Other (comment) Precaution Comments: left BKA Restrictions Weight Bearing Restrictions: No      Mobility Bed Mobility Overal bed mobility: Needs Assistance Bed Mobility: Supine to Sit;Sit to Supine;Rolling Rolling: Max assist   Supine to sit: Max assist;+2 for physical assistance Sit to supine: Min assist;+2 for safety/equipment   General bed mobility comments: Rolling to right and left with maxA for bed pad repositioning. Pt requiring maxA + 2 for initiation to sit upright towards left side in bed. Assist for bringing lower extremities off of bed and trunk elevation with HOB flat. Pt initiating return to supine, minA for RLE negotiation back into bed. totalA for repositioning with use of bed pad to pull pt up in bed  Transfers                 General  transfer comment: not appropriate at this time due to increase agitation sitting and bil LE tremors    Balance Overall balance assessment: Needs assistance Sitting-balance support: Bilateral upper extremity supported(R foot supported) Sitting balance-Leahy Scale: Poor Sitting balance - Comments: Reliant on UE support, posterior LOB with challenge                                   ADL either performed or assessed with clinical judgement   ADL Overall ADL's : Needs assistance/impaired                                       General ADL Comments: pt requires max (A) for all adls at this time      Vision         Perception     Praxis      Pertinent Vitals/Pain Pain Assessment: No/denies pain     Hand Dominance Right   Extremity/Trunk Assessment Upper Extremity Assessment Upper Extremity Assessment: Generalized weakness(tremor noted)   Lower Extremity Assessment Lower Extremity Assessment: Defer to PT evaluation RLE Deficits / Details: Grossly weak LLE Deficits / Details: L BKA, able to reach neutral knee extension   Cervical / Trunk Assessment Cervical / Trunk Assessment: Kyphotic   Communication Communication Communication: No difficulties   Cognition Arousal/Alertness: Suspect due to medications Behavior During Therapy: WFL for tasks assessed/performed Overall Cognitive Status: Impaired/Different from baseline Area of Impairment: Orientation;Attention;Memory;Following commands;Safety/judgement;Awareness;Problem solving  Orientation Level: Disoriented to;Place;Situation;Time Current Attention Level: Focused Memory: Decreased short-term memory Following Commands: Follows one step commands inconsistently Safety/Judgement: Decreased awareness of deficits;Decreased awareness of safety Awareness: Intellectual Problem Solving: Decreased initiation;Requires verbal cues;Requires tactile cues General Comments: Pt  cognitive assessment likely impacted by recent Ativan usage; pt drowsy throughout session with difficulty staying awake and maintaining adequate arousal/attention. Pt following simple commands ~50-75% of the time, thinks he is at home despite redirection, asking for money to purchase a beer.    General Comments       Exercises     Shoulder Instructions      Home Living Family/patient expects to be discharged to:: Private residence Living Arrangements: Alone Available Help at Discharge: Friend(s);Available PRN/intermittently Type of Home: House Home Access: Ramped entrance                     Home Equipment: Wheelchair - manual   Additional Comments: information obtained from previous chart      Prior Functioning/Environment Level of Independence: Needs assistance  Gait / Transfers Assistance Needed: modI with wheelchair ADL's / Homemaking Assistance Needed: Pt states he is independent with ADL's, reports "Nae Nae," cooks for him and his brother drives him places   Comments: Pt is a questionable historian        OT Problem List: Decreased strength;Decreased activity tolerance;Impaired balance (sitting and/or standing);Decreased cognition;Decreased coordination;Obesity      OT Treatment/Interventions: Self-care/ADL training;Therapeutic exercise;Neuromuscular education;DME and/or AE instruction;Therapeutic activities;Cognitive remediation/compensation;Balance training;Patient/family education    OT Goals(Current goals can be found in the care plan section) Acute Rehab OT Goals Patient Stated Goal: "have a beer." OT Goal Formulation: Patient unable to participate in goal setting Time For Goal Achievement: 04/04/19 Potential to Achieve Goals: Fair  OT Frequency: Min 2X/week   Barriers to D/C:    uncertain       Co-evaluation PT/OT/SLP Co-Evaluation/Treatment: Yes Reason for Co-Treatment: Complexity of the patient's impairments (multi-system involvement);Necessary  to address cognition/behavior during functional activity;For patient/therapist safety;To address functional/ADL transfers   OT goals addressed during session: ADL's and self-care      AM-PAC OT "6 Clicks" Daily Activity     Outcome Measure Help from another person eating meals?: A Lot Help from another person taking care of personal grooming?: A Lot Help from another person toileting, which includes using toliet, bedpan, or urinal?: A Lot Help from another person bathing (including washing, rinsing, drying)?: A Lot Help from another person to put on and taking off regular upper body clothing?: A Lot Help from another person to put on and taking off regular lower body clothing?: Total 6 Click Score: 11   End of Session Nurse Communication: Mobility status;Precautions  Activity Tolerance: Patient tolerated treatment well Patient left: in bed;with call bell/phone within reach;with bed alarm set  OT Visit Diagnosis: Muscle weakness (generalized) (M62.81)                Time: 0865-78461208-1225 OT Time Calculation (min): 17 min Charges:  OT General Charges $OT Visit: 1 Visit OT Evaluation $OT Eval Moderate Complexity: 1 Mod   Mateo FlowBrynn Deone Omahoney, OTR/L  Acute Rehabilitation Services Pager: [phone] Office: [phone] .   Mateo FlowBrynn Gladyce Mcray 03/21/2019, 5:28 PM

## 2019-03-21 NOTE — Progress Notes (Signed)
Nutrition Follow-up  RD working remotely.  DOCUMENTATION CODES:   Not applicable  INTERVENTION:   -Ensure Enlive po TID, each supplement provides 350 kcal and 20 grams of protein -MVI with minerals daily -30 ml Prostat BID, each supplement provides 100 kcals and 15 grams protein  NUTRITION DIAGNOSIS:   Moderate Malnutrition related to chronic illness(drug/ETOH abuse) as evidenced by moderate fat depletion, moderate muscle depletion.  Ongoing  GOAL:   Patient will meet greater than or equal to 90% of their needs  Progressing   MONITOR:   PO intake, Supplement acceptance, Diet advancement, Weight trends, Skin, I & O's  REASON FOR ASSESSMENT:   Consult, Ventilator Enteral/tube feeding initiation and management  ASSESSMENT:   Pt with PMH of cocaine, tobacco, and ETOH abuse, HTN, critial limb ischemia s/p L BKA, remote SDH now admitted 7/5 with acute encephalopathy and L sided weakness. Possible alcohol withdrawal seizures.  7/6- PICC placed 7/7- extubated, OGT removed, TF d/c  Reviewed I/O's: -2.2 L x 24 hours and +995 ml since admission  UOP: 3 L x 24 hours  Per neurology notes, focal seizures likely secondary to cocaine use.   Pt advanced to a full liquid diet on 03/20/19. No documented meal intake recorded. Given pt's malnutrition and restrictions of full liquid diet, would benefit from addition of oral nutrition supplements.   Per MD notes, hopeful transfer to floor later today.   Medications reviewed and include folvite, dilantin, thiamine.   Labs reviewed: K: 2.6 (on IV supplementation). Phos WDL. CBGS: 92-95.   Diet Order:   Diet Order            Diet full liquid Room service appropriate? Yes with Assist; Fluid consistency: Thin  Diet effective now              EDUCATION NEEDS:   No education needs have been identified at this time  Skin:  Skin Assessment: Reviewed RN Assessment  Last BM:  03/21/19  Height:   Ht Readings from Last 1  Encounters:  03/18/19 5\' 4"  (1.626 m)    Weight:   Wt Readings from Last 1 Encounters:  03/19/19 71.2 kg    Ideal Body Weight:  55 kg  BMI:  Body mass index is 26.94 kg/m.  Estimated Nutritional Needs:   Kcal:  2150-2350  Protein:  110-125 grams  Fluid:  > 2.1 L    Janat Tabbert A. Jimmye Norman, RD, LDN, [place] Registered Dietitian II Certified Diabetes Care and Education Specialist Pager: [phone] After hours Pager: [phone]

## 2019-03-22 LAB — BASIC METABOLIC PANEL
Anion gap: 12 (ref 5–15)
BUN: <5 mg/dL — ABNORMAL LOW (ref 8–23)
CO2: 23 mmol/L (ref 22–32)
Calcium: 8.9 mg/dL (ref 8.9–10.3)
Chloride: 102 mmol/L (ref 98–111)
Creatinine, Ser: 0.61 mg/dL (ref 0.61–1.24)
GFR calc Af Amer: >60 mL/min (ref >60)
GFR calc non Af Amer: >60 mL/min (ref >60)
Glucose, Bld: 94 mg/dL (ref 70–99)
Potassium: 3.7 mmol/L (ref 3.5–5.1)
Sodium: 137 mmol/L (ref 135–145)

## 2019-03-22 LAB — GLUCOSE, CAPILLARY
Glucose-Capillary: 82 mg/dL (ref 70–99)
Glucose-Capillary: 72 mg/dL (ref 70–99)
Glucose-Capillary: 99 mg/dL (ref 70–99)
Glucose-Capillary: 111 mg/dL — ABNORMAL HIGH (ref 70–99)

## 2019-03-22 LAB — PHOSPHORUS: Phosphorus: 3.8 mg/dL (ref 2.5–4.6)

## 2019-03-22 MED ORDER — VITAMIN B-1 100 MG PO TABS
100.0000 mg | ORAL_TABLET | Freq: Every day | ORAL | Status: DC
  Administered 2019-03-23 – 2019-03-26 (×4): 100 mg via ORAL
  Filled 2019-03-22 (×5): qty 1

## 2019-03-22 NOTE — Evaluation (Signed)
Clinical/Bedside Swallow Evaluation Patient Details  Name: Ronald [name] MRN: [id number] Date of Birth: 02/19/1956  Today's Date: 03/22/2019 Time: SLP Start Time (ACUTE ONLY): 1450 SLP Stop Time (ACUTE ONLY): 1510 SLP Time Calculation (min) (ACUTE ONLY): 20 min  Past Medical History:  Past Medical History:  Diagnosis Date  . Critical lower limb ischemia   . ETOH abuse   . Gout   . Hypertension    Past Surgical History:  Past Surgical History:  Procedure Laterality Date  . ABDOMINAL AORTAGRAM N/A 12/26/2013   Procedure: ABDOMINAL Ronald FlurryAORTAGRAM;  Surgeon: Nada LibmanVance W Brabham, MD;  [facility];  Service: Cardiovascular;  Laterality: N/A;  . AMPUTATION Left 12/28/2013   Procedure: LEFT BELOW KNEE AMPUTATION;  Surgeon: [name], MD;  Location: [facility] OR;  Service: Orthopedics;  Laterality: Left;  . BELOW KNEE LEG AMPUTATION     Left  . LEG SURGERY     HPI:  Pt is a 63 y.o. M with significant PMH of cocaine, tobacco and alcohol use, gout, hypertension, critical limb iscehmia s/p left BKA, remote subdural hematoma who presents with acute encephalopathy, left sided weakness, and seizure like activity. LTM EEG negative for seizures. MRI 7/5 negative for acute abnormality. CXR 7/6 showing R medial and L basilar atelectasis.He was intubated on 7/4 and extubated on 7/7.   Assessment / Plan / Recommendation Clinical Impression  Patient presents with a mild-moderate oropharyngeal dysphagia characterized by delayed coughing with thin liquids and delayed but explosive coughing after PO's of regular solids followed by patient wretching but no vomitus. Based on this presentation and patient stating he has been "burping for past 6 months", suspect he has GERD as well. No overt s/s of aspiraiton or penetration with nectar thick liquids or puree solids. Will proceed with MBS next date. SLP Visit Diagnosis: Dysphagia, unspecified (R13.10)    Aspiration Risk  Mild aspiration risk    Diet  Recommendation Dysphagia 1 (Puree);Nectar-thick liquid   Liquid Administration via: Cup;Straw Medication Administration: Whole meds with puree Supervision: Full supervision/cueing for compensatory strategies;Staff to assist with self feeding Compensations: Minimize environmental distractions;Slow rate;Small sips/bites Postural Changes: Seated upright at 90 degrees    Other  Recommendations Oral Care Recommendations: Oral care BID   Follow up Recommendations 24 hour supervision/assistance(TBD)      Frequency and Duration min 2x/week  1 week       Prognosis Prognosis for Safe Diet Advancement: Good      Swallow Study   General Date of Onset: 03/18/19 HPI: Pt is a 63 y.o. M with significant PMH of cocaine, tobacco and alcohol use, gout, hypertension, critical limb iscehmia s/p left BKA, remote subdural hematoma who presents with acute encephalopathy, left sided weakness, and seizure like activity. LTM EEG negative for seizures. MRI 7/5 negative for acute abnormality. CXR 7/6 showing R medial and L basilar atelectasis.He was intubated on 7/4 and extubated on 7/7. Type of Study: Bedside Swallow Evaluation Previous Swallow Assessment: N/a Diet Prior to this Study: NPO Temperature Spikes Noted: No History of Recent Intubation: Yes Length of Intubations (days): 4 days Date extubated: 03/20/19 Behavior/Cognition: Alert;Pleasant mood;Cooperative;Confused Oral Cavity Assessment: Within Functional Limits Oral Care Completed by SLP: Yes Oral Cavity - Dentition: Missing dentition;Poor condition Vision: Impaired for self-feeding Self-Feeding Abilities: Needs assist;Needs set up Patient Positioning: Upright in bed Baseline Vocal Quality: Hoarse Volitional Cough: Weak Volitional Swallow: Unable to elicit    Oral/Motor/Sensory Function Overall Oral Motor/Sensory Function: Within functional limits   Ice Chips  Thin Liquid Thin Liquid: Impaired Presentation: Straw;Cup Pharyngeal  Phase  Impairments: Suspected delayed Swallow;Cough - Delayed    Nectar Thick Nectar Thick Liquid: Within functional limits Presentation: Cup;Straw   Honey Thick Honey Thick Liquid: Not tested   Puree Puree: Within functional limits Presentation: Spoon   Solid     Solid: Impaired Pharyngeal Phase Impairments: Cough - Delayed Other Comments: delayed and explosive cough with patient then starting to wretch      Ronald [name] 03/22/2019,6:01 PM    Sonia Baller, MA, [place] [facility] Pager: [phone]

## 2019-03-22 NOTE — Progress Notes (Signed)
NAME:  Ronald [name], MRN:  [id number], DOB:  15-Oct-1955, LOS: 4 ADMISSION DATE:  03/17/2019, CONSULTATION DATE:  03/18/2019 REFERRING MD:  ED, CHIEF COMPLAINT:  Acute encephalopathy  Brief History   63 year old man with history of cocaine, tobacco and alcohol use, gout, hypertension, critical limb ischemia s/p left BKA, remote subdural hematoma presenting with acute encephalopathy, left-sided weakness, and seizure-like activity. LTM EEG negative for seizure.   History of present illness   63 year old man with history of cocaine, tobacco and alcohol use, gout, hypertension, critical limb ischemia s/p left BKA, remote subdural hematoma presenting with acute encephalopathy, left-sided weakness.  He was checked on 03/16/2019 and was noted to be encephalopathic at that time.  He was checked and on again the following day and he was found down in urine and less responsive.  He was evaluated by EMS and code stroke called. HPI obtained from EMR, ED due to patient's encephalopathy  Past Medical History  Cocaine, tobacco and alcohol use, gout, hypertension, critical limb ischemia s/p left BKA, remote subdural hematoma  Significant Hospital Events   7/5> admit  Consults:  Neuro 7/5>  Procedures:  7/5 EEG >> No seizure activity  Significant Diagnostic Tests:  CXR 7/5 > No focal airspace disease or effusion. Normal heart size. No pneumothorax.  MR brain w/o 7/5 > No acute intracranial abnormality  CTA head/neck 7/5> Negative CT brain perfusion. Patent carotid and vertebral arteries. No dissection, aneurysm, or hemodynamically significant stenosis. Patent anterior and posterior intracranial circulation. No large vessel occlusion, aneurysm, or significant stenosis. 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   CT head w/o 7/5>no acute cortically based infarct or acute  intracranial hemorrhage identified. Chronic small vessel disease with progression in the cerebral white matter since 2015.   Echo 7/6 > EF 50-55%, LVH w/ hypokinesis and low systolic function. L lateral pleural effusion  CXR 7/6 > R medial and L basilar atelectasis/infiltrates  Micro Data:  SARS CoV 2 & MRSA 7/5 - Negative BCx 7/5> (Pending) NGTD Resp Cx 7/5> (Pending) Growing few strep pneumo  Antimicrobials:  none  Interim history/subjective:  Calm overnight.  Has required small amount of lorazepam overnight.  Objective   Blood pressure (!) 166/95, pulse 85, temperature 98.9 F (37.2 C), temperature source Axillary, resp. rate (!) 23, height 5\' 4"  (1.626 m), weight 66.3 kg, SpO2 100 %.        Intake/Output Summary (Last 24 hours) at 03/22/2019 0722 Last data filed at 03/22/2019 0500 Gross per 24 hour  Intake 539.15 ml  Output 875 ml  Net -335.85 ml   Filed Weights   03/18/19 0248 03/19/19 0500 03/22/19 0500  Weight: 67.7 kg 71.2 kg 66.3 kg    Examination: General: NAD. Lying comfortably in bed, watching TV HENT: Pupils equal. MMM. Poor dentition. Lungs: CTA anteriorly. Normal WOB on RA Cardiovascular: RRR. No m/r/g Abdomen: soft, nondistended. +BS Extremities: L BKA. Warm. No peripheral edema. PICC line site intact. Neuro: Alert and interactive. Following commands and moving all extremities.  Generalized weakness with no focality, mild tremor   Assessment & Plan:   Acute encephalopathy due to combination of hypertension, cocaine use and possible alcohol withdrawal. Calmer but still confused, still believes he is at home. EEG and imaging negative. -Continue CIWA and allow time for mental status to clear   Severe hypokalemia now resolved. Consistent with alcohol abuse.  Hypertension at baseline on Maxzide  at home. Mild hypertension now. May still be consistent with withdrawal state.  - Reassess need for antihypertensive agents on discharge.  Best practice:   Diet: advance diet. Pain/Anxiety/Delirium protocol (if indicated): Continue CIWA VAP protocol (if indicated): N/A DVT prophylaxis: SubQ Lovenox GI prophylaxis: N/A Glucose control: monitor Mobility: PT  Code Status: FULL Family Communication: sister Rosetta was updated 7/9. Disposition: Medically stable for transfer to floor. Order reconciliation complete and TRH notified.  35 min spent with >50% time in counseling and coordination of care.    Lynnell Catalanavi Lenda Baratta, MD [facility] ICU Physician [facility]  Pager: [phone] Mobile: [phone] After hours: [phone].  03/22/2019, 7:23 AM

## 2019-03-23 ENCOUNTER — Inpatient Hospital Stay (HOSPITAL_COMMUNITY): Payer: Medicaid Other

## 2019-03-23 DIAGNOSIS — J9601 Acute respiratory failure with hypoxia: Secondary | ICD-10-CM

## 2019-03-23 LAB — CULTURE, BLOOD (ROUTINE X 2)
Culture: NO GROWTH
Culture: NO GROWTH
Special Requests: ADEQUATE

## 2019-03-23 LAB — PHOSPHORUS: Phosphorus: 3.5 mg/dL (ref 2.5–4.6)

## 2019-03-23 MED ORDER — RESOURCE THICKENUP CLEAR PO POWD
ORAL | Status: DC | PRN
  Filled 2019-03-23: qty 125

## 2019-03-23 NOTE — Progress Notes (Signed)
Physical Therapy Treatment Patient Details Name: Ronald [name] MRN: [id number] DOB: 08/09/1956 Today's Date: 03/23/2019    History of Present Illness Pt is a 63 y.o. M with significant PMH of cocaine, tobacco and alcohol use, gout, hypertension, critical limb iscehmia s/p left BKA, remote subdural hematoma who presents with acute encephalopathy, left sided weakness, and seizure like activity. LTM EEG negative for seizures. MRI 7/5 negative for acute abnormality. CXR 7/6 showing R medial and L basilar atelectasis.    PT Comments    Pt demonstrated improved cognition this session, allowing him to actively participate in PT. Pt was able to progress EOB for ~12 min and performed LE ther ex. Plan to attempt lateral transfer next session. Will continue to follow acutely.     Follow Up Recommendations  SNF;Supervision/Assistance - 24 hour     Equipment Recommendations  None recommended by PT    Recommendations for Other Services       Precautions / Restrictions Precautions Precautions: Fall;Other (comment) Precaution Comments: left BKA Restrictions Weight Bearing Restrictions: No    Mobility  Bed Mobility Overal bed mobility: Needs Assistance Bed Mobility: Supine to Sit;Sit to Supine Rolling: Mod assist   Supine to sit: Mod assist Sit to supine: Min guard   General bed mobility comments: Mod A for pt to progress LEs off EOB and elevate trunk. Min guard for pt to return to supine.  Transfers                 General transfer comment: Pt performs lateral transfers at baseline. No WC or drop arm available at time of treatment.  Ambulation/Gait                 Stairs             Wheelchair Mobility    Modified Rankin (Stroke Patients Only) Modified Rankin (Stroke Patients Only) Pre-Morbid Rankin Score: Moderate disability Modified Rankin: Severe disability     Balance Overall balance assessment: Needs assistance Sitting-balance support: Single  extremity supported(R foot supported) Sitting balance-Leahy Scale: Fair Sitting balance - Comments: performed seated HEP at EOB and maintained seated position for ~12 min with supervision for safety.                                    Cognition Arousal/Alertness: Awake/alert Behavior During Therapy: WFL for tasks assessed/performed;Flat affect Overall Cognitive Status: Within Functional Limits for tasks assessed                                 General Comments: Cognition inproved this session. pt A&O x4, following commands, and responding to questions.       Exercises General Exercises - Lower Extremity Long Arc Quad: AROM;Right;10 reps;Seated Heel Slides: AROM;Right;10 reps;Supine    General Comments        Pertinent Vitals/Pain Pain Assessment: No/denies pain Faces Pain Scale: No hurt    Home Living                      Prior Function            PT Goals (current goals can now be found in the care plan section) Acute Rehab PT Goals Patient Stated Goal: "have a beer." PT Goal Formulation: With patient Time For Goal Achievement: 04/04/19 Potential to Achieve Goals: Fair Progress towards PT goals:  Progressing toward goals    Frequency    Min 2X/week      PT Plan Current plan remains appropriate    Co-evaluation              AM-PAC PT "6 Clicks" Mobility   Outcome Measure  Help needed turning from your back to your side while in a flat bed without using bedrails?: A Little Help needed moving from lying on your back to sitting on the side of a flat bed without using bedrails?: A Lot Help needed moving to and from a bed to a chair (including a wheelchair)?: A Lot Help needed standing up from a chair using your arms (e.g., wheelchair or bedside chair)?: Total Help needed to walk in hospital room?: Total Help needed climbing 3-5 steps with a railing? : Total 6 Click Score: 10    End of Session   Activity  Tolerance: Patient tolerated treatment well Patient left: in bed;with call bell/phone within reach;with bed alarm set Nurse Communication: Mobility status PT Visit Diagnosis: Other abnormalities of gait and mobility (R26.89);Muscle weakness (generalized) (M62.81)     Time: 4627-0350 PT Time Calculation (min) (ACUTE ONLY): 30 min  Charges:  $Therapeutic Exercise: 8-22 mins $Therapeutic Activity: 8-22 mins                     Benjiman Core, [place] Pager [phone] Acute Rehab  Allena Katz 03/23/2019, 2:09 PM

## 2019-03-23 NOTE — Progress Notes (Signed)
Nutrition Follow-up  RD working remotely.  DOCUMENTATION CODES:   Not applicable  INTERVENTION:   -Continue Ensure Enlive po TID, each supplement provides 350 kcal and 20 grams of protein -Continue MVI with minerals daily  NUTRITION DIAGNOSIS:   Moderate Malnutrition related to chronic illness(drug/ETOH abuse) as evidenced by moderate fat depletion, moderate muscle depletion.  Ongoing  GOAL:   Patient will meet greater than or equal to 90% of their needs  Progressing   MONITOR:   PO intake, Supplement acceptance, Diet advancement, Weight trends, Skin, I & O's  REASON FOR ASSESSMENT:   Consult, Ventilator Enteral/tube feeding initiation and management  ASSESSMENT:   Pt with PMH of cocaine, tobacco, and ETOH abuse, HTN, critial limb ischemia s/p L BKA, remote SDH now admitted 7/5 with acute encephalopathy and L sided weakness. Possible alcohol withdrawal seizures.  7/6- PICC placed 7/7- extubated, OGT removed, TF d/c, advanced to full liquid diet 7/9- s/p BSE- advanced to dysphagia 1 diet with nectar thick liquids 7/10- s/p MBSS- advanced to dysphagia 3 diet with thin liquids  Reviewed I/O's: -200 ml x 24 hours and +459 since admission  UOP: 350 ml x 24 hours  Per nursing notes, pt with periods of agitation.   Pt just advanced to a dysphagia 3 diet with thin liquids. Noted good appetite; meal completion 75%. Pt initially refusing Ensure supplements, however, has accepted the last two doses.   Therapies recommending SNF at discharge.   Medications reviewed and include folvite and thiamine.   Labs reviewed: Phos WDL, CBGS: 72-111.   Diet Order:   Diet Order            DIET DYS 3 Room service appropriate? No; Fluid consistency: Thin  Diet effective now              EDUCATION NEEDS:   No education needs have been identified at this time  Skin:  Skin Assessment: Reviewed RN Assessment  Last BM:  03/22/19  Height:   Ht Readings from Last 1  Encounters:  03/18/19 5\' 4"  (1.626 m)    Weight:   Wt Readings from Last 1 Encounters:  03/22/19 66.3 kg    Ideal Body Weight:  55 kg  BMI:  Body mass index is 25.09 kg/m.  Estimated Nutritional Needs:   Kcal:  2150-2350  Protein:  110-125 grams  Fluid:  > 2.1 L    [place] Blizzard A. Jimmye Norman, RD, LDN, [place] Registered Dietitian II Certified Diabetes Care and Education Specialist Pager: [phone] After hours Pager: [phone]

## 2019-03-23 NOTE — Progress Notes (Signed)
Spoke with the pt's sister, and gave her an update on the pt's status. RN to continue to monitor.

## 2019-03-23 NOTE — Progress Notes (Signed)
°PROGRESS NOTE ° ° ° °Ronald [name]  MRN:[id number] DOB: 09/02/1956 DOA: 03/17/2019 °PCP: Dean, Eric L, MD ° ° °Brief Narrative:  °63-year-old gentleman with a history of cocaine, tobacco and alcohol use, gout, hypertension, critical limb ischemia status post left BKA, remote subdural hematoma was brought into ED with acute encephalopathy as he was found down in the urine and less responsive.  Code stroke was done on him in the field., left-sided weakness and seizure-like activity.  He was admitted under PCCM, intubated for airway protection and extubated.  LTM EEG negative for seizure.  He was transferred under TRH service starting 17 2020. ° °Consultants:  °· None ° °Procedures:  °· Intubation ° °Antimicrobials:  °· None ° ° °Subjective: °Patient seen and examined.  He was alert and oriented to time place and person.  The only thing that he missed was that he thought he was hospitalized only yesterday.  He had no other complaint. ° °Objective: °Vitals:  ° 03/22/19 1855 03/22/19 1858 03/22/19 2111 03/23/19 0635  °BP: (!) 152/125 (!) 150/88 (!) 151/96 133/86  °Pulse: (!) 110 83 (!) 108 97  °Resp: 18  (!) 22 18  °Temp: 98.5 °F (36.9 °C) 98.2 °F (36.8 °C) 99 °F (37.2 °C) 98.8 °F (37.1 °C)  °TempSrc: Oral Oral    °SpO2: 96%  100% 97%  °Weight:      °Height:      ° ° °Intake/Output Summary (Last 24 hours) at 03/23/2019 1323 °Last data filed at 03/22/2019 1800 °Gross per 24 hour  °Intake 150 ml  °Output 350 ml  °Net -200 ml  ° °Filed Weights  ° 03/18/19 0248 03/19/19 0500 03/22/19 0500  °Weight: 67.7 kg 71.2 kg 66.3 kg  ° ° °Examination: ° °General exam: Appears calm and comfortable  °Respiratory system: Clear to auscultation. Respiratory effort normal. °Cardiovascular system: S1 & S2 heard, RRR. No JVD, murmurs, rubs, gallops or clicks. No pedal edema. °Gastrointestinal system: Abdomen is nondistended, soft and nontender. No organomegaly or masses felt. Normal bowel sounds heard. °Central nervous system: Alert and oriented.  No focal neurological deficits. °Extremities: Symmetric 5 x 5 power.  Left BKA °Skin: No rashes, lesions or ulcers °Psychiatry: Judgement and insight appear normal. Mood & affect appropriate.  ° ° °Data Reviewed: I have personally reviewed following labs and imaging studies ° °CBC: °Recent Labs  °Lab 03/17/19 °2213  03/18/19 °0038 03/18/19 °0631 03/19/19 °0628 03/20/19 °0437 03/21/19 °0507  °WBC 13.3*  --   --  8.0 8.2 7.0 7.1  °NEUTROABS 11.1*  --   --   --   --   --   --   °HGB 13.9   < > 12.9* 11.7* 10.3* 10.2* 11.5*  °HCT 37.7*   < > 38.0* 32.5* 29.3* 29.3* 33.6*  °MCV 91.3  --   --  92.3 97.0 99.0 97.7  °PLT 291  --   --  269 238 238 284  ° < > = values in this interval not displayed.  ° °Basic Metabolic Panel: °Recent Labs  °Lab 03/17/19 °2217  03/18/19 °0421  03/19/19 °0628 03/20/19 °0437 03/21/19 °0507 03/21/19 °1944 03/22/19 °0453 03/23/19 °0215  °NA  --    < > 123*   < > 131* 135 135 136 137  --   °K  --    < > 3.9   < > 3.4* 3.2* 2.6* 3.9 3.7  --   °CL  --    < > 89*   < > 99 103 98   101 102  --   °CO2  --   --  21*   < > 22 23 25 24 23  --   °GLUCOSE  --    < > 82   < > 102* 118* 128* 111* 94  --   °BUN  --    < > 9   < > 6* 8 <5* <5* <5*  --   °CREATININE  --    < > 0.97   < > 0.62 0.61 0.58* 0.61 0.61  --   °CALCIUM  --   --  8.3*   < > 8.2* 8.5* 8.7* 9.0 8.9  --   °MG 1.8  --  1.6*  --  2.0 1.9  --   --   --   --   °PHOS  --    < > 3.4  --  2.9 3.2 2.7  --  3.8 3.5  ° < > = values in this interval not displayed.  ° °GFR: °Estimated Creatinine Clearance: 81.2 mL/min (by C-G formula based on SCr of 0.61 mg/dL). °Liver Function Tests: °Recent Labs  °Lab 03/17/19 °2213  °AST 32  °ALT 26  °ALKPHOS 57  °BILITOT 1.8*  °PROT 8.2*  °ALBUMIN 4.8  ° °No results for input(s): LIPASE, AMYLASE in the last 168 hours. °No results for input(s): AMMONIA in the last 168 hours. °Coagulation Profile: °Recent Labs  °Lab 03/17/19 °2213  °INR 1.1  ° °Cardiac Enzymes: °Recent Labs  °Lab 03/17/19 °2217  °CKTOTAL 341  ° °BNP  (last 3 results) °No results for input(s): PROBNP in the last 8760 hours. °HbA1C: °No results for input(s): HGBA1C in the last 72 hours. °CBG: °Recent Labs  °Lab 03/21/19 °2348 03/22/19 °0346 03/22/19 °0800 03/22/19 °1230 03/22/19 °1601  °GLUCAP 101* 82 72 99 111*  ° °Lipid Profile: °No results for input(s): CHOL, HDL, LDLCALC, TRIG, CHOLHDL, LDLDIRECT in the last 72 hours. °Thyroid Function Tests: °No results for input(s): TSH, T4TOTAL, FREET4, T3FREE, THYROIDAB in the last 72 hours. °Anemia Panel: °No results for input(s): VITAMINB12, FOLATE, FERRITIN, TIBC, IRON, RETICCTPCT in the last 72 hours. °Sepsis Labs: °Recent Labs  °Lab 03/18/19 °0013  °LATICACIDVEN 1.3  ° ° °Recent Results (from the past 240 hour(s))  °SARS Coronavirus 2 (CEPHEID - Performed in [facility] hospital lab), Hosp Order     Status: None  ° Collection Time: 03/18/19 12:09 AM  ° Specimen: Nasopharyngeal Swab  °Result Value Ref Range Status  ° SARS Coronavirus 2 NEGATIVE NEGATIVE Final  °  Comment: (NOTE) °If result is NEGATIVE °SARS-CoV-2 target nucleic acids are NOT DETECTED. °The SARS-CoV-2 RNA is generally detectable in upper and lower  °respiratory specimens during the acute phase of infection. The lowest  °concentration of SARS-CoV-2 viral copies this assay can detect is 250  °copies / mL. A negative result does not preclude SARS-CoV-2 infection  °and should not be used as the sole basis for treatment or other  °patient management decisions.  A negative result may occur with  °improper specimen collection / handling, submission of specimen other  °than nasopharyngeal swab, presence of viral mutation(s) within the  °areas targeted by this assay, and inadequate number of viral copies  °(<250 copies / mL). A negative result must be combined with clinical  °observations, patient history, and epidemiological information. °If result is POSITIVE °SARS-CoV-2 target nucleic acids are DETECTED. °The SARS-CoV-2 RNA is generally detectable in upper  and lower  °respiratory specimens dur °ing the acute phase of infection.    Positive  results are indicative of active infection with SARS-CoV-2.  Clinical  correlation with patient history and other diagnostic information is  necessary to determine patient infection status.  Positive results do  not rule out bacterial infection or co-infection with other viruses. If result is PRESUMPTIVE POSTIVE SARS-CoV-2 nucleic acids MAY BE PRESENT.   A presumptive positive result was obtained on the submitted specimen  and confirmed on repeat testing.  While 2019 novel coronavirus  (SARS-CoV-2) nucleic acids may be present in the submitted sample  additional confirmatory testing may be necessary for epidemiological  and / or clinical management purposes  to differentiate between  SARS-CoV-2 and other Sarbecovirus currently known to infect humans.  If clinically indicated additional testing with an alternate test  methodology [phone]) is advised. The SARS-CoV-2 RNA is generally  detectable in upper and lower respiratory sp ecimens during the acute  phase of infection. The expected result is Negative. Fact Sheet for Patients:  StrictlyIdeas.no Fact Sheet for Healthcare Providers: BankingDealers.co.za This test is not yet approved or cleared by the [place] FDA and has been authorized for detection and/or diagnosis of SARS-CoV-2 by FDA under an Emergency Use Authorization (EUA).  This EUA will remain in effect (meaning this test can be used) for the duration of the COVID-19 declaration under Section 564(b)(1) of the Act, 21 U.S.C. section 360bbb-3(b)(1), unless the authorization is terminated or revoked sooner. Performed at [facility], [place] [address], [place], [place] [address]   MRSA PCR Screening     Status: None   Collection Time: 03/18/19  2:54 AM   Specimen: Nasal Mucosa; Nasopharyngeal  Result Value Ref Range Status   MRSA by PCR  NEGATIVE NEGATIVE Final    Comment:        The GeneXpert MRSA Assay (FDA approved for NASAL specimens only), is one component of a comprehensive MRSA colonization surveillance program. It is not intended to diagnose MRSA infection nor to guide or monitor treatment for MRSA infections. Performed at [facility], [place] [address] [place], [place] [address]   Culture, blood (routine x 2)     Status: None   Collection Time: 03/18/19  4:21 AM   Specimen: BLOOD  Result Value Ref Range Status   Specimen Description BLOOD RIGHT ANTECUBITAL  Final   Special Requests   Final    BOTTLES DRAWN AEROBIC AND ANAEROBIC Blood Culture adequate volume   Culture   Final    NO GROWTH 5 DAYS Performed at [facility], [place] [address] [place], [place] [address]    Report Status 03/23/2019 FINAL  Final  Culture, respiratory (tracheal aspirate)     Status: None   Collection Time: 03/18/19  5:55 AM   Specimen: Tracheal Aspirate; Respiratory  Result Value Ref Range Status   Specimen Description TRACHEAL ASPIRATE  Final   Special Requests NONE  Final   Gram Stain   Final    ABUNDANT WBC PRESENT, PREDOMINANTLY PMN MODERATE GRAM POSITIVE COCCI RARE GRAM NEGATIVE RODS Performed at [facility], [place] [address], [place], [place] [address]    Culture FEW STREPTOCOCCUS PNEUMONIAE  Final   Report Status 03/21/2019 FINAL  Final   Organism ID, Bacteria STREPTOCOCCUS PNEUMONIAE  Final      Susceptibility   Streptococcus pneumoniae - MIC*    ERYTHROMYCIN >=8 RESISTANT Resistant     LEVOFLOXACIN 2 SENSITIVE Sensitive     VANCOMYCIN 0.5 SENSITIVE Sensitive     PENICILLIN (non-meningitis) 1 SENSITIVE  Sensitive   °  CEFTRIAXONE (non-meningitis) 0.5 SENSITIVE Sensitive   °  * FEW STREPTOCOCCUS PNEUMONIAE  °Culture, blood (routine x 2)     Status: None  ° Collection Time: 03/18/19  6:31 AM  ° Specimen: BLOOD LEFT HAND  °Result Value Ref Range Status  ° Specimen Description BLOOD LEFT HAND  Final  °  Special Requests   Final  °  BOTTLES DRAWN AEROBIC ONLY Blood Culture results may not be optimal due to an inadequate volume of blood received in culture bottles  ° Culture   Final  °  NO GROWTH 5 DAYS °Performed at [facility], [address] [place], [place] [address] °  ° Report Status 03/23/2019 FINAL  Final  °  ° ° °Radiology Studies: °Dg Swallowing Func-speech Pathology ° °Result Date: 03/23/2019 °Objective Swallowing Evaluation: Type of Study: MBS-Modified Barium Swallow Study  Patient Details Name: Kentavius Cilento MRN: [id number] Date of Birth: 09/04/1956 Today's Date: 03/23/2019 Time: SLP Start Time (ACUTE ONLY): 1130 -SLP Stop Time (ACUTE ONLY): 1145 SLP Time Calculation (min) (ACUTE ONLY): 15 min Past Medical History: Past Medical History: Diagnosis Date • Critical lower limb ischemia  • ETOH abuse  • Gout  • Hypertension  Past Surgical History: Past Surgical History: Procedure Laterality Date • ABDOMINAL AORTAGRAM N/A 12/26/2013  Procedure: ABDOMINAL AORTAGRAM;  Surgeon: [name], MD;  Location: [facility] CATH LAB;  Service: Cardiovascular;  Laterality: N/A; • AMPUTATION Left 12/28/2013  Procedure: LEFT BELOW KNEE AMPUTATION;  Surgeon: Marcus V Duda, MD;  Location: [facility] OR;  Service: Orthopedics;  Laterality: Left; • BELOW KNEE LEG AMPUTATION    Left • LEG SURGERY   HPI: Pt is a 62 y.o. M with significant PMH of cocaine, tobacco and alcohol use, gout, hypertension, critical limb iscehmia s/p left BKA, remote subdural hematoma who presents with acute encephalopathy, left sided weakness, and seizure like activity. LTM EEG negative for seizures. MRI 7/5 negative for acute abnormality. CXR 7/6 showing R medial and L basilar atelectasis.He was intubated on 7/4 and extubated on 7/7.  Subjective: cooperative, pleasant Assessment / Plan / Recommendation CHL IP CLINICAL IMPRESSIONS 03/23/2019 Clinical Impression Patient presents with a mild oropharyngeal dysphagia without penetration or aspiration and without  pharyngeal residuals. Orally, he had delays in mastication as well as anterior to posterior movement of boluses and brief holding of boluses. He exhibited delay in swallow initiation to level of vallecular sinus with all tested boluses. He was not able to transit a barirum tablet to back of oral cavity for swallow with puree solids or thin liquids. Overall, patient is at a mild risk of aspiration, but is safe to upgrade to Dys 3 thin liquids. SLP Visit Diagnosis Dysphagia, oropharyngeal phase (R13.12) Attention and concentration deficit following -- Frontal lobe and executive function deficit following -- Impact on safety and function Mild aspiration risk   CHL IP TREATMENT RECOMMENDATION 03/23/2019 Treatment Recommendations Therapy as outlined in treatment plan below   Prognosis 03/23/2019 Prognosis for Safe Diet Advancement Good Barriers to Reach Goals -- Barriers/Prognosis Comment -- CHL IP DIET RECOMMENDATION 03/23/2019 SLP Diet Recommendations Dysphagia 3 (Mech soft) solids;Thin liquid Liquid Administration via Cup;Straw Medication Administration Crushed with puree Compensations Minimize environmental distractions;Slow rate;Small sips/bites Postural Changes Seated upright at 90 degrees   CHL IP OTHER RECOMMENDATIONS 03/23/2019 Recommended Consults -- Oral Care Recommendations Oral care BID Other Recommendations --   CHL IP FOLLOW UP RECOMMENDATIONS 03/23/2019 Follow up Recommendations None   CHL IP FREQUENCY AND DURATION 03/23/2019   Speech Therapy Frequency (ACUTE ONLY) min 1 x/week Treatment Duration 1 week      CHL IP ORAL PHASE 03/23/2019 Oral Phase Impaired Oral - Pudding Teaspoon -- Oral - Pudding Cup -- Oral - Honey Teaspoon -- Oral - Honey Cup -- Oral - Nectar Teaspoon -- Oral - Nectar Cup Reduced posterior propulsion;Premature spillage;Delayed oral transit Oral - Nectar Straw -- Oral - Thin Teaspoon -- Oral - Thin Cup Premature spillage;Delayed oral transit;Holding of bolus;Reduced posterior propulsion Oral -  Thin Straw -- Oral - Puree Reduced posterior propulsion;Delayed oral transit;Premature spillage Oral - Mech Soft -- Oral - Regular Impaired mastication;Delayed oral transit Oral - Multi-Consistency -- Oral - Pill Reduced posterior propulsion;Other (Comment) Oral Phase - Comment --  CHL IP PHARYNGEAL PHASE 03/23/2019 Pharyngeal Phase Impaired Pharyngeal- Pudding Teaspoon -- Pharyngeal -- Pharyngeal- Pudding Cup -- Pharyngeal -- Pharyngeal- Honey Teaspoon -- Pharyngeal -- Pharyngeal- Honey Cup -- Pharyngeal -- Pharyngeal- Nectar Teaspoon -- Pharyngeal -- Pharyngeal- Nectar Cup Delayed swallow initiation-vallecula Pharyngeal -- Pharyngeal- Nectar Straw -- Pharyngeal -- Pharyngeal- Thin Teaspoon -- Pharyngeal -- Pharyngeal- Thin Cup Delayed swallow initiation-vallecula Pharyngeal -- Pharyngeal- Thin Straw Delayed swallow initiation-vallecula Pharyngeal -- Pharyngeal- Puree Delayed swallow initiation-vallecula Pharyngeal -- Pharyngeal- Mechanical Soft -- Pharyngeal -- Pharyngeal- Regular Delayed swallow initiation-vallecula Pharyngeal -- Pharyngeal- Multi-consistency -- Pharyngeal -- Pharyngeal- Pill -- Pharyngeal -- Pharyngeal Comment --  CHL IP CERVICAL ESOPHAGEAL PHASE 03/23/2019 Cervical Esophageal Phase WFL Pudding Teaspoon -- Pudding Cup -- Honey Teaspoon -- Honey Cup -- Nectar Teaspoon -- Nectar Cup -- Nectar Straw -- Thin Teaspoon -- Thin Cup -- Thin Straw -- Puree -- Mechanical Soft -- Regular -- Multi-consistency -- Pill -- Cervical Esophageal Comment -- [name] 03/23/2019, 12:59 PM  Sonia Baller, MA, CCC-SLP Speech Therapy [facility] Pager: [phone]              Scheduled Meds:  chlorhexidine gluconate (MEDLINE KIT)  15 mL Mouth Rinse BID   Chlorhexidine Gluconate Cloth  6 each Topical Q0600   enoxaparin (LOVENOX) injection  40 mg Subcutaneous Daily   feeding supplement (ENSURE ENLIVE)  237 mL Oral TID BM   folic acid  1 mg Per Tube Daily   LORazepam  0-4 mg Intravenous Q12H     multivitamin with minerals  1 tablet Oral Daily   thiamine  100 mg Oral Daily   Continuous Infusions:  sodium chloride       LOS: 5 days   Assessment & Plan:   Active Problems:   Acute encephalopathy   Episode of unresponsiveness/acute encephalopathy and possible cardiac arrest: CODE BLUE was run in the field.  He was intubated by PCCM and subsequently was extubated.  He is completely alert and oriented at this point in time with no symptoms.  Hypokalemia: Resolved.  Hypertension: History of hypertension but does not seem to be on any medications.  Currently blood pressure is better.  Continue to monitor off of any antihypertensives for now.  Alcohol abuse: No signs of withdrawal at this point in time.  Continue PRN Ativan in case as needed.  Dysphagia: SLP on board.  Scheduled to have modified barium swallow today.  On dysphagia diet 3.  This is likely due to intubation.  Generalized deconditioning: Evaluated by PT OT and they recommend SNF.  Due to being Medicaid, per case manager, it will take few days to arrange a bed for him.  DVT prophylaxis: Lovenox Code Status: Full code Family Communication: None present at bedside. Disposition Plan: Likely  to SNF when bed available.   Time spent: 30 minutes   Darliss Cheney, MD [facility] Pager [phone]  If 7PM-7AM, please contact night-coverage [email or web address] Password [email or web address] 03/23/2019, 1:23 PM

## 2019-03-23 NOTE — Progress Notes (Signed)
RN attempted to call pt's family. Pt declined for RN to call family members. RN to continue to monitor.

## 2019-03-24 ENCOUNTER — Other Ambulatory Visit: Payer: Self-pay

## 2019-03-24 DIAGNOSIS — I10 Essential (primary) hypertension: Secondary | ICD-10-CM

## 2019-03-24 LAB — COMPREHENSIVE METABOLIC PANEL
ALT: 35 U/L (ref 0–44)
AST: 55 U/L — ABNORMAL HIGH (ref 15–41)
Albumin: 3.4 g/dL — ABNORMAL LOW (ref 3.5–5.0)
Alkaline Phosphatase: 49 U/L (ref 38–126)
Anion gap: 12 (ref 5–15)
BUN: 12 mg/dL (ref 8–23)
CO2: 23 mmol/L (ref 22–32)
Calcium: 9.2 mg/dL (ref 8.9–10.3)
Chloride: 105 mmol/L (ref 98–111)
Creatinine, Ser: 0.66 mg/dL (ref 0.61–1.24)
GFR calc Af Amer: >60 mL/min (ref >60)
GFR calc non Af Amer: >60 mL/min (ref >60)
Glucose, Bld: 99 mg/dL (ref 70–99)
Potassium: 3.9 mmol/L (ref 3.5–5.1)
Sodium: 140 mmol/L (ref 135–145)
Total Bilirubin: 0.6 mg/dL (ref 0.3–1.2)
Total Protein: 6.9 g/dL (ref 6.5–8.1)

## 2019-03-24 LAB — CBC WITH DIFFERENTIAL/PLATELET
Abs Immature Granulocytes: 0.09 10*3/uL — ABNORMAL HIGH (ref 0.00–0.07)
Basophils Absolute: 0 10*3/uL (ref 0.0–0.1)
Basophils Relative: 1 %
Eosinophils Absolute: 0.2 10*3/uL (ref 0.0–0.5)
Eosinophils Relative: 3 %
HCT: 35.5 % — ABNORMAL LOW (ref 39.0–52.0)
Hemoglobin: 12.1 g/dL — ABNORMAL LOW (ref 13.0–17.0)
Immature Granulocytes: 1 %
Lymphocytes Relative: 26 %
Lymphs Abs: 1.9 10*3/uL (ref 0.7–4.0)
MCH: 33.7 pg (ref 26.0–34.0)
MCHC: 34.1 g/dL (ref 30.0–36.0)
MCV: 98.9 fL (ref 80.0–100.0)
Monocytes Absolute: 1.1 10*3/uL — ABNORMAL HIGH (ref 0.1–1.0)
Monocytes Relative: 15 %
Neutro Abs: 3.9 10*3/uL (ref 1.7–7.7)
Neutrophils Relative %: 54 %
Platelets: 360 10*3/uL (ref 150–400)
RBC: 3.59 MIL/uL — ABNORMAL LOW (ref 4.22–5.81)
RDW: 13.3 % (ref 11.5–15.5)
WBC: 7.2 10*3/uL (ref 4.0–10.5)
nRBC: 0 % (ref 0.0–0.2)

## 2019-03-24 LAB — MAGNESIUM: Magnesium: 1.8 mg/dL (ref 1.7–2.4)

## 2019-03-24 MED ORDER — HYDRALAZINE HCL 20 MG/ML IJ SOLN
10.0000 mg | Freq: Four times a day (QID) | INTRAMUSCULAR | Status: DC | PRN
  Filled 2019-03-24: qty 1

## 2019-03-24 NOTE — Progress Notes (Signed)
PROGRESS NOTE    Ronald [name]  OJJ:[id number] DOB: 10/13/1955 DOA: 03/17/2019 PCP: Rogers Blocker, MD   Brief Narrative:  63 year old gentleman with a history of cocaine, tobacco and alcohol use, gout, hypertension, critical limb ischemia status post left BKA, remote subdural hematoma was brought into ED with acute encephalopathy as he was found down in the urine and less responsive.  Code stroke was done on him in the field., left-sided weakness and seizure-like activity.  He was admitted under PCCM, intubated for airway protection and extubated.  LTM EEG negative for seizure.  He was transferred under [facility] service starting 17 2020.  Consultants:   None  Procedures:   Intubation  Antimicrobials:   None   Subjective: Patient seen and examined.  He has no complaints.  He is completely alert and oriented.  Objective: Vitals:   03/23/19 1331 03/23/19 2246 03/24/19 0510 03/24/19 1255  BP: (!) 152/92 (!) 174/105 133/88 (!) 177/102  Pulse: (!) 105 (!) 108 95 96  Resp: (!) 21 16 16 20   Temp: 97.7 F (36.5 C) 98.7 F (37.1 C) 98.2 F (36.8 C) 99 F (37.2 C)  TempSrc: Oral Oral Oral Oral  SpO2: 98% 100% 100% 99%  Weight:      Height:        Intake/Output Summary (Last 24 hours) at 03/24/2019 1331 Last data filed at 03/24/2019 0455 Gross per 24 hour  Intake 557 ml  Output 650 ml  Net -93 ml   Filed Weights   03/18/19 0248 03/19/19 0500 03/22/19 0500  Weight: 67.7 kg 71.2 kg 66.3 kg    Examination: General exam: Appears calm and comfortable  Respiratory system: Clear to auscultation. Respiratory effort normal. Cardiovascular system: S1 & S2 heard, RRR. No JVD, murmurs, rubs, gallops or clicks. No pedal edema. Gastrointestinal system: Abdomen is nondistended, soft and nontender. No organomegaly or masses felt. Normal bowel sounds heard. Central nervous system: Alert and oriented. No focal neurological deficits. Extremities: Symmetric 5 x 5 power.  Left BKA Skin: No  rashes, lesions or ulcers Psychiatry: Judgement and insight appear normal. Mood & affect appropriate.   Data Reviewed: I have personally reviewed following labs and imaging studies  CBC: Recent Labs  Lab 03/17/19 2213  03/18/19 0631 03/19/19 0628 03/20/19 0437 03/21/19 0507 03/24/19 0407  WBC 13.3*  --  8.0 8.2 7.0 7.1 7.2  NEUTROABS 11.1*  --   --   --   --   --  3.9  HGB 13.9   < > 11.7* 10.3* 10.2* 11.5* 12.1*  HCT 37.7*   < > 32.5* 29.3* 29.3* 33.6* 35.5*  MCV 91.3  --  92.3 97.0 99.0 97.7 98.9  PLT 291  --  269 238 238 284 360   < > = values in this interval not displayed.   Basic Metabolic Panel: Recent Labs  Lab 03/17/19 2217  03/18/19 0421  03/19/19 9371 03/20/19 6967 03/21/19 0507 03/21/19 1944 03/22/19 0453 03/23/19 0215 03/24/19 0407  NA  --    < > 123*   < > 131* 135 135 136 137  --  140  K  --    < > 3.9   < > 3.4* 3.2* 2.6* 3.9 3.7  --  3.9  CL  --    < > 89*   < > 99 103 98 101 102  --  105  CO2  --   --  21*   < > 22 23 25 24 23   --  23  GLUCOSE  --    < > 82   < > 102* 118* 128* 111* 94  --  99  BUN  --    < > 9   < > 6* 8 <5* <5* <5*  --  12  CREATININE  --    < > 0.97   < > 0.62 0.61 0.58* 0.61 0.61  --  0.66  CALCIUM  --   --  8.3*   < > 8.2* 8.5* 8.7* 9.0 8.9  --  9.2  MG 1.8  --  1.6*  --  2.0 1.9  --   --   --   --  1.8  PHOS  --    < > 3.4  --  2.9 3.2 2.7  --  3.8 3.5  --    < > = values in this interval not displayed.   GFR: Estimated Creatinine Clearance: 81.2 mL/min (by C-G formula based on SCr of 0.66 mg/dL). Liver Function Tests: Recent Labs  Lab 03/17/19 2213 03/24/19 0407  AST 32 55*  ALT 26 35  ALKPHOS 57 49  BILITOT 1.8* 0.6  PROT 8.2* 6.9  ALBUMIN 4.8 3.4*   No results for input(s): LIPASE, AMYLASE in the last 168 hours. No results for input(s): AMMONIA in the last 168 hours. Coagulation Profile: Recent Labs  Lab 03/17/19 2213  INR 1.1   Cardiac Enzymes: Recent Labs  Lab 03/17/19 2217  CKTOTAL 341   BNP (last  3 results) No results for input(s): PROBNP in the last 8760 hours. HbA1C: No results for input(s): HGBA1C in the last 72 hours. CBG: Recent Labs  Lab 03/21/19 2348 03/22/19 0346 03/22/19 0800 03/22/19 1230 03/22/19 1601  GLUCAP 101* 82 72 99 111*   Lipid Profile: No results for input(s): CHOL, HDL, LDLCALC, TRIG, CHOLHDL, LDLDIRECT in the last 72 hours. Thyroid Function Tests: No results for input(s): TSH, T4TOTAL, FREET4, T3FREE, THYROIDAB in the last 72 hours. Anemia Panel: No results for input(s): VITAMINB12, FOLATE, FERRITIN, TIBC, IRON, RETICCTPCT in the last 72 hours. Sepsis Labs: Recent Labs  Lab 03/18/19 0013  LATICACIDVEN 1.3    Recent Results (from the past 240 hour(s))  SARS Coronavirus 2 (CEPHEID - Performed in [facility] hospital lab), Hosp Order     Status: None   Collection Time: 03/18/19 12:09 AM   Specimen: Nasopharyngeal Swab  Result Value Ref Range Status   SARS Coronavirus 2 NEGATIVE NEGATIVE Final    Comment: (NOTE) If result is NEGATIVE SARS-CoV-2 target nucleic acids are NOT DETECTED. The SARS-CoV-2 RNA is generally detectable in upper and lower  respiratory specimens during the acute phase of infection. The lowest  concentration of SARS-CoV-2 viral copies this assay can detect is 250  copies / mL. A negative result does not preclude SARS-CoV-2 infection  and should not be used as the sole basis for treatment or other  patient management decisions.  A negative result may occur with  improper specimen collection / handling, submission of specimen other  than nasopharyngeal swab, presence of viral mutation(s) within the  areas targeted by this assay, and inadequate number of viral copies  (<250 copies / mL). A negative result must be combined with clinical  observations, patient history, and epidemiological information. If result is POSITIVE SARS-CoV-2 target nucleic acids are DETECTED. The SARS-CoV-2 RNA is generally detectable in upper and  lower  respiratory specimens dur ing the acute phase of infection.  Positive  results are indicative of active infection with  SARS-CoV-2.  Clinical  correlation with patient history and other diagnostic information is  necessary to determine patient infection status.  Positive results do  not rule out bacterial infection or co-infection with other viruses. If result is PRESUMPTIVE POSTIVE SARS-CoV-2 nucleic acids MAY BE PRESENT.   A presumptive positive result was obtained on the submitted specimen  and confirmed on repeat testing.  While 2019 novel coronavirus  (SARS-CoV-2) nucleic acids may be present in the submitted sample  additional confirmatory testing may be necessary for epidemiological  and / or clinical management purposes  to differentiate between  SARS-CoV-2 and other Sarbecovirus currently known to infect humans.  If clinically indicated additional testing with an alternate test  methodology [phone]) is advised. The SARS-CoV-2 RNA is generally  detectable in upper and lower respiratory sp ecimens during the acute  phase of infection. The expected result is Negative. Fact Sheet for Patients:  StrictlyIdeas.no Fact Sheet for Healthcare Providers: BankingDealers.co.za This test is not yet approved or cleared by the [place] FDA and has been authorized for detection and/or diagnosis of SARS-CoV-2 by FDA under an Emergency Use Authorization (EUA).  This EUA will remain in effect (meaning this test can be used) for the duration of the COVID-19 declaration under Section 564(b)(1) of the Act, 21 U.S.C. section 360bbb-3(b)(1), unless the authorization is terminated or revoked sooner. Performed at [facility], [place] [address], [place], [place] [address]   MRSA PCR Screening     Status: None   Collection Time: 03/18/19  2:54 AM   Specimen: Nasal Mucosa; Nasopharyngeal  Result Value Ref Range Status   MRSA by PCR  NEGATIVE NEGATIVE Final    Comment:        The GeneXpert MRSA Assay (FDA approved for NASAL specimens only), is one component of a comprehensive MRSA colonization surveillance program. It is not intended to diagnose MRSA infection nor to guide or monitor treatment for MRSA infections. Performed at [facility], [place] [address] [place], [place] [address]   Culture, blood (routine x 2)     Status: None   Collection Time: 03/18/19  4:21 AM   Specimen: BLOOD  Result Value Ref Range Status   Specimen Description BLOOD RIGHT ANTECUBITAL  Final   Special Requests   Final    BOTTLES DRAWN AEROBIC AND ANAEROBIC Blood Culture adequate volume   Culture   Final    NO GROWTH 5 DAYS Performed at [facility], [place] [address] [place], [place] [address]    Report Status 03/23/2019 FINAL  Final  Culture, respiratory (tracheal aspirate)     Status: None   Collection Time: 03/18/19  5:55 AM   Specimen: Tracheal Aspirate; Respiratory  Result Value Ref Range Status   Specimen Description TRACHEAL ASPIRATE  Final   Special Requests NONE  Final   Gram Stain   Final    ABUNDANT WBC PRESENT, PREDOMINANTLY PMN MODERATE GRAM POSITIVE COCCI RARE GRAM NEGATIVE RODS Performed at [facility], [place] [address], [place], [place] [address]    Culture FEW STREPTOCOCCUS PNEUMONIAE  Final   Report Status 03/21/2019 FINAL  Final   Organism ID, Bacteria STREPTOCOCCUS PNEUMONIAE  Final      Susceptibility   Streptococcus pneumoniae - MIC*    ERYTHROMYCIN >=8 RESISTANT Resistant     LEVOFLOXACIN 2 SENSITIVE Sensitive     VANCOMYCIN 0.5 SENSITIVE Sensitive     PENICILLIN (non-meningitis) 1 SENSITIVE Sensitive     CEFTRIAXONE (non-meningitis) 0.5 SENSITIVE  Sensitive     * FEW STREPTOCOCCUS PNEUMONIAE  Culture, blood (routine x 2)     Status: None   Collection Time: 03/18/19  6:31 AM   Specimen: BLOOD LEFT HAND  Result Value Ref Range Status   Specimen Description BLOOD LEFT HAND  Final    Special Requests   Final    BOTTLES DRAWN AEROBIC ONLY Blood Culture results may not be optimal due to an inadequate volume of blood received in culture bottles   Culture   Final    NO GROWTH 5 DAYS Performed at [facility], [place] [address] [place], [place] [address]    Report Status 03/23/2019 FINAL  Final      Radiology Studies: Dg Swallowing Func-speech Pathology  Result Date: 03/23/2019 Objective Swallowing Evaluation: Type of Study: MBS-Modified Barium Swallow Study  Patient Details Name: Ronald [name] MRN: [id number] Date of Birth: Mar 07, 1956 Today's Date: 03/23/2019 Time: SLP Start Time (ACUTE ONLY): 1130 -SLP Stop Time (ACUTE ONLY): 1145 SLP Time Calculation (min) (ACUTE ONLY): 15 min Past Medical History: Past Medical History: Diagnosis Date  Critical lower limb ischemia   ETOH abuse   Gout   Hypertension  Past Surgical History: Past Surgical History: Procedure Laterality Date  ABDOMINAL AORTAGRAM N/A 12/26/2013  Procedure: ABDOMINAL Maxcine Ham;  Surgeon: Serafina Mitchell, MD;  Location: [facility] CATH LAB;  Service: Cardiovascular;  Laterality: N/A;  AMPUTATION Left 12/28/2013  Procedure: LEFT BELOW KNEE AMPUTATION;  Surgeon: Newt Minion, MD;  Location: [place];  Service: Orthopedics;  Laterality: Left;  BELOW KNEE LEG AMPUTATION    Left  LEG SURGERY   HPI: Pt is a 63 y.o. M with significant PMH of cocaine, tobacco and alcohol use, gout, hypertension, critical limb iscehmia s/p left BKA, remote subdural hematoma who presents with acute encephalopathy, left sided weakness, and seizure like activity. LTM EEG negative for seizures. MRI 7/5 negative for acute abnormality. CXR 7/6 showing R medial and L basilar atelectasis.He was intubated on 7/4 and extubated on 7/7.  Subjective: cooperative, pleasant Assessment / Plan / Recommendation CHL IP CLINICAL IMPRESSIONS 03/23/2019 Clinical Impression Patient presents with a mild oropharyngeal dysphagia without penetration or aspiration and without  pharyngeal residuals. Orally, he had delays in mastication as well as anterior to posterior movement of boluses and brief holding of boluses. He exhibited delay in swallow initiation to level of vallecular sinus with all tested boluses. He was not able to transit a barirum tablet to back of oral cavity for swallow with puree solids or thin liquids. Overall, patient is at a mild risk of aspiration, but is safe to upgrade to Dys 3 thin liquids. SLP Visit Diagnosis Dysphagia, oropharyngeal phase (R13.12) Attention and concentration deficit following -- Frontal lobe and executive function deficit following -- Impact on safety and function Mild aspiration risk   CHL IP TREATMENT RECOMMENDATION 03/23/2019 Treatment Recommendations Therapy as outlined in treatment plan below   Prognosis 03/23/2019 Prognosis for Safe Diet Advancement Good Barriers to Reach Goals -- Barriers/Prognosis Comment -- CHL IP DIET RECOMMENDATION 03/23/2019 SLP Diet Recommendations Dysphagia 3 (Mech soft) solids;Thin liquid Liquid Administration via Cup;Straw Medication Administration Crushed with puree Compensations Minimize environmental distractions;Slow rate;Small sips/bites Postural Changes Seated upright at 90 degrees   CHL IP OTHER RECOMMENDATIONS 03/23/2019 Recommended Consults -- Oral Care Recommendations Oral care BID Other Recommendations --   CHL IP FOLLOW UP RECOMMENDATIONS 03/23/2019 Follow up Recommendations None   CHL IP FREQUENCY AND DURATION 03/23/2019 Speech Therapy Frequency (ACUTE ONLY) min 1 x/week Treatment  Duration 1 week      CHL IP ORAL PHASE 03/23/2019 Oral Phase Impaired Oral - Pudding Teaspoon -- Oral - Pudding Cup -- Oral - Honey Teaspoon -- Oral - Honey Cup -- Oral - Nectar Teaspoon -- Oral - Nectar Cup Reduced posterior propulsion;Premature spillage;Delayed oral transit Oral - Nectar Straw -- Oral - Thin Teaspoon -- Oral - Thin Cup Premature spillage;Delayed oral transit;Holding of bolus;Reduced posterior propulsion Oral -  Thin Straw -- Oral - Puree Reduced posterior propulsion;Delayed oral transit;Premature spillage Oral - Mech Soft -- Oral - Regular Impaired mastication;Delayed oral transit Oral - Multi-Consistency -- Oral - Pill Reduced posterior propulsion;Other (Comment) Oral Phase - Comment --  CHL IP PHARYNGEAL PHASE 03/23/2019 Pharyngeal Phase Impaired Pharyngeal- Pudding Teaspoon -- Pharyngeal -- Pharyngeal- Pudding Cup -- Pharyngeal -- Pharyngeal- Honey Teaspoon -- Pharyngeal -- Pharyngeal- Honey Cup -- Pharyngeal -- Pharyngeal- Nectar Teaspoon -- Pharyngeal -- Pharyngeal- Nectar Cup Delayed swallow initiation-vallecula Pharyngeal -- Pharyngeal- Nectar Straw -- Pharyngeal -- Pharyngeal- Thin Teaspoon -- Pharyngeal -- Pharyngeal- Thin Cup Delayed swallow initiation-vallecula Pharyngeal -- Pharyngeal- Thin Straw Delayed swallow initiation-vallecula Pharyngeal -- Pharyngeal- Puree Delayed swallow initiation-vallecula Pharyngeal -- Pharyngeal- Mechanical Soft -- Pharyngeal -- Pharyngeal- Regular Delayed swallow initiation-vallecula Pharyngeal -- Pharyngeal- Multi-consistency -- Pharyngeal -- Pharyngeal- Pill -- Pharyngeal -- Pharyngeal Comment --  CHL IP CERVICAL ESOPHAGEAL PHASE 03/23/2019 Cervical Esophageal Phase WFL Pudding Teaspoon -- Pudding Cup -- Honey Teaspoon -- Honey Cup -- Nectar Teaspoon -- Nectar Cup -- Nectar Straw -- Thin Teaspoon -- Thin Cup -- Thin Straw -- Puree -- Mechanical Soft -- Regular -- Multi-consistency -- Pill -- Cervical Esophageal Comment -- [name] 03/23/2019, 12:59 PM  Sonia Baller, MA, CCC-SLP Speech Therapy [facility] Acute Rehab Pager: [phone]              Scheduled Meds:  chlorhexidine gluconate (MEDLINE KIT)  15 mL Mouth Rinse BID   Chlorhexidine Gluconate Cloth  6 each Topical Q0600   feeding supplement (ENSURE ENLIVE)  237 mL Oral TID BM   folic acid  1 mg Per Tube Daily   LORazepam  0-4 mg Intravenous Q12H   multivitamin with minerals  1 tablet Oral Daily    thiamine  100 mg Oral Daily   Continuous Infusions:  sodium chloride       LOS: 6 days   Assessment & Plan:   Active Problems:   Acute encephalopathy   Episode of unresponsiveness/acute encephalopathy and possible cardiac arrest: CODE BLUE was run in the field.  He was intubated by PCCM and subsequently was extubated.  He is completely alert and oriented at this point in time with no symptoms.  Hypokalemia: Resolved.  Hypertension: History of hypertension but does not seem to be on any medications.  Blood pressure has now started to climb.  Will place him on PRN hydralazine.  Alcohol abuse: No signs of withdrawal at this point in time.  Continue PRN Ativan in case as needed.  Dysphagia: SLP on board.  Status post barium swallow.  On dysphagia diet 3.  This is likely due to intubation.  Generalized deconditioning: Evaluated by PT OT and they recommend SNF.  Due to being Medicaid, per case manager, it will take few days to arrange a bed for him.  DVT prophylaxis: Lovenox Code Status: Full code Family Communication: None present at bedside. Disposition Plan: Likely to SNF when bed available.   Time spent: 25 minutes   Darliss Cheney, MD [facility] Pager [phone]  If 7PM-7AM, please  contact night-coverage [email or web address] Password [email or web address] 03/24/2019, 1:31 PM

## 2019-03-25 NOTE — Progress Notes (Signed)
PROGRESS NOTE    [name]  QXI:[id number] DOB: 02-21-1956 DOA: 03/17/2019 PCP: Rogers Blocker, MD   Brief Narrative:  63 year old gentleman with a history of cocaine, tobacco and alcohol use, gout, hypertension, critical limb ischemia status post left BKA, remote subdural hematoma was brought into ED with acute encephalopathy as he was found down in the urine and less responsive.  Code stroke was done on him in the field., left-sided weakness and seizure-like activity.  He was admitted under PCCM, intubated for airway protection and extubated.  LTM EEG negative for seizure.  He was transferred under [facility] service starting 17 2020.  He was evaluated by PT OT and they recommended a skilled nursing facility however due to Medicaid, it is going to take time.  He was agreeable to go to SNF up until yesterday but today he wants to go home.  Consultants:   None  Procedures:   Intubation  Antimicrobials:   None   Subjective: Patient seen and examined.  He is completely alert and oriented like he was yesterday.  No complaints.  Now he tells me that he does not want to go to rehab and instead wants to go home but not today and instead tomorrow.  He is competent and he can make any decisions he want.  Objective: Vitals:   03/24/19 1255 03/24/19 1742 03/24/19 2121 03/25/19 0523  BP: (!) 177/102 (!) 152/90 (!) 176/110 (!) 127/98  Pulse: 96  (!) 105 90  Resp: _0 Temp: 99 F (37.2 C)  98.5 F (36.9 C) 98.2 F (36.8 C)  TempSrc: Oral  Oral Oral  SpO2: 99%  99% 99%  Weight:      Height:        Intake/Output Summary (Last 24 hours) at 03/25/2019 1255 Last data filed at 03/25/2019 1205 Gross per 24 hour  Intake 480 ml  Output 500 ml  Net -20 ml   Filed Weights   03/18/19 0248 03/19/19 0500 03/22/19 0500  Weight: 67.7 kg 71.2 kg 66.3 kg    Examination: General exam: Appears calm and comfortable  Respiratory system: Clear to auscultation. Respiratory effort normal.  Cardiovascular system: S1 & S2 heard, RRR. No JVD, murmurs, rubs, gallops or clicks. No pedal edema. Gastrointestinal system: Abdomen is nondistended, soft and nontender. No organomegaly or masses felt. Normal bowel sounds heard. Central nervous system: Alert and oriented. No focal neurological deficits. Extremities: Left BKA Skin: No rashes, lesions or ulcers Psychiatry: Judgement and insight appear normal. Mood & affect appropriate.   Data Reviewed: I have personally reviewed following labs and imaging studies  CBC: Recent Labs  Lab 03/19/19 0628 03/20/19 0437 03/21/19 0507 03/24/19 0407  WBC 8.2 7.0 7.1 7.2  NEUTROABS  --   --   --  3.9  HGB 10.3* 10.2* 11.5* 12.1*  HCT 29.3* 29.3* 33.6* 35.5*  MCV 97.0 99.0 97.7 98.9  PLT 238 238 284 034   Basic Metabolic Panel: Recent Labs  Lab 03/19/19 0628 03/20/19 0437 03/21/19 0507 03/21/19 1944 03/22/19 0453 03/23/19 0215 03/24/19 0407  NA 131* 135 135 136 137  --  140  K 3.4* 3.2* 2.6* 3.9 3.7  --  3.9  CL 99 103 98 101 102  --  105  CO2 _1 --  23  GLUCOSE 102* 118* 128* 111* 94  --  99  BUN 6* 8 <5* <5* <5*  --  12  CREATININE 0.62 0.61 0.58* 0.61 0.61  --  0.66  CALCIUM 8.2* 8.5* 8.7* 9.0 8.9  --  9.2  MG 2.0 1.9  --   --   --   --  1.8  PHOS 2.9 3.2 2.7  --  3.8 3.5  --    GFR: Estimated Creatinine Clearance: 81.2 mL/min (by C-G formula based on SCr of 0.66 mg/dL). Liver Function Tests: Recent Labs  Lab 03/24/19 0407  AST 55*  ALT 35  ALKPHOS 49  BILITOT 0.6  PROT 6.9  ALBUMIN 3.4*   No results for input(s): LIPASE, AMYLASE in the last 168 hours. No results for input(s): AMMONIA in the last 168 hours. Coagulation Profile: No results for input(s): INR, PROTIME in the last 168 hours. Cardiac Enzymes: No results for input(s): CKTOTAL, CKMB, CKMBINDEX, TROPONINI in the last 168 hours. BNP (last 3 results) No results for input(s): PROBNP in the last 8760 hours. HbA1C: No results for input(s):  HGBA1C in the last 72 hours. CBG: Recent Labs  Lab 03/21/19 2348 03/22/19 0346 03/22/19 0800 03/22/19 1230 03/22/19 1601  GLUCAP 101* 82 72 99 111*   Lipid Profile: No results for input(s): CHOL, HDL, LDLCALC, TRIG, CHOLHDL, LDLDIRECT in the last 72 hours. Thyroid Function Tests: No results for input(s): TSH, T4TOTAL, FREET4, T3FREE, THYROIDAB in the last 72 hours. Anemia Panel: No results for input(s): VITAMINB12, FOLATE, FERRITIN, TIBC, IRON, RETICCTPCT in the last 72 hours. Sepsis Labs: No results for input(s): PROCALCITON, LATICACIDVEN in the last 168 hours.  Recent Results (from the past 240 hour(s))  SARS Coronavirus 2 (CEPHEID - Performed in [facility] hospital lab), Hosp Order     Status: None   Collection Time: 03/18/19 12:09 AM   Specimen: Nasopharyngeal Swab  Result Value Ref Range Status   SARS Coronavirus 2 NEGATIVE NEGATIVE Final    Comment: (NOTE) If result is NEGATIVE SARS-CoV-2 target nucleic acids are NOT DETECTED. The SARS-CoV-2 RNA is generally detectable in upper and lower  respiratory specimens during the acute phase of infection. The lowest  concentration of SARS-CoV-2 viral copies this assay can detect is 250  copies / mL. A negative result does not preclude SARS-CoV-2 infection  and should not be used as the sole basis for treatment or other  patient management decisions.  A negative result may occur with  improper specimen collection / handling, submission of specimen other  than nasopharyngeal swab, presence of viral mutation(s) within the  areas targeted by this assay, and inadequate number of viral copies  (<250 copies / mL). A negative result must be combined with clinical  observations, patient history, and epidemiological information. If result is POSITIVE SARS-CoV-2 target nucleic acids are DETECTED. The SARS-CoV-2 RNA is generally detectable in upper and lower  respiratory specimens dur ing the acute phase of infection.  Positive   results are indicative of active infection with SARS-CoV-2.  Clinical  correlation with patient history and other diagnostic information is  necessary to determine patient infection status.  Positive results do  not rule out bacterial infection or co-infection with other viruses. If result is PRESUMPTIVE POSTIVE SARS-CoV-2 nucleic acids MAY BE PRESENT.   A presumptive positive result was obtained on the submitted specimen  and confirmed on repeat testing.  While 2019 novel coronavirus  (SARS-CoV-2) nucleic acids may be present in the submitted sample  additional confirmatory testing may be necessary for epidemiological  and / or clinical management purposes  to differentiate between  SARS-CoV-2 and other Sarbecovirus currently known to infect humans.  If clinically indicated additional testing  with an alternate test  methodology [phone]) is advised. The SARS-CoV-2 RNA is generally  detectable in upper and lower respiratory sp ecimens during the acute  phase of infection. The expected result is Negative. Fact Sheet for Patients:  StrictlyIdeas.no Fact Sheet for Healthcare Providers: BankingDealers.co.za This test is not yet approved or cleared by the [place] FDA and has been authorized for detection and/or diagnosis of SARS-CoV-2 by FDA under an Emergency Use Authorization (EUA).  This EUA will remain in effect (meaning this test can be used) for the duration of the COVID-19 declaration under Section 564(b)(1) of the Act, 21 U.S.C. section 360bbb-3(b)(1), unless the authorization is terminated or revoked sooner. Performed at [facility], [place] [address], [place], [place] [address]   MRSA PCR Screening     Status: None   Collection Time: 03/18/19  2:54 AM   Specimen: Nasal Mucosa; Nasopharyngeal  Result Value Ref Range Status   MRSA by PCR NEGATIVE NEGATIVE Final    Comment:        The GeneXpert MRSA Assay (FDA approved  for NASAL specimens only), is one component of a comprehensive MRSA colonization surveillance program. It is not intended to diagnose MRSA infection nor to guide or monitor treatment for MRSA infections. Performed at [facility], [place] [address] [place], [place] [address]   Culture, blood (routine x 2)     Status: None   Collection Time: 03/18/19  4:21 AM   Specimen: BLOOD  Result Value Ref Range Status   Specimen Description BLOOD RIGHT ANTECUBITAL  Final   Special Requests   Final    BOTTLES DRAWN AEROBIC AND ANAEROBIC Blood Culture adequate volume   Culture   Final    NO GROWTH 5 DAYS Performed at [facility], [place] [address] [place], [place] [address]    Report Status 03/23/2019 FINAL  Final  Culture, respiratory (tracheal aspirate)     Status: None   Collection Time: 03/18/19  5:55 AM   Specimen: Tracheal Aspirate; Respiratory  Result Value Ref Range Status   Specimen Description TRACHEAL ASPIRATE  Final   Special Requests NONE  Final   Gram Stain   Final    ABUNDANT WBC PRESENT, PREDOMINANTLY PMN MODERATE GRAM POSITIVE COCCI RARE GRAM NEGATIVE RODS Performed at [facility], [place] [address], [place], [place] [address]    Culture FEW STREPTOCOCCUS PNEUMONIAE  Final   Report Status 03/21/2019 FINAL  Final   Organism ID, Bacteria STREPTOCOCCUS PNEUMONIAE  Final      Susceptibility   Streptococcus pneumoniae - MIC*    ERYTHROMYCIN >=8 RESISTANT Resistant     LEVOFLOXACIN 2 SENSITIVE Sensitive     VANCOMYCIN 0.5 SENSITIVE Sensitive     PENICILLIN (non-meningitis) 1 SENSITIVE Sensitive     CEFTRIAXONE (non-meningitis) 0.5 SENSITIVE Sensitive     * FEW STREPTOCOCCUS PNEUMONIAE  Culture, blood (routine x 2)     Status: None   Collection Time: 03/18/19  6:31 AM   Specimen: BLOOD LEFT HAND  Result Value Ref Range Status   Specimen Description BLOOD LEFT HAND  Final   Special Requests   Final    BOTTLES DRAWN AEROBIC ONLY Blood Culture results may not  be optimal due to an inadequate volume of blood received in culture bottles   Culture   Final    NO GROWTH 5 DAYS Performed at [facility], [address] [place], [place] [address]    Report Status 03/23/2019 FINAL  Final      Radiology Studies: No results found.  Scheduled Meds: . chlorhexidine gluconate (MEDLINE KIT)  15 mL Mouth Rinse BID  . Chlorhexidine Gluconate Cloth  6 each Topical Q0600  . feeding supplement (ENSURE ENLIVE)  237 mL Oral TID BM  . folic acid  1 mg Per Tube Daily  . multivitamin with minerals  1 tablet Oral Daily  . thiamine  100 mg Oral Daily   Continuous Infusions: . sodium chloride       LOS: 7 days   Assessment & Plan:   Active Problems:   Acute encephalopathy   Episode of unresponsiveness/acute encephalopathy and possible cardiac arrest: CODE BLUE was run in the field.  He was intubated by PCCM and subsequently was extubated.  He is completely alert and oriented at this point in time with no symptoms for the last 2 days.  Hypokalemia: Resolved.  Hypertension: History of hypertension but does not seem to be on any medications.  Blood pressure fairly controlled.  Continue PRN hydralazine.  Alcohol abuse: No signs of withdrawal at this point in time.  Continue PRN Ativan in case as needed.  Dysphagia: SLP on board.  Status post barium swallow.  On dysphagia diet 3.  This is likely due to intubation.  Generalized deconditioning: Evaluated by PT OT and they recommend SNF.  Due to being Medicaid, per case manager, it will take few days to arrange a bed for him.  Patient was agreeable to go to SNF up until yesterday but now he wants to go home tomorrow.  He is competent to make decisions.  Also, he has pulled out his IV line and does not want IV placed again.  DVT prophylaxis: Lovenox Code Status: Full code Family Communication: None present at bedside. Disposition Plan: Likely to SNF when bed available or home tomorrow.   Time spent:  26 minutes   Darliss Cheney, MD [facility] Pager [phone]  If 7PM-7AM, please contact night-coverage [email or web address] Password [facility] 03/25/2019, 12:55 PM

## 2019-03-25 NOTE — Progress Notes (Signed)
   03/24/19 2121  Vitals  Temp 98.5 F (36.9 C)  Temp Source Oral  BP (!) 176/110 (notified the nurse)  MAP (mmHg) 129  BP Location Left Arm  BP Method Automatic  Patient Position (if appropriate) Lying  Pulse Rate (!) 105 (notified the nurse)  Resp 18  Oxygen Therapy  SpO2 99 %  O2 Device Room Air  MEWS Score  MEWS RR 0  MEWS Pulse 1  MEWS Systolic 0  MEWS LOC 0  MEWS Temp 0  MEWS Score 1  MEWS Score Color Green    Patient refused PRN Hydralazine.  Pt with elevated BP(176/110). Pt stated he is aware that the Blood pressure was elevated but wasn't something new . Education provided

## 2019-03-25 NOTE — Progress Notes (Signed)
Pt states that he has talked to his brother and sisters today and does not want me to call to give them daily update.

## 2019-03-25 NOTE — Progress Notes (Signed)
Pt pulled out IV. MD notified. Dr. Doristine Bosworth ok with leaving IV out.

## 2019-03-26 DIAGNOSIS — R1310 Dysphagia, unspecified: Secondary | ICD-10-CM

## 2019-03-26 DIAGNOSIS — J9601 Acute respiratory failure with hypoxia: Secondary | ICD-10-CM | POA: Diagnosis present

## 2019-03-26 DIAGNOSIS — E871 Hypo-osmolality and hyponatremia: Secondary | ICD-10-CM | POA: Diagnosis present

## 2019-03-26 DIAGNOSIS — R4182 Altered mental status, unspecified: Secondary | ICD-10-CM | POA: Diagnosis present

## 2019-03-26 MED ORDER — LISINOPRIL 10 MG PO TABS: 10.0000 mg | ORAL_TABLET | Freq: Every day | ORAL | 11 refills | Status: DC

## 2019-03-26 MED ORDER — AMLODIPINE BESYLATE 5 MG PO TABS: 5.0000 mg | ORAL_TABLET | Freq: Every day | ORAL | 0 refills | Status: DC

## 2019-03-26 NOTE — Progress Notes (Signed)
Physical Therapy Treatment Patient Details Name: Ronald [name] MRN: [id number] DOB: 09-13-1956 Today's Date: 03/26/2019    History of Present Illness Pt is a 63 y.o. M with significant PMH of cocaine, tobacco and alcohol use, gout, hypertension, critical limb iscehmia s/p left BKA, remote subdural hematoma who presents with acute encephalopathy, left sided weakness, and seizure like activity. LTM EEG negative for seizures. MRI 7/5 negative for acute abnormality. CXR 7/6 showing R medial and L basilar atelectasis.    PT Comments    Patient received in bed, alert, oriented, agrees to get up to recliner. Patient performed supine LE exercises then performed supine to sit independently with min use of rail. With set up assistance, patient transferred bed to recliner via pivot transfer with supervision only. Patient appears to be at baseline level of function as he was not ambulatory prior to admission.       Follow Up Recommendations  Supervision/Assistance - 24 hour     Equipment Recommendations  None recommended by PT    Recommendations for Other Services       Precautions / Restrictions Precautions Precautions: Fall Precaution Comments: left BKA Restrictions Weight Bearing Restrictions: No    Mobility  Bed Mobility Overal bed mobility: Modified Independent Bed Mobility: Supine to Sit     Supine to sit: Supervision     General bed mobility comments: No assistance other than use of bed rail to perform supine to sit  Transfers Overall transfer level: Modified independent Equipment used: None             General transfer comment: Able to stand pivot from bed to recliner with set up and supervision only. no physical assist needed.  Ambulation/Gait             General Gait Details: patient not ambulatory at baseline   Stairs             Wheelchair Mobility    Modified Rankin (Stroke Patients Only)       Balance Overall balance assessment:  Independent Sitting-balance support: Single extremity supported Sitting balance-Leahy Scale: Good                                      Cognition Arousal/Alertness: Awake/alert Behavior During Therapy: WFL for tasks assessed/performed Overall Cognitive Status: Within Functional Limits for tasks assessed                         Following Commands: Follows one step commands consistently       General Comments: Patient appropriate, alert, oriented, able to follow direction without difficulty      Exercises Total Joint Exercises Ankle Circles/Pumps: AROM;5 reps;Right Heel Slides: AROM;Right;10 reps    General Comments        Pertinent Vitals/Pain Pain Assessment: No/denies pain    Home Living Family/patient expects to be discharged to:: Private residence Living Arrangements: Other (Comment) Available Help at Discharge: Friend(s);Available PRN/intermittently Type of Home: House Home Access: Ramped entrance   Home Layout: One level Home Equipment: Wheelchair - manual      Prior Function Level of Independence: Independent with assistive device(s);Needs assistance  Gait / Transfers Assistance Needed: modI with wheelchair ADL's / Homemaking Assistance Needed: Pt states he is independent with ADL's, reports "Nae Nae," cooks for him and his brother drives him places     PT Goals (current goals can now be found  in the care plan section) Acute Rehab PT Goals Patient Stated Goal: to go home PT Goal Formulation: With patient Time For Goal Achievement: 04/04/19 Potential to Achieve Goals: Good Progress towards PT goals: Progressing toward goals    Frequency    Min 2X/week      PT Plan      Co-evaluation              AM-PAC PT "6 Clicks" Mobility   Outcome Measure  Help needed turning from your back to your side while in a flat bed without using bedrails?: None Help needed moving from lying on your back to sitting on the side of a  flat bed without using bedrails?: None Help needed moving to and from a bed to a chair (including a wheelchair)?: A Little Help needed standing up from a chair using your arms (e.g., wheelchair or bedside chair)?: None Help needed to walk in hospital room?: Total Help needed climbing 3-5 steps with a railing? : Total 6 Click Score: 17    End of Session   Activity Tolerance: Patient tolerated treatment well Patient left: in chair;with chair alarm set Nurse Communication: Mobility status PT Visit Diagnosis: Muscle weakness (generalized) (M62.81)     Time: [id number] PT Time Calculation (min) (ACUTE ONLY): 8 min  Charges:  $Therapeutic Activity: 8-22 mins                     Ronald [name], PT, GCS 03/26/19,12:56 PM

## 2019-03-26 NOTE — Discharge Instructions (Signed)

## 2019-03-26 NOTE — Discharge Summary (Signed)
Physician Discharge Summary  [facility] ZOX:[id number] DOB: 24-Aug-1956 DOA: 03/17/2019  PCP: Gwenyth Bender, MD  Admit date: 03/17/2019 Discharge date: 03/26/2019  Admitted From: Home Disposition: Home with home health  Recommendations for Outpatient Follow-up:  1. Follow up with PCP in 1-2 weeks 2. Please obtain BMP/CBC in one week 3. Please follow up on the following pending results:  Home Health: Yes Equipment/Devices:commode   Discharge Condition: Stable. CODE STATUS: Full code Diet recommendation: Dysphagia 3 diet  Subjective: Patient seen and examined.  He is completely alert and oriented.  He has no complaints at all.  He is still is adamant to go home.  I did explain to him one more time.physical therapy and the medical team recommends that he goes to skilled nursing facility.  He verbalized understanding and also verbalized being responsible for any risks or complications of going home against our recommendations of going to skilled nursing facility.  Brief/Interim Summary: 63 year old gentleman with a history of cocaine, tobacco and alcohol use, gout, hypertension, critical limb ischemia status post left BKA, remote subdural hematoma was brought into ED with acute encephalopathy as he was found down in the urine and less responsive.  Code stroke was done on him in the field.,he had  left-sided weakness and seizure-like activity.  He was admitted under PCCM, intubated for airway protection and subsequently extubated.  LTM EEG negative for seizure.    He also was admitted with severe hyponatremia of sodium 120 as well as hypokalemia.  He has hypokalemia and hyponatremia resolved.  He was transferred under [facility] service starting 17 2020.  He was evaluated by PT OT and they recommended a skilled nursing facility however due to Medicaid, it was going to take time however over case managers had already started working on this last week but over the weekend, patient expressed his desires that  he would like to go home against my recommendations of going to nursing facility.  Patient has remained alert, oriented and competent through the past 3 days that I have been seeing him so he is competent to make any decisions for him and based on his discharge, he is going to be discharged home.  Home health is going to be set up for him.  For his blood pressure, he was off of the triamterene hydrochlorothiazide and due to his hyponatremia, I am going to discontinue that and instead going to place him on amlodipine 5 mg along with lisinopril 10 mg.  Discharge Diagnoses:  Active Problems:   HTN (hypertension), benign   Acute encephalopathy   Hyponatremia   Altered mental status   Acute hypoxemic respiratory failure [facility])   Dysphagia    Discharge Instructions  Discharge Instructions    Discharge patient   Complete by: As directed    Discharge disposition: 06-Home-Health Care Svc   Discharge patient date: 03/26/2019     Allergies as of 03/26/2019   No Known Allergies     Medication List    STOP taking these medications   ibuprofen 200 MG tablet Commonly known as: ADVIL   triamterene-hydrochlorothiazide 37.5-25 MG tablet Commonly known as: MAXZIDE-25     TAKE these medications   amLODipine 5 MG tablet Commonly known as: NORVASC Take 1 tablet (5 mg total) by mouth daily.   folic acid 1 MG tablet Commonly known as: FOLVITE Take 1 tablet (1 mg total) by mouth daily.   lisinopril 10 MG tablet Commonly known as: ZESTRIL Take 1 tablet (10 mg total) by mouth daily.  multivitamin with minerals Tabs tablet Take 1 tablet by mouth daily.   thiamine 100 MG tablet Take 1 tablet (100 mg total) by mouth daily.      Follow-up Information    Gwenyth Bender, MD Follow up in 1 week(s).   Specialty: Internal Medicine Contact information: [address] [place] [address] [phone]          No Known Allergies  Consultations: none. admitted by  PCCM   Procedures/Studies: Ct Angio Head W Or Wo Contrast  Result Date: 03/17/2019 CLINICAL DATA:  63 y/o  M; stroke follow-up. Left-sided weakness. EXAM: CT ANGIOGRAPHY HEAD AND NECK CT PERFUSION BRAIN TECHNIQUE: Multidetector CT imaging of the head and neck was performed using the standard protocol during bolus administration of intravenous contrast. Multiplanar CT image reconstructions and MIPs were obtained to evaluate the vascular anatomy. Carotid stenosis measurements (when applicable) are obtained utilizing NASCET criteria, using the distal internal carotid diameter as the denominator. Multiphase CT imaging of the brain was performed following IV bolus contrast injection. Subsequent parametric perfusion maps were calculated using RAPID software. CONTRAST:  115 cc Omnipaque 350 COMPARISON:  03/17/2019 CT head. FINDINGS: CTA NECK FINDINGS Aortic arch: Standard branching. Imaged portion shows no evidence of aneurysm or dissection. No significant stenosis of the major arch vessel origins. Mild calcific atherosclerosis. Right carotid system: No evidence of dissection, stenosis (50% or greater) or occlusion. Mild non stenotic calcific atherosclerosis of carotid bifurcation. Left carotid system: No evidence of dissection, stenosis (50% or greater) or occlusion. Mild non stenotic calcific atherosclerosis of carotid bifurcation. Vertebral arteries: Left dominant. No evidence of dissection, stenosis (50% or greater) or occlusion. Skeleton: No acute finding. Other neck: Negative. Upper chest: Centrilobular emphysema. Review of the MIP images confirms the above findings CTA HEAD FINDINGS Anterior circulation: No significant stenosis, proximal occlusion, or aneurysm. Mild non stenotic calcific atherosclerosis of internal carotid arteries. There is a prominent cluster of vessels overlying the right sylvian fissure spanning 20 x 7 x 7 mm (AP by ML series 10, image 16 and series 9, image 41) as well as mildly increased  enhancement of the right MCA distribution in comparison with the left. Posterior circulation: No significant stenosis, proximal occlusion, aneurysm, or vascular malformation. Left dominant vertebrobasilar system. Mild non stenotic calcific atherosclerosis of basilar artery and vertebral artery. Venous sinuses: As permitted by contrast timing, patent. Anatomic variants: None significant. Review of the MIP images confirms the above findings CT Brain Perfusion Findings: ASPECTS: 10 CBF (<30%) Volume: 0mL Perfusion (Tmax>6.0s) volume: 0mL Mismatch Volume: 0mL Infarction Location:Not applicable. Motion artifact. IMPRESSION: CT brain perfusion: 1. Negative CT brain perfusion.  Mild motion artifact. CTA neck: 1. Patent carotid and vertebral arteries. No dissection, aneurysm, or hemodynamically significant stenosis utilizing NASCET criteria. 2. Centrilobular emphysema of lung apices. 3. Mild calcific atherosclerosis of aortic arch, carotid bifurcations, and vertebral arteries. CTA head: 1. Patent anterior and posterior intracranial circulation. No large vessel occlusion, aneurysm, or significant stenosis. 2. 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 These results were called by telephone at the time of interpretation on 03/17/2019 at 11:11 pm to Dr. Milon Dikes , who verbally acknowledged these results. Electronically Signed   By: Mitzi Hansen M.D.   On: 03/17/2019 23:19   Dg Abd 1 View  Result Date: 03/18/2019 CLINICAL DATA:  Orogastric tube placement. EXAM: ABDOMEN - 1 VIEW COMPARISON:  None. FINDINGS: The bowel  gas pattern is normal. Distal tip of nasogastric tube is seen in proximal stomach. Possible small bilateral renal calculi are noted. IMPRESSION: Distal tip of nasogastric tube seen in proximal stomach. Possible bilateral nephrolithiasis. No evidence of bowel obstruction or  ileus. Electronically Signed   By: Lupita Raider M.D.   On: 03/18/2019 10:05   Ct Angio Neck W Or Wo Contrast  Result Date: 03/17/2019 CLINICAL DATA:  63 y/o  M; stroke follow-up. Left-sided weakness. EXAM: CT ANGIOGRAPHY HEAD AND NECK CT PERFUSION BRAIN TECHNIQUE: Multidetector CT imaging of the head and neck was performed using the standard protocol during bolus administration of intravenous contrast. Multiplanar CT image reconstructions and MIPs were obtained to evaluate the vascular anatomy. Carotid stenosis measurements (when applicable) are obtained utilizing NASCET criteria, using the distal internal carotid diameter as the denominator. Multiphase CT imaging of the brain was performed following IV bolus contrast injection. Subsequent parametric perfusion maps were calculated using RAPID software. CONTRAST:  115 cc Omnipaque 350 COMPARISON:  03/17/2019 CT head. FINDINGS: CTA NECK FINDINGS Aortic arch: Standard branching. Imaged portion shows no evidence of aneurysm or dissection. No significant stenosis of the major arch vessel origins. Mild calcific atherosclerosis. Right carotid system: No evidence of dissection, stenosis (50% or greater) or occlusion. Mild non stenotic calcific atherosclerosis of carotid bifurcation. Left carotid system: No evidence of dissection, stenosis (50% or greater) or occlusion. Mild non stenotic calcific atherosclerosis of carotid bifurcation. Vertebral arteries: Left dominant. No evidence of dissection, stenosis (50% or greater) or occlusion. Skeleton: No acute finding. Other neck: Negative. Upper chest: Centrilobular emphysema. Review of the MIP images confirms the above findings CTA HEAD FINDINGS Anterior circulation: No significant stenosis, proximal occlusion, or aneurysm. Mild non stenotic calcific atherosclerosis of internal carotid arteries. There is a prominent cluster of vessels overlying the right sylvian fissure spanning 20 x 7 x 7 mm (AP by ML series 10, image 16  and series 9, image 41) as well as mildly increased enhancement of the right MCA distribution in comparison with the left. Posterior circulation: No significant stenosis, proximal occlusion, aneurysm, or vascular malformation. Left dominant vertebrobasilar system. Mild non stenotic calcific atherosclerosis of basilar artery and vertebral artery. Venous sinuses: As permitted by contrast timing, patent. Anatomic variants: None significant. Review of the MIP images confirms the above findings CT Brain Perfusion Findings: ASPECTS: 10 CBF (<30%) Volume: 0mL Perfusion (Tmax>6.0s) volume: 0mL Mismatch Volume: 0mL Infarction Location:Not applicable. Motion artifact. IMPRESSION: CT brain perfusion: 1. Negative CT brain perfusion.  Mild motion artifact. CTA neck: 1. Patent carotid and vertebral arteries. No dissection, aneurysm, or hemodynamically significant stenosis utilizing NASCET criteria. 2. Centrilobular emphysema of lung apices. 3. Mild calcific atherosclerosis of aortic arch, carotid bifurcations, and vertebral arteries. CTA head: 1. Patent anterior and posterior intracranial circulation. No large vessel occlusion, aneurysm, or significant stenosis. 2. 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 These results were called by telephone at the time of interpretation on 03/17/2019 at 11:11 pm to Dr. Milon Dikes , who verbally acknowledged these results. Electronically Signed   By: Mitzi Hansen M.D.   On: 03/17/2019 23:19   Mr Brain Wo Contrast  Result Date: 03/18/2019 CLINICAL DATA:  63 y/o  M; in cephalopathy. EXAM: MRI HEAD WITHOUT CONTRAST TECHNIQUE: Multiplanar, multiecho pulse sequences of the brain and surrounding structures were obtained without intravenous contrast. COMPARISON:  03/17/2019 and 06/19/2014 CT head. FINDINGS: Brain: No  acute infarction, hemorrhage,  hydrocephalus, extra-axial collection or mass lesion. 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 Progression of early confluent nonspecific T2 FLAIR hyperintensities in subcortical and periventricular white matter compatible with moderate chronic microvascular ischemic changes. Progression of moderate volume loss of the brain. There are a few punctate foci of susceptibility hypointensity within the basal ganglia and brainstem compatible with hemosiderin deposition of chronic microhemorrhage, probably secondary to chronic hypertension given distribution. Vascular: Normal flow voids. Skull and upper cervical spine: Normal marrow signal. Sinuses/Orbits: Mild mucosal thickening of the ethmoid air cells and maxillary sinuses. Debris within the nasopharynx. Partial opacification of mastoid air cells. Orbits are unremarkable. Other: None. IMPRESSION: 1. No acute intracranial abnormality identified. 2. Progression of moderate chronic microvascular ischemic changes and volume loss of the brain from 2015. Chronic lacunar infarcts in the basal ganglia bilaterally. Electronically Signed   By: Mitzi Hansen M.D.   On: 03/18/2019 02:52   Ct Cerebral Perfusion W Contrast  Result Date: 03/17/2019 CLINICAL DATA:  63 y/o  M; stroke follow-up. Left-sided weakness. EXAM: CT ANGIOGRAPHY HEAD AND NECK CT PERFUSION BRAIN TECHNIQUE: Multidetector CT imaging of the head and neck was performed using the standard protocol during bolus administration of intravenous contrast. Multiplanar CT image reconstructions and MIPs were obtained to evaluate the vascular anatomy. Carotid stenosis measurements (when applicable) are obtained utilizing NASCET criteria, using the distal internal carotid diameter as the denominator. Multiphase CT imaging of the brain was performed following IV bolus contrast injection.  Subsequent parametric perfusion maps were calculated using RAPID software. CONTRAST:  115 cc Omnipaque 350 COMPARISON:  03/17/2019 CT head. FINDINGS: CTA NECK FINDINGS Aortic arch: Standard branching. Imaged portion shows no evidence of aneurysm or dissection. No significant stenosis of the major arch vessel origins. Mild calcific atherosclerosis. Right carotid system: No evidence of dissection, stenosis (50% or greater) or occlusion. Mild non stenotic calcific atherosclerosis of carotid bifurcation. Left carotid system: No evidence of dissection, stenosis (50% or greater) or occlusion. Mild non stenotic calcific atherosclerosis of carotid bifurcation. Vertebral arteries: Left dominant. No evidence of dissection, stenosis (50% or greater) or occlusion. Skeleton: No acute finding. Other neck: Negative. Upper chest: Centrilobular emphysema. Review of the MIP images confirms the above findings CTA HEAD FINDINGS Anterior circulation: No significant stenosis, proximal occlusion, or aneurysm. Mild non stenotic calcific atherosclerosis of internal carotid arteries. There is a prominent cluster of vessels overlying the right sylvian fissure spanning 20 x 7 x 7 mm (AP by ML series 10, image 16 and series 9, image 41) as well as mildly increased enhancement of the right MCA distribution in comparison with the left. Posterior circulation: No significant stenosis, proximal occlusion, aneurysm, or vascular malformation. Left dominant vertebrobasilar system. Mild non stenotic calcific atherosclerosis of basilar artery and vertebral artery. Venous sinuses: As permitted by contrast timing, patent. Anatomic variants: None significant. Review of the MIP images confirms the above findings CT Brain Perfusion Findings: ASPECTS: 10 CBF (<30%) Volume: 0mL Perfusion (Tmax>6.0s) volume: 0mL Mismatch Volume: 0mL Infarction Location:Not applicable. Motion artifact. IMPRESSION: CT brain perfusion: 1. Negative CT brain perfusion.  Mild motion  artifact. CTA neck: 1. Patent carotid and vertebral arteries. No dissection, aneurysm, or hemodynamically significant stenosis utilizing NASCET criteria. 2. Centrilobular emphysema of lung apices. 3. Mild calcific atherosclerosis of aortic arch, carotid bifurcations, and vertebral arteries. CTA head: 1. Patent anterior and posterior intracranial circulation. No large vessel  occlusion, aneurysm, or significant stenosis. 2. 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 These results were called by telephone at the time of interpretation on 03/17/2019 at 11:11 pm to Dr. Amie Portland , who verbally acknowledged these results. Electronically Signed   By: Kristine Garbe M.D.   On: 03/17/2019 23:19   Dg Chest Port 1 View  Result Date: 03/20/2019 CLINICAL DATA:  Respiratory failure. EXAM: PORTABLE CHEST 1 VIEW COMPARISON:  03/19/2019. FINDINGS: Right PICC line noted with tip over cavoatrial junction. Endotracheal tube, NG tube in stable position. Heart size stable. No pulmonary venous congestion. Mild medial right base atelectasis/infiltrate again cannot be excluded. Interim improvement from prior exam. No atelectasis noted the left lung base on today's exam. No pleural effusion or pneumothorax. IMPRESSION: 1. Right PICC line noted with tip over cavoatrial junction. Endotracheal tube and NG tube in stable position. 2. Mild medial right base atelectasis/infiltrate again cannot be excluded. Interim improvement in aeration in the lung bases from prior exam. Electronically Signed   By: [place]   On: 03/20/2019 07:29   Dg Chest Port 1 View  Result Date: 03/19/2019 CLINICAL DATA:  Respiratory failure. EXAM: PORTABLE CHEST 1 VIEW COMPARISON:  03/17/2019. FINDINGS: Tracheostomy tube in stable position. NG tube noted with tip in stomach. Heart size normal. Mild medial right base  infiltrate. Mild left base atelectasis/infiltrate. No pleural effusion or pneumothorax. IMPRESSION: 1.  Lines and tubes in good anatomic position. 2. Mild medial right base infiltrate. Mild left base atelectasis/infiltrate. Electronically Signed   By: Marcello Moores  Register   On: 03/19/2019 07:09   Dg Chest Portable 1 View  Result Date: 03/18/2019 CLINICAL DATA:  Intubated EXAM: PORTABLE CHEST 1 VIEW COMPARISON:  03/17/2019, 08/29/2017 FINDINGS: Endotracheal tube has been placed, the tip is about 3.3 cm superior to the carina. Esophageal tube tip below the diaphragm but incompletely imaged. No focal airspace disease or effusion. Normal heart size. No pneumothorax IMPRESSION: 1. Endotracheal tube tip about 3.3 cm superior to carina 2. Esophageal tube tip below the diaphragm but incompletely visualized Electronically Signed   By: Donavan Foil M.D.   On: 03/18/2019 00:26   Dg Chest Port 1 View  Result Date: 03/17/2019 CLINICAL DATA:  Aphasia.  Acute mental status change. EXAM: PORTABLE CHEST 1 VIEW COMPARISON:  August 29, 2017 FINDINGS: The heart size and mediastinal contours are within normal limits. Both lungs are clear. The visualized skeletal structures are unremarkable. IMPRESSION: No active disease. Electronically Signed   By: Dorise Bullion III M.D   On: 03/17/2019 23:26   Dg Swallowing Func-speech Pathology  Result Date: 03/23/2019 Objective Swallowing Evaluation: Type of Study: MBS-Modified Barium Swallow Study  Patient Details Name: [name] MRN: [id number] Date of Birth: [birth year] Today's Date: 03/23/2019 Time: SLP Start Time (ACUTE ONLY): 1130 -SLP Stop Time (ACUTE ONLY): 1145 SLP Time Calculation (min) (ACUTE ONLY): 15 min Past Medical History: Past Medical History: Diagnosis Date . Critical lower limb ischemia  . ETOH abuse  . Gout  . Hypertension  Past Surgical History: Past Surgical History: Procedure Laterality Date . ABDOMINAL AORTAGRAM N/A 12/26/2013  Procedure: ABDOMINAL Maxcine Ham;   Surgeon: Serafina Mitchell, MD;  Location: [facility] CATH LAB;  Service: Cardiovascular;  Laterality: N/A; . AMPUTATION Left 12/28/2013  Procedure: LEFT BELOW KNEE AMPUTATION;  Surgeon: Newt Minion, MD;  Location: [facility];  Service: Orthopedics;  Laterality: Left; . BELOW KNEE LEG AMPUTATION  Left . LEG SURGERY   HPI: Pt is a 64 y.o. M with significant PMH of cocaine, tobacco and alcohol use, gout, hypertension, critical limb iscehmia s/p left BKA, remote subdural hematoma who presents with acute encephalopathy, left sided weakness, and seizure like activity. LTM EEG negative for seizures. MRI 7/5 negative for acute abnormality. CXR 7/6 showing R medial and L basilar atelectasis.He was intubated on 7/4 and extubated on 7/7.  Subjective: cooperative, pleasant Assessment / Plan / Recommendation CHL IP CLINICAL IMPRESSIONS 03/23/2019 Clinical Impression Patient presents with a mild oropharyngeal dysphagia without penetration or aspiration and without pharyngeal residuals. Orally, he had delays in mastication as well as anterior to posterior movement of boluses and brief holding of boluses. He exhibited delay in swallow initiation to level of vallecular sinus with all tested boluses. He was not able to transit a barirum tablet to back of oral cavity for swallow with puree solids or thin liquids. Overall, patient is at a mild risk of aspiration, but is safe to upgrade to Dys 3 thin liquids. SLP Visit Diagnosis Dysphagia, oropharyngeal phase (R13.12) Attention and concentration deficit following -- Frontal lobe and executive function deficit following -- Impact on safety and function Mild aspiration risk   CHL IP TREATMENT RECOMMENDATION 03/23/2019 Treatment Recommendations Therapy as outlined in treatment plan below   Prognosis 03/23/2019 Prognosis for Safe Diet Advancement Good Barriers to Reach Goals -- Barriers/Prognosis Comment -- CHL IP DIET RECOMMENDATION 03/23/2019 SLP Diet Recommendations Dysphagia 3 (Mech soft) solids;Thin  liquid Liquid Administration via Cup;Straw Medication Administration Crushed with puree Compensations Minimize environmental distractions;Slow rate;Small sips/bites Postural Changes Seated upright at 90 degrees   CHL IP OTHER RECOMMENDATIONS 03/23/2019 Recommended Consults -- Oral Care Recommendations Oral care BID Other Recommendations --   CHL IP FOLLOW UP RECOMMENDATIONS 03/23/2019 Follow up Recommendations None   CHL IP FREQUENCY AND DURATION 03/23/2019 Speech Therapy Frequency (ACUTE ONLY) min 1 x/week Treatment Duration 1 week      CHL IP ORAL PHASE 03/23/2019 Oral Phase Impaired Oral - Pudding Teaspoon -- Oral - Pudding Cup -- Oral - Honey Teaspoon -- Oral - Honey Cup -- Oral - Nectar Teaspoon -- Oral - Nectar Cup Reduced posterior propulsion;Premature spillage;Delayed oral transit Oral - Nectar Straw -- Oral - Thin Teaspoon -- Oral - Thin Cup Premature spillage;Delayed oral transit;Holding of bolus;Reduced posterior propulsion Oral - Thin Straw -- Oral - Puree Reduced posterior propulsion;Delayed oral transit;Premature spillage Oral - Mech Soft -- Oral - Regular Impaired mastication;Delayed oral transit Oral - Multi-Consistency -- Oral - Pill Reduced posterior propulsion;Other (Comment) Oral Phase - Comment --  CHL IP PHARYNGEAL PHASE 03/23/2019 Pharyngeal Phase Impaired Pharyngeal- Pudding Teaspoon -- Pharyngeal -- Pharyngeal- Pudding Cup -- Pharyngeal -- Pharyngeal- Honey Teaspoon -- Pharyngeal -- Pharyngeal- Honey Cup -- Pharyngeal -- Pharyngeal- Nectar Teaspoon -- Pharyngeal -- Pharyngeal- Nectar Cup Delayed swallow initiation-vallecula Pharyngeal -- Pharyngeal- Nectar Straw -- Pharyngeal -- Pharyngeal- Thin Teaspoon -- Pharyngeal -- Pharyngeal- Thin Cup Delayed swallow initiation-vallecula Pharyngeal -- Pharyngeal- Thin Straw Delayed swallow initiation-vallecula Pharyngeal -- Pharyngeal- Puree Delayed swallow initiation-vallecula Pharyngeal -- Pharyngeal- Mechanical Soft -- Pharyngeal -- Pharyngeal- Regular  Delayed swallow initiation-vallecula Pharyngeal -- Pharyngeal- Multi-consistency -- Pharyngeal -- Pharyngeal- Pill -- Pharyngeal -- Pharyngeal Comment --  CHL IP CERVICAL ESOPHAGEAL PHASE 03/23/2019 Cervical Esophageal Phase WFL Pudding Teaspoon -- Pudding Cup -- Honey Teaspoon -- Honey Cup -- Nectar Teaspoon -- Nectar Cup -- Nectar Straw -- Thin Teaspoon -- Thin Cup -- Thin Straw -- Puree -- Mechanical Soft -- Regular --  Multi-consistency -- Pill -- Cervical Esophageal Comment -- Pablo Lawrencereston, John Tarrell 03/23/2019, 12:59 PM  Angela NevinJohn T. Preston, MA, CCC-SLP Speech Therapy [facility] Pager: [phone]             Ct Head Code Stroke Wo Contrast  Result Date: 03/17/2019 CLINICAL DATA:  Code stroke. 63 year old male with left side weakness last seen normal 2200 hours. EXAM: CT HEAD WITHOUT CONTRAST TECHNIQUE: Contiguous axial images were obtained from the base of the skull through the vertex without intravenous contrast. COMPARISON:  Head CT 06/19/2014. FINDINGS: Study is intermittently degraded by motion artifact despite repeated imaging attempts. Brain: Generalized cerebral volume loss since 2015. Chronic bilateral basal ganglia infarcts appear stable. Progressed and now confluent bilateral cerebral white matter hypodensity. A small left thalamic lacunar infarct also appears stable. No midline shift, mass effect, or evidence of intracranial mass lesion. No acute intracranial hemorrhage identified. No cortically based acute infarct identified. Vascular: Calcified atherosclerosis at the skull base. No suspicious intracranial vascular hyperdensity. Skull: Motion artifact at the skull base. No acute osseous abnormality identified. Sinuses/Orbits: Paranasal sinuses and mastoids appear clear today. Other: No acute orbit or scalp soft tissue abnormality identified. ASPECTS [facility]([place] Stroke Program Early CT Score) Total score (0-10 with 10 being normal): 10 IMPRESSION: 1. Motion degraded exam with no acute cortically based  infarct or acute intracranial hemorrhage identified. 2. ASPECTS is 10. 3. Chronic small vessel disease with progression in the cerebral white matter since 2015. 4. These results were communicated to Dr. Wilford CornerArora at 10:41 pm on 7/4/2020by text page via the [facility] messaging system. Electronically Signed   By: Odessa FlemingH  Hall M.D.   On: 03/17/2019 22:41   [place] Ekg Site Rite  Result Date: 03/19/2019 If Site Rite image not attached, placement could not be confirmed due to current cardiac rhythm.    Discharge Exam: Vitals:   03/25/19 2056 03/26/19 0504  BP: (!) 169/98 (!) 162/89  Pulse: 96 86  Resp: 18 18  Temp: 98.6 F (37 C) 98.5 F (36.9 C)  SpO2: 100% 98%   Vitals:   03/24/19 2121 03/25/19 0523 03/25/19 2056 03/26/19 0504  BP: (!) 176/110 (!) 127/98 (!) 169/98 (!) 162/89  Pulse: (!) 105 90 96 86  Resp: 18 18 18 18   Temp: 98.5 F (36.9 C) 98.2 F (36.8 C) 98.6 F (37 C) 98.5 F (36.9 C)  TempSrc: Oral Oral Oral Oral  SpO2: 99% 99% 100% 98%  Weight:      Height:        General: Pt is alert, awake, not in acute distress Cardiovascular: RRR, S1/S2 +, no rubs, no gallops Respiratory: CTA bilaterally, no wheezing, no rhonchi Abdominal: Soft, NT, ND, bowel sounds + Extremities: no edema, no cyanosis, left BKA    The results of significant diagnostics from this hospitalization (including imaging, microbiology, ancillary and laboratory) are listed below for reference.     Microbiology: Recent Results (from the past 240 hour(s))  SARS Coronavirus 2 (CEPHEID - Performed in [facility] hospital lab), Hosp Order     Status: None   Collection Time: 03/18/19 12:09 AM   Specimen: Nasopharyngeal Swab  Result Value Ref Range Status   SARS Coronavirus 2 NEGATIVE NEGATIVE Final    Comment: (NOTE) If result is NEGATIVE SARS-CoV-2 target nucleic acids are NOT DETECTED. The SARS-CoV-2 RNA is generally detectable in upper and lower  respiratory specimens during the acute phase of infection. The  lowest  concentration of SARS-CoV-2 viral copies this assay can detect is 250  copies / mL. A negative result does not preclude SARS-CoV-2 infection  and should not be used as the sole basis for treatment or other  patient management decisions.  A negative result may occur with  improper specimen collection / handling, submission of specimen other  than nasopharyngeal swab, presence of viral mutation(s) within the  areas targeted by this assay, and inadequate number of viral copies  (<250 copies / mL). A negative result must be combined with clinical  observations, patient history, and epidemiological information. If result is POSITIVE SARS-CoV-2 target nucleic acids are DETECTED. The SARS-CoV-2 RNA is generally detectable in upper and lower  respiratory specimens dur ing the acute phase of infection.  Positive  results are indicative of active infection with SARS-CoV-2.  Clinical  correlation with patient history and other diagnostic information is  necessary to determine patient infection status.  Positive results do  not rule out bacterial infection or co-infection with other viruses. If result is PRESUMPTIVE POSTIVE SARS-CoV-2 nucleic acids MAY BE PRESENT.   A presumptive positive result was obtained on the submitted specimen  and confirmed on repeat testing.  While 2019 novel coronavirus  (SARS-CoV-2) nucleic acids may be present in the submitted sample  additional confirmatory testing may be necessary for epidemiological  and / or clinical management purposes  to differentiate between  SARS-CoV-2 and other Sarbecovirus currently known to infect humans.  If clinically indicated additional testing with an alternate test  methodology [id number]) is advised. The SARS-CoV-2 RNA is generally  detectable in upper and lower respiratory sp ecimens during the acute  phase of infection. The expected result is Negative. Fact Sheet for Patients:   BoilerBrush.com.cy Fact Sheet for Healthcare Providers: https://pope.com/ This test is not yet approved or cleared by the [place] FDA and has been authorized for detection and/or diagnosis of SARS-CoV-2 by FDA under an Emergency Use Authorization (EUA).  This EUA will remain in effect (meaning this test can be used) for the duration of the COVID-19 declaration under Section 564(b)(1) of the Act, 21 U.S.C. section 360bbb-3(b)(1), unless the authorization is terminated or revoked sooner. Performed at [facility], [address] [place], [place] [address]   MRSA PCR Screening     Status: None   Collection Time: 03/18/19  2:54 AM   Specimen: Nasal Mucosa; Nasopharyngeal  Result Value Ref Range Status   MRSA by PCR NEGATIVE NEGATIVE Final    Comment:        The GeneXpert MRSA Assay (FDA approved for NASAL specimens only), is one component of a comprehensive MRSA colonization surveillance program. It is not intended to diagnose MRSA infection nor to guide or monitor treatment for MRSA infections. Performed at [facility], [address], [place], [place] [address]   Culture, blood (routine x 2)     Status: None   Collection Time: 03/18/19  4:21 AM   Specimen: BLOOD  Result Value Ref Range Status   Specimen Description BLOOD RIGHT ANTECUBITAL  Final   Special Requests   Final    BOTTLES DRAWN AEROBIC AND ANAEROBIC Blood Culture adequate volume   Culture   Final    NO GROWTH 5 DAYS Performed at [facility], [address], [place], [place] [address]    Report Status 03/23/2019 FINAL  Final  Culture, respiratory (tracheal aspirate)     Status: None   Collection Time: 03/18/19  5:55 AM   Specimen: Tracheal Aspirate; Respiratory  Result Value Ref Range Status  Specimen Description TRACHEAL ASPIRATE  Final   Special Requests NONE  Final   Gram Stain   Final    ABUNDANT WBC PRESENT, PREDOMINANTLY  PMN MODERATE GRAM POSITIVE COCCI RARE GRAM NEGATIVE RODS Performed at [facility], [address] [place], [place] [address]    Culture FEW STREPTOCOCCUS PNEUMONIAE  Final   Report Status 03/21/2019 FINAL  Final   Organism ID, Bacteria STREPTOCOCCUS PNEUMONIAE  Final      Susceptibility   Streptococcus pneumoniae - MIC*    ERYTHROMYCIN >=8 RESISTANT Resistant     LEVOFLOXACIN 2 SENSITIVE Sensitive     VANCOMYCIN 0.5 SENSITIVE Sensitive     PENICILLIN (non-meningitis) 1 SENSITIVE Sensitive     CEFTRIAXONE (non-meningitis) 0.5 SENSITIVE Sensitive     * FEW STREPTOCOCCUS PNEUMONIAE  Culture, blood (routine x 2)     Status: None   Collection Time: 03/18/19  6:31 AM   Specimen: BLOOD LEFT HAND  Result Value Ref Range Status   Specimen Description BLOOD LEFT HAND  Final   Special Requests   Final    BOTTLES DRAWN AEROBIC ONLY Blood Culture results may not be optimal due to an inadequate volume of blood received in culture bottles   Culture   Final    NO GROWTH 5 DAYS Performed at [facility] Lab, [address] [place], [place] [address]    Report Status 03/23/2019 FINAL  Final     Labs: BNP (last 3 results) No results for input(s): BNP in the last 8760 hours. Basic Metabolic Panel: Recent Labs  Lab 03/20/19 0437 03/21/19 0507 03/21/19 1944 03/22/19 0453 03/23/19 0215 03/24/19 0407  NA 135 135 136 137  --  140  K 3.2* 2.6* 3.9 3.7  --  3.9  CL 103 98 101 102  --  105  CO2 23 25 24 23   --  23  GLUCOSE 118* 128* 111* 94  --  99  BUN 8 <5* <5* <5*  --  12  CREATININE 0.61 0.58* 0.61 0.61  --  0.66  CALCIUM 8.5* 8.7* 9.0 8.9  --  9.2  MG 1.9  --   --   --   --  1.8  PHOS 3.2 2.7  --  3.8 3.5  --    Liver Function Tests: Recent Labs  Lab 03/24/19 0407  AST 55*  ALT 35  ALKPHOS 49  BILITOT 0.6  PROT 6.9  ALBUMIN 3.4*   No results for input(s): LIPASE, AMYLASE in the last 168 hours. No results for input(s): AMMONIA in the last 168 hours. CBC: Recent  Labs  Lab 03/20/19 0437 03/21/19 0507 03/24/19 0407  WBC 7.0 7.1 7.2  NEUTROABS  --   --  3.9  HGB 10.2* 11.5* 12.1*  HCT 29.3* 33.6* 35.5*  MCV 99.0 97.7 98.9  PLT 238 284 360   Cardiac Enzymes: No results for input(s): CKTOTAL, CKMB, CKMBINDEX, TROPONINI in the last 168 hours. BNP: Invalid input(s): POCBNP CBG: Recent Labs  Lab 03/21/19 2348 03/22/19 0346 03/22/19 0800 03/22/19 1230 03/22/19 1601  GLUCAP 101* 82 72 99 111*   D-Dimer No results for input(s): DDIMER in the last 72 hours. Hgb A1c No results for input(s): HGBA1C in the last 72 hours. Lipid Profile No results for input(s): CHOL, HDL, LDLCALC, TRIG, CHOLHDL, LDLDIRECT in the last 72 hours. Thyroid function studies No results for input(s): TSH, T4TOTAL, T3FREE, THYROIDAB in the last 72 hours.  Invalid input(s): FREET3 Anemia work up No results for  input(s): VITAMINB12, FOLATE, FERRITIN, TIBC, IRON, RETICCTPCT in the last 72 hours. Urinalysis    Component Value Date/Time   COLORURINE YELLOW 03/18/2019 0700   APPEARANCEUR CLEAR 03/18/2019 0700   LABSPEC >1.046 (H) 03/18/2019 0700   PHURINE 6.0 03/18/2019 0700   GLUCOSEU NEGATIVE 03/18/2019 0700   HGBUR SMALL (A) 03/18/2019 0700   BILIRUBINUR NEGATIVE 03/18/2019 0700   KETONESUR 20 (A) 03/18/2019 0700   PROTEINUR NEGATIVE 03/18/2019 0700   NITRITE NEGATIVE 03/18/2019 0700   LEUKOCYTESUR NEGATIVE 03/18/2019 0700   Sepsis Labs Invalid input(s): PROCALCITONIN,  WBC,  LACTICIDVEN Microbiology Recent Results (from the past 240 hour(s))  SARS Coronavirus 2 (CEPHEID - Performed in [facility] lab), Hosp Order     Status: None   Collection Time: 03/18/19 12:09 AM   Specimen: Nasopharyngeal Swab  Result Value Ref Range Status   SARS Coronavirus 2 NEGATIVE NEGATIVE Final    Comment: (NOTE) If result is NEGATIVE SARS-CoV-2 target nucleic acids are NOT DETECTED. The SARS-CoV-2 RNA is generally detectable in upper and lower  respiratory specimens  during the acute phase of infection. The lowest  concentration of SARS-CoV-2 viral copies this assay can detect is 250  copies / mL. A negative result does not preclude SARS-CoV-2 infection  and should not be used as the sole basis for treatment or other  patient management decisions.  A negative result may occur with  improper specimen collection / handling, submission of specimen other  than nasopharyngeal swab, presence of viral mutation(s) within the  areas targeted by this assay, and inadequate number of viral copies  (<250 copies / mL). A negative result must be combined with clinical  observations, patient history, and epidemiological information. If result is POSITIVE SARS-CoV-2 target nucleic acids are DETECTED. The SARS-CoV-2 RNA is generally detectable in upper and lower  respiratory specimens dur ing the acute phase of infection.  Positive  results are indicative of active infection with SARS-CoV-2.  Clinical  correlation with patient history and other diagnostic information is  necessary to determine patient infection status.  Positive results do  not rule out bacterial infection or co-infection with other viruses. If result is PRESUMPTIVE POSTIVE SARS-CoV-2 nucleic acids MAY BE PRESENT.   A presumptive positive result was obtained on the submitted specimen  and confirmed on repeat testing.  While 2019 novel coronavirus  (SARS-CoV-2) nucleic acids may be present in the submitted sample  additional confirmatory testing may be necessary for epidemiological  and / or clinical management purposes  to differentiate between  SARS-CoV-2 and other Sarbecovirus currently known to infect humans.  If clinically indicated additional testing with an alternate test  methodology [phone]([id number]) is advised. The SARS-CoV-2 RNA is generally  detectable in upper and lower respiratory sp ecimens during the acute  phase of infection. The expected result is Negative. Fact Sheet for Patients:   BoilerBrush.com.cyhttps://www.fda.gov/media/136312/download Fact Sheet for Healthcare Providers: https://pope.com/https://www.fda.gov/media/136313/download This test is not yet approved or cleared by the [place] FDA and has been authorized for detection and/or diagnosis of SARS-CoV-2 by FDA under an Emergency Use Authorization (EUA).  This EUA will remain in effect (meaning this test can be used) for the duration of the COVID-19 declaration under Section 564(b)(1) of the Act, 21 U.S.C. section 360bbb-3(b)(1), unless the authorization is terminated or revoked sooner. Performed at [facility], [address] [place], [place] [address]   MRSA PCR Screening     Status: None   Collection Time: 03/18/19  2:54 AM   Specimen:  Nasal Mucosa; Nasopharyngeal  Result Value Ref Range Status   MRSA by PCR NEGATIVE NEGATIVE Final    Comment:        The GeneXpert MRSA Assay (FDA approved for NASAL specimens only), is one component of a comprehensive MRSA colonization surveillance program. It is not intended to diagnose MRSA infection nor to guide or monitor treatment for MRSA infections. Performed at [facility], [address] [place], [place] [address]   Culture, blood (routine x 2)     Status: None   Collection Time: 03/18/19  4:21 AM   Specimen: BLOOD  Result Value Ref Range Status   Specimen Description BLOOD RIGHT ANTECUBITAL  Final   Special Requests   Final    BOTTLES DRAWN AEROBIC AND ANAEROBIC Blood Culture adequate volume   Culture   Final    NO GROWTH 5 DAYS Performed at [facility], [address] [place], [place] [address]    Report Status 03/23/2019 FINAL  Final  Culture, respiratory (tracheal aspirate)     Status: None   Collection Time: 03/18/19  5:55 AM   Specimen: Tracheal Aspirate; Respiratory  Result Value Ref Range Status   Specimen Description TRACHEAL ASPIRATE  Final   Special Requests NONE  Final   Gram Stain   Final    ABUNDANT WBC PRESENT, PREDOMINANTLY  PMN MODERATE GRAM POSITIVE COCCI RARE GRAM NEGATIVE RODS Performed at [facility], [address] [place], [place] [address]    Culture FEW STREPTOCOCCUS PNEUMONIAE  Final   Report Status 03/21/2019 FINAL  Final   Organism ID, Bacteria STREPTOCOCCUS PNEUMONIAE  Final      Susceptibility   Streptococcus pneumoniae - MIC*    ERYTHROMYCIN >=8 RESISTANT Resistant     LEVOFLOXACIN 2 SENSITIVE Sensitive     VANCOMYCIN 0.5 SENSITIVE Sensitive     PENICILLIN (non-meningitis) 1 SENSITIVE Sensitive     CEFTRIAXONE (non-meningitis) 0.5 SENSITIVE Sensitive     * FEW STREPTOCOCCUS PNEUMONIAE  Culture, blood (routine x 2)     Status: None   Collection Time: 03/18/19  6:31 AM   Specimen: BLOOD LEFT HAND  Result Value Ref Range Status   Specimen Description BLOOD LEFT HAND  Final   Special Requests   Final    BOTTLES DRAWN AEROBIC ONLY Blood Culture results may not be optimal due to an inadequate volume of blood received in culture bottles   Culture   Final    NO GROWTH 5 DAYS Performed at [facility], [address] [place], [place] [address]    Report Status 03/23/2019 FINAL  Final     Time coordinating discharge: Over 30 minutes  SIGNED:   Hughie Clossavi Consandra Laske, MD  [facility] 03/26/2019, 11:42 AM Pager [phone]  If 7PM-7AM, please contact night-coverage [email or web address] Password [email or web address]

## 2019-03-26 NOTE — Progress Notes (Signed)
  Speech Language Pathology Treatment: Dysphagia  Patient Details Name: Ronald [name] MRN: [id number] DOB: 07/18/1956 Today's Date: 03/26/2019 Time: 5631-4970 SLP Time Calculation (min) (ACUTE ONLY): 20 min  Assessment / Plan / Recommendation Clinical Impression  Patient seen to address dysphagia goals with trials of regular solids with thin liquids. (Patient currently on Dys 3, thin liquids diet). Patient did not exhibit any mastication or oral transit delays as he had been, no oral residuals and no overt s/s of aspiration. Patient is significantly more alert and aware of what is going on, and does not present with significant confusion. He stated that the doctor told him he could go home today. Plan is for discharge home where patient lives with his sister. No SLP follow up recommended as patient is tolerating regular solids and thin liquids without need for intervention.    HPI HPI: Pt is a 63 y.o. M with significant PMH of cocaine, tobacco and alcohol use, gout, hypertension, critical limb iscehmia s/p left BKA, remote subdural hematoma who presents with acute encephalopathy, left sided weakness, and seizure like activity. LTM EEG negative for seizures. MRI 7/5 negative for acute abnormality. CXR 7/6 showing R medial and L basilar atelectasis.He was intubated on 7/4 and extubated on 7/7.      SLP Plan  Discharge SLP treatment due to (comment)(goals met and planned discharge home today)       Recommendations  Diet recommendations: Regular;Thin liquid Liquids provided via: Cup;Straw Medication Administration: Whole meds with liquid Supervision: Patient able to self feed Compensations: Minimize environmental distractions;Slow rate;Small sips/bites                Oral Care Recommendations: Oral care BID Follow up Recommendations: None SLP Visit Diagnosis: Dysphagia, oropharyngeal phase (R13.12) Plan: Discharge SLP treatment due to (comment)(goals met and planned discharge home  today)       GO                Ronald [name] 03/26/2019, 12:50 PM  Sonia Baller, MA, [place] [facility] Pager: [phone]

## 2019-03-26 NOTE — Progress Notes (Signed)
Late entry for MBS completed on 03/23/2019   Modified Barium Swallow Progress Note:  Patient Details  Name: Omkar Stratmann MRN: [id number] Date of Birth: 1955/12/31  Today's Date: 03/26/2019  Modified Barium Swallow completed.  Full report located under Chart Review in the Imaging Section.  Brief recommendations include the following:  Clinical Impression  Patient presents with a mild oropharyngeal dysphagia without penetration or aspiration and without pharyngeal residuals. Orally, he had delays in mastication as well as anterior to posterior movement of boluses and brief holding of boluses. He exhibited delay in swallow initiation to level of vallecular sinus with all tested boluses. He was not able to transit a barirum tablet to back of oral cavity for swallow with puree solids or thin liquids. Overall, patient is at a mild risk of aspiration, but is safe to upgrade to Dys 3 thin liquids.   Swallow Evaluation Recommendations       SLP Diet Recommendations: Dysphagia 3 (Mech soft) solids;Thin liquid       Medication Administration: Crushed with puree   Supervision: Patient able to self feed;Staff to assist with self feeding   Compensations: Minimize environmental distractions;Slow rate;Small sips/bites       Oral Care Recommendations: Oral care BID      Sonia Baller, MA, [place] [facility] Pager: [phone]

## 2019-03-26 NOTE — TOC Progression Note (Addendum)
Transition of Care [facility]) - Progression Note    Patient Details  Name: [name] MRN: [id number] Date of Birth: 25-Mar-1956  Transition of Care [facility]) CM/SW Contact  Sharin Mons, RN Phone Number: 03/26/2019, 12:29 PM  Clinical Narrative:    AMS/acute encephalopathy. Hx of of cocaine, tobacco and alcohol use, gout, hypertension, critical limb ischemia status post left BKA, remote subdural hematoma. From home with roommate, Mabeline. Pt states roommate will assist with caring for him once d/c. Declined SNF placement. Agreeable to home health  Services. Choice list provided. Referrals made with [facility], [facility]... both declined. [facility] accepted and will provide home health services for pt. Pt to transition to home today. States brother will provide transportation to home. Nason Conradt (Brother) Pt's cell #    [phone]     PCP: Dr. Kevan Ny  Expected Discharge Plan: [place] Barriers to Discharge: No Barriers Identified  Expected Discharge Plan and Services Expected Discharge Plan: [place]   Discharge Planning Services: CM Consult Post Acute Care Choice: NA Living arrangements for the past 2 months: Single Family Home Expected Discharge Date: 03/26/19               DME Arranged: (pt has a prosthetic leg, W/C)         HH Arranged: RN, PT, OT, Social Work, Theme park manager Therapy HH Agency: [facility] Date [place]: 03/26/19 Time [place] [employer]: 1228 Representative spoke with at [place]: [place] ([place]) Interventions    Readmission Risk Interventions No flowsheet data found.

## 2020-10-17 ENCOUNTER — Emergency Department (HOSPITAL_COMMUNITY): Payer: Medicaid Other

## 2020-10-17 ENCOUNTER — Emergency Department (HOSPITAL_COMMUNITY)
Admission: EM | Admit: 2020-10-17 | Discharge: 2020-10-18 | Disposition: A | Discharge status: Home / Self Care | Payer: Medicaid Other | Attending: Emergency Medicine | Admitting: Emergency Medicine

## 2020-10-17 ENCOUNTER — Other Ambulatory Visit: Payer: Self-pay

## 2020-10-17 DIAGNOSIS — Z79899 Other long term (current) drug therapy: Secondary | ICD-10-CM | POA: Diagnosis not present

## 2020-10-17 DIAGNOSIS — Z20822 Contact with and (suspected) exposure to covid-19: Secondary | ICD-10-CM | POA: Diagnosis not present

## 2020-10-17 DIAGNOSIS — R0602 Shortness of breath: Secondary | ICD-10-CM | POA: Insufficient documentation

## 2020-10-17 DIAGNOSIS — F17210 Nicotine dependence, cigarettes, uncomplicated: Secondary | ICD-10-CM | POA: Diagnosis not present

## 2020-10-17 DIAGNOSIS — J3489 Other specified disorders of nose and nasal sinuses: Secondary | ICD-10-CM | POA: Insufficient documentation

## 2020-10-17 DIAGNOSIS — I10 Essential (primary) hypertension: Secondary | ICD-10-CM | POA: Insufficient documentation

## 2020-10-17 DIAGNOSIS — R059 Cough, unspecified: Secondary | ICD-10-CM | POA: Insufficient documentation

## 2020-10-17 DIAGNOSIS — J441 Chronic obstructive pulmonary disease with (acute) exacerbation: Secondary | ICD-10-CM | POA: Diagnosis not present

## 2020-10-17 LAB — BASIC METABOLIC PANEL
Anion gap: 14 (ref 5–15)
BUN: 5 mg/dL — ABNORMAL LOW (ref 8–23)
CO2: 24 mmol/L (ref 22–32)
Calcium: 9 mg/dL (ref 8.9–10.3)
Chloride: 90 mmol/L — ABNORMAL LOW (ref 98–111)
Creatinine, Ser: 0.64 mg/dL (ref 0.61–1.24)
GFR, Estimated: >60 mL/min (ref >60)
Glucose, Bld: 77 mg/dL (ref 70–99)
Potassium: 3.4 mmol/L — ABNORMAL LOW (ref 3.5–5.1)
Sodium: 128 mmol/L — ABNORMAL LOW (ref 135–145)

## 2020-10-17 LAB — CBC
HCT: 35.9 % — ABNORMAL LOW (ref 39.0–52.0)
Hemoglobin: 12.8 g/dL — ABNORMAL LOW (ref 13.0–17.0)
MCH: 33.6 pg (ref 26.0–34.0)
MCHC: 35.7 g/dL (ref 30.0–36.0)
MCV: 94.2 fL (ref 80.0–100.0)
Platelets: 314 10*3/uL (ref 150–400)
RBC: 3.81 MIL/uL — ABNORMAL LOW (ref 4.22–5.81)
RDW: 12.9 % (ref 11.5–15.5)
WBC: 6.8 10*3/uL (ref 4.0–10.5)
nRBC: 0 % (ref 0.0–0.2)

## 2020-10-17 MED ORDER — ALBUTEROL SULFATE HFA 108 (90 BASE) MCG/ACT IN AERS: 2.0000 | INHALATION_SPRAY | RESPIRATORY_TRACT | Status: DC | PRN

## 2020-10-17 NOTE — ED Triage Notes (Addendum)
Pt presents to ED BIB [facility] from home. Pt c/o cough, hiccups, SOB x40m. Pt has hx COPD. Resp e/u in triage Ems VS -  132/86 99-100% RA

## 2020-10-18 LAB — COMPREHENSIVE METABOLIC PANEL
ALT: 18 U/L (ref 0–44)
AST: 25 U/L (ref 15–41)
Albumin: 3.9 g/dL (ref 3.5–5.0)
Alkaline Phosphatase: 69 U/L (ref 38–126)
Anion gap: 12 (ref 5–15)
BUN: 13 mg/dL (ref 8–23)
CO2: 27 mmol/L (ref 22–32)
Calcium: 9.5 mg/dL (ref 8.9–10.3)
Chloride: 93 mmol/L — ABNORMAL LOW (ref 98–111)
Creatinine, Ser: 0.87 mg/dL (ref 0.61–1.24)
GFR, Estimated: >60 mL/min (ref >60)
Glucose, Bld: 114 mg/dL — ABNORMAL HIGH (ref 70–99)
Potassium: 3.9 mmol/L (ref 3.5–5.1)
Sodium: 132 mmol/L — ABNORMAL LOW (ref 135–145)
Total Bilirubin: 0.7 mg/dL (ref 0.3–1.2)
Total Protein: 7.3 g/dL (ref 6.5–8.1)

## 2020-10-18 LAB — TROPONIN I (HIGH SENSITIVITY): Troponin I (High Sensitivity): 8 ng/L (ref <18)

## 2020-10-18 LAB — D-DIMER, QUANTITATIVE: D-Dimer, Quant: 0.3 ug/mL-FEU (ref 0.00–0.50)

## 2020-10-18 LAB — MAGNESIUM: Magnesium: 1.7 mg/dL (ref 1.7–2.4)

## 2020-10-18 LAB — BRAIN NATRIURETIC PEPTIDE: B Natriuretic Peptide: 21.9 pg/mL (ref 0.0–100.0)

## 2020-10-18 MED ORDER — ALBUTEROL SULFATE HFA 108 (90 BASE) MCG/ACT IN AERS
4.0000 | INHALATION_SPRAY | Freq: Once | RESPIRATORY_TRACT | Status: AC
  Administered 2020-10-18: 4 via RESPIRATORY_TRACT
  Filled 2020-10-18: qty 6.7

## 2020-10-18 MED ORDER — ALBUTEROL SULFATE HFA 108 (90 BASE) MCG/ACT IN AERS: 2.0000 | INHALATION_SPRAY | RESPIRATORY_TRACT | 0 refills | Status: DC | PRN

## 2020-10-18 MED ORDER — PREDNISONE 20 MG PO TABS
40.0000 mg | ORAL_TABLET | Freq: Once | ORAL | Status: AC
  Administered 2020-10-18: 40 mg via ORAL
  Filled 2020-10-18: qty 2

## 2020-10-18 MED ORDER — PREDNISONE 10 MG PO TABS: 40.0000 mg | ORAL_TABLET | Freq: Every day | ORAL | 0 refills | Status: DC

## 2020-10-18 NOTE — ED Notes (Signed)
Please call, sister, Rosetta for an update when pt roomed.

## 2020-10-18 NOTE — Discharge Instructions (Addendum)
Use the albuterol inhaler 2 to 4 puffed every four hours as needed for wheezing. Please avoid tobacco, crack, alcohol as this will make your breathing worse. Get rechecked immediately if you develop severe chest pain, worsening breathing or new concerning symptoms.

## 2020-10-18 NOTE — ED Provider Notes (Signed)
[facility] EMERGENCY DEPARTMENT Provider Note   CSN: [id number] Arrival date & time: 10/17/20  1615     History Chief Complaint  Patient presents with  . Shortness of Breath    Ronald [name] is a 65 y.o. male.  The history is provided by the patient and medical records.  Shortness of Breath  Ronald [name] is a 65 y.o. male who presents to the Emergency Department complaining of sob.  He presents to the ED complaining of sob and hiccups for the last three months.  Has associated nonproductive cough, rhinorrhea. Feels similar to prior bronchitis.    Denies fever, chest pain, abdominal pain, N/V/D, leg swelling/pain.    Has a hx/o HTN - takes lisinopril and amlodipine - has been out of meds for last four months.  Smokes tobacco, drink alcohol - 2, 40 oz beer every other day.  Occasional crack use - last use two weeks ago.    Lives with friends.   Has been vaccinated for COVID 19, has not been boosted.    No hx/o DVT/PE    Past Medical History:  Diagnosis Date  . Critical lower limb ischemia   . ETOH abuse   . Gout   . Hypertension     Patient Active Problem List   Diagnosis Date Noted  . Hyponatremia 03/26/2019  . Altered mental status 03/26/2019  . Acute hypoxemic respiratory failure (HCC) 03/26/2019  . Dysphagia 03/26/2019  . Acute encephalopathy 03/18/2019  . COPD exacerbation (HCC) 08/29/2017  . Subdural hematoma (HCC) 06/19/2014  . Critical lower limb ischemia (HCC) 12/25/2013  . Osteomyelitis (HCC) 11/06/2013  . HTN (hypertension), benign 11/06/2013  . Mild alcohol use disorder 11/06/2013  . Osteomyelitis of left foot (HCC) 11/06/2013  . COPD (chronic obstructive pulmonary disease) (HCC) 11/06/2013  . Accelerated hypertension 09/25/2013  . Hypokalemia 09/25/2013  . Abscess of left foot 09/21/2013  . Cellulitis of left foot 09/19/2013    Past Surgical History:  Procedure Laterality Date  . ABDOMINAL AORTAGRAM N/A 12/26/2013    Procedure: ABDOMINAL Ronny Flurry;  Surgeon: [name], MD;  Location: [facility] CATH LAB;  Service: Cardiovascular;  Laterality: N/A;  . AMPUTATION Left 12/28/2013   Procedure: LEFT BELOW KNEE AMPUTATION;  Surgeon: Nadara Mustard, MD;  Location: [facility] OR;  Service: Orthopedics;  Laterality: Left;  . BELOW KNEE LEG AMPUTATION     Left  . LEG SURGERY         Family History  Problem Relation Age of Onset  . Diabetes Mellitus II Mother   . CAD Neg Hx   . Stroke Neg Hx     Social History   Tobacco Use  . Smoking status: Current Every Day Smoker    Packs/day: 0.50    Years: 40.00    Pack years: 20.00    Types: Cigarettes  . Smokeless tobacco: Never Used  Vaping Use  . Vaping Use: Every day  Substance Use Topics  . Alcohol use: Yes    Alcohol/week: 120.0 standard drinks    Types: 120 Cans of beer per week    Comment: 2 -3 40 oz beers per day  . Drug use: Yes    Types: Cocaine, Marijuana    Comment: crack    Home Medications Prior to Admission medications   Medication Sig Start Date End Date Taking? Authorizing Provider  albuterol (VENTOLIN HFA) 108 (90 Base) MCG/ACT inhaler Inhale 2 puffs into the lungs every 4 (four) hours as needed for wheezing or shortness of  breath. 10/18/20  Yes Tilden Fossa, MD  ibuprofen (ADVIL) 200 MG tablet Take 200 mg by mouth every 6 (six) hours as needed for mild pain.   Yes [provider]  predniSONE (DELTASONE) 10 MG tablet Take 4 tablets (40 mg total) by mouth daily. 10/18/20  Yes Tilden Fossa, MD  amLODipine (NORVASC) 10 MG tablet Take 10 mg by mouth daily. Patient not taking: Reported on 10/18/2020    [provider]  amLODipine (NORVASC) 5 MG tablet Take 1 tablet (5 mg total) by mouth daily. 03/26/19 03/25/20  Hughie Closs, MD  folic acid (FOLVITE) 1 MG tablet Take 1 tablet (1 mg total) by mouth daily. Patient not taking: No sig reported 06/19/14   Edsel Petrin, DO  lisinopril (ZESTRIL) 10 MG tablet Take 1 tablet (10 mg total)  by mouth daily. 03/26/19 03/25/20  Hughie Closs, MD  Multiple Vitamin (MULTIVITAMIN WITH MINERALS) TABS tablet Take 1 tablet by mouth daily. Patient not taking: No sig reported 06/19/14   Edsel Petrin, DO  thiamine 100 MG tablet Take 1 tablet (100 mg total) by mouth daily. Patient not taking: No sig reported 06/19/14   Edsel Petrin, DO    Allergies    Patient has no known allergies.  Review of Systems   Review of Systems  Respiratory: Positive for shortness of breath.   All other systems reviewed and are negative.   Physical Exam Updated Vital Signs BP (!) 144/84   Pulse 90   Temp 98.9 F (37.2 C) (Oral)   Resp 19   SpO2 96%   Physical Exam Vitals and nursing note reviewed.  Constitutional:      Appearance: He is well-developed and well-nourished.  HENT:     Head: Normocephalic and atraumatic.  Cardiovascular:     Rate and Rhythm: Regular rhythm. Tachycardia present.     Heart sounds: No murmur heard.   Pulmonary:     Effort: Pulmonary effort is normal. No respiratory distress.     Comments: Decreased air movement bilaterally Abdominal:     Palpations: Abdomen is soft.     Tenderness: There is no abdominal tenderness. There is no guarding or rebound.  Musculoskeletal:        General: No tenderness or edema.     Comments: LLE BKA.  Trace edema to RLE.  2+ right DP pulse  Skin:    General: Skin is warm and dry.  Neurological:     Mental Status: He is alert and oriented to person, place, and time.  Psychiatric:        Mood and Affect: Mood and affect normal.        Behavior: Behavior normal.     ED Results / Procedures / Treatments   Labs (all labs ordered are listed, but only abnormal results are displayed) Labs Reviewed  BASIC METABOLIC PANEL - Abnormal; Notable for the following components:      Result Value   Sodium 128 (*)    Potassium 3.4 (*)    Chloride 90 (*)    BUN 5 (*)    All other components within normal limits  CBC - Abnormal; Notable  for the following components:   RBC 3.81 (*)    Hemoglobin 12.8 (*)    HCT 35.9 (*)    All other components within normal limits  COMPREHENSIVE METABOLIC PANEL - Abnormal; Notable for the following components:   Sodium 132 (*)    Chloride 93 (*)    Glucose, Bld 114 (*)  All other components within normal limits  SARS CORONAVIRUS 2 (TAT 6-24 HRS)  BRAIN NATRIURETIC PEPTIDE  D-DIMER, QUANTITATIVE (NOT AT [facility])  MAGNESIUM  TROPONIN I (HIGH SENSITIVITY)    EKG EKG Interpretation  Date/Time:  Friday October 17 2020 16:17:56 EST Ventricular Rate:  98 PR Interval:  154 QRS Duration: 132 QT Interval:  386 QTC Calculation: 492 R Axis:   -67 Text Interpretation: Normal sinus rhythm Right bundle branch block Left anterior fascicular block ** Bifascicular block ** Abnormal ECG Confirmed by Tilden Fossa [phone]) on 10/18/2020 2:55:28 PM   Radiology DG Chest 2 View  Result Date: 10/17/2020 CLINICAL DATA:  Short of breath for 4 months EXAM: CHEST - 2 VIEW COMPARISON:  03/20/2019 FINDINGS: Frontal and lateral views of the chest demonstrate an unremarkable cardiac silhouette. No airspace disease, effusion, or pneumothorax. There are no acute bony abnormalities. IMPRESSION: 1. No acute intrathoracic process. Electronically Signed   By: Sharlet Salina M.D.   On: 10/17/2020 16:56    Procedures Procedures   Medications Ordered in ED Medications  albuterol (VENTOLIN HFA) 108 (90 Base) MCG/ACT inhaler 2 puff (has no administration in time range)  albuterol (VENTOLIN HFA) 108 (90 Base) MCG/ACT inhaler 4 puff (4 puffs Inhalation Given 10/18/20 1555)  predniSONE (DELTASONE) tablet 40 mg (40 mg Oral Given 10/18/20 2103)    ED Course  I have reviewed the triage vital signs and the nursing notes.  Pertinent labs & imaging results that were available during my care of the patient were reviewed by me and considered in my medical decision making (see chart for details).    MDM  Rules/Calculators/A&P                          Patient with history of hypertension, substance abuse here for evaluation of three months of shortness of breath. On initial evaluation patient with decreased air movement bilaterally. After albuterol use he is feeling improved, has occasional and expiratory wheezes. No evidence of acute pneumonia. Presentation is not consistent with PE, D dimer is negative. Presentation is not consistent with CHF, ACS, dissection. Will treat for reactive airway exacerbation. Of note he does have a history of hypertension, has been off of his medications. No evidence of hypertensive urgency. Blood pressure only minimally elevated during ED stay, will not reinitiate these medications at this time. Sister updated of findings of studies and treatment plan over the phone. Final Clinical Impression(s) / ED Diagnoses Final diagnoses:  Shortness of breath    Rx / DC Orders ED Discharge Orders         Ordered    predniSONE (DELTASONE) 10 MG tablet  Daily        10/18/20 2000    albuterol (VENTOLIN HFA) 108 (90 Base) MCG/ACT inhaler  Every 4 hours PRN        10/18/20 Terese Door, MD 10/18/20 2326

## 2020-10-18 NOTE — ED Notes (Signed)
Nathaline Johnson (Caregiver#[phone]) called for an update on patient's status.  She would like someone to give patient's something to eat.  She stated: patient has not had anything to eat since yesterday.  Thank you

## 2020-10-19 LAB — SARS CORONAVIRUS 2 (TAT 6-24 HRS): SARS Coronavirus 2: NEGATIVE

## 2022-02-02 ENCOUNTER — Emergency Department (HOSPITAL_COMMUNITY)
Admission: EM | Admit: 2022-02-02 | Discharge: 2022-02-03 | Disposition: A | Discharge status: Home / Self Care | Payer: Medicaid Other | Attending: Emergency Medicine | Admitting: Emergency Medicine

## 2022-02-02 ENCOUNTER — Emergency Department (HOSPITAL_COMMUNITY): Payer: Medicaid Other

## 2022-02-02 ENCOUNTER — Other Ambulatory Visit: Payer: Self-pay

## 2022-02-02 DIAGNOSIS — I10 Essential (primary) hypertension: Secondary | ICD-10-CM | POA: Diagnosis not present

## 2022-02-02 DIAGNOSIS — F17210 Nicotine dependence, cigarettes, uncomplicated: Secondary | ICD-10-CM | POA: Insufficient documentation

## 2022-02-02 DIAGNOSIS — E871 Hypo-osmolality and hyponatremia: Secondary | ICD-10-CM | POA: Diagnosis not present

## 2022-02-02 DIAGNOSIS — R0602 Shortness of breath: Secondary | ICD-10-CM | POA: Diagnosis present

## 2022-02-02 DIAGNOSIS — J441 Chronic obstructive pulmonary disease with (acute) exacerbation: Secondary | ICD-10-CM | POA: Diagnosis not present

## 2022-02-02 DIAGNOSIS — Z79899 Other long term (current) drug therapy: Secondary | ICD-10-CM | POA: Diagnosis not present

## 2022-02-02 DIAGNOSIS — Z7952 Long term (current) use of systemic steroids: Secondary | ICD-10-CM | POA: Insufficient documentation

## 2022-02-02 MED ORDER — IPRATROPIUM-ALBUTEROL 0.5-2.5 (3) MG/3ML IN SOLN
3.0000 mL | Freq: Once | RESPIRATORY_TRACT | Status: AC
  Administered 2022-02-02: 3 mL via RESPIRATORY_TRACT
  Filled 2022-02-02: qty 3

## 2022-02-02 NOTE — ED Provider Notes (Signed)
[facility] EMERGENCY DEPARTMENT Provider Note   CSN: [id number] Arrival date & time: 02/02/22  2306     History {Add pertinent medical, surgical, social history, OB history to HPI:1} Chief Complaint  Patient presents with   Shortness of Ronald [name] is a 66 y.o. male.  The history is provided by the patient.  Shortness of Breath He has a history of hypertension, COPD, gout and comes in with shortness of breath over the last 2 months.  Dyspnea has been getting progressively worse.  During that time, he has had a cough productive of a small amount of clear sputum, and has also had hiccups.  He denies fever, chills, sweats.  He denies chest pain, heaviness, tightness, pressure.  He had been using his inhaler with only slight relief.  Today, he has had no relief from using his inhaler.  He came in by ambulance where he received a nebulizer treatment without any improvement.  He also received methylprednisolone.  He does admit to still smoking 3-4 cigarettes a day.  He is not sure if he has had any weight loss.  He does relate that he has been having difficulty swallowing things over the last 2 months, even liquids.   Home Medications Prior to Admission medications   Medication Sig Start Date End Date Taking? Authorizing Provider  albuterol (VENTOLIN HFA) 108 (90 Base) MCG/ACT inhaler Inhale 2 puffs into the lungs every 4 (four) hours as needed for wheezing or shortness of breath. 10/18/20   Quintella Reichert, MD  amLODipine (NORVASC) 10 MG tablet Take 10 mg by mouth daily. Patient not taking: Reported on 10/18/2020    [provider]  amLODipine (NORVASC) 5 MG tablet Take 1 tablet (5 mg total) by mouth daily. 03/26/19 03/25/20  Darliss Cheney, MD  folic acid (FOLVITE) 1 MG tablet Take 1 tablet (1 mg total) by mouth daily. Patient not taking: No sig reported 06/19/14   Cristal Ford, DO  ibuprofen (ADVIL) 200 MG tablet Take 200 mg by mouth every 6 (six)  hours as needed for mild pain.    [provider]  lisinopril (ZESTRIL) 10 MG tablet Take 1 tablet (10 mg total) by mouth daily. 03/26/19 03/25/20  Darliss Cheney, MD  Multiple Vitamin (MULTIVITAMIN WITH MINERALS) TABS tablet Take 1 tablet by mouth daily. Patient not taking: No sig reported 06/19/14   Cristal Ford, DO  predniSONE (DELTASONE) 10 MG tablet Take 4 tablets (40 mg total) by mouth daily. 10/18/20   Quintella Reichert, MD  thiamine 100 MG tablet Take 1 tablet (100 mg total) by mouth daily. Patient not taking: No sig reported 06/19/14   Cristal Ford, DO      Allergies    Patient has no known allergies.    Review of Systems   Review of Systems  Respiratory:  Positive for shortness of breath.   All other systems reviewed and are negative.  Physical Exam Updated Vital Signs BP (!) 179/91   Pulse 95   Temp 98.1 F (36.7 C) (Oral)   Resp (!) 29   Ht 5\' 4"  (1.626 m)   Wt 72.6 kg   SpO2 96%   BMI 27.46 kg/m  Physical Exam Vitals and nursing note reviewed.  66 year old male, appears dyspneic at rest, but is in no acute distress.  He is not using accessory muscles of respiration.  Vital signs are significant for elevated respiratory rate and blood pressure. Oxygen saturation is 96%, which is normal.  Head is normocephalic and atraumatic. PERRLA, EOMI. Oropharynx is clear. Neck is nontender and supple without adenopathy or JVD. Back is nontender and there is no CVA tenderness. Lungs have markedly diminished breath sounds throughout without overt rales or wheezes or rhonchi.  Prolonged expiration phase is noted. Chest is nontender. Heart has regular rate and rhythm without murmur. Abdomen is soft, flat, nontender without masses or hepatosplenomegaly and peristalsis is normoactive. Extremities have no cyanosis or edema, full range of motion is present.  Left below the knee amputation is present. Skin is warm and dry without rash. Neurologic: Mental status is normal,  cranial nerves are intact, moves all extremities equally.  ED Results / Procedures / Treatments   Labs (all labs ordered are listed, but only abnormal results are displayed) Labs Reviewed - No data to display  EKG EKG Interpretation  Date/Time:  Tuesday Feb 02 2022 23:15:57 EDT Ventricular Rate:  92 PR Interval:  175 QRS Duration: 144 QT Interval:  409 QTC Calculation: 506 R Axis:   -67 Text Interpretation: Sinus rhythm RBBB and LAFB When compared with ECG of 10/17/2020, No significant change was found Confirmed by Delora Fuel ([id number]) on 02/02/2022 11:24:29 PM  Radiology No results found.  Procedures Procedures  Cardiac monitor shows normal sinus rhythm, per my interpretation.  Medications Ordered in ED Medications - No data to display  ED Course/ Medical Decision Making/ A&P                           Medical Decision Making Amount and/or Complexity of Data Reviewed Labs: ordered. Radiology: ordered.  Risk Prescription drug management.   Progressive dyspnea with cough and hiccups concerning for possible new malignancy.  Difficulty swallowing and hiccups are concerning for possible esophageal malignancy.  This could be a COPD exacerbation, but it is concerning that it is not responding to beta agonists.  Consider pulmonary embolism.  Also, consider heart failure although no other findings suggestive of heart failure.  Also consider pneumothorax, pneumonia.  Old records are reviewed, and his last ED visit for COPD was on 10/17/2020, last hospitalization for COPD was 08/29/2017.  Will check chest x-ray, screening labs including BNP and D-dimer to rule out heart failure and pulmonary embolism.  Will give additional albuterol with ipratropium to treat possible COPD, will check ABG to make sure he is not having any carbon dioxide retention.  It is noted on the blood gas on 03/18/2019 that he did have PCO2 of 50 with pH of 7.31.  {Document critical care time when  appropriate:1} {Document review of labs and clinical decision tools ie heart score, Chads2Vasc2 etc:1}  {Document your independent review of radiology images, and any outside records:1} {Document your discussion with family members, caretakers, and with consultants:1} {Document social determinants of health affecting pt's care:1} {Document your decision making why or why not admission, treatments were needed:1} Final Clinical Impression(s) / ED Diagnoses Final diagnoses:  None    Rx / DC Orders ED Discharge Orders     None

## 2022-02-02 NOTE — ED Notes (Signed)
Dr. Glick at bedside.  

## 2022-02-02 NOTE — ED Triage Notes (Signed)
Pt BIB EMS from home. Wife reports that pt has had worsening SOB for the last 2 months. Pt has hx of COPD and emphysema. Pt has a home inhaler that he uses at morning and night but ran out and did not have night dose. Pt with decreased breath sounds in the lower lobes and wheezing in the uppers. PT 93% on room air - EMS gave 125 solumedrol, 10 albuterol, 1 atrovent. Pt 100% on neb.  Pt denies any pain at this time  VS with EMS  172/100 HR in the 90s  RR 22  20LFA

## 2022-02-03 LAB — I-STAT ARTERIAL BLOOD GAS, ED
Acid-Base Excess: 0 mmol/L (ref 0.0–2.0)
Bicarbonate: 25.8 mmol/L (ref 20.0–28.0)
Calcium, Ion: 1.14 mmol/L — ABNORMAL LOW (ref 1.15–1.40)
HCT: 41 % (ref 39.0–52.0)
Hemoglobin: 13.9 g/dL (ref 13.0–17.0)
O2 Saturation: 91 %
Patient temperature: 98.1
Potassium: 3.7 mmol/L (ref 3.5–5.1)
Sodium: 127 mmol/L — ABNORMAL LOW (ref 135–145)
TCO2: 27 mmol/L (ref 22–32)
pCO2 arterial: 45.8 mmHg (ref 32–48)
pH, Arterial: 7.358 (ref 7.35–7.45)
pO2, Arterial: 64 mmHg — ABNORMAL LOW (ref 83–108)

## 2022-02-03 LAB — CBC WITH DIFFERENTIAL/PLATELET
Abs Immature Granulocytes: 0.02 10*3/uL (ref 0.00–0.07)
Basophils Absolute: 0 10*3/uL (ref 0.0–0.1)
Basophils Relative: 1 %
Eosinophils Absolute: 0.2 10*3/uL (ref 0.0–0.5)
Eosinophils Relative: 3 %
HCT: 38.7 % — ABNORMAL LOW (ref 39.0–52.0)
Hemoglobin: 13.6 g/dL (ref 13.0–17.0)
Immature Granulocytes: 0 %
Lymphocytes Relative: 18 %
Lymphs Abs: 1.2 10*3/uL (ref 0.7–4.0)
MCH: 33 pg (ref 26.0–34.0)
MCHC: 35.1 g/dL (ref 30.0–36.0)
MCV: 93.9 fL (ref 80.0–100.0)
Monocytes Absolute: 0.2 10*3/uL (ref 0.1–1.0)
Monocytes Relative: 4 %
Neutro Abs: 4.9 10*3/uL (ref 1.7–7.7)
Neutrophils Relative %: 74 %
Platelets: 405 10*3/uL — ABNORMAL HIGH (ref 150–400)
RBC: 4.12 MIL/uL — ABNORMAL LOW (ref 4.22–5.81)
RDW: 12.9 % (ref 11.5–15.5)
WBC: 6.6 10*3/uL (ref 4.0–10.5)
nRBC: 0 % (ref 0.0–0.2)

## 2022-02-03 LAB — COMPREHENSIVE METABOLIC PANEL
ALT: 18 U/L (ref 0–44)
AST: 32 U/L (ref 15–41)
Albumin: 4.1 g/dL (ref 3.5–5.0)
Alkaline Phosphatase: 62 U/L (ref 38–126)
Anion gap: 14 (ref 5–15)
BUN: <5 mg/dL — ABNORMAL LOW (ref 8–23)
CO2: 23 mmol/L (ref 22–32)
Calcium: 8.5 mg/dL — ABNORMAL LOW (ref 8.9–10.3)
Chloride: 92 mmol/L — ABNORMAL LOW (ref 98–111)
Creatinine, Ser: 0.61 mg/dL (ref 0.61–1.24)
GFR, Estimated: >60 mL/min (ref >60)
Glucose, Bld: 102 mg/dL — ABNORMAL HIGH (ref 70–99)
Potassium: 3.7 mmol/L (ref 3.5–5.1)
Sodium: 129 mmol/L — ABNORMAL LOW (ref 135–145)
Total Bilirubin: 0.5 mg/dL (ref 0.3–1.2)
Total Protein: 7.4 g/dL (ref 6.5–8.1)

## 2022-02-03 LAB — D-DIMER, QUANTITATIVE: D-Dimer, Quant: <0.27 ug/mL-FEU (ref 0.00–0.50)

## 2022-02-03 LAB — BRAIN NATRIURETIC PEPTIDE: B Natriuretic Peptide: 37.7 pg/mL (ref 0.0–100.0)

## 2022-02-03 MED ORDER — IPRATROPIUM-ALBUTEROL 0.5-2.5 (3) MG/3ML IN SOLN
3.0000 mL | Freq: Once | RESPIRATORY_TRACT | Status: AC
  Administered 2022-02-03: 3 mL via RESPIRATORY_TRACT
  Filled 2022-02-03: qty 3

## 2022-02-03 MED ORDER — PREDNISONE 50 MG PO TABS: 50.0000 mg | ORAL_TABLET | Freq: Every day | ORAL | 0 refills | Status: DC

## 2022-02-03 NOTE — Discharge Instructions (Signed)
Continue using your inhaler every four hours, as needed.  Return if your symptoms are not being adequately controlled at home.

## 2022-02-03 NOTE — ED Notes (Signed)
Caretaker Clearence Cheek [phone] wants an update asap

## 2022-02-03 NOTE — ED Notes (Signed)
Pt desatting to 88% while sleeping - placed on 2L [place] with improvement - MD notified

## 2022-02-03 NOTE — ED Notes (Signed)
Patient verbalizes understanding of discharge instructions. Opportunity for questioning and answers were provided. Armband removed by staff, pt discharged from ED via wheelchair.  

## 2022-02-03 NOTE — ED Notes (Signed)
Pt caregiver given an update and coming to get pt - pt requesting to wait outside for caregiver - snack and drink given prior to discharge

## 2022-04-09 ENCOUNTER — Other Ambulatory Visit: Payer: Self-pay | Admitting: Family

## 2022-04-09 DIAGNOSIS — R633 Feeding difficulties, unspecified: Secondary | ICD-10-CM

## 2022-04-09 DIAGNOSIS — R1310 Dysphagia, unspecified: Secondary | ICD-10-CM

## 2022-04-14 ENCOUNTER — Encounter (HOSPITAL_COMMUNITY): Payer: Self-pay | Admitting: *Deleted

## 2022-04-14 ENCOUNTER — Inpatient Hospital Stay (HOSPITAL_COMMUNITY)
Admission: EM | Admit: 2022-04-14 | Discharge: 2022-04-16 | DRG: 641 | Disposition: A | Discharge status: Home / Self Care | Payer: Medicare Other | Attending: Internal Medicine | Admitting: Internal Medicine

## 2022-04-14 ENCOUNTER — Emergency Department (HOSPITAL_COMMUNITY): Payer: Medicare Other

## 2022-04-14 ENCOUNTER — Other Ambulatory Visit: Payer: Self-pay

## 2022-04-14 DIAGNOSIS — M109 Gout, unspecified: Secondary | ICD-10-CM | POA: Diagnosis not present

## 2022-04-14 DIAGNOSIS — I959 Hypotension, unspecified: Secondary | ICD-10-CM | POA: Diagnosis present

## 2022-04-14 DIAGNOSIS — Y900 Blood alcohol level of less than 20 mg/100 ml: Secondary | ICD-10-CM | POA: Diagnosis not present

## 2022-04-14 DIAGNOSIS — Z9981 Dependence on supplemental oxygen: Secondary | ICD-10-CM | POA: Diagnosis not present

## 2022-04-14 DIAGNOSIS — I10 Essential (primary) hypertension: Secondary | ICD-10-CM | POA: Diagnosis not present

## 2022-04-14 DIAGNOSIS — E8779 Other fluid overload: Secondary | ICD-10-CM | POA: Diagnosis not present

## 2022-04-14 DIAGNOSIS — Z79899 Other long term (current) drug therapy: Secondary | ICD-10-CM | POA: Diagnosis not present

## 2022-04-14 DIAGNOSIS — J449 Chronic obstructive pulmonary disease, unspecified: Secondary | ICD-10-CM | POA: Diagnosis present

## 2022-04-14 DIAGNOSIS — F17210 Nicotine dependence, cigarettes, uncomplicated: Secondary | ICD-10-CM | POA: Diagnosis present

## 2022-04-14 DIAGNOSIS — E871 Hypo-osmolality and hyponatremia: Secondary | ICD-10-CM | POA: Diagnosis present

## 2022-04-14 DIAGNOSIS — F1010 Alcohol abuse, uncomplicated: Secondary | ICD-10-CM | POA: Diagnosis not present

## 2022-04-14 DIAGNOSIS — Z89512 Acquired absence of left leg below knee: Secondary | ICD-10-CM | POA: Diagnosis not present

## 2022-04-14 LAB — COMPREHENSIVE METABOLIC PANEL
ALT: 18 U/L (ref 0–44)
AST: 25 U/L (ref 15–41)
Albumin: 3.9 g/dL (ref 3.5–5.0)
Alkaline Phosphatase: 61 U/L (ref 38–126)
Anion gap: 13 (ref 5–15)
BUN: 5 mg/dL — ABNORMAL LOW (ref 8–23)
CO2: 24 mmol/L (ref 22–32)
Calcium: 8.6 mg/dL — ABNORMAL LOW (ref 8.9–10.3)
Chloride: 83 mmol/L — ABNORMAL LOW (ref 98–111)
Creatinine, Ser: 0.66 mg/dL (ref 0.61–1.24)
GFR, Estimated: >60 mL/min (ref >60)
Glucose, Bld: 78 mg/dL (ref 70–99)
Potassium: 3.7 mmol/L (ref 3.5–5.1)
Sodium: 120 mmol/L — ABNORMAL LOW (ref 135–145)
Total Bilirubin: 0.7 mg/dL (ref 0.3–1.2)
Total Protein: 7.4 g/dL (ref 6.5–8.1)

## 2022-04-14 LAB — CBC WITH DIFFERENTIAL/PLATELET
Abs Immature Granulocytes: 0.1 10*3/uL — ABNORMAL HIGH (ref 0.00–0.07)
Basophils Absolute: 0.1 10*3/uL (ref 0.0–0.1)
Basophils Relative: 1 %
Eosinophils Absolute: 0.2 10*3/uL (ref 0.0–0.5)
Eosinophils Relative: 3 %
HCT: 31.3 % — ABNORMAL LOW (ref 39.0–52.0)
Hemoglobin: 11.6 g/dL — ABNORMAL LOW (ref 13.0–17.0)
Immature Granulocytes: 1 %
Lymphocytes Relative: 14 %
Lymphs Abs: 1 10*3/uL (ref 0.7–4.0)
MCH: 34.3 pg — ABNORMAL HIGH (ref 26.0–34.0)
MCHC: 37.1 g/dL — ABNORMAL HIGH (ref 30.0–36.0)
MCV: 92.6 fL (ref 80.0–100.0)
Monocytes Absolute: 0.4 10*3/uL (ref 0.1–1.0)
Monocytes Relative: 6 %
Neutro Abs: 5.2 10*3/uL (ref 1.7–7.7)
Neutrophils Relative %: 75 %
Platelets: 291 10*3/uL (ref 150–400)
RBC: 3.38 MIL/uL — ABNORMAL LOW (ref 4.22–5.81)
RDW: 12.9 % (ref 11.5–15.5)
WBC: 7 10*3/uL (ref 4.0–10.5)
nRBC: 0 % (ref 0.0–0.2)

## 2022-04-14 LAB — OSMOLALITY, URINE: Osmolality, Ur: 120 mOsm/kg — ABNORMAL LOW (ref 300–900)

## 2022-04-14 LAB — URINALYSIS, ROUTINE W REFLEX MICROSCOPIC
Bilirubin Urine: NEGATIVE
Glucose, UA: NEGATIVE mg/dL
Hgb urine dipstick: NEGATIVE
Ketones, ur: 5 mg/dL — AB
Leukocytes,Ua: NEGATIVE
Nitrite: NEGATIVE
Protein, ur: NEGATIVE mg/dL
Specific Gravity, Urine: 1.003 — ABNORMAL LOW (ref 1.005–1.030)
pH: 6 (ref 5.0–8.0)

## 2022-04-14 LAB — SODIUM, URINE, RANDOM: Sodium, Ur: <10 mmol/L

## 2022-04-14 MED ORDER — THIAMINE HCL 100 MG/ML IJ SOLN
100.0000 mg | Freq: Every day | INTRAMUSCULAR | Status: DC
Start: 2022-04-14 — End: 2022-04-16
  Administered 2022-04-14 – 2022-04-16 (×2): 100 mg via INTRAVENOUS
  Filled 2022-04-14: qty 2

## 2022-04-14 MED ORDER — IPRATROPIUM-ALBUTEROL 0.5-2.5 (3) MG/3ML IN SOLN
3.0000 mL | Freq: Once | RESPIRATORY_TRACT | Status: AC
  Administered 2022-04-14: 3 mL via RESPIRATORY_TRACT
  Filled 2022-04-14: qty 3

## 2022-04-14 MED ORDER — FOLIC ACID 1 MG PO TABS
1.0000 mg | ORAL_TABLET | Freq: Every day | ORAL | Status: DC
  Administered 2022-04-15 – 2022-04-16 (×3): 1 mg via ORAL
  Filled 2022-04-14 (×3): qty 1

## 2022-04-14 MED ORDER — LORAZEPAM 2 MG/ML IJ SOLN
1.0000 mg | INTRAMUSCULAR | Status: DC | PRN
  Administered 2022-04-15: 2 mg via INTRAVENOUS
  Filled 2022-04-14: qty 1

## 2022-04-14 MED ORDER — THIAMINE HCL 100 MG PO TABS
100.0000 mg | ORAL_TABLET | Freq: Every day | ORAL | Status: DC
Start: 2022-04-14 — End: 2022-04-16
  Administered 2022-04-15: 100 mg via ORAL
  Filled 2022-04-14 (×3): qty 1

## 2022-04-14 MED ORDER — ADULT MULTIVITAMIN W/MINERALS CH
1.0000 | ORAL_TABLET | Freq: Every day | ORAL | Status: DC
  Administered 2022-04-14 – 2022-04-16 (×3): 1 via ORAL
  Filled 2022-04-14 (×3): qty 1

## 2022-04-14 MED ORDER — LORAZEPAM 1 MG PO TABS: 1.0000 mg | ORAL_TABLET | ORAL | Status: DC | PRN

## 2022-04-14 NOTE — TOC CAGE-AID Note (Signed)
Transition of Care [facility]) - CAGE-AID Screening   Patient Details  Name: Ronald [name] MRN: [id number] Date of Birth: December 01, 1955  [facility]) CM/SW Contact:    Ronald [name], LCSWA Phone Number: 04/14/2022, 11:19 PM   Clinical Narrative: Pt participated in Cage-Aid.  Pt stated he does use ETOH.  Pt was offered resources, due to usage of ETOH.     Ronald [name], MSW, LCSW-A Pronouns:  She/Her/Hers [facility] Clinical Social Worker Direct Number:  [phone] [name]  CAGE-AID Screening:    Have You Ever Felt You Ought to Cut Down on Your Drinking or Drug Use?: Yes Have People Annoyed You By [employer] Your Drinking Or Drug Use?: No Have You Felt Bad Or Guilty About Your Drinking Or Drug Use?: No Have You Ever Had a Drink or Used Drugs First Thing In The Morning to Steady Your Nerves or to Get Rid of a Hangover?: No CAGE-AID Score: 1  Substance Abuse Education Offered: Yes  Substance abuse interventions: Transport planner

## 2022-04-14 NOTE — ED Triage Notes (Signed)
The pt was sent here for iv fluids  he was seen by his doctor several days ago and had lab work drawn.  They were called from the doctors office today and was told to come to the ed  he also hes a swollen rt foot

## 2022-04-14 NOTE — Progress Notes (Signed)
Transition of Care [facility]) - Emergency Department Mini Assessment   Patient Details  Name: Ronald [name] MRN: [id number] Date of Birth: 1956/06/22  Transition of Care [facility]) CM/SW Contact:    Kaleea Penner C Tarpley-Carter, LCSWA Phone Number: 04/14/2022, 11:26 PM   Clinical Narrative: [facility] CSW consulted with pt and used Cage-Aid to assess.  Pt stated he has been drinking since age 66 and continues to drink ETOH.  Pt stated he knows ETOH is affecting his health, but it makes him feel good.  Pt stated he would accept resources and attempt to seek help.  Valrie Jia Tarpley-Carter, MSW, LCSW-A Pronouns:  She/Her/Hers [facility] Clinical Social Worker Direct Number:  [phone] [email or web address]  ED Mini Assessment: What brought you to the Emergency Department? : Swollen Foot  Barriers to Discharge: No Barriers Identified     Means of departure: Not know  Interventions which prevented an admission or readmission: SUD counseling    Patient Contact and Communications       Contact Date: 04/14/22,              Choice offered to / list presented to : NA  Admission diagnosis:  Weaknes; Abnormal Labs Patient Active Problem List   Diagnosis Date Noted   Hyponatremia 03/26/2019   Altered mental status 03/26/2019   Acute hypoxemic respiratory failure (HCC) 03/26/2019   Dysphagia 03/26/2019   Acute encephalopathy 03/18/2019   COPD exacerbation (HCC) 08/29/2017   Subdural hematoma (HCC) 06/19/2014   Critical lower limb ischemia (HCC) 12/25/2013   Osteomyelitis (HCC) 11/06/2013   HTN (hypertension), benign 11/06/2013   Mild alcohol use disorder 11/06/2013   Osteomyelitis of left foot (HCC) 11/06/2013   COPD (chronic obstructive pulmonary disease) (HCC) 11/06/2013   Accelerated hypertension 09/25/2013   Hypokalemia 09/25/2013   Abscess of left foot 09/21/2013   Cellulitis of left foot 09/19/2013   PCP:  Gwenyth Bender, MD Pharmacy:    [facility] [id number] [place], [place] - [address] [place] [address] Phone: [phone] Fax: [phone]

## 2022-04-14 NOTE — ED Notes (Signed)
This nurse tech walked in on pt and pt family member yelling at each other. The pt had asked that he be left alone in the room and the family member was refusing to leave. Nurse tech broke up the dispute and family member has left bedside stating that the pt was "on drugs." Will continue to monitor.

## 2022-04-14 NOTE — ED Provider Triage Note (Signed)
Emergency Medicine Provider Triage Evaluation Note  Ronald [name] , a 66 y.o. male  was evaluated in triage.  Pt complains of abnormal labs. The patient was seen on 04/08/22 at PCP office. He was called today to come to the ER for abnormal labs.   Review of Systems  Positive:  Negative:   Physical Exam  BP (!) 90/56 (BP Location: Left Arm)   Pulse 88   Temp 98 F (36.7 C) (Oral)   Resp 16   Ht 5\' 4"  (1.626 m)   Wt 72.6 kg   SpO2 91%   BMI 27.47 kg/m  Gen:   Awake, no distress, agitated.   Resp:  Normal effort  MSK:   Moves extremities without difficulty  Other:  Dopplerable pulse  Medical Decision Making  Medically screening exam initiated at 4:35 PM.  Appropriate orders placed.  Gentile was informed that the remainder of the evaluation will be completed by another provider, this initial triage assessment does not replace that evaluation, and the importance of remaining in the ED until their evaluation is complete.  Patient is hypotensive. Labs show sodium of 120. Patient is alcoholic drinking over a 15 pack of beer a day. 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 Nursing aware that patient needs a room now.    Uvaldo Rising, Achille Rich 04/14/22 [phone]

## 2022-04-14 NOTE — ED Provider Notes (Signed)
[facility] EMERGENCY DEPARTMENT Provider Note   CSN: [id number] Arrival date & time: 04/14/22  1535     History {Add pertinent medical, surgical, social history, OB history to HPI:1} No chief complaint on file.   Ronald [name] is a 66 y.o. male.  66 year old male with a history of COPD on home oxygen and alcohol abuse (16 pack of beer per day) who presents to the emergency department with abnormal labs.  Patient states that he went to a routine appointment this week and had lab work that was drawn.  He was called by somebody from his doctor's office but is unsure who untowardly come to the emergency department due to severely abnormal labs.  Says that he is not sure which labs they were but thinks it may be related to his liver or kidney.  States that he has had a mild productive cough recently but otherwise has been doing well.  Says that he sometimes get this with his COPD.  Says that he drinks alcohol daily typically a 16 pack of beer per day.  Thinks that his last drink was yesterday but is unsure what time.  Does have a history of alcohol withdrawal in the past with possible seizures.        Home Medications Prior to Admission medications   Medication Sig Start Date End Date Taking? Authorizing Provider  ADVAIR HFA 230-21 MCG/ACT inhaler Inhale 2 puffs into the lungs 2 (two) times daily. 04/07/22  Yes [provider]  albuterol (VENTOLIN HFA) 108 (90 Base) MCG/ACT inhaler Inhale 2 puffs into the lungs every 4 (four) hours as needed for wheezing or shortness of breath. 10/18/20  Yes Tilden Fossa, MD  amLODipine (NORVASC) 10 MG tablet Take 10 mg by mouth daily.   Yes [provider]  ibuprofen (ADVIL) 200 MG tablet Take 200 mg by mouth every 6 (six) hours as needed for mild pain.   Yes [provider]  lisinopril (ZESTRIL) 10 MG tablet Take 1 tablet (10 mg total) by mouth daily. 03/26/19 04/14/22 Yes Pahwani, Daleen Bo, MD  amLODipine  (NORVASC) 5 MG tablet Take 1 tablet (5 mg total) by mouth daily. Patient not taking: Reported on 04/14/2022 03/26/19 03/25/20  Hughie Closs, MD  folic acid (FOLVITE) 1 MG tablet Take 1 tablet (1 mg total) by mouth daily. Patient not taking: Reported on 03/20/2019 06/19/14   Edsel Petrin, DO  Multiple Vitamin (MULTIVITAMIN WITH MINERALS) TABS tablet Take 1 tablet by mouth daily. Patient not taking: Reported on 03/20/2019 06/19/14   Edsel Petrin, DO  predniSONE (DELTASONE) 50 MG tablet Take 1 tablet (50 mg total) by mouth daily. Patient not taking: Reported on 04/14/2022 02/03/22   Dione Booze, MD  thiamine 100 MG tablet Take 1 tablet (100 mg total) by mouth daily. Patient not taking: Reported on 03/20/2019 06/19/14   Edsel Petrin, DO      Allergies    Patient has no known allergies.    Review of Systems   Review of Systems  Physical Exam Updated Vital Signs BP 121/63   Pulse (!) 108   Temp 98.3 F (36.8 C) (Oral)   Resp 17   Ht 5\' 4"  (1.626 m)   Wt 72.6 kg   SpO2 95%   BMI 27.47 kg/m  Physical Exam  ED Results / Procedures / Treatments   Labs (all labs ordered are listed, but only abnormal results are displayed) Labs Reviewed  COMPREHENSIVE METABOLIC PANEL - Abnormal; Notable for the following components:  Result Value   Sodium 120 (*)    Chloride 83 (*)    BUN 5 (*)    Calcium 8.6 (*)    All other components within normal limits  CBC WITH DIFFERENTIAL/PLATELET - Abnormal; Notable for the following components:   RBC 3.38 (*)    Hemoglobin 11.6 (*)    HCT 31.3 (*)    MCH 34.3 (*)    MCHC 37.1 (*)    Abs Immature Granulocytes 0.10 (*)    All other components within normal limits  URINALYSIS, ROUTINE W REFLEX MICROSCOPIC - Abnormal; Notable for the following components:   Specific Gravity, Urine 1.003 (*)    Ketones, ur 5 (*)    All other components within normal limits  ETHANOL    EKG None  Radiology No results found.  Procedures Procedures  {Document  cardiac monitor, telemetry assessment procedure when appropriate:1}  Medications Ordered in ED Medications  LORazepam (ATIVAN) tablet 1-4 mg (has no administration in time range)    Or  LORazepam (ATIVAN) injection 1-4 mg (has no administration in time range)  thiamine (VITAMIN B1) tablet 100 mg (has no administration in time range)    Or  thiamine (VITAMIN B1) injection 100 mg (has no administration in time range)  folic acid (FOLVITE) tablet 1 mg (has no administration in time range)  multivitamin with minerals tablet 1 tablet (has no administration in time range)    ED Course/ Medical Decision Making/ A&P                           Medical Decision Making Amount and/or Complexity of Data Reviewed Labs: ordered.  Risk OTC drugs. Prescription drug management.   ***  {Document critical care time when appropriate:1} {Document review of labs and clinical decision tools ie heart score, Chads2Vasc2 etc:1}  {Document your independent review of radiology images, and any outside records:1} {Document your discussion with family members, caretakers, and with consultants:1} {Document social determinants of health affecting pt's care:1} {Document your decision making why or why not admission, treatments were needed:1} Final Clinical Impression(s) / ED Diagnoses Final diagnoses:  None    Rx / DC Orders ED Discharge Orders     None

## 2022-04-14 NOTE — ED Notes (Signed)
Patients family is leaving for the night. Aram Beecham would like a call with any updates and admission information. Her number is in the chart.

## 2022-04-15 ENCOUNTER — Encounter (HOSPITAL_COMMUNITY): Payer: Self-pay | Admitting: Internal Medicine

## 2022-04-15 DIAGNOSIS — I959 Hypotension, unspecified: Secondary | ICD-10-CM | POA: Diagnosis not present

## 2022-04-15 DIAGNOSIS — I10 Essential (primary) hypertension: Secondary | ICD-10-CM

## 2022-04-15 DIAGNOSIS — E8779 Other fluid overload: Secondary | ICD-10-CM | POA: Diagnosis not present

## 2022-04-15 DIAGNOSIS — F17210 Nicotine dependence, cigarettes, uncomplicated: Secondary | ICD-10-CM | POA: Diagnosis not present

## 2022-04-15 DIAGNOSIS — Z89512 Acquired absence of left leg below knee: Secondary | ICD-10-CM | POA: Diagnosis not present

## 2022-04-15 DIAGNOSIS — E871 Hypo-osmolality and hyponatremia: Secondary | ICD-10-CM

## 2022-04-15 DIAGNOSIS — Z9981 Dependence on supplemental oxygen: Secondary | ICD-10-CM | POA: Diagnosis not present

## 2022-04-15 DIAGNOSIS — M109 Gout, unspecified: Secondary | ICD-10-CM | POA: Diagnosis not present

## 2022-04-15 DIAGNOSIS — Y900 Blood alcohol level of less than 20 mg/100 ml: Secondary | ICD-10-CM | POA: Diagnosis present

## 2022-04-15 DIAGNOSIS — Z79899 Other long term (current) drug therapy: Secondary | ICD-10-CM | POA: Diagnosis not present

## 2022-04-15 DIAGNOSIS — F1010 Alcohol abuse, uncomplicated: Secondary | ICD-10-CM | POA: Diagnosis not present

## 2022-04-15 DIAGNOSIS — J449 Chronic obstructive pulmonary disease, unspecified: Secondary | ICD-10-CM

## 2022-04-15 LAB — BASIC METABOLIC PANEL
Anion gap: 13 (ref 5–15)
BUN: 5 mg/dL — ABNORMAL LOW (ref 8–23)
CO2: 25 mmol/L (ref 22–32)
Calcium: 9 mg/dL (ref 8.9–10.3)
Chloride: 87 mmol/L — ABNORMAL LOW (ref 98–111)
Creatinine, Ser: 0.64 mg/dL (ref 0.61–1.24)
GFR, Estimated: >60 mL/min (ref >60)
Glucose, Bld: 78 mg/dL (ref 70–99)
Potassium: 3.7 mmol/L (ref 3.5–5.1)
Sodium: 125 mmol/L — ABNORMAL LOW (ref 135–145)

## 2022-04-15 LAB — HIV ANTIBODY (ROUTINE TESTING W REFLEX): HIV Screen 4th Generation wRfx: NONREACTIVE

## 2022-04-15 LAB — ETHANOL: Alcohol, Ethyl (B): <10 mg/dL (ref <10)

## 2022-04-15 MED ORDER — ALBUTEROL SULFATE (2.5 MG/3ML) 0.083% IN NEBU
2.5000 mg | INHALATION_SOLUTION | RESPIRATORY_TRACT | Status: DC | PRN
Start: 2022-04-15 — End: 2022-04-15

## 2022-04-15 MED ORDER — ONDANSETRON HCL 4 MG PO TABS: 4.0000 mg | ORAL_TABLET | Freq: Four times a day (QID) | ORAL | Status: DC | PRN

## 2022-04-15 MED ORDER — ONDANSETRON HCL 4 MG/2ML IJ SOLN: 4.0000 mg | Freq: Four times a day (QID) | INTRAMUSCULAR | Status: DC | PRN

## 2022-04-15 MED ORDER — SODIUM CHLORIDE 0.9 % IV BOLUS
1000.0000 mL | Freq: Once | INTRAVENOUS | Status: AC
  Administered 2022-04-15: 1000 mL via INTRAVENOUS

## 2022-04-15 MED ORDER — ENOXAPARIN SODIUM 40 MG/0.4ML IJ SOSY
40.0000 mg | PREFILLED_SYRINGE | INTRAMUSCULAR | Status: DC
  Administered 2022-04-15 – 2022-04-16 (×2): 40 mg via SUBCUTANEOUS
  Filled 2022-04-15 (×2): qty 0.4

## 2022-04-15 MED ORDER — IPRATROPIUM-ALBUTEROL 0.5-2.5 (3) MG/3ML IN SOLN: 3.0000 mL | Freq: Four times a day (QID) | RESPIRATORY_TRACT | Status: DC | PRN

## 2022-04-15 MED ORDER — ACETAMINOPHEN 325 MG PO TABS: 650.0000 mg | ORAL_TABLET | Freq: Four times a day (QID) | ORAL | Status: DC | PRN

## 2022-04-15 MED ORDER — GUAIFENESIN ER 600 MG PO TB12
600.0000 mg | ORAL_TABLET | Freq: Two times a day (BID) | ORAL | Status: DC
  Administered 2022-04-15 – 2022-04-16 (×2): 600 mg via ORAL
  Filled 2022-04-15 (×2): qty 1

## 2022-04-15 MED ORDER — SODIUM CHLORIDE 0.9 % IV SOLN: INTRAVENOUS | Status: DC

## 2022-04-15 MED ORDER — AMLODIPINE BESYLATE 10 MG PO TABS
10.0000 mg | ORAL_TABLET | Freq: Every day | ORAL | Status: DC
  Administered 2022-04-15: 10 mg via ORAL
  Filled 2022-04-15: qty 1

## 2022-04-15 MED ORDER — LISINOPRIL 10 MG PO TABS
10.0000 mg | ORAL_TABLET | Freq: Every day | ORAL | Status: DC
  Administered 2022-04-15: 10 mg via ORAL
  Filled 2022-04-15: qty 1

## 2022-04-15 MED ORDER — ACETAMINOPHEN 650 MG RE SUPP: 650.0000 mg | Freq: Four times a day (QID) | RECTAL | Status: DC | PRN

## 2022-04-15 MED ORDER — MOMETASONE FURO-FORMOTEROL FUM 200-5 MCG/ACT IN AERO
2.0000 | INHALATION_SPRAY | Freq: Two times a day (BID) | RESPIRATORY_TRACT | Status: DC
  Administered 2022-04-15 – 2022-04-16 (×2): 2 via RESPIRATORY_TRACT
  Filled 2022-04-15: qty 8.8

## 2022-04-15 NOTE — Evaluation (Addendum)
Physical Therapy Evaluation Patient Details Name: Ronald [name] MRN: [id number] DOB: 20-Feb-1956 Today's Date: 04/15/2022  History of Present Illness  Pt is a 66 y.o. male with medical history significant of EtOH and substance abuse, COPD, L BKA. Pt sent in by PCP for lab abnormalities. Exact abnormalities unclear; however, pt found to have sodium of 120 in ED. Pt admits to ongoing drinking of EtOH in form of Beer only. Rarely drinks hard liquor. But admits to drinking excessive amounts of beer. Pt not interested in quitting drinking, but does want to stay the night.  Clinical Impression  Pt agreeable to physical therapy evaluation/treatment session. Pt limited historian. Pt's lethargy decreased his participation today. Pt required minimal to moderate assistance for bed mobility initially but later on pt was able to perform same tasks without any physical assistance. Pt required maximal assistance to obtain standing and bed to chair transfer was deferred. Pt currently presents with functional limitations secondary to impairments listed in PT problem list. Pt to benefit from skilled, acute care physical therapy interventions to maximize his strength, transfer abilities, independence level, and quality of life.       Recommendations for follow up therapy are one component of a multi-disciplinary discharge planning process, led by the attending physician.  Recommendations may be updated based on patient status, additional functional criteria and insurance authorization.  Follow Up Recommendations Home health PT      Assistance Recommended at Discharge Frequent or constant Supervision/Assistance  Patient can return home with the following  A lot of help with walking and/or transfers;A lot of help with bathing/dressing/bathroom;Assistance with cooking/housework;Assist for transportation;Help with stairs or ramp for entrance    Equipment Recommendations Other (comment) (pt declines BSC)   Recommendations for Other Services       Functional Status Assessment Patient has had a recent decline in their functional status and demonstrates the ability to make significant improvements in function in a reasonable and predictable amount of time.     Precautions / Restrictions Precautions Precautions: Fall Precaution Comments: L BKA at baseline Restrictions Weight Bearing Restrictions: No      Mobility  Bed Mobility Overal bed mobility: Needs Assistance Bed Mobility: Supine to Sit, Sit to Supine     Supine to sit: Mod assist, HOB elevated, Supervision Sit to supine: Min assist, Supervision   General bed mobility comments: Pt performed two supine <> sit transfers during this encounter (once with physical assist from therapist and then when therapist left room as bed alarm going off as pt trying to get EOB to urinate).    Transfers Overall transfer level: Needs assistance Equipment used: 2 person hand held assist Transfers: Sit to/from Stand Sit to Stand: Max assist, From elevated surface           General transfer comment: Pt held therapist's elbows for sit to stand and declined using walker. Pt unsteady on feet when attempting pivot towards bedside chair; transfer to chair deferred as total assistance likely required.    Ambulation/Gait               General Gait Details: unable  Stairs            Wheelchair Mobility    Modified Rankin (Stroke Patients Only)       Balance Overall balance assessment: Needs assistance Sitting-balance support: No upper extremity supported Sitting balance-Leahy Scale: Fair     Standing balance support: Bilateral upper extremity supported Standing balance-Leahy Scale: Zero  Pertinent Vitals/Pain Pain Assessment Pain Assessment: 0-10 Pain Score: 7  Pain Location: R knee Pain Descriptors / Indicators: Dull Pain Intervention(s): Limited activity within patient's  tolerance, Monitored during session    Home Living Family/patient expects to be discharged to:: Private residence Living Arrangements: Non-relatives/Friends (Lives with male friend) Available Help at Discharge: Available 24 hours/day Type of Home: House Home Access: Ramped entrance       Home Layout: One level Home Equipment: Wheelchair - manual;Shower seat Additional Comments: Pt limited historian 2/2 lethargy    Prior Function Prior Level of Function : History of Falls (last six months) (Pt does not drive; pt disabled.)             Mobility Comments: Pt states he relies on WC for mobility and does not use his prothesis. Pt reports ability to complete transfers without assistance but unable to state transfer technique. ADLs Comments: Pt's roommate takes care of his errands, laundry, and cooking. Pt states he is independent with basic ADL's.     Hand Dominance   Dominant Hand: Right    Extremity/Trunk Assessment   Upper Extremity Assessment Upper Extremity Assessment: Generalized weakness    Lower Extremity Assessment Lower Extremity Assessment: Generalized weakness       Communication   Communication: No difficulties  Cognition Arousal/Alertness: Lethargic (more alert once sitting EOB) Behavior During Therapy: Flat affect Overall Cognitive Status: Difficult to assess                                 General Comments: Pt A and O x 4; pt also oriented to month but not day of week. Pt with slow processing and decreased safety awareness.        General Comments General comments (skin integrity, edema, etc.): HR and SpO2 stable on RA    Exercises     Assessment/Plan    PT Assessment Patient needs continued PT services  PT Problem List Decreased strength;Decreased activity tolerance;Decreased balance;Decreased mobility;Decreased knowledge of use of DME;Decreased safety awareness;Decreased knowledge of precautions;Pain       PT Treatment  Interventions DME instruction;Gait training;Functional mobility training;Therapeutic activities;Therapeutic exercise;Balance training;Neuromuscular re-education;Patient/family education;Wheelchair mobility training    PT Goals (Current goals can be found in the Care Plan section)  Acute Rehab PT Goals Patient Stated Goal: none stated PT Goal Formulation: With patient Time For Goal Achievement: 04/23/22 Potential to Achieve Goals: Fair Additional Goals Additional Goal #1: Pt will propel wheelchair through hallway including turns >/= 200 feet with supervision.    Frequency Min 3X/week     Co-evaluation               AM-PAC PT "6 Clicks" Mobility  Outcome Measure Help needed turning from your back to your side while in a flat bed without using bedrails?: A Little Help needed moving from lying on your back to sitting on the side of a flat bed without using bedrails?: A Little Help needed moving to and from a bed to a chair (including a wheelchair)?: Total Help needed standing up from a chair using your arms (e.g., wheelchair or bedside chair)?: A Lot Help needed to walk in hospital room?: Total Help needed climbing 3-5 steps with a railing? : Total 6 Click Score: 11    End of Session Equipment Utilized During Treatment: Gait belt Activity Tolerance: Patient limited by lethargy;Other (comment) (limited by weakness) Patient left: in bed;with call bell/phone within reach;with bed alarm  set Nurse Communication: Mobility status PT Visit Diagnosis: Other abnormalities of gait and mobility (R26.89);History of falling (Z91.81);Muscle weakness (generalized) (M62.81);Pain Pain - Right/Left: Right Pain - part of body: Knee    Time: 1242-1315 PT Time Calculation (min) (ACUTE ONLY): 33 min   Charges:   PT Evaluation $PT Eval Low Complexity: 1 Low PT Treatments $Therapeutic Activity: 8-22 mins        Tana Coast, PT   [employer] 04/15/2022, 1:47 PM

## 2022-04-15 NOTE — Progress Notes (Addendum)
Brief same day note:  Patient is a 77 male with history of alcohol abuse, substance abuse, left BKA who was sent from PCP office for the evaluation of low sodium.  Patient has history of chronic alcohol abuse, drinks beer.  On presentation sodium was in the range of 120, urine sodium less than 10.  Patient was started on IV fluids. Started on CIWA.  Patient seen and examined at the bedside this morning.  Looks a little confused to me today.  Likely given Ativan at night.  Looks very deconditioned.  Not in any distress.  Brief assessment and plan:  Hyponatremia: Likely secondary to beer potomania, lack of solute intake.  Sodium improved to 125 from 120.  Continue gentle IV fluids.  Chronic alcohol abuse: Continue thiamine folic acid.  On CIWA monitoring.  Consult for cessation.  TOC consulted  COPD: Coughing but without wheezing.  Continue nebulizing treatment, not on oxygen at home, currently on room air.  Continue cough medications  Hypertension: On amlodipine, lisinopril.  Currently blood pressure stable  Deconditioning/debility: Status post left BKA.  PT/OT consulted

## 2022-04-15 NOTE — Plan of Care (Signed)
  Problem: Health Behavior/Discharge Planning: Goal: Ability to manage health-related needs will improve Outcome: Progressing   

## 2022-04-15 NOTE — H&P (Signed)
History and Physical    Patient: Ronald [name]:[id number] DOB: April 27, 1956 DOA: 04/14/2022 DOS: the patient was seen and examined on 04/15/2022 PCP: Gwenyth Bender, MD  Patient coming from: Home  Chief Complaint: Hyponatremia  HPI: Ronald is a 66 y.o. male with medical history significant of EtOH and substance abuse, L BKA.  Pt sent in by PCP for lab abnormalities.  Exact abnormalities unclear; however, pt found to have sodium of 120 in ED.  Pt admits to ongoing drinking of EtOH in form of Beer only.  Rarely drinks hard liquor.  But admits to drinking excessive amounts of beer.  Pt not interested in quitting drinking, but does want to stay the night.   Review of Systems: As mentioned in the history of present illness. All other systems reviewed and are negative. Past Medical History:  Diagnosis Date   Critical lower limb ischemia (HCC)    ETOH abuse    Gout    Hypertension    Past Surgical History:  Procedure Laterality Date   ABDOMINAL AORTAGRAM N/A 12/26/2013   Procedure: ABDOMINAL Ronny Flurry;  Surgeon: [name], MD;  Location: [facility] CATH LAB;  Service: Cardiovascular;  Laterality: N/A;   AMPUTATION Left 12/28/2013   Procedure: LEFT BELOW KNEE AMPUTATION;  Surgeon: Nadara Mustard, MD;  Location: [facility] OR;  Service: Orthopedics;  Laterality: Left;   BELOW KNEE LEG AMPUTATION     Left   LEG SURGERY     Social History:  reports that he has been smoking cigarettes. He has a 20.00 pack-year smoking history. He has never used smokeless tobacco. He reports current alcohol use of about 120.0 standard drinks of alcohol per week. He reports current drug use. Drugs: Cocaine and Marijuana.  No Known Allergies  Family History  Problem Relation Age of Onset   Diabetes Mellitus II Mother    CAD Neg Hx    Stroke Neg Hx     Prior to Admission medications   Medication Sig Start Date End Date Taking? Authorizing Provider  ADVAIR HFA 230-21 MCG/ACT inhaler Inhale 2 puffs into the  lungs 2 (two) times daily. 04/07/22  Yes [provider]  albuterol (VENTOLIN HFA) 108 (90 Base) MCG/ACT inhaler Inhale 2 puffs into the lungs every 4 (four) hours as needed for wheezing or shortness of breath. 10/18/20  Yes Tilden Fossa, MD  amLODipine (NORVASC) 10 MG tablet Take 10 mg by mouth daily.   Yes [provider]  ibuprofen (ADVIL) 200 MG tablet Take 200 mg by mouth every 6 (six) hours as needed for mild pain.   Yes [provider]  lisinopril (ZESTRIL) 10 MG tablet Take 1 tablet (10 mg total) by mouth daily. 03/26/19 04/14/22 Yes Pahwani, Daleen Bo, MD  amLODipine (NORVASC) 5 MG tablet Take 1 tablet (5 mg total) by mouth daily. Patient not taking: Reported on 04/14/2022 03/26/19 03/25/20  Hughie Closs, MD    Physical Exam: Vitals:   04/14/22 2154 04/14/22 2200 04/14/22 2340 04/15/22 0011  BP:  (!) 109/55 (!) 153/86 (!) 153/86  Pulse:  (!) 102 (!) 105 (!) 105  Resp:  13 16   Temp:      TempSrc:      SpO2: 100% 100% 95%   Weight:      Height:       Constitutional: NAD, calm, comfortable Eyes: PERRL, lids and conjunctivae normal ENMT: Mucous membranes are moist. Posterior pharynx clear of any exudate or lesions.Normal dentition.  Neck: normal, supple, no masses, no thyromegaly  Respiratory: clear to auscultation bilaterally, no wheezing, no crackles. Normal respiratory effort. No accessory muscle use.  Cardiovascular: Tachycardic Abdomen: no tenderness, no masses palpated. No hepatosplenomegaly. Bowel sounds positive.  Musculoskeletal: no clubbing / cyanosis. No joint deformity upper and lower extremities. Good ROM, no contractures. Normal muscle tone.  Skin: no rashes, lesions, ulcers. No induration Neurologic: CN 2-12 grossly intact. Sensation intact, DTR normal. Strength 5/5 in all 4. Occasional tremor. Psychiatric: Normal judgment and insight. Alert and oriented x 3. Normal mood.   Data Reviewed:       Latest Ref Rng & Units 04/14/2022    7:44 PM  02/03/2022   12:26 AM 02/03/2022   12:13 AM  BMP  Glucose 70 - 99 mg/dL 78   272   BUN 8 - 23 mg/dL 5   <5   Creatinine 5.36 - 1.24 mg/dL 6.44   0.34   Sodium 742 - 145 mmol/L 120  127  129   Potassium 3.5 - 5.1 mmol/L 3.7  3.7  3.7   Chloride 98 - 111 mmol/L 83   92   CO2 22 - 32 mmol/L 24   23   Calcium 8.9 - 10.3 mg/dL 8.6   8.5    Urine sodium < 10 Urine osm 120.  Assessment and Plan: * Hyponatremia Acute on chronic hyponatremia, likely due to water intoxication / beer potomania. Urine sodium of < 10 and urine osm low at 120 not consistent with SIADH. suggests to me that kidney is, appropriately, urinating out mostly free water Would expect to see serum sodium rise from this alone. Overall presentation is more consistent with too much water intake due to drinking beer (which pt admits to). Got normal saline in ED About to eat dinner which includes french fries (high salt) Repeat BMP in AM Add free water restriction if needed at that time, but I suspect sodium will improve just with salt PO intake and not drinking large quantities of free water (in form of beer).  ETOH abuse CIWA for withdrawal symptoms.  COPD (chronic obstructive pulmonary disease) (HCC) Cont home nebs Not in exacerbation.  HTN (hypertension), benign Cont home BP meds, no longer on thiazide diuretic for a couple of years now.      Advance Care Planning:   Code Status: Full Code  Consults: None  Family Communication: No family in room (had family argument earlier during stay it seems).  Severity of Illness: The appropriate patient status for this patient is OBSERVATION. Observation status is judged to be reasonable and necessary in order to provide the required intensity of service to ensure the patient's safety. The patient's presenting symptoms, physical exam findings, and initial radiographic and laboratory data in the context of their medical condition is felt to place them at decreased risk for  further clinical deterioration. Furthermore, it is anticipated that the patient will be medically stable for discharge from the hospital within 2 midnights of admission.   Author: Hillary Bow., DO 04/15/2022 12:48 AM  For on call review [email or web address].

## 2022-04-15 NOTE — TOC CAGE-AID Note (Addendum)
Transition of Care [facility]) - CAGE-AID Screening   Patient Details  Name: [name] MRN: [id number] Date of Birth: 12/07/1955  Transition of [facility]) CM/SW Contact:    Kingsley Plan, RN Phone Number: 04/15/2022, 3:00 PM   Clinical Narrative:    CAGE-AID Screening:    Have You Ever Felt You Ought to Cut Down on Your Drinking or Drug Use?: No Have People Annoyed You By Critizing Your Drinking Or Drug Use?: No Have You Felt Bad Or Guilty About Your Drinking Or Drug Use?: No Have You Ever Had a Drink or Used Drugs First Thing In The Morning to Steady Your Nerves or to Get Rid of a Hangover?: Yes CAGE-AID Score: 1  Substance Abuse Education Offered: Yes  Substance abuse interventions: Patient Counseling Patient states he "drinks quite a bit" , but has not used Cocaine and Marijuana for 2 to 3 months  Resources given

## 2022-04-15 NOTE — Plan of Care (Signed)

## 2022-04-15 NOTE — Assessment & Plan Note (Signed)
Cont home nebs Not in exacerbation.

## 2022-04-15 NOTE — Assessment & Plan Note (Signed)
CIWA for withdrawal symptoms.

## 2022-04-15 NOTE — Assessment & Plan Note (Signed)
Cont home BP meds, no longer on thiazide diuretic for a couple of years now.

## 2022-04-15 NOTE — TOC Initial Note (Signed)
Transition of Care [id number]) - Initial/Assessment Note    Patient Details  Name: [name] MRN: [id number] Date of Birth: 04-24-1956  Transition of Care [facility]) CM/SW Contact:    Kingsley Plan, RN Phone Number: 04/15/2022, 3:11 PM  Clinical Narrative:                 Spoke to patient at bedside.   PCP Willey Blade  Confirmed face sheet information.  Patient lives with "lady friend".  Sister will provide transportation home.   Patient now agreeable to 3 in 1 ,ordered with [facility].   Patient agreeable to home health services. No preference. Referral accepted by Eber Jones with [facility].   Expected Discharge Plan: Home w Home Health Services Barriers to Discharge: Continued Medical Work up   Patient Goals and CMS Choice Patient states their goals for this hospitalization and ongoing recovery are:: to return to home CMS Medicare.gov Compare Post Acute Care list provided to:: Patient Choice offered to / list presented to : Patient  Expected Discharge Plan and Services Expected Discharge Plan: Home w Home Health Services   Discharge Planning Services: CM Consult Post Acute Care Choice: Home Health Living arrangements for the past 2 months: Single Family Home                 DME Arranged: 3-N-1 DME Agency: [facility] Date DME Agency Contacted: 04/15/22 Time DME Agency Contacted: [phone] Representative spoke with at DME Agency: Beola Cord HH Arranged: PT, OT HH Agency: Other - See comment ([facility]) Date HH Agency Contacted: 04/15/22 Time HH Agency Contacted: 1510 Representative spoke with at [facility] Agency: Eber Jones  Prior Living Arrangements/Services Living arrangements for the past 2 months: Single Family Home Lives with:: Roommate Patient language and need for interpreter reviewed:: Yes Do you feel safe going back to the place where you live?: Yes      Need for Family Participation in Patient Care: Yes (Comment) Care giver support system in place?: Yes  (comment)   Criminal Activity/Legal Involvement Pertinent to Current Situation/Hospitalization: No - Comment as needed  Activities of Daily Living Home Assistive Devices/Equipment: Wheelchair ADL Screening (condition at time of admission) Patient's cognitive ability adequate to safely complete daily activities?: Yes Is the patient deaf or have difficulty hearing?: No Does the patient have difficulty seeing, even when wearing glasses/contacts?: No Does the patient have difficulty concentrating, remembering, or making decisions?: No Patient able to express need for assistance with ADLs?: Yes Does the patient have difficulty dressing or bathing?: No Independently performs ADLs?: Yes (appropriate for developmental age) Does the patient have difficulty walking or climbing stairs?: Yes Weakness of Legs: None Weakness of Arms/Hands: None  Permission Sought/Granted   Permission granted to share information with : No              Emotional Assessment Appearance:: Appears stated age Attitude/Demeanor/Rapport: Engaged Affect (typically observed): Accepting Orientation: : Oriented to Self, Oriented to Place, Oriented to  Time, Oriented to Situation Alcohol / Substance Use: Not Applicable Psych Involvement: No (comment)  Admission diagnosis:  Hyponatremia [E87.1] ETOH abuse [F10.10] Patient Active Problem List   Diagnosis Date Noted   Hyponatremia 03/26/2019   Altered mental status 03/26/2019   Acute hypoxemic respiratory failure (HCC) 03/26/2019   Dysphagia 03/26/2019   Acute encephalopathy 03/18/2019   COPD exacerbation (HCC) 08/29/2017   Subdural hematoma (HCC) 06/19/2014   Critical lower limb ischemia (HCC) 12/25/2013   Osteomyelitis (HCC) 11/06/2013   HTN (hypertension), benign 11/06/2013  ETOH abuse 11/06/2013   Osteomyelitis of left foot (HCC) 11/06/2013   COPD (chronic obstructive pulmonary disease) (HCC) 11/06/2013   Accelerated hypertension 09/25/2013   Hypokalemia  09/25/2013   Abscess of left foot 09/21/2013   Cellulitis of left foot 09/19/2013   PCP:  Gwenyth Bender, MD Pharmacy:   [facility] [phone] Gin[place] Otto, [place] - [address] [place] [address] Phone: [phone] Fax: [phone]     Social Determinants of Health (SDOH) Interventions    Readmission Risk Interventions     No data to display

## 2022-04-15 NOTE — Progress Notes (Signed)
Mobility Specialist Progress Note:   04/15/22 0915  Mobility  Activity Turned to right side;Turned to left side;Turned to back - supine  Level of Assistance Standby assist, set-up cues, supervision of patient - no hands on  Assistive Device None  Activity Response Tolerated fair  $Mobility charge 1 Mobility   Pt agreeable to mobility however limited by fatigue. Pt performed light bed mobility and exercises. Will f/u later today for hopeful OOB mobility. Pt left with all needs met.   Nelta Numbers Acute Rehab Secure Chat or Office Phone: [phone]

## 2022-04-15 NOTE — Assessment & Plan Note (Addendum)
Acute on chronic hyponatremia, likely due to water intoxication / beer potomania. 1. Urine sodium of < 10 and urine osm low at 120 1. not consistent with SIADH. 2. suggests to me that kidney is, appropriately, urinating out mostly free water 3. Would expect to see serum sodium rise from this alone. 2. Overall presentation is more consistent with too much water intake due to drinking beer (which pt admits to). 3. Got normal saline in ED 4. About to eat dinner which includes french fries (high salt) 5. Repeat BMP in AM 6. Add free water restriction if needed at that time, but I suspect sodium will improve just with salt PO intake and not drinking large quantities of free water (in form of beer).

## 2022-04-16 ENCOUNTER — Other Ambulatory Visit (HOSPITAL_COMMUNITY): Payer: Self-pay

## 2022-04-16 DIAGNOSIS — E871 Hypo-osmolality and hyponatremia: Secondary | ICD-10-CM | POA: Diagnosis not present

## 2022-04-16 LAB — BASIC METABOLIC PANEL
Anion gap: 9 (ref 5–15)
BUN: 5 mg/dL — ABNORMAL LOW (ref 8–23)
CO2: 25 mmol/L (ref 22–32)
Calcium: 8.5 mg/dL — ABNORMAL LOW (ref 8.9–10.3)
Chloride: 94 mmol/L — ABNORMAL LOW (ref 98–111)
Creatinine, Ser: 0.56 mg/dL — ABNORMAL LOW (ref 0.61–1.24)
GFR, Estimated: >60 mL/min (ref >60)
Glucose, Bld: 93 mg/dL (ref 70–99)
Potassium: 3.7 mmol/L (ref 3.5–5.1)
Sodium: 128 mmol/L — ABNORMAL LOW (ref 135–145)

## 2022-04-16 MED ORDER — ALBUTEROL SULFATE HFA 108 (90 BASE) MCG/ACT IN AERS
2.0000 | INHALATION_SPRAY | RESPIRATORY_TRACT | 1 refills | Status: DC | PRN
Start: 2022-04-16 — End: 2022-09-17
  Filled 2022-04-16: qty 18, 10d supply, fill #0

## 2022-04-16 MED ORDER — ADVAIR HFA 230-21 MCG/ACT IN AERO
2.0000 | INHALATION_SPRAY | Freq: Two times a day (BID) | RESPIRATORY_TRACT | 1 refills | Status: DC
  Filled 2022-04-16: qty 12, fill #0

## 2022-04-16 MED ORDER — FOLIC ACID 1 MG PO TABS
1.0000 mg | ORAL_TABLET | Freq: Every day | ORAL | 1 refills | Status: DC
  Filled 2022-04-16: qty 30, 30d supply, fill #0

## 2022-04-16 MED ORDER — GUAIFENESIN ER 600 MG PO TB12
600.0000 mg | ORAL_TABLET | Freq: Two times a day (BID) | ORAL | 0 refills | Status: AC
  Filled 2022-04-16: qty 14, 7d supply, fill #0

## 2022-04-16 MED ORDER — THIAMINE HCL 100 MG PO TABS
100.0000 mg | ORAL_TABLET | Freq: Every day | ORAL | 1 refills | Status: DC
  Filled 2022-04-16: qty 30, 30d supply, fill #0

## 2022-04-16 NOTE — Discharge Summary (Signed)
Physician Discharge Summary  [facility] BZJ:[id number] DOB: [birth year] DOA: 04/14/2022  PCP: Gwenyth Bender, MD  Admit date: 04/14/2022 Discharge date: 04/16/2022  Admitted From: Home Disposition:  Home  Discharge Condition:Stable CODE STATUS:FULL Diet recommendation: regular  Brief/Interim Summary: Patient is a 20 male with history of alcohol abuse, substance abuse, left BKA who was sent from PCP office for the evaluation of low sodium.  Patient has history of chronic alcohol abuse, drinks beer.  On presentation sodium was in the range of 120, urine sodium less than 10.  Beer potomania suspected.  Patient was started on IV fluids.  Sodium level improved to the range of 128 today.  He remains comfortable, hemodynamically stable.  PT recommended home with on discharge.  Medically stable for discharge today.   Following problems were addressed during his hospitalization :  Hyponatremia: Likely secondary to beer potomania, lack of solute intake.  Sodium improved to 128 from 120. Check BMP in a week   Chronic alcohol abuse: Continue thiamine ,folic acid.He was   On CIWA monitoring.Counseled for cessation.  TOC consulted for resources   COPD: Coughing but without wheezing.  Continue nebulizing treatment, not on oxygen at home, currently on room air.  Continue cough medications,bronchodilators   Hypertension: On amlodipine, lisinopril.  He was hypotensive on presentation.  Blood pressure medications stopped.  Currently normotensive without any medications  Deconditioning/debility: Status post left BKA.  PT recommended home health   Discharge Diagnoses:  Principal Problem:   Hyponatremia Active Problems:   ETOH abuse   HTN (hypertension), benign   COPD (chronic obstructive pulmonary disease) [facility])    Discharge Instructions  Discharge Instructions     Diet general   Complete by: As directed    Discharge instructions   Complete by: As directed    1)Please quit alcohol 2)Take  prescribed medications as instructed 3)Follow up with your PCP in a week. Do a BMP test during the follow up to check potassium level   Increase activity slowly   Complete by: As directed       Allergies as of 04/16/2022   No Known Allergies      Medication List     STOP taking these medications    amLODipine 10 MG tablet Commonly known as: NORVASC   amLODipine 5 MG tablet Commonly known as: NORVASC   lisinopril 10 MG tablet Commonly known as: ZESTRIL       TAKE these medications    Advair HFA 230-21 MCG/ACT inhaler Generic drug: fluticasone-salmeterol Inhale 2 puffs into the lungs 2 (two) times daily.   Advil 200 MG tablet Generic drug: ibuprofen Take 200 mg by mouth every 6 (six) hours as needed for mild pain.   albuterol 108 (90 Base) MCG/ACT inhaler Commonly known as: VENTOLIN HFA Inhale 2 puffs into the lungs every 4 (four) hours as needed for wheezing or shortness of breath.   folic acid 1 MG tablet Commonly known as: FOLVITE Take 1 tablet (1 mg total) by mouth daily. Start taking on: April 17, 2022   guaiFENesin 600 MG 12 hr tablet Commonly known as: MUCINEX Take 1 tablet (600 mg total) by mouth 2 (two) times daily for 7 days.   thiamine 100 MG tablet Commonly known as: VITAMIN B1 Take 1 tablet (100 mg total) by mouth daily. Start taking on: April 17, 2022               Durable Medical Equipment  (From admission, onward)  Start     Ordered   04/15/22 1514  For home use only DME 3 n 1  Once        04/15/22 1514            Follow-up Information     [facility] Follow up.   Contact information: [address] [place] [address] [phone]         Gwenyth Bender, MD. Schedule an appointment as soon as possible for a visit in 1 week(s).   Specialty: Internal Medicine Contact information: [address] [place] [address] [phone]                No Known  Allergies  Consultations: None   Procedures/Studies: DG Chest 2 View  Result Date: 04/14/2022 CLINICAL DATA:  Cough, shortness of breath. EXAM: CHEST - 2 VIEW COMPARISON:  02/02/2022. FINDINGS: The heart size and mediastinal contours are within normal limits. There is atherosclerotic calcification of the aorta. Mild peribronchial cuffing is noted bilaterally. Mild atelectasis is noted at the left lung base. No consolidation, effusion, or pneumothorax. No acute osseous abnormality. IMPRESSION: Mild peribronchial cuffing bilaterally which may be associated with small airways disease versus bronchitis. Electronically Signed   By: Thornell Sartorius M.D.   On: 04/14/2022 22:25      Subjective: Patient seen and examined at the bedside this morning.  Hemodynamically stable.  Alert and oriented, eating his breakfast.  Not in withdrawal.  Counseled again for alcohol cessation.  Medically stable for discharge.  Discharge Exam: Vitals:   04/16/22 0734 04/16/22 0857  BP: 112/71   Pulse: 93   Resp: 17   Temp: 98 F (36.7 C)   SpO2: 96% 97%   Vitals:   04/15/22 2138 04/16/22 0416 04/16/22 0734 04/16/22 0857  BP: 135/79 119/88 112/71   Pulse: 97 95 93   Resp:  17 17   Temp: 98.8 F (37.1 C)  98 F (36.7 C)   TempSrc: Oral     SpO2: 100% 99% 96% 97%  Weight:      Height:        General: Pt is alert, awake, not in acute distress Cardiovascular: RRR, S1/S2 +, no rubs, no gallops Respiratory: CTA bilaterally, no wheezing, no rhonchi Abdominal: Soft, NT, ND, bowel sounds + Extremities: no edema, no cyanosis    The results of significant diagnostics from this hospitalization (including imaging, microbiology, ancillary and laboratory) are listed below for reference.     Microbiology: No results found for this or any previous visit (from the past 240 hour(s)).   Labs: BNP (last 3 results) Recent Labs    02/03/22 0013  BNP 37.7   Basic Metabolic Panel: Recent Labs  Lab  04/14/22 1944 04/15/22 0140 04/16/22 0114  NA 120* 125* 128*  K 3.7 3.7 3.7  CL 83* 87* 94*  CO2 24 25 25   GLUCOSE 78 78 93  BUN 5* 5* 5*  CREATININE 0.66 0.64 0.56*  CALCIUM 8.6* 9.0 8.5*   Liver Function Tests: Recent Labs  Lab 04/14/22 1944  AST 25  ALT 18  ALKPHOS 61  BILITOT 0.7  PROT 7.4  ALBUMIN 3.9   No results for input(s): "LIPASE", "AMYLASE" in the last 168 hours. No results for input(s): "AMMONIA" in the last 168 hours. CBC: Recent Labs  Lab 04/14/22 1944  WBC 7.0  NEUTROABS 5.2  HGB 11.6*  HCT 31.3*  MCV 92.6  PLT 291   Cardiac Enzymes:  No results for input(s): "CKTOTAL", "CKMB", "CKMBINDEX", "TROPONINI" in the last 168 hours. BNP: Invalid input(s): "POCBNP" CBG: No results for input(s): "GLUCAP" in the last 168 hours. D-Dimer No results for input(s): "DDIMER" in the last 72 hours. Hgb A1c No results for input(s): "HGBA1C" in the last 72 hours. Lipid Profile No results for input(s): "CHOL", "HDL", "LDLCALC", "TRIG", "CHOLHDL", "LDLDIRECT" in the last 72 hours. Thyroid function studies No results for input(s): "TSH", "T4TOTAL", "T3FREE", "THYROIDAB" in the last 72 hours.  Invalid input(s): "FREET3" Anemia work up No results for input(s): "VITAMINB12", "FOLATE", "FERRITIN", "TIBC", "IRON", "RETICCTPCT" in the last 72 hours. Urinalysis    Component Value Date/Time   COLORURINE YELLOW 04/14/2022 1944   APPEARANCEUR CLEAR 04/14/2022 1944   LABSPEC 1.003 (L) 04/14/2022 1944   PHURINE 6.0 04/14/2022 1944   GLUCOSEU NEGATIVE 04/14/2022 1944   HGBUR NEGATIVE 04/14/2022 1944   BILIRUBINUR NEGATIVE 04/14/2022 1944   KETONESUR 5 (A) 04/14/2022 1944   PROTEINUR NEGATIVE 04/14/2022 1944   NITRITE NEGATIVE 04/14/2022 1944   LEUKOCYTESUR NEGATIVE 04/14/2022 1944   Sepsis Labs Recent Labs  Lab 04/14/22 1944  WBC 7.0   Microbiology No results found for this or any previous visit (from the past 240 hour(s)).  Please note: You were cared for by  a hospitalist during your hospital stay. Once you are discharged, your primary care physician will handle any further medical issues. Please note that NO REFILLS for any discharge medications will be authorized once you are discharged, as it is imperative that you return to your primary care physician (or establish a relationship with a primary care physician if you do not have one) for your post hospital discharge needs so that they can reassess your need for medications and monitor your lab values.    Time coordinating discharge: 40 minutes  SIGNED:   Burnadette Pop, MD  [facility] 04/16/2022, 10:27 AM Pager [phone]  If 7PM-7AM, please contact night-coverage [email or web address] Password [email or web address]

## 2022-04-16 NOTE — Progress Notes (Addendum)
Physical Therapy Treatment Patient Details Name: Ronald [name] MRN: [id number] DOB: [birth year] Today's Date: 04/16/2022   History of Present Illness Pt is a 66 y.o. male with medical history significant of EtOH and substance abuse, COPD, L BKA. Pt sent in by PCP for lab abnormalities. Exact abnormalities unclear; however, pt found to have sodium of 120 in ED. Pt admits to ongoing drinking of EtOH in form of Beer only. Rarely drinks hard liquor. But admits to drinking excessive amounts of beer. Pt not interested in quitting drinking, but does want to stay the night.    PT Comments    Pt more alert today and agreeable to participate although he becomes agitated at times. Pt with vast improvements in functional mobility. Pt performed bed mobility at independent level and squat pivot transfer from bed <> wheelchair with CGA. Pt declined propelling himself with wheelchair in hallway. Will progress as able.     Recommendations for follow up therapy are one component of a multi-disciplinary discharge planning process, led by the attending physician.  Recommendations may be updated based on patient status, additional functional criteria and insurance authorization.  Follow Up Recommendations  Home health PT     Assistance Recommended at Discharge PRN  Patient can return home with the following Assistance with cooking/housework;Assist for transportation;Help with stairs or ramp for entrance;A little help with walking and/or transfers;A little help with bathing/dressing/bathroom   Equipment Recommendations  BSC/3in1    Recommendations for Other Services       Precautions / Restrictions Precautions Precautions: Fall Precaution Comments: L BKA at baseline Restrictions Weight Bearing Restrictions: No     Mobility  Bed Mobility Overal bed mobility: Needs Assistance Bed Mobility: Supine to Sit, Sit to Supine     Supine to sit: Independent Sit to supine: Independent         Transfers Overall transfer level: Needs assistance Equipment used: None Transfers: Sit to/from Stand, Bed to chair/wheelchair/BSC Sit to Stand: Min guard     Squat pivot transfers: Min guard     General transfer comment: Pt performed squat pivot transfer from bed <> wheelchair. Pt required CGA for safety although he was adamant about therapist assist. Pt with no LOB. Pt mobilized wheelchair minimal distance in room thereafter at independent level but refused to go out in hallway.    Ambulation/Gait               General Gait Details: unable   Stairs             Wheelchair Mobility    Modified Rankin (Stroke Patients Only)       Balance Overall balance assessment: Needs assistance Sitting-balance support: No upper extremity supported Sitting balance-Leahy Scale:  (fair to good)     Standing balance support: Bilateral upper extremity supported Standing balance-Leahy Scale: Poor                              Cognition Arousal/Alertness: Awake/alert Behavior During Therapy: Flat affect, Agitated Overall Cognitive Status: Within Functional Limits for tasks assessed                                 General Comments: Pt A and O. Decreased safety awareness present.        Exercises      General Comments        Pertinent Vitals/Pain Pain Assessment Pain Assessment:  No/denies pain Pain Location: R knee Pain Descriptors / Indicators: Dull    Home Living                          Prior Function            PT Goals (current goals can now be found in the care plan section) Acute Rehab PT Goals Patient Stated Goal: none stated PT Goal Formulation: With patient Time For Goal Achievement: 04/23/22 Potential to Achieve Goals: Fair Progress towards PT goals: Progressing toward goals    Frequency    Min 3X/week      PT Plan Current plan remains appropriate    Co-evaluation              AM-PAC PT  "6 Clicks" Mobility   Outcome Measure  Help needed turning from your back to your side while in a flat bed without using bedrails?: None Help needed moving from lying on your back to sitting on the side of a flat bed without using bedrails?: None Help needed moving to and from a bed to a chair (including a wheelchair)?: A Little Help needed standing up from a chair using your arms (e.g., wheelchair or bedside chair)?: A Little Help needed to walk in hospital room?: Total Help needed climbing 3-5 steps with a railing? : Total 6 Click Score: 16    End of Session Equipment Utilized During Treatment: Gait belt Activity Tolerance: Patient tolerated treatment well;Treatment limited secondary to agitation (limited by weakness) Patient left: in bed;with call bell/phone within reach;with bed alarm set Nurse Communication: Mobility status PT Visit Diagnosis: Other abnormalities of gait and mobility (R26.89);History of falling (Z91.81);Muscle weakness (generalized) (M62.81);Pain Pain - Right/Left: Right Pain - part of body: Knee     Time: 5449-2010 PT Time Calculation (min) (ACUTE ONLY): 11 min  Charges:  $Therapeutic Activity: 8-22 mins                     Tana Coast, PT    [employer] 04/16/2022, 10:20 AM

## 2022-04-19 ENCOUNTER — Other Ambulatory Visit (HOSPITAL_COMMUNITY): Payer: Self-pay

## 2022-05-21 ENCOUNTER — Ambulatory Visit
Admission: RE | Admit: 2022-05-21 | Discharge: 2022-05-21 | Disposition: A | Discharge status: Home / Self Care | Payer: Medicare Other | Source: Ambulatory Visit | Attending: Family | Admitting: Family

## 2022-05-21 ENCOUNTER — Other Ambulatory Visit: Payer: Self-pay | Admitting: Family

## 2022-05-21 DIAGNOSIS — R0602 Shortness of breath: Secondary | ICD-10-CM

## 2022-06-09 ENCOUNTER — Ambulatory Visit (HOSPITAL_COMMUNITY)
Admission: RE | Admit: 2022-06-09 | Discharge: 2022-06-09 | Disposition: A | Discharge status: Home / Self Care | Payer: Medicare Other | Source: Ambulatory Visit | Attending: Family | Admitting: Family

## 2022-06-09 DIAGNOSIS — R633 Feeding difficulties, unspecified: Secondary | ICD-10-CM | POA: Diagnosis present

## 2022-06-09 DIAGNOSIS — R1310 Dysphagia, unspecified: Secondary | ICD-10-CM | POA: Diagnosis not present

## 2022-09-02 ENCOUNTER — Emergency Department (HOSPITAL_COMMUNITY): Payer: Medicare Other

## 2022-09-02 ENCOUNTER — Inpatient Hospital Stay (HOSPITAL_COMMUNITY): Payer: Medicare Other

## 2022-09-02 ENCOUNTER — Inpatient Hospital Stay (HOSPITAL_COMMUNITY)
Admission: EM | Admit: 2022-09-02 | Discharge: 2022-09-17 | DRG: 207 | Disposition: A | Discharge status: Home Health | Payer: Medicare Other | Attending: Family Medicine | Admitting: Family Medicine

## 2022-09-02 DIAGNOSIS — E43 Unspecified severe protein-calorie malnutrition: Secondary | ICD-10-CM | POA: Diagnosis not present

## 2022-09-02 DIAGNOSIS — R54 Age-related physical debility: Secondary | ICD-10-CM | POA: Diagnosis not present

## 2022-09-02 DIAGNOSIS — Z79899 Other long term (current) drug therapy: Secondary | ICD-10-CM

## 2022-09-02 DIAGNOSIS — I4510 Unspecified right bundle-branch block: Secondary | ICD-10-CM | POA: Diagnosis present

## 2022-09-02 DIAGNOSIS — J440 Chronic obstructive pulmonary disease with acute lower respiratory infection: Secondary | ICD-10-CM | POA: Diagnosis present

## 2022-09-02 DIAGNOSIS — E871 Hypo-osmolality and hyponatremia: Secondary | ICD-10-CM | POA: Diagnosis present

## 2022-09-02 DIAGNOSIS — E162 Hypoglycemia, unspecified: Secondary | ICD-10-CM | POA: Diagnosis not present

## 2022-09-02 DIAGNOSIS — J9621 Acute and chronic respiratory failure with hypoxia: Secondary | ICD-10-CM | POA: Diagnosis present

## 2022-09-02 DIAGNOSIS — R0602 Shortness of breath: Secondary | ICD-10-CM | POA: Diagnosis present

## 2022-09-02 DIAGNOSIS — I952 Hypotension due to drugs: Secondary | ICD-10-CM | POA: Diagnosis not present

## 2022-09-02 DIAGNOSIS — Z681 Body mass index (BMI) 19 or less, adult: Secondary | ICD-10-CM | POA: Diagnosis not present

## 2022-09-02 DIAGNOSIS — Y900 Blood alcohol level of less than 20 mg/100 ml: Secondary | ICD-10-CM | POA: Diagnosis present

## 2022-09-02 DIAGNOSIS — J441 Chronic obstructive pulmonary disease with (acute) exacerbation: Secondary | ICD-10-CM | POA: Diagnosis not present

## 2022-09-02 DIAGNOSIS — D649 Anemia, unspecified: Secondary | ICD-10-CM | POA: Diagnosis present

## 2022-09-02 DIAGNOSIS — Z833 Family history of diabetes mellitus: Secondary | ICD-10-CM | POA: Diagnosis not present

## 2022-09-02 DIAGNOSIS — J9622 Acute and chronic respiratory failure with hypercapnia: Secondary | ICD-10-CM | POA: Diagnosis present

## 2022-09-02 DIAGNOSIS — J449 Chronic obstructive pulmonary disease, unspecified: Secondary | ICD-10-CM | POA: Diagnosis present

## 2022-09-02 DIAGNOSIS — I10 Essential (primary) hypertension: Secondary | ICD-10-CM | POA: Diagnosis present

## 2022-09-02 DIAGNOSIS — G9340 Encephalopathy, unspecified: Secondary | ICD-10-CM | POA: Diagnosis not present

## 2022-09-02 DIAGNOSIS — Z89512 Acquired absence of left leg below knee: Secondary | ICD-10-CM

## 2022-09-02 DIAGNOSIS — J9600 Acute respiratory failure, unspecified whether with hypoxia or hypercapnia: Secondary | ICD-10-CM | POA: Diagnosis present

## 2022-09-02 DIAGNOSIS — F17210 Nicotine dependence, cigarettes, uncomplicated: Secondary | ICD-10-CM | POA: Diagnosis present

## 2022-09-02 DIAGNOSIS — F10231 Alcohol dependence with withdrawal delirium: Secondary | ICD-10-CM | POA: Diagnosis present

## 2022-09-02 DIAGNOSIS — R1310 Dysphagia, unspecified: Secondary | ICD-10-CM | POA: Diagnosis not present

## 2022-09-02 DIAGNOSIS — J189 Pneumonia, unspecified organism: Principal | ICD-10-CM | POA: Diagnosis present

## 2022-09-02 DIAGNOSIS — J9602 Acute respiratory failure with hypercapnia: Secondary | ICD-10-CM | POA: Diagnosis present

## 2022-09-02 DIAGNOSIS — I739 Peripheral vascular disease, unspecified: Secondary | ICD-10-CM | POA: Diagnosis present

## 2022-09-02 DIAGNOSIS — G9341 Metabolic encephalopathy: Secondary | ICD-10-CM | POA: Diagnosis not present

## 2022-09-02 DIAGNOSIS — I959 Hypotension, unspecified: Secondary | ICD-10-CM | POA: Insufficient documentation

## 2022-09-02 DIAGNOSIS — R0902 Hypoxemia: Secondary | ICD-10-CM

## 2022-09-02 DIAGNOSIS — F10930 Alcohol use, unspecified with withdrawal, uncomplicated: Secondary | ICD-10-CM

## 2022-09-02 DIAGNOSIS — F1010 Alcohol abuse, uncomplicated: Secondary | ICD-10-CM | POA: Diagnosis present

## 2022-09-02 LAB — GLUCOSE, CAPILLARY
Glucose-Capillary: 166 mg/dL — ABNORMAL HIGH (ref 70–99)
Glucose-Capillary: 185 mg/dL — ABNORMAL HIGH (ref 70–99)

## 2022-09-02 LAB — POCT I-STAT 7, (LYTES, BLD GAS, ICA,H+H)
Acid-Base Excess: 1 mmol/L (ref 0.0–2.0)
Bicarbonate: 28.3 mmol/L — ABNORMAL HIGH (ref 20.0–28.0)
Calcium, Ion: 1.09 mmol/L — ABNORMAL LOW (ref 1.15–1.40)
HCT: 30 % — ABNORMAL LOW (ref 39.0–52.0)
Hemoglobin: 10.2 g/dL — ABNORMAL LOW (ref 13.0–17.0)
O2 Saturation: 95 %
Patient temperature: 97.3
Potassium: 3.2 mmol/L — ABNORMAL LOW (ref 3.5–5.1)
Sodium: 130 mmol/L — ABNORMAL LOW (ref 135–145)
TCO2: 30 mmol/L (ref 22–32)
pCO2 arterial: 55.6 mmHg — ABNORMAL HIGH (ref 32–48)
pH, Arterial: 7.311 — ABNORMAL LOW (ref 7.35–7.45)
pO2, Arterial: 84 mmHg (ref 83–108)

## 2022-09-02 LAB — CORTISOL: Cortisol, Plasma: 15.1 ug/dL

## 2022-09-02 LAB — BASIC METABOLIC PANEL
Anion gap: 12 (ref 5–15)
Anion gap: 8 (ref 5–15)
BUN: 18 mg/dL (ref 8–23)
BUN: 17 mg/dL (ref 8–23)
CO2: 31 mmol/L (ref 22–32)
CO2: 26 mmol/L (ref 22–32)
Calcium: 8.1 mg/dL — ABNORMAL LOW (ref 8.9–10.3)
Calcium: 7.3 mg/dL — ABNORMAL LOW (ref 8.9–10.3)
Chloride: 86 mmol/L — ABNORMAL LOW (ref 98–111)
Chloride: 94 mmol/L — ABNORMAL LOW (ref 98–111)
Creatinine, Ser: 1.15 mg/dL (ref 0.61–1.24)
Creatinine, Ser: 0.82 mg/dL (ref 0.61–1.24)
GFR, Estimated: >60 mL/min (ref >60)
GFR, Estimated: >60 mL/min (ref >60)
Glucose, Bld: 112 mg/dL — ABNORMAL HIGH (ref 70–99)
Glucose, Bld: 124 mg/dL — ABNORMAL HIGH (ref 70–99)
Potassium: 3.5 mmol/L (ref 3.5–5.1)
Potassium: 3.5 mmol/L (ref 3.5–5.1)
Sodium: 129 mmol/L — ABNORMAL LOW (ref 135–145)
Sodium: 128 mmol/L — ABNORMAL LOW (ref 135–145)

## 2022-09-02 LAB — URINALYSIS, COMPLETE (UACMP) WITH MICROSCOPIC
Bacteria, UA: NONE SEEN
Bilirubin Urine: NEGATIVE
Glucose, UA: 150 mg/dL — AB
Hgb urine dipstick: NEGATIVE
Ketones, ur: NEGATIVE mg/dL
Leukocytes,Ua: NEGATIVE
Nitrite: NEGATIVE
Protein, ur: NEGATIVE mg/dL
Specific Gravity, Urine: 1.015 (ref 1.005–1.030)
pH: 5 (ref 5.0–8.0)

## 2022-09-02 LAB — I-STAT ARTERIAL BLOOD GAS, ED
Acid-base deficit: 1 mmol/L (ref 0.0–2.0)
Bicarbonate: 28.3 mmol/L — ABNORMAL HIGH (ref 20.0–28.0)
Calcium, Ion: 1.08 mmol/L — ABNORMAL LOW (ref 1.15–1.40)
HCT: 34 % — ABNORMAL LOW (ref 39.0–52.0)
Hemoglobin: 11.6 g/dL — ABNORMAL LOW (ref 13.0–17.0)
O2 Saturation: 98 %
Potassium: 3.2 mmol/L — ABNORMAL LOW (ref 3.5–5.1)
Sodium: 131 mmol/L — ABNORMAL LOW (ref 135–145)
TCO2: 30 mmol/L (ref 22–32)
pCO2 arterial: 67.7 mmHg (ref 32–48)
pH, Arterial: 7.228 — ABNORMAL LOW (ref 7.35–7.45)
pO2, Arterial: 140 mmHg — ABNORMAL HIGH (ref 83–108)

## 2022-09-02 LAB — HEPATIC FUNCTION PANEL
ALT: 11 U/L (ref 0–44)
AST: 25 U/L (ref 15–41)
Albumin: 3.2 g/dL — ABNORMAL LOW (ref 3.5–5.0)
Alkaline Phosphatase: 71 U/L (ref 38–126)
Bilirubin, Direct: 0.3 mg/dL — ABNORMAL HIGH (ref 0.0–0.2)
Indirect Bilirubin: 0.9 mg/dL (ref 0.3–0.9)
Total Bilirubin: 1.2 mg/dL (ref 0.3–1.2)
Total Protein: 6.2 g/dL — ABNORMAL LOW (ref 6.5–8.1)

## 2022-09-02 LAB — SODIUM, URINE, RANDOM: Sodium, Ur: 95 mmol/L

## 2022-09-02 LAB — I-STAT VENOUS BLOOD GAS, ED
Acid-Base Excess: 4 mmol/L — ABNORMAL HIGH (ref 0.0–2.0)
Bicarbonate: 33.2 mmol/L — ABNORMAL HIGH (ref 20.0–28.0)
Calcium, Ion: 1.04 mmol/L — ABNORMAL LOW (ref 1.15–1.40)
HCT: 37 % — ABNORMAL LOW (ref 39.0–52.0)
Hemoglobin: 12.6 g/dL — ABNORMAL LOW (ref 13.0–17.0)
O2 Saturation: 57 %
Potassium: 3.8 mmol/L (ref 3.5–5.1)
Sodium: 129 mmol/L — ABNORMAL LOW (ref 135–145)
TCO2: 35 mmol/L — ABNORMAL HIGH (ref 22–32)
pCO2, Ven: 72.4 mmHg (ref 44–60)
pH, Ven: 7.269 (ref 7.25–7.43)
pO2, Ven: 35 mmHg (ref 32–45)

## 2022-09-02 LAB — CBC
HCT: 33.8 % — ABNORMAL LOW (ref 39.0–52.0)
Hemoglobin: 11.7 g/dL — ABNORMAL LOW (ref 13.0–17.0)
MCH: 32 pg (ref 26.0–34.0)
MCHC: 34.6 g/dL (ref 30.0–36.0)
MCV: 92.3 fL (ref 80.0–100.0)
Platelets: 317 10*3/uL (ref 150–400)
RBC: 3.66 MIL/uL — ABNORMAL LOW (ref 4.22–5.81)
RDW: 12.9 % (ref 11.5–15.5)
WBC: 14.7 10*3/uL — ABNORMAL HIGH (ref 4.0–10.5)
nRBC: 0 % (ref 0.0–0.2)

## 2022-09-02 LAB — ETHANOL: Alcohol, Ethyl (B): <10 mg/dL (ref <10)

## 2022-09-02 LAB — TSH: TSH: 0.783 u[IU]/mL (ref 0.350–4.500)

## 2022-09-02 LAB — PROCALCITONIN: Procalcitonin: <0.1 ng/mL

## 2022-09-02 LAB — BRAIN NATRIURETIC PEPTIDE: B Natriuretic Peptide: 81.2 pg/mL (ref 0.0–100.0)

## 2022-09-02 LAB — STREP PNEUMONIAE URINARY ANTIGEN: Strep Pneumo Urinary Antigen: NEGATIVE

## 2022-09-02 LAB — MRSA NEXT GEN BY PCR, NASAL: MRSA by PCR Next Gen: NOT DETECTED

## 2022-09-02 MED ORDER — MIDAZOLAM BOLUS VIA INFUSION: 0.0000 mg | INTRAVENOUS | Status: DC | PRN

## 2022-09-02 MED ORDER — ETOMIDATE 2 MG/ML IV SOLN
INTRAVENOUS | Status: DC | PRN
  Administered 2022-09-02: 20 mg via INTRAVENOUS

## 2022-09-02 MED ORDER — ALBUTEROL SULFATE (2.5 MG/3ML) 0.083% IN NEBU
INHALATION_SOLUTION | RESPIRATORY_TRACT | Status: AC
  Administered 2022-09-02: 10 mL via RESPIRATORY_TRACT
  Filled 2022-09-02: qty 9

## 2022-09-02 MED ORDER — FENTANYL CITRATE PF 50 MCG/ML IJ SOSY: 25.0000 ug | PREFILLED_SYRINGE | Freq: Once | INTRAMUSCULAR | Status: DC

## 2022-09-02 MED ORDER — FENTANYL CITRATE PF 50 MCG/ML IJ SOSY
25.0000 ug | PREFILLED_SYRINGE | INTRAMUSCULAR | Status: DC | PRN
  Administered 2022-09-02 (×2): 25 ug via INTRAVENOUS
  Filled 2022-09-02: qty 1

## 2022-09-02 MED ORDER — SODIUM CHLORIDE 0.9 % IV SOLN: 250.0000 mL | INTRAVENOUS | Status: DC | PRN

## 2022-09-02 MED ORDER — IPRATROPIUM BROMIDE 0.02 % IN SOLN
1.5000 mg | Freq: Once | RESPIRATORY_TRACT | Status: AC
  Administered 2022-09-02: 1.5 mg via RESPIRATORY_TRACT
  Filled 2022-09-02: qty 7.5

## 2022-09-02 MED ORDER — PHENYLEPHRINE 80 MCG/ML (10ML) SYRINGE FOR IV PUSH (FOR BLOOD PRESSURE SUPPORT)
PREFILLED_SYRINGE | INTRAVENOUS | Status: DC | PRN
  Administered 2022-09-02: 80 ug via INTRAVENOUS

## 2022-09-02 MED ORDER — DOCUSATE SODIUM 100 MG PO CAPS: 100.0000 mg | ORAL_CAPSULE | Freq: Two times a day (BID) | ORAL | Status: DC | PRN

## 2022-09-02 MED ORDER — FENTANYL BOLUS VIA INFUSION
25.0000 ug | INTRAVENOUS | Status: DC | PRN
  Administered 2022-09-03 (×2): 50 ug via INTRAVENOUS
  Administered 2022-09-04 – 2022-09-06 (×6): 100 ug via INTRAVENOUS

## 2022-09-02 MED ORDER — FOLIC ACID 5 MG/ML IJ SOLN
1.0000 mg | Freq: Every day | INTRAMUSCULAR | Status: DC
  Administered 2022-09-02 – 2022-09-07 (×6): 1 mg via INTRAVENOUS
  Filled 2022-09-02 (×8): qty 0.2

## 2022-09-02 MED ORDER — SODIUM CHLORIDE 0.9 % IV SOLN
250.0000 mL | INTRAVENOUS | Status: DC
  Administered 2022-09-02: 250 mL via INTRAVENOUS

## 2022-09-02 MED ORDER — THIAMINE HCL 100 MG/ML IJ SOLN
100.0000 mg | Freq: Every day | INTRAMUSCULAR | Status: DC
  Administered 2022-09-02 – 2022-09-07 (×6): 100 mg via INTRAVENOUS
  Filled 2022-09-02 (×6): qty 2

## 2022-09-02 MED ORDER — DOCUSATE SODIUM 50 MG/5ML PO LIQD
100.0000 mg | Freq: Two times a day (BID) | ORAL | Status: DC
  Administered 2022-09-02 – 2022-09-10 (×9): 100 mg
  Filled 2022-09-02 (×11): qty 10

## 2022-09-02 MED ORDER — PHENOBARBITAL 32.4 MG PO TABS
64.8000 mg | ORAL_TABLET | Freq: Three times a day (TID) | ORAL | Status: DC
  Administered 2022-09-04 – 2022-09-06 (×5): 64.8 mg
  Filled 2022-09-02 (×5): qty 2

## 2022-09-02 MED ORDER — PROPOFOL 1000 MG/100ML IV EMUL
0.0000 ug/kg/min | INTRAVENOUS | Status: DC
  Administered 2022-09-02 – 2022-09-03 (×2): 10 ug/kg/min via INTRAVENOUS
  Administered 2022-09-03 (×2): 45 ug/kg/min via INTRAVENOUS
  Administered 2022-09-04: 15 ug/kg/min via INTRAVENOUS
  Administered 2022-09-04: 10 ug/kg/min via INTRAVENOUS
  Administered 2022-09-05 (×3): 15 ug/kg/min via INTRAVENOUS
  Filled 2022-09-02 (×10): qty 100

## 2022-09-02 MED ORDER — FENTANYL 2500MCG IN NS 250ML (10MCG/ML) PREMIX INFUSION
25.0000 ug/h | INTRAVENOUS | Status: DC
  Administered 2022-09-02: 25 ug/h via INTRAVENOUS
  Filled 2022-09-02: qty 250

## 2022-09-02 MED ORDER — ALBUTEROL (5 MG/ML) CONTINUOUS INHALATION SOLN
10.0000 mg/h | INHALATION_SOLUTION | Freq: Once | RESPIRATORY_TRACT | Status: AC
  Administered 2022-09-02: 10 mg/h via RESPIRATORY_TRACT
  Filled 2022-09-02: qty 20

## 2022-09-02 MED ORDER — FENTANYL 2500MCG IN NS 250ML (10MCG/ML) PREMIX INFUSION
25.0000 ug/h | INTRAVENOUS | Status: DC
  Administered 2022-09-03: 25 ug/h via INTRAVENOUS
  Administered 2022-09-04: 50 ug/h via INTRAVENOUS
  Administered 2022-09-05: 100 ug/h via INTRAVENOUS
  Administered 2022-09-05: 125 ug/h via INTRAVENOUS
  Filled 2022-09-02 (×4): qty 250

## 2022-09-02 MED ORDER — SODIUM CHLORIDE 0.9% FLUSH: 3.0000 mL | INTRAVENOUS | Status: DC | PRN

## 2022-09-02 MED ORDER — MIDAZOLAM-SODIUM CHLORIDE 100-0.9 MG/100ML-% IV SOLN: 0.5000 mg/h | INTRAVENOUS | Status: DC

## 2022-09-02 MED ORDER — FENTANYL CITRATE PF 50 MCG/ML IJ SOSY
25.0000 ug | PREFILLED_SYRINGE | Freq: Once | INTRAMUSCULAR | Status: AC
  Administered 2022-09-02: 25 ug via INTRAVENOUS
  Filled 2022-09-02: qty 1

## 2022-09-02 MED ORDER — SODIUM CHLORIDE 0.9 % IV SOLN
2.0000 g | INTRAVENOUS | Status: AC
  Administered 2022-09-02 – 2022-09-06 (×5): 2 g via INTRAVENOUS
  Filled 2022-09-02 (×5): qty 20

## 2022-09-02 MED ORDER — PHENOBARBITAL 32.4 MG PO TABS: 32.4000 mg | ORAL_TABLET | Freq: Three times a day (TID) | ORAL | Status: DC

## 2022-09-02 MED ORDER — SODIUM CHLORIDE 0.9% FLUSH
3.0000 mL | Freq: Two times a day (BID) | INTRAVENOUS | Status: DC
  Administered 2022-09-02 – 2022-09-17 (×29): 3 mL via INTRAVENOUS

## 2022-09-02 MED ORDER — METHYLPREDNISOLONE SODIUM SUCC 40 MG IJ SOLR
40.0000 mg | Freq: Every day | INTRAMUSCULAR | Status: DC
  Administered 2022-09-02 – 2022-09-10 (×9): 40 mg via INTRAVENOUS
  Filled 2022-09-02 (×9): qty 1

## 2022-09-02 MED ORDER — NOREPINEPHRINE 4 MG/250ML-% IV SOLN
0.0000 ug/min | INTRAVENOUS | Status: DC
  Administered 2022-09-02: 10 ug/min via INTRAVENOUS

## 2022-09-02 MED ORDER — SODIUM CHLORIDE 0.9 % IV SOLN
500.0000 mg | INTRAVENOUS | Status: DC
  Administered 2022-09-02 – 2022-09-04 (×3): 500 mg via INTRAVENOUS
  Filled 2022-09-02 (×4): qty 5

## 2022-09-02 MED ORDER — SODIUM CHLORIDE 0.9 % IV BOLUS
1000.0000 mL | Freq: Once | INTRAVENOUS | Status: AC
  Administered 2022-09-02: 1000 mL via INTRAVENOUS

## 2022-09-02 MED ORDER — LORAZEPAM 2 MG/ML IJ SOLN: 1.0000 mg | INTRAMUSCULAR | Status: DC | PRN

## 2022-09-02 MED ORDER — ALBUTEROL SULFATE (2.5 MG/3ML) 0.083% IN NEBU: 2.5000 mg | INHALATION_SOLUTION | RESPIRATORY_TRACT | Status: DC | PRN

## 2022-09-02 MED ORDER — FENTANYL BOLUS VIA INFUSION: 25.0000 ug | INTRAVENOUS | Status: DC | PRN

## 2022-09-02 MED ORDER — MIDAZOLAM-SODIUM CHLORIDE 100-0.9 MG/100ML-% IV SOLN: 0.0000 mg/h | INTRAVENOUS | Status: DC

## 2022-09-02 MED ORDER — FENTANYL 2500MCG IN NS 250ML (10MCG/ML) PREMIX INFUSION: 0.0000 ug/h | INTRAVENOUS | Status: DC

## 2022-09-02 MED ORDER — INSULIN ASPART 100 UNIT/ML IJ SOLN
0.0000 [IU] | INTRAMUSCULAR | Status: DC
  Administered 2022-09-02 (×2): 2 [IU] via SUBCUTANEOUS
  Administered 2022-09-03 – 2022-09-04 (×3): 1 [IU] via SUBCUTANEOUS
  Administered 2022-09-05: 2 [IU] via SUBCUTANEOUS
  Administered 2022-09-05 (×2): 1 [IU] via SUBCUTANEOUS

## 2022-09-02 MED ORDER — POLYETHYLENE GLYCOL 3350 17 G PO PACK
17.0000 g | PACK | Freq: Every day | ORAL | Status: DC
  Administered 2022-09-02 – 2022-09-05 (×4): 17 g
  Filled 2022-09-02 (×4): qty 1

## 2022-09-02 MED ORDER — METHYLPREDNISOLONE SODIUM SUCC 125 MG IJ SOLR
125.0000 mg | Freq: Once | INTRAMUSCULAR | Status: AC
  Administered 2022-09-02: 125 mg via INTRAVENOUS
  Filled 2022-09-02: qty 2

## 2022-09-02 MED ORDER — IPRATROPIUM-ALBUTEROL 0.5-2.5 (3) MG/3ML IN SOLN
3.0000 mL | RESPIRATORY_TRACT | Status: DC
  Administered 2022-09-02 (×2): 3 mL via RESPIRATORY_TRACT
  Filled 2022-09-02 (×2): qty 3

## 2022-09-02 MED ORDER — LORAZEPAM 2 MG/ML IJ SOLN: 2.0000 mg | Freq: Once | INTRAMUSCULAR | Status: DC

## 2022-09-02 MED ORDER — MIDAZOLAM HCL 2 MG/2ML IJ SOLN
1.0000 mg | INTRAMUSCULAR | Status: DC | PRN
  Administered 2022-09-02: 2 mg via INTRAVENOUS
  Administered 2022-09-06 (×2): 1 mg via INTRAVENOUS
  Filled 2022-09-02 (×3): qty 2

## 2022-09-02 MED ORDER — ENOXAPARIN SODIUM 40 MG/0.4ML IJ SOSY
40.0000 mg | PREFILLED_SYRINGE | INTRAMUSCULAR | Status: DC
  Administered 2022-09-02 – 2022-09-16 (×15): 40 mg via SUBCUTANEOUS
  Filled 2022-09-02 (×15): qty 0.4

## 2022-09-02 MED ORDER — CHLORHEXIDINE GLUCONATE CLOTH 2 % EX PADS
6.0000 | MEDICATED_PAD | Freq: Every day | CUTANEOUS | Status: DC
  Administered 2022-09-02 – 2022-09-10 (×7): 6 via TOPICAL

## 2022-09-02 MED ORDER — IPRATROPIUM-ALBUTEROL 0.5-2.5 (3) MG/3ML IN SOLN
3.0000 mL | Freq: Four times a day (QID) | RESPIRATORY_TRACT | Status: DC
  Administered 2022-09-03 – 2022-09-07 (×15): 3 mL via RESPIRATORY_TRACT
  Filled 2022-09-02 (×13): qty 3

## 2022-09-02 MED ORDER — IOHEXOL 350 MG/ML SOLN
75.0000 mL | Freq: Once | INTRAVENOUS | Status: AC | PRN
  Administered 2022-09-02: 75 mL via INTRAVENOUS

## 2022-09-02 MED ORDER — IPRATROPIUM BROMIDE 0.02 % IN SOLN
RESPIRATORY_TRACT | Status: AC
  Administered 2022-09-02: 3 mL via RESPIRATORY_TRACT
  Filled 2022-09-02: qty 5

## 2022-09-02 MED ORDER — ROCURONIUM BROMIDE 50 MG/5ML IV SOLN
INTRAVENOUS | Status: DC | PRN
  Administered 2022-09-02: 80 mg via INTRAVENOUS

## 2022-09-02 MED ORDER — MAGNESIUM SULFATE 2 GM/50ML IV SOLN
2.0000 g | Freq: Once | INTRAVENOUS | Status: AC
  Administered 2022-09-02: 2 g via INTRAVENOUS
  Filled 2022-09-02: qty 50

## 2022-09-02 MED ORDER — LACTATED RINGERS IV BOLUS
1000.0000 mL | Freq: Once | INTRAVENOUS | Status: AC
  Administered 2022-09-02: 1000 mL via INTRAVENOUS

## 2022-09-02 MED ORDER — PHENOBARBITAL 32.4 MG PO TABS
97.2000 mg | ORAL_TABLET | Freq: Three times a day (TID) | ORAL | Status: AC
  Administered 2022-09-02 – 2022-09-04 (×6): 97.2 mg
  Filled 2022-09-02 (×6): qty 3

## 2022-09-02 MED ORDER — NOREPINEPHRINE 4 MG/250ML-% IV SOLN
2.0000 ug/min | INTRAVENOUS | Status: DC
  Administered 2022-09-02: 10 ug/min via INTRAVENOUS
  Administered 2022-09-03: 2 ug/min via INTRAVENOUS
  Filled 2022-09-02: qty 250

## 2022-09-02 MED ORDER — FENTANYL CITRATE PF 50 MCG/ML IJ SOSY
25.0000 ug | PREFILLED_SYRINGE | INTRAMUSCULAR | Status: DC | PRN
  Administered 2022-09-02 (×2): 100 ug via INTRAVENOUS
  Filled 2022-09-02 (×2): qty 2
  Filled 2022-09-02: qty 1

## 2022-09-02 MED ORDER — POLYETHYLENE GLYCOL 3350 17 G PO PACK: 17.0000 g | PACK | Freq: Every day | ORAL | Status: DC | PRN

## 2022-09-02 MED ORDER — FAMOTIDINE 20 MG PO TABS
20.0000 mg | ORAL_TABLET | Freq: Two times a day (BID) | ORAL | Status: DC
  Administered 2022-09-02 – 2022-09-06 (×8): 20 mg
  Filled 2022-09-02 (×8): qty 1

## 2022-09-02 NOTE — ED Triage Notes (Signed)
Patient with history of COPD brought by family for further evaluation. Patient was supposed to have a chest x-ray but the imaging center was closed. Patient denies chest pain by nodding his head, is disoriented to date, audibly wheezing in triage. Room air SpO2 87%, placed on nasal cannula.

## 2022-09-02 NOTE — Progress Notes (Signed)
Dear Doctor: This patient has been identified as a candidate for PICC /CVCfor the following reason (s): IV therapy over 48 hours, drug pH or osmolality (causing phlebitis, infiltration in 24 hours), drug extravasation potential with tissue necrosis (KCL, Dilantin, Dopamine, CaCl, MgSO4, chemo vesicant), poor veins/poor circulatory system (CHF, COPD, emphysema, diabetes, steroid use, IV drug abuse, etc.), and incompatible drugs (aminophyllin, TPN, heparin, given with an antibiotic) If you agree, please write an order for the indicated device.   Thank you for supporting the early vascular access assessment program. 

## 2022-09-02 NOTE — Progress Notes (Addendum)
eLink Physician-Brief Progress Note Patient Name: Ronald [name] DOB: August 09, 1956 MRN: [id number]   Date of Service  09/02/2022  HPI/Events of Note  Ultrasound PIV could not be placed successfully.  Pt continues to require pressors.  He is intubated and sedated.   eICU Interventions  Ground team notified of need of central line.      Intervention Category Intermediate Interventions: Other:  Larinda Buttery 09/02/2022, 11:49 PM  12:19 AM Discussed with ground team.  Pt is on low dose of levophed 9mcg/min.   Plan> Continue on peripheral IV in the meantime.  PICC has been ordered for the morning.  RN to call again if with increasing pressor requirements.

## 2022-09-02 NOTE — Progress Notes (Signed)
eLink Physician-Brief Progress Note Patient Name: [name] DOB: Feb 17, 1956 MRN: [id number]   Date of Service  09/02/2022  HPI/Events of Note  66/M with COPD, alcohol abuse, s/p left BKA, presenting with increased work of breathing prompting intubation.   CTA with no PE but with right lower lobe pneumonia, mucus plugging and lung nodule as well.  7.228/67.7/140  eICU Interventions  Continue antibiotics and IV steroids.  Repeat ABG now and adjust vent settings accordingly.  Foley cath ordered.  Insulin sliding scale ordered q4hrs.  Famotidine and lovenox for prophylaxis ordered.     Intervention Category Evaluation Type: New Patient Evaluation  Larinda Buttery 09/02/2022, 8:25 PM

## 2022-09-02 NOTE — H&P (Addendum)
NAME:  Ronald [name], MRN:  [id number], DOB:  05-Aug-1956, LOS: 0 ADMISSION DATE:  09/02/2022, CONSULTATION DATE:  09/02/2022 REFERRING MD:  Silverio Lay, MD CHIEF COMPLAINT: Respiratory Failure  History of Present Illness:  Ronald [name] is a 66 y.o. Male with a PMH of COPD, ETOH and substance abuse, Left BKA, Smoker, who presented to the ED with hypoxia. Per family, pt was supposed to have a CXR completed today per PCP, but it was closed and when the patient's daughter picked him up, she noticed his WOB was increasing worse than normal. On arrival, he was noted to be disoriented to date and having audible wheezing, SHOB, and cough with Spo2 of 87%.   EDP concerned for alcohol withdrawal seizure but was too somnolent for sedation without requiring intubation. Therefore, pt was started on BiPAP and then intubated d/t worsening respiratory status. ED workup reveled Na 129, Chloride 86, WBC 14.7. VBG 7.269/72.4/35/33.2. CXR with no acute findings.  PCCM consulted for management of respiratory failure and mechanical ventilation.   Pertinent  Medical History  COPD, ETOH and substance abuse, Left BKA, Smoker  Significant Hospital Events: Including procedures, antibiotic start and stop dates in addition to other pertinent events   12/21 Admitted, Failed BiPAP > Intubated, CT Angio Ordered   Interim History / Subjective:  Pt intubated, received 2L IVF. Required short term NE post intubation. Daughters at bedside. Extensive code status conversation held at bedside (see IPAL note).   Objective   Blood pressure 111/77, pulse (Abnormal) 108, temperature 98.5 F (36.9 C), temperature source Oral, resp. rate 19, height 5\' 4"  (1.626 m), SpO2 100 %.    FiO2 (%):  [40 %-100 %] 100 % Set Rate:  [15 bmp] 15 bmp Vt Set:  [470 mL] 470 mL PEEP:  [5 cmH20] 5 cmH20 Plateau Pressure:  [15 cmH20] 15 cmH20  No intake or output data in the 24 hours ending 09/02/22 1617 There were no vitals filed for this  visit.  Physical Examination: General: Chronically ill-appearing elderly male in NAD. Intubated  HEENT: [place]/AT, anicteric sclera, PERRL, moist mucous membranes. ETT/OG in place.  Neuro: Sedated. Does not respond to verbal, tactile or noxious stimuli. Not following commands.   CV: Tachycardic, no m/g/r. PULM: Breathing even and unlabored on MV. Lung fields Rhonchi/Diminished. GI: Soft, nontender, nondistended. Normoactive bowel sounds. Extremities: +3 LE edema noted. L-BKA Skin: Cool/dry, intact.  Resolved Hospital Problem list     Assessment & Plan:  Principal Problem:   Acute respiratory failure (HCC) Active Problems:   COPD (chronic obstructive pulmonary disease) (HCC)   Acute hypercapnic respiratory failure (HCC)   ETOH abuse   Acute encephalopathy   Right bundle branch block   Hyponatremia   Hypotension   Hx of BKA, left (HCC)   Acute Respiratory Failure w/ Hypercarbia requiring intubation possibly COPD exacerbation but can not r/o heart failure exacerbation, also considering Pulm Embolism   Plan - sedation with goal RASS (0, -1)  - daily SAT & SBT once stable - Minimize sedation - PAD protocol  - VAP protocol  - F/u Blood Cultures  - F/u Urine Culture - F/u Trach Asp Culture - F/u RVP - Procal Pending - DuoNebs added - Steroids Started 12/21 - CT Angio pending - ECHO  - Empiric Azithro and Ceftriaxone will de-escalate appropriately   Hyponatremia appears Chronic, 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, but can not exclude SIADH Plan - Trend Na+  - Urine Na+ and Osmo Pending - Serum Osmo Pending  - Cortisol  and TSH pending  - KVO IVF until results appear   Acute metabolic encephalopathy superimposed on Chronic Alcohol Abuse w/ concern for W/D Plan - Thiamine - Folic Acid - Prop Infusion w/ PRN Fent > if RASS remains -2 off sedation, then consider EEG - Phenobarbital   Hypertension/Transient Drug Induced Hypotension post intubation - Holding home meds,  add as tolerated  Best Practice (right click and "Reselect all SmartList Selections" daily)   Diet/type: NPO DVT prophylaxis: LMWH GI prophylaxis: H2B Lines: N/A Foley:  N/A Code Status:  full code Last date of multidisciplinary goals of care discussion [09/02/2022]: See IPAL note  Labs   CBC: Recent Labs  Lab 09/02/22 1216 09/02/22 1508  WBC 14.7*  --   HGB 11.7* 12.6*  HCT 33.8* 37.0*  MCV 92.3  --   PLT 317  --     Basic Metabolic Panel: Recent Labs  Lab 09/02/22 1216 09/02/22 1508  NA 129* 129*  K 3.5 3.8  CL 86*  --   CO2 31  --   GLUCOSE 112*  --   BUN 18  --   CREATININE 1.15  --   CALCIUM 8.1*  --    GFR: CrCl cannot be calculated (Unknown ideal weight.). Recent Labs  Lab 09/02/22 1216  WBC 14.7*    Liver Function Tests: No results for input(s): "AST", "ALT", "ALKPHOS", "BILITOT", "PROT", "ALBUMIN" in the last 168 hours. No results for input(s): "LIPASE", "AMYLASE" in the last 168 hours. No results for input(s): "AMMONIA" in the last 168 hours.  ABG    Component Value Date/Time   PHART 7.358 02/03/2022 0026   PCO2ART 45.8 02/03/2022 0026   PO2ART 64 (L) 02/03/2022 0026   HCO3 33.2 (H) 09/02/2022 1508   TCO2 35 (H) 09/02/2022 1508   ACIDBASEDEF 2.0 03/18/2019 0038   O2SAT 57 09/02/2022 1508     Coagulation Profile: No results for input(s): "INR", "PROTIME" in the last 168 hours.  Cardiac Enzymes: No results for input(s): "CKTOTAL", "CKMB", "CKMBINDEX", "TROPONINI" in the last 168 hours.  HbA1C: Hgb A1c MFr Bld  Date/Time Value Ref Range Status  11/09/2013 01:00 PM 5.9 (H) <5.7 % Final    Comment:    (NOTE)                                                                       According to the ADA Clinical Practice Recommendations for 2011, when HbA1c is used as a screening test:  >=6.5%   Diagnostic of Diabetes Mellitus           (if abnormal result is confirmed) 5.7-6.4%   Increased risk of developing Diabetes  [place] References:Diagnosis and Classification of Diabetes Mellitus,Diabetes Care,2011,34(Suppl 1):S62-S69 and Standards of Medical Care in         Diabetes - 2011,Diabetes Care,2011,34 (Suppl 1):S11-S61.    CBG: No results for input(s): "GLUCAP" in the last 168 hours.  Review of Systems:   Review of Systems  Unable to perform ROS: Intubated    Past Medical History:  He,  has a past medical history of Critical lower limb ischemia (HCC), ETOH abuse, Gout, and Hypertension.   Surgical History:   Past Surgical History:  Procedure Laterality Date   ABDOMINAL AORTAGRAM N/A  12/26/2013   Procedure: ABDOMINAL Ronny Flurry;  Surgeon: [name], MD;  [facility] C[facility] LAB;  Service: Cardiovascular;  Laterality: N/A;   AMPUTATION Left 12/28/2013   Procedure: LEFT BELOW KNEE AMPUTATION;  Surgeon: Nadara Mustard, MD;  Location: [facility] OR;  Service: Orthopedics;  Laterality: Left;   BELOW KNEE LEG AMPUTATION     Left   LEG SURGERY       Social History:   reports that he has been smoking cigarettes. He has a 20.00 pack-year smoking history. He has never used smokeless tobacco. He reports current alcohol use of about 120.0 standard drinks of alcohol per week. He reports current drug use. Drugs: Cocaine and Marijuana.   Family History:  His family history includes Diabetes Mellitus II in his mother. There is no history of CAD or Stroke.   Allergies No Known Allergies   Home Medications  Prior to Admission medications   Medication Sig Start Date End Date Taking? Authorizing Provider  ADVAIR HFA 230-21 MCG/ACT inhaler Inhale 2 puffs into the lungs 2 (two) times daily. 04/16/22   Burnadette Pop, MD  albuterol (VENTOLIN HFA) 108 (90 Base) MCG/ACT inhaler Inhale 2 puffs into the lungs every 4 (four) hours as needed for wheezing or shortness of breath. 04/16/22   Burnadette Pop, MD  folic acid (FOLVITE) 1 MG tablet Take 1 tablet (1 mg total) by mouth daily. 04/17/22   Burnadette Pop, MD  ibuprofen  (ADVIL) 200 MG tablet Take 200 mg by mouth every 6 (six) hours as needed for mild pain.    [provider]  thiamine (VITAMIN B1) 100 MG tablet Take 1 tablet (100 mg total) by mouth daily. 04/17/22   Burnadette Pop, MD     Critical care time:    Royston Bake, NP-S I reviewed the above note w/ Ronald [name] and agree w/ it as outlined.  Simonne Martinet ACNP-BC [facility] Pulmonary/Critical Care Pager # [phone] OR # [phone] if no answer

## 2022-09-02 NOTE — ED Notes (Signed)
Patient is intubated. 25 at the lip. Color change noted. Bilateral breath sounds.

## 2022-09-02 NOTE — ED Notes (Signed)
Dr.Schlossman shown results of Istat VBG. ED-Lab. 

## 2022-09-02 NOTE — ED Provider Notes (Signed)
Procedure Name: Intubation Date/Time: 09/02/2022 4:10 PM  Performed by: Phyllis Ginger, MDPre-anesthesia Checklist: Patient identified, Emergency Drugs available, Timeout performed, Suction available and Patient being monitored Preoxygenation: Pre-oxygenation with 100% oxygen (BiPAP) Induction Type: Rapid sequence Ventilation: Mask ventilation without difficulty Laryngoscope Size: Mac and 3 Grade View: Grade I Tube size: 7.5 mm Number of attempts: 1 Airway Equipment and Method: Video-laryngoscopy and Rigid stylet Placement Confirmation: ETT inserted through vocal cords under direct vision, Positive ETCO2, Breath sounds checked- equal and bilateral and CO2 detector Secured at: 24 cm Tube secured with: ETT holder Dental Injury: Teeth and Oropharynx as per pre-operative assessment         Phyllis Ginger, MD 09/02/22 1615    Drenda Freeze, MD 09/02/22 1635

## 2022-09-02 NOTE — ED Provider Triage Note (Signed)
Emergency Medicine Provider Triage Evaluation Note  Ronald [name] , a 66 y.o. male  was evaluated in triage.  Pt complains of worsening shortness of breath and disorientation. Patient reportedly started having worsening of symptoms last night. Patient is able to speak and is currently oriented to person, place, and event but disoriented to time. Patient was reportedly supposed to have a chest x-ray this morning due to worsening shortness of breath.  He was evaluated by primary care last Friday.  When they arrived to the imaging center they reported that it was closed.  Patient currently complaining of wheezing, cough, shortness of breath, altered mental status.  Review of Systems  Positive: As above Negative: As above  Physical Exam  BP (!) 124/96   Pulse (!) 117   Temp 98.5 F (36.9 C) (Oral)   Resp (!) 28   SpO2 100%  Gen:   Awake, no distress   Resp:  Normal effort, widespread wheezes MSK:   Moves extremities without difficulty  Other:    Medical Decision Making  Medically screening exam initiated at 12:19 PM.  Appropriate orders placed.  Ronald [name] was informed that the remainder of the evaluation will be completed by another provider, this initial triage assessment does not replace that evaluation, and the importance of remaining in the ED until their evaluation is complete.  Patient with no unilateral weakness or focal symptoms at this time.  No code stroke at this time.   Darrick Grinder, PA-C 09/02/22 1226

## 2022-09-02 NOTE — Progress Notes (Signed)
eLink Physician-Brief Progress Note Patient Name: [name] DOB: Jan 04, 1956 MRN: [id number]   Date of Service  09/02/2022  HPI/Events of Note  Notified by RN that patient is now on propofol at 45 mcgmcg/kg/min and given 2 boluses of fentanyl.  eICU Interventions  Ok to add fentanyl gtt.      Intervention Category Intermediate Interventions: Other:  Larinda Buttery 09/02/2022, 11:06 PM

## 2022-09-02 NOTE — IPAL (Signed)
  Interdisciplinary Goals of Care Family Meeting   Date carried out: 09/02/2022  Location of the meeting: Bedside  Member's involved: Nurse Practitioner, Bedside Registered Nurse, and Family Member or next of kin  Durable Power of Attorney or acting medical decision maker: three sister's who were at bedside: [employer], Flossie Hill and Vena Rua d  Discussion: We discussed goals of care for [employer] .   Current working dx Principal Problem:   Acute respiratory failure (HCC) Active Problems:   COPD (chronic obstructive pulmonary disease) (HCC)   Acute hypercapnic respiratory failure (HCC)   ETOH abuse   Acute encephalopathy   Right bundle branch block   Hyponatremia   Hypotension   Hx of BKA, left (HCC)  Poor prognosis w./ cardiac arrest  Family would want aggressive care including CPR. Should he suffer cardiac arrest and have successful resuscitation family also would want to re-visit and would then be open to DNR. The three sisters agreed that they would want to "try at least once" Code status:   Code Status: Full Code   Disposition: Continue current acute care  Time spent for the meeting: 23 min     Shelby Mattocks, NP  09/02/2022, 5:35 PM

## 2022-09-02 NOTE — Progress Notes (Signed)
This nurse assessed L forearm for USGPIV for vasopressor infusion. There is no appropriate vein for placing this line. The vein for the vasopressor has to be 0.5cm and no deeper. It is not appropriate to place PIV to upper arm for vasopressor. RFA currently has 2 PIVs infusing. Notified nurse. VAST recommends a CVC be placed at this time. Tomasita Morrow, RN VAST

## 2022-09-02 NOTE — ED Provider Notes (Signed)
[facility] EMERGENCY DEPARTMENT Provider Note   CSN: [id number] Arrival date & time: 09/02/22  1151     History  Chief Complaint  Patient presents with   Altered Mental Status    Ronald [name] is a 66 y.o. male.  HPI      66 year old male with history of alcohol abuse, hypertension, presents with concern for shortness of breath.  History limited by work of breathing. Reports shortness of breath over the last week.  Acknoledges cough.   Denies chest pain, fever. Reports his last drink was yesterday.   He was seen 12/13 and diagnosed with COPD exacerbation, had in office neb, given steroids.  Referred for CXR.  Was noted to have etoh abuse with tremors in office and did report not having etoh,  and discussed he was at risk of having withdrawal seizures.     Past Medical History:  Diagnosis Date   Critical lower limb ischemia (HCC)    ETOH abuse    Gout    Hypertension      Home Medications Prior to Admission medications   Medication Sig Start Date End Date Taking? Authorizing Provider  ADVAIR HFA 230-21 MCG/ACT inhaler Inhale 2 puffs into the lungs 2 (two) times daily. 04/16/22   Burnadette Pop, MD  albuterol (VENTOLIN HFA) 108 (90 Base) MCG/ACT inhaler Inhale 2 puffs into the lungs every 4 (four) hours as needed for wheezing or shortness of breath. 04/16/22   Burnadette Pop, MD  folic acid (FOLVITE) 1 MG tablet Take 1 tablet (1 mg total) by mouth daily. 04/17/22   Burnadette Pop, MD  ibuprofen (ADVIL) 200 MG tablet Take 200 mg by mouth every 6 (six) hours as needed for mild pain.    [provider]  thiamine (VITAMIN B1) 100 MG tablet Take 1 tablet (100 mg total) by mouth daily. 04/17/22   Burnadette Pop, MD      Allergies    Patient has no known allergies.    Review of Systems   Review of Systems  Physical Exam Updated Vital Signs BP 117/70 (BP Location: Left Arm)   Pulse (!) 23   Temp (!) 97.3 F (36.3 C) (Axillary)   Resp (!)  24   Ht 5\' 4"  (1.626 m)   Wt 72.6 kg   SpO2 100%   BMI 27.47 kg/m  Physical Exam Vitals and nursing note reviewed.  Constitutional:      General: He is not in acute distress.    Appearance: He is well-developed. He is not diaphoretic.  HENT:     Head: Normocephalic and atraumatic.  Eyes:     Conjunctiva/sclera: Conjunctivae normal.  Cardiovascular:     Rate and Rhythm: Normal rate and regular rhythm.     Heart sounds: Normal heart sounds. No murmur heard.    No friction rub. No gallop.  Pulmonary:     Effort: Tachypnea, accessory muscle usage, respiratory distress and retractions present.     Breath sounds: Wheezing present. No rales.  Abdominal:     General: There is no distension.     Palpations: Abdomen is soft.     Tenderness: There is no abdominal tenderness. There is no guarding.  Musculoskeletal:     Cervical back: Normal range of motion.  Skin:    General: Skin is warm and dry.  Neurological:     Mental Status: He is alert and oriented to person, place, and time.     GCS: GCS eye subscore is 3. GCS verbal  subscore is 4. GCS motor subscore is 6.     Motor: Tremor present.     ED Results / Procedures / Treatments   Labs (all labs ordered are listed, but only abnormal results are displayed) Labs Reviewed  BASIC METABOLIC PANEL - Abnormal; Notable for the following components:      Result Value   Sodium 129 (*)    Chloride 86 (*)    Glucose, Bld 112 (*)    Calcium 8.1 (*)    All other components within normal limits  CBC - Abnormal; Notable for the following components:   WBC 14.7 (*)    RBC 3.66 (*)    Hemoglobin 11.7 (*)    HCT 33.8 (*)    All other components within normal limits  HEPATIC FUNCTION PANEL - Abnormal; Notable for the following components:   Total Protein 6.2 (*)    Albumin 3.2 (*)    Bilirubin, Direct 0.3 (*)    All other components within normal limits  URINALYSIS, COMPLETE (UACMP) WITH MICROSCOPIC - Abnormal; Notable for the following  components:   Color, Urine STRAW (*)    Glucose, UA 150 (*)    All other components within normal limits  BASIC METABOLIC PANEL - Abnormal; Notable for the following components:   Sodium 128 (*)    Chloride 94 (*)    Glucose, Bld 124 (*)    Calcium 7.3 (*)    All other components within normal limits  GLUCOSE, CAPILLARY - Abnormal; Notable for the following components:   Glucose-Capillary 185 (*)    All other components within normal limits  I-STAT VENOUS BLOOD GAS, ED - Abnormal; Notable for the following components:   pCO2, Ven 72.4 (*)    Bicarbonate 33.2 (*)    TCO2 35 (*)    Acid-Base Excess 4.0 (*)    Sodium 129 (*)    Calcium, Ion 1.04 (*)    HCT 37.0 (*)    Hemoglobin 12.6 (*)    All other components within normal limits  I-STAT ARTERIAL BLOOD GAS, ED - Abnormal; Notable for the following components:   pH, Arterial 7.228 (*)    pCO2 arterial 67.7 (*)    pO2, Arterial 140 (*)    Bicarbonate 28.3 (*)    Sodium 131 (*)    Potassium 3.2 (*)    Calcium, Ion 1.08 (*)    HCT 34.0 (*)    Hemoglobin 11.6 (*)    All other components within normal limits  POCT I-STAT 7, (LYTES, BLD GAS, ICA,H+H) - Abnormal; Notable for the following components:   pH, Arterial 7.311 (*)    pCO2 arterial 55.6 (*)    Bicarbonate 28.3 (*)    Sodium 130 (*)    Potassium 3.2 (*)    Calcium, Ion 1.09 (*)    HCT 30.0 (*)    Hemoglobin 10.2 (*)    All other components within normal limits  CULTURE, BLOOD (ROUTINE X 2)  CULTURE, BLOOD (ROUTINE X 2)  CULTURE, RESPIRATORY W GRAM STAIN  RESPIRATORY PANEL BY PCR  MRSA NEXT GEN BY PCR, NASAL  BRAIN NATRIURETIC PEPTIDE  ETHANOL  SODIUM, URINE, RANDOM  CORTISOL  TSH  CBC  MAGNESIUM  PHOSPHORUS  OSMOLALITY, URINE  STREP PNEUMONIAE URINARY ANTIGEN  LEGIONELLA PNEUMOPHILA SEROGP 1 UR AG  PROCALCITONIN  BASIC METABOLIC PANEL  TRIGLYCERIDES  BLOOD GAS, ARTERIAL  BLOOD GAS, ARTERIAL  HEMOGLOBIN A1C  PROCALCITONIN  OSMOLALITY    EKG EKG  Interpretation  Date/Time:  Thursday  September 02 2022 12:13:43 EST Ventricular Rate:  118 PR Interval:  142 QRS Duration: 114 QT Interval:  344 QTC Calculation: 482 R Axis:   -81 Text Interpretation: Sinus tachycardia Left axis deviation Right bundle branch block Abnormal ECG When compared with ECG of 14-Apr-2022 16:43, No significant change since last tracing Confirmed by Alvira MondaySchlossman, Raeanna Soberanes ([id number]) on 09/02/2022 3:18:53 PM  Radiology CT Angio Chest Pulmonary Embolism (PE) W or WO Contrast  Result Date: 09/02/2022 CLINICAL DATA:  Hypoxia EXAM: CT ANGIOGRAPHY CHEST WITH CONTRAST TECHNIQUE: Multidetector CT imaging of the chest was performed using the standard protocol during bolus administration of intravenous contrast. Multiplanar CT image reconstructions and MIPs were obtained to evaluate the vascular anatomy. RADIATION DOSE REDUCTION: This exam was performed according to the departmental dose-optimization program which includes automated exposure control, adjustment of the mA and/or kV according to patient size and/or use of iterative reconstruction technique. CONTRAST:  75mL OMNIPAQUE IOHEXOL 350 MG/ML SOLN COMPARISON:  Chest radiograph dated 09/02/2022 FINDINGS: Cardiovascular: The study is high quality for the evaluation of pulmonary embolism. There are no filling defects in the central, lobar, segmental or subsegmental pulmonary artery branches to suggest acute pulmonary embolism. Great vessels are normal in course and caliber. Normal heart size. No significant pericardial fluid/thickening. Coronary artery calcifications and aortic atherosclerosis. Mediastinum/Nodes: Partially imaged thyroid gland without nodules meeting criteria for imaging follow-up by size. Normal esophagus. No pathologically enlarged axillary, supraclavicular, mediastinal, or hilar lymph nodes. Lungs/Pleura: ET tube terminates 1.9 cm above the carina. The central airways are patent. Mild upper lobe predominant centrilobular  emphysema. Layering secretions within the bilateral bronchi extending into all lobes where there are subsegmental mucous plugs. Diffuse bronchial wall thickening. Right upper lobe nodule measuring up to 8 mm (8:50). Multifocal bilateral lower lobe and lingular ground-glass nodules. Right lower lobe consolidation. No pneumothorax. No pleural effusion. Upper abdomen: Enteric tube terminates stomach. Calcified hepatic granulomas. Musculoskeletal: No acute or abnormal lytic or blastic osseous lesions. Review of the MIP images confirms the above findings. IMPRESSION: 1. No acute pulmonary embolism. 2. Right lower lobe pneumonia. 3. Layering secretions within the bilateral bronchi extending into all lobes where there are subsegmental mucous plugs. Multifocal bilateral lower lobe and lingular ground-glass nodules. Findings are suspicious for superimposed aspiration. 4. Right upper lobe nodule measuring up to 8 mm, likely infectious/inflammatory. Non-contrast chest CT at 6-12 months is recommended. If the nodule is stable at time of repeat CT, then future CT at 18-24 months (from today's scan) is considered optional for low-risk patients, but is recommended for high-risk patients. This recommendation follows the consensus statement: Guidelines for Management of Incidental Pulmonary Nodules Detected on CT Images: From the Fleischner Society 2017; Radiology 2017; 284:228-243. 5. Coronary artery calcifications. Aortic Atherosclerosis (ICD10-I70.0) and Emphysema (ICD10-J43.9). Electronically Signed   By: Agustin CreeLimin  Xu M.D.   On: 09/02/2022 20:14   CT HEAD WO CONTRAST (5MM)  Result Date: 09/02/2022 CLINICAL DATA:  Neuro deficit EXAM: CT HEAD WITHOUT CONTRAST TECHNIQUE: Contiguous axial images were obtained from the base of the skull through the vertex without intravenous contrast. RADIATION DOSE REDUCTION: This exam was performed according to the departmental dose-optimization program which includes automated exposure control,  adjustment of the mA and/or kV according to patient size and/or use of iterative reconstruction technique. COMPARISON:  MRI brain dated 03/18/2019 FINDINGS: Brain: No evidence of acute infarction, hemorrhage, hydrocephalus, extra-axial collection or mass lesion/mass effect. Mild cortical atrophy. Subcortical white matter and periventricular small vessel ischemic changes. Old left basal ganglia lacunar infarct.  Vascular: Intracranial atherosclerosis. Skull: Normal. Negative for fracture or focal lesion. Sinuses/Orbits: The visualized paranasal sinuses are essentially clear. The mastoid air cells are unopacified. Other: None. IMPRESSION: No evidence of acute intracranial abnormality. Atrophy with small vessel ischemic changes. Old left basal ganglia lacunar infarct. Electronically Signed   By: Charline Bills M.D.   On: 09/02/2022 20:01   DG Chest Portable 1 View  Result Date: 09/02/2022 CLINICAL DATA:  Endotracheal tube and gastric tube position EXAM: PORTABLE CHEST 1 VIEW COMPARISON:  Chest 09/02/2022 FINDINGS: Endotracheal tube 3 cm above the carina. NG tip in the gastric fundus Lungs remain clear without infiltrate effusion or edema. IMPRESSION: Endotracheal tube 3 cm above the carina. NG tip in the gastric fundus. Lungs are clear. Electronically Signed   By: Marlan Palau M.D.   On: 09/02/2022 16:32   DG Chest 2 View  Result Date: 09/02/2022 CLINICAL DATA:  Dyspnea EXAM: CHEST - 2 VIEW COMPARISON:  02/02/2022 FINDINGS: The heart size and mediastinal contours are within normal limits. Both lungs are clear. The visualized skeletal structures are unremarkable. IMPRESSION: No active cardiopulmonary disease. Electronically Signed   By: Ernie Avena M.D.   On: 09/02/2022 12:58    Procedures .Critical Care  Performed by: Alvira Monday, MD Authorized by: Alvira Monday, MD   Critical care provider statement:    Critical care time (minutes):  30   Critical care was time spent personally  by me on the following activities:  Development of treatment plan with patient or surrogate, evaluation of patient's response to treatment, examination of patient, ordering and review of laboratory studies, ordering and review of radiographic studies, ordering and performing treatments and interventions, pulse oximetry, re-evaluation of patient's condition and review of old charts     Medications Ordered in ED Medications  0.9 %  sodium chloride infusion (250 mLs Intravenous New Bag/Given 09/02/22 2015)  norepinephrine (LEVOPHED)  in (0.016 mg/mL) premix infusion (10 mcg/min Intravenous New Bag/Given 09/02/22 1843)  docusate sodium (COLACE) capsule 100 mg (has no administration in time range)  enoxaparin (LOVENOX) injection 40 mg (40 mg Subcutaneous Given 09/02/22 2025)  famotidine (PEPCID) tablet 20 mg (has no administration in time range)  thiamine (VITAMIN B1) injection 100 mg (100 mg Intravenous Given 09/02/22 2023)  folic acid injection 1 mg (1 mg Intravenous Given 09/02/22 2037)  PHENobarbital (LUMINAL) tablet 97.2 mg (97.2 mg Per Tube Given 09/02/22 2024)    Followed by  PHENobarbital (LUMINAL) tablet 64.8 mg (has no administration in time range)    Followed by  PHENobarbital (LUMINAL) tablet 32.4 mg (has no administration in time range)  methylPREDNISolone sodium succinate (SOLU-MEDROL) 40 mg/mL injection 40 mg (40 mg Intravenous Given 09/02/22 2026)  sodium chloride flush (NS) 0.9 % injection 3 mL (has no administration in time range)  sodium chloride flush (NS) 0.9 % injection 3 mL (has no administration in time range)  0.9 %  sodium chloride infusion (has no administration in time range)  docusate (COLACE) 50 MG/5ML liquid 100 mg (has no administration in time range)  polyethylene glycol (MIRALAX / GLYCOLAX) packet 17 g (17 g Per Tube Given 09/02/22 2044)  propofol (DIPRIVAN) 1000 MG/100ML infusion (20 mcg/kg/min  72.6 kg Intravenous Rate/Dose Change 09/02/22 1857)   fentaNYL (SUBLIMAZE) injection 25 mcg (25 mcg Intravenous Given 09/02/22 1942)  fentaNYL (SUBLIMAZE) injection 25-100 mcg (100 mcg Intravenous Given 09/02/22 2024)  midazolam (VERSED) injection 1-2 mg (2 mg Intravenous Given 09/02/22 2012)  azithromycin (ZITHROMAX) 500 mg in sodium chloride 0.9 %  250 mL IVPB (has no administration in time range)  cefTRIAXone (ROCEPHIN) 2 g in sodium chloride 0.9 % 100 mL IVPB (2 g Intravenous New Bag/Given 09/02/22 2033)  Chlorhexidine Gluconate Cloth 2 % PADS 6 each (6 each Topical Given 09/02/22 2007)  ipratropium-albuterol (DUONEB) 0.5-2.5 (3) MG/3ML nebulizer solution 3 mL (has no administration in time range)  albuterol (PROVENTIL) (2.5 MG/3ML) 0.083% nebulizer solution 2.5 mg (has no administration in time range)  insulin aspart (novoLOG) injection 0-9 Units (2 Units Subcutaneous Given 09/02/22 2038)  magnesium sulfate IVPB 2 g 50 mL (0 g Intravenous Stopped 09/02/22 1637)  methylPREDNISolone sodium succinate (SOLU-MEDROL) 125 mg/2 mL injection 125 mg (125 mg Intravenous Given 09/02/22 1511)  albuterol (PROVENTIL,VENTOLIN) solution continuous neb (10 mg/hr Nebulization Given 09/02/22 1438)  ipratropium (ATROVENT) nebulizer solution 1.5 mg (1.5 mg Nebulization Given 09/02/22 1438)  albuterol (PROVENTIL) (2.5 MG/3ML) 0.083% nebulizer solution (10 mLs Nebulization Given 09/02/22 1610)  ipratropium (ATROVENT) 0.02 % nebulizer solution (3 mLs Nebulization Given 09/02/22 1611)  lactated ringers bolus 1,000 mL (1,000 mLs Intravenous New Bag/Given 09/02/22 2015)  sodium chloride 0.9 % bolus 1,000 mL (0 mLs Intravenous Stopped 09/02/22 1740)  iohexol (OMNIPAQUE) 350 MG/ML injection 75 mL (75 mLs Intravenous Contrast Given 09/02/22 1956)    ED Course/ Medical Decision Making/ A&P                            66 year old male with history of alcohol abuse, hypertension, presents with concern for shortness of breath.    Differential diagnosis for dyspnea includes  ACS, PE, COPD exacerbation, CHF exacerbation, anemia, pneumonia, viral etiology such as COVID 19 infection, metabolic abnormality.  Chest x-ray was done and personally evaluated by me which showed no acute abnormalities. EKG was evaluated by me which showed tachycardia, no acute changes.  BNP was WNL.   Exam with significant wheezing, increased work of breathing. Placed on BiPAP and given nebulizers, solumedrol, Mg.  Sleepy on reevaluation, tremors noted. Concern for etoh withdrawal. Discussed with Dr. Silverio Lay, oncoming ED Physician. Plan for intubation for airway protection in the setting of encephalopathy, COPD exacerbation and etoh withdrawal. To order PE study, treatment etoh withdrawal, ICU admission.         Final Clinical Impression(s) / ED Diagnoses Final diagnoses:  Chronic obstructive pulmonary disease, unspecified COPD type (HCC)  Hypoxia  Alcohol withdrawal syndrome without complication [facility])    Rx / DC Orders ED Discharge Orders     None         Alvira Monday, MD 09/02/22 2212

## 2022-09-02 NOTE — ED Notes (Signed)
ED TO INPATIENT HANDOFF REPORT  ED Nurse Name and Phone #: Kaelem Brach   S Name/Age/Gender Ronald [name] 66 y.o. male Room/Bed: 019C/019C  Code Status   Code Status: Full Code  Home/SNF/Other Home Patient oriented to: self, place, and situation Is this baseline? No   Triage Complete: Triage complete  Chief Complaint Acute respiratory failure ([place]) [J96.00]  Triage Note Patient with history of COPD brought by family for further evaluation. Patient was supposed to have a chest x-ray but the imaging center was closed. Patient denies chest pain by nodding his head, is disoriented to date, audibly wheezing in triage. Room air SpO2 87%, placed on nasal cannula.   Allergies No Known Allergies  Level of Care/Admitting Diagnosis ED Disposition     ED Disposition  Admit   Condition  --   Comment  Hospital Area: [place] [[id number]]  Level of Care: ICU [6]  May admit patient to [facility] or [facility] if equivalent level of care is available:: No  Covid Evaluation: Symptomatic Person Under Investigation (PUI) or recent exposure (last 10 days) *Testing Required*  Diagnosis: Acute respiratory failure ([place]) [518.81.ICD-9-CM]  Admitting Physician: Freddi Starr [id number]  Attending Physician: Freddi Starr [id number]  Certification:: I certify this patient will need inpatient services for at least 2 midnights  Estimated Length of Stay: 4          B Medical/Surgery History Past Medical History:  Diagnosis Date   Critical lower limb ischemia ([place])    ETOH abuse    Gout    Hypertension    Past Surgical History:  Procedure Laterality Date   ABDOMINAL AORTAGRAM N/A 12/26/2013   Procedure: ABDOMINAL Maxcine Ham;  Surgeon: Serafina Mitchell, MD;  Location: [facility] CATH LAB;  Service: Cardiovascular;  Laterality: N/A;   AMPUTATION Left 12/28/2013   Procedure: LEFT BELOW KNEE AMPUTATION;  Surgeon: Newt Minion, MD;  Location: [facility];  Service: Orthopedics;   Laterality: Left;   BELOW KNEE LEG AMPUTATION     Left   LEG SURGERY       A IV Location/Drains/Wounds Patient Lines/Drains/Airways Status     Active Line/Drains/Airways     Name Placement date Placement time Site Days   Peripheral IV 09/02/22 18 G Anterior;Distal;Right;Upper Arm 09/02/22  1505  Arm  less than 1   Peripheral IV 09/02/22 20 G Anterior;Distal;Right Forearm 09/02/22  1506  Forearm  less than 1   Airway 7.5 mm --  --  -- --   Incision (Closed) 12/28/13 Leg Left 12/28/13  1654  -- 3170   Wound / Incision (Open or Dehisced) 11/06/13  Foot Left 11/06/13  2033  Foot  3222   Wound / Incision (Open or Dehisced) 11/06/13 Other (Comment) 11/06/13  2033  --  3222   Wound / Incision (Open or Dehisced) 12/25/13 Foot Left plantar ulcer, gangrene third toe,  12/25/13  1430  Foot  3173            Intake/Output Last 24 hours No intake or output data in the 24 hours ending 09/02/22 1816  Labs/Imaging Results for orders placed or performed during the hospital encounter of 09/02/22 (from the past 48 hour(s))  Basic metabolic panel     Status: Abnormal   Collection Time: 09/02/22 12:16 PM  Result Value Ref Range   Sodium 129 (L) 135 - 145 mmol/L   Potassium 3.5 3.5 - 5.1 mmol/L   Chloride 86 (L) 98 - 111 mmol/L   CO2 31  22 - 32 mmol/L   Glucose, Bld 112 (H) 70 - 99 mg/dL    Comment: Glucose reference range applies only to samples taken after fasting for at least 8 hours.   BUN 18 8 - 23 mg/dL   Creatinine, Ser 5.63 0.61 - 1.24 mg/dL   Calcium 8.1 (L) 8.9 - 10.3 mg/dL   GFR, Estimated >87 >56 mL/min    Comment: (NOTE) Calculated using the CKD-EPI Creatinine Equation (2021)    Anion gap 12 5 - 15    Comment: Performed at [facility], [address], [place], [place] [address]  CBC     Status: Abnormal   Collection Time: 09/02/22 12:16 PM  Result Value Ref Range   WBC 14.7 (H) 4.0 - 10.5 K/uL   RBC 3.66 (L) 4.22 - 5.81 MIL/uL   Hemoglobin 11.7 (L) 13.0 - 17.0 g/dL    HCT 51.8 (L) 84.1 - 52.0 %   MCV 92.3 80.0 - 100.0 fL   MCH 32.0 26.0 - 34.0 pg   MCHC 34.6 30.0 - 36.0 g/dL   RDW 66.0 63.0 - 16.0 %   Platelets 317 150 - 400 K/uL   nRBC 0.0 0.0 - 0.2 %    Comment: Performed at [facility], [address] [place], [place] [address]  Brain natriuretic peptide     Status: None   Collection Time: 09/02/22  1:00 PM  Result Value Ref Range   B Natriuretic Peptide 81.2 0.0 - 100.0 pg/mL    Comment: Performed at [facility], [address], [place], [place] [address]  I-Stat venous blood gas, [facility] ED, [facility], [facility])     Status: Abnormal   Collection Time: 09/02/22  3:08 PM  Result Value Ref Range   pH, Ven 7.269 7.25 - 7.43   pCO2, Ven 72.4 (HH) 44 - 60 mmHg   pO2, Ven 35 32 - 45 mmHg   Bicarbonate 33.2 (H) 20.0 - 28.0 mmol/L   TCO2 35 (H) 22 - 32 mmol/L   O2 Saturation 57 %   Acid-Base Excess 4.0 (H) 0.0 - 2.0 mmol/L   Sodium 129 (L) 135 - 145 mmol/L   Potassium 3.8 3.5 - 5.1 mmol/L   Calcium, Ion 1.04 (L) 1.15 - 1.40 mmol/L   HCT 37.0 (L) 39.0 - 52.0 %   Hemoglobin 12.6 (L) 13.0 - 17.0 g/dL   Sample type VENOUS    Comment NOTIFIED PHYSICIAN   I-Stat arterial blood gas, ED     Status: Abnormal   Collection Time: 09/02/22  5:39 PM  Result Value Ref Range   pH, Arterial 7.228 (L) 7.35 - 7.45   pCO2 arterial 67.7 (HH) 32 - 48 mmHg   pO2, Arterial 140 (H) 83 - 108 mmHg   Bicarbonate 28.3 (H) 20.0 - 28.0 mmol/L   TCO2 30 22 - 32 mmol/L   O2 Saturation 98 %   Acid-base deficit 1.0 0.0 - 2.0 mmol/L   Sodium 131 (L) 135 - 145 mmol/L   Potassium 3.2 (L) 3.5 - 5.1 mmol/L   Calcium, Ion 1.08 (L) 1.15 - 1.40 mmol/L   HCT 34.0 (L) 39.0 - 52.0 %   Hemoglobin 11.6 (L) 13.0 - 17.0 g/dL   Sample type ARTERIAL    Comment NOTIFIED PHYSICIAN    DG Chest Portable 1 View  Result Date: 09/02/2022 CLINICAL DATA:  Endotracheal tube and gastric tube position EXAM: PORTABLE CHEST 1 VIEW COMPARISON:  Chest 09/02/2022 FINDINGS: Endotracheal tube 3 cm  above the carina. NG tip in the gastric fundus Lungs remain clear without infiltrate effusion or edema. IMPRESSION: Endotracheal tube 3 cm above the carina. NG tip in the gastric fundus. Lungs are clear. Electronically Signed   By: Franchot Gallo M.D.   On: 09/02/2022 16:32   DG Chest 2 View  Result Date: 09/02/2022 CLINICAL DATA:  Dyspnea EXAM: CHEST - 2 VIEW COMPARISON:  02/02/2022 FINDINGS: The heart size and mediastinal contours are within normal limits. Both lungs are clear. The visualized skeletal structures are unremarkable. IMPRESSION: No active cardiopulmonary disease. Electronically Signed   By: Elmer Picker M.D.   On: 09/02/2022 12:58    Pending Labs Unresulted Labs (From admission, onward)     Start     Ordered   09/09/22 0500  Creatinine, serum  (enoxaparin (LOVENOX)    CrCl >/= 30 ml/min)  Weekly,   R     Comments: while on enoxaparin therapy    09/02/22 1722   09/03/22 0500  CBC  Tomorrow morning,   R        09/02/22 1722   09/03/22 0500  Magnesium  Tomorrow morning,   R        09/02/22 1722   09/03/22 0500  Phosphorus  Tomorrow morning,   R        09/02/22 1722   09/03/22 0500  Procalcitonin  Daily,   R      09/02/22 1722   09/03/22 0500  Triglycerides  (propofol (DIPRIVAN))  Every 72 hours,   R     Comments: While on propofol (DIPRIVAN)    09/02/22 1726   09/02/22 1735  Blood gas, arterial  Once,   R        09/02/22 1734   09/02/22 [id number]  Basic metabolic panel  5A & 5P,   R      09/02/22 1725   09/02/22 1721  Cortisol  ONCE - URGENT,   URGENT        09/02/22 1722   09/02/22 1721  Sodium, urine, random  Once,   R        09/02/22 1722   09/02/22 1721  Osmolality, urine  Once,   R        09/02/22 1722   09/02/22 1721  Osmolality  Once,   R        09/02/22 1722   09/02/22 1721  TSH  Once,   R        09/02/22 1722   09/02/22 1721  Culture, blood (Routine X 2) w Reflex to ID Panel  BLOOD CULTURE X 2,   R      09/02/22 1722   09/02/22 1721  Culture,  Respiratory w Gram Stain (tracheal aspirate)  Once,   R        09/02/22 1722   09/02/22 1721  Urinalysis, Complete w Microscopic  Once,   R        09/02/22 1722   09/02/22 1721  Urinalysis, Routine w reflex microscopic  Once,   R        09/02/22 1722   09/02/22 1721  Strep pneumoniae urinary antigen (not at [facility])  Once,   R        09/02/22 1722   09/02/22 1721  Legionella Pneumophila Serogp 1 Ur Ag  Once,   R        09/02/22 1722   09/02/22 1721  Procalcitonin - Baseline  ONCE - URGENT,   URGENT  09/02/22 1722   09/02/22 1721  Respiratory (~20 pathogens) panel by PCR  (Respiratory panel by PCR (~20 pathogens, ~24 hr TAT)  w precautions)  Once,   R        09/02/22 1722   09/02/22 1537  Hepatic function panel  Once,   URGENT        09/02/22 1536   09/02/22 1537  Ethanol  Once,   URGENT        09/02/22 1536            Vitals/Pain Today's Vitals   09/02/22 1645 09/02/22 1700 09/02/22 1722 09/02/22 1745  BP: (!) 168/86 (!) 141/84  (!) 154/94  Pulse: (!) 110 (!) 103  (!) 113  Resp: 15 15  (!) 22  Temp:   98.2 F (36.8 C)   TempSrc:   Axillary   SpO2: 100% 100%  100%  Weight:   72.6 kg   Height:      PainSc:        Isolation Precautions Droplet precaution  Medications Medications  lactated ringers bolus 1,000 mL (has no administration in time range)  0.9 %  sodium chloride infusion (has no administration in time range)  norepinephrine (LEVOPHED) 4mg  in 259mL (0.016 mg/mL) premix infusion (has no administration in time range)  norepinephrine (LEVOPHED) 4mg  in 264mL (0.016 mg/mL) premix infusion (0 mcg/min Intravenous Stopped 09/02/22 1630)  etomidate (AMIDATE) injection (20 mg Intravenous Given 09/02/22 1554)  rocuronium (ZEMURON) injection (80 mg Intravenous Given 09/02/22 1555)  docusate sodium (COLACE) capsule 100 mg (has no administration in time range)  enoxaparin (LOVENOX) injection 40 mg (has no administration in time range)  famotidine (PEPCID) tablet 20 mg  (has no administration in time range)  ipratropium-albuterol (DUONEB) 0.5-2.5 (3) MG/3ML nebulizer solution 3 mL (3 mLs Nebulization Given 09/02/22 1742)  thiamine (VITAMIN B1) injection 100 mg (has no administration in time range)  folic acid injection 1 mg (has no administration in time range)  PHENobarbital (LUMINAL) tablet 97.2 mg (has no administration in time range)    Followed by  phenobarbital (LUMINAL) tablet 64.8 mg (has no administration in time range)    Followed by  phenobarbital (LUMINAL) tablet 32.4 mg (has no administration in time range)  methylPREDNISolone sodium succinate (SOLU-MEDROL) 40 mg/mL injection 40 mg (has no administration in time range)  sodium chloride flush (NS) 0.9 % injection 3 mL (has no administration in time range)  sodium chloride flush (NS) 0.9 % injection 3 mL (has no administration in time range)  0.9 %  sodium chloride infusion (has no administration in time range)  docusate (COLACE) 50 MG/5ML liquid 100 mg (has no administration in time range)  polyethylene glycol (MIRALAX / GLYCOLAX) packet 17 g (has no administration in time range)  propofol (DIPRIVAN) 1000 MG/100ML infusion (has no administration in time range)  fentaNYL (SUBLIMAZE) injection 25 mcg (has no administration in time range)  fentaNYL (SUBLIMAZE) injection 25-100 mcg (has no administration in time range)  midazolam (VERSED) injection 1-2 mg (has no administration in time range)  azithromycin (ZITHROMAX) 500 mg in sodium chloride 0.9 % 250 mL IVPB (has no administration in time range)  cefTRIAXone (ROCEPHIN) 2 g in sodium chloride 0.9 % 100 mL IVPB (has no administration in time range)  magnesium sulfate IVPB 2 g 50 mL (0 g Intravenous Stopped 09/02/22 1637)  methylPREDNISolone sodium succinate (SOLU-MEDROL) 125 mg/2 mL injection 125 mg (125 mg Intravenous Given 09/02/22 1511)  albuterol (PROVENTIL,VENTOLIN) solution continuous neb (10 mg/hr Nebulization Given  09/02/22 1438)   ipratropium (ATROVENT) nebulizer solution 1.5 mg (1.5 mg Nebulization Given 09/02/22 1438)  albuterol (PROVENTIL) (2.5 MG/3ML) 0.083% nebulizer solution (10 mLs Nebulization Given 09/02/22 1610)  ipratropium (ATROVENT) 0.02 % nebulizer solution (3 mLs Nebulization Given 09/02/22 1611)  sodium chloride 0.9 % bolus 1,000 mL (0 mLs Intravenous Stopped 09/02/22 1740)    Mobility  High fall risk   Focused Assessments Pulmonary Assessment Handoff:  Lung sounds: Bilateral Breath Sounds: Expiratory wheezes L Breath Sounds: Expiratory wheezes R Breath Sounds: Expiratory wheezes O2 Device: Ventilator O2 Flow Rate (L/min): 3 L/min    R Recommendations: See Admitting Provider Note  Report given to:   Additional Notes:

## 2022-09-03 ENCOUNTER — Inpatient Hospital Stay (HOSPITAL_COMMUNITY): Payer: Medicare Other

## 2022-09-03 ENCOUNTER — Inpatient Hospital Stay: Payer: Self-pay

## 2022-09-03 DIAGNOSIS — R0603 Acute respiratory distress: Secondary | ICD-10-CM | POA: Diagnosis not present

## 2022-09-03 DIAGNOSIS — R0602 Shortness of breath: Secondary | ICD-10-CM | POA: Diagnosis not present

## 2022-09-03 DIAGNOSIS — J9601 Acute respiratory failure with hypoxia: Secondary | ICD-10-CM

## 2022-09-03 DIAGNOSIS — J189 Pneumonia, unspecified organism: Secondary | ICD-10-CM | POA: Diagnosis not present

## 2022-09-03 LAB — ECHOCARDIOGRAM COMPLETE
Area-P 1/2: 4.71 cm2
Height: 64 in
MV M vel: 2.85 m/s
MV Peak grad: 32.4 mmHg
S' Lateral: 2.1 cm
Weight: 1823.65 oz

## 2022-09-03 LAB — RESPIRATORY PANEL BY PCR

## 2022-09-03 LAB — BASIC METABOLIC PANEL
Anion gap: 11 (ref 5–15)
Anion gap: 8 (ref 5–15)
BUN: 14 mg/dL (ref 8–23)
BUN: 14 mg/dL (ref 8–23)
CO2: 27 mmol/L (ref 22–32)
CO2: 26 mmol/L (ref 22–32)
Calcium: 7.6 mg/dL — ABNORMAL LOW (ref 8.9–10.3)
Calcium: 7.8 mg/dL — ABNORMAL LOW (ref 8.9–10.3)
Chloride: 94 mmol/L — ABNORMAL LOW (ref 98–111)
Chloride: 96 mmol/L — ABNORMAL LOW (ref 98–111)
Creatinine, Ser: 0.66 mg/dL (ref 0.61–1.24)
Creatinine, Ser: 0.68 mg/dL (ref 0.61–1.24)
GFR, Estimated: >60 mL/min (ref >60)
GFR, Estimated: >60 mL/min (ref >60)
Glucose, Bld: 139 mg/dL — ABNORMAL HIGH (ref 70–99)
Glucose, Bld: 119 mg/dL — ABNORMAL HIGH (ref 70–99)
Potassium: 3.2 mmol/L — ABNORMAL LOW (ref 3.5–5.1)
Potassium: 5.1 mmol/L (ref 3.5–5.1)
Sodium: 132 mmol/L — ABNORMAL LOW (ref 135–145)
Sodium: 130 mmol/L — ABNORMAL LOW (ref 135–145)

## 2022-09-03 LAB — GLUCOSE, CAPILLARY
Glucose-Capillary: 104 mg/dL — ABNORMAL HIGH (ref 70–99)
Glucose-Capillary: 114 mg/dL — ABNORMAL HIGH (ref 70–99)
Glucose-Capillary: 114 mg/dL — ABNORMAL HIGH (ref 70–99)
Glucose-Capillary: 115 mg/dL — ABNORMAL HIGH (ref 70–99)
Glucose-Capillary: 117 mg/dL — ABNORMAL HIGH (ref 70–99)
Glucose-Capillary: 140 mg/dL — ABNORMAL HIGH (ref 70–99)

## 2022-09-03 LAB — PROCALCITONIN: Procalcitonin: <0.1 ng/mL

## 2022-09-03 LAB — MAGNESIUM
Magnesium: 1.3 mg/dL — ABNORMAL LOW (ref 1.7–2.4)
Magnesium: 2.4 mg/dL (ref 1.7–2.4)
Magnesium: 2.2 mg/dL (ref 1.7–2.4)

## 2022-09-03 LAB — CBC
HCT: 28.9 % — ABNORMAL LOW (ref 39.0–52.0)
Hemoglobin: 10.1 g/dL — ABNORMAL LOW (ref 13.0–17.0)
MCH: 32 pg (ref 26.0–34.0)
MCHC: 34.9 g/dL (ref 30.0–36.0)
MCV: 91.5 fL (ref 80.0–100.0)
Platelets: 278 10*3/uL (ref 150–400)
RBC: 3.16 MIL/uL — ABNORMAL LOW (ref 4.22–5.81)
RDW: 12.6 % (ref 11.5–15.5)
WBC: 10.6 10*3/uL — ABNORMAL HIGH (ref 4.0–10.5)
nRBC: 0 % (ref 0.0–0.2)

## 2022-09-03 LAB — PHOSPHORUS
Phosphorus: 2.5 mg/dL (ref 2.5–4.6)
Phosphorus: 2.8 mg/dL (ref 2.5–4.6)

## 2022-09-03 LAB — TRIGLYCERIDES: Triglycerides: 36 mg/dL (ref <150)

## 2022-09-03 LAB — OSMOLALITY: Osmolality: 279 mOsm/kg (ref 275–295)

## 2022-09-03 LAB — OSMOLALITY, URINE: Osmolality, Ur: 361 mOsm/kg (ref 300–900)

## 2022-09-03 MED ORDER — POTASSIUM CHLORIDE 20 MEQ PO PACK
40.0000 meq | PACK | Freq: Once | ORAL | Status: AC
  Administered 2022-09-03: 40 meq
  Filled 2022-09-03: qty 2

## 2022-09-03 MED ORDER — ORAL CARE MOUTH RINSE
15.0000 mL | OROMUCOSAL | Status: DC
  Administered 2022-09-03 – 2022-09-06 (×39): 15 mL via OROMUCOSAL

## 2022-09-03 MED ORDER — PROSOURCE TF20 ENFIT COMPATIBL EN LIQD
60.0000 mL | Freq: Every day | ENTERAL | Status: DC
  Administered 2022-09-03 – 2022-09-11 (×6): 60 mL
  Filled 2022-09-03 (×5): qty 60

## 2022-09-03 MED ORDER — MAGNESIUM SULFATE 4 GM/100ML IV SOLN
4.0000 g | Freq: Once | INTRAVENOUS | Status: AC
  Administered 2022-09-03: 4 g via INTRAVENOUS
  Filled 2022-09-03: qty 100

## 2022-09-03 MED ORDER — ORAL CARE MOUTH RINSE
15.0000 mL | OROMUCOSAL | Status: DC | PRN
  Administered 2022-09-07 – 2022-09-08 (×2): 15 mL via OROMUCOSAL

## 2022-09-03 MED ORDER — OSMOLITE 1.5 CAL PO LIQD
1000.0000 mL | ORAL | Status: DC
  Administered 2022-09-03 – 2022-09-08 (×2): 1000 mL
  Filled 2022-09-03 (×12): qty 1000

## 2022-09-03 MED ORDER — POTASSIUM CHLORIDE 10 MEQ/100ML IV SOLN
10.0000 meq | INTRAVENOUS | Status: AC
  Administered 2022-09-03 (×4): 10 meq via INTRAVENOUS
  Filled 2022-09-03 (×4): qty 100

## 2022-09-03 MED ORDER — MAGNESIUM SULFATE 2 GM/50ML IV SOLN
2.0000 g | Freq: Once | INTRAVENOUS | Status: AC
  Administered 2022-09-03: 2 g via INTRAVENOUS
  Filled 2022-09-03: qty 50

## 2022-09-03 NOTE — Progress Notes (Signed)
Initial Nutrition Assessment  DOCUMENTATION CODES:   Severe malnutrition in context of chronic illness  INTERVENTION:   Initiate tube feeds via OG tube: - Start Osmolite 1.5 @ 20 ml/hr and advance rate by 10 ml q 8 hours to goal rate of 50 ml/hr (1200 ml/day) - PROSource TF20 60 ml daily  Tube feeding regimen at goal rate provides 1880 kcal, 95 grams of protein, and 914 ml of H2O.   Monitor magnesium, potassium, and phosphorus BID for at least 3 days, MD to replete as needed, as pt is at risk for refeeding syndrome given severe malnutrition, ETOH abuse.  - MVI with minerals daily per tube  - Continue folic acid and thiamine  NUTRITION DIAGNOSIS:   Severe Malnutrition related to chronic illness (COPD, ETOH abuse) as evidenced by severe fat depletion, severe muscle depletion, percent weight loss (28.8% weight loss in 7 months).  GOAL:   Patient will meet greater than or equal to 90% of their needs  MONITOR:   Vent status, Labs, Weight trends, TF tolerance  REASON FOR ASSESSMENT:   Ventilator, Consult Enteral/tube feeding initiation and management  ASSESSMENT:   66 year old male who presented to the ED on 12/21 with AMS, SOB. Pt required intubation in the ED. PMH COPD, ETOH abuse, L BKA. Pt admitted with acute respiratory failure with hypercarbia, PNA, hyponatremia, acute metabolic encephalopathy.  Consult received for enteral nutrition initiation and management. Pt with OG tube in stomach per x-ray.  Unable to obtain diet and weight history at this time. Reviewed weight history in chart. Pt with a 20.9 kg weight loss since 02/02/22. This is a 28.8% weight loss in 7 months which is severe and significant for timeframe. Pt meets criteria for severe malnutrition.  Pt is at refeeding risk. Will start tube feeds at trickle rate and slowly advance to goal. Discussed with RN.  Patient is currently intubated on ventilator support MV: 10.6 L/min Temp (24hrs), Avg:98.1 F (36.7  C), Min:97.3 F (36.3 C), Max:99 F (37.2 C)  Drips: Propofol: 25 mcg/kg/min (provides 287 kcal daily from lipid) Fentanyl  Medications reviewed and include: colace, pepcid, IV folic acid, SSI q 4 hours, IV solu-medrol, phenobarbital taper, miralax, IV thiamine, IV abx, klor-con 40 mEq x 1, IV magnesium sulfate 4 grams x 1 followed by 2 grams x 1  Labs reviewed: sodium 132, potassium 3.2, ionized calcium 1.09, magnesium 1.3, WBC 10.6 CBG's: 104-185 x  24 hours  UOP: 770 ml x 12 hours I/O's: +318 ml since admit  NUTRITION - FOCUSED PHYSICAL EXAM:  Flowsheet Row Most Recent Value  Orbital Region Moderate depletion  Upper Arm Region Severe depletion  Thoracic and Lumbar Region Severe depletion  Buccal Region Unable to assess  Temple Region Severe depletion  Clavicle Bone Region Moderate depletion  Clavicle and Acromion Bone Region Severe depletion  Scapular Bone Region Moderate depletion  Dorsal Hand Moderate depletion  Patellar Region Severe depletion  [RLE only due to L BKA]  Anterior Thigh Region Severe depletion  Posterior Calf Region Severe depletion  Edema (RD Assessment) None  Hair Reviewed  Eyes Reviewed  Mouth Reviewed  Skin Reviewed  Nails Reviewed       Diet Order:   Diet Order             Diet NPO time specified  Diet effective now                   EDUCATION NEEDS:   Not appropriate for education at this time  Skin:  Skin Assessment: Reviewed RN Assessment  Last BM:  no documented BM  Height:   Ht Readings from Last 1 Encounters:  09/02/22 5\' 4"  (1.626 m)    Weight:   Wt Readings from Last 1 Encounters:  09/03/22 51.7 kg    BMI:  Body mass index is 19.56 kg/m.  Estimated Nutritional Needs:   Kcal:  1750-1950  Protein:  85-100 grams  Fluid:  1.7-1.9 L    09/05/22, MS, RD, LDN Inpatient Clinical Dietitian Please see [facility] for contact information.

## 2022-09-03 NOTE — Progress Notes (Signed)
NAME:  Ronald [name], MRN:  [id number], DOB:  [birth year], LOS: 1 ADMISSION DATE:  09/02/2022, CONSULTATION DATE:  09/02/2022 REFERRING MD:  Silverio Lay, MD CHIEF COMPLAINT: Respiratory Failure  History of Present Illness:  Ronald [name] is a 66 y.o. Male with a PMH of COPD, ETOH and substance abuse, Left BKA, Smoker, who presented to the ED with hypoxia. Per family, pt was supposed to have a CXR completed today per PCP, but it was closed and when the patient's daughter picked him up, she noticed his WOB was increasing worse than normal. On arrival, he was noted to be disoriented to date and having audible wheezing, SHOB, and cough with Spo2 of 87%.    EDP concerned for alcohol withdrawal seizure but was too somnolent for sedation without requiring intubation. Therefore, pt was started on BiPAP and then intubated d/t worsening respiratory status. ED workup reveled Na 129, Chloride 86, WBC 14.7. VBG 7.269/72.4/35/33.2. CXR with no acute findings.   PCCM consulted for management of respiratory failure and mechanical ventilation.   Pertinent  Medical History  COPD, ETOH and substance abuse, Left BKA, Smoker  Significant Hospital Events: Including procedures, antibiotic start and stop dates in addition to other pertinent events   12/21 Admitted, Failed BiPAP > Intubated, CT Angio Ordered, azithromycin and rocephin started    Interim History / Subjective:  Patient intubated and sedated.   Objective   Blood pressure 101/68, pulse 90, temperature 98.1 F (36.7 C), temperature source Axillary, resp. rate (!) 22, height 5\' 4"  (1.626 m), weight 51.7 kg, SpO2 98 %.    Vent Mode: PRVC FiO2 (%):  [40 %-100 %] 40 % Set Rate:  [15 bmp-22 bmp] 22 bmp Vt Set:  [470 mL] 470 mL PEEP:  [5 cmH20] 5 cmH20 Plateau Pressure:  [15 cmH20-18 cmH20] 18 cmH20   Intake/Output Summary (Last 24 hours) at 09/03/2022 09/05/2022 Last data filed at 09/03/2022 0700 Gross per 24 hour  Intake 971 ml  Output 770 ml  Net 201 ml    Filed Weights   09/02/22 1722 09/02/22 2100 09/03/22 0500  Weight: 72.6 kg 51.7 kg 51.7 kg    Examination: General: intubated, ill-appearing male, in no acute distress HENT: ETT in place Lungs: diminished breath sounds, mechanically ventilated  Cardiovascular: RRR Extremities: left BKA, pitting edema of R LE Neuro: sedated  Resolved Hospital Problem list     Assessment & Plan:  Acute Respiratory Failure w/ Hypercarbia Afebrile and leukocytosis trending down. Off pressor. Negative RVP and urine strep pneumo. Procal <0.10. Hypercarbia improving with last ABG: 7.311/55.6/84/28.3. CTA negative for PE, did show RLL pneumonia and multifocal bilateral lower lobe and lingular ground-glass nodules suspicious for aspiration, RUL nodule 80mm.  -solumedrol 40 mg daily -duoneb q6 -azithromycin and rocephin (day 2) -pend legionella  -f/u blood cultures, urine culture, trach asp culture -VAP protocol -PAD protocol with propofol and fentanyl infusion, RASS goal (0, -1)  Hyponatremia, chronic Na 132 today from admission of 129. Suspicion secondary to beer potomania, will check for SIADH. Cortisol 15.1. TSH normal.  -f/u urine Na and osmo, serum osmo  Acute metabolic encephalopathy superimposed on Chronic Alcohol Abuse w/ concern for withdrawal  -thiamine and folic acid -phenobarbital taper  Hypertension/Transient Drug Induced Hypotension  -holding home meds  Best Practice (right click and "Reselect all SmartList Selections" daily)   Diet/type: NPO w/ meds via tube DVT prophylaxis: LMWH GI prophylaxis: N/A Lines: N/A Foley:  Yes, and it is still needed Code Status:  full code Last date of  multidisciplinary goals of care discussion [12/21: see IPAL note]  Labs   CBC: Recent Labs  Lab 09/02/22 1216 09/02/22 1508 09/02/22 1739 09/02/22 2154 09/03/22 0415  WBC 14.7*  --   --   --  10.6*  HGB 11.7* 12.6* 11.6* 10.2* 10.1*  HCT 33.8* 37.0* 34.0* 30.0* 28.9*  MCV 92.3  --   --    --  91.5  PLT 317  --   --   --  278    Basic Metabolic Panel: Recent Labs  Lab 09/02/22 1216 09/02/22 1508 09/02/22 1726 09/02/22 1739 09/02/22 2154 09/03/22 0413 09/03/22 0415  NA 129* 129* 128* 131* 130* 132*  --   K 3.5 3.8 3.5 3.2* 3.2* 3.2*  --   CL 86*  --  94*  --   --  94*  --   CO2 31  --  26  --   --  27  --   GLUCOSE 112*  --  124*  --   --  139*  --   BUN 18  --  17  --   --  14  --   CREATININE 1.15  --  0.82  --   --  0.66  --   CALCIUM 8.1*  --  7.3*  --   --  7.6*  --   MG  --   --   --   --   --   --  1.3*  PHOS  --   --   --   --   --   --  2.5   GFR: Estimated Creatinine Clearance: 67.3 mL/min (by C-G formula based on SCr of 0.66 mg/dL). Recent Labs  Lab 09/02/22 1216 09/02/22 2214 09/03/22 0415  PROCALCITON  --  <0.10 <0.10  WBC 14.7*  --  10.6*    Liver Function Tests: Recent Labs  Lab 09/02/22 1726  AST 25  ALT 11  ALKPHOS 71  BILITOT 1.2  PROT 6.2*  ALBUMIN 3.2*   No results for input(s): "LIPASE", "AMYLASE" in the last 168 hours. No results for input(s): "AMMONIA" in the last 168 hours.  ABG    Component Value Date/Time   PHART 7.311 (L) 09/02/2022 2154   PCO2ART 55.6 (H) 09/02/2022 2154   PO2ART 84 09/02/2022 2154   HCO3 28.3 (H) 09/02/2022 2154   TCO2 30 09/02/2022 2154   ACIDBASEDEF 1.0 09/02/2022 1739   O2SAT 95 09/02/2022 2154     Coagulation Profile: No results for input(s): "INR", "PROTIME" in the last 168 hours.  Cardiac Enzymes: No results for input(s): "CKTOTAL", "CKMB", "CKMBINDEX", "TROPONINI" in the last 168 hours.  HbA1C: Hgb A1c MFr Bld  Date/Time Value Ref Range Status  11/09/2013 01:00 PM 5.9 (H) <5.7 % Final    Comment:    (NOTE)                                                                       According to the ADA Clinical Practice Recommendations for 2011, when HbA1c is used as a screening test:  >=6.5%   Diagnostic of Diabetes Mellitus           (if abnormal result is confirmed) 5.7-6.4%    Increased risk of developing Diabetes  [place] References:Diagnosis and Classification of Diabetes Mellitus,Diabetes Care,2011,34(Suppl 1):S62-S69 and Standards of Medical Care in         Diabetes - 2011,Diabetes Care,2011,34 (Suppl 1):S11-S61.    CBG: Recent Labs  Lab 09/02/22 2014 09/02/22 2339 09/03/22 0341  GLUCAP 185* 166* 140*    Past Medical History:  He,  has a past medical history of Critical lower limb ischemia (HCC), ETOH abuse, Gout, and Hypertension.   Surgical History:   Past Surgical History:  Procedure Laterality Date   ABDOMINAL AORTAGRAM N/A 12/26/2013   Procedure: ABDOMINAL AORTAGRAM;  Surgeon: [name], MD;  Location: [facility] CATH LAB;  Service: Cardiovascular;  Laterality: N/A;   AMPUTATION Left 12/28/2013   Procedure: LEFT BELOW KNEE AMPUTATION;  Surgeon: Nadara Mustard, MD;  Location: [facility] OR;  Service: Orthopedics;  Laterality: Left;   BELOW KNEE LEG AMPUTATION     Left   LEG SURGERY       Social History:   reports that he has been smoking cigarettes. He has a 20.00 pack-year smoking history. He has never used smokeless tobacco. He reports current alcohol use of about 120.0 standard drinks of alcohol per week. He reports current drug use. Drugs: Cocaine and Marijuana.   Family History:  His family history includes Diabetes Mellitus II in his mother. There is no history of CAD or Stroke.   Allergies No Known Allergies   Home Medications  Prior to Admission medications   Medication Sig Start Date End Date Taking? Authorizing Provider  lisinopril (ZESTRIL) 10 MG tablet Take 10 mg by mouth daily. 08/26/22  Yes [provider]  predniSONE (DELTASONE) 20 MG tablet Take 40 mg by mouth daily. 08/25/22  Yes [provider]  ADVAIR HFA 230-21 MCG/ACT inhaler Inhale 2 puffs into the lungs 2 (two) times daily. 04/16/22   Burnadette Pop, MD  albuterol (VENTOLIN HFA) 108 (90 Base) MCG/ACT inhaler Inhale 2 puffs into the lungs every 4 (four) hours  as needed for wheezing or shortness of breath. 04/16/22   Burnadette Pop, MD  folic acid (FOLVITE) 1 MG tablet Take 1 tablet (1 mg total) by mouth daily. 04/17/22   Burnadette Pop, MD  ibuprofen (ADVIL) 200 MG tablet Take 200 mg by mouth every 6 (six) hours as needed for mild pain.    [provider]  thiamine (VITAMIN B1) 100 MG tablet Take 1 tablet (100 mg total) by mouth daily. 04/17/22   Burnadette Pop, MD

## 2022-09-03 NOTE — Progress Notes (Signed)
  Echocardiogram 2D Echocardiogram has been performed.  Ronald [name] 09/03/2022, 2:12 PM

## 2022-09-03 NOTE — Progress Notes (Signed)
[facility] ADULT ICU REPLACEMENT PROTOCOL   The patient does apply for the [facility] Adult ICU Electrolyte Replacment Protocol based on the criteria listed below:   1.Exclusion criteria: TCTS, ECMO, Dialysis, and Myasthenia Gravis patients 2. Is GFR >/= 30 ml/min? Yes.    Patient's GFR today is >60 3. Is SCr </= 2? Yes.   Patient's SCr is 0.86 mg/dL 4. Did SCr increase >/= 0.5 in 24 hours? No 5.Pt's weight >40kg  Yes.   6. Abnormal electrolyte(s): mag 1.3, K+ 3.2  7. Electrolytes replaced per protocol 8.  Call MD STAT for K+ </= 2.5, Phos </= 1, or Mag </= 1 Physician:  n/a  Melvern Banker 09/03/2022 5:33 AM

## 2022-09-04 DIAGNOSIS — E43 Unspecified severe protein-calorie malnutrition: Secondary | ICD-10-CM | POA: Insufficient documentation

## 2022-09-04 DIAGNOSIS — R0602 Shortness of breath: Secondary | ICD-10-CM | POA: Diagnosis not present

## 2022-09-04 DIAGNOSIS — J9601 Acute respiratory failure with hypoxia: Secondary | ICD-10-CM | POA: Diagnosis not present

## 2022-09-04 DIAGNOSIS — J189 Pneumonia, unspecified organism: Principal | ICD-10-CM

## 2022-09-04 LAB — MAGNESIUM
Magnesium: 1.9 mg/dL (ref 1.7–2.4)
Magnesium: 1.7 mg/dL (ref 1.7–2.4)

## 2022-09-04 LAB — HEMOGLOBIN A1C
Hgb A1c MFr Bld: 5.6 % (ref 4.8–5.6)
Mean Plasma Glucose: 114 mg/dL

## 2022-09-04 LAB — BASIC METABOLIC PANEL
Anion gap: 8 (ref 5–15)
Anion gap: 8 (ref 5–15)
BUN: 22 mg/dL (ref 8–23)
BUN: 22 mg/dL (ref 8–23)
CO2: 26 mmol/L (ref 22–32)
CO2: 28 mmol/L (ref 22–32)
Calcium: 7.9 mg/dL — ABNORMAL LOW (ref 8.9–10.3)
Calcium: 7.9 mg/dL — ABNORMAL LOW (ref 8.9–10.3)
Chloride: 98 mmol/L (ref 98–111)
Chloride: 98 mmol/L (ref 98–111)
Creatinine, Ser: 0.68 mg/dL (ref 0.61–1.24)
Creatinine, Ser: 0.64 mg/dL (ref 0.61–1.24)
GFR, Estimated: >60 mL/min (ref >60)
GFR, Estimated: >60 mL/min (ref >60)
Glucose, Bld: 106 mg/dL — ABNORMAL HIGH (ref 70–99)
Glucose, Bld: 123 mg/dL — ABNORMAL HIGH (ref 70–99)
Potassium: 4.7 mmol/L (ref 3.5–5.1)
Potassium: 5.3 mmol/L — ABNORMAL HIGH (ref 3.5–5.1)
Sodium: 132 mmol/L — ABNORMAL LOW (ref 135–145)
Sodium: 134 mmol/L — ABNORMAL LOW (ref 135–145)

## 2022-09-04 LAB — GLUCOSE, CAPILLARY
Glucose-Capillary: 111 mg/dL — ABNORMAL HIGH (ref 70–99)
Glucose-Capillary: 105 mg/dL — ABNORMAL HIGH (ref 70–99)
Glucose-Capillary: 126 mg/dL — ABNORMAL HIGH (ref 70–99)
Glucose-Capillary: 128 mg/dL — ABNORMAL HIGH (ref 70–99)
Glucose-Capillary: 117 mg/dL — ABNORMAL HIGH (ref 70–99)

## 2022-09-04 LAB — PHOSPHORUS
Phosphorus: 2.6 mg/dL (ref 2.5–4.6)
Phosphorus: 3.1 mg/dL (ref 2.5–4.6)

## 2022-09-04 LAB — PROCALCITONIN: Procalcitonin: <0.1 ng/mL

## 2022-09-04 MED ORDER — SODIUM ZIRCONIUM CYCLOSILICATE 5 G PO PACK
5.0000 g | PACK | Freq: Once | ORAL | Status: AC
  Administered 2022-09-04: 5 g
  Filled 2022-09-04: qty 1

## 2022-09-04 NOTE — Progress Notes (Signed)
NAME:  Ronald [name], MRN:  [id number], DOB:  [birth year], LOS: 2 ADMISSION DATE:  09/02/2022, CONSULTATION DATE:  09/02/2022 REFERRING MD:  Silverio Lay, MD CHIEF COMPLAINT: Respiratory Failure  History of Present Illness:  Ronald [name] is a 66 y.o. Male with a PMH of COPD, ETOH and substance abuse, Left BKA, Smoker, who presented to the ED with hypoxia. Per family, pt was supposed to have a CXR completed today per PCP, but it was closed and when the patient's daughter picked him up, she noticed his WOB was increasing worse than normal. On arrival, he was noted to be disoriented to date and having audible wheezing, SHOB, and cough with Spo2 of 87%.    EDP concerned for alcohol withdrawal seizure but was too somnolent for sedation without requiring intubation. Therefore, pt was started on BiPAP and then intubated d/t worsening respiratory status. ED workup reveled Na 129, Chloride 86, WBC 14.7. VBG 7.269/72.4/35/33.2. CXR with no acute findings.   PCCM consulted for management of respiratory failure and mechanical ventilation.   Pertinent  Medical History  COPD, ETOH and substance abuse, Left BKA, Smoker  Significant Hospital Events: Including procedures, antibiotic start and stop dates in addition to other pertinent events   12/21 Admitted, Failed BiPAP > Intubated, CT Angio Ordered, azithromycin and rocephin started  12/23 failed weaning attempt   Interim History / Subjective:  Intubated and sedated Failed weaning attempts, did not tolerate any reduction in sedative medications  Objective   Blood pressure (!) 93/59, pulse 83, temperature 98.8 F (37.1 C), resp. rate (!) 22, height 5\' 4"  (1.626 m), weight 51.7 kg, SpO2 98 %.    Vent Mode: PRVC FiO2 (%):  [40 %] 40 % Set Rate:  [22 bmp] 22 bmp Vt Set:  [470 mL] 470 mL PEEP:  [5 cmH20] 5 cmH20 Plateau Pressure:  [17 cmH20-22 cmH20] 20 cmH20   Intake/Output Summary (Last 24 hours) at 09/04/2022 1332 Last data filed at 09/04/2022  1200 Gross per 24 hour  Intake 1204.02 ml  Output 900 ml  Net 304.02 ml   Filed Weights   09/02/22 1722 09/02/22 2100 09/03/22 0500  Weight: 72.6 kg 51.7 kg 51.7 kg    Examination: General: Intubated, ill-appearing, HENT: Endotracheal tube in place, moist oral mucosa Lungs: Decreased air entry bilaterally with few rhonchi at the bases S1-S2 appreciated Cardiovascular: S1-S2 appreciated Extremities: left BKA, pitting edema of R LE Neuro: sedated  Resolved Hospital Problem list     Assessment & Plan:   Acute hypoxemic respiratory failure with hypercapnia -Day 3 of Rocephin and azithromycin -Continue Solu-Medrol daily -Continue bronchodilators with DuoNeb -Respiratory viral panel negative -Blood culture negative so far  Chronic hyponatremia -Continue to trend  Acute metabolic encephalopathy Chronic alcohol use -On phenobarbital taper Thiamine and folic acid  Hypertension -Home medications on hold  Nutrition -Continue tube feeds  Attempt at weaning today was not well-tolerated with patient getting agitated and biting on the tube becoming difficult to ventilate Will attempt breathing trials as tolerated Send U toxicology, patient quite agitated, past history of drug use  Best Practice (right click and "Reselect all SmartList Selections" daily)   Diet/type: tubefeeds DVT prophylaxis: LMWH GI prophylaxis: N/A Lines: N/A Foley:  Yes, and it is still needed Code Status:  full code Last date of multidisciplinary goals of care discussion [12/21: see IPAL note]  Labs   CBC: Recent Labs  Lab 09/02/22 1216 09/02/22 1508 09/02/22 1739 09/02/22 2154 09/03/22 0415  WBC 14.7*  --   --   --  10.6*  HGB 11.7* 12.6* 11.6* 10.2* 10.1*  HCT 33.8* 37.0* 34.0* 30.0* 28.9*  MCV 92.3  --   --   --  91.5  PLT 317  --   --   --  278    Basic Metabolic Panel: Recent Labs  Lab 09/02/22 1216 09/02/22 1508 09/02/22 1726 09/02/22 1739 09/02/22 2154 09/03/22 0413  09/03/22 0415 09/03/22 1657 09/03/22 2142 09/04/22 0756  NA 129*   < > 128* 131* 130* 132*  --  130*  --  132*  K 3.5   < > 3.5 3.2* 3.2* 3.2*  --  5.1  --  4.7  CL 86*  --  94*  --   --  94*  --  96*  --  98  CO2 31  --  26  --   --  27  --  26  --  26  GLUCOSE 112*  --  124*  --   --  139*  --  119*  --  106*  BUN 18  --  17  --   --  14  --  14  --  22  CREATININE 1.15  --  0.82  --   --  0.66  --  0.68  --  0.68  CALCIUM 8.1*  --  7.3*  --   --  7.6*  --  7.8*  --  7.9*  MG  --   --   --   --   --   --  1.3* 2.4 2.2 1.9  PHOS  --   --   --   --   --   --  2.5 2.8  --  2.6   < > = values in this interval not displayed.   GFR: Estimated Creatinine Clearance: 67.3 mL/min (by C-G formula based on SCr of 0.68 mg/dL). Recent Labs  Lab 09/02/22 1216 09/02/22 2214 09/03/22 0415 09/04/22 0756  PROCALCITON  --  <0.10 <0.10 <0.10  WBC 14.7*  --  10.6*  --     Liver Function Tests: Recent Labs  Lab 09/02/22 1726  AST 25  ALT 11  ALKPHOS 71  BILITOT 1.2  PROT 6.2*  ALBUMIN 3.2*   No results for input(s): "LIPASE", "AMYLASE" in the last 168 hours. No results for input(s): "AMMONIA" in the last 168 hours.  ABG    Component Value Date/Time   PHART 7.311 (L) 09/02/2022 2154   PCO2ART 55.6 (H) 09/02/2022 2154   PO2ART 84 09/02/2022 2154   HCO3 28.3 (H) 09/02/2022 2154   TCO2 30 09/02/2022 2154   ACIDBASEDEF 1.0 09/02/2022 1739   O2SAT 95 09/02/2022 2154     Coagulation Profile: No results for input(s): "INR", "PROTIME" in the last 168 hours.  Cardiac Enzymes: No results for input(s): "CKTOTAL", "CKMB", "CKMBINDEX", "TROPONINI" in the last 168 hours.  HbA1C: Hgb A1c MFr Bld  Date/Time Value Ref Range Status  09/02/2022 09:03 PM 5.6 4.8 - 5.6 % Final    Comment:    (NOTE)         Prediabetes: 5.7 - 6.4         Diabetes: >6.4         Glycemic control for adults with diabetes: <7.0   11/09/2013 01:00 PM 5.9 (H) <5.7 % Final    Comment:    (NOTE)  According to the ADA Clinical Practice Recommendations for 2011, when HbA1c is used as a screening test:  >=6.5%   Diagnostic of Diabetes Mellitus           (if abnormal result is confirmed) 5.7-6.4%   Increased risk of developing Diabetes Mellitus References:Diagnosis and Classification of Diabetes Mellitus,Diabetes Care,2011,34(Suppl 1):S62-S69 and Standards of Medical Care in         Diabetes - 2011,Diabetes Care,2011,34 (Suppl 1):S11-S61.    CBG: Recent Labs  Lab 09/03/22 1958 09/03/22 2337 09/04/22 0331 09/04/22 0841 09/04/22 1252  GLUCAP 114* 117* 111* 105* 126*    Past Medical History:  He,  has a past medical history of Critical lower limb ischemia (HCC), ETOH abuse, Gout, and Hypertension.   Surgical History:   Past Surgical History:  Procedure Laterality Date   ABDOMINAL AORTAGRAM N/A 12/26/2013   Procedure: ABDOMINAL AORTAGRAM;  Surgeon: [name], MD;  Location: [facility] CATH LAB;  Service: Cardiovascular;  Laterality: N/A;   AMPUTATION Left 12/28/2013   Procedure: LEFT BELOW KNEE AMPUTATION;  Surgeon: Nadara Mustard, MD;  Location: [facility] OR;  Service: Orthopedics;  Laterality: Left;   BELOW KNEE LEG AMPUTATION     Left   LEG SURGERY       Social History:   reports that he has been smoking cigarettes. He has a 20.00 pack-year smoking history. He has never used smokeless tobacco. He reports current alcohol use of about 120.0 standard drinks of alcohol per week. He reports current drug use. Drugs: Cocaine and Marijuana.   Family History:  His family history includes Diabetes Mellitus II in his mother. There is no history of CAD or Stroke.   Allergies No Known Allergies   The patient is critically ill with multiple organ systems failure and requires high complexity decision making for assessment and support, frequent evaluation and titration of therapies, application of advanced monitoring technologies and  extensive interpretation of multiple databases. Critical Care Time devoted to patient care services described in this note independent of APP/resident time (if applicable)  is 32 minutes.   Virl Diamond MD [place] [facility] Personal pager: See Amion If unanswered, please page CCM On-call: #[phone]

## 2022-09-05 ENCOUNTER — Inpatient Hospital Stay (HOSPITAL_COMMUNITY): Payer: Medicare Other

## 2022-09-05 DIAGNOSIS — J9601 Acute respiratory failure with hypoxia: Secondary | ICD-10-CM | POA: Diagnosis not present

## 2022-09-05 DIAGNOSIS — R0602 Shortness of breath: Secondary | ICD-10-CM | POA: Diagnosis not present

## 2022-09-05 DIAGNOSIS — J189 Pneumonia, unspecified organism: Secondary | ICD-10-CM | POA: Diagnosis not present

## 2022-09-05 LAB — BASIC METABOLIC PANEL
Anion gap: 7 (ref 5–15)
BUN: 17 mg/dL (ref 8–23)
CO2: 28 mmol/L (ref 22–32)
Calcium: 8 mg/dL — ABNORMAL LOW (ref 8.9–10.3)
Chloride: 101 mmol/L (ref 98–111)
Creatinine, Ser: 0.6 mg/dL — ABNORMAL LOW (ref 0.61–1.24)
GFR, Estimated: >60 mL/min (ref >60)
Glucose, Bld: 108 mg/dL — ABNORMAL HIGH (ref 70–99)
Potassium: 4.6 mmol/L (ref 3.5–5.1)
Sodium: 136 mmol/L (ref 135–145)

## 2022-09-05 LAB — CBC
HCT: 26 % — ABNORMAL LOW (ref 39.0–52.0)
Hemoglobin: 8.7 g/dL — ABNORMAL LOW (ref 13.0–17.0)
MCH: 32 pg (ref 26.0–34.0)
MCHC: 33.5 g/dL (ref 30.0–36.0)
MCV: 95.6 fL (ref 80.0–100.0)
Platelets: 283 10*3/uL (ref 150–400)
RBC: 2.72 MIL/uL — ABNORMAL LOW (ref 4.22–5.81)
RDW: 13.2 % (ref 11.5–15.5)
WBC: 8.5 10*3/uL (ref 4.0–10.5)
nRBC: 0 % (ref 0.0–0.2)

## 2022-09-05 LAB — PHOSPHORUS
Phosphorus: 2.7 mg/dL (ref 2.5–4.6)
Phosphorus: 3.4 mg/dL (ref 2.5–4.6)

## 2022-09-05 LAB — MAGNESIUM
Magnesium: 1.4 mg/dL — ABNORMAL LOW (ref 1.7–2.4)
Magnesium: 2 mg/dL (ref 1.7–2.4)

## 2022-09-05 LAB — GLUCOSE, CAPILLARY
Glucose-Capillary: 100 mg/dL — ABNORMAL HIGH (ref 70–99)
Glucose-Capillary: 104 mg/dL — ABNORMAL HIGH (ref 70–99)
Glucose-Capillary: 107 mg/dL — ABNORMAL HIGH (ref 70–99)
Glucose-Capillary: 125 mg/dL — ABNORMAL HIGH (ref 70–99)
Glucose-Capillary: 133 mg/dL — ABNORMAL HIGH (ref 70–99)
Glucose-Capillary: 158 mg/dL — ABNORMAL HIGH (ref 70–99)
Glucose-Capillary: 74 mg/dL (ref 70–99)

## 2022-09-05 MED ORDER — MAGNESIUM SULFATE 2 GM/50ML IV SOLN
2.0000 g | Freq: Once | INTRAVENOUS | Status: AC
  Administered 2022-09-05: 2 g via INTRAVENOUS
  Filled 2022-09-05: qty 50

## 2022-09-05 MED ORDER — SENNA 8.6 MG PO TABS
1.0000 | ORAL_TABLET | Freq: Two times a day (BID) | ORAL | Status: DC
  Administered 2022-09-05 – 2022-09-09 (×4): 8.6 mg
  Filled 2022-09-05 (×5): qty 1

## 2022-09-05 NOTE — Progress Notes (Signed)
NAME:  Ronald [name], MRN:  [id number], DOB:  30-Apr-1956, LOS: 3 ADMISSION DATE:  09/02/2022, CONSULTATION DATE:  09/02/2022 REFERRING MD:  Silverio Lay, MD CHIEF COMPLAINT: Respiratory Failure  History of Present Illness:  Ronald [name] is a 66 y.o. Male with a PMH of COPD, ETOH and substance abuse, Left BKA, Smoker, who presented to the ED with hypoxia. Per family, pt was supposed to have a CXR completed today per PCP, but it was closed and when the patient's daughter picked him up, she noticed his WOB was increasing worse than normal. On arrival, he was noted to be disoriented to date and having audible wheezing, SHOB, and cough with Spo2 of 87%.    EDP concerned for alcohol withdrawal seizure but was too somnolent for sedation without requiring intubation. Therefore, pt was started on BiPAP and then intubated d/t worsening respiratory status. ED workup reveled Na 129, Chloride 86, WBC 14.7. VBG 7.269/72.4/35/33.2. CXR with no acute findings.   PCCM consulted for management of respiratory failure and mechanical ventilation.   Pertinent  Medical History  COPD, ETOH and substance abuse, Left BKA, Smoker  Significant Hospital Events: Including procedures, antibiotic start and stop dates in addition to other pertinent events   12/21 Admitted, Failed BiPAP > Intubated, CT Angio Ordered, azithromycin and rocephin started  12/23 failed weaning attempt 12/24 not able to wean   Interim History / Subjective:  Intubated, sedated Attempts to arouse but profoundly weak  Objective   Blood pressure 113/70, pulse 89, temperature 99.5 F (37.5 C), temperature source Axillary, resp. rate (!) 22, height 5\' 4"  (1.626 m), weight 59.1 kg, SpO2 99 %.    Vent Mode: PRVC FiO2 (%):  [40 %] 40 % Set Rate:  [22 bmp] 22 bmp Vt Set:  [470 mL] 470 mL PEEP:  [5 cmH20] 5 cmH20 Plateau Pressure:  [15 cmH20-18 cmH20] 18 cmH20   Intake/Output Summary (Last 24 hours) at 09/05/2022 1210 Last data filed at 09/05/2022  1123 Gross per 24 hour  Intake 1585.38 ml  Output 1355 ml  Net 230.38 ml   Filed Weights   09/03/22 0500 09/04/22 0500 09/05/22 0500  Weight: 51.7 kg 58.2 kg 59.1 kg    Examination: General: Intubated, chronically ill-appearing HENT: Endotracheal tube in place, moist oral mucosa Lungs: Decreased air entry bilaterally with few rhonchi at the bases  Cardiovascular: S1-S2 appreciated Extremities: Left BKA, pitting edema up to the right lower extremity Neuro: sedated  Resolved Hospital Problem list     Assessment & Plan:   Acute hypoxemic respiratory failure with hypercapnia -Day 4 of Rocephin and azithromycin -Discontinue azithromycin today, complete 5 days of Rocephin -Respiratory viral panel negative Blood culture negative so far  Chronic hyponatremia -Continue to trend  Acute metabolic encephalopathy Chronic alcohol abuse -On phenobarbital taper -Thiamine and folic acid  Anemia -Continue to trend -Transfuse per protocol Hypertension -Home medications on hold  Continue tube feeds  Follow U tox Breathing trials as tolerated   Best Practice (right click and "Reselect all SmartList Selections" daily)   Diet/type: tubefeeds DVT prophylaxis: LMWH GI prophylaxis: N/A Lines: N/A Foley:  Yes, and it is still needed Code Status:  full code Last date of multidisciplinary goals of care discussion [12/21: see IPAL note]  Labs   CBC: Recent Labs  Lab 09/02/22 1216 09/02/22 1508 09/02/22 1739 09/02/22 2154 09/03/22 0415 09/05/22 0731  WBC 14.7*  --   --   --  10.6* 8.5  HGB 11.7* 12.6* 11.6* 10.2* 10.1* 8.7*  HCT  33.8* 37.0* 34.0* 30.0* 28.9* 26.0*  MCV 92.3  --   --   --  91.5 95.6  PLT 317  --   --   --  278 283    Basic Metabolic Panel: Recent Labs  Lab 09/03/22 0413 09/03/22 0415 09/03/22 0415 09/03/22 1657 09/03/22 2142 09/04/22 0756 09/04/22 1629 09/05/22 0731  NA 132*  --   --  130*  --  132* 134* 136  K 3.2*  --   --  5.1  --  4.7 5.3*  4.6  CL 94*  --   --  96*  --  98 98 101  CO2 27  --   --  26  --  26 28 28   GLUCOSE 139*  --   --  119*  --  106* 123* 108*  BUN 14  --   --  14  --  22 22 17   CREATININE 0.66  --   --  0.68  --  0.68 0.64 0.60*  CALCIUM 7.6*  --   --  7.8*  --  7.9* 7.9* 8.0*  MG  --  1.3*   < > 2.4 2.2 1.9 1.7 1.4*  PHOS  --  2.5  --  2.8  --  2.6 3.1 2.7   < > = values in this interval not displayed.   GFR: Estimated Creatinine Clearance: 77 mL/min (A) (by C-G formula based on SCr of 0.6 mg/dL (L)). Recent Labs  Lab 09/02/22 1216 09/02/22 2214 09/03/22 0415 09/04/22 0756 09/05/22 0731  PROCALCITON  --  <0.10 <0.10 <0.10  --   WBC 14.7*  --  10.6*  --  8.5    Liver Function Tests: Recent Labs  Lab 09/02/22 1726  AST 25  ALT 11  ALKPHOS 71  BILITOT 1.2  PROT 6.2*  ALBUMIN 3.2*   No results for input(s): "LIPASE", "AMYLASE" in the last 168 hours. No results for input(s): "AMMONIA" in the last 168 hours.  ABG    Component Value Date/Time   PHART 7.311 (L) 09/02/2022 2154   PCO2ART 55.6 (H) 09/02/2022 2154   PO2ART 84 09/02/2022 2154   HCO3 28.3 (H) 09/02/2022 2154   TCO2 30 09/02/2022 2154   ACIDBASEDEF 1.0 09/02/2022 1739   O2SAT 95 09/02/2022 2154     Coagulation Profile: No results for input(s): "INR", "PROTIME" in the last 168 hours.  Cardiac Enzymes: No results for input(s): "CKTOTAL", "CKMB", "CKMBINDEX", "TROPONINI" in the last 168 hours.  HbA1C: Hgb A1c MFr Bld  Date/Time Value Ref Range Status  09/02/2022 09:03 PM 5.6 4.8 - 5.6 % Final    Comment:    (NOTE)         Prediabetes: 5.7 - 6.4         Diabetes: >6.4         Glycemic control for adults with diabetes: <7.0   11/09/2013 01:00 PM 5.9 (H) <5.7 % Final    Comment:    (NOTE)                                                                       According to the ADA Clinical Practice Recommendations for 2011, when HbA1c is used as a screening test:  >=6.5%  Diagnostic of Diabetes Mellitus            (if abnormal result is confirmed) 5.7-6.4%   Increased risk of developing Diabetes Mellitus References:Diagnosis and Classification of Diabetes Mellitus,Diabetes Care,2011,34(Suppl 1):S62-S69 and Standards of Medical Care in         Diabetes - 2011,Diabetes Care,2011,34 (Suppl 1):S11-S61.    CBG: Recent Labs  Lab 09/04/22 1943 09/04/22 2358 09/05/22 0326 09/05/22 0805 09/05/22 1107  GLUCAP 117* 104* 125* 100* 74    Past Medical History:  He,  has a past medical history of Critical lower limb ischemia (HCC), ETOH abuse, Gout, and Hypertension.   Surgical History:   Past Surgical History:  Procedure Laterality Date   ABDOMINAL AORTAGRAM N/A 12/26/2013   Procedure: ABDOMINAL AORTAGRAM;  Surgeon: [name], MD;  Location: [facility] CATH LAB;  Service: Cardiovascular;  Laterality: N/A;   AMPUTATION Left 12/28/2013   Procedure: LEFT BELOW KNEE AMPUTATION;  Surgeon: Nadara Mustard, MD;  Location: [facility] OR;  Service: Orthopedics;  Laterality: Left;   BELOW KNEE LEG AMPUTATION     Left   LEG SURGERY       Social History:   reports that he has been smoking cigarettes. He has a 20.00 pack-year smoking history. He has never used smokeless tobacco. He reports current alcohol use of about 120.0 standard drinks of alcohol per week. He reports current drug use. Drugs: Cocaine and Marijuana.   Family History:  His family history includes Diabetes Mellitus II in his mother. There is no history of CAD or Stroke.   Allergies No Known Allergies   The patient is critically ill with multiple organ systems failure and requires high complexity decision making for assessment and support, frequent evaluation and titration of therapies, application of advanced monitoring technologies and extensive interpretation of multiple databases. Critical Care Time devoted to patient care services described in this note independent of APP/resident time (if applicable)  is 30 minutes.   Virl Diamond MD [place]  Pulmonary Critical Care Personal pager: See Amion If unanswered, please page CCM On-call: [phone]

## 2022-09-06 DIAGNOSIS — R0602 Shortness of breath: Secondary | ICD-10-CM | POA: Diagnosis not present

## 2022-09-06 DIAGNOSIS — J9601 Acute respiratory failure with hypoxia: Secondary | ICD-10-CM | POA: Diagnosis not present

## 2022-09-06 DIAGNOSIS — J189 Pneumonia, unspecified organism: Secondary | ICD-10-CM | POA: Diagnosis not present

## 2022-09-06 LAB — BASIC METABOLIC PANEL
Anion gap: 9 (ref 5–15)
BUN: 14 mg/dL (ref 8–23)
CO2: 28 mmol/L (ref 22–32)
Calcium: 8 mg/dL — ABNORMAL LOW (ref 8.9–10.3)
Chloride: 102 mmol/L (ref 98–111)
Creatinine, Ser: 0.59 mg/dL — ABNORMAL LOW (ref 0.61–1.24)
GFR, Estimated: >60 mL/min (ref >60)
Glucose, Bld: 121 mg/dL — ABNORMAL HIGH (ref 70–99)
Potassium: 4.6 mmol/L (ref 3.5–5.1)
Sodium: 139 mmol/L (ref 135–145)

## 2022-09-06 LAB — GLUCOSE, CAPILLARY
Glucose-Capillary: 109 mg/dL — ABNORMAL HIGH (ref 70–99)
Glucose-Capillary: 120 mg/dL — ABNORMAL HIGH (ref 70–99)
Glucose-Capillary: 79 mg/dL (ref 70–99)
Glucose-Capillary: 87 mg/dL (ref 70–99)
Glucose-Capillary: 91 mg/dL (ref 70–99)

## 2022-09-06 LAB — CBC
HCT: 27.8 % — ABNORMAL LOW (ref 39.0–52.0)
Hemoglobin: 9.4 g/dL — ABNORMAL LOW (ref 13.0–17.0)
MCH: 32 pg (ref 26.0–34.0)
MCHC: 33.8 g/dL (ref 30.0–36.0)
MCV: 94.6 fL (ref 80.0–100.0)
Platelets: 326 10*3/uL (ref 150–400)
RBC: 2.94 MIL/uL — ABNORMAL LOW (ref 4.22–5.81)
RDW: 13.3 % (ref 11.5–15.5)
WBC: 10.4 10*3/uL (ref 4.0–10.5)
nRBC: 0 % (ref 0.0–0.2)

## 2022-09-06 LAB — MAGNESIUM: Magnesium: 1.7 mg/dL (ref 1.7–2.4)

## 2022-09-06 LAB — TRIGLYCERIDES: Triglycerides: 47 mg/dL (ref <150)

## 2022-09-06 LAB — PHOSPHORUS: Phosphorus: 3.1 mg/dL (ref 2.5–4.6)

## 2022-09-06 MED ORDER — ADULT MULTIVITAMIN W/MINERALS CH: 1.0000 | ORAL_TABLET | Freq: Every day | ORAL | Status: DC

## 2022-09-06 MED ORDER — PHENOBARBITAL SODIUM 65 MG/ML IJ SOLN: 32.5000 mg | Freq: Two times a day (BID) | INTRAMUSCULAR | Status: DC

## 2022-09-06 MED ORDER — ORAL CARE MOUTH RINSE
15.0000 mL | Freq: Four times a day (QID) | OROMUCOSAL | Status: DC | PRN
  Administered 2022-09-08: 15 mL via OROMUCOSAL

## 2022-09-06 MED ORDER — LORAZEPAM 2 MG/ML IJ SOLN
1.0000 mg | INTRAMUSCULAR | Status: DC | PRN
  Administered 2022-09-06 – 2022-09-07 (×5): 2 mg via INTRAVENOUS
  Filled 2022-09-06 (×5): qty 1

## 2022-09-06 MED ORDER — PHENOBARBITAL SODIUM 65 MG/ML IJ SOLN
32.5000 mg | Freq: Three times a day (TID) | INTRAMUSCULAR | Status: DC
  Administered 2022-09-06 – 2022-09-08 (×5): 32.5 mg via INTRAVENOUS
  Filled 2022-09-06 (×5): qty 1

## 2022-09-06 NOTE — Procedures (Signed)
Extubation Procedure Note  Patient Details:   Name: [name] DOB: 01-15-1956 MRN: [id number]   Airway Documentation:  Airway 7.5 mm (Active)  Secured at (cm) 26 cm 09/06/22 0215  Measured From Lips 09/06/22 0215  Secured Location Center 09/06/22 0215  Secured By [employer] 09/06/22 0215  Tube Holder Repositioned Yes 09/06/22 0215  Prone position No 09/06/22 0215  Cuff Pressure (cm H2O) Clear OR 27-39 CmH2O 09/06/22 0215  Site Condition Dry 09/06/22 0215   Vent end date: (not recorded) Vent end time: (not recorded)   Evaluation  O2 sats: stable throughout Complications: No apparent complications Patient did tolerate procedure well. Bilateral Breath Sounds: Clear, Diminished   Yes  Cali Cuartas 09/06/2022, 10:37 AM  Pt placed on 4LPM [place] after extubation.  Pt had positive cuff leak before extubaion.

## 2022-09-06 NOTE — Progress Notes (Signed)
eLink Physician-Brief Progress Note Patient Name: Ronald [name] DOB: 12-16-1955 MRN: [id number]   Date of Service  09/06/2022  HPI/Events of Note  Patient extubated today, OG was removed, in for swallow eval tomorrow. Has Phenobarb taper ordered which is due at 10 pm. Unable to take pills.   eICU Interventions  Continue PRN ativan Unable to change order for phenobarb - d/w pharmacist and they will order the tapering doses as planned for Phenobarb Does not need twice daily pepcid anymore  Also discontinued the sedative infusions since extubated now D/w RN      Intervention Category Major Interventions: Delirium, psychosis, severe agitation - evaluation and management  Coye Dawood G Bennet Kujawa 09/06/2022, 9:47 PM  Addendum at 3 am : Hypertension. Has been Hypertensive all day. Not agitated either. No known history of HTN but Bps persistently high. Will add PRN hydralazine

## 2022-09-06 NOTE — Progress Notes (Signed)
NAME:  Ronald [name], MRN:  [id number], DOB:  [birth year], LOS: 4 ADMISSION DATE:  09/02/2022, CONSULTATION DATE:  09/02/2022 REFERRING MD:  Silverio Lay, MD CHIEF COMPLAINT: Respiratory Failure  History of Present Illness:  Ronald [name] is a 66 y.o. Male with a PMH of COPD, ETOH and substance abuse, Left BKA, Smoker, who presented to the ED with hypoxia. Per family, pt was supposed to have a CXR completed today per PCP, but it was closed and when the patient's daughter picked him up, she noticed his WOB was increasing worse than normal. On arrival, he was noted to be disoriented to date and having audible wheezing, SHOB, and cough with Spo2 of 87%.    EDP concerned for alcohol withdrawal seizure but was too somnolent for sedation without requiring intubation. Therefore, pt was started on BiPAP and then intubated d/t worsening respiratory status. ED workup reveled Na 129, Chloride 86, WBC 14.7. VBG 7.269/72.4/35/33.2. CXR with no acute findings.   PCCM consulted for management of respiratory failure and mechanical ventilation.   Pertinent  Medical History  COPD, ETOH and substance abuse, Left BKA, Smoker  Significant Hospital Events: Including procedures, antibiotic start and stop dates in addition to other pertinent events   12/21 Admitted, Failed BiPAP > Intubated, CT Angio Ordered, azithromycin and rocephin started  12/23 failed weaning attempt 12/24 not able to wean 12/24 weaning today 12/25-extubated this morning   Interim History / Subjective:  Was weaning well this morning, extubated -Very frail  Objective   Blood pressure 115/74, pulse 93, temperature 99.2 F (37.3 C), temperature source Axillary, resp. rate (!) 22, height 5\' 4"  (1.626 m), weight 61.8 kg, SpO2 98 %.    Vent Mode: PRVC FiO2 (%):  [40 %] 40 % Set Rate:  [22 bmp] 22 bmp Vt Set:  [470 mL] 470 mL PEEP:  [5 cmH20] 5 cmH20 Plateau Pressure:  [15 cmH20-18 cmH20] 16 cmH20   Intake/Output Summary (Last 24 hours) at  09/06/2022 09/08/2022 Last data filed at 09/06/2022 0600 Gross per 24 hour  Intake 1801.72 ml  Output 1330 ml  Net 471.72 ml   Filed Weights   09/04/22 0500 09/05/22 0500 09/06/22 0500  Weight: 58.2 kg 59.1 kg 61.8 kg    Examination: General: Very frail HENT: On nasal cannula Lungs: Decreased air entry with some rhonchi Cardiovascular: S1-S2 appreciated d Extremities: Left BKA, pitting edema right lower extremity Neuro:  arousable and following commands  Resolved Hospital Problem list     Assessment & Plan:   Acute hypoxemic respiratory failure with hypercapnia -Day 5 of Rocephin, azithromycin completed -Negative respiratory viral panel Blood culture negative  Chronic hyponatremia -Will continue to trend  Acute metabolic encephalopathy Chronic alcohol abuse -Phenobarbital taper Thiamine and folic acid  Anemia -Continue to trend  Hypertension -Home meds on hold  It went well this morning and decision to extubate was made  Still with significant risk of respiratory compromise being so frail  Best Practice (right click and "Reselect all SmartList Selections" daily)   Diet/type: tubefeeds DVT prophylaxis: LMWH GI prophylaxis: N/A Lines: N/A Foley:  Yes, and it is still needed Code Status:  full code Last date of multidisciplinary goals of care discussion [12/21: see IPAL note] Discussed with patient's sister at bedside  Labs   CBC: Recent Labs  Lab 09/02/22 1216 09/02/22 1508 09/02/22 1739 09/02/22 2154 09/03/22 0415 09/05/22 0731 09/06/22 0632  WBC 14.7*  --   --   --  10.6* 8.5 10.4  HGB 11.7*   < >  11.6* 10.2* 10.1* 8.7* 9.4*  HCT 33.8*   < > 34.0* 30.0* 28.9* 26.0* 27.8*  MCV 92.3  --   --   --  91.5 95.6 94.6  PLT 317  --   --   --  278 283 326   < > = values in this interval not displayed.    Basic Metabolic Panel: Recent Labs  Lab 09/03/22 1657 09/03/22 2142 09/04/22 0756 09/04/22 1629 09/05/22 0731 09/05/22 1635 09/06/22 0632  NA  130*  --  132* 134* 136  --  139  K 5.1  --  4.7 5.3* 4.6  --  4.6  CL 96*  --  98 98 101  --  102  CO2 26  --  26 28 28   --  28  GLUCOSE 119*  --  106* 123* 108*  --  121*  BUN 14  --  22 22 17   --  14  CREATININE 0.68  --  0.68 0.64 0.60*  --  0.59*  CALCIUM 7.8*  --  7.9* 7.9* 8.0*  --  8.0*  MG 2.4   < > 1.9 1.7 1.4* 2.0 1.7  PHOS 2.8  --  2.6 3.1 2.7 3.4 3.1   < > = values in this interval not displayed.   GFR: Estimated Creatinine Clearance: 77.1 mL/min (A) (by C-G formula based on SCr of 0.59 mg/dL (L)). Recent Labs  Lab 09/02/22 1216 09/02/22 2214 09/03/22 0415 09/04/22 0756 09/05/22 0731 09/06/22 0632  PROCALCITON  --  <0.10 <0.10 <0.10  --   --   WBC 14.7*  --  10.6*  --  8.5 10.4    Liver Function Tests: Recent Labs  Lab 09/02/22 1726  AST 25  ALT 11  ALKPHOS 71  BILITOT 1.2  PROT 6.2*  ALBUMIN 3.2*   No results for input(s): "LIPASE", "AMYLASE" in the last 168 hours. No results for input(s): "AMMONIA" in the last 168 hours.  ABG    Component Value Date/Time   PHART 7.311 (L) 09/02/2022 2154   PCO2ART 55.6 (H) 09/02/2022 2154   PO2ART 84 09/02/2022 2154   HCO3 28.3 (H) 09/02/2022 2154   TCO2 30 09/02/2022 2154   ACIDBASEDEF 1.0 09/02/2022 1739   O2SAT 95 09/02/2022 2154     Coagulation Profile: No results for input(s): "INR", "PROTIME" in the last 168 hours.  Cardiac Enzymes: No results for input(s): "CKTOTAL", "CKMB", "CKMBINDEX", "TROPONINI" in the last 168 hours.  HbA1C: Hgb A1c MFr Bld  Date/Time Value Ref Range Status  09/02/2022 09:03 PM 5.6 4.8 - 5.6 % Final    Comment:    (NOTE)         Prediabetes: 5.7 - 6.4         Diabetes: >6.4         Glycemic control for adults with diabetes: <7.0   11/09/2013 01:00 PM 5.9 (H) <5.7 % Final    Comment:    (NOTE)                                                                       According to the ADA Clinical Practice Recommendations for 2011, when HbA1c is used as a screening test:   >=6.5%   Diagnostic of  Diabetes Mellitus           (if abnormal result is confirmed) 5.7-6.4%   Increased risk of developing Diabetes Mellitus References:Diagnosis and Classification of Diabetes Mellitus,Diabetes Care,2011,34(Suppl 1):S62-S69 and Standards of Medical Care in         Diabetes - 2011,Diabetes Care,2011,34 (Suppl 1):S11-S61.    CBG: Recent Labs  Lab 09/05/22 1551 09/05/22 1947 09/05/22 2350 09/06/22 0342 09/06/22 0805  GLUCAP 158* 133* 107* 120* 109*    Past Medical History:  He,  has a past medical history of Critical lower limb ischemia (HCC), ETOH abuse, Gout, and Hypertension.   Surgical History:   Past Surgical History:  Procedure Laterality Date   ABDOMINAL AORTAGRAM N/A 12/26/2013   Procedure: ABDOMINAL AORTAGRAM;  Surgeon: [name], MD;  Location: [facility] CATH LAB;  Service: Cardiovascular;  Laterality: N/A;   AMPUTATION Left 12/28/2013   Procedure: LEFT BELOW KNEE AMPUTATION;  Surgeon: Nadara Mustard, MD;  Location: [facility] OR;  Service: Orthopedics;  Laterality: Left;   BELOW KNEE LEG AMPUTATION     Left   LEG SURGERY       Social History:   reports that he has been smoking cigarettes. He has a 20.00 pack-year smoking history. He has never used smokeless tobacco. He reports current alcohol use of about 120.0 standard drinks of alcohol per week. He reports current drug use. Drugs: Cocaine and Marijuana.   Family History:  His family history includes Diabetes Mellitus II in his mother. There is no history of CAD or Stroke.   Allergies No Known Allergies    The patient is critically ill with multiple organ systems failure and requires high complexity decision making for assessment and support, frequent evaluation and titration of therapies, application of advanced monitoring technologies and extensive interpretation of multiple databases. Critical Care Time devoted to patient care services described in this note independent of APP/resident time (if  applicable)  is 34 minutes.   Virl Diamond MD [place] [facility] Critical [facility] Personal pager: See Amion If unanswered, please page CCM On-call: [phone]

## 2022-09-07 ENCOUNTER — Inpatient Hospital Stay (HOSPITAL_COMMUNITY): Payer: Medicare Other

## 2022-09-07 DIAGNOSIS — J449 Chronic obstructive pulmonary disease, unspecified: Secondary | ICD-10-CM | POA: Diagnosis not present

## 2022-09-07 DIAGNOSIS — G9341 Metabolic encephalopathy: Secondary | ICD-10-CM | POA: Diagnosis not present

## 2022-09-07 DIAGNOSIS — E43 Unspecified severe protein-calorie malnutrition: Secondary | ICD-10-CM | POA: Diagnosis not present

## 2022-09-07 DIAGNOSIS — R0602 Shortness of breath: Secondary | ICD-10-CM | POA: Diagnosis not present

## 2022-09-07 DIAGNOSIS — R4182 Altered mental status, unspecified: Secondary | ICD-10-CM

## 2022-09-07 DIAGNOSIS — J9622 Acute and chronic respiratory failure with hypercapnia: Secondary | ICD-10-CM | POA: Diagnosis not present

## 2022-09-07 DIAGNOSIS — J189 Pneumonia, unspecified organism: Secondary | ICD-10-CM | POA: Diagnosis not present

## 2022-09-07 LAB — GLUCOSE, CAPILLARY
Glucose-Capillary: 65 mg/dL — ABNORMAL LOW (ref 70–99)
Glucose-Capillary: 83 mg/dL (ref 70–99)
Glucose-Capillary: 78 mg/dL (ref 70–99)
Glucose-Capillary: 95 mg/dL (ref 70–99)
Glucose-Capillary: 96 mg/dL (ref 70–99)
Glucose-Capillary: 77 mg/dL (ref 70–99)
Glucose-Capillary: 76 mg/dL (ref 70–99)

## 2022-09-07 LAB — POCT I-STAT 7, (LYTES, BLD GAS, ICA,H+H)
Acid-Base Excess: 5 mmol/L — ABNORMAL HIGH (ref 0.0–2.0)
Bicarbonate: 28.8 mmol/L — ABNORMAL HIGH (ref 20.0–28.0)
Calcium, Ion: 1.11 mmol/L — ABNORMAL LOW (ref 1.15–1.40)
HCT: 30 % — ABNORMAL LOW (ref 39.0–52.0)
Hemoglobin: 10.2 g/dL — ABNORMAL LOW (ref 13.0–17.0)
O2 Saturation: 88 %
Patient temperature: 98.6
Potassium: 4.3 mmol/L (ref 3.5–5.1)
Sodium: 132 mmol/L — ABNORMAL LOW (ref 135–145)
TCO2: 30 mmol/L (ref 22–32)
pCO2 arterial: 39.5 mmHg (ref 32–48)
pH, Arterial: 7.47 — ABNORMAL HIGH (ref 7.35–7.45)
pO2, Arterial: 51 mmHg — ABNORMAL LOW (ref 83–108)

## 2022-09-07 LAB — COMPREHENSIVE METABOLIC PANEL
ALT: 49 U/L — ABNORMAL HIGH (ref 0–44)
AST: 62 U/L — ABNORMAL HIGH (ref 15–41)
Albumin: 2.6 g/dL — ABNORMAL LOW (ref 3.5–5.0)
Alkaline Phosphatase: 60 U/L (ref 38–126)
Anion gap: 11 (ref 5–15)
BUN: 10 mg/dL (ref 8–23)
CO2: 27 mmol/L (ref 22–32)
Calcium: 8.6 mg/dL — ABNORMAL LOW (ref 8.9–10.3)
Chloride: 99 mmol/L (ref 98–111)
Creatinine, Ser: 0.43 mg/dL — ABNORMAL LOW (ref 0.61–1.24)
GFR, Estimated: >60 mL/min (ref >60)
Glucose, Bld: 75 mg/dL (ref 70–99)
Potassium: 4.9 mmol/L (ref 3.5–5.1)
Sodium: 137 mmol/L (ref 135–145)
Total Bilirubin: 0.3 mg/dL (ref 0.3–1.2)
Total Protein: 6.4 g/dL — ABNORMAL LOW (ref 6.5–8.1)

## 2022-09-07 LAB — CBC WITH DIFFERENTIAL/PLATELET
Abs Immature Granulocytes: 0.03 10*3/uL (ref 0.00–0.07)
Basophils Absolute: 0 10*3/uL (ref 0.0–0.1)
Basophils Relative: 0 %
Eosinophils Absolute: 0.1 10*3/uL (ref 0.0–0.5)
Eosinophils Relative: 1 %
HCT: 33.3 % — ABNORMAL LOW (ref 39.0–52.0)
Hemoglobin: 10.8 g/dL — ABNORMAL LOW (ref 13.0–17.0)
Immature Granulocytes: 0 %
Lymphocytes Relative: 20 %
Lymphs Abs: 1.6 10*3/uL (ref 0.7–4.0)
MCH: 31.7 pg (ref 26.0–34.0)
MCHC: 32.4 g/dL (ref 30.0–36.0)
MCV: 97.7 fL (ref 80.0–100.0)
Monocytes Absolute: 0.8 10*3/uL (ref 0.1–1.0)
Monocytes Relative: 11 %
Neutro Abs: 5.4 10*3/uL (ref 1.7–7.7)
Neutrophils Relative %: 68 %
Platelets: 369 10*3/uL (ref 150–400)
RBC: 3.41 MIL/uL — ABNORMAL LOW (ref 4.22–5.81)
RDW: 13.2 % (ref 11.5–15.5)
WBC: 7.9 10*3/uL (ref 4.0–10.5)
nRBC: 0 % (ref 0.0–0.2)

## 2022-09-07 LAB — MAGNESIUM: Magnesium: 1.6 mg/dL — ABNORMAL LOW (ref 1.7–2.4)

## 2022-09-07 LAB — CULTURE, BLOOD (ROUTINE X 2)
Culture: NO GROWTH
Culture: NO GROWTH
Special Requests: ADEQUATE

## 2022-09-07 LAB — PHOSPHORUS: Phosphorus: 3.6 mg/dL (ref 2.5–4.6)

## 2022-09-07 MED ORDER — REVEFENACIN 175 MCG/3ML IN SOLN
175.0000 ug | Freq: Every day | RESPIRATORY_TRACT | Status: DC
  Administered 2022-09-08 – 2022-09-17 (×7): 175 ug via RESPIRATORY_TRACT
  Filled 2022-09-07 (×8): qty 3

## 2022-09-07 MED ORDER — DEXTROSE 50 % IV SOLN
INTRAVENOUS | Status: AC
  Administered 2022-09-07: 0.5
  Filled 2022-09-07: qty 50

## 2022-09-07 MED ORDER — HYDRALAZINE HCL 20 MG/ML IJ SOLN
10.0000 mg | Freq: Four times a day (QID) | INTRAMUSCULAR | Status: DC | PRN
  Administered 2022-09-07: 10 mg via INTRAVENOUS
  Filled 2022-09-07: qty 1

## 2022-09-07 MED ORDER — ARFORMOTEROL TARTRATE 15 MCG/2ML IN NEBU
15.0000 ug | INHALATION_SOLUTION | Freq: Two times a day (BID) | RESPIRATORY_TRACT | Status: DC
  Administered 2022-09-08 – 2022-09-17 (×19): 15 ug via RESPIRATORY_TRACT
  Filled 2022-09-07 (×21): qty 2

## 2022-09-07 MED ORDER — BISACODYL 10 MG RE SUPP
10.0000 mg | Freq: Once | RECTAL | Status: AC
  Administered 2022-09-07: 10 mg via RECTAL
  Filled 2022-09-07: qty 1

## 2022-09-07 MED ORDER — MAGNESIUM SULFATE 4 GM/100ML IV SOLN
4.0000 g | Freq: Once | INTRAVENOUS | Status: AC
  Administered 2022-09-07: 4 g via INTRAVENOUS
  Filled 2022-09-07: qty 100

## 2022-09-07 MED ORDER — MAGNESIUM SULFATE 2 GM/50ML IV SOLN
2.0000 g | Freq: Once | INTRAVENOUS | Status: AC
  Administered 2022-09-07: 2 g via INTRAVENOUS
  Filled 2022-09-07: qty 50

## 2022-09-07 NOTE — Progress Notes (Signed)
EEG complete - results pending 

## 2022-09-07 NOTE — Evaluation (Signed)
Clinical/Bedside Swallow Evaluation Patient Details  Name: [name] MRN: [id number] Date of Birth: 22-Jan-1956  Today's Date: 09/07/2022 Time: SLP Start Time (ACUTE ONLY): 0945 SLP Stop Time (ACUTE ONLY): 1000 SLP Time Calculation (min) (ACUTE ONLY): 15 min  Past Medical History:  Past Medical History:  Diagnosis Date   Critical lower limb ischemia (HCC)    ETOH abuse    Gout    Hypertension    Past Surgical History:  Past Surgical History:  Procedure Laterality Date   ABDOMINAL AORTAGRAM N/A 12/26/2013   Procedure: ABDOMINAL Ronny Flurry;  Surgeon: [name], MD;  Location: [facility] CATH LAB;  Service: Cardiovascular;  Laterality: N/A;   AMPUTATION Left 12/28/2013   Procedure: LEFT BELOW KNEE AMPUTATION;  Surgeon: Nadara Mustard, MD;  Location: [facility] OR;  Service: Orthopedics;  Laterality: Left;   BELOW KNEE LEG AMPUTATION     Left   LEG SURGERY     HPI:  Patient is aa 66 y.o. male with PMH: COPD, daily smoker, alcohol abuse, left BKA who presented to the hospital on 09/02/22 with dyspnea, increased WOB and wheezing. In ER, his SpO2 was in 80%'s  and he was started on BiPAP but ultimately intubated due to increased respiratory distress. Chest radiograph did not show opacities or infiltrates, VBG showed pH of 7/269 and pCO2 of 72. Patient extubated on 09/06/22.    Assessment / Plan / Recommendation  Clinical Impression  Patient presents with clinical s/s of dysphagia which is likely a combination of post-extubation dysphagia and lethargy. Patient was able to follow a few basic commands for oral motor assessment but exhibited only low intensity vocalizations and minimal attempts to verbally communicate. He required frequent cues to maintain adequate level of arousal and attention. He exhibited one productive cough but otherwise, his coughs were congested but non-productive. He accepted total of two small ice chips which he did manipulate with tongue and exhibit some very light  mastication. One instance of delayed swallow but otherwise, patient held boluses (water) in oral cavity without actively swallowing. SLP recomnending to continue NPO status and will follow patient for PO readiness. SLP Visit Diagnosis: Dysphagia, unspecified (R13.10)    Aspiration Risk  Moderate aspiration risk;Risk for inadequate nutrition/hydration;Severe aspiration risk    Diet Recommendation NPO   Medication Administration: Via alternative means    Other  Recommendations Oral Care Recommendations: Oral care QID;Staff/trained caregiver to provide oral care    Recommendations for follow up therapy are one component of a multi-disciplinary discharge planning process, led by the attending physician.  Recommendations may be updated based on patient status, additional functional criteria and insurance authorization.  Follow up Recommendations Follow physician's recommendations for discharge plan and follow up therapies      Assistance Recommended at Discharge    Functional Status Assessment Patient has had a recent decline in their functional status and demonstrates the ability to make significant improvements in function in a reasonable and predictable amount of time.  Frequency and Duration min 2x/week  2 weeks       Prognosis Prognosis for Safe Diet Advancement: Good      Swallow Study   General Date of Onset: 09/02/22 HPI: Patient is aa 67 y.o. male with PMH: COPD, daily smoker, alcohol abuse, left BKA who presented to the hospital on 09/02/22 with dyspnea, increased WOB and wheezing. In ER, his SpO2 was in 80%'s  and he was started on BiPAP but ultimately intubated due to increased respiratory distress. Chest radiograph did not show opacities  or infiltrates, VBG showed pH of 7/269 and pCO2 of 72. Patient extubated on 09/06/22. Type of Study: Bedside Swallow Evaluation Previous Swallow Assessment: none found Diet Prior to this Study: NPO Temperature Spikes Noted: No Respiratory  Status: Nasal cannula History of Recent Intubation: Yes Length of Intubations (days): 5 days Date extubated: 09/06/22 Behavior/Cognition: Cooperative;Lethargic/Drowsy;Requires cueing Oral Care Completed by SLP: Yes Oral Cavity - Dentition: Adequate natural dentition Self-Feeding Abilities: Total assist Patient Positioning: Upright in bed Baseline Vocal Quality: Low vocal intensity Volitional Cough: Congested Volitional Swallow: Unable to elicit    Oral/Motor/Sensory Function Overall Oral Motor/Sensory Function: Other (comment) (Patient performed oral motor movements on command to open mouth and stick out tongue but unable to demonstrate full range of oral motor movements)   Ice Chips Ice chips: Impaired Oral Phase Impairments: Impaired mastication;Reduced lingual movement/coordination Oral Phase Functional Implications: Prolonged oral transit;Oral holding Pharyngeal Phase Impairments: Suspected delayed Swallow   Thin Liquid Thin Liquid: Not tested    Nectar Thick     Honey Thick     Puree Puree: Not tested   Solid     Solid: Not tested      Angela Nevin, MA, CCC-SLP Speech Therapy

## 2022-09-07 NOTE — Procedures (Signed)
Patient Name: [name]  MRN: [id number]  Epilepsy Attending: Charlsie Quest  Referring Physician/Provider: Lorin Glass, MD  Date: 09/07/2022 Duration: 21.44 mins  Patient history: 66 year old male with seizure-like activity.  EEG to evaluate for seizure.  Level of alertness: lethargic   AEDs during EEG study: Phenobarb  Technical aspects: This EEG study was done with scalp electrodes positioned according to the 10-20 International system of electrode placement. Electrical activity was reviewed with band pass filter of 1-70Hz , sensitivity of 7 uV/mm, display speed of 76mm/sec with a 60Hz  notched filter applied as appropriate. EEG data were recorded continuously and digitally stored.  Video monitoring was available and reviewed as appropriate.  Description: EEG showed continuous generalized  3 to 6 Hz theta-delta slowing admixed with an excessive amount of 15 to 18 Hz beta activity distributed symmetrically and diffusely.  Hyperventilation and photic stimulation were not performed.     ABNORMALITY - Continuous slow, generalized - Excessive beta, generalized  IMPRESSION: This study is suggestive of moderate to severe diffuse encephalopathy, nonspecific etiology. The excessive beta activity seen in the background is most likely due to the effect of medications like phenobarbital and is a benign EEG pattern. No seizures or epileptiform discharges were seen throughout the recording.  Franceska Strahm 

## 2022-09-07 NOTE — Progress Notes (Signed)
   NAME:  [name], MRN:  [id number], DOB:  1955-10-06, LOS: 5 ADMISSION DATE:  09/02/2022, CONSULTATION DATE:  09/02/2022 REFERRING MD:  Silverio Lay, MD CHIEF COMPLAINT: Respiratory Failure  History of Present Illness:  Ronald [name] is a 66 y.o. Male with a PMH of COPD, ETOH and substance abuse, Left BKA, Smoker, who presented to the ED with hypoxia. Per family, pt was supposed to have a CXR completed today per PCP, but it was closed and when the patient's daughter picked him up, she noticed his WOB was increasing worse than normal. On arrival, he was noted to be disoriented to date and having audible wheezing, SHOB, and cough with Spo2 of 87%.    EDP concerned for alcohol withdrawal seizure but was too somnolent for sedation without requiring intubation. Therefore, pt was started on BiPAP and then intubated d/t worsening respiratory status. ED workup reveled Na 129, Chloride 86, WBC 14.7. VBG 7.269/72.4/35/33.2. CXR with no acute findings.   PCCM consulted for management of respiratory failure and mechanical ventilation.   Pertinent  Medical History  COPD, ETOH and substance abuse, Left BKA, Smoker  Significant Hospital Events: Including procedures, antibiotic start and stop dates in addition to other pertinent events   12/21 Admitted, Failed BiPAP > Intubated, CT Angio Ordered, azithromycin and rocephin started  12/23 failed weaning attempt 12/24 not able to wean 12/24 weaning today 12/25-extubated this morning  Interim History / Subjective:  Off vent. Gurgling respirations Occasionally follows commands Received a lot of ativan overnight  Objective   Blood pressure 138/80, pulse (!) 104, temperature 98.6 F (37 C), temperature source Axillary, resp. rate (!) 27, height 5\' 4"  (1.626 m), weight 57.4 kg, SpO2 96 %.        Intake/Output Summary (Last 24 hours) at 09/07/2022 1041 Last data filed at 09/07/2022 1000 Gross per 24 hour  Intake 330.81 ml  Output 2910 ml  Net -2579.19  ml    Filed Weights   09/05/22 0500 09/06/22 0500 09/07/22 0500  Weight: 59.1 kg 61.8 kg 57.4 kg    Examination: Ill appearing frail man in NAD Mild anasarca Twitching of eyelids L>R Pupils equal Occasional mumbles Not consistently following commands Gurgling transmitted upper airway sounds L BKA noted  Mag repleted CBC stable  Resolved Hospital Problem list     Assessment & Plan:  Acute on chronic hypoxemic and hypercapneic respiratory failure due to AE-COPD and ?DTs EtOH dependence Severe protein calorie malnutrition POA Hx PVD  Very questionable airway protection at present, also concerned he is seizing  - Stat EEG - Low threshold for intubation - Continue phenobarb - Ativan will need to be cautious with, let's see what EEG shows - Steroids, nebs  If ends up back on vent needs GOC discussion  Best Practice (right click and "Reselect all SmartList Selections" daily)   Diet/type: tubefeeds DVT prophylaxis: LMWH GI prophylaxis: N/A Lines: N/A Foley:  Yes, and it is still needed Code Status:  full code Last date of multidisciplinary goals of care discussion [12/21: see IPAL note] Discussed with patient's sister at bedside  33 min cc time 02-21-1983 MD PCCM

## 2022-09-07 NOTE — Progress Notes (Signed)
Nutrition Follow-up  DOCUMENTATION CODES:  Severe malnutrition in context of chronic illness  INTERVENTION:  If unable to advance diet, recommend replacement of feeding tube and resuming enteral feeds.  Goal: Osmolite 1.5 @ 50 ml/hr (1200 ml/day) Resume at 53mL/h and advance by 62mL q6h to goal of 50 PROSource TF20 60 ml daily Tube feeding regimen at goal rate provides 1880 kcal, 95 grams of protein, and 914 ml of H2O.  Continue vitamin regimen  NUTRITION DIAGNOSIS:  Severe Malnutrition related to chronic illness (COPD, ETOH abuse) as evidenced by severe fat depletion, severe muscle depletion, percent weight loss (28.8% weight loss in 7 months). - remains applicable  GOAL:  Patient will meet greater than or equal to 90% of their needs - not progressing, NPO, no enteral access  MONITOR:  Diet advancement, Labs  REASON FOR ASSESSMENT:  Ventilator, Consult Enteral/tube feeding initiation and management  ASSESSMENT:  66 year old male who presented to the ED on 12/21 with AMS, SOB. Pt required intubation in the ED. PMH COPD, ETOH abuse, L BKA. Pt admitted with acute respiratory failure with hypercarbia, PNA, hyponatremia, acute metabolic encephalopathy.  12/21 - Admitted to ICU, intubated 12/22 - TF initiated 12/25 - extubated  Pt resting in bed at the time of assessment with pillow over his head, no family at bedside. Pt able to be extubated yesterday but did not pass bedside swallow. Discussed with RN, SLP to be coming to evaluate again this afternoon. Pt lethargic from medication administration. Concern that pt is seizing and will be reintubated. EEG to start this AM.   Discussed TF, orders are in place and remain appropriate. If reintubated, OGT will be placed and TF can be resumed. Otherwise, if SLP evaluation is not passed cortrak can be placed tomorrow.      Intake/Output Summary (Last 24 hours) at 09/07/2022 1131 Last data filed at 09/07/2022 1100 Gross per 24 hour   Intake 572.45 ml  Output 2910 ml  Net -2337.55 ml  Net IO Since Admission: -1,113.27 mL [09/07/22 1131]  Nutritionally Relevant Medications: Scheduled Meds:  bisacodyl  10 mg Rectal Once   docusate  100 mg Per Tube BID   famotidine  20 mg Per Tube BID   feeding supplement (PROSource TF20)  60 mL Per Tube Daily   folic acid  1 mg Intravenous Daily   insulin aspart  0-9 Units Subcutaneous Q4H   methylPREDNISolone (SOLU-MEDROL) injection  40 mg Intravenous Daily   multivitamin with minerals  1 tablet Oral Daily   polyethylene glycol  17 g Per Tube Daily   senna  1 tablet Per Tube BID   thiamine (VITAMIN B1) injection  100 mg Intravenous Daily   Continuous Infusions:  feeding supplement (OSMOLITE 1.5 CAL) Stopped (09/06/22 2241)   PRN Meds: docusate sodium  Labs Reviewed: Creatinine 0.43 Mg 1.6 CBG ranges from 65-120 mg/dL over the last 24 hours  NUTRITION - FOCUSED PHYSICAL EXAM: Flowsheet Row Most Recent Value  Orbital Region Moderate depletion  Upper Arm Region Severe depletion  Thoracic and Lumbar Region Severe depletion  Buccal Region Unable to assess  Temple Region Severe depletion  Clavicle Bone Region Moderate depletion  Clavicle and Acromion Bone Region Severe depletion  Scapular Bone Region Moderate depletion  Dorsal Hand Moderate depletion  Patellar Region Severe depletion  [RLE only due to L BKA]  Anterior Thigh Region Severe depletion  Posterior Calf Region Severe depletion  Edema (RD Assessment) None  Hair Reviewed  Eyes Reviewed  Mouth Reviewed  Skin Reviewed  Nails Reviewed    Diet Order:   Diet Order             Diet NPO time specified  Diet effective now                   EDUCATION NEEDS:  Not appropriate for education at this time  Skin:  Skin Assessment: Reviewed RN Assessment  Last BM:  no documented BM  Height:  Ht Readings from Last 1 Encounters:  09/02/22 5\' 4"  (1.626 m)    Weight:  Wt Readings from Last 1 Encounters:   09/07/22 57.4 kg    Ideal Body Weight:  55.6 kg (for BKA (5.9%))  BMI:  Body mass index is 23.1 kg/m. (12/26 wt, adjusted for BKA)  Estimated Nutritional Needs:  Kcal:  1750-1950 Protein:  85-100 grams Fluid:  1.7-1.9 L    05-28-1992, RD, LDN Clinical Dietitian RD pager # available in AMION  After hours/weekend pager # available in [facility]

## 2022-09-07 NOTE — Progress Notes (Signed)
[facility] ADULT ICU REPLACEMENT PROTOCOL   The patient does apply for the [facility] Adult ICU Electrolyte Replacment Protocol based on the criteria listed below:   1.Exclusion criteria: TCTS, ECMO, Dialysis, and Myasthenia Gravis patients 2. Is GFR >/= 30 ml/min? Yes.    Patient's GFR today is >60 3. Is SCr </= 2? Yes.   Patient's SCr is 0.43 mg/dL 4. Did SCr increase >/= 0.5 in 24 hours? No. 5.Pt's weight >40kg  Yes.   6. Abnormal electrolyte(s): mag 1.6  7. Electrolytes replaced per protocol 8.  Call MD STAT for K+ </= 2.5, Phos </= 1, or Mag </= 1 Physician:  n/a  Ronald [name] 09/07/2022 5:52 AM

## 2022-09-08 ENCOUNTER — Inpatient Hospital Stay (HOSPITAL_COMMUNITY): Payer: Medicare Other

## 2022-09-08 DIAGNOSIS — R0602 Shortness of breath: Secondary | ICD-10-CM | POA: Diagnosis not present

## 2022-09-08 DIAGNOSIS — J189 Pneumonia, unspecified organism: Secondary | ICD-10-CM | POA: Diagnosis not present

## 2022-09-08 DIAGNOSIS — J449 Chronic obstructive pulmonary disease, unspecified: Secondary | ICD-10-CM | POA: Diagnosis not present

## 2022-09-08 LAB — CBC
HCT: 29.6 % — ABNORMAL LOW (ref 39.0–52.0)
Hemoglobin: 10.2 g/dL — ABNORMAL LOW (ref 13.0–17.0)
MCH: 31.7 pg (ref 26.0–34.0)
MCHC: 34.5 g/dL (ref 30.0–36.0)
MCV: 91.9 fL (ref 80.0–100.0)
Platelets: 418 10*3/uL — ABNORMAL HIGH (ref 150–400)
RBC: 3.22 MIL/uL — ABNORMAL LOW (ref 4.22–5.81)
RDW: 13 % (ref 11.5–15.5)
WBC: 8.8 10*3/uL (ref 4.0–10.5)
nRBC: 0 % (ref 0.0–0.2)

## 2022-09-08 LAB — BASIC METABOLIC PANEL
Anion gap: 10 (ref 5–15)
BUN: 13 mg/dL (ref 8–23)
CO2: 26 mmol/L (ref 22–32)
Calcium: 8.3 mg/dL — ABNORMAL LOW (ref 8.9–10.3)
Chloride: 98 mmol/L (ref 98–111)
Creatinine, Ser: 0.56 mg/dL — ABNORMAL LOW (ref 0.61–1.24)
GFR, Estimated: >60 mL/min (ref >60)
Glucose, Bld: 82 mg/dL (ref 70–99)
Potassium: 4.2 mmol/L (ref 3.5–5.1)
Sodium: 134 mmol/L — ABNORMAL LOW (ref 135–145)

## 2022-09-08 LAB — LEGIONELLA PNEUMOPHILA SEROGP 1 UR AG: L. pneumophila Serogp 1 Ur Ag: NEGATIVE

## 2022-09-08 LAB — GLUCOSE, CAPILLARY
Glucose-Capillary: 69 mg/dL — ABNORMAL LOW (ref 70–99)
Glucose-Capillary: 85 mg/dL (ref 70–99)
Glucose-Capillary: 84 mg/dL (ref 70–99)
Glucose-Capillary: 83 mg/dL (ref 70–99)
Glucose-Capillary: 94 mg/dL (ref 70–99)
Glucose-Capillary: 99 mg/dL (ref 70–99)

## 2022-09-08 LAB — MAGNESIUM: Magnesium: 2 mg/dL (ref 1.7–2.4)

## 2022-09-08 LAB — AMMONIA: Ammonia: 22 umol/L (ref 9–35)

## 2022-09-08 LAB — PHOSPHORUS: Phosphorus: 3.5 mg/dL (ref 2.5–4.6)

## 2022-09-08 MED ORDER — PHENOBARBITAL SODIUM 65 MG/ML IJ SOLN
32.5000 mg | Freq: Every day | INTRAMUSCULAR | Status: AC
  Administered 2022-09-09 – 2022-09-10 (×2): 32.5 mg via INTRAVENOUS
  Filled 2022-09-08 (×2): qty 1

## 2022-09-08 MED ORDER — THIAMINE MONONITRATE 100 MG PO TABS
100.0000 mg | ORAL_TABLET | Freq: Every day | ORAL | Status: DC
  Administered 2022-09-08: 100 mg
  Filled 2022-09-08: qty 1

## 2022-09-08 MED ORDER — ADULT MULTIVITAMIN W/MINERALS CH
1.0000 | ORAL_TABLET | Freq: Every day | ORAL | Status: DC
  Administered 2022-09-08 – 2022-09-09 (×2): 1
  Filled 2022-09-08 (×2): qty 1

## 2022-09-08 MED ORDER — DEXTROSE 50 % IV SOLN
INTRAVENOUS | Status: AC
  Administered 2022-09-08: 25 mL
  Filled 2022-09-08: qty 50

## 2022-09-08 MED ORDER — FOLIC ACID 1 MG PO TABS
1.0000 mg | ORAL_TABLET | Freq: Every day | ORAL | Status: DC
  Administered 2022-09-08 – 2022-09-10 (×3): 1 mg
  Filled 2022-09-08 (×2): qty 1

## 2022-09-08 MED ORDER — BUDESONIDE 0.5 MG/2ML IN SUSP
0.5000 mg | Freq: Two times a day (BID) | RESPIRATORY_TRACT | Status: DC
  Administered 2022-09-08 – 2022-09-17 (×19): 0.5 mg via RESPIRATORY_TRACT
  Filled 2022-09-08 (×19): qty 2

## 2022-09-08 NOTE — Procedures (Signed)
Cortrak  Person Inserting Tube:  Zymire Turnbo L, RD Tube Type:  Cortrak - 43 inches Tube Size:  10 Tube Location:  Left nare Secured by: Bridle Technique Used to Measure Tube Placement:  Marking at nare/corner of mouth Cortrak Secured At:  63 cm   Cortrak Tube Team Note:  Consult received to place a Cortrak feeding tube.   X-ray is required, abdominal x-ray has been ordered by the Cortrak team. Please confirm tube placement before using the Cortrak tube.   If the tube becomes dislodged please keep the tube and contact the Cortrak team at [email or web address] for replacement.  If after hours and replacement cannot be delayed, place a NG tube and confirm placement with an abdominal x-ray.    Prabhjot Maddux RD, LDN Clinical Dietitian See AMiON for contact information.    

## 2022-09-08 NOTE — Progress Notes (Signed)
Speech Language Pathology Treatment: Dysphagia  Patient Details Name: Ronald [name] MRN: [id number] DOB: October 20, 1955 Today's Date: 09/08/2022 Time: 0910-0930 SLP Time Calculation (min) (ACUTE ONLY): 20 min  Assessment / Plan / Recommendation Clinical Impression  Patient seen by SLP for skilled treatment focused on dysphagia goals. Patient was awake and alert which is a big improvement from yesterday. RN in room assisted with helping SLP reposition in bed and she also reported that he was able to tell her his name earlier. He will be transferring out of ICU today. Patient able to answer basic biographical questions but voice is very low in intensity and speech is dysarthric. He was oriented to self only and when asked where he was said, "someone's house". After repositioning upright in bed, SLP assessed his toleration of PO's: thin liquids, nectar thick liquids, puree solids. He was not able to draw liquid through straw but was able to sip liquids from cup when SLP held up to his lips. Swallow initiation was very delayed and hyolaryngeal elevation appeared to be impaired. He exhibited delayed coughing with both thin liquids and nectar thick liquids. Although he did not exhibit any overt s/s aspiration or penetration with puree solids (applesauce), he continued with delayed swallow initiation, putting him at risk for aspiration even with puree solids. SLP suspects that patient will be able to transition to PO's as he continues to improve with his alertness but recommendation is to continue NPO. Per RN, he is scheduled for Cortrak today and SLP in agreement with that. SLP will continue to follow for PO readiness.    HPI HPI: Patient is aa 66 y.o. male with PMH: COPD, daily smoker, alcohol abuse, left BKA who presented to the hospital on 09/02/22 with dyspnea, increased WOB and wheezing. In ER, his SpO2 was in 80%'s  and he was started on BiPAP but ultimately intubated due to increased respiratory distress.  Chest radiograph did not show opacities or infiltrates, VBG showed pH of 7/269 and pCO2 of 72. Patient extubated on 09/06/22.      SLP Plan  Continue with current plan of care      Recommendations for follow up therapy are one component of a multi-disciplinary discharge planning process, led by the attending physician.  Recommendations may be updated based on patient status, additional functional criteria and insurance authorization.    Recommendations  Diet recommendations: NPO Medication Administration: Via alternative means                Oral Care Recommendations: Oral care QID;Staff/trained caregiver to provide oral care Follow Up Recommendations: Follow physician's recommendations for discharge plan and follow up therapies Assistance recommended at discharge: Intermittent Supervision/Assistance SLP Visit Diagnosis: Dysphagia, unspecified (R13.10) Plan: Continue with current plan of care          Angela Nevin, MA, CCC-SLP Speech Therapy

## 2022-09-08 NOTE — Progress Notes (Addendum)
Nutrition Brief Note  Consult received to start TF  Plan for cortrak placement today.  Orders in for TF once cortrak tube placement confirmed.  Spoke with RN to verify TF orders.   Osmolite 1.5 @ 50 ml/hr (1200 ml/day) Resume at 62mL/h and advance by 19mL q6h to goal of 50 PROSource TF20 60 ml daily Tube feeding regimen at goal rate provides 1880 kcal, 95 grams of protein, and 914 ml of H2O.     Cammy Copa., RD, LDN, CNSC See AMiON for contact information

## 2022-09-08 NOTE — Plan of Care (Signed)
  Problem: Clinical Measurements: Goal: Ability to maintain clinical measurements within normal limits will improve Outcome: Progressing Goal: Will remain free from infection Outcome: Progressing   

## 2022-09-08 NOTE — Progress Notes (Signed)
NAME:  Ronald [name], MRN:  [id number], DOB:  11-18-1955, LOS: 6 ADMISSION DATE:  09/02/2022, CONSULTATION DATE:  09/02/2022 REFERRING MD:  Ronald Lay, MD CHIEF COMPLAINT: Respiratory Failure  History of Present Illness:  Ronald [name] is a 66 y.o. Male with a PMH of COPD, ETOH and substance abuse, Left BKA, Smoker, who presented to the ED with hypoxia. Per family, pt was supposed to have a CXR completed today per PCP, but it was closed and when the patient's daughter picked him up, she noticed his WOB was increasing worse than normal. On arrival, he was noted to be disoriented to date and having audible wheezing, SHOB, and cough with Spo2 of 87%.    EDP concerned for alcohol withdrawal seizure but was too somnolent for sedation without requiring intubation. Therefore, pt was started on BiPAP and then intubated d/t worsening respiratory status. ED workup reveled Na 129, Chloride 86, WBC 14.7. VBG 7.269/72.4/35/33.2. CXR with no acute findings.   PCCM consulted for management of respiratory failure and mechanical ventilation.   Pertinent  Medical History  COPD, ETOH and substance abuse, Left BKA, Smoker  Significant Hospital Events: Including procedures, antibiotic start and stop dates in addition to other pertinent events   12/21 Admitted, Failed BiPAP > Intubated, CT Angio Ordered, azithromycin and rocephin started  12/23 failed weaning attempt 12/24 not able to wean 12/24 weaning today 12/25-extubated this morning  Interim History / Subjective:  Extubated 12/25 Still altered: does have weak congested cough on command, does not speak or state name, PERRL, moves extremities not on command On 3L [employer] Glucose 69 overnight; given have an amp of dextrose  Objective   Blood pressure (!) 153/93, pulse (!) 107, temperature 98 F (36.7 C), temperature source Oral, resp. rate (!) 31, height 5\' 4"  (1.626 m), weight 54.7 kg, SpO2 100 %.        Intake/Output Summary (Last 24 hours) at 09/08/2022  0705 Last data filed at 09/08/2022 0600 Gross per 24 hour  Intake 431.64 ml  Output 1715 ml  Net -1283.36 ml    Filed Weights   09/06/22 0500 09/07/22 0500 09/08/22 0410  Weight: 61.8 kg 57.4 kg 54.7 kg    Examination: General:  ill appearing male in nad HEENT: MM pink/moist; [place] in place Neuro: altered, does have weak congested cough on command, does not speak or state name, PERRL, moves extremities not on command CV: s1s2, tachy 100s, no m/r/g PULM:  dim clear BS bilaterally; [place] 3l; weak congested cough GI: soft, bsx4 active  Extremities: warm/dry, L bka  Skin: no rashes or lesions appreciated  Resolved Hospital Problem list     Assessment & Plan:  Acute on chronic hypoxemic and hypercapneic respiratory failure due to AE-COPD and ?DTs Very questionable airway protection at present, also concerned he is seizing P: -wean [place] for sats >92% -consider weaning steroids -cont brovana, yupelri, and pulmicort -cpt qid -pt/ot  Acute Encephalopathy EtOH dependence -EEG negative for seizure P: -check ammonia -continue phenobarb taper -ciwa protocol -prn ativan -limit sedating meds  Hypoglycemia P: -dextrose given -place cortrak and start TF later today -cbg monitoring -hold ssi  Severe protein calorie malnutrition POA P: -placing cortrak today -TF per dietician  Hx PVD P: -supportive care     Best Practice (right click and "Reselect all SmartList Selections" daily)   Diet/type: NPO DVT prophylaxis: LMWH GI prophylaxis: N/A Lines: N/A Foley:  removal ordered  Code Status:  full code Last date of multidisciplinary goals of care discussion [12/27  updated sister Ronald [name] over phone and updated. She states she is okay with [place] placing feeding tube today.   JD Rexene Agent [facility] 09/08/2022, 7:08 AM  Please see [email or web address] for pager details.  From 7A-7P if no response, please call [phone]. After hours, please call ELink  [phone].

## 2022-09-09 DIAGNOSIS — J189 Pneumonia, unspecified organism: Secondary | ICD-10-CM | POA: Diagnosis not present

## 2022-09-09 DIAGNOSIS — J9600 Acute respiratory failure, unspecified whether with hypoxia or hypercapnia: Secondary | ICD-10-CM | POA: Diagnosis not present

## 2022-09-09 DIAGNOSIS — R0602 Shortness of breath: Secondary | ICD-10-CM | POA: Diagnosis not present

## 2022-09-09 LAB — COMPREHENSIVE METABOLIC PANEL
ALT: 32 U/L (ref 0–44)
AST: 30 U/L (ref 15–41)
Albumin: 2.7 g/dL — ABNORMAL LOW (ref 3.5–5.0)
Alkaline Phosphatase: 56 U/L (ref 38–126)
Anion gap: 7 (ref 5–15)
BUN: 15 mg/dL (ref 8–23)
CO2: 27 mmol/L (ref 22–32)
Calcium: 8.3 mg/dL — ABNORMAL LOW (ref 8.9–10.3)
Chloride: 100 mmol/L (ref 98–111)
Creatinine, Ser: 0.63 mg/dL (ref 0.61–1.24)
GFR, Estimated: >60 mL/min (ref >60)
Glucose, Bld: 116 mg/dL — ABNORMAL HIGH (ref 70–99)
Potassium: 4.6 mmol/L (ref 3.5–5.1)
Sodium: 134 mmol/L — ABNORMAL LOW (ref 135–145)
Total Bilirubin: 0.4 mg/dL (ref 0.3–1.2)
Total Protein: 6.6 g/dL (ref 6.5–8.1)

## 2022-09-09 LAB — GLUCOSE, CAPILLARY
Glucose-Capillary: 103 mg/dL — ABNORMAL HIGH (ref 70–99)
Glucose-Capillary: 112 mg/dL — ABNORMAL HIGH (ref 70–99)
Glucose-Capillary: 90 mg/dL (ref 70–99)
Glucose-Capillary: 91 mg/dL (ref 70–99)

## 2022-09-09 LAB — CBC WITH DIFFERENTIAL/PLATELET
Abs Immature Granulocytes: 0.02 10*3/uL (ref 0.00–0.07)
Basophils Absolute: 0 10*3/uL (ref 0.0–0.1)
Basophils Relative: 0 %
Eosinophils Absolute: 0 10*3/uL (ref 0.0–0.5)
Eosinophils Relative: 0 %
HCT: 31.1 % — ABNORMAL LOW (ref 39.0–52.0)
Hemoglobin: 10.7 g/dL — ABNORMAL LOW (ref 13.0–17.0)
Immature Granulocytes: 0 %
Lymphocytes Relative: 10 %
Lymphs Abs: 0.5 10*3/uL — ABNORMAL LOW (ref 0.7–4.0)
MCH: 31.6 pg (ref 26.0–34.0)
MCHC: 34.4 g/dL (ref 30.0–36.0)
MCV: 91.7 fL (ref 80.0–100.0)
Monocytes Absolute: 0.3 10*3/uL (ref 0.1–1.0)
Monocytes Relative: 6 %
Neutro Abs: 4.5 10*3/uL (ref 1.7–7.7)
Neutrophils Relative %: 84 %
Platelets: 448 10*3/uL — ABNORMAL HIGH (ref 150–400)
RBC: 3.39 MIL/uL — ABNORMAL LOW (ref 4.22–5.81)
RDW: 12.9 % (ref 11.5–15.5)
WBC: 5.4 10*3/uL (ref 4.0–10.5)
nRBC: 0 % (ref 0.0–0.2)

## 2022-09-09 LAB — URINALYSIS, ROUTINE W REFLEX MICROSCOPIC
Bilirubin Urine: NEGATIVE
Glucose, UA: NEGATIVE mg/dL
Hgb urine dipstick: NEGATIVE
Ketones, ur: NEGATIVE mg/dL
Leukocytes,Ua: NEGATIVE
Nitrite: NEGATIVE
Protein, ur: NEGATIVE mg/dL
Specific Gravity, Urine: 1.017 (ref 1.005–1.030)
pH: 9 — ABNORMAL HIGH (ref 5.0–8.0)

## 2022-09-09 LAB — PHOSPHORUS: Phosphorus: 3.4 mg/dL (ref 2.5–4.6)

## 2022-09-09 LAB — MAGNESIUM: Magnesium: 1.7 mg/dL (ref 1.7–2.4)

## 2022-09-09 MED ORDER — THIAMINE HCL 100 MG/ML IJ SOLN
500.0000 mg | Freq: Three times a day (TID) | INTRAVENOUS | Status: AC
  Administered 2022-09-09 – 2022-09-11 (×9): 500 mg via INTRAVENOUS
  Filled 2022-09-09 (×9): qty 5

## 2022-09-09 MED ORDER — THIAMINE MONONITRATE 100 MG PO TABS
100.0000 mg | ORAL_TABLET | Freq: Every day | ORAL | Status: DC
  Administered 2022-09-17: 100 mg via ORAL
  Filled 2022-09-09: qty 1

## 2022-09-09 MED ORDER — IPRATROPIUM-ALBUTEROL 0.5-2.5 (3) MG/3ML IN SOLN
3.0000 mL | Freq: Four times a day (QID) | RESPIRATORY_TRACT | Status: DC
  Administered 2022-09-09 – 2022-09-10 (×2): 3 mL via RESPIRATORY_TRACT
  Filled 2022-09-09 (×4): qty 3

## 2022-09-09 MED ORDER — THIAMINE HCL 100 MG/ML IJ SOLN
250.0000 mg | Freq: Every day | INTRAVENOUS | Status: AC
  Administered 2022-09-12 – 2022-09-16 (×5): 250 mg via INTRAVENOUS
  Filled 2022-09-09 (×5): qty 2.5

## 2022-09-09 NOTE — Progress Notes (Signed)
Speech Language Pathology Treatment: Dysphagia  Patient Details Name: Ronald [name] MRN: [id number] DOB: 1956-05-12 Today's Date: 09/09/2022 Time: 1000-1030 SLP Time Calculation (min) (ACUTE ONLY): 30 min  Assessment / Plan / Recommendation Clinical Impression  Pt seen at bedside for skilled ST intervention focused on assessment of readiness for PO intake. Pt was awake and alert. He had taken one mitt off, and was working on taking the other off. Pt allowed SLP to replace the mitt. He also allowed SLP to perform oral care with suction. Following oral care, pt accepted multiple textures including ice chips, puree, thin liquids, and solid (graham cracker with peanut butter). Oral manipulation was timely, and swallow reflex also appeared timely. Intermittent dry cough was noted. Laryngeal elevation adequate per palpation.  Pt appeared to be fatigued by the end of the session.   Recommend beginning Dys2/thin liquid diet, meds via alternate means. Full supervision/assist is recommended as well, to assist with safe self-feeding. SLP will continue to follow to assess tolerance of Dys2/thin liquid diet, determine if instrumental assessment is needed, and determine readiness to advance.   HPI HPI: Patient is aa 66 y.o. male with PMH: COPD, daily smoker, alcohol abuse, left BKA who presented to the hospital on 09/02/22 with dyspnea, increased WOB and wheezing. In ER, his SpO2 was in 80%'s  and he was started on BiPAP but ultimately intubated due to increased respiratory distress. Chest radiograph did not show opacities or infiltrates, VBG showed pH of 7/269 and pCO2 of 72. Patient extubated on 09/06/22.      SLP Plan  Continue with current plan of care      Recommendations for follow up therapy are one component of a multi-disciplinary discharge planning process, led by the attending physician.  Recommendations may be updated based on patient status, additional functional criteria and insurance  authorization.    Recommendations  Diet recommendations: Dysphagia 2 (fine chop);Thin liquid Liquids provided via: Cup;Straw Medication Administration: Via alternative means Supervision: Full supervision/cueing for compensatory strategies;Staff to assist with self feeding;Patient able to self feed Compensations: Minimize environmental distractions;Slow rate;Small sips/bites Postural Changes and/or Swallow Maneuvers: Seated upright 90 degrees;Upright 30-60 min after meal                Oral Care Recommendations: Oral care QID;Staff/trained caregiver to provide oral care Follow Up Recommendations: Follow physician's recommendations for discharge plan and follow up therapies Assistance recommended at discharge: Intermittent Supervision/Assistance SLP Visit Diagnosis: Dysphagia, unspecified (R13.10) Plan: Continue with current plan of care          Ronald [name], [facility], CCC-SLP Speech Language Pathologist Office: [phone]  Leigh Aurora 09/09/2022, 10:44 AM

## 2022-09-09 NOTE — Evaluation (Signed)
Physical Therapy Evaluation Patient Details Name: Ronald [name] MRN: [id number] DOB: 05/25/1956 Today's Date: 09/09/2022  History of Present Illness  Patient is a 66 y.o. Male with a PMH of COPD, ETOH and substance abuse, Left BKA, Smoker, who presented to the ED with hypoxia. Required intubation due to worsening respiratory status. Extubated 12/25.   Clinical Impression  Patient was agreeable to PT. He is a poor historian but reports he required no physical assistance for transfer to his scooter that he uses for primary means of mobility at baseline. He reports he lives with his parents and then reports living with non-relatives.  Today the patient required Max A for bed mobility. + 2 person assistance required for lateral scoot transfer from bed to chair and back to bed. The patient tolerated being up in the chair for approximately 5 minutes before requesting to return back to the bed for comfort. Sitting balance is poor with external support required to maintain midline and patient is fatigued easily with activity. Recommend to continue PT to maximize independence and facilitate return to prior level of function. SNF is recommended at this time.      Recommendations for follow up therapy are one component of a multi-disciplinary discharge planning process, led by the attending physician.  Recommendations may be updated based on patient status, additional functional criteria and insurance authorization.  Follow Up Recommendations Skilled nursing-short term rehab (<3 hours/day) Can patient physically be transported by private vehicle: No    Assistance Recommended at Discharge Frequent or constant Supervision/Assistance  Patient can return home with the following  Two people to help with walking and/or transfers;A lot of help with bathing/dressing/bathroom;Direct supervision/assist for medications management;Assist for transportation;Help with stairs or ramp for entrance;Assistance with  cooking/housework    Equipment Recommendations  (to be determined at next level of care)  Recommendations for Other Services       Functional Status Assessment Patient has had a recent decline in their functional status and demonstrates the ability to make significant improvements in function in a reasonable and predictable amount of time.     Precautions / Restrictions Precautions Precautions: Fall Restrictions Weight Bearing Restrictions: No      Mobility  Bed Mobility Overal bed mobility: Needs Assistance Bed Mobility: Sit to Supine, Supine to Sit     Supine to sit: Max assist, HOB elevated Sit to supine: Max assist, HOB elevated   General bed mobility comments: verbal cues for sequencing and technique. increased time and effort required    Transfers Overall transfer level: Needs assistance   Transfers: Bed to chair/wheelchair/BSC            Lateral/Scoot Transfers: Mod assist, +2 physical assistance General transfer comment: +2 person assistance required for incremental scoot transfer to and from chair. verbal cues for technique. patient does accept weight through BUE and RLE, however does not attempt to stand for transfer.    Ambulation/Gait                  Stairs            Wheelchair Mobility    Modified Rankin (Stroke Patients Only)       Balance Overall balance assessment: Needs assistance Sitting-balance support: Feet supported Sitting balance-Leahy Scale: Poor (initially zero with maximal assistance required progressing to less assistance with increased sitting time and cues for midline awareness)   Postural control: Right lateral lean  Pertinent Vitals/Pain Pain Assessment Pain Assessment: No/denies pain    Home Living Family/patient expects to be discharged to:: Private residence Living Arrangements: Parent   Type of Home: Apartment Home Access: Level entry          Home Equipment: Electric scooter;BSC/3in1 Additional Comments: patient is a poor historian. he reports he lives with his parents and then mentions staying with other non-relatives.    Prior Function Prior Level of Function : Patient poor historian/Family not available             Mobility Comments: patient reports using scooter as primary means of mobility with no assistance required with bed mobility or transfers ADLs Comments: patient reports he has assistance for ADLs     Hand Dominance        Extremity/Trunk Assessment   Upper Extremity Assessment Upper Extremity Assessment: Defer to OT evaluation    Lower Extremity Assessment Lower Extremity Assessment: Generalized weakness (prior L BKA)       Communication   Communication: No difficulties  Cognition Arousal/Alertness: Awake/alert Behavior During Therapy: Impulsive Overall Cognitive Status: No family/caregiver present to determine baseline cognitive functioning                                 General Comments: patient with decreased safety awareness and awareness for need for physical assistance. patient able to follow single step commands with increased time. disoriented to time and situation        General Comments General comments (skin integrity, edema, etc.): patient tolerated being up in the recliner around 5 minutes before requesting to return to bed.    Exercises     Assessment/Plan    PT Assessment Patient needs continued PT services  PT Problem List Decreased strength;Decreased range of motion;Decreased activity tolerance;Decreased balance;Decreased mobility;Decreased cognition;Decreased knowledge of use of DME;Decreased safety awareness;Decreased knowledge of precautions       PT Treatment Interventions DME instruction;Functional mobility training;Therapeutic activities;Therapeutic exercise;Balance training;Neuromuscular re-education;Cognitive remediation;Patient/family  education;Stair training;Gait training;Wheelchair mobility training    PT Goals (Current goals can be found in the Care Plan section)  Acute Rehab PT Goals Patient Stated Goal: to go home PT Goal Formulation: With patient Time For Goal Achievement: 09/23/22 Potential to Achieve Goals: Fair Additional Goals Additional Goal #1: patient will propel wheelchair 262ft with supervision with no cues for safety in preparation for home mobility    Frequency Min 3X/week     Co-evaluation PT/OT/SLP Co-Evaluation/Treatment: Yes Reason for Co-Treatment: To address functional/ADL transfers;For patient/therapist safety PT goals addressed during session: Mobility/safety with mobility         AM-PAC PT "6 Clicks" Mobility  Outcome Measure Help needed turning from your back to your side while in a flat bed without using bedrails?: A Lot Help needed moving from lying on your back to sitting on the side of a flat bed without using bedrails?: A Lot Help needed moving to and from a bed to a chair (including a wheelchair)?: Total Help needed standing up from a chair using your arms (e.g., wheelchair or bedside chair)?: Total Help needed to walk in hospital room?: Total Help needed climbing 3-5 steps with a railing? : Total 6 Click Score: 8    End of Session Equipment Utilized During Treatment: Gait belt;Oxygen Activity Tolerance: Patient tolerated treatment well Patient left: in bed;with call bell/phone within reach;with bed alarm set (bilateral hand mittens in place) Nurse Communication: Mobility status (confirmed need for  mittens) PT Visit Diagnosis: Muscle weakness (generalized) (M62.81);Unsteadiness on feet (R26.81)    Time: [id number] PT Time Calculation (min) (ACUTE ONLY): 29 min   Charges:   PT Evaluation $PT Eval Low Complexity: 1 Low PT Treatments $Therapeutic Activity: 8-22 mins        Minna Merritts, PT, MPT   Percell Locus 09/09/2022, 9:06 AM

## 2022-09-09 NOTE — Progress Notes (Signed)
PROGRESS NOTE    [place]  [id number] DOB: 07/29/1956 DOA: 09/02/2022 PCP: Donato SchultzLucas, Tiffany N, FNP  Chief Complaint  Patient presents with   Altered Mental Status    Brief Narrative:  Ronald [name] is Ronald [name] 66 y.o. Male with Hanan Moen PMH of COPD, ETOH and substance abuse, Left BKA, Smoker, who presented to the ED with hypoxia. Per family, pt was supposed to have Ronald [name] CXR completed today per PCP, but it was closed and when the patient's daughter picked him up, she noticed his WOB was increasing worse than normal. On arrival, he was noted to be disoriented to date and having audible wheezing, SHOB, and cough with Spo2 of 87%.    EDP concerned for alcohol withdrawal seizure but was too somnolent for sedation without requiring intubation. Therefore, pt was started on BiPAP and then intubated d/t worsening respiratory status. ED workup reveled Na 129, Chloride 86, WBC 14.7. VBG 7.269/72.4/35/33.2. CXR with no acute findings.   PCCM consulted for management of respiratory failure and mechanical ventilation.   Significant Events 12/21 Admitted, Failed BiPAP > Intubated, CT Angio Ordered, azithromycin and rocephin started  12/23 failed weaning attempt 12/24 not able to wean 12/24 weaning today 12/25-extubated this morning   Assessment & Plan:   Principal Problem:   Acute respiratory failure (HCC) Active Problems:   Hyponatremia   ETOH abuse   COPD (chronic obstructive pulmonary disease) (HCC)   Acute hypercapnic respiratory failure (HCC)   Right bundle branch block   Acute encephalopathy   Hx of BKA, left (HCC)   Hypotension   Protein-calorie malnutrition, severe  Acute on chronic hypoxemic and hypercapneic respiratory failure COPD Exacerbation Continue steroids, scheduled nebs, prn nebs.  Wean steroids as tolerated. 12/21-12/25 ceftriaxone.  S/p 3 doses of azithromycin.  Acute Metabolic Encephalopathy Concern for Seizure Like Activity EEG with moderate to severe diffuse  encephalopathy Suspect related to etoh withdrawal, hypercarbia, ICU delirium, ? Etoh dementia High dose thiamine  Follow b12, folate, TSH.   Ammonia is wnl. Head CT 12/21 without acute intracranial abnormality Delirium precautions Additional w/u if does not continue to improve  EtOH dependence Phenobarbital taper CIWA  Encourage cessation   Dysphagia  Hypoglycemia Cortrak was in place, he pulled this out today SLP recommending dysphagia 2, thin liquid Will continue to monitor off tube feeds A1c 5.6, trend BG's  Severe protein calorie malnutrition POA Follow PO intake, leave cortrak out   Hx PVD Doesn't appear to be on any meds Follow outpatient    Hypertension Hold amlodipine and lisinopril for now    DVT prophylaxis: lovenox Code Status: full Family Communication: sister Disposition:   Status is: Inpatient Remains inpatient appropriate because: continued encephalopathy   Consultants:  PCCM  Procedures:  Echo IMPRESSIONS     1. Left ventricular ejection fraction, by estimation, is 65 to 70%. The  left ventricle has normal function. The left ventricle has no regional  wall motion abnormalities. There is mild left ventricular hypertrophy.  Left ventricular diastolic parameters  are consistent with Grade I diastolic dysfunction (impaired relaxation).   2. Right ventricular systolic function is normal. The right ventricular  size is normal. There is normal pulmonary artery systolic pressure.   3. Trivial mitral valve regurgitation.   4. Aortic valve regurgitation is not visualized.   5. Aneurysm of the ascending aorta, measuring 41 mm.   6. The inferior vena cava is dilated in size with <50% respiratory  variability, suggesting right atrial pressure of 15 mmHg.   Antimicrobials:  Anti-infectives (From admission, onward)    Start     Dose/Rate Route Frequency Ordered Stop   09/02/22 1800  azithromycin (ZITHROMAX) 500 mg in sodium chloride 0.9 % 250 mL IVPB   Status:  Discontinued        500 mg 250 mL/hr over 60 Minutes Intravenous Every 24 hours 09/02/22 1755 09/05/22 1213   09/02/22 1800  cefTRIAXone (ROCEPHIN) 2 g in sodium chloride 0.9 % 100 mL IVPB        2 g 200 mL/hr over 30 Minutes Intravenous Every 24 hours 09/02/22 1756 09/06/22 1832       Subjective: No new complaints Asking for his wheelchair so he can leave  Objective: Vitals:   09/08/22 1923 09/08/22 2100 09/09/22 0022 09/09/22 1149  BP: (!) 145/97  123/81   Pulse: (!) 104  99   Resp: 18  20 18   Temp:   99.1 F (37.3 C) 98.1 F (36.7 C)  TempSrc:   Oral Oral  SpO2: 97% 100% 97%   Weight:      Height:        Intake/Output Summary (Last 24 hours) at 09/09/2022 1604 Last data filed at 09/09/2022 1445 Gross per 24 hour  Intake 103 ml  Output 1000 ml  Net -897 ml   Filed Weights   09/06/22 0500 09/07/22 0500 09/08/22 0410  Weight: 61.8 kg 57.4 kg 54.7 kg    Examination:  General exam: Appears calm and comfortable  Respiratory system: unlabored Cardiovascular system: RRR Gastrointestinal system: Abdomen is nondistended, soft and nontender. Cortrak in place Central nervous system: Belvie Iribe&Ox2 (knows he's in hospital, knows it's December), moving all extremities Extremities: no LEE, mittens to hands    Data Reviewed: I have personally reviewed following labs and imaging studies  CBC: Recent Labs  Lab 09/03/22 0415 09/05/22 0731 09/06/22 0632 09/07/22 0340 09/07/22 1141 09/08/22 0229  WBC 10.6* 8.5 10.4 7.9  --  8.8  NEUTROABS  --   --   --  5.4  --   --   HGB 10.1* 8.7* 9.4* 10.8* 10.2* 10.2*  HCT 28.9* 26.0* 27.8* 33.3* 30.0* 29.6*  MCV 91.5 95.6 94.6 97.7  --  91.9  PLT 278 283 326 369  --  418*    Basic Metabolic Panel: Recent Labs  Lab 09/04/22 1629 09/05/22 0731 09/05/22 1635 09/06/22 0632 09/07/22 0340 09/07/22 1141 09/08/22 0229  NA 134* 136  --  139 137 132* 134*  K 5.3* 4.6  --  4.6 4.9 4.3 4.2  CL 98 101  --  102 99  --  98   CO2 28 28  --  28 27  --  26  GLUCOSE 123* 108*  --  121* 75  --  82  BUN 22 17  --  14 10  --  13  CREATININE 0.64 0.60*  --  0.59* 0.43*  --  0.56*  CALCIUM 7.9* 8.0*  --  8.0* 8.6*  --  8.3*  MG 1.7 1.4* 2.0 1.7 1.6*  --  2.0  PHOS 3.1 2.7 3.4 3.1 3.6  --  3.5    GFR: Estimated Creatinine Clearance: 71.2 mL/min (Issacc Merlo) (by C-G formula based on SCr of 0.56 mg/dL (L)).  Liver Function Tests: Recent Labs  Lab 09/02/22 1726 09/07/22 0340  AST 25 62*  ALT 11 49*  ALKPHOS 71 60  BILITOT 1.2 0.3  PROT 6.2* 6.4*  ALBUMIN 3.2* 2.6*    CBG: Recent Labs  Lab 09/08/22 1602 09/08/22 2014  09/09/22 0011 09/09/22 0748 09/09/22 1145  GLUCAP 94 99 91 90 112*     Recent Results (from the past 240 hour(s))  MRSA Next Gen by PCR, Nasal     Status: None   Collection Time: 09/02/22  8:05 PM   Specimen: Nasal Mucosa; Nasal Swab  Result Value Ref Range Status   MRSA by PCR Next Gen NOT DETECTED NOT DETECTED Final    Comment: (NOTE) The GeneXpert MRSA Assay (FDA approved for NASAL specimens only), is one component of [facility] comprehensive MRSA colonization surveillance program. It is not intended to diagnose MRSA infection nor to guide or monitor treatment for MRSA infections. Test performance is not FDA approved in patients less than 43 years old. Performed at [facility], [address] [place], [place] [address]   Culture, blood (Routine X 2) w Reflex to ID Panel     Status: None   Collection Time: 09/02/22  9:03 PM   Specimen: BLOOD LEFT HAND  Result Value Ref Range Status   Specimen Description BLOOD LEFT HAND  Final   Special Requests   Final    BOTTLES DRAWN AEROBIC AND ANAEROBIC Blood Culture results may not be optimal due to an excessive volume of blood received in culture bottles   Culture   Final    NO GROWTH 5 DAYS Performed at [facility], [address], [place], [place] [address]    Report Status 09/07/2022 FINAL  Final  Culture, blood (Routine X 2) w  Reflex to ID Panel     Status: None   Collection Time: 09/02/22  9:03 PM   Specimen: BLOOD LEFT ARM  Result Value Ref Range Status   Specimen Description BLOOD LEFT ARM  Final   Special Requests   Final    BOTTLES DRAWN AEROBIC AND ANAEROBIC Blood Culture adequate volume   Culture   Final    NO GROWTH 5 DAYS Performed at [facility], [address], [place], [place] [address]    Report Status 09/07/2022 FINAL  Final  Respiratory (~20 pathogens) panel by PCR     Status: None   Collection Time: 09/03/22 12:18 AM   Specimen: Nasopharyngeal Swab; Respiratory  Result Value Ref Range Status   Adenovirus NOT DETECTED NOT DETECTED Final   Coronavirus 229E NOT DETECTED NOT DETECTED Final    Comment: (NOTE) The Coronavirus on the Respiratory Panel, DOES NOT test for the novel  Coronavirus (2019 nCoV)    Coronavirus HKU1 NOT DETECTED NOT DETECTED Final   Coronavirus NL63 NOT DETECTED NOT DETECTED Final   Coronavirus OC43 NOT DETECTED NOT DETECTED Final   Metapneumovirus NOT DETECTED NOT DETECTED Final   Rhinovirus / Enterovirus NOT DETECTED NOT DETECTED Final   Influenza [place]bag[place] NOT DETECTED NOT DETECTED Final   Influenza B NOT DETECTED NOT DETECTED Final   Parainfluenza Virus 1 NOT DETECTED NOT DETECTED Final   Parainfluenza Virus 2 NOT DETECTED NOT DETECTED Final   Parainfluenza Virus 3 NOT DETECTED NOT DETECTED Final   Parainfluenza Virus 4 NOT DETECTED NOT DETECTED Final   Respiratory Syncytial Virus NOT DETECTED NOT DETECTED Final   Bordetella pertussis NOT DETECTED NOT DETECTED Final   Bordetella Parapertussis NOT DETECTED NOT DETECTED Final   Chlamydophila pneumoniae NOT DETECTED NOT DETECTED Final   Mycoplasma pneumoniae NOT DETECTED NOT DETECTED Final    Comment: Performed at [facility], [address] [place], [place] [address]         Radiology  Studies: DG Abd Portable 1V  Result Date: 09/08/2022 CLINICAL DATA:  Feeding tube placement. EXAM: PORTABLE ABDOMEN  - 1 VIEW COMPARISON:  September 05, 2022. FINDINGS: Distal tip of feeding tube is seen in proximal stomach. IMPRESSION: Distal tip of feeding tube is seen in proximal stomach. Electronically Signed   By: Lupita Raider M.D.   On: 09/08/2022 14:34        Scheduled Meds:  arformoterol  15 mcg Nebulization BID   budesonide (PULMICORT) nebulizer solution  0.5 mg Nebulization BID   Chlorhexidine Gluconate Cloth  6 each Topical Q0600   docusate  100 mg Per Tube BID   enoxaparin (LOVENOX) injection  40 mg Subcutaneous Q24H   feeding supplement (PROSource TF20)  60 mL Per Tube Daily   folic acid  1 mg Per Tube Daily   methylPREDNISolone (SOLU-MEDROL) injection  40 mg Intravenous Daily   multivitamin with minerals  1 tablet Per Tube Daily   PHENObarbital  32.5 mg Intravenous QHS   polyethylene glycol  17 g Per Tube Daily   revefenacin  175 mcg Nebulization Daily   senna  1 tablet Per Tube BID   sodium chloride flush  3 mL Intravenous Q12H   [START ON 09/17/2022] thiamine  100 mg Oral Daily   Continuous Infusions:  sodium chloride 10 mL/hr at 09/08/22 0600   sodium chloride     feeding supplement (OSMOLITE 1.5 CAL) Stopped (09/09/22 1345)   thiamine (VITAMIN B1) injection 500 mg (09/09/22 1455)   Followed by   Melene Muller ON 09/12/2022] thiamine (VITAMIN B1) injection       LOS: 7 days    Time spent: over 30 min    Lacretia Nicks, MD [facility]   To contact the attending provider between 7A-7P or the covering provider during after hours 7P-7A, please log into the web site [email or web address] and access using universal [place] password for that web site. If you do not have the password, please call the hospital operator.  09/09/2022, 4:04 PM

## 2022-09-09 NOTE — [place] FL2 (Signed)
MEDICAID FL2 LEVEL OF CARE FORM     IDENTIFICATION  Patient Name: Ronald [name] Birthdate: [birth year] Sex: male Admission Date (Current Location): 09/02/2022  [facility] and [name] Number:  Producer, television/film/video and Address:  [facility] [place], [address] [place], [place] [address]      Provider Number: [id number]  Attending Physician Name and Address:  Zigmund Daniel., *  Relative Name and Phone Number:       Current Level of Care: Hospital Recommended Level of Care: Skilled Nursing Facility Prior Approval Number:    Date Approved/Denied:   PASRR Number: [id number]  Discharge Plan: SNF    Current Diagnoses: Patient Active Problem List   Diagnosis Date Noted   Protein-calorie malnutrition, severe 09/04/2022   Acute hypercapnic respiratory failure (HCC) 09/02/2022   Hx of BKA, left (HCC) 09/02/2022   Hypotension 09/02/2022   Right bundle branch block 09/02/2022   Acute respiratory failure (HCC) 09/02/2022   Hyponatremia 03/26/2019   Altered mental status 03/26/2019   Acute hypoxemic respiratory failure (HCC) 03/26/2019   Dysphagia 03/26/2019   Acute encephalopathy 03/18/2019   COPD exacerbation (HCC) 08/29/2017   Subdural hematoma (HCC) 06/19/2014   Critical lower limb ischemia (HCC) 12/25/2013   Osteomyelitis (HCC) 11/06/2013   HTN (hypertension), benign 11/06/2013   ETOH abuse 11/06/2013   Osteomyelitis of left foot (HCC) 11/06/2013   COPD (chronic obstructive pulmonary disease) (HCC) 11/06/2013   Accelerated hypertension 09/25/2013   Hypokalemia 09/25/2013   Abscess of left foot 09/21/2013   Cellulitis of left foot 09/19/2013    Orientation RESPIRATION BLADDER Height & Weight     Time, Self  O2 (2L [place]) Incontinent Weight: 120 lb 9.5 oz (54.7 kg) Height:  5\' 4"  (162.6 cm)  BEHAVIORAL SYMPTOMS/MOOD NEUROLOGICAL BOWEL NUTRITION STATUS      Incontinent Diet (Dysphagia 2 (fine chop);Thin liquid)  AMBULATORY STATUS  COMMUNICATION OF NEEDS Skin   Extensive Assist   Normal                       Personal Care Assistance Level of Assistance  Bathing, Feeding, Dressing Bathing Assistance: Maximum assistance Feeding assistance: Limited assistance Dressing Assistance: Maximum assistance     Functional Limitations Info  Sight, Hearing, Speech Sight Info: Adequate Hearing Info: Adequate Speech Info: Adequate    SPECIAL CARE FACTORS FREQUENCY  PT (By licensed PT), OT (By licensed OT), Speech therapy                    Contractures Contractures Info: Not present    Additional Factors Info  Code Status Code Status Info: FULL CODE             Current Medications (09/09/2022):  This is the current hospital active medication list Current Facility-Administered Medications  Medication Dose Route Frequency Provider Last Rate Last Admin   0.9 %  sodium chloride infusion  250 mL Intravenous Continuous 09/11/2022, MD 10 mL/hr at 09/08/22 0600 Infusion Verify at 09/08/22 0600   0.9 %  sodium chloride infusion  250 mL Intravenous PRN 09/10/22, NP       arformoterol [facility]) nebulizer solution 15 mcg  15 mcg Nebulization BID [facility], MD   15 mcg at 09/09/22 0748   budesonide (PULMICORT) nebulizer solution 0.5 mg  0.5 mg Nebulization BID 09/11/22, PA-C   0.5 mg at 09/09/22 09/11/22   Chlorhexidine Gluconate Cloth 2 % PADS 6  each  6 each Topical Q0600 Martina Sinner, MD   6 each at 09/09/22 0700   docusate (COLACE) 50 MG/5ML liquid 100 mg  100 mg Per Tube BID Simonne Martinet, NP   100 mg at 09/08/22 2131   docusate sodium (COLACE) capsule 100 mg  100 mg Oral BID PRN Simonne Martinet, NP       enoxaparin (LOVENOX) injection 40 mg  40 mg Subcutaneous Q24H Simonne Martinet, NP   40 mg at 09/08/22 1711   feeding supplement (OSMOLITE 1.5 CAL) liquid 1,000 mL  1,000 mL Per Tube Continuous Olalere, Adewale A, MD 50 mL/hr at 09/08/22 1515 1,000 mL at 09/08/22 1515   feeding  supplement (PROSource TF20) liquid 60 mL  60 mL Per Tube Daily Olalere, Adewale A, MD   60 mL at 09/09/22 0946   folic acid (FOLVITE) tablet 1 mg  1 mg Per Tube Daily Calton Dach I, RPH   1 mg at 09/09/22 0946   hydrALAZINE (APRESOLINE) injection 10 mg  10 mg Intravenous Q6H PRN Oretha Milch, MD   10 mg at 09/07/22 0308   LORazepam (ATIVAN) injection 1-2 mg  1-2 mg Intravenous Q1H PRN Olalere, Adewale A, MD   2 mg at 09/07/22 0542   methylPREDNISolone sodium succinate (SOLU-MEDROL) 40 mg/mL injection 40 mg  40 mg Intravenous Daily Simonne Martinet, NP   40 mg at 09/09/22 0946   multivitamin with minerals tablet 1 tablet  1 tablet Per Tube Daily Calton Dach I, RPH   1 tablet at 09/09/22 1829   Oral care mouth rinse  15 mL Mouth Rinse PRN Martina Sinner, MD   15 mL at 09/08/22 0406   Oral care mouth rinse  15 mL Mouth Rinse QID PRN Olalere, Adewale A, MD   15 mL at 09/08/22 0542   PHENObarbital (LUMINAL) injection 32.5 mg  32.5 mg Intravenous QHS Pia Mau D, PA-C       polyethylene glycol (MIRALAX / GLYCOLAX) packet 17 g  17 g Per Tube Daily Simonne Martinet, NP   17 g at 09/05/22 1108   revefenacin (YUPELRI) nebulizer solution 175 mcg  175 mcg Nebulization Daily Lorin Glass, MD   175 mcg at 09/08/22 0731   senna (SENOKOT) tablet 8.6 mg  1 tablet Per Tube BID Olalere, Adewale A, MD   8.6 mg at 09/09/22 0946   sodium chloride flush (NS) 0.9 % injection 3 mL  3 mL Intravenous Q12H Simonne Martinet, NP   3 mL at 09/09/22 0947   sodium chloride flush (NS) 0.9 % injection 3 mL  3 mL Intravenous PRN Simonne Martinet, NP       thiamine (VITAMIN B1) 500 mg in normal saline (50 mL) IVPB  500 mg Intravenous Q8H Zigmund Daniel., MD 100 mL/hr at 09/09/22 1148 500 mg at 09/09/22 1148   Followed by   Melene Muller ON 09/12/2022] thiamine (VITAMIN B1) 250 mg in sodium chloride 0.9 % 50 mL IVPB  250 mg Intravenous Daily Zigmund Daniel., MD       Followed by   Melene Muller ON 09/17/2022]  thiamine (VITAMIN B1) tablet 100 mg  100 mg Oral Daily Zigmund Daniel., MD         Discharge Medications: Please see discharge summary for a list of discharge medications.  Relevant Imaging Results:  Relevant Lab Results:   Additional Information SS# [id number]  Dellie Burns [place], [place]

## 2022-09-09 NOTE — Care Management (Signed)
  Transition of Care [facility]) Screening Note   Patient Details  Name: [name] Date of Birth: 1956/03/09   Transition of Care [facility]) CM/SW Contact:    Gala Lewandowsky, RN Phone Number: 09/09/2022, 12:39 PM    Transition of Care Department [facility]) has reviewed the patient. Patient is a transfer from 2-M. PT/OT has recommended SNF. CSW will consult for bed offers and SNF Placement. TOC will continue to monitor patient advancement through interdisciplinary progression rounds. If new patient transition needs arise, please place a TOC consult.

## 2022-09-09 NOTE — TOC Initial Note (Signed)
Transition of Care [facility]) - Initial/Assessment Note    Patient Details  Name: Ronald [name] MRN: [id number] Date of Birth: [birth year]  Transition of Care [facility]) CM/SW Contact:    Deatra Robinson, [place] Phone Number: 09/09/2022, 2:21 PM  Clinical Narrative: Spoke with pt's sister [name] PT recommendation for SNF. Pt with previous SN[facility] stay at [facility] in 2015. Reviewed SNF placement process and answered questions. Sister reports no preferred facility. Will begin SNF search and f/u with offers as available.               Dellie Burns, MSW, LCSW [phone] (coverage)      Expected Discharge Plan: Skilled Nursing Facility Barriers to Discharge: Continued Medical Work up   Patient Goals and CMS Choice     Choice offered to / list presented to : Sibling      Expected Discharge Plan and Services     Post Acute Care Choice: Skilled Nursing Facility Living arrangements for the past 2 months: Apartment                                      Prior Living Arrangements/Services Living arrangements for the past 2 months: Apartment            Need for Family Participation in Patient Care: Yes (Comment) Care giver support system in place?: No (comment)   Criminal Activity/Legal Involvement Pertinent to Current Situation/Hospitalization: No - Comment as needed  Activities of Daily Living      Permission Sought/Granted Permission sought to share information with : Facility Industrial/product designer granted to share information with : Yes, Verbal Permission Granted              Emotional Assessment       Orientation: : Fluctuating Orientation (Suspected and/or reported Sundowners) Alcohol / Substance Use: Illicit Drugs, Alcohol Use Psych Involvement: No (comment)  Admission diagnosis:  Acute respiratory failure (HCC) [J96.00] Hypoxia [R09.02] Chronic obstructive pulmonary disease, unspecified COPD type (HCC) [J44.9] Patient Active  Problem List   Diagnosis Date Noted   Protein-calorie malnutrition, severe 09/04/2022   Acute hypercapnic respiratory failure (HCC) 09/02/2022   Hx of BKA, left (HCC) 09/02/2022   Hypotension 09/02/2022   Right bundle branch block 09/02/2022   Acute respiratory failure (HCC) 09/02/2022   Hyponatremia 03/26/2019   Altered mental status 03/26/2019   Acute hypoxemic respiratory failure (HCC) 03/26/2019   Dysphagia 03/26/2019   Acute encephalopathy 03/18/2019   COPD exacerbation (HCC) 08/29/2017   Subdural hematoma (HCC) 06/19/2014   Critical lower limb ischemia (HCC) 12/25/2013   Osteomyelitis (HCC) 11/06/2013   HTN (hypertension), benign 11/06/2013   ETOH abuse 11/06/2013   Osteomyelitis of left foot (HCC) 11/06/2013   COPD (chronic obstructive pulmonary disease) (HCC) 11/06/2013   Accelerated hypertension 09/25/2013   Hypokalemia 09/25/2013   Abscess of left foot 09/21/2013   Cellulitis of left foot 09/19/2013   PCP:  Donato Schultz, FNP Pharmacy:   [facility] [phone] Ginette Otto, [place] - [address] [place] [address] Phone: [phone] Fax: [phone]  [facility] [address] [place] [address] Phone: [phone] Fax: [phone]     Social Determinants of Health (SDOH) Social History: SDOH Screenings   Tobacco Use: High Risk (04/15/2022)   SDOH Interventions:     Readmission Risk Interventions     No data to display

## 2022-09-09 NOTE — Evaluation (Addendum)
Occupational Therapy Evaluation Patient Details Name: Ronald [name] MRN: [id number] DOB: 20-Aug-1956 Today's Date: 09/09/2022   History of Present Illness Patient is a 66 y.o. Male with a PMH of COPD, ETOH and substance abuse, Left BKA, Smoker, who presented to the ED with hypoxia. Required intubation due to worsening respiratory status. Extubated 12/25.   Clinical Impression   Pt questionable historian, reports he lives with his parents at baseline and has assist for ADLs, uses scooter for mobility. Pt currently needing max A for bed mobility, mod A +2 for lateral scoot transfers from chair <> bed. Pt with decreased awareness of deficits, wanting someone to bring him "his scooter so he can get out of here". Pt presenting with impairments listed below, will follow acutely. Recommend SNF at d/c.      Recommendations for follow up therapy are one component of a multi-disciplinary discharge planning process, led by the attending physician.  Recommendations may be updated based on patient status, additional functional criteria and insurance authorization.   Follow Up Recommendations  Skilled nursing-short term rehab (<3 hours/day)     Assistance Recommended at Discharge Frequent or constant Supervision/Assistance  Patient can return home with the following A lot of help with walking and/or transfers;A lot of help with bathing/dressing/bathroom;Assistance with cooking/housework;Assist for transportation;Help with stairs or ramp for entrance    Functional Status Assessment  Patient has had a recent decline in their functional status and demonstrates the ability to make significant improvements in function in a reasonable and predictable amount of time.  Equipment Recommendations  Other (comment) (defer)    Recommendations for Other Services PT consult     Precautions / Restrictions Precautions Precautions: Fall Precaution Comments: NG tube Restrictions Weight Bearing Restrictions: No       Mobility Bed Mobility Overal bed mobility: Needs Assistance Bed Mobility: Sit to Supine, Supine to Sit     Supine to sit: Max assist, HOB elevated Sit to supine: Max assist, HOB elevated        Transfers Overall transfer level: Needs assistance   Transfers: Bed to chair/wheelchair/BSC            Lateral/Scoot Transfers: Mod assist, +2 physical assistance General transfer comment: +2 person assistance required for incremental scoot transfer to and from chair. verbal cues for technique. patient does accept weight through BUE and RLE, however does not attempt to stand for transfer.      Balance Overall balance assessment: Needs assistance Sitting-balance support: Feet supported Sitting balance-Leahy Scale: Poor Sitting balance - Comments: approaching fair by end of session                                   ADL either performed or assessed with clinical judgement   ADL Overall ADL's : Needs assistance/impaired Eating/Feeding: NPO   Grooming: Minimal assistance   Upper Body Bathing: Moderate assistance   Lower Body Bathing: Moderate assistance   Upper Body Dressing : Moderate assistance   Lower Body Dressing: Maximal assistance   Toilet Transfer: Moderate assistance;+2 for physical assistance   Toileting- Clothing Manipulation and Hygiene: Maximal assistance       Functional mobility during ADLs: Moderate assistance;+2 for physical assistance       Vision   Vision Assessment?: No apparent visual deficits     Perception Perception Perception Tested?: No   Praxis Praxis Praxis tested?: Not tested    Pertinent Vitals/Pain Pain Assessment Pain Assessment: No/denies pain  Hand Dominance     Extremity/Trunk Assessment Upper Extremity Assessment Upper Extremity Assessment: Generalized weakness   Lower Extremity Assessment Lower Extremity Assessment: Defer to PT evaluation       Communication Communication Communication:  No difficulties   Cognition Arousal/Alertness: Awake/alert Behavior During Therapy: Impulsive Overall Cognitive Status: No family/caregiver present to determine baseline cognitive functioning                                 General Comments: patient with decreased safety awareness and awareness for need for physical assistance. patient able to follow single step commands with increased time. disoriented to time and situation     General Comments  VSS on RA    Exercises     Shoulder Instructions      Home Living Family/patient expects to be discharged to:: Private residence Living Arrangements: Parent Available Help at Discharge: Available 24 hours/day Type of Home: Apartment Home Access: Level entry                     Home Equipment: Electric scooter;BSC/3in1   Additional Comments: patient is a poor historian. he reports he lives with his parents and then mentions staying with other non-relatives.      Prior Functioning/Environment Prior Level of Function : Patient poor historian/Family not available             Mobility Comments: patient reports using scooter as primary means of mobility with no assistance required with bed mobility or transfers ADLs Comments: patient reports he has assistance for ADLs        OT Problem List: Decreased strength;Decreased range of motion;Impaired balance (sitting and/or standing);Decreased activity tolerance;Decreased cognition;Decreased safety awareness      OT Treatment/Interventions: Self-care/ADL training;Therapeutic exercise;DME and/or AE instruction;Energy conservation;Therapeutic activities;Patient/family education;Balance training    OT Goals(Current goals can be found in the care plan section) Acute Rehab OT Goals Patient Stated Goal: none stated OT Goal Formulation: With patient Time For Goal Achievement: 09/23/22 Potential to Achieve Goals: Good ADL Goals Pt Will Perform Upper Body Dressing:  with min assist;sitting Pt Will Perform Lower Body Dressing: with min assist;sit to/from stand Pt Will Transfer to Toilet: with min assist;ambulating;bedside commode Additional ADL Goal #1: pt will sit EOB x5 min with fair balance in prep for ADLs  OT Frequency: Min 2X/week    Co-evaluation PT/OT/SLP Co-Evaluation/Treatment: Yes Reason for Co-Treatment: To address functional/ADL transfers PT goals addressed during session: Mobility/safety with mobility OT goals addressed during session: ADL's and self-care      AM-PAC OT "6 Clicks" Daily Activity     Outcome Measure Help from another person eating meals?: Total Help from another person taking care of personal grooming?: A Lot Help from another person toileting, which includes using toliet, bedpan, or urinal?: A Lot Help from another person bathing (including washing, rinsing, drying)?: A Lot Help from another person to put on and taking off regular upper body clothing?: A Lot Help from another person to put on and taking off regular lower body clothing?: A Lot 6 Click Score: 11   End of Session Equipment Utilized During Treatment: Gait belt;Oxygen Nurse Communication: Mobility status  Activity Tolerance: Patient tolerated treatment well Patient left: in bed;with call bell/phone within reach;with bed alarm set; bilateral hand mitts reapplied  OT Visit Diagnosis: Unsteadiness on feet (R26.81);Other abnormalities of gait and mobility (R26.89);Muscle weakness (generalized) (M62.81)  Time: 8466-5993 OT Time Calculation (min): 29 min Charges:  OT General Charges $OT Visit: 1 Visit OT Evaluation $OT Eval Moderate Complexity: 1 Mod  Jodie Leiner K, OTD, OTR/L SecureChat Preferred Acute Rehab [phone]   Alveena Taira K Koonce 09/09/2022, 11:28 AM

## 2022-09-10 ENCOUNTER — Inpatient Hospital Stay (HOSPITAL_COMMUNITY): Payer: Medicare Other

## 2022-09-10 DIAGNOSIS — R0602 Shortness of breath: Secondary | ICD-10-CM | POA: Diagnosis not present

## 2022-09-10 DIAGNOSIS — J189 Pneumonia, unspecified organism: Secondary | ICD-10-CM | POA: Diagnosis not present

## 2022-09-10 DIAGNOSIS — J9600 Acute respiratory failure, unspecified whether with hypoxia or hypercapnia: Secondary | ICD-10-CM | POA: Diagnosis not present

## 2022-09-10 LAB — CBC WITH DIFFERENTIAL/PLATELET
Abs Immature Granulocytes: 0.04 10*3/uL (ref 0.00–0.07)
Basophils Absolute: 0 10*3/uL (ref 0.0–0.1)
Basophils Relative: 0 %
Eosinophils Absolute: 0.2 10*3/uL (ref 0.0–0.5)
Eosinophils Relative: 4 %
HCT: 30.5 % — ABNORMAL LOW (ref 39.0–52.0)
Hemoglobin: 10.5 g/dL — ABNORMAL LOW (ref 13.0–17.0)
Immature Granulocytes: 1 %
Lymphocytes Relative: 24 %
Lymphs Abs: 1.5 10*3/uL (ref 0.7–4.0)
MCH: 31.8 pg (ref 26.0–34.0)
MCHC: 34.4 g/dL (ref 30.0–36.0)
MCV: 92.4 fL (ref 80.0–100.0)
Monocytes Absolute: 0.6 10*3/uL (ref 0.1–1.0)
Monocytes Relative: 9 %
Neutro Abs: 3.9 10*3/uL (ref 1.7–7.7)
Neutrophils Relative %: 62 %
Platelets: 453 10*3/uL — ABNORMAL HIGH (ref 150–400)
RBC: 3.3 MIL/uL — ABNORMAL LOW (ref 4.22–5.81)
RDW: 12.9 % (ref 11.5–15.5)
WBC: 6.2 10*3/uL (ref 4.0–10.5)
nRBC: 0 % (ref 0.0–0.2)

## 2022-09-10 LAB — COMPREHENSIVE METABOLIC PANEL
ALT: 31 U/L (ref 0–44)
AST: 30 U/L (ref 15–41)
Albumin: 2.8 g/dL — ABNORMAL LOW (ref 3.5–5.0)
Alkaline Phosphatase: 52 U/L (ref 38–126)
Anion gap: 10 (ref 5–15)
BUN: 12 mg/dL (ref 8–23)
CO2: 27 mmol/L (ref 22–32)
Calcium: 8.5 mg/dL — ABNORMAL LOW (ref 8.9–10.3)
Chloride: 98 mmol/L (ref 98–111)
Creatinine, Ser: 0.47 mg/dL — ABNORMAL LOW (ref 0.61–1.24)
GFR, Estimated: >60 mL/min (ref >60)
Glucose, Bld: 84 mg/dL (ref 70–99)
Potassium: 3.9 mmol/L (ref 3.5–5.1)
Sodium: 135 mmol/L (ref 135–145)
Total Bilirubin: 0.7 mg/dL (ref 0.3–1.2)
Total Protein: 6.8 g/dL (ref 6.5–8.1)

## 2022-09-10 LAB — VITAMIN B12: Vitamin B-12: 703 pg/mL (ref 180–914)

## 2022-09-10 LAB — FOLATE: Folate: 11.6 ng/mL (ref >5.9)

## 2022-09-10 LAB — GLUCOSE, CAPILLARY
Glucose-Capillary: 93 mg/dL (ref 70–99)
Glucose-Capillary: 84 mg/dL (ref 70–99)
Glucose-Capillary: 92 mg/dL (ref 70–99)
Glucose-Capillary: 112 mg/dL — ABNORMAL HIGH (ref 70–99)
Glucose-Capillary: 100 mg/dL — ABNORMAL HIGH (ref 70–99)

## 2022-09-10 LAB — MAGNESIUM: Magnesium: 1.7 mg/dL (ref 1.7–2.4)

## 2022-09-10 LAB — TSH: TSH: 1.999 u[IU]/mL (ref 0.350–4.500)

## 2022-09-10 LAB — PHOSPHORUS: Phosphorus: 3.1 mg/dL (ref 2.5–4.6)

## 2022-09-10 MED ORDER — IPRATROPIUM-ALBUTEROL 0.5-2.5 (3) MG/3ML IN SOLN: 3.0000 mL | Freq: Two times a day (BID) | RESPIRATORY_TRACT | Status: DC

## 2022-09-10 MED ORDER — SENNA 8.6 MG PO TABS
1.0000 | ORAL_TABLET | Freq: Two times a day (BID) | ORAL | Status: DC
  Administered 2022-09-10 – 2022-09-17 (×3): 8.6 mg via ORAL
  Filled 2022-09-10 (×10): qty 1

## 2022-09-10 MED ORDER — ALBUTEROL SULFATE (2.5 MG/3ML) 0.083% IN NEBU: 2.5000 mg | INHALATION_SOLUTION | RESPIRATORY_TRACT | Status: DC | PRN

## 2022-09-10 MED ORDER — DOCUSATE SODIUM 100 MG PO CAPS
100.0000 mg | ORAL_CAPSULE | Freq: Two times a day (BID) | ORAL | Status: DC
  Administered 2022-09-10 – 2022-09-17 (×3): 100 mg via ORAL
  Filled 2022-09-10 (×10): qty 1

## 2022-09-10 MED ORDER — FOLIC ACID 1 MG PO TABS
1.0000 mg | ORAL_TABLET | Freq: Every day | ORAL | Status: DC
  Administered 2022-09-11 – 2022-09-17 (×7): 1 mg via ORAL
  Filled 2022-09-10 (×8): qty 1

## 2022-09-10 MED ORDER — PREDNISONE 10 MG PO TABS
10.0000 mg | ORAL_TABLET | Freq: Every day | ORAL | Status: AC
  Administered 2022-09-15 – 2022-09-16 (×2): 10 mg via ORAL
  Filled 2022-09-10 (×2): qty 1

## 2022-09-10 MED ORDER — IBUPROFEN 200 MG PO TABS
400.0000 mg | ORAL_TABLET | Freq: Four times a day (QID) | ORAL | Status: DC | PRN
  Administered 2022-09-10 – 2022-09-16 (×9): 400 mg via ORAL
  Filled 2022-09-10 (×9): qty 2

## 2022-09-10 MED ORDER — POLYETHYLENE GLYCOL 3350 17 G PO PACK
17.0000 g | PACK | Freq: Every day | ORAL | Status: DC
  Administered 2022-09-11 – 2022-09-13 (×2): 17 g via ORAL
  Filled 2022-09-10 (×5): qty 1

## 2022-09-10 MED ORDER — PREDNISONE 20 MG PO TABS
30.0000 mg | ORAL_TABLET | Freq: Every day | ORAL | Status: AC
  Administered 2022-09-11 – 2022-09-12 (×2): 30 mg via ORAL
  Filled 2022-09-10 (×2): qty 1

## 2022-09-10 MED ORDER — PREDNISONE 20 MG PO TABS
20.0000 mg | ORAL_TABLET | Freq: Every day | ORAL | Status: AC
  Administered 2022-09-13 – 2022-09-14 (×2): 20 mg via ORAL
  Filled 2022-09-10 (×2): qty 1

## 2022-09-10 MED ORDER — ADULT MULTIVITAMIN W/MINERALS CH
1.0000 | ORAL_TABLET | Freq: Every day | ORAL | Status: DC
  Administered 2022-09-10 – 2022-09-17 (×8): 1 via ORAL
  Filled 2022-09-10 (×8): qty 1

## 2022-09-10 NOTE — Progress Notes (Signed)
PROGRESS NOTE    [name]  ZDG:[id number] DOB: 1956/06/27 DOA: 09/02/2022 PCP: Donato SchultzLucas, Tiffany N, FNP  Chief Complaint  Patient presents with   Altered Mental Status    Brief Narrative:  Ronald [name] is Ronald [name] 66 y.o. Male with [name] PMH of COPD, ETOH and substance abuse, Left BKA, Smoker, who presented to the ED with hypoxia. Per family, pt was supposed to have Ronald [name] CXR completed today per PCP, but it was closed and when the patient's daughter picked him up, she noticed his WOB was increasing worse than normal. On arrival, he was noted to be disoriented to date and having audible wheezing, SHOB, and cough with Spo2 of 87%.    EDP concerned for alcohol withdrawal seizure but was too somnolent for sedation without requiring intubation. Therefore, pt was started on BiPAP and then intubated d/t worsening respiratory status. ED workup reveled Na 129, Chloride 86, WBC 14.7. VBG 7.269/72.4/35/33.2. CXR with no acute findings.   PCCM consulted for management of respiratory failure and mechanical ventilation.   Significant Events 12/21 Admitted, Failed BiPAP > Intubated, CT Angio Ordered, azithromycin and rocephin started  12/23 failed weaning attempt 12/24 not able to wean 12/24 weaning today 12/25-extubated this morning   Assessment & Plan:   Principal Problem:   Acute respiratory failure (HCC) Active Problems:   Hyponatremia   ETOH abuse   COPD (chronic obstructive pulmonary disease) (HCC)   Acute hypercapnic respiratory failure (HCC)   Right bundle branch block   Acute encephalopathy   Hx of BKA, left (HCC)   Hypotension   Protein-calorie malnutrition, severe  Acute on chronic hypoxemic and hypercapneic respiratory failure COPD Exacerbation Continue steroids, scheduled nebs, prn nebs.  Will transition to oral steroids. 12/21-12/25 ceftriaxone.  S/p 3 doses of azithromycin. CXR with improved aeration of the lung bases with coarsened interstitial markings  Acute Metabolic  Encephalopathy Concern for Seizure Like Activity EEG with moderate to severe diffuse encephalopathy Suspect related to etoh withdrawal, hypercarbia, ICU delirium, ? Etoh dementia High dose thiamine  Follow b12, folate, TSH -> wnl Ammonia is wnl. Head CT 12/21 without acute intracranial abnormality Delirium precautions Additional w/u if does not continue to improve  EtOH dependence Phenobarbital taper CIWA  Encourage cessation   Dysphagia  Hypoglycemia Cortrak was in place, he pulled this out 12/28 SLP recommending dysphagia 2, nectar thick liquid Noted intermittent cough, si/sx aspiration -> he declined MBS with SLP Will continue to monitor off tube feeds A1c 5.6, trend BG's  Severe protein calorie malnutrition POA Follow PO intake, leave cortrak out   Hx PVD Doesn't appear to be on any meds Follow outpatient    Hypertension Hold amlodipine and lisinopril for now    DVT prophylaxis: lovenox Code Status: full Family Communication: sisters at bedside Disposition:   Status is: Inpatient Remains inpatient appropriate because: continued encephalopathy   Consultants:  PCCM  Procedures:  Echo IMPRESSIONS     1. Left ventricular ejection fraction, by estimation, is 65 to 70%. The  left ventricle has normal function. The left ventricle has no regional  wall motion abnormalities. There is mild left ventricular hypertrophy.  Left ventricular diastolic parameters  are consistent with Grade I diastolic dysfunction (impaired relaxation).   2. Right ventricular systolic function is normal. The right ventricular  size is normal. There is normal pulmonary artery systolic pressure.   3. Trivial mitral valve regurgitation.   4. Aortic valve regurgitation is not visualized.   5. Aneurysm of the ascending aorta, measuring 41 mm.  6. The inferior vena cava is dilated in size with <50% respiratory  variability, suggesting right atrial pressure of 15 mmHg.   Antimicrobials:   Anti-infectives (From admission, onward)    Start     Dose/Rate Route Frequency Ordered Stop   09/02/22 1800  azithromycin (ZITHROMAX) 500 mg in sodium chloride 0.9 % 250 mL IVPB  Status:  Discontinued        500 mg 250 mL/hr over 60 Minutes Intravenous Every 24 hours 09/02/22 1755 09/05/22 1213   09/02/22 1800  cefTRIAXone (ROCEPHIN) 2 g in sodium chloride 0.9 % 100 mL IVPB        2 g 200 mL/hr over 30 Minutes Intravenous Every 24 hours 09/02/22 1756 09/06/22 1832       Subjective: No new complaints Sisters at bedside  Objective: Vitals:   09/10/22 0500 09/10/22 0724 09/10/22 0729 09/10/22 0757  BP:    128/72  Pulse:    90  Resp:    20  Temp:    97.9 F (36.6 C)  TempSrc:    Oral  SpO2:  93% 94% 96%  Weight: 55.6 kg     Height:        Intake/Output Summary (Last 24 hours) at 09/10/2022 1546 Last data filed at 09/10/2022 1100 Gross per 24 hour  Intake 970 ml  Output --  Net 970 ml   Filed Weights   09/08/22 0410 09/09/22 0500 09/10/22 0500  Weight: 54.7 kg 55.6 kg 55.6 kg    Examination:  General: No acute distress. Cardiovascular: RRR Lungs: unlabored Abdomen: Soft, nontender, nondistended  Neurological: more alert and appropriate today, moving all extremities Extremities: L BKA    Data Reviewed: I have personally reviewed following labs and imaging studies  CBC: Recent Labs  Lab 09/06/22 0632 09/07/22 0340 09/07/22 1141 09/08/22 0229 09/09/22 1637 09/10/22 0609  WBC 10.4 7.9  --  8.8 5.4 6.2  NEUTROABS  --  5.4  --   --  4.5 3.9  HGB 9.4* 10.8* 10.2* 10.2* 10.7* 10.5*  HCT 27.8* 33.3* 30.0* 29.6* 31.1* 30.5*  MCV 94.6 97.7  --  91.9 91.7 92.4  PLT 326 369  --  418* 448* 453*    Basic Metabolic Panel: Recent Labs  Lab 09/06/22 0632 09/07/22 0340 09/07/22 1141 09/08/22 0229 09/09/22 1637 09/10/22 0609  NA 139 137 132* 134* 134* 135  K 4.6 4.9 4.3 4.2 4.6 3.9  CL 102 99  --  98 100 98  CO2 28 27  --  26 27 27   GLUCOSE 121* 75  --   82 116* 84  BUN 14 10  --  13 15 12   CREATININE 0.59* 0.43*  --  0.56* 0.63 0.47*  CALCIUM 8.0* 8.6*  --  8.3* 8.3* 8.5*  MG 1.7 1.6*  --  2.0 1.7 1.7  PHOS 3.1 3.6  --  3.5 3.4 3.1    GFR: Estimated Creatinine Clearance: 72.4 mL/min (Janis Cuffe) (by C-G formula based on SCr of 0.47 mg/dL (L)).  Liver Function Tests: Recent Labs  Lab 09/07/22 0340 09/09/22 1637 09/10/22 0609  AST 62* 30 30  ALT 49* 32 31  ALKPHOS 60 56 52  BILITOT 0.3 0.4 0.7  PROT 6.4* 6.6 6.8  ALBUMIN 2.6* 2.7* 2.8*    CBG: Recent Labs  Lab 09/09/22 1145 09/09/22 2125 09/10/22 0111 09/10/22 0812 09/10/22 1254  GLUCAP 112* 103* 93 84 92     Recent Results (from the past 240 hour(s))  MRSA Next Gen  by PCR, Nasal     Status: None   Collection Time: 09/02/22  8:05 PM   Specimen: Nasal Mucosa; Nasal Swab  Result Value Ref Range Status   MRSA by PCR Next Gen NOT DETECTED NOT DETECTED Final    Comment: (NOTE) The GeneXpert MRSA Assay (FDA approved for NASAL specimens only), is one component of Azula Zappia comprehensive MRSA colonization surveillance program. It is not intended to diagnose MRSA infection nor to guide or monitor treatment for MRSA infections. Test performance is not FDA approved in patients less than 19 years old. Performed at [facility], [address], [place], [place] [address]   Culture, blood (Routine X 2) w Reflex to ID Panel     Status: None   Collection Time: 09/02/22  9:03 PM   Specimen: BLOOD LEFT HAND  Result Value Ref Range Status   Specimen Description BLOOD LEFT HAND  Final   Special Requests   Final    BOTTLES DRAWN AEROBIC AND ANAEROBIC Blood Culture results may not be optimal due to an excessive volume of blood received in culture bottles   Culture   Final    NO GROWTH 5 DAYS Performed at [facility], [address], [place], [place] [address]    Report Status 09/07/2022 FINAL  Final  Culture, blood (Routine X 2) w Reflex to ID Panel     Status: None   Collection  Time: 09/02/22  9:03 PM   Specimen: BLOOD LEFT ARM  Result Value Ref Range Status   Specimen Description BLOOD LEFT ARM  Final   Special Requests   Final    BOTTLES DRAWN AEROBIC AND ANAEROBIC Blood Culture adequate volume   Culture   Final    NO GROWTH 5 DAYS Performed at [facility], [address], [place], [place] [address]    Report Status 09/07/2022 FINAL  Final  Respiratory (~20 pathogens) panel by PCR     Status: None   Collection Time: 09/03/22 12:18 AM   Specimen: Nasopharyngeal Swab; Respiratory  Result Value Ref Range Status   Adenovirus NOT DETECTED NOT DETECTED Final   Coronavirus 229E NOT DETECTED NOT DETECTED Final    Comment: (NOTE) The Coronavirus on the Respiratory Panel, DOES NOT test for the novel  Coronavirus (2019 nCoV)    Coronavirus HKU1 NOT DETECTED NOT DETECTED Final   Coronavirus NL63 NOT DETECTED NOT DETECTED Final   Coronavirus OC43 NOT DETECTED NOT DETECTED Final   Metapneumovirus NOT DETECTED NOT DETECTED Final   Rhinovirus / Enterovirus NOT DETECTED NOT DETECTED Final   Influenza Sia Gabrielsen NOT DETECTED NOT DETECTED Final   Influenza B NOT DETECTED NOT DETECTED Final   Parainfluenza Virus 1 NOT DETECTED NOT DETECTED Final   Parainfluenza Virus 2 NOT DETECTED NOT DETECTED Final   Parainfluenza Virus 3 NOT DETECTED NOT DETECTED Final   Parainfluenza Virus 4 NOT DETECTED NOT DETECTED Final   Respiratory Syncytial Virus NOT DETECTED NOT DETECTED Final   Bordetella pertussis NOT DETECTED NOT DETECTED Final   Bordetella Parapertussis NOT DETECTED NOT DETECTED Final   Chlamydophila pneumoniae NOT DETECTED NOT DETECTED Final   Mycoplasma pneumoniae NOT DETECTED NOT DETECTED Final    Comment: Performed at [facility], [address] [place], [place] [address]         Radiology Studies: DG CHEST PORT 1 VIEW  Result Date: 09/10/2022 CLINICAL DATA:  Cough EXAM: PORTABLE CHEST 1 VIEW COMPARISON:  08/16/2022 FINDINGS: Interval removal of  endotracheal  and enteric tubes. Heart size is normal. Aortic atherosclerosis. Improved aeration of the lung bases with persistently coarsened interstitial markings. No new airspace consolidation. No pleural effusion or pneumothorax. IMPRESSION: Improved aeration of the lung bases with persistently coarsened interstitial markings. No new airspace consolidation. Electronically Signed   By: Duanne Guess D.O.   On: 09/10/2022 14:53        Scheduled Meds:  arformoterol  15 mcg Nebulization BID   budesonide (PULMICORT) nebulizer solution  0.5 mg Nebulization BID   Chlorhexidine Gluconate Cloth  6 each Topical Q0600   docusate sodium  100 mg Oral BID   enoxaparin (LOVENOX) injection  40 mg Subcutaneous Q24H   feeding supplement (PROSource TF20)  60 mL Per Tube Daily   [START ON 09/11/2022] folic acid  1 mg Oral Daily   ipratropium-albuterol  3 mL Nebulization BID   methylPREDNISolone (SOLU-MEDROL) injection  40 mg Intravenous Daily   [START ON 09/11/2022] multivitamin with minerals  1 tablet Oral Daily   PHENObarbital  32.5 mg Intravenous QHS   [START ON 09/11/2022] polyethylene glycol  17 g Oral Daily   revefenacin  175 mcg Nebulization Daily   senna  1 tablet Oral BID   sodium chloride flush  3 mL Intravenous Q12H   [START ON 09/17/2022] thiamine  100 mg Oral Daily   Continuous Infusions:  sodium chloride 10 mL/hr at 09/10/22 1100   sodium chloride     feeding supplement (OSMOLITE 1.5 CAL) Stopped (09/09/22 1345)   thiamine (VITAMIN B1) injection 500 mg (09/10/22 1401)   Followed by   Melene Muller ON 09/12/2022] thiamine (VITAMIN B1) injection       LOS: 8 days    Time spent: over 30 min    Lacretia Nicks, MD [facility]   To contact the attending provider between 7A-7P or the covering provider during after hours 7P-7A, please log into the web site [email or web address] and access using universal [place] password for that web site. If you do not have the password, please call  the hospital operator.  09/10/2022, 3:46 PM

## 2022-09-10 NOTE — Care Management Important Message (Signed)
Important Message  Patient Details  Name: Ronald [name] MRN: [id number] Date of Birth: 10/15/1955   Medicare Important Message Given:  Yes     Renie Ora 09/10/2022, 10:29 AM

## 2022-09-10 NOTE — Progress Notes (Addendum)
Speech Language Pathology Treatment: Dysphagia  Patient Details Name: [name] MRN: [id number] DOB: [birth year] Today's Date: 09/10/2022 Time: 5809-9833 SLP Time Calculation (min) (ACUTE ONLY): 20 min  Assessment / Plan / Recommendation Clinical Impression  Pt seen at bedside for skilled ST intervention targeting goals for tolerance of least restrictive diet. Pt pulled Cortrak 09/09/22. SLP provided trials of Dys2 solids and thin liquids. Pt reports no difficulty with PO intake. Intermittent cough noted before PO trials that pt attributes to smoking, however, pt is at increased risk of aspiration given COPD.  Pt exhibited s/s aspiration given dys2 solids and thin liquids at lunch. Given increased risk of aspiration, MBS was encouraged to objectively reassess swallow. Pt declined MBS more than once, stating he "already had all that done, and I'm just fine, and I aint got time for that." (Pt underwent MBS in July 2020, with recommendation for Dys3 solids and thin liquids). Pt accepted trials of nectar thick liquid, with no immediate cough response. Several minutes later pt began coughing. This delay raises suspicion for esophageal issues, which pt denies having. Pt was agreeable to changing liquids to nectar thick. SLP will continue to follow to assess diet tolerance and need for instrumental study.  SLP will follow up next week to determine if advancing diet is appropriate, or if he is willing to participate in MBS. Safe swallow precautions posted at [facility].    HPI HPI: Patient is aa 66 y.o. male with PMH: COPD, daily smoker, alcohol abuse, left BKA who presented to the hospital on 09/02/22 with dyspnea, increased WOB and wheezing. In ER, his SpO2 was in 80%'s  and he was started on BiPAP but ultimately intubated due to increased respiratory distress. Chest radiograph did not show opacities or infiltrates, VBG showed pH of 7/269 and pCO2 of 72. Patient extubated on 09/06/22.      SLP Plan   Continue with current plan of care      Recommendations for follow up therapy are one component of a multi-disciplinary discharge planning process, led by the attending physician.  Recommendations may be updated based on patient status, additional functional criteria and insurance authorization.    Recommendations  Diet recommendations: Dysphagia 2 (fine chop); Nectar thick liquid Liquids provided via: Cup;Straw Medication Administration: Whole meds with puree or NTL Supervision: Full supervision/cueing for compensatory strategies;Staff to assist with self feeding;Patient able to self feed Compensations: Minimize environmental distractions;Slow rate;Small sips/bites Postural Changes and/or Swallow Maneuvers: Seated upright 90 degrees;Upright 30-60 min after meal                Oral Care Recommendations: Oral care QID;Staff/trained caregiver to provide oral care Follow Up Recommendations: Follow physician's recommendations for discharge plan and follow up therapies Assistance recommended at discharge: Intermittent Supervision/Assistance SLP Visit Diagnosis: Dysphagia, unspecified (R13.10) Plan: Continue with current plan of care          Esbeidy Mclaine B. Murvin Natal, [facility], CCC-SLP Speech Language Pathologist Office: [phone]  Leigh Aurora 09/10/2022, 12:50 PM

## 2022-09-11 ENCOUNTER — Inpatient Hospital Stay (HOSPITAL_COMMUNITY): Payer: Medicare Other

## 2022-09-11 DIAGNOSIS — R0602 Shortness of breath: Secondary | ICD-10-CM | POA: Diagnosis not present

## 2022-09-11 DIAGNOSIS — J189 Pneumonia, unspecified organism: Secondary | ICD-10-CM | POA: Diagnosis not present

## 2022-09-11 DIAGNOSIS — J9600 Acute respiratory failure, unspecified whether with hypoxia or hypercapnia: Secondary | ICD-10-CM | POA: Diagnosis not present

## 2022-09-11 LAB — COMPREHENSIVE METABOLIC PANEL
ALT: 27 U/L (ref 0–44)
AST: 25 U/L (ref 15–41)
Albumin: 2.8 g/dL — ABNORMAL LOW (ref 3.5–5.0)
Alkaline Phosphatase: 52 U/L (ref 38–126)
Anion gap: 8 (ref 5–15)
BUN: 17 mg/dL (ref 8–23)
CO2: 27 mmol/L (ref 22–32)
Calcium: 8.3 mg/dL — ABNORMAL LOW (ref 8.9–10.3)
Chloride: 99 mmol/L (ref 98–111)
Creatinine, Ser: 0.65 mg/dL (ref 0.61–1.24)
GFR, Estimated: >60 mL/min (ref >60)
Glucose, Bld: 96 mg/dL (ref 70–99)
Potassium: 3.7 mmol/L (ref 3.5–5.1)
Sodium: 134 mmol/L — ABNORMAL LOW (ref 135–145)
Total Bilirubin: 0.4 mg/dL (ref 0.3–1.2)
Total Protein: 6.1 g/dL — ABNORMAL LOW (ref 6.5–8.1)

## 2022-09-11 LAB — CBC WITH DIFFERENTIAL/PLATELET
Abs Immature Granulocytes: 0.04 10*3/uL (ref 0.00–0.07)
Basophils Absolute: 0 10*3/uL (ref 0.0–0.1)
Basophils Relative: 0 %
Eosinophils Absolute: 0.2 10*3/uL (ref 0.0–0.5)
Eosinophils Relative: 3 %
HCT: 27.9 % — ABNORMAL LOW (ref 39.0–52.0)
Hemoglobin: 9.8 g/dL — ABNORMAL LOW (ref 13.0–17.0)
Immature Granulocytes: 1 %
Lymphocytes Relative: 22 %
Lymphs Abs: 1.7 10*3/uL (ref 0.7–4.0)
MCH: 32.3 pg (ref 26.0–34.0)
MCHC: 35.1 g/dL (ref 30.0–36.0)
MCV: 92.1 fL (ref 80.0–100.0)
Monocytes Absolute: 0.6 10*3/uL (ref 0.1–1.0)
Monocytes Relative: 8 %
Neutro Abs: 5.3 10*3/uL (ref 1.7–7.7)
Neutrophils Relative %: 66 %
Platelets: 427 10*3/uL — ABNORMAL HIGH (ref 150–400)
RBC: 3.03 MIL/uL — ABNORMAL LOW (ref 4.22–5.81)
RDW: 13.1 % (ref 11.5–15.5)
WBC: 7.9 10*3/uL (ref 4.0–10.5)
nRBC: 0 % (ref 0.0–0.2)

## 2022-09-11 LAB — GLUCOSE, CAPILLARY
Glucose-Capillary: 76 mg/dL (ref 70–99)
Glucose-Capillary: 92 mg/dL (ref 70–99)
Glucose-Capillary: 183 mg/dL — ABNORMAL HIGH (ref 70–99)
Glucose-Capillary: 155 mg/dL — ABNORMAL HIGH (ref 70–99)

## 2022-09-11 LAB — PHOSPHORUS: Phosphorus: 3.1 mg/dL (ref 2.5–4.6)

## 2022-09-11 LAB — MAGNESIUM: Magnesium: 1.8 mg/dL (ref 1.7–2.4)

## 2022-09-11 NOTE — Progress Notes (Signed)
Speech Language Pathology Treatment: Dysphagia  Patient Details Name: Ronald [name] MRN: [id number] DOB: Oct 20, 1955 Today's Date: 09/11/2022 Time: 1415-1440 SLP Time Calculation (min) (ACUTE ONLY): 25 min  Assessment / Plan / Recommendation Clinical Impression  Patient seen by SLP for skilled treatment focused on dysphagia goals. Patient was awake and alert, telling SLP that he had not had lunch. He is significantly improved overall as compared to when this SLP worked with him almost a week ago. He was on room air when SLP arrived but when patient later asked about this, he started looking in bed and when SLP helped him, we found that his nasal cannula was caught on his arm. SLP fixed this and replaced nasal cannula. Patient denied any swallowing difficulties and when SLP asking him about the thickened liquids he replied, "they ain't that thick!" As patient was hungry, SLP retrieved a soft bread/deli meat sandwich and after helping with setup, patient able to feed himself. In addition, SLP observed patient with thin liquids (cola). Patient exhibited occasional instances of coughing but this was not consistently occurring during PO intake. His swallow initiation appeared mildly delayed but no other overt s/s aspiration, penetration or oral phase difficulties observed. Patient reporting that the cough was due to the air not circulating in room. Of note, SLP who saw patient previous date was questioning an esophageal component to his cough. SLP reviewed report from most recent Esophagram completed-06/09/2022 which reported "slight delay in swallowing initiation; no vestibular penetration or aspiration seen" and as far as esophageal phase, "mild to moderate dysmotility with tertiary contractions, contrast stasis/to and fro contrast movement." At this time, SLP is recommending to upgrade patient's diet to Dys 3 (mechanical soft) solids and thin liquids. SLP will continue to follow for toleration.   HPI HPI:  Patient is aa 66 y.o. male with PMH: COPD, daily smoker, alcohol abuse, left BKA who presented to the hospital on 09/02/22 with dyspnea, increased WOB and wheezing. In ER, his SpO2 was in 80%'s  and he was started on BiPAP but ultimately intubated due to increased respiratory distress. Chest radiograph did not show opacities or infiltrates, VBG showed pH of 7/269 and pCO2 of 72. Patient extubated on 09/06/22.      SLP Plan  Continue with current plan of care      Recommendations for follow up therapy are one component of a multi-disciplinary discharge planning process, led by the attending physician.  Recommendations may be updated based on patient status, additional functional criteria and insurance authorization.    Recommendations  Diet recommendations: Dysphagia 3 (mechanical soft);Thin liquid Liquids provided via: Cup;Straw Medication Administration: Whole meds with puree Supervision: Full supervision/cueing for compensatory strategies;Patient able to self feed Compensations: Minimize environmental distractions;Slow rate;Small sips/bites Postural Changes and/or Swallow Maneuvers: Seated upright 90 degrees;Upright 30-60 min after meal                Oral Care Recommendations: Staff/trained caregiver to provide oral care;Oral care BID Follow Up Recommendations: Follow physician's recommendations for discharge plan and follow up therapies Assistance recommended at discharge: Intermittent Supervision/Assistance SLP Visit Diagnosis: Dysphagia, unspecified (R13.10) Plan: Continue with current plan of care           Angela Nevin, MA, CCC-SLP Speech Therapy

## 2022-09-11 NOTE — Progress Notes (Signed)
PROGRESS NOTE    [name]  PX:[id number] DOB: 08/08/1956 DOA: 09/02/2022 PCP: Demetrios Isaacs, FNP  Chief Complaint  Patient presents with   Altered Mental Status    Brief Narrative:  Ronald [name] is Ronald [name] 66 y.o. Male with Ronald [name] PMH of COPD, ETOH and substance abuse, Left BKA, Smoker, who presented to the ED with hypoxia. Per family, pt was supposed to have [facility] CXR completed today per PCP, but it was closed and when the patient's daughter picked him up, she noticed his WOB was increasing worse than normal. On arrival, he was noted to be disoriented to date and having audible wheezing, SHOB, and cough with Spo2 of 87%.    EDP concerned for alcohol withdrawal seizure but was too somnolent for sedation without requiring intubation. Therefore, pt was started on BiPAP and then intubated d/t worsening respiratory status. ED workup reveled Na 129, Chloride 86, WBC 14.7. VBG 7.269/72.4/35/33.2. CXR with no acute findings.   PCCM consulted for management of respiratory failure and mechanical ventilation.   Significant Events 12/21 Admitted, Failed BiPAP > Intubated, CT Angio Ordered, azithromycin and rocephin started  12/23 failed weaning attempt 12/24 not able to wean 12/24 weaning today 12/25-extubated this morning   Assessment & Plan:   Principal Problem:   Acute respiratory failure ([place]) Active Problems:   Hyponatremia   ETOH abuse   COPD (chronic obstructive pulmonary disease) (HCC)   Acute hypercapnic respiratory failure (HCC)   Right bundle branch block   Acute encephalopathy   Hx of BKA, left (HCC)   Hypotension   Protein-calorie malnutrition, severe  Acute on chronic hypoxemic and hypercapneic respiratory failure COPD Exacerbation Continue steroids, scheduled nebs, prn nebs.  Will transition to oral steroids. 12/21-12/25 ceftriaxone.  S/p 3 doses of azithromycin. CXR with improved aeration of the lung bases with coarsened interstitial markings  Acute Metabolic  Encephalopathy Concern for Seizure Like Activity EEG with moderate to severe diffuse encephalopathy Suspect related to etoh withdrawal, hypercarbia, ICU delirium, ? Etoh dementia High dose thiamine  Follow b12, folate, TSH -> wnl Ammonia is wnl. Head CT 12/21 without acute intracranial abnormality Delirium precautions Additional w/u if does not continue to improve - he's been gradually improving  EtOH dependence Phenobarbital taper CIWA  Encourage cessation   Dysphagia  Hypoglycemia Cortrak was in place, he pulled this out 12/28 SLP recommending dysphagia 2, nectar thick liquid Noted intermittent cough, si/sx aspiration -> he declined MBS with SLP Will continue to monitor off tube feeds A1c 5.6, trend BG's  Severe protein calorie malnutrition POA Follow PO intake, leave cortrak out   Hx PVD Doesn't appear to be on any meds Follow outpatient    Hypertension Hold amlodipine and lisinopril for now    DVT prophylaxis: lovenox Code Status: full Family Communication: sisters at bedside Disposition:   Status is: Inpatient Remains inpatient appropriate because: continued encephalopathy   Consultants:  PCCM  Procedures:  Echo IMPRESSIONS     1. Left ventricular ejection fraction, by estimation, is 65 to 70%. The  left ventricle has normal function. The left ventricle has no regional  wall motion abnormalities. There is mild left ventricular hypertrophy.  Left ventricular diastolic parameters  are consistent with Grade I diastolic dysfunction (impaired relaxation).   2. Right ventricular systolic function is normal. The right ventricular  size is normal. There is normal pulmonary artery systolic pressure.   3. Trivial mitral valve regurgitation.   4. Aortic valve regurgitation is not visualized.   5. Aneurysm of the  ascending aorta, measuring 41 mm.   6. The inferior vena cava is dilated in size with <50% respiratory  variability, suggesting right atrial pressure  of 15 mmHg.   Antimicrobials:  Anti-infectives (From admission, onward)    Start     Dose/Rate Route Frequency Ordered Stop   09/02/22 1800  azithromycin (ZITHROMAX) 500 mg in sodium chloride 0.9 % 250 mL IVPB  Status:  Discontinued        500 mg 250 mL/hr over 60 Minutes Intravenous Every 24 hours 09/02/22 1755 09/05/22 1213   09/02/22 1800  cefTRIAXone (ROCEPHIN) 2 g in sodium chloride 0.9 % 100 mL IVPB        2 g 200 mL/hr over 30 Minutes Intravenous Every 24 hours 09/02/22 1756 09/06/22 1832       Subjective: No complaints  Objective: Vitals:   09/11/22 0723 09/11/22 0816 09/11/22 1156 09/11/22 1603  BP:   123/81 139/78  Pulse: 90 94 98 97  Resp: 18 18 18 18   Temp:  98 F (36.7 C) 98 F (36.7 C) 98 F (36.7 C)  TempSrc:  Oral Oral Oral  SpO2: 98% 98%  97%  Weight:      Height:        Intake/Output Summary (Last 24 hours) at 09/11/2022 1720 Last data filed at 09/11/2022 1614 Gross per 24 hour  Intake 750 ml  Output 780 ml  Net -30 ml   Filed Weights   09/08/22 0410 09/09/22 0500 09/10/22 0500  Weight: 54.7 kg 55.6 kg 55.6 kg    Examination:  General: No acute distress. Cardiovascular: RRR Lungs: unlabored Abdomen: Soft, nontender, nondistended Neurological: Alert and oriented 3. Moves all extremities 4 . Cranial nerves II through XII grossly intact. Extremities: L AKA, RLE with subjective pain to foot, not particularly impressive on exam, no notable erythema, edema, crepitus, fluctuance    Data Reviewed: I have personally reviewed following labs and imaging studies  CBC: Recent Labs  Lab 09/07/22 0340 09/07/22 1141 09/08/22 0229 09/09/22 1637 09/10/22 0609 09/11/22 0142  WBC 7.9  --  8.8 5.4 6.2 7.9  NEUTROABS 5.4  --   --  4.5 3.9 5.3  HGB 10.8* 10.2* 10.2* 10.7* 10.5* 9.8*  HCT 33.3* 30.0* 29.6* 31.1* 30.5* 27.9*  MCV 97.7  --  91.9 91.7 92.4 92.1  PLT 369  --  418* 448* 453* 427*    Basic Metabolic Panel: Recent Labs  Lab  09/07/22 0340 09/07/22 1141 09/08/22 0229 09/09/22 1637 09/10/22 0609 09/11/22 0142  NA 137 132* 134* 134* 135 134*  K 4.9 4.3 4.2 4.6 3.9 3.7  CL 99  --  98 100 98 99  CO2 27  --  26 27 27 27   GLUCOSE 75  --  82 116* 84 96  BUN 10  --  13 15 12 17   CREATININE 0.43*  --  0.56* 0.63 0.47* 0.65  CALCIUM 8.6*  --  8.3* 8.3* 8.5* 8.3*  MG 1.6*  --  2.0 1.7 1.7 1.8  PHOS 3.6  --  3.5 3.4 3.1 3.1    GFR: Estimated Creatinine Clearance: 72.4 mL/min (by C-G formula based on SCr of 0.65 mg/dL).  Liver Function Tests: Recent Labs  Lab 09/07/22 0340 09/09/22 1637 09/10/22 0609 09/11/22 0142  AST 62* 30 30 25   ALT 49* 32 31 27  ALKPHOS 60 56 52 52  BILITOT 0.3 0.4 0.7 0.4  PROT 6.4* 6.6 6.8 6.1*  ALBUMIN 2.6* 2.7* 2.8* 2.8*  CBG: Recent Labs  Lab 09/10/22 1800 09/10/22 2117 09/11/22 0736 09/11/22 1217 09/11/22 1638  GLUCAP 112* 100* 76 92 183*     Recent Results (from the past 240 hour(s))  MRSA Next Gen by PCR, Nasal     Status: None   Collection Time: 09/02/22  8:05 PM   Specimen: Nasal Mucosa; Nasal Swab  Result Value Ref Range Status   MRSA by PCR Next Gen NOT DETECTED NOT DETECTED Final    Comment: (NOTE) The GeneXpert MRSA Assay (FDA approved for NASAL specimens only), is one component of Ronald [name] comprehensive MRSA colonization surveillance program. It is not intended to diagnose MRSA infection nor to guide or monitor treatment for MRSA infections. Test performance is not FDA approved in patients less than 63 years old. Performed at [facility], [place] [address], [place], [place] [address]   Culture, blood (Routine X 2) w Reflex to ID Panel     Status: None   Collection Time: 09/02/22  9:03 PM   Specimen: BLOOD LEFT HAND  Result Value Ref Range Status   Specimen Description BLOOD LEFT HAND  Final   Special Requests   Final    BOTTLES DRAWN AEROBIC AND ANAEROBIC Blood Culture results may not be optimal due to an excessive volume of blood received in  culture bottles   Culture   Final    NO GROWTH 5 DAYS Performed at [facility], [place] [address], [place], [place] [address]    Report Status 09/07/2022 FINAL  Final  Culture, blood (Routine X 2) w Reflex to ID Panel     Status: None   Collection Time: 09/02/22  9:03 PM   Specimen: BLOOD LEFT ARM  Result Value Ref Range Status   Specimen Description BLOOD LEFT ARM  Final   Special Requests   Final    BOTTLES DRAWN AEROBIC AND ANAEROBIC Blood Culture adequate volume   Culture   Final    NO GROWTH 5 DAYS Performed at [facility], [place] [address] [place], [place] [address]    Report Status 09/07/2022 FINAL  Final  Respiratory (~20 pathogens) panel by PCR     Status: None   Collection Time: 09/03/22 12:18 AM   Specimen: Nasopharyngeal Swab; Respiratory  Result Value Ref Range Status   Adenovirus NOT DETECTED NOT DETECTED Final   Coronavirus 229E NOT DETECTED NOT DETECTED Final    Comment: (NOTE) The Coronavirus on the Respiratory Panel, DOES NOT test for the novel  Coronavirus (2019 nCoV)    Coronavirus HKU1 NOT DETECTED NOT DETECTED Final   Coronavirus NL63 NOT DETECTED NOT DETECTED Final   Coronavirus OC43 NOT DETECTED NOT DETECTED Final   Metapneumovirus NOT DETECTED NOT DETECTED Final   Rhinovirus / Enterovirus NOT DETECTED NOT DETECTED Final   Influenza Caydin Ye[place] NOT DETECTED NOT DETECTED Final   Influenza B NOT DETECTED NOT DETECTED Final   Parainfluenza Virus 1 NOT DETECTED NOT DETECTED Final   Parainfluenza Virus 2 NOT DETECTED NOT DETECTED Final   Parainfluenza Virus 3 NOT DETECTED NOT DETECTED Final   Parainfluenza Virus 4 NOT DETECTED NOT DETECTED Final   Respiratory Syncytial Virus NOT DETECTED NOT DETECTED Final   Bordetella pertussis NOT DETECTED NOT DETECTED Final   Bordetella Parapertussis NOT DETECTED NOT DETECTED Final   Chlamydophila pneumoniae NOT DETECTED NOT DETECTED Final   Mycoplasma pneumoniae NOT DETECTED NOT DETECTED Final    Comment: Performed  at [facility], [place]. [address], [place], [place]  [id number]         Radiology Studies: DG CHEST PORT 1 VIEW  Result Date: 09/10/2022 CLINICAL DATA:  Cough EXAM: PORTABLE CHEST 1 VIEW COMPARISON:  08/16/2022 FINDINGS: Interval removal of endotracheal and enteric tubes. Heart size is normal. Aortic atherosclerosis. Improved aeration of the lung bases with persistently coarsened interstitial markings. No new airspace consolidation. No pleural effusion or pneumothorax. IMPRESSION: Improved aeration of the lung bases with persistently coarsened interstitial markings. No new airspace consolidation. Electronically Signed   By: Davina Poke D.O.   On: 09/10/2022 14:53        Scheduled Meds:  arformoterol  15 mcg Nebulization BID   budesonide (PULMICORT) nebulizer solution  0.5 mg Nebulization BID   docusate sodium  100 mg Oral BID   enoxaparin (LOVENOX) injection  40 mg Subcutaneous [id number]   folic acid  1 mg Oral Daily   multivitamin with minerals  1 tablet Oral Daily   polyethylene glycol  17 g Oral Daily   predniSONE  30 mg Oral Q breakfast   Followed by   Derrill Memo ON 09/13/2022] predniSONE  20 mg Oral Q breakfast   Followed by   Derrill Memo ON 09/15/2022] predniSONE  10 mg Oral Q breakfast   revefenacin  175 mcg Nebulization Daily   senna  1 tablet Oral BID   sodium chloride flush  3 mL Intravenous Q12H   [START ON 09/17/2022] thiamine  100 mg Oral Daily   Continuous Infusions:  sodium chloride 10 mL/hr at 09/10/22 1700   sodium chloride     thiamine (VITAMIN B1) injection 500 mg (09/11/22 1459)   Followed by   Derrill Memo ON 09/12/2022] thiamine (VITAMIN B1) injection       LOS: 9 days    Time spent: over 30 min    Fayrene Helper, MD [facility]   To contact the attending provider between 7A-7P or the covering provider during after hours 7P-7A, please log into the web site [email or web address] and access using universal [place] password for that web site. If you do not  have the password, please call the hospital operator.  09/11/2022, 5:20 PM

## 2022-09-11 NOTE — Plan of Care (Signed)

## 2022-09-12 DIAGNOSIS — J9600 Acute respiratory failure, unspecified whether with hypoxia or hypercapnia: Secondary | ICD-10-CM | POA: Diagnosis not present

## 2022-09-12 DIAGNOSIS — R0602 Shortness of breath: Secondary | ICD-10-CM | POA: Diagnosis not present

## 2022-09-12 DIAGNOSIS — J189 Pneumonia, unspecified organism: Secondary | ICD-10-CM | POA: Diagnosis not present

## 2022-09-12 LAB — GLUCOSE, CAPILLARY
Glucose-Capillary: 106 mg/dL — ABNORMAL HIGH (ref 70–99)
Glucose-Capillary: 107 mg/dL — ABNORMAL HIGH (ref 70–99)
Glucose-Capillary: 144 mg/dL — ABNORMAL HIGH (ref 70–99)
Glucose-Capillary: 146 mg/dL — ABNORMAL HIGH (ref 70–99)

## 2022-09-12 NOTE — TOC Progression Note (Signed)
Transition of Care [facility]) - Progression Note    Patient Details  Name: [name] MRN: [id number] Date of Birth: [birth year]  Transition of Care [facility]) CM/SW Contact  Emeterio Reeve,  Phone Number: 09/12/2022, 11:58 AM  Clinical Narrative:     LCSW met with pt at bedside. CSW gave bed offers. Pt is chose [facility] for SNF. LCSW reached out to admissions coordinator and waiting for reply.   Expected Discharge Plan: [place] Barriers to Discharge: Continued Medical Work up  Expected Discharge Plan and [place] Choice: [place] arrangements for the past 2 months: Apartment                                       Social Determinants of Health (SDOH) Interventions SDOH Screenings   Tobacco Use: High Risk (04/15/2022)    Readmission Risk Interventions     No data to display         Emeterio Reeve, LCSW Clinical Social Worker

## 2022-09-12 NOTE — Progress Notes (Signed)
PROGRESS NOTE    [name]  EYC:[id number] DOB: 1956/02/11 DOA: 09/02/2022 PCP: Donato Schultz, FNP  Chief Complaint  Patient presents with   Altered Mental Status    Brief Narrative:  Ronald [name] is Ronald [name] 66 y.o. Male with Ronald [name] PMH of COPD, ETOH and substance abuse, Left BKA, Smoker, who presented to the ED with hypoxia. Per family, pt was supposed to have Ronald [name] CXR completed today per PCP, but it was closed and when the patient's daughter picked him up, she noticed his WOB was increasing worse than normal. On arrival, he was noted to be disoriented to date and having audible wheezing, SHOB, and cough with Spo2 of 87%.    EDP concerned for alcohol withdrawal seizure but was too somnolent for sedation without requiring intubation. Therefore, pt was started on BiPAP and then intubated d/t worsening respiratory status. ED workup reveled Na 129, Chloride 86, WBC 14.7. VBG 7.269/72.4/35/33.2. CXR with no acute findings.   PCCM consulted for management of respiratory failure and mechanical ventilation.   Significant Events 12/21 Admitted, Failed BiPAP > Intubated, CT Angio Ordered, azithromycin and rocephin started  12/23 failed weaning attempt 12/24 not able to wean 12/24 weaning today 12/25-extubated this morning   Assessment & Plan:   Principal Problem:   Acute respiratory failure (HCC) Active Problems:   Hyponatremia   ETOH abuse   COPD (chronic obstructive pulmonary disease) (HCC)   Acute hypercapnic respiratory failure (HCC)   Right bundle branch block   Acute encephalopathy   Hx of BKA, left (HCC)   Hypotension   Protein-calorie malnutrition, severe  Acute on chronic hypoxemic and hypercapneic respiratory failure COPD Exacerbation Continue steroids, scheduled nebs, prn nebs.  Will transition to oral steroids. 12/21-12/25 ceftriaxone.  S/p 3 doses of azithromycin. CXR with improved aeration of the lung bases with coarsened interstitial markings  Acute Metabolic  Encephalopathy Concern for Seizure Like Activity EEG with moderate to severe diffuse encephalopathy Suspect related to etoh withdrawal, hypercarbia, ICU delirium, ? Etoh dementia High dose thiamine  Follow b12, folate, TSH -> wnl Ammonia is wnl. Head CT 12/21 without acute intracranial abnormality Delirium precautions Additional w/u if does not continue to improve - he's been gradually improving  EtOH dependence Phenobarbital taper CIWA  Encourage cessation   Dysphagia  Hypoglycemia Cortrak was in place, he pulled this out 12/28 SLP recommending dysphagia 3, thin Noted intermittent cough, si/sx aspiration -> he declined MBS with SLP Will continue to monitor off tube feeds A1c 5.6, trend BG's  Acute to Subacute Nondisplaced Fx of the Proximal Metaphysis of the Proximal Phalanx of the Second Toe WBAT in hard sole shoe per orthopedics Outpatient follow up   Severe protein calorie malnutrition POA Follow PO intake, leave cortrak out   Hx PVD Doesn't appear to be on any meds Follow outpatient    Hypertension Hold amlodipine and lisinopril for now    DVT prophylaxis: lovenox Code Status: full Family Communication: sisters at bedside Disposition:   Status is: Inpatient Remains inpatient appropriate because: continued encephalopathy   Consultants:  PCCM  Procedures:  Echo IMPRESSIONS     1. Left ventricular ejection fraction, by estimation, is 65 to 70%. The  left ventricle has normal function. The left ventricle has no regional  wall motion abnormalities. There is mild left ventricular hypertrophy.  Left ventricular diastolic parameters  are consistent with Grade I diastolic dysfunction (impaired relaxation).   2. Right ventricular systolic function is normal. The right ventricular  size is normal. There is  normal pulmonary artery systolic pressure.   3. Trivial mitral valve regurgitation.   4. Aortic valve regurgitation is not visualized.   5. Aneurysm of  the ascending aorta, measuring 41 mm.   6. The inferior vena cava is dilated in size with <50% respiratory  variability, suggesting right atrial pressure of 15 mmHg.   Antimicrobials:  Anti-infectives (From admission, onward)    Start     Dose/Rate Route Frequency Ordered Stop   09/02/22 1800  azithromycin (ZITHROMAX) 500 mg in sodium chloride 0.9 % 250 mL IVPB  Status:  Discontinued        500 mg 250 mL/hr over 60 Minutes Intravenous Every 24 hours 09/02/22 1755 09/05/22 1213   09/02/22 1800  cefTRIAXone (ROCEPHIN) 2 g in sodium chloride 0.9 % 100 mL IVPB        2 g 200 mL/hr over 30 Minutes Intravenous Every 24 hours 09/02/22 1756 09/06/22 1832       Subjective: Getting restless  Objective: Vitals:   09/11/22 0723 09/11/22 0816 09/11/22 1156 09/11/22 1603  BP:   123/81 139/78  Pulse: 90 94 98 97  Resp: 18 18 18 18   Temp:  98 F (36.7 C) 98 F (36.7 C) 98 F (36.7 C)  TempSrc:  Oral Oral Oral  SpO2: 98% 98%  97%  Weight:      Height:        Intake/Output Summary (Last 24 hours) at 09/11/2022 1720 Last data filed at 09/11/2022 1614 Gross per 24 hour  Intake 750 ml  Output 780 ml  Net -30 ml   Filed Weights   09/08/22 0410 09/09/22 0500 09/10/22 0500  Weight: 54.7 kg 55.6 kg 55.6 kg    Examination:  General: No acute distress. Lungs: unlabored Neurological: Alert and oriented 3. Moves all extremities 4 with equal strength. Cranial nerves II through XII grossly intact. Extremities: L AKA   Data Reviewed: I have personally reviewed following labs and imaging studies  CBC: Recent Labs  Lab 09/07/22 0340 09/07/22 1141 09/08/22 0229 09/09/22 1637 09/10/22 0609 09/11/22 0142  WBC 7.9  --  8.8 5.4 6.2 7.9  NEUTROABS 5.4  --   --  4.5 3.9 5.3  HGB 10.8* 10.2* 10.2* 10.7* 10.5* 9.8*  HCT 33.3* 30.0* 29.6* 31.1* 30.5* 27.9*  MCV 97.7  --  91.9 91.7 92.4 92.1  PLT 369  --  418* 448* 453* 427*    Basic Metabolic Panel: Recent Labs  Lab 09/07/22 0340  09/07/22 1141 09/08/22 0229 09/09/22 1637 09/10/22 0609 09/11/22 0142  NA 137 132* 134* 134* 135 134*  K 4.9 4.3 4.2 4.6 3.9 3.7  CL 99  --  98 100 98 99  CO2 27  --  26 27 27 27   GLUCOSE 75  --  82 116* 84 96  BUN 10  --  13 15 12 17   CREATININE 0.43*  --  0.56* 0.63 0.47* 0.65  CALCIUM 8.6*  --  8.3* 8.3* 8.5* 8.3*  MG 1.6*  --  2.0 1.7 1.7 1.8  PHOS 3.6  --  3.5 3.4 3.1 3.1    GFR: Estimated Creatinine Clearance: 72.4 mL/min (by C-G formula based on SCr of 0.65 mg/dL).  Liver Function Tests: Recent Labs  Lab 09/07/22 0340 09/09/22 1637 09/10/22 0609 09/11/22 0142  AST 62* 30 30 25   ALT 49* 32 31 27  ALKPHOS 60 56 52 52  BILITOT 0.3 0.4 0.7 0.4  PROT 6.4* 6.6 6.8 6.1*  ALBUMIN 2.6* 2.7*  2.8* 2.8*    CBG: Recent Labs  Lab 09/10/22 1800 09/10/22 2117 09/11/22 0736 09/11/22 1217 09/11/22 1638  GLUCAP 112* 100* 76 92 183*     Recent Results (from the past 240 hour(s))  MRSA Next Gen by PCR, Nasal     Status: None   Collection Time: 09/02/22  8:05 PM   Specimen: Nasal Mucosa; Nasal Swab  Result Value Ref Range Status   MRSA by PCR Next Gen NOT DETECTED NOT DETECTED Final    Comment: (NOTE) The GeneXpert MRSA Assay (FDA approved for NASAL specimens only), is one component of [facility] comprehensive MRSA colonization surveillance program. It is not intended to diagnose MRSA infection nor to guide or monitor treatment for MRSA infections. Test performance is not FDA approved in patients less than 65 years old. Performed at [facility], [address] [place], [place] [address]   Culture, blood (Routine X 2) w Reflex to ID Panel     Status: None   Collection Time: 09/02/22  9:03 PM   Specimen: BLOOD LEFT HAND  Result Value Ref Range Status   Specimen Description BLOOD LEFT HAND  Final   Special Requests   Final    BOTTLES DRAWN AEROBIC AND ANAEROBIC Blood Culture results may not be optimal due to an excessive volume of blood received in culture bottles    Culture   Final    NO GROWTH 5 DAYS Performed at [facility], [address] [place], [place] [address]    Report Status 09/07/2022 FINAL  Final  Culture, blood (Routine X 2) w Reflex to ID Panel     Status: None   Collection Time: 09/02/22  9:03 PM   Specimen: BLOOD LEFT ARM  Result Value Ref Range Status   Specimen Description BLOOD LEFT ARM  Final   Special Requests   Final    BOTTLES DRAWN AEROBIC AND ANAEROBIC Blood Culture adequate volume   Culture   Final    NO GROWTH 5 DAYS Performed at [facility], [address] [place], [place] [address]    Report Status 09/07/2022 FINAL  Final  Respiratory (~20 pathogens) panel by PCR     Status: None   Collection Time: 09/03/22 12:18 AM   Specimen: Nasopharyngeal Swab; Respiratory  Result Value Ref Range Status   Adenovirus NOT DETECTED NOT DETECTED Final   Coronavirus 229E NOT DETECTED NOT DETECTED Final    Comment: (NOTE) The Coronavirus on the Respiratory Panel, DOES NOT test for the novel  Coronavirus (2019 nCoV)    Coronavirus HKU1 NOT DETECTED NOT DETECTED Final   Coronavirus NL63 NOT DETECTED NOT DETECTED Final   Coronavirus OC43 NOT DETECTED NOT DETECTED Final   Metapneumovirus NOT DETECTED NOT DETECTED Final   Rhinovirus / Enterovirus NOT DETECTED NOT DETECTED Final   Influenza Mykale Gandolfo NOT DETECTED NOT DETECTED Final   Influenza B NOT DETECTED NOT DETECTED Final   Parainfluenza Virus 1 NOT DETECTED NOT DETECTED Final   Parainfluenza Virus 2 NOT DETECTED NOT DETECTED Final   Parainfluenza Virus 3 NOT DETECTED NOT DETECTED Final   Parainfluenza Virus 4 NOT DETECTED NOT DETECTED Final   Respiratory Syncytial Virus NOT DETECTED NOT DETECTED Final   Bordetella pertussis NOT DETECTED NOT DETECTED Final   Bordetella Parapertussis NOT DETECTED NOT DETECTED Final   Chlamydophila pneumoniae NOT DETECTED NOT DETECTED Final   Mycoplasma pneumoniae NOT DETECTED NOT DETECTED Final    Comment: Performed at [facility], [address] [place], [place] [address]         Radiology Studies: DG CHEST PORT 1 VIEW  Result Date: 09/10/2022 CLINICAL DATA:  Cough EXAM: PORTABLE CHEST 1 VIEW COMPARISON:  08/16/2022 FINDINGS: Interval removal of endotracheal and enteric tubes. Heart size is normal. Aortic atherosclerosis. Improved aeration of the lung bases with persistently coarsened interstitial markings. No new airspace consolidation. No pleural effusion or pneumothorax. IMPRESSION: Improved aeration of the lung bases with persistently coarsened interstitial markings. No new airspace consolidation. Electronically Signed   By: Duanne Guess D.O.   On: 09/10/2022 14:53        Scheduled Meds:  arformoterol  15 mcg Nebulization BID   budesonide (PULMICORT) nebulizer solution  0.5 mg Nebulization BID   docusate sodium  100 mg Oral BID   enoxaparin (LOVENOX) injection  40 mg Subcutaneous Q24H   folic acid  1 mg Oral Daily   multivitamin with minerals  1 tablet Oral Daily   polyethylene glycol  17 g Oral Daily   predniSONE  30 mg Oral Q breakfast   Followed by   Melene Muller ON 09/13/2022] predniSONE  20 mg Oral Q breakfast   Followed by   Melene Muller ON 09/15/2022] predniSONE  10 mg Oral Q breakfast   revefenacin  175 mcg Nebulization Daily   senna  1 tablet Oral BID   sodium chloride flush  3 mL Intravenous Q12H   [START ON 09/17/2022] thiamine  100 mg Oral Daily   Continuous Infusions:  sodium chloride 10 mL/hr at 09/10/22 1700   sodium chloride     thiamine (VITAMIN B1) injection 500 mg (09/11/22 1459)   Followed by   Melene Muller ON 09/12/2022] thiamine (VITAMIN B1) injection       LOS: 9 days    Time spent: over 30 min    Lacretia Nicks, MD [facility]   To contact the attending provider between 7A-7P or the covering provider during after hours 7P-7A, please log into the web site [email or web address] and access using universal [place] password for that web site. If you do not have the  password, please call the hospital operator.  09/11/2022, 5:20 PM

## 2022-09-12 NOTE — Progress Notes (Signed)
Speech Language Pathology Treatment: Dysphagia  Patient Details Name: [name] MRN: [id number] DOB: May 24, 1956 Today's Date: 09/12/2022 Time: 6962-9528 SLP Time Calculation (min) (ACUTE ONLY): 15 min  Assessment / Plan / Recommendation Clinical Impression  Patient seen by SLP for skilled treatment focused on dysphagia goals. Patient appeared improved cognitively, telling SLP (confirmed from medical chart) that he would be leaving tomorrow for [employer] (SNF). He also remembered that this SLP said he would bring him candy today. SLP observed patient with PO intake of part of a thin chocolate bar and water. Of note, today patient is on RA but telemetry not connected/operational at this time. Patient did not exhibit any overt s/s of aspiration or penetration as he has in previous visits. He appears to be tolerating PO upgrade from yesterday (Dys 3 solids, thin liquids) and recommend he remain on this. SLP will follow patient while here in hospital.   HPI HPI: Patient is aa 66 y.o. male with PMH: COPD, daily smoker, alcohol abuse, left BKA who presented to the hospital on 09/02/22 with dyspnea, increased WOB and wheezing. In ER, his SpO2 was in 80%'s  and he was started on BiPAP but ultimately intubated due to increased respiratory distress. Chest radiograph did not show opacities or infiltrates, VBG showed pH of 7/269 and pCO2 of 72. Patient extubated on 09/06/22.      SLP Plan  Continue with current plan of care      Recommendations for follow up therapy are one component of a multi-disciplinary discharge planning process, led by the attending physician.  Recommendations may be updated based on patient status, additional functional criteria and insurance authorization.    Recommendations  Diet recommendations: Dysphagia 3 (mechanical soft);Thin liquid Liquids provided via: Cup;Straw Medication Administration: Whole meds with puree Supervision: Patient able to self  feed;Intermittent supervision to cue for compensatory strategies Compensations: Minimize environmental distractions;Slow rate;Small sips/bites Postural Changes and/or Swallow Maneuvers: Seated upright 90 degrees;Upright 30-60 min after meal                Oral Care Recommendations: Staff/trained caregiver to provide oral care;Oral care BID Follow Up Recommendations: Follow physician's recommendations for discharge plan and follow up therapies Assistance recommended at discharge: Intermittent Supervision/Assistance SLP Visit Diagnosis: Dysphagia, unspecified (R13.10) Plan: Continue with current plan of care          Angela Nevin, MA, CCC-SLP Speech Therapy

## 2022-09-12 NOTE — Plan of Care (Signed)

## 2022-09-13 DIAGNOSIS — J9600 Acute respiratory failure, unspecified whether with hypoxia or hypercapnia: Secondary | ICD-10-CM | POA: Diagnosis not present

## 2022-09-13 LAB — GLUCOSE, CAPILLARY
Glucose-Capillary: 112 mg/dL — ABNORMAL HIGH (ref 70–99)
Glucose-Capillary: 126 mg/dL — ABNORMAL HIGH (ref 70–99)
Glucose-Capillary: 135 mg/dL — ABNORMAL HIGH (ref 70–99)
Glucose-Capillary: 124 mg/dL — ABNORMAL HIGH (ref 70–99)

## 2022-09-13 NOTE — Progress Notes (Signed)
PROGRESS NOTE    [name]  ZI:[id number] DOB: Jul 20, 1956 DOA: 09/02/2022 PCP: Demetrios Isaacs, FNP  Chief Complaint  Patient presents with   Altered Mental Status    Brief Narrative:  Ronald [name] is Ronald [name] 67 y.o. Male with Sarahgrace Broman PMH of COPD, ETOH and substance abuse, Left BKA, Smoker, who presented to the ED with hypoxia. Per family, pt was supposed to have Jaydee Ing[facility] CXR completed today per PCP, but it was closed and when the patient's daughter picked him up, she noticed his WOB was increasing worse than normal. On arrival, he was noted to be disoriented to date and having audible wheezing, SHOB, and cough with Spo2 of 87%.    EDP concerned for alcohol withdrawal seizure but was too somnolent for sedation without requiring intubation. Therefore, pt was started on BiPAP and then intubated d/t worsening respiratory status. ED workup reveled Na 129, Chloride 86, WBC 14.7. VBG 7.269/72.4/35/33.2. CXR with no acute findings.   PCCM consulted for management of respiratory failure and mechanical ventilation.   Significant Events 12/21 Admitted, Failed BiPAP > Intubated, CT Angio Ordered, azithromycin and rocephin started  12/23 failed weaning attempt 12/24 not able to wean 12/24 weaning today 12/25-extubated this morning   Assessment & Plan:   Principal Problem:   Acute respiratory failure ([place]) Active Problems:   Hyponatremia   ETOH abuse   COPD (chronic obstructive pulmonary disease) (HCC)   Acute hypercapnic respiratory failure (HCC)   Right bundle branch block   Acute encephalopathy   Hx of BKA, left (HCC)   Hypotension   Protein-calorie malnutrition, severe  Acute on chronic hypoxemic and hypercapneic respiratory failure COPD Exacerbation Continue steroids, scheduled nebs, prn nebs.  Will transition to oral steroids. 12/21-12/25 ceftriaxone.  S/p 3 doses of azithromycin. CXR with improved aeration of the lung bases with coarsened interstitial markings  Acute Metabolic  Encephalopathy Concern for Seizure Like Activity EEG with moderate to severe diffuse encephalopathy Suspect related to etoh withdrawal, hypercarbia, ICU delirium, ? Etoh dementia High dose thiamine  Follow b12, folate, TSH -> wnl Ammonia is wnl. Head CT 12/21 without acute intracranial abnormality Delirium precautions Additional w/u if does not continue to improve - he's been gradually improving  EtOH dependence Phenobarbital taper complete CIWA  Encourage cessation   Dysphagia  Hypoglycemia Cortrak was in place, he pulled this out 12/28 SLP recommending dysphagia 3, thin Noted intermittent cough, si/sx aspiration -> he declined MBS with SLP Will continue to monitor off tube feeds A1c 5.6, trend BG's  Acute to Subacute Nondisplaced Fx of the Proximal Metaphysis of the Proximal Phalanx of the Second Toe WBAT in hard sole shoe per orthopedics Outpatient follow up   Severe protein calorie malnutrition POA Follow PO intake, leave cortrak out   Hx PVD Doesn't appear to be on any meds Follow outpatient    Hypertension Hold amlodipine and lisinopril for now    DVT prophylaxis: lovenox Code Status: full Family Communication: sisters at bedside Disposition:   Status is: Inpatient Remains inpatient appropriate because: continued encephalopathy   Consultants:  PCCM  Procedures:  Echo IMPRESSIONS     1. Left ventricular ejection fraction, by estimation, is 65 to 70%. The  left ventricle has normal function. The left ventricle has no regional  wall motion abnormalities. There is mild left ventricular hypertrophy.  Left ventricular diastolic parameters  are consistent with Grade I diastolic dysfunction (impaired relaxation).   2. Right ventricular systolic function is normal. The right ventricular  size is normal. There  is normal pulmonary artery systolic pressure.   3. Trivial mitral valve regurgitation.   4. Aortic valve regurgitation is not visualized.   5.  Aneurysm of the ascending aorta, measuring 41 mm.   6. The inferior vena cava is dilated in size with <50% respiratory  variability, suggesting right atrial pressure of 15 mmHg.   Antimicrobials:  Anti-infectives (From admission, onward)    Start     Dose/Rate Route Frequency Ordered Stop   09/02/22 1800  azithromycin (ZITHROMAX) 500 mg in sodium chloride 0.9 % 250 mL IVPB  Status:  Discontinued        500 mg 250 mL/hr over 60 Minutes Intravenous Every 24 hours 09/02/22 1755 09/05/22 1213   09/02/22 1800  cefTRIAXone (ROCEPHIN) 2 g in sodium chloride 0.9 % 100 mL IVPB        2 g 200 mL/hr over 30 Minutes Intravenous Every 24 hours 09/02/22 1756 09/06/22 1832       Subjective: No complaints  Objective: Vitals:   09/11/22 0723 09/11/22 0816 09/11/22 1156 09/11/22 1603  BP:   123/81 139/78  Pulse: 90 94 98 97  Resp: 18 18 18 18   Temp:  98 F (36.7 C) 98 F (36.7 C) 98 F (36.7 C)  TempSrc:  Oral Oral Oral  SpO2: 98% 98%  97%  Weight:      Height:        Intake/Output Summary (Last 24 hours) at 09/11/2022 1720 Last data filed at 09/11/2022 1614 Gross per 24 hour  Intake 750 ml  Output 780 ml  Net -30 ml   Filed Weights   09/08/22 0410 09/09/22 0500 09/10/22 0500  Weight: 54.7 kg 55.6 kg 55.6 kg    Examination:  General: No acute distress. Cardiovascular: RRR Lungs: unlabored Abdomen: Soft, nontender, nondistended Neurological: Alert and oriented 3. Moves all extremities 4 with equal strength. Cranial nerves II through XII grossly intact. Extremities: L AKA, swelling to R 2nd toe    Data Reviewed: I have personally reviewed following labs and imaging studies  CBC: Recent Labs  Lab 09/07/22 0340 09/07/22 1141 09/08/22 0229 09/09/22 1637 09/10/22 0609 09/11/22 0142  WBC 7.9  --  8.8 5.4 6.2 7.9  NEUTROABS 5.4  --   --  4.5 3.9 5.3  HGB 10.8* 10.2* 10.2* 10.7* 10.5* 9.8*  HCT 33.3* 30.0* 29.6* 31.1* 30.5* 27.9*  MCV 97.7  --  91.9 91.7 92.4 92.1   PLT 369  --  418* 448* 453* 427*    Basic Metabolic Panel: Recent Labs  Lab 09/07/22 0340 09/07/22 1141 09/08/22 0229 09/09/22 1637 09/10/22 0609 09/11/22 0142  NA 137 132* 134* 134* 135 134*  K 4.9 4.3 4.2 4.6 3.9 3.7  CL 99  --  98 100 98 99  CO2 27  --  26 27 27 27   GLUCOSE 75  --  82 116* 84 96  BUN 10  --  13 15 12 17   CREATININE 0.43*  --  0.56* 0.63 0.47* 0.65  CALCIUM 8.6*  --  8.3* 8.3* 8.5* 8.3*  MG 1.6*  --  2.0 1.7 1.7 1.8  PHOS 3.6  --  3.5 3.4 3.1 3.1    GFR: Estimated Creatinine Clearance: 72.4 mL/min (by C-G formula based on SCr of 0.65 mg/dL).  Liver Function Tests: Recent Labs  Lab 09/07/22 0340 09/09/22 1637 09/10/22 0609 09/11/22 0142  AST 62* 30 30 25   ALT 49* 32 31 27  ALKPHOS 60 56 52 52  BILITOT 0.3  0.4 0.7 0.4  PROT 6.4* 6.6 6.8 6.1*  ALBUMIN 2.6* 2.7* 2.8* 2.8*    CBG: Recent Labs  Lab 09/10/22 1800 09/10/22 2117 09/11/22 0736 09/11/22 1217 09/11/22 1638  GLUCAP 112* 100* 76 92 183*     Recent Results (from the past 240 hour(s))  MRSA Next Gen by PCR, Nasal     Status: None   Collection Time: 09/02/22  8:05 PM   Specimen: Nasal Mucosa; Nasal Swab  Result Value Ref Range Status   MRSA by PCR Next Gen NOT DETECTED NOT DETECTED Final    Comment: (NOTE) The GeneXpert MRSA Assay (FDA approved for NASAL specimens only), is one component of Chinenye Katzenberger comprehensive MRSA colonization surveillance program. It is not intended to diagnose MRSA infection nor to guide or monitor treatment for MRSA infections. Test performance is not FDA approved in patients less than 29 years old. Performed at [facility], [place] [address], [place], [place] [address]   Culture, blood (Routine X 2) w Reflex to ID Panel     Status: None   Collection Time: 09/02/22  9:03 PM   Specimen: BLOOD LEFT HAND  Result Value Ref Range Status   Specimen Description BLOOD LEFT HAND  Final   Special Requests   Final    BOTTLES DRAWN AEROBIC AND ANAEROBIC Blood  Culture results may not be optimal due to an excessive volume of blood received in culture bottles   Culture   Final    NO GROWTH 5 DAYS Performed at [facility], [place] [address] [place], [place] [address]    Report Status 09/07/2022 FINAL  Final  Culture, blood (Routine X 2) w Reflex to ID Panel     Status: None   Collection Time: 09/02/22  9:03 PM   Specimen: BLOOD LEFT ARM  Result Value Ref Range Status   Specimen Description BLOOD LEFT ARM  Final   Special Requests   Final    BOTTLES DRAWN AEROBIC AND ANAEROBIC Blood Culture adequate volume   Culture   Final    NO GROWTH 5 DAYS Performed at [facility], [place] [address] [place], [place] [address]    Report Status 09/07/2022 FINAL  Final  Respiratory (~20 pathogens) panel by PCR     Status: None   Collection Time: 09/03/22 12:18 AM   Specimen: Nasopharyngeal Swab; Respiratory  Result Value Ref Range Status   Adenovirus NOT DETECTED NOT DETECTED Final   Coronavirus 229E NOT DETECTED NOT DETECTED Final    Comment: (NOTE) The Coronavirus on the Respiratory Panel, DOES NOT test for the novel  Coronavirus (2019 nCoV)    Coronavirus HKU1 NOT DETECTED NOT DETECTED Final   Coronavirus NL63 NOT DETECTED NOT DETECTED Final   Coronavirus OC43 NOT DETECTED NOT DETECTED Final   Metapneumovirus NOT DETECTED NOT DETECTED Final   Rhinovirus / Enterovirus NOT DETECTED NOT DETECTED Final   Influenza Kashari Chalmers NOT DETECTED NOT DETECTED Final   Influenza B NOT DETECTED NOT DETECTED Final   Parainfluenza Virus 1 NOT DETECTED NOT DETECTED Final   Parainfluenza Virus 2 NOT DETECTED NOT DETECTED Final   Parainfluenza Virus 3 NOT DETECTED NOT DETECTED Final   Parainfluenza Virus 4 NOT DETECTED NOT DETECTED Final   Respiratory Syncytial Virus NOT DETECTED NOT DETECTED Final   Bordetella pertussis NOT DETECTED NOT DETECTED Final   Bordetella Parapertussis NOT DETECTED NOT DETECTED Final   Chlamydophila pneumoniae NOT DETECTED NOT DETECTED  Final   Mycoplasma pneumoniae NOT DETECTED NOT  DETECTED Final    Comment: Performed at [facility], [place] [address] [place], [place] [address]         Radiology Studies: DG CHEST PORT 1 VIEW  Result Date: 09/10/2022 CLINICAL DATA:  Cough EXAM: PORTABLE CHEST 1 VIEW COMPARISON:  08/16/2022 FINDINGS: Interval removal of endotracheal and enteric tubes. Heart size is normal. Aortic atherosclerosis. Improved aeration of the lung bases with persistently coarsened interstitial markings. No new airspace consolidation. No pleural effusion or pneumothorax. IMPRESSION: Improved aeration of the lung bases with persistently coarsened interstitial markings. No new airspace consolidation. Electronically Signed   By: Davina Poke D.O.   On: 09/10/2022 14:53        Scheduled Meds:  arformoterol  15 mcg Nebulization BID   budesonide (PULMICORT) nebulizer solution  0.5 mg Nebulization BID   docusate sodium  100 mg Oral BID   enoxaparin (LOVENOX) injection  40 mg Subcutaneous [id number]   folic acid  1 mg Oral Daily   multivitamin with minerals  1 tablet Oral Daily   polyethylene glycol  17 g Oral Daily   predniSONE  30 mg Oral Q breakfast   Followed by   Derrill Memo ON 09/13/2022] predniSONE  20 mg Oral Q breakfast   Followed by   Derrill Memo ON 09/15/2022] predniSONE  10 mg Oral Q breakfast   revefenacin  175 mcg Nebulization Daily   senna  1 tablet Oral BID   sodium chloride flush  3 mL Intravenous Q12H   [START ON 09/17/2022] thiamine  100 mg Oral Daily   Continuous Infusions:  sodium chloride 10 mL/hr at 09/10/22 1700   sodium chloride     thiamine (VITAMIN B1) injection 500 mg (09/11/22 1459)   Followed by   Derrill Memo ON 09/12/2022] thiamine (VITAMIN B1) injection       LOS: 9 days    Time spent: over 30 min    Fayrene Helper, MD [facility]   To contact the attending provider between 7A-7P or the covering provider during after hours 7P-7A, please log into the web site  [email or web address] and access using universal  password for that web site. If you do not have the password, please call the hospital operator.  09/11/2022, 5:20 PM

## 2022-09-13 NOTE — Care Management Important Message (Signed)
Important Message  Patient Details  Name: Ronald [name] MRN: [id number] Date of Birth: 27-Mar-1956   Medicare Important Message Given:  Yes     Shelda Altes 09/13/2022, 10:47 AM

## 2022-09-13 NOTE — TOC Progression Note (Addendum)
Transition of Care [facility]) - Progression Note    Patient Details  Name: [name] MRN: [id number] Date of Birth: January 07, 1956  Transition of Care [facility]) CM/SW [place], [place] Phone Number: 09/13/2022, 10:27 AM  Clinical Narrative:     Update- CSW received call back from [place] who confirmed she will start insurance authorization for patient. Patients insurance authorization currently pending. CSW will continue to follow.  Update- CSW spoke with Christine with [facility] who confirmed they can offer SNF bed for patient. CSW spoke with patient and provided SNF bed offer. Patient accepted. CSW Lvm for [employer] with [facility]. CSW awaiting call back to confirm that Altha Harm with [facility] started insurance authorization for patient.   Update- CSW heard back from [facility] who informed CSW they cannot offer SNF bed for patient. Do not accept patients insurance. CSW awaiting to hear back from [facility], and [facility] and [place]. Patient informed CSW if he does not receive any bed offers that he likes that he would like to return home with home health services. CSW awaiting determination from pending SNF facilities.  CSW spoke with Juliann Pulse with [facility] who confirmed they cannot offer SNF bed for patient. CSW informed patient . CSW provided patient with only other bed offer currently with [facility]. Patient declined bed offer with [facility]. CSW checked on pending facility's [place],  [facility] and rehab and [facility]. CSW awaiting determination. CSW will continue to follow and assist with patients dc planning needs.   Expected Discharge Plan: [place] Barriers to Discharge: Continued Medical Work up  Expected Discharge Plan and Services     Post Acute Care Choice: [place] Living arrangements for the past 2 months: Apartment                                        Social Determinants of Health (SDOH) Interventions SDOH Screenings   Tobacco Use: High Risk (04/15/2022)    Readmission Risk Interventions     No data to display

## 2022-09-14 DIAGNOSIS — I739 Peripheral vascular disease, unspecified: Secondary | ICD-10-CM | POA: Diagnosis not present

## 2022-09-14 DIAGNOSIS — F10231 Alcohol dependence with withdrawal delirium: Secondary | ICD-10-CM | POA: Diagnosis not present

## 2022-09-14 DIAGNOSIS — I952 Hypotension due to drugs: Secondary | ICD-10-CM | POA: Diagnosis not present

## 2022-09-14 DIAGNOSIS — J9621 Acute and chronic respiratory failure with hypoxia: Secondary | ICD-10-CM | POA: Diagnosis not present

## 2022-09-14 DIAGNOSIS — J9600 Acute respiratory failure, unspecified whether with hypoxia or hypercapnia: Secondary | ICD-10-CM | POA: Diagnosis not present

## 2022-09-14 DIAGNOSIS — Z89512 Acquired absence of left leg below knee: Secondary | ICD-10-CM | POA: Diagnosis not present

## 2022-09-14 DIAGNOSIS — G9341 Metabolic encephalopathy: Secondary | ICD-10-CM | POA: Diagnosis not present

## 2022-09-14 DIAGNOSIS — Z833 Family history of diabetes mellitus: Secondary | ICD-10-CM | POA: Diagnosis not present

## 2022-09-14 DIAGNOSIS — R0602 Shortness of breath: Secondary | ICD-10-CM | POA: Diagnosis present

## 2022-09-14 DIAGNOSIS — R1310 Dysphagia, unspecified: Secondary | ICD-10-CM | POA: Diagnosis not present

## 2022-09-14 DIAGNOSIS — J9622 Acute and chronic respiratory failure with hypercapnia: Secondary | ICD-10-CM | POA: Diagnosis not present

## 2022-09-14 DIAGNOSIS — J189 Pneumonia, unspecified organism: Secondary | ICD-10-CM | POA: Diagnosis not present

## 2022-09-14 DIAGNOSIS — E871 Hypo-osmolality and hyponatremia: Secondary | ICD-10-CM | POA: Diagnosis not present

## 2022-09-14 DIAGNOSIS — E162 Hypoglycemia, unspecified: Secondary | ICD-10-CM | POA: Diagnosis not present

## 2022-09-14 DIAGNOSIS — I10 Essential (primary) hypertension: Secondary | ICD-10-CM | POA: Diagnosis not present

## 2022-09-14 DIAGNOSIS — Z681 Body mass index (BMI) 19 or less, adult: Secondary | ICD-10-CM | POA: Diagnosis not present

## 2022-09-14 DIAGNOSIS — J441 Chronic obstructive pulmonary disease with (acute) exacerbation: Secondary | ICD-10-CM | POA: Diagnosis not present

## 2022-09-14 DIAGNOSIS — Y900 Blood alcohol level of less than 20 mg/100 ml: Secondary | ICD-10-CM | POA: Diagnosis not present

## 2022-09-14 DIAGNOSIS — F17210 Nicotine dependence, cigarettes, uncomplicated: Secondary | ICD-10-CM | POA: Diagnosis not present

## 2022-09-14 DIAGNOSIS — Z79899 Other long term (current) drug therapy: Secondary | ICD-10-CM | POA: Diagnosis not present

## 2022-09-14 DIAGNOSIS — R54 Age-related physical debility: Secondary | ICD-10-CM | POA: Diagnosis not present

## 2022-09-14 DIAGNOSIS — J440 Chronic obstructive pulmonary disease with acute lower respiratory infection: Secondary | ICD-10-CM | POA: Diagnosis not present

## 2022-09-14 DIAGNOSIS — E43 Unspecified severe protein-calorie malnutrition: Secondary | ICD-10-CM | POA: Diagnosis not present

## 2022-09-14 DIAGNOSIS — I4510 Unspecified right bundle-branch block: Secondary | ICD-10-CM | POA: Diagnosis not present

## 2022-09-14 DIAGNOSIS — D649 Anemia, unspecified: Secondary | ICD-10-CM | POA: Diagnosis not present

## 2022-09-14 LAB — GLUCOSE, CAPILLARY
Glucose-Capillary: 85 mg/dL (ref 70–99)
Glucose-Capillary: 154 mg/dL — ABNORMAL HIGH (ref 70–99)
Glucose-Capillary: 127 mg/dL — ABNORMAL HIGH (ref 70–99)

## 2022-09-14 LAB — BASIC METABOLIC PANEL
Anion gap: 9 (ref 5–15)
BUN: 5 mg/dL — ABNORMAL LOW (ref 8–23)
CO2: 25 mmol/L (ref 22–32)
Calcium: 8.3 mg/dL — ABNORMAL LOW (ref 8.9–10.3)
Chloride: 97 mmol/L — ABNORMAL LOW (ref 98–111)
Creatinine, Ser: 0.48 mg/dL — ABNORMAL LOW (ref 0.61–1.24)
GFR, Estimated: >60 mL/min (ref >60)
Glucose, Bld: 88 mg/dL (ref 70–99)
Potassium: 3.9 mmol/L (ref 3.5–5.1)
Sodium: 131 mmol/L — ABNORMAL LOW (ref 135–145)

## 2022-09-14 LAB — CBC
HCT: 27.7 % — ABNORMAL LOW (ref 39.0–52.0)
Hemoglobin: 9.2 g/dL — ABNORMAL LOW (ref 13.0–17.0)
MCH: 30.8 pg (ref 26.0–34.0)
MCHC: 33.2 g/dL (ref 30.0–36.0)
MCV: 92.6 fL (ref 80.0–100.0)
Platelets: 550 10*3/uL — ABNORMAL HIGH (ref 150–400)
RBC: 2.99 MIL/uL — ABNORMAL LOW (ref 4.22–5.81)
RDW: 13.2 % (ref 11.5–15.5)
WBC: 7.9 10*3/uL (ref 4.0–10.5)
nRBC: 0 % (ref 0.0–0.2)

## 2022-09-14 LAB — PHOSPHORUS: Phosphorus: 3.1 mg/dL (ref 2.5–4.6)

## 2022-09-14 LAB — MAGNESIUM: Magnesium: 1.6 mg/dL — ABNORMAL LOW (ref 1.7–2.4)

## 2022-09-14 NOTE — Progress Notes (Signed)
Speech Language Pathology Treatment: Dysphagia  Patient Details Name: [name] MRN: [id number] DOB: 1956-06-14 Today's Date: 09/14/2022 Time: 1425-1440 SLP Time Calculation (min) (ACUTE ONLY): 15 min  Assessment / Plan / Recommendation Clinical Impression  Patient seen by SLP for skilled treatment session focused on dysphagia goals. When SLP arrived, patient lying in bed, awake and pleasant. He asked "you got any candy?" And when SLP looked in bedside table drawer, loose hard candy in there that he said one of his sisters brought. He then asked for money because he wanted a coke. SLP got him a cola and he drank it while he ate hard candy. One instance of delayed cough but this was very brief and no further coughing or throat clearing observed. Patient continues to be on room air. SLP continues to recommend Dys 3 solids, thin liquids with plan for upgrade trial this week.   HPI HPI: Patient is aa 67 y.o. male with PMH: COPD, daily smoker, alcohol abuse, left BKA who presented to the hospital on 09/02/22 with dyspnea, increased WOB and wheezing. In ER, his SpO2 was in 80%'s  and he was started on BiPAP but ultimately intubated due to increased respiratory distress. Chest radiograph did not show opacities or infiltrates, VBG showed pH of 7/269 and pCO2 of 72. Patient extubated on 09/06/22.      SLP Plan  Continue with current plan of care      Recommendations for follow up therapy are one component of a multi-disciplinary discharge planning process, led by the attending physician.  Recommendations may be updated based on patient status, additional functional criteria and insurance authorization.    Recommendations  Diet recommendations: Dysphagia 3 (mechanical soft);Thin liquid Liquids provided via: Cup;Straw Medication Administration: Whole meds with puree Supervision: Patient able to self feed;Intermittent supervision to cue for compensatory strategies Compensations: Minimize  environmental distractions;Slow rate;Small sips/bites Postural Changes and/or Swallow Maneuvers: Seated upright 90 degrees;Upright 30-60 min after meal                Oral Care Recommendations: Staff/trained caregiver to provide oral care;Oral care BID Follow Up Recommendations: Follow physician's recommendations for discharge plan and follow up therapies Assistance recommended at discharge: Intermittent Supervision/Assistance SLP Visit Diagnosis: Dysphagia, unspecified (R13.10) Plan: Continue with current plan of care          Sonia Baller, MA, CCC-SLP Speech Therapy
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
## 2022-09-14 NOTE — TOC Progression Note (Signed)
Transition of Care [facility]) - Progression Note    Patient Details  Name: [name] MRN: [id number] Date of Birth: Oct 02, 1955  Transition of Care [facility]) CM/SW [place], [place] Phone Number: 09/14/2022, 11:33 AM  Clinical Narrative:     CSW received call from [place] with [facility] who informed CSW that patients medicaid insurance denied SNF placement for patient due to medicare showing as primary. Patient does not have medicare A benefit just Medicare B. CSW informed MD, CM, and patient. TOC will continue to follow and assist with patients dc planning needs.  Expected Discharge Plan: [place] Barriers to Discharge: Continued Medical Work up  Expected Discharge Plan and Services     Post Acute Care Choice: [place] Living arrangements for the past 2 months: Apartment                                       Social Determinants of Health (SDOH) Interventions SDOH Screenings   Tobacco Use: High Risk (04/15/2022)    Readmission Risk Interventions     No data to display
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
## 2022-09-14 NOTE — Progress Notes (Signed)
PROGRESS NOTE    [name]  EYC:[id number] DOB: Jun 05, 1956 DOA: 09/02/2022 PCP: Demetrios Isaacs, FNP  Chief Complaint  Patient presents with   Altered Mental Status    Brief Narrative:  Ronald [name] is Ronald [name] 67 y.o. Male with Ronald [name] PMH of COPD, ETOH and substance abuse, Left BKA, Smoker, who presented to the ED with hypoxia. Per family, pt was supposed to have Ronald [name] CXR completed today per PCP, but it was closed and when the patient's daughter picked him up, she noticed his WOB was increasing worse than normal. On arrival, he was noted to be disoriented to date and having audible wheezing, SHOB, and cough with Spo2 of 87%.    EDP concerned for alcohol withdrawal seizure but was too somnolent for sedation without requiring intubation. Therefore, pt was started on BiPAP and then intubated d/t worsening respiratory status. ED workup reveled Na 129, Chloride 86, WBC 14.7. VBG 7.269/72.4/35/33.2. CXR with no acute findings.   PCCM consulted for management of respiratory failure and mechanical ventilation.   Significant Events 12/21 Admitted, Failed BiPAP > Intubated, CT Angio Ordered, azithromycin and rocephin started  12/23 failed weaning attempt 12/24 not able to wean 12/24 weaning today 12/25-extubated this morning Stable for discharge, working on safe d/c   Assessment & Plan:   Principal Problem:   Acute respiratory failure ([place]) Active Problems:   Hyponatremia   ETOH abuse   COPD (chronic obstructive pulmonary disease) ([name])   Acute hypercapnic respiratory failure (HCC)   Right bundle branch block   Acute encephalopathy   Hx of BKA, left (HCC)   Hypotension   Protein-calorie malnutrition, severe  Dispo Medically ready to d/c.  Unfortunately, patient only has medicare B benefit, not medicare Ronald [name].  Denied for SNF placement.  Patient would d/c home, but therapy recommending supervision at home which he does not have (he says someone will be with him, but can't provide phone  numbers).  His sister, Ronald [name], notes no one would be home with him and the individuals previously in the home are not there anymore.  Concern about safety on discharge.  Apparently home is unsafe as well per discussion with sister (see 1/2 TOC note from CM).  He'd leave AMA if he could, but his sisters won't transport him home.  He can't independently leave the hospital.  Working on safe discharge plan with [facility] team.  Looking into ALF option.  APS per social work,.   Acute on chronic hypoxemic and hypercapneic respiratory failure COPD Exacerbation Continue steroids, scheduled nebs, prn nebs.  Will transition to oral steroids. 12/21-12/25 ceftriaxone.  S/p 3 doses of azithromycin. CXR with improved aeration of the lung bases with coarsened interstitial markings  Acute Metabolic Encephalopathy Concern for Seizure Like Activity EEG with moderate to severe diffuse encephalopathy Suspect related to etoh withdrawal, hypercarbia, ICU delirium, ? Etoh dementia High dose thiamine  Follow b12, folate, TSH -> wnl Ammonia is wnl. Head CT 12/21 without acute intracranial abnormality Delirium precautions Additional w/u if does not continue to improve - he's been gradually improving  EtOH dependence Phenobarbital taper complete CIWA  Encourage cessation   Dysphagia  Hypoglycemia Cortrak was in place, he pulled this out 12/28 SLP recommending dysphagia 3, thin Noted intermittent cough, si/sx aspiration -> he declined MBS with SLP Will continue to monitor off tube feeds A1c 5.6, trend BG's  Acute to Subacute Nondisplaced Fx of the Proximal Metaphysis of the Proximal Phalanx of the Second Toe WBAT in hard sole shoe per orthopedics Outpatient  follow up   Severe protein calorie malnutrition POA Follow PO intake, leave cortrak out   Hx PVD Doesn't appear to be on any meds Follow outpatient    Hypertension Hold amlodipine and lisinopril for now    DVT prophylaxis: lovenox Code Status:  full Family Communication: sister Ronald [name] over phone Disposition:   Status is: Inpatient Remains inpatient appropriate because: no safe discharge plan at this point   Consultants:  PCCM  Procedures:  Echo IMPRESSIONS     1. Left ventricular ejection fraction, by estimation, is 65 to 70%. The  left ventricle has normal function. The left ventricle has no regional  wall motion abnormalities. There is mild left ventricular hypertrophy.  Left ventricular diastolic parameters  are consistent with Grade I diastolic dysfunction (impaired relaxation).   2. Right ventricular systolic function is normal. The right ventricular  size is normal. There is normal pulmonary artery systolic pressure.   3. Trivial mitral valve regurgitation.   4. Aortic valve regurgitation is not visualized.   5. Aneurysm of the ascending aorta, measuring 41 mm.   6. The inferior vena cava is dilated in size with <50% respiratory  variability, suggesting right atrial pressure of 15 mmHg.   Antimicrobials:  Anti-infectives (From admission, onward)    Start     Dose/Rate Route Frequency Ordered Stop   09/02/22 1800  azithromycin (ZITHROMAX) 500 mg in sodium chloride 0.9 % 250 mL IVPB  Status:  Discontinued        500 mg 250 mL/hr over 60 Minutes Intravenous Every 24 hours 09/02/22 1755 09/05/22 1213   09/02/22 1800  cefTRIAXone (ROCEPHIN) 2 g in sodium chloride 0.9 % 100 mL IVPB        2 g 200 mL/hr over 30 Minutes Intravenous Every 24 hours 09/02/22 1756 09/06/22 1832       Subjective: Denies new complaints  Objective: Vitals:   09/14/22 0419 09/14/22 0500 09/14/22 0735 09/14/22 1142  BP: (!) 160/97   112/86  Pulse: 92   97  Resp: 18   18  Temp: 98.4 F (36.9 C)   98.5 F (36.9 C)  TempSrc: Oral   Oral  SpO2: 99%  97% 98%  Weight:  57.3 kg    Height:        Intake/Output Summary (Last 24 hours) at 09/14/2022 1840 Last data filed at 09/14/2022 0900 Gross per 24 hour  Intake 400 ml  Output 1000  ml  Net -600 ml   Filed Weights   09/12/22 0613 09/13/22 0457 09/14/22 0500  Weight: 57 kg 57.1 kg 57.3 kg    Examination:  General: No acute distress. Cardiovascular: RRR Lungs: unlabored Abdomen: Soft, nontender, nondistended  Neurological: Alert and oriented 3. Moves all extremities 4 with equal strength. Cranial nerves II through XII grossly intact. Extremities: L AKA   Data Reviewed: I have personally reviewed following labs and imaging studies  CBC: Recent Labs  Lab 09/08/22 0229 09/09/22 1637 09/10/22 0609 09/11/22 0142 09/14/22 0300  WBC 8.8 5.4 6.2 7.9 7.9  NEUTROABS  --  4.5 3.9 5.3  --   HGB 10.2* 10.7* 10.5* 9.8* 9.2*  HCT 29.6* 31.1* 30.5* 27.9* 27.7*  MCV 91.9 91.7 92.4 92.1 92.6  PLT 418* 448* 453* 427* 550*    Basic Metabolic Panel: Recent Labs  Lab 09/08/22 0229 09/09/22 1637 09/10/22 0609 09/11/22 0142 09/14/22 0300  NA 134* 134* 135 134* 131*  K 4.2 4.6 3.9 3.7 3.9  CL 98 100 98 99 97*  CO2 26 27 27 27 25   GLUCOSE 82 116* 84 96 88  BUN 13 15 12 17  5*  CREATININE 0.56* 0.63 0.47* 0.65 0.48*  CALCIUM 8.3* 8.3* 8.5* 8.3* 8.3*  MG 2.0 1.7 1.7 1.8 1.6*  PHOS 3.5 3.4 3.1 3.1 3.1    GFR: Estimated Creatinine Clearance: 74.6 mL/min (Alexsander [name]) (by C-G formula based on SCr of 0.48 mg/dL (L)).  Liver Function Tests: Recent Labs  Lab 09/09/22 1637 09/10/22 0609 09/11/22 0142  AST 30 30 25   ALT 32 31 27  ALKPHOS 56 52 52  BILITOT 0.4 0.7 0.4  PROT 6.6 6.8 6.1*  ALBUMIN 2.7* 2.8* 2.8*    CBG: Recent Labs  Lab 09/13/22 1657 09/13/22 2121 09/14/22 0748 09/14/22 1144 09/14/22 1721  GLUCAP 135* 124* 85 154* 127*     No results found for this or any previous visit (from the past 240 hour(s)).        Radiology Studies: No results found.      Scheduled Meds:  arformoterol  15 mcg Nebulization BID   budesonide (PULMICORT) nebulizer solution  0.5 mg Nebulization BID   docusate sodium  100 mg Oral BID   enoxaparin (LOVENOX)  injection  40 mg Subcutaneous L87F   folic acid  1 mg Oral Daily   multivitamin with minerals  1 tablet Oral Daily   polyethylene glycol  17 g Oral Daily   [START ON 09/15/2022] predniSONE  10 mg Oral Q breakfast   revefenacin  175 mcg Nebulization Daily   senna  1 tablet Oral BID   sodium chloride flush  3 mL Intravenous Q12H   [START ON 09/17/2022] thiamine  100 mg Oral Daily   Continuous Infusions:  sodium chloride 10 mL/hr at 09/10/22 1700   sodium chloride     thiamine (VITAMIN B1) injection 250 mg (09/14/22 0900)     LOS: 12 days    Time spent: over 30 min    Ronald Helper, MD [facility]   To contact the attending provider between 7A-7P or the covering provider during after hours 7P-7A, please log into the web site [email or web address] and access using universal [place] password for that web site. If you do not have the password, please call the hospital operator.  09/14/2022, 6:40 PM

## 2022-09-15 DIAGNOSIS — J9600 Acute respiratory failure, unspecified whether with hypoxia or hypercapnia: Secondary | ICD-10-CM | POA: Diagnosis not present

## 2022-09-15 DIAGNOSIS — J189 Pneumonia, unspecified organism: Secondary | ICD-10-CM | POA: Diagnosis not present

## 2022-09-15 DIAGNOSIS — R0602 Shortness of breath: Secondary | ICD-10-CM | POA: Diagnosis not present

## 2022-09-15 LAB — URINE DRUGS OF ABUSE SCREEN W ALC, ROUTINE (REF LAB)
Amphetamines, Urine: NEGATIVE ng/mL
Benzodiazepine Quant, Ur: NEGATIVE ng/mL
Cannabinoid Quant, Ur: NEGATIVE ng/mL
Ethanol U, Quan: NEGATIVE %
Methadone Screen, Urine: NEGATIVE ng/mL
Opiate Quant, Ur: NEGATIVE ng/mL
Phencyclidine, Ur: NEGATIVE ng/mL
Propoxyphene, Urine: NEGATIVE ng/mL

## 2022-09-15 LAB — BASIC METABOLIC PANEL
Anion gap: 10 (ref 5–15)
BUN: 7 mg/dL — ABNORMAL LOW (ref 8–23)
CO2: 24 mmol/L (ref 22–32)
Calcium: 8.2 mg/dL — ABNORMAL LOW (ref 8.9–10.3)
Chloride: 96 mmol/L — ABNORMAL LOW (ref 98–111)
Creatinine, Ser: 0.5 mg/dL — ABNORMAL LOW (ref 0.61–1.24)
GFR, Estimated: >60 mL/min (ref >60)
Glucose, Bld: 97 mg/dL (ref 70–99)
Potassium: 3.8 mmol/L (ref 3.5–5.1)
Sodium: 130 mmol/L — ABNORMAL LOW (ref 135–145)

## 2022-09-15 LAB — DRUG PROFILE 799023
Amobarbital, Ur: NEGATIVE
BARBITURATES: POSITIVE — AB
Butalbital, Ur: NEGATIVE
Pentobarbital, Ur: NEGATIVE
Phenobarbital, GC/MS: 4586 ng/mL
Phenobarbital,Ur: POSITIVE — AB
Secobarbital, Ur: NEGATIVE

## 2022-09-15 LAB — GLUCOSE, CAPILLARY
Glucose-Capillary: 96 mg/dL (ref 70–99)
Glucose-Capillary: 106 mg/dL — ABNORMAL HIGH (ref 70–99)
Glucose-Capillary: 133 mg/dL — ABNORMAL HIGH (ref 70–99)

## 2022-09-15 LAB — CBC
HCT: 26 % — ABNORMAL LOW (ref 39.0–52.0)
Hemoglobin: 8.8 g/dL — ABNORMAL LOW (ref 13.0–17.0)
MCH: 31.2 pg (ref 26.0–34.0)
MCHC: 33.8 g/dL (ref 30.0–36.0)
MCV: 92.2 fL (ref 80.0–100.0)
Platelets: 589 10*3/uL — ABNORMAL HIGH (ref 150–400)
RBC: 2.82 MIL/uL — ABNORMAL LOW (ref 4.22–5.81)
RDW: 13.2 % (ref 11.5–15.5)
WBC: 7.8 10*3/uL (ref 4.0–10.5)
nRBC: 0 % (ref 0.0–0.2)

## 2022-09-15 LAB — COCAINE CONF, UR
Benzoylecgonine GC/MS Conf: 1600 ng/mL
Cocaine Metab Quant, Ur: POSITIVE — AB

## 2022-09-15 LAB — PHOSPHORUS: Phosphorus: 3.8 mg/dL (ref 2.5–4.6)

## 2022-09-15 LAB — MAGNESIUM: Magnesium: 1.5 mg/dL — ABNORMAL LOW (ref 1.7–2.4)

## 2022-09-15 MED ORDER — ENSURE ENLIVE PO LIQD
237.0000 mL | Freq: Two times a day (BID) | ORAL | Status: DC
  Administered 2022-09-16 – 2022-09-17 (×4): 237 mL via ORAL
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
## 2022-09-15 NOTE — Progress Notes (Signed)
Nutrition Follow-up  DOCUMENTATION CODES:   Severe malnutrition in context of chronic illness  INTERVENTION:  - Add Ensure Enlive po BID, each supplement provides 350 kcal and 20 grams of protein.  NUTRITION DIAGNOSIS:   Severe Malnutrition related to chronic illness (COPD, ETOH abuse) as evidenced by severe fat depletion, severe muscle depletion, percent weight loss (28.8% weight loss in 7 months).  GOAL:   Patient will meet greater than or equal to 90% of their needs  MONITOR:   Diet advancement, Labs  REASON FOR ASSESSMENT:   Ventilator, Consult Enteral/tube feeding initiation and management  ASSESSMENT:   68 year old male who presented to the ED on 12/21 with AMS, SOB. Pt required intubation in the ED. PMH COPD, ETOH abuse, L BKA. Pt admitted with acute respiratory failure with hypercarbia, PNA, hyponatremia, acute metabolic encephalopathy.  Meds reviewed: colace, folic acid, MVI, miralax, senokot, thiamine. Labs reviewed: Na low, chloride low, Magnesium low.   The pt reports that he has been eating very well. Per record, pt has been eating 60-100% of his meals over the past 4 days. Pt states that he would Ensures BID. RD will add supplements and continue to monitor PO intakes.   Diet Order:   Diet Order             Diet regular Room service appropriate? Yes with Assist; Fluid consistency: Thin  Diet effective now                   EDUCATION NEEDS:   Not appropriate for education at this time  Skin:  Skin Assessment: Reviewed RN Assessment  Last BM:  09/13/22  Height:   Ht Readings from Last 1 Encounters:  09/02/22 5\' 4"  (1.626 m)    Weight:   Wt Readings from Last 1 Encounters:  09/15/22 56.7 kg    Ideal Body Weight:  55.6 kg (for BKA (5.9%))  BMI:  Body mass index is 21.46 kg/m.  Estimated Nutritional Needs:   Kcal:  1750-1950  Protein:  85-100 grams  Fluid:  1.7-1.9 L  Thalia Bloodgood, RD, LDN, CNSC.

## 2022-09-15 NOTE — Progress Notes (Signed)
Speech Language Pathology Treatment: Dysphagia  Patient Details Name: Ronald [name] MRN: [id number] DOB: 24-Jan-1956 Today's Date: 09/15/2022 Time: 1400-1420 SLP Time Calculation (min) (ACUTE ONLY): 20 min  Assessment / Plan / Recommendation Clinical Impression  Patient seen by SLP for skilled treatment session focused on dysphagia goals. Patient awake, alert, on phone with his brother. He was receptive to snack of graham crackers and soda. SLP elevated head of bed as patient initially was reclined trying to eat graham crackers and did exhibit one instance of cough. No further overt s/s aspiration or penetration observed with straw sips of thin liquids or graham crackers. SLP is recommending to upgrade patient's diet from Dys 3 (mechanical soft) solids to regular solids and no further skilled intervention warranted at this time. Please reorder if patient exhibiting significant change in swallow function. (Ie: excessive coughing with PO intake)    HPI HPI: Patient is aa 67 y.o. male with PMH: COPD, daily smoker, alcohol abuse, left BKA who presented to the hospital on 09/02/22 with dyspnea, increased WOB and wheezing. In ER, his SpO2 was in 80%'s  and he was started on BiPAP but ultimately intubated due to increased respiratory distress. Chest radiograph did not show opacities or infiltrates, VBG showed pH of 7/269 and pCO2 of 72. Patient extubated on 09/06/22.      SLP Plan  Discharge SLP treatment due to (comment) (goals met)      Recommendations for follow up therapy are one component of a multi-disciplinary discharge planning process, led by the attending physician.  Recommendations may be updated based on patient status, additional functional criteria and insurance authorization.    Recommendations  Diet recommendations: Thin liquid;Regular Liquids provided via: Cup;Straw Medication Administration: Whole meds with puree Supervision: Patient able to self feed;Intermittent supervision to  cue for compensatory strategies Compensations: Minimize environmental distractions;Slow rate;Small sips/bites Postural Changes and/or Swallow Maneuvers: Seated upright 90 degrees;Upright 30-60 min after meal                Oral Care Recommendations: Staff/trained caregiver to provide oral care;Oral care BID Follow Up Recommendations: No SLP follow up Assistance recommended at discharge: Frequent or constant Supervision/Assistance SLP Visit Diagnosis: Dysphagia, unspecified (R13.10) Plan: Discharge SLP treatment due to (comment) (goals met)           Ronald Baller, MA, CCC-SLP Speech Therapy

## 2022-09-15 NOTE — Progress Notes (Signed)
Mobility Specialist - Progress Note   09/15/22 1500  Mobility  Activity Dangled on edge of bed  Level of Assistance Minimal assist, patient does 75% or more  Range of Motion/Exercises Right leg;Left leg  Activity Response Tolerated fair  Mobility Referral Yes  $Mobility charge 1 Mobility   Pt received in bed and agreeable to session. Pt declined OOB mobility but agreeable to EOB and bed level mobility. No complaints throughout. Pt was returned to supine with all needs met.   Franki Monte  Mobility Specialist Please contact via [place] or Rehab office at [phone]

## 2022-09-15 NOTE — Progress Notes (Signed)
Occupational Therapy Treatment Patient Details Name: Ronald [name] MRN: [id number] DOB: 28-Jan-1956 Today's Date: 09/15/2022   History of present illness Patient is a 67 y.o. Male with a PMH of COPD, ETOH and substance abuse, Left BKA, Smoker, who presented to the ED with hypoxia. Required intubation due to worsening respiratory status. Extubated 12/25.   OT comments  Pt progressing towards goals, cognition seems to have improved since time of evaluation, pt states other people come stay with him/assist 24/7 typically. Pt needing set up-min guard A for bed mobility, LB dressing, and seated grooming task at EOB. Pt able to complete lateral scooting at EOB with min guard A using BUE, pt declining OOB mobility. Per Hospitalist note on 1/2, ortho recommending RLE WBAT in hard sole shoe, asked pt to have family bring a shoe from home as pt does not have any present in room. Pt presenting with impairments listed below, will  follow acutely. Updated d/c recommendation to [facility] at d/c since pt unable to go to SNF.   Recommendations for follow up therapy are one component of a multi-disciplinary discharge planning process, led by the attending physician.  Recommendations may be updated based on patient status, additional functional criteria and insurance authorization.    Follow Up Recommendations  Home health OT     Assistance Recommended at Discharge Frequent or constant Supervision/Assistance  Patient can return home with the following  A lot of help with walking and/or transfers;A lot of help with bathing/dressing/bathroom;Assistance with cooking/housework;Assist for transportation;Help with stairs or ramp for entrance   Equipment Recommendations  Tub/shower seat    Recommendations for Other Services PT consult    Precautions / Restrictions Precautions Precautions: Fall Restrictions Weight Bearing Restrictions: Yes RLE Weight Bearing: Weight bearing as tolerated Other Position/Activity  Restrictions: in hard sole shoe per ortho       Mobility Bed Mobility Overal bed mobility: Needs Assistance Bed Mobility: Supine to Sit     Supine to sit: HOB elevated, Supervision Sit to supine: Supervision        Transfers                  Lateral/Scoot Transfers: Min guard General transfer comment: simulated at EOB as pt declining OOB to chair, able to lateral scoot using arms only, deferred WB on RLE due to no hard sole shoe in room     Balance Overall balance assessment: Needs assistance Sitting-balance support: Feet supported, No upper extremity supported Sitting balance-Leahy Scale: Good Sitting balance - Comments: sits EOB x10 min for ADL                                   ADL either performed or assessed with clinical judgement   ADL Overall ADL's : Needs assistance/impaired     Grooming: Set up;Sitting;Oral care Grooming Details (indicate cue type and reason): brushing teeth seated EOB             Lower Body Dressing: Min guard;Bed level Lower Body Dressing Details (indicate cue type and reason): performed supine in bed, bringing knee to chest             Functional mobility during ADLs: Min guard      Extremity/Trunk Assessment Upper Extremity Assessment Upper Extremity Assessment: Generalized weakness (tremoring in BUE at times with movement initiation, pt states this is baseline)   Lower Extremity Assessment Lower Extremity Assessment: Defer to PT evaluation  Vision   Vision Assessment?: No apparent visual deficits   Perception Perception Perception: Not tested   Praxis Praxis Praxis: Not tested    Cognition Arousal/Alertness: Awake/alert Behavior During Therapy: Impulsive                             Safety/Judgement: Decreased awareness of safety, Decreased awareness of deficits Awareness: Emergent            Exercises      Shoulder Instructions       General Comments HR in  110's with EOB grooming task    Pertinent Vitals/ Pain       Pain Assessment Pain Assessment: No/denies pain  Home Living                                          Prior Functioning/Environment              Frequency  Min 2X/week        Progress Toward Goals  OT Goals(current goals can now be found in the care plan section)  Progress towards OT goals: Progressing toward goals  Acute Rehab OT Goals Patient Stated Goal: none stated OT Goal Formulation: With patient Time For Goal Achievement: 09/23/22 Potential to Achieve Goals: Good ADL Goals Pt Will Perform Upper Body Dressing: with min assist;sitting Pt Will Perform Lower Body Dressing: with min assist;sit to/from stand Pt Will Transfer to Toilet: with min assist;ambulating;bedside commode Additional ADL Goal #1: pt will sit EOB x5 min with fair balance in prep for ADLs  Plan Frequency remains appropriate;Discharge plan needs to be updated    Co-evaluation                 AM-PAC OT "6 Clicks" Daily Activity     Outcome Measure   Help from another person eating meals?: A Little Help from another person taking care of personal grooming?: A Little Help from another person toileting, which includes using toliet, bedpan, or urinal?: Total Help from another person bathing (including washing, rinsing, drying)?: A Lot Help from another person to put on and taking off regular upper body clothing?: A Little Help from another person to put on and taking off regular lower body clothing?: A Little 6 Click Score: 15    End of Session    OT Visit Diagnosis: Unsteadiness on feet (R26.81);Other abnormalities of gait and mobility (R26.89);Muscle weakness (generalized) (M62.81)   Activity Tolerance Patient tolerated treatment well   Patient Left in bed;with call bell/phone within reach;with bed alarm set;with nursing/sitter in room   Nurse Communication Mobility status        Time: 7939-0300 OT  Time Calculation (min): 18 min  Charges: OT General Charges $OT Visit: 1 Visit OT Treatments $Self Care/Home Management : 8-22 mins  Renaye Rakers, OTD, OTR/L SecureChat Preferred Acute Rehab [phone] - [place] 09/15/2022, 10:35 AM

## 2022-09-15 NOTE — Progress Notes (Signed)
[facility] PROGRESS NOTE  [name] (DOB: 08-Aug-1956) EXN:[id number] PCP: Demetrios Isaacs, FNP  Brief Narrative: Ronald [name] is a 67 y.o. Male with a PMH of COPD, ETOH and substance abuse, Left BKA, Smoker, who presented to the ED with hypoxia. Per family, pt was supposed to have a CXR completed today per PCP, but it was closed and when the patient's daughter picked him up, she noticed his WOB was increasing worse than normal. On arrival, he was noted to be disoriented to date and having audible wheezing, SHOB, and cough with Spo2 of 87%.    EDP concerned for alcohol withdrawal seizure but was too somnolent for sedation without requiring intubation. Therefore, pt was started on BiPAP and then intubated d/t worsening respiratory status. ED workup reveled Na 129, Chloride 86, WBC 14.7. VBG 7.269/72.4/35/33.2. CXR with no acute findings.   PCCM consulted for management of respiratory failure and mechanical ventilation.    Significant Events 12/21 Admitted, Failed BiPAP > Intubated, CT Angio Ordered, azithromycin and rocephin started  12/23 failed weaning attempt 12/24 not able to wean 12/24 weaning today 12/25-extubated this morning Stable for discharge, working on safe d/c  Subjective: No new complaints but wants to go home.   Objective: BP (!) 149/84 (BP Location: Right Arm)   Pulse 98   Temp 98.6 F (37 C) (Oral)   Resp 18   Ht 5\' 4"  (1.626 m)   Wt 56.7 kg   SpO2 98%   BMI 21.46 kg/m   Gen: Chronically ill-appearing, frail, malnourished male in no acute distress Pulm: Clear, nonlabored  CV: RRR, no pitting edema GI: Soft, NT, ND, +BS  Neuro: Alert and oriented. No new focal deficits. Ext: Warm, no new deformities, stable AKA Skin: No new rashes, lesions or ulcers on visualized skin   Assessment & Plan: Dispo Medically ready to d/c.  Unfortunately, patient only has medicare B benefit, not medicare A.  Denied for SNF placement.  Patient would d/c home, but  therapy recommending supervision at home which he does not have (he says someone will be with him, but can't provide phone numbers).  His sister, Ronald [name], notes no one would be home with him and the individuals previously in the home are not there anymore.  Concern about safety on discharge.  Apparently home is unsafe as well per discussion with sister (see 1/2 TOC note from CM).  He'd leave AMA if he could, but his sisters won't transport him home.  He can't independently leave the hospital.  Working on safe discharge plan with [facility] team.  Looking into ALF option.  APS per social work. APS referral made today.   Acute on chronic hypoxemic and hypercapneic respiratory failure COPD Exacerbation Continue steroids, scheduled nebs, prn nebs.  Will transition to oral steroids. 12/21-12/25 ceftriaxone.  S/p 3 doses of azithromycin. CXR with improved aeration of the lung bases with coarsened interstitial markings   Acute Metabolic Encephalopathy Concern for Seizure Like Activity EEG with moderate to severe diffuse encephalopathy Suspect related to etoh withdrawal, hypercarbia, ICU delirium, ? Etoh dementia High dose thiamine  Follow b12, folate, TSH -> wnl Ammonia is wnl. Head CT 12/21 without acute intracranial abnormality Delirium precautions Additional w/u if does not continue to improve - he's been gradually improving   EtOH dependence Phenobarbital taper complete. No ongoing withdrawal  Encourage cessation   Dysphagia  Hypoglycemia: SLP recommending dysphagia 3, thin Noted intermittent cough, si/sx aspiration -> he declined MBS with SLP Will continue to monitor off tube  feeds A1c 5.6, trend BG's   Acute to Subacute Nondisplaced Fx of the Proximal Metaphysis of the Proximal Phalanx of the Second Toe WBAT in hard sole shoe per orthopedics Outpatient follow up    Severe protein calorie malnutrition POA Follow PO intake, leave cortrak out   Hx PVD Doesn't appear to be on any  meds Follow outpatient    Hypertension Hold amlodipine and lisinopril for now  Patrecia Pour, MD [facility] [email or web address] 09/15/2022, 4:00 PM

## 2022-09-16 DIAGNOSIS — J449 Chronic obstructive pulmonary disease, unspecified: Secondary | ICD-10-CM | POA: Diagnosis not present

## 2022-09-16 DIAGNOSIS — J9600 Acute respiratory failure, unspecified whether with hypoxia or hypercapnia: Secondary | ICD-10-CM | POA: Diagnosis not present

## 2022-09-16 DIAGNOSIS — Z89512 Acquired absence of left leg below knee: Secondary | ICD-10-CM

## 2022-09-16 DIAGNOSIS — E43 Unspecified severe protein-calorie malnutrition: Secondary | ICD-10-CM | POA: Diagnosis not present

## 2022-09-16 DIAGNOSIS — R0602 Shortness of breath: Secondary | ICD-10-CM | POA: Diagnosis not present

## 2022-09-16 DIAGNOSIS — I4510 Unspecified right bundle-branch block: Secondary | ICD-10-CM

## 2022-09-16 DIAGNOSIS — J189 Pneumonia, unspecified organism: Secondary | ICD-10-CM | POA: Diagnosis not present

## 2022-09-16 LAB — CREATININE, SERUM
Creatinine, Ser: 0.5 mg/dL — ABNORMAL LOW (ref 0.61–1.24)
GFR, Estimated: >60 mL/min (ref >60)

## 2022-09-16 MED ORDER — AMLODIPINE BESYLATE 10 MG PO TABS
10.0000 mg | ORAL_TABLET | Freq: Every day | ORAL | Status: DC
  Administered 2022-09-16 – 2022-09-17 (×2): 10 mg via ORAL
  Filled 2022-09-16 (×2): qty 1

## 2022-09-16 NOTE — Progress Notes (Signed)
Report called to [place] on [facility].

## 2022-09-16 NOTE — TOC Progression Note (Addendum)
Transition of Care [facility]) - Progression Note    Patient Details  Name: Ronald [name] MRN: [id number] Date of Birth: 10-06-1955  Transition of Care [facility]) CM/SW [place], [place] Phone Number: 09/16/2022, 3:42 PM  Clinical Narrative:     Patient agreeable to return back home when medically ready for dc. Patient informed CSW patient is locked out of house.Patient agreeable  for TOC to contact locksmith to help assist with getting him back into his home. CSW contacted [employer] and [employer], CSW to follow back up with [name] with [employer] [employer] and [employer] in the morning to set up a time for him to meet patient at his house to help get patient back into his home. Patient agreeable to be transported by [employer] transportation. [place] confirmed to contact them in the morning to set up transportation for patient. CSW will follow back up with lock smith and [employer] transportation in the morning. CSW will continue to follow and assist with patients dc planning needs.    Expected Discharge Plan: Home/Self Care Barriers to Discharge: Continued Medical Work up  Expected Discharge Plan and [place] Choice: [name] Living arrangements for the past 2 months: Apartment                                       Social Determinants of Health (SDOH) Interventions SDOH Screenings   Tobacco Use: High Risk (04/15/2022)    Readmission Risk Interventions     No data to display

## 2022-09-16 NOTE — Progress Notes (Signed)
[facility] PROGRESS NOTE  [name] (DOB: Feb 15, 1956) OIZ:[id number] PCP: Demetrios Isaacs, FNP  Brief Narrative: Ronald [name] is a 67 y.o. Male with a PMH of COPD, ETOH and substance abuse, Left BKA, Smoker, who presented to the ED with hypoxia. Per family, pt was supposed to have a CXR completed today per PCP, but it was closed and when the patient's daughter picked him up, she noticed his WOB was increasing worse than normal. On arrival, he was noted to be disoriented to date and having audible wheezing, SHOB, and cough with Spo2 of 87%.    EDP concerned for alcohol withdrawal seizure but was too somnolent for sedation without requiring intubation. Therefore, pt was started on BiPAP and then intubated d/t worsening respiratory status. ED workup reveled Na 129, Chloride 86, WBC 14.7. VBG 7.269/72.4/35/33.2. CXR with no acute findings.   PCCM consulted for management of respiratory failure and mechanical ventilation.    Significant Events 12/21 Admitted, Failed BiPAP > Intubated, CT Angio Ordered, azithromycin and rocephin started  12/23 failed weaning attempt 12/24 not able to wean 12/24 weaning today 12/25-extubated this morning Stable for discharge, working on safe d/c  Subjective: No new complaints, though he does want some candy.  Objective: BP (!) 144/85 (BP Location: Right Arm)   Pulse 98   Temp 98.1 F (36.7 C) (Oral)   Resp 15   Ht 5\' 4"  (1.626 m)   Wt 57.3 kg   SpO2 100%   BMI 21.68 kg/m   Gen: Chronically ill-appearing male resting quietly Pulm: Nonlabored  CV: RRR, no pitting edema Neuro: Alert and oriented. No new focal deficits. Ext: Warm, dry, L BKA stable. Skin: No rashes, lesions or ulcers on visualized skin   Assessment & Plan: Disposition: Patient remains medically stable to leave the hospital once a safe venue is secured. CM working diligently on this. Unfortunately, patient only has medicare B benefit, not medicare A.  Denied for SNF  placement.  Patient would d/c home, but therapy recommending supervision at home which he does not have (he says someone will be with him, but can't provide phone numbers).  His sister, Ronald [name], notes no one would be home with him and the individuals previously in the home are not there anymore.  Concern about safety on discharge.  Apparently home is unsafe as well per discussion with sister (see 1/2 TOC note from CM).  He'd leave AMA if he could, but his sisters won't transport him home.  He can't independently leave the hospital.  Working on safe discharge plan with [facility] team.  Looking into ALF option.  APS per social work. APS referral made. - To facilitate mobility, DC telemetry   Acute on chronic hypoxemic and hypercapneic respiratory failure COPD Exacerbation Continue steroids, scheduled nebs, prn nebs.  Will transition to oral steroids. 12/21-12/25 ceftriaxone.  S/p 3 doses of azithromycin. CXR with improved aeration of the lung bases with coarsened interstitial markings   Acute Metabolic Encephalopathy Concern for Seizure Like Activity EEG with moderate to severe diffuse encephalopathy Suspect related to etoh withdrawal, hypercarbia, ICU delirium, ? Etoh dementia High dose thiamine  Follow b12, folate, TSH -> wnl Ammonia is wnl. Head CT 12/21 without acute intracranial abnormality Delirium precautions Additional w/u if does not continue to improve - he's been gradually improving   EtOH dependence Phenobarbital taper complete. No ongoing withdrawal  Encourage cessation   Dysphagia  Hypoglycemia: SLP recommending dysphagia 3, thin Noted intermittent cough, si/sx aspiration -> he declined MBS with SLP  Will continue to monitor off tube feeds A1c 5.6, trend BG's   Acute to Subacute Nondisplaced Fx of the Proximal Metaphysis of the Proximal Phalanx of the Second Toe WBAT in hard sole shoe per orthopedics Outpatient follow up    Severe protein calorie malnutrition POA - Continue  protein supp - Continue MVM, folate, thiamine   Hx PVD s/p left BKA.  Doesn't appear to be on any meds - No critical limb ischemia. Follow outpatient    Hypertension: BP elevated - Restart home norvasc. If still elevated, restart home lisinopril.   Patrecia Pour, MD [facility] [email or web address] 09/16/2022, 1:27 PM

## 2022-09-16 NOTE — TOC Progression Note (Signed)
Transition of Care [facility]) - Progression Note    Patient Details  Name: Ronald [name] MRN: [id number] Date of Birth: 05/26/1956  Transition of Care [facility]) CM/SW Contact  Royston Bake, RN, MHA,CCM Transitional of Care Supervisor Phone Number:[phone] 09/16/2022, 3:44 PM  Clinical Narrative:    Talked to patient at the bedside, he is requesting to return home. Patient was active with [facility] and is agreeable to have them again. [name] with [facility] called - they are able to accept the patient. [employer] called for wheelchair for home. Telephone call to patient's sister [name] - with the patient at the bedside. Patient informed family member that he plans to return home at discharge. Family expressed their disapproval. Patient also stated that his front door was locked and his key was inside. Working on obtaining a locksmith to be present when the patient arrives at his home to unlock the door. DC meds to be delivered to the bedside at discharge.   Expected Discharge Plan: Home/Self Care Barriers to Discharge: Continued Medical Work up  Expected Discharge Plan and Services In-house Referral: Clinical Social Work Discharge Planning Services: CM Consult Post Acute Care Choice: [place] arrangements for the past 2 months: Apartment                           HH Arranged: PT, OT, Social Work [employer] Agency:  ([place]) Date [place]: 09/16/22   Representative spoke with at [place]: Hoyle Sauer with [facility]   Social Determinants of Health ([place]) Interventions SDOH Screenings   Tobacco Use: High Risk (04/15/2022)    Readmission Risk Interventions     No data to display

## 2022-09-16 NOTE — Progress Notes (Signed)
PT Cancellation Note  Patient Details Name: Ronald [name] MRN: [id number] DOB: 1956/07/02   Cancelled Treatment:    Reason Eval/Treat Not Completed: Other (comment). Pt and family along with RN currently having a debate in regards to d/c planning. RN suggested PT hold off for now. Will plan to follow-up tomorrow as this PT will likely not have time to follow-up again later before the end of the work day.   Moishe Spice, PT, DPT Acute Rehabilitation Services  Office: [place] 09/16/2022, 4:49 PM

## 2022-09-16 NOTE — Progress Notes (Signed)
RN received a phone call from daughter, listening to her complaints. All 3 sisters arrived to the unit around 39 expressing their concerns and questions. MD Bonner Puna, CSW Milas Gain, and CM Olga Coaster notified, requesting a family meeting to talk. All concerns and questions have been answered to the best of this RN's ability.

## 2022-09-16 NOTE — TOC Progression Note (Signed)
Transition of Care [facility]) - Progression Note    Patient Details  Name: Ronald [name] MRN: [id number] Date of Birth: 1955-09-15  Transition of Care [facility]) CM/SW [place], [place] Phone Number: 09/16/2022, 5:30 PM  Clinical Narrative:     CSW met with pt and family at bedside to provide ALF resources. Pt and sisters have questions about pt dc home, sisters continue to state they don't feel home is safe for pt. CSW asked pt directly where he wanted to go, pt states "home", sisters state pt means anywhere but the hospital, CSW asked pt again and he stated his numerical address. Pt's sisters are asking about ALFs, CSW provided the list and advised that pt would need to be interviewed and appropriate for placement at ALF. During conversation, pt's sister Ronald [name] made comments about possible drug use, drinking, and smoking cigarettes. CSW did advise that pt would have an issue with ALF placement with those behaviors, pt would have to be agreeable to placement and cannot be forced nor can he be forced to give up check. Resources left at bedside, TOC will continue to follow for disposition.   Expected Discharge Plan: Home/Self Care Barriers to Discharge: Continued Medical Work up  Expected Discharge Plan and Services In-house Referral: Clinical Social Work Discharge Planning Services: CM Consult Post Acute Care Choice: [place] arrangements for the past 2 months: Apartment                           HH Arranged: PT, OT, Social Work [employer] Agency:  ([place]) Date [place]: 09/16/22   Representative spoke with at [place]: Hoyle Sauer with [facility]   Social Determinants of Health ([place]) Interventions SDOH Screenings   Tobacco Use: High Risk (04/15/2022)    Readmission Risk Interventions     No data to display

## 2022-09-16 NOTE — TOC Progression Note (Signed)
Transition of Care [facility]) - Progression Note    Patient Details  Name: [name] MRN: [id number] Date of Birth: [birth year]  Transition of Care [facility]) CM/SW Contact  Graves-Bigelow, Ocie Cornfield, RN Phone Number: 09/16/2022, 12:21 PM  Clinical Narrative: Late Entry: Case Manager received a call from the sister [name] this morning regarding disposition plan. Sister states the patient is agreeable to an ALF and she asked for [facility]. Case Manger explained to sister that [facility] is a Event organiser and the patient does not have the Medicare Benefit for SNF. Case Manager explained that per MD notes patient is medically stable to transition from the hospital. Case Manager stated to the sister that collectively as a family the family will need to assist the patient in getting to an ALF exploring the location, cost, and if he can afford. Case Manager discussed that in the alternate patient may benefit from staying with one of the sisters vs staying in a motel- hotel until he can get to an ALF since the home is uninhabitable.   1100 09-16-22 Late Entry- Case Manager received a call from the sister Rosetta. Case Manager attempted to explain that the writer tried to call her on 09-14-22 and was unable to do so, heard loud noises and was able to three-way the other two sisters [name] and [employer]. Rosetta wanted to talk loud to the Case Manager and speak over the Case Manager; therefore, call was ended. Case Manager alerted the Transition of Care Supervisor Olga Coaster to reach out to the sister regarding disposition plan. CSW to provide resources for ALF. No further needs from this Case Manager at this time.    Expected Discharge Plan: Home/Self Care Barriers to Discharge: Continued Medical Work up  Expected Discharge Plan and [place] Choice: [place] Living arrangements for the past 2 months: Apartment    Social Determinants of Health (SDOH)  Interventions SDOH Screenings   Tobacco Use: High Risk (04/15/2022)    Readmission Risk Interventions     No data to display

## 2022-09-17 ENCOUNTER — Other Ambulatory Visit (HOSPITAL_COMMUNITY): Payer: Self-pay

## 2022-09-17 DIAGNOSIS — J189 Pneumonia, unspecified organism: Secondary | ICD-10-CM | POA: Diagnosis not present

## 2022-09-17 DIAGNOSIS — J9600 Acute respiratory failure, unspecified whether with hypoxia or hypercapnia: Secondary | ICD-10-CM | POA: Diagnosis not present

## 2022-09-17 DIAGNOSIS — R0602 Shortness of breath: Secondary | ICD-10-CM | POA: Diagnosis not present

## 2022-09-17 MED ORDER — AMLODIPINE BESYLATE 10 MG PO TABS
10.0000 mg | ORAL_TABLET | Freq: Every day | ORAL | 0 refills | Status: DC
  Filled 2022-09-17: qty 30, 30d supply, fill #0

## 2022-09-17 MED ORDER — THIAMINE HCL 100 MG PO TABS
100.0000 mg | ORAL_TABLET | Freq: Every day | ORAL | 0 refills | Status: AC
  Filled 2022-09-17: qty 30, 30d supply, fill #0

## 2022-09-17 MED ORDER — ALBUTEROL SULFATE HFA 108 (90 BASE) MCG/ACT IN AERS
2.0000 | INHALATION_SPRAY | Freq: Four times a day (QID) | RESPIRATORY_TRACT | 0 refills | Status: DC | PRN
  Filled 2022-09-17: qty 18, 25d supply, fill #0

## 2022-09-17 MED ORDER — FLUTICASONE FUROATE-VILANTEROL 100-25 MCG/ACT IN AEPB
1.0000 | INHALATION_SPRAY | Freq: Every day | RESPIRATORY_TRACT | 0 refills | Status: DC
  Filled 2022-09-17: qty 12, 15d supply, fill #0
  Filled 2022-09-17: qty 60, 30d supply, fill #0

## 2022-09-17 MED ORDER — LISINOPRIL 10 MG PO TABS
10.0000 mg | ORAL_TABLET | Freq: Every day | ORAL | 0 refills | Status: DC
  Filled 2022-09-17 – 2022-10-10 (×2): qty 30, 30d supply, fill #0

## 2022-09-17 NOTE — Progress Notes (Signed)
Discharge teaching provided. Meds, diet, activity, follow up appointments reviewed and all questions answered. Copy of instructions given to patient and meds brought to bedside by [facility] pharmacy. Transportation will be here at 7:15 to pick up patient and locksmith will meet him at his house at 7:30 per social work. Sisters are aware.

## 2022-09-17 NOTE — TOC Transition Note (Signed)
Transition of Care [facility]) - CM/SW Discharge Note   Patient Details  Name: [name] MRN: [id number] Date of Birth: 12-28-1955  Transition of Care [facility]) CM/SW Contact:  Milas Gain, [place] Phone Number: 09/17/2022, 3:27 PM   Clinical Narrative:     Patient will DC to: home address-[address] [place] [address]   Anticipated DC date: 09/17/2022  Family notified: Patient declined. Patient reports his sisters already know.  Transport by: [facility] transportation    [place] signing off.    Final next level of care: Home/Self Care Barriers to Discharge: Continued Medical Work up   Patient Goals and CMS Choice   Choice offered to / list presented to : Sibling  Discharge Placement                         Discharge Plan and Services Additional resources added to the After Visit Summary for   In-house Referral: Clinical Social Work Discharge Planning Services: CM Consult Post Acute Care Choice: Skilled Nursing Facility                    HH Arranged: PT, OT, Social Work [place] Agency:  ([facility]adalupe Levasy) Date [place]: 09/16/22   Representative spoke with at [place]: Hoyle Sauer with [facility]  Social Determinants of Health ([place]) Interventions Comfort: Food Insecurity Present (09/17/2022)  Housing: Medium Risk (09/17/2022)  Transportation Needs: Unmet Transportation Needs (09/17/2022)  Utilities: Not At Risk (09/17/2022)  Tobacco Use: High Risk (04/15/2022)     Readmission Risk Interventions     No data to display

## 2022-09-17 NOTE — Care Management Important Message (Signed)
Important Message  Patient Details  Name: Manfred Laspina MRN: [id number] Date of Birth: [birth year]   Medicare Important Message Given:  Yes     Shelda Altes 09/17/2022, 11:23 AM

## 2022-09-17 NOTE — Progress Notes (Signed)
Physical Therapy Treatment Patient Details Name: Ronald [name] MRN: [id number] DOB: 03/14/1956 Today's Date: 09/17/2022   History of Present Illness Patient is a 67 y.o. Male with a PMH of COPD, ETOH and substance abuse, Left BKA, Smoker, who presented to the ED with hypoxia. Required intubation due to worsening respiratory status. Extubated 12/25.    PT Comments    Focused session on bed mobility and transfer training. Pt demonstrating increased difficulty pushing up to ascend his trunk to sit up EOB due to noted UE weakness, thus instructed pt to perform tricep pushups from chair using armrests to increase his triceps strength. Pt required minA to safely transfer bed <> chair when there were armrests to clear. He likely could scoot transfer though with min guard assist if the armrests could be removed. Will continue to follow acutely.     Recommendations for follow up therapy are one component of a multi-disciplinary discharge planning process, led by the attending physician.  Recommendations may be updated based on patient status, additional functional criteria and insurance authorization.  Follow Up Recommendations  Home health PT (& [place] services; unable to go to SNF due to insurance) Can patient physically be transported by private vehicle: Yes   Assistance Recommended at Discharge Frequent or constant Supervision/Assistance  Patient can return home with the following Assistance with cooking/housework;Direct supervision/assist for financial management;Direct supervision/assist for medications management;Assist for transportation;A little help with walking and/or transfers;A little help with bathing/dressing/bathroom   Equipment Recommendations  Wheelchair (measurements PT);Wheelchair cushion (measurements PT)    Recommendations for Other Services       Precautions / Restrictions Precautions Precautions: Fall Precaution Comments: hx of L BKA (does not use  prosthesis) Restrictions Weight Bearing Restrictions: No RLE Weight Bearing: Weight bearing as tolerated     Mobility  Bed Mobility Overal bed mobility: Needs Assistance Bed Mobility: Supine to Sit, Sit to Supine     Supine to sit: Min guard Sit to supine: Supervision   General bed mobility comments: Extra effort and cues needed to pull on R bed rail with L UE to roll onto R elbow to ascend trunk to sit up, min guard for safety. Supervision for return to supine. Bed flat    Transfers Overall transfer level: Needs assistance Equipment used: None Transfers: Bed to chair/wheelchair/BSC       Squat pivot transfers: Min assist    Lateral/Scoot Transfers: Min guard General transfer comment: Pt needing minA to steady and safely direct pt's buttocks bed <> standard chair with armrests. Min guard to scoot along EOB    Ambulation/Gait               General Gait Details: unable   Stairs             Wheelchair Mobility    Modified Rankin (Stroke Patients Only)       Balance Overall balance assessment: Needs assistance Sitting-balance support: Feet supported, No upper extremity supported Sitting balance-Leahy Scale: Good                                      Cognition Arousal/Alertness: Awake/alert Behavior During Therapy: WFL for tasks assessed/performed Overall Cognitive Status: Impaired/Different from baseline Area of Impairment: Memory, Safety/judgement, Awareness, Problem solving                     Memory: Decreased short-term memory   Safety/Judgement: Decreased awareness of safety,  Decreased awareness of deficits Awareness: Emergent Problem Solving: Requires verbal cues, Slow processing General Comments: Pt reporting he is planning to have a NT come out to help him at home. Unsure of pt's safety and deficits awareness        Exercises General Exercises - Lower Extremity Long Arc Quad: AROM, Strengthening, Both, 10  reps, Seated Other Exercises Other Exercises: tricep push ups from armrests of chair, 10x    General Comments        Pertinent Vitals/Pain Pain Assessment Pain Assessment: Faces Faces Pain Scale: Hurts little more Pain Location: R leg Pain Descriptors / Indicators: Discomfort, Grimacing, Guarding Pain Intervention(s): Monitored during session, Limited activity within patient's tolerance, Repositioned    Home Living                          Prior Function            PT Goals (current goals can now be found in the care plan section) Acute Rehab PT Goals Patient Stated Goal: to get stronger and go home PT Goal Formulation: With patient Time For Goal Achievement: 09/28/22 Potential to Achieve Goals: Good Progress towards PT goals: Progressing toward goals    Frequency    Min 3X/week      PT Plan Equipment recommendations need to be updated    Co-evaluation              AM-PAC PT "6 Clicks" Mobility   Outcome Measure  Help needed turning from your back to your side while in a flat bed without using bedrails?: None Help needed moving from lying on your back to sitting on the side of a flat bed without using bedrails?: A Little Help needed moving to and from a bed to a chair (including a wheelchair)?: A Little Help needed standing up from a chair using your arms (e.g., wheelchair or bedside chair)?: Total Help needed to walk in hospital room?: Total Help needed climbing 3-5 steps with a railing? : Total 6 Click Score: 13    End of Session   Activity Tolerance: Patient tolerated treatment well Patient left: with call bell/phone within reach;in bed;with bed alarm set   PT Visit Diagnosis: Other abnormalities of gait and mobility (R26.89);Muscle weakness (generalized) (M62.81)     Time: 7782-4235 PT Time Calculation (min) (ACUTE ONLY): 11 min  Charges:  $Therapeutic Activity: 8-22 mins                     Moishe Spice, PT, DPT Acute  Rehabilitation Services  Office: [place] 09/17/2022, 10:44 AM

## 2022-09-17 NOTE — TOC Progression Note (Addendum)
Transition of Care [facility]) - Progression Note    Patient Details  Name: Ronald [name] MRN: [id number] Date of Birth: 1956-07-18  Transition of Care [facility]) CM/SW [place], [place] Phone Number: 09/17/2022, 2:40 PM  Clinical Narrative:     Update- [facility] called CSW back and confirmed they will be here to pick patient up between 7:00pm-7:15pm. [facility] confirmed they should arrive at patients home around 7:30pm no later than 7:45pm.CSW called original lock smith who was no longer able to assist. CSW called [employer]. CSW spoke with Randall Hiss who confirmed he will be able to arrive at patients home at 7:30pm. CSW informed patient and RN.   CSW informed plan for patient to return home. CSW called [facility] transportation to schedule pick up to transport patient home. CSW awaiting call back from driver with time for pick up and drop off . Once drop off time is confirmed CSW will then schedule lock smith that can meet patient at his home to help get patient back into his home. Patient has wheelchair that has been delivered to his room. CSW will continue to follow.  Expected Discharge Plan: Home/Self Care Barriers to Discharge: Continued Medical Work up  Expected Discharge Plan and Services In-house Referral: Clinical Social Work Discharge Planning Services: CM Consult Post Acute Care Choice: [place] Living arrangements for the past 2 months: Apartment Expected Discharge Date: 09/17/22                         HH Arranged: PT, OT, Social Work [place] Agency:  ([place]) Date [place]: 09/16/22   Representative spoke with at [place]: Hoyle Sauer with [facility]   Social Determinants of Health ([place]) Interventions SDOH Screenings   Tobacco Use: High Risk (04/15/2022)    Readmission Risk Interventions     No data to display

## 2022-09-17 NOTE — Consult Note (Addendum)
[place] [facility] New Face-to-Face Psychiatric Evaluation  Service Date: September 17, 2022 LOS:  LOS: 15 days   Assessment  Ronald [name] is a 67 y.o. male admitted medically for 09/02/2022 12:05 PM for acute on chronic hypoxemic and hypercapneic respiratory failure secondary to COPD exacerbation. He has a past medical history of COPD, ETOH and substance abuse, and s/p left BKA . Consult / Liaison Psychiatry was consulted for capacity evaluation to refuse SNF / ALF by Dr. Vance Gather  from the [facility] service.  Brief Capacity Evaluation Patient was alert, awake. Oriented to self, place, time, and situation.  The four component model of decisional capacity in the healthcare setting outlined in an article by Bea Graff and Appelbaum (https://www.nejm.org/doi/full/10.1056/nejmcp074045) is the gold standard for assessing patients' capacity to make decisions related to their medical care   Communication of a choice. Patient is able to express the choice of wanting to wait to decide whether he will go to an assisted living facility once he can talk to family and the medical team or to go home with resources if ALF is not a desirable option. The patient has met this criteria. Demonstrate understanding of the relevant information. Patient is able to paraphrase in his own words, "I don't want to make a decision until I understand what it (an assisted living facility) is." The patient has met this criteria. Appreciation of patient's own situation and its consequences ("insight"). Patient is able to appropriately describe the reality of his medical condition, as well as weigh his options about staying in his house vs living somewhere else. The patient has met this criteria. Reason through relevant information. Patient is able to compare treatment options and consequences, and reasoning through his selection of options, stating, "I know if I stay at my house I'm just gonna do what I did before.  I know I can fall and hurt myself. I want to wait until the meeting with my family to decide about assisted living facility." The patient has met this criteria  Based on my evaluation of the above criteria, the patient has met the full standard for medical decision-making capacity.  Camelia Phenes, MD [place] [facility] Intern (PGY-1) 09/17/2022 12:47 PM

## 2022-09-17 NOTE — Progress Notes (Signed)
Occupational Therapy Treatment Patient Details Name: [name] MRN: [id number] DOB: 15-Dec-1955 Today's Date: 09/17/2022   History of present illness Patient is a 67 y.o. Male with a PMH of COPD, ETOH and substance abuse, Left BKA, Smoker, who presented to the ED with hypoxia. Required intubation due to worsening respiratory status. Extubated 12/25.   OT comments  Pt in bed upon therapy arrival and agreeable to participate in OT evaluation. Session focused on bed mobility, sitting balance, and core and UE strengthening utilizing red theraband. Therapist provided verbal instruction and visual demonstration for proper form and technique during exercises. Pt was able to demonstrate understanding with minimal need for form correction. Was able to maintain proper form when provided correction. No assistance needed during bed mobility. Acute OT will continue to follow patient acutely.   Recommendations for follow up therapy are one component of a multi-disciplinary discharge planning process, led by the attending physician.  Recommendations may be updated based on patient status, additional functional criteria and insurance authorization.    Follow Up Recommendations  Home health OT     Assistance Recommended at Discharge PRN  Patient can return home with the following  Assistance with cooking/housework;Assist for transportation;Help with stairs or ramp for entrance;A little help with walking and/or transfers;A little help with bathing/dressing/bathroom   Equipment Recommendations  Tub/shower seat       Precautions / Restrictions Precautions Precautions: Fall Precaution Comments: 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) Restrictions Weight Bearing Restrictions: No RLE Weight Bearing: Weight bearing as tolerated Other Position/Activity Restrictions: in hard sole shoe per ortho       Mobility Bed Mobility Overal bed mobility: Modified Independent Bed Mobility: Supine to Sit, Sit to  Supine     Supine to sit: Modified independent (Device/Increase time) Sit to supine: Modified independent (Device/Increase time)   General bed mobility comments: Utilized bed railings to help transition during bed mobility.       Balance Overall balance assessment: No apparent balance deficits (not formally assessed) Sitting-balance support: Feet unsupported, No upper extremity supported Sitting balance-Leahy Scale: Good Sitting balance - Comments: Sitting EOB to complete UB strengthening exercises.             ADL either performed or assessed with clinical judgement      Cognition Arousal/Alertness: Awake/alert Behavior During Therapy: WFL for tasks assessed/performed Overall Cognitive Status: Within Functional Limits for tasks assessed          Exercises General Exercises - Upper Extremity Shoulder Horizontal ABduction: Strengthening, Both, 10 reps, Seated, Theraband Theraband Level (Shoulder Horizontal Abduction): Level 2 (Red) Shoulder Horizontal ADduction: Strengthening, Both, 10 reps, Seated, Theraband Theraband Level (Shoulder Horizontal Adduction): Level 2 (Red) Elbow Flexion: Strengthening, Both, 10 reps, Seated, Theraband Theraband Level (Elbow Flexion): Level 2 (Red) Elbow Extension: Strengthening, Both, 10 reps, Seated, Theraband Theraband Level (Elbow Extension): Level 2 (Red) Shoulder Exercises Shoulder External Rotation: Strengthening, Both, 10 reps, Seated, Theraband Theraband Level (Shoulder External Rotation): Level 2 (Red)            Pertinent Vitals/ Pain       Pain Assessment Pain Assessment: No/denies pain Pain Score: 0-No pain         Frequency  Min 2X/week        Progress Toward Goals  OT Goals(current goals can now be found in the care plan section)  Progress towards OT goals: Progressing toward goals     Plan Frequency remains appropriate;Discharge plan remains appropriate  AM-PAC OT "6 Clicks" Daily Activity      Outcome Measure   Help from another person eating meals?: None Help from another person taking care of personal grooming?: None Help from another person toileting, which includes using toliet, bedpan, or urinal?: A Little Help from another person bathing (including washing, rinsing, drying)?: A Little Help from another person to put on and taking off regular upper body clothing?: None Help from another person to put on and taking off regular lower body clothing?: A Little 6 Click Score: 21    End of Session Equipment Utilized During Treatment: Gait belt  OT Visit Diagnosis: Unsteadiness on feet (R26.81);Other abnormalities of gait and mobility (R26.89);Muscle weakness (generalized) (M62.81)   Activity Tolerance Patient tolerated treatment well   Patient Left in bed;with call bell/phone within reach;with bed alarm set;with nursing/sitter in room           Time: 9390-3009 OT Time Calculation (min): 21 min  Charges: OT General Charges $OT Visit: 1 Visit OT Treatments $Therapeutic Exercise: 8-22 mins  Ailene Ravel, OTR/L,CBIS  Supplemental OT - [facility] and [facility] Secure Chat Preferred    Saraia Platner, [place] 09/17/2022, 3:05 PM

## 2022-09-17 NOTE — Discharge Summary (Signed)
Physician Discharge Summary   Patient: Ronald [name] MRN: [id number] DOB: November 26, 1955  Admit date:     09/02/2022  Discharge date: 09/17/22  Discharge Physician: Patrecia Pour   PCP: Demetrios Isaacs, FNP   Recommendations at discharge:  Follow up with PCP for ongoing care of COPD and HTN.  Follow up with orthopedics for subacute right 2nd toe fracture, WBAT in postop shoe. Continue discussions to encourage a higher level of care which the patient currently refuses.  Discharge Diagnoses: Principal Problem:   Acute respiratory failure ([place]) Active Problems:   Hyponatremia   ETOH abuse   COPD (chronic obstructive pulmonary disease) (HCC)   Acute hypercapnic respiratory failure (HCC)   Right bundle branch block   Acute encephalopathy   Hx of BKA, left (HCC)   Hypotension   Protein-calorie malnutrition, severe  Hospital Course: Ronald [name] is a 67 y.o. Male with a PMH of COPD, ETOH and substance abuse, Left BKA, Smoker, who presented to the ED with hypoxia. Per family, pt was supposed to have a CXR completed today per PCP, but it was closed and when the patient's daughter picked him up, she noticed his WOB was increasing worse than normal. On arrival, he was noted to be disoriented to date and having audible wheezing, SHOB, and cough with Spo2 of 87%.    EDP concerned for alcohol withdrawal seizure but was too somnolent for sedation without requiring intubation. Therefore, pt was started on BiPAP and then intubated d/t worsening respiratory status. ED workup reveled Na 129, Chloride 86, WBC 14.7. VBG 7.269/72.4/35/33.2. CXR with no acute findings. He failed BiPAP and required intubation 12/21. Treated for pneumonia and COPD exacerbation. Ultimately extubated 12/25 and has had improved, normalized respiratory status since then. Discharge delayed due to adverse living situation discussed below.    Assessment and Plan: Disposition: Patient remains medically stable to leave the hospital  once a safe venue is secured. CM has worked diligently and we've arranged maximal home health therapies and DME. We are paying for a locksmith to get him back in his house. His sisters have concerns about his living situation/house in disrepair. APS has been alerted of this again (also evaluated back in August), has no current input to keep him from going home. Home health agency that worked with him in August is willing to come back to the same place now. We have recommended SNF, though he does not have insurance coverage for this. SNF and ALF would both be recommended, though the patient refuses to give up SS check and feels he'll be safe going home. The medical team felt that the patient had the mental capacity to make that medical decision at the time of discharge. This was corroborated by psychiatry evaluation as well.  The patient is at exceedingly high risk of readmission or poor health outcome, though we are unable to mitigate this risk any further. He certainly has maximized the benefit of inpatient management, so is discharged in stable condition.   Acute on chronic hypoxemic and hypercapneic respiratory failure COPD Exacerbation: Has completed antibiotics and prolonged steroid taper, remains without hypoxemia at this time. CXR with improved aeration of the lung bases with coarsened interstitial markings. Suggest Pulmonary follow up. Prescribed formulary breo ellipta and prn albuterol at discharge, provided him meds by [facility] pharmacy prior to leaving.    Acute Metabolic Encephalopathy Concern for Seizure Like Activity EEG with moderate to severe diffuse encephalopathy at time of recording.  Suspect related to etoh withdrawal, hypercarbia, ICU  delirium, ? Etoh dementia B12, folate, TSH -> wnl Ammonia is wnl. Head CT 12/21 without acute intracranial abnormality - Continue thiamine supplement.  - This has resolved.  EtOH dependence Phenobarbital taper complete. No ongoing withdrawal   Encourage cessation   Dysphagia  Hypoglycemia: He declined MBS with SLP though is tolerating regular diet per his wishes without overt evidence of aspiration.  A1c 5.6%, no hypoglycemia since tolerating diet.   Acute to Subacute Nondisplaced Fx of the Proximal Metaphysis of the Proximal Phalanx of the Second Toe - WBAT in hard sole shoe per orthopedics - Outpatient follow up    Severe protein calorie malnutrition POA - Continue protein and vitamin supplementation   Hx PVD s/p left BKA.  Doesn't appear to be on any meds - No critical limb ischemia. Follow outpatient    Hypertension:   - Restart home meds (new prescriptions provided at discharge)  Consultants: PCCM Procedures performed: Intubation  Disposition: Home Diet recommendation: Regular DISCHARGE MEDICATION: Allergies as of 09/17/2022   No Known Allergies      Medication List     STOP taking these medications    cetirizine 10 MG tablet Commonly known as: ZYRTEC   fluticasone 50 MCG/ACT nasal spray Commonly known as: FLONASE   guaiFENesin 600 MG 12 hr tablet Commonly known as: MUCINEX   predniSONE 20 MG tablet Commonly known as: DELTASONE       TAKE these medications    Advair HFA 230-21 MCG/ACT inhaler Generic drug: fluticasone-salmeterol Inhale 2 puffs into the lungs 2 (two) times daily.   Advil 200 MG tablet Generic drug: ibuprofen Take 200 mg by mouth as needed for mild pain.   albuterol 108 (90 Base) MCG/ACT inhaler Commonly known as: VENTOLIN HFA Inhale 2 puffs into the lungs every 6 (six) hours as needed for wheezing or shortness of breath. What changed: when to take this   amLODipine 10 MG tablet Commonly known as: NORVASC Take 1 tablet (10 mg total) by mouth daily.   folic acid 1 MG tablet Commonly known as: FOLVITE Take 1 tablet (1 mg total) by mouth daily.   lisinopril 10 MG tablet Commonly known as: ZESTRIL Take 1 tablet (10 mg total) by mouth daily.   thiamine 100 MG  tablet Commonly known as: VITAMIN B1 Take 1 tablet (100 mg total) by mouth daily.               Durable Medical Equipment  (From admission, onward)           Start     Ordered   09/16/22 1644  For home use only DME lightweight manual wheelchair with seat cushion  Once       Comments: Patient suffers from COPD, left BKA which impairs their ability to perform daily activities like bathing, dressing, walking, grooming in the home.  A cane, crutch, or walker will not resolve  issue with performing activities of daily living. A wheelchair will allow patient to safely perform daily activities. Patient is not able to propel themselves in the home using a standard weight wheelchair due to endurance and general weakness. Patient can self propel in the lightweight wheelchair. Length of need Lifetime. Accessories: elevating leg rests (ELRs), wheel locks, extensions and anti-tippers.   09/16/22 1648            Contact information for follow-up providers     [facility] Follow up.   Contact information: [address] [place], [place] telephone number [phone]  Demetrios Isaacs, FNP Follow up.   Specialty: Family Medicine Contact information: [address] [address] [phone]              Contact information for after-discharge care     [facility] .   Service: Skilled Nursing Contact information: [address] [place] [phone]                    Discharge Exam: Filed Weights   09/15/22 0434 09/16/22 0500 09/17/22 0500  Weight: 56.7 kg 57.3 kg 56.8 kg  BP 104/62 (BP Location: Right Arm)   Pulse (!) 104   Temp 98.4 F (36.9 C) (Oral)   Resp 16   Ht 5\' 4"  (1.626 m)   Wt 56.8 kg   SpO2 95%   BMI 21.49 kg/m   Chronically ill-appearing but nontoxic male in no distress Nonlabored, clear RRR, no pitting edema Alert, oriented, conversant L  AKA  Condition at discharge: stable  The results of significant diagnostics from this hospitalization (including imaging, microbiology, ancillary and laboratory) are listed below for reference.   Imaging Studies: DG Foot 2 Views Right  Result Date: 09/11/2022 CLINICAL DATA:  foot pain. EXAM: RIGHT FOOT - 2 VIEW COMPARISON:  Right foot radiographs 11/06/2013 FINDINGS: Moderate great toe metatarsophalangeal joint space narrowing, subchondral cystic changes, and medial peripheral degenerative osteophytes are mildly worsened from prior. Chronic ossicle is again seen just proximal to the great toe metatarsal sesamoids. There is a new curvilinear lucency at the proximal metaphysis of the proximal phalanx of the second toe, likely an acute to subacute fracture. Increased spurring at the medial base of the proximal phalanx. Mild-to-moderate chronic enthesopathic changes at the Achilles insertion on the calcaneus. Mild to moderate dorsal talonavicular and tarsometatarsal degenerative osteophytes on lateral view. IMPRESSION: 1. Moderate great toe metatarsophalangeal joint osteoarthritis, mildly worsened from prior. 2. New acute to subacute nondisplaced fracture of the proximal metaphysis of the proximal phalanx of the second toe. Electronically Signed   By: Yvonne Kendall M.D.   On: 09/11/2022 20:02   DG CHEST PORT 1 VIEW  Result Date: 09/10/2022 CLINICAL DATA:  Cough EXAM: PORTABLE CHEST 1 VIEW COMPARISON:  08/16/2022 FINDINGS: Interval removal of endotracheal and enteric tubes. Heart size is normal. Aortic atherosclerosis. Improved aeration of the lung bases with persistently coarsened interstitial markings. No new airspace consolidation. No pleural effusion or pneumothorax. IMPRESSION: Improved aeration of the lung bases with persistently coarsened interstitial markings. No new airspace consolidation. Electronically Signed   By: Davina Poke D.O.   On: 09/10/2022 14:53   DG Abd Portable 1V  Result  Date: 09/08/2022 CLINICAL DATA:  Feeding tube placement. EXAM: PORTABLE ABDOMEN - 1 VIEW COMPARISON:  September 05, 2022. FINDINGS: Distal tip of feeding tube is seen in proximal stomach. IMPRESSION: Distal tip of feeding tube is seen in proximal stomach. Electronically Signed   By: Marijo Conception M.D.   On: 09/08/2022 14:34   EEG adult  Result Date: 09/07/2022 Lora Havens, MD     09/07/2022 12:33 PM Patient Name: Cassandra Mcmanaman MRN: [id number] Epilepsy Attending: Lora Havens Referring Physician/Provider: Candee Furbish, MD Date: 09/07/2022 Duration: 21.44 mins Patient history: 67 year old male with seizure-like activity.  EEG to evaluate for seizure. Level of alertness: lethargic AEDs during EEG study: Phenobarb Technical aspects: This EEG study was done with scalp electrodes positioned according to the 10-20 International system of electrode placement.  Electrical activity was reviewed with band pass filter of 1-70Hz , sensitivity of 7 uV/mm, display speed of 2230mm/sec with a 60Hz  notched filter applied as appropriate. EEG data were recorded continuously and digitally stored.  Video monitoring was available and reviewed as appropriate. Description: EEG showed continuous generalized  3 to 6 Hz theta-delta slowing admixed with an excessive amount of 15 to 18 Hz beta activity distributed symmetrically and diffusely.  Hyperventilation and photic stimulation were not performed.   ABNORMALITY - Continuous slow, generalized - Excessive beta, generalized IMPRESSION: This study is suggestive of moderate to severe diffuse encephalopathy, nonspecific etiology. The excessive beta activity seen in the background is most likely due to the effect of medications like phenobarbital and is a benign EEG pattern. No seizures or epileptiform discharges were seen throughout the recording. Charlsie QuestPriyanka O Yadav   DG Abd Portable 1V  Result Date: 09/05/2022 CLINICAL DATA:  Encounter for OG tube placement EXAM: PORTABLE  ABDOMEN - 1 VIEW COMPARISON:  09/05/2022 FINDINGS: The enteric tube tip and side port are below the GE junction within the gastric fundus. No dilated bowel loops identified. IMPRESSION: Enteric tube tip and side port are below the GE junction within the gastric fundus. Electronically Signed   By: Signa Kellaylor  Stroud M.D.   On: 09/05/2022 10:47   DG CHEST PORT 1 VIEW  Result Date: 09/05/2022 CLINICAL DATA:  [id number] Aspiration into airway [id number] EXAM: PORTABLE CHEST 1 VIEW COMPARISON:  September 03, 2022 FINDINGS: The cardiomediastinal silhouette is unchanged in contour. Atherosclerotic calcifications. ETT tip terminates approximately 2.4 cm above the carina. Enteric tube tip and side port project over the proximal stomach. No pleural effusion. No pneumothorax. Bibasilar reticulonodular opacities, similar in comparison to prior. IMPRESSION: 1. Support apparatus as described above. 2. Similar appearance of bibasilar reticulonodular opacities. Electronically Signed   By: Meda KlinefelterStephanie  Peacock M.D.   On: 09/05/2022 10:45   DG Abd Portable 1V  Result Date: 09/05/2022 CLINICAL DATA:  [id number] Encounter for orogastric (OG) tube placement [id number] EXAM: PORTABLE ABDOMEN - 1 VIEW COMPARISON:  September 03, 2022 FINDINGS: Incomplete visualization of the pelvis. Enteric tube side port projects over the distal esophagus. No dilated loops of bowel are visualized. Bronchial wall markings with bibasilar reticular nodularity. IMPRESSION: Enteric tube side port projects over the distal esophagus. Recommend advancement. Electronically Signed   By: Meda KlinefelterStephanie  Peacock M.D.   On: 09/05/2022 09:11   ECHOCARDIOGRAM COMPLETE  Result Date: 09/03/2022    ECHOCARDIOGRAM REPORT   Patient Name:   Dawsen Ouch Date of Exam: 09/03/2022 Medical Rec #:  [id number]       Height:       64.0 in Accession #:    [id number]      Weight:       114.0 lb Date of Birth:  1956-01-04       BSA:          1.541 m Patient Age:    66 years        BP:            101/63 mmHg Patient Gender: M               HR:           92 bpm. Exam Location:  Inpatient Procedure: 2D Echo, Color Doppler and Cardiac Doppler Indications:    [place] respiratory distress R06.03  History:        Patient has prior history of Echocardiogram examinations, most  recent 03/19/2019. Arrythmias:RBBB, Signs/Symptoms:Hypotension and                 Dyspnea; Risk Factors:Hypertension.  Sonographer:    Aron Baba Referring Phys: [id number] PETER E BABCOCK  Sonographer Comments: Echo performed with patient supine and on artificial respirator. Image acquisition challenging due to COPD and Image acquisition challenging due to respiratory motion. IMPRESSIONS  1. Left ventricular ejection fraction, by estimation, is 65 to 70%. The left ventricle has normal function. The left ventricle has no regional wall motion abnormalities. There is mild left ventricular hypertrophy. Left ventricular diastolic parameters are consistent with Grade I diastolic dysfunction (impaired relaxation).  2. Right ventricular systolic function is normal. The right ventricular size is normal. There is normal pulmonary artery systolic pressure.  3. Trivial mitral valve regurgitation.  4. Aortic valve regurgitation is not visualized.  5. Aneurysm of the ascending aorta, measuring 41 mm.  6. The inferior vena cava is dilated in size with <50% respiratory variability, suggesting right atrial pressure of 15 mmHg. FINDINGS  Left Ventricle: Left ventricular ejection fraction, by estimation, is 65 to 70%. The left ventricle has normal function. The left ventricle has no regional wall motion abnormalities. The left ventricular internal cavity size was normal in size. There is  mild left ventricular hypertrophy. Left ventricular diastolic parameters are consistent with Grade I diastolic dysfunction (impaired relaxation). Right Ventricle: The right ventricular size is normal. Right ventricular systolic function is normal. There is normal  pulmonary artery systolic pressure. The tricuspid regurgitant velocity is 1.15 m/s, and with an assumed right atrial pressure of 15 mmHg, the estimated right ventricular systolic pressure is 20.3 mmHg. Left Atrium: Left atrial size was normal in size. Right Atrium: Right atrial size was normal in size. Pericardium: There is no evidence of pericardial effusion. Mitral Valve: Trivial mitral valve regurgitation. Tricuspid Valve: Tricuspid valve regurgitation is trivial. Aortic Valve: Aortic valve regurgitation is not visualized. Pulmonic Valve: Pulmonic valve regurgitation is trivial. Aorta: There is an aneurysm involving the ascending aorta measuring 41 mm. Venous: The inferior vena cava is dilated in size with less than 50% respiratory variability, suggesting right atrial pressure of 15 mmHg. IAS/Shunts: No atrial level shunt detected by color flow Doppler.  LEFT VENTRICLE PLAX 2D LVIDd:         3.70 cm Diastology LVIDs:         2.10 cm LV e' medial:    6.85 cm/s LV PW:         0.90 cm LV E/e' medial:  11.6 LV IVS:        1.30 cm LV e' lateral:   11.60 cm/s                        LV E/e' lateral: 6.9  RIGHT VENTRICLE RV S prime:     12.40 cm/s TAPSE (M-mode): 2.1 cm LEFT ATRIUM           Index        RIGHT ATRIUM           Index LA diam:      3.40 cm 2.21 cm/m   RA Area:     16.20 cm LA Vol (A2C): 12.0 ml 7.79 ml/m   RA Volume:   42.60 ml  27.65 ml/m LA Vol (A4C): 29.9 ml 19.41 ml/m  AORTIC VALVE             PULMONIC VALVE LVOT Vmax:   89.70 cm/s  PR End  Diast Vel: 5.02 msec LVOT Vmean:  57.500 cm/s LVOT VTI:    0.153 m  AORTA Ao Root diam: 3.50 cm Ao Asc diam:  4.10 cm MITRAL VALVE               TRICUSPID VALVE MV Area (PHT): 4.71 cm    TR Peak grad:   5.3 mmHg MV Decel Time: 161 msec    TR Vmax:        115.00 cm/s MR Peak grad: 32.4 mmHg MR Vmax:      284.50 cm/s  SHUNTS MV E velocity: 79.80 cm/s  Systemic VTI: 0.15 m MV A velocity: 92.70 cm/s MV E/A ratio:  0.86 [place] signed by Carolan Clines Signature Date/Time: 09/03/2022/2:31:37 PM    Final    DG Chest Port 1 View  Result Date: 09/03/2022 CLINICAL DATA:  Respiratory failure EXAM: PORTABLE CHEST 1 VIEW COMPARISON:  Yesterday FINDINGS: Endotracheal tube with tip between the clavicular heads and carina. An enteric tube reaches the stomach. Pulmonary opacity with indistinct density at the right base. Nodular density over the right mid chest. These findings were recently evaluated by CT. No effusion or pneumothorax. IMPRESSION: 1. Unremarkable hardware. 2. Stable right chest opacity as evaluated by CT yesterday. Electronically Signed   By: Tiburcio Pea M.D.   On: 09/03/2022 05:50   [place] EKG SITE RITE  Result Date: 09/03/2022 If [facility] image not attached, placement could not be confirmed due to current cardiac rhythm.  CT Angio Chest Pulmonary Embolism (PE) W or WO Contrast  Result Date: 09/02/2022 CLINICAL DATA:  Hypoxia EXAM: CT ANGIOGRAPHY CHEST WITH CONTRAST TECHNIQUE: Multidetector CT imaging of the chest was performed using the standard protocol during bolus administration of intravenous contrast. Multiplanar CT image reconstructions and MIPs were obtained to evaluate the vascular anatomy. RADIATION DOSE REDUCTION: This exam was performed according to the departmental dose-optimization program which includes automated exposure control, adjustment of the mA and/or kV according to patient size and/or use of iterative reconstruction technique. CONTRAST:  74mL OMNIPAQUE IOHEXOL 350 MG/ML SOLN COMPARISON:  Chest radiograph dated 09/02/2022 FINDINGS: Cardiovascular: The study is high quality for the evaluation of pulmonary embolism. There are no filling defects in the central, lobar, segmental or subsegmental pulmonary artery branches to suggest acute pulmonary embolism. Great vessels are normal in course and caliber. Normal heart size. No significant pericardial fluid/thickening. Coronary artery calcifications and aortic  atherosclerosis. Mediastinum/Nodes: Partially imaged thyroid gland without nodules meeting criteria for imaging follow-up by size. Normal esophagus. No pathologically enlarged axillary, supraclavicular, mediastinal, or hilar lymph nodes. Lungs/Pleura: ET tube terminates 1.9 cm above the carina. The central airways are patent. Mild upper lobe predominant centrilobular emphysema. Layering secretions within the bilateral bronchi extending into all lobes where there are subsegmental mucous plugs. Diffuse bronchial wall thickening. Right upper lobe nodule measuring up to 8 mm (8:50). Multifocal bilateral lower lobe and lingular ground-glass nodules. Right lower lobe consolidation. No pneumothorax. No pleural effusion. Upper abdomen: Enteric tube terminates stomach. Calcified hepatic granulomas. Musculoskeletal: No acute or abnormal lytic or blastic osseous lesions. Review of the MIP images confirms the above findings. IMPRESSION: 1. No acute pulmonary embolism. 2. Right lower lobe pneumonia. 3. Layering secretions within the bilateral bronchi extending into all lobes where there are subsegmental mucous plugs. Multifocal bilateral lower lobe and lingular ground-glass nodules. Findings are suspicious for superimposed aspiration. 4. Right upper lobe nodule measuring up to 8 mm, likely infectious/inflammatory. Non-contrast chest CT at 6-12 months is recommended. If  the nodule is stable at time of repeat CT, then future CT at 18-24 months (from today's scan) is considered optional for low-risk patients, but is recommended for high-risk patients. This recommendation follows the consensus statement: Guidelines for Management of Incidental Pulmonary Nodules Detected on CT Images: From the Fleischner Society 2017; Radiology 2017; 284:228-243. 5. Coronary artery calcifications. Aortic Atherosclerosis (ICD10-I70.0) and Emphysema (ICD10-J43.9). Electronically Signed   By: Agustin CreeLimin  Xu M.D.   On: 09/02/2022 20:14   CT HEAD WO CONTRAST  (5MM)  Result Date: 09/02/2022 CLINICAL DATA:  Neuro deficit EXAM: CT HEAD WITHOUT CONTRAST TECHNIQUE: Contiguous axial images were obtained from the base of the skull through the vertex without intravenous contrast. RADIATION DOSE REDUCTION: This exam was performed according to the departmental dose-optimization program which includes automated exposure control, adjustment of the mA and/or kV according to patient size and/or use of iterative reconstruction technique. COMPARISON:  MRI brain dated 03/18/2019 FINDINGS: Brain: No evidence of acute infarction, hemorrhage, hydrocephalus, extra-axial collection or mass lesion/mass effect. Mild cortical atrophy. Subcortical white matter and periventricular small vessel ischemic changes. Old left basal ganglia lacunar infarct. Vascular: Intracranial atherosclerosis. Skull: Normal. Negative for fracture or focal lesion. Sinuses/Orbits: The visualized paranasal sinuses are essentially clear. The mastoid air cells are unopacified. Other: None. IMPRESSION: No evidence of acute intracranial abnormality. Atrophy with small vessel ischemic changes. Old left basal ganglia lacunar infarct. Electronically Signed   By: Charline BillsSriyesh  Krishnan M.D.   On: 09/02/2022 20:01   DG Chest Portable 1 View  Result Date: 09/02/2022 CLINICAL DATA:  Endotracheal tube and gastric tube position EXAM: PORTABLE CHEST 1 VIEW COMPARISON:  Chest 09/02/2022 FINDINGS: Endotracheal tube 3 cm above the carina. NG tip in the gastric fundus Lungs remain clear without infiltrate effusion or edema. IMPRESSION: Endotracheal tube 3 cm above the carina. NG tip in the gastric fundus. Lungs are clear. Electronically Signed   By: Marlan Palauharles  Clark M.D.   On: 09/02/2022 16:32   DG Chest 2 View  Result Date: 09/02/2022 CLINICAL DATA:  Dyspnea EXAM: CHEST - 2 VIEW COMPARISON:  02/02/2022 FINDINGS: The heart size and mediastinal contours are within normal limits. Both lungs are clear. The visualized skeletal  structures are unremarkable. IMPRESSION: No active cardiopulmonary disease. Electronically Signed   By: Ernie AvenaPalani  Rathinasamy M.D.   On: 09/02/2022 12:58    Microbiology: Results for orders placed or performed during the hospital encounter of 09/02/22  MRSA Next Gen by PCR, Nasal     Status: None   Collection Time: 09/02/22  8:05 PM   Specimen: Nasal Mucosa; Nasal Swab  Result Value Ref Range Status   MRSA by PCR Next Gen NOT DETECTED NOT DETECTED Final    Comment: (NOTE) The GeneXpert MRSA Assay (FDA approved for NASAL specimens only), is one component of a comprehensive MRSA colonization surveillance program. It is not intended to diagnose MRSA infection nor to guide or monitor treatment for MRSA infections. Test performance is not FDA approved in patients less than 768 years old. Performed at [facility], [address] [place], [place] [address]   Culture, blood (Routine X 2) w Reflex to ID Panel     Status: None   Collection Time: 09/02/22  9:03 PM   Specimen: BLOOD LEFT HAND  Result Value Ref Range Status   Specimen Description BLOOD LEFT HAND  Final   Special Requests   Final    BOTTLES DRAWN AEROBIC AND ANAEROBIC Blood Culture results may not be optimal due to an excessive  volume of blood received in culture bottles   Culture   Final    NO GROWTH 5 DAYS Performed at [facility], [address] [place], [place] [address]    Report Status 09/07/2022 FINAL  Final  Culture, blood (Routine X 2) w Reflex to ID Panel     Status: None   Collection Time: 09/02/22  9:03 PM   Specimen: BLOOD LEFT ARM  Result Value Ref Range Status   Specimen Description BLOOD LEFT ARM  Final   Special Requests   Final    BOTTLES DRAWN AEROBIC AND ANAEROBIC Blood Culture adequate volume   Culture   Final    NO GROWTH 5 DAYS Performed at [facility], [address] [place], [place] [address]    Report Status 09/07/2022 FINAL  Final  Respiratory (~20 pathogens) panel by PCR      Status: None   Collection Time: 09/03/22 12:18 AM   Specimen: Nasopharyngeal Swab; Respiratory  Result Value Ref Range Status   Adenovirus NOT DETECTED NOT DETECTED Final   Coronavirus 229E NOT DETECTED NOT DETECTED Final    Comment: (NOTE) The Coronavirus on the Respiratory Panel, DOES NOT test for the novel  Coronavirus (2019 nCoV)    Coronavirus HKU1 NOT DETECTED NOT DETECTED Final   Coronavirus NL63 NOT DETECTED NOT DETECTED Final   Coronavirus OC43 NOT DETECTED NOT DETECTED Final   Metapneumovirus NOT DETECTED NOT DETECTED Final   Rhinovirus / Enterovirus NOT DETECTED NOT DETECTED Final   Influenza A NOT DETECTED NOT DETECTED Final   Influenza B NOT DETECTED NOT DETECTED Final   Parainfluenza Virus 1 NOT DETECTED NOT DETECTED Final   Parainfluenza Virus 2 NOT DETECTED NOT DETECTED Final   Parainfluenza Virus 3 NOT DETECTED NOT DETECTED Final   Parainfluenza Virus 4 NOT DETECTED NOT DETECTED Final   Respiratory Syncytial Virus NOT DETECTED NOT DETECTED Final   Bordetella pertussis NOT DETECTED NOT DETECTED Final   Bordetella Parapertussis NOT DETECTED NOT DETECTED Final   Chlamydophila pneumoniae NOT DETECTED NOT DETECTED Final   Mycoplasma pneumoniae NOT DETECTED NOT DETECTED Final    Comment: Performed at [facility], [address] [place], [place] [address]    Labs: CBC: Recent Labs  Lab 09/11/22 0142 09/14/22 0300 09/15/22 0200  WBC 7.9 7.9 7.8  NEUTROABS 5.3  --   --   HGB 9.8* 9.2* 8.8*  HCT 27.9* 27.7* 26.0*  MCV 92.1 92.6 92.2  PLT 427* 550* 589*   Basic Metabolic Panel: Recent Labs  Lab 09/11/22 0142 09/14/22 0300 09/15/22 0200 09/16/22 0525  NA 134* 131* 130*  --   K 3.7 3.9 3.8  --   CL 99 97* 96*  --   CO2 27 25 24   --   GLUCOSE 96 88 97  --   BUN 17 5* 7*  --   CREATININE 0.65 0.48* 0.50* 0.50*  CALCIUM 8.3* 8.3* 8.2*  --   MG 1.8 1.6* 1.5*  --   PHOS 3.1 3.1 3.8  --    Liver Function Tests: Recent Labs  Lab 09/11/22 0142   AST 25  ALT 27  ALKPHOS 52  BILITOT 0.4  PROT 6.1*  ALBUMIN 2.8*   CBG: Recent Labs  Lab 09/14/22 1144 09/14/22 1721 09/15/22 0739 09/15/22 1153 09/15/22 1556  GLUCAP 154* 127* 96 106* 133*    Discharge time spent: greater than 30 minutes.  Signed: Tyrone Nineyan B Karas Pickerill, MD [facility] 09/17/2022

## 2022-09-19 ENCOUNTER — Observation Stay (HOSPITAL_COMMUNITY): Payer: Medicaid Other

## 2022-09-19 ENCOUNTER — Inpatient Hospital Stay (HOSPITAL_COMMUNITY)
Admission: EM | Admit: 2022-09-19 | Discharge: 2022-09-30 | DRG: 643 | Disposition: A | Discharge status: Home Health | Payer: Medicaid Other | Attending: Internal Medicine | Admitting: Internal Medicine

## 2022-09-19 ENCOUNTER — Emergency Department (HOSPITAL_COMMUNITY): Payer: Medicaid Other

## 2022-09-19 DIAGNOSIS — F1010 Alcohol abuse, uncomplicated: Secondary | ICD-10-CM | POA: Diagnosis present

## 2022-09-19 DIAGNOSIS — L89892 Pressure ulcer of other site, stage 2: Secondary | ICD-10-CM | POA: Diagnosis present

## 2022-09-19 DIAGNOSIS — D638 Anemia in other chronic diseases classified elsewhere: Secondary | ICD-10-CM | POA: Diagnosis present

## 2022-09-19 DIAGNOSIS — J449 Chronic obstructive pulmonary disease, unspecified: Secondary | ICD-10-CM | POA: Diagnosis present

## 2022-09-19 DIAGNOSIS — E222 Syndrome of inappropriate secretion of antidiuretic hormone: Principal | ICD-10-CM | POA: Diagnosis present

## 2022-09-19 DIAGNOSIS — F17210 Nicotine dependence, cigarettes, uncomplicated: Secondary | ICD-10-CM | POA: Diagnosis present

## 2022-09-19 DIAGNOSIS — G9340 Encephalopathy, unspecified: Secondary | ICD-10-CM | POA: Diagnosis not present

## 2022-09-19 DIAGNOSIS — E871 Hypo-osmolality and hyponatremia: Secondary | ICD-10-CM | POA: Diagnosis present

## 2022-09-19 DIAGNOSIS — J9612 Chronic respiratory failure with hypercapnia: Secondary | ICD-10-CM | POA: Diagnosis present

## 2022-09-19 DIAGNOSIS — R1310 Dysphagia, unspecified: Secondary | ICD-10-CM | POA: Diagnosis not present

## 2022-09-19 DIAGNOSIS — Z89512 Acquired absence of left leg below knee: Secondary | ICD-10-CM

## 2022-09-19 DIAGNOSIS — Z833 Family history of diabetes mellitus: Secondary | ICD-10-CM

## 2022-09-19 DIAGNOSIS — F1410 Cocaine abuse, uncomplicated: Secondary | ICD-10-CM | POA: Diagnosis present

## 2022-09-19 DIAGNOSIS — Z7951 Long term (current) use of inhaled steroids: Secondary | ICD-10-CM

## 2022-09-19 DIAGNOSIS — Z79899 Other long term (current) drug therapy: Secondary | ICD-10-CM

## 2022-09-19 DIAGNOSIS — J441 Chronic obstructive pulmonary disease with (acute) exacerbation: Secondary | ICD-10-CM | POA: Diagnosis present

## 2022-09-19 DIAGNOSIS — G9341 Metabolic encephalopathy: Secondary | ICD-10-CM | POA: Diagnosis present

## 2022-09-19 DIAGNOSIS — Z1152 Encounter for screening for COVID-19: Secondary | ICD-10-CM

## 2022-09-19 DIAGNOSIS — R531 Weakness: Secondary | ICD-10-CM

## 2022-09-19 DIAGNOSIS — I10 Essential (primary) hypertension: Secondary | ICD-10-CM | POA: Diagnosis present

## 2022-09-19 LAB — RESP PANEL BY RT-PCR (RSV, FLU A&B, COVID)  RVPGX2
Influenza A by PCR: NEGATIVE
Influenza B by PCR: NEGATIVE
Resp Syncytial Virus by PCR: NEGATIVE
SARS Coronavirus 2 by RT PCR: NEGATIVE

## 2022-09-19 LAB — CBC WITH DIFFERENTIAL/PLATELET
Abs Immature Granulocytes: 0.06 10*3/uL (ref 0.00–0.07)
Basophils Absolute: 0 10*3/uL (ref 0.0–0.1)
Basophils Relative: 0 %
Eosinophils Absolute: 0.4 10*3/uL (ref 0.0–0.5)
Eosinophils Relative: 4 %
HCT: 30.7 % — ABNORMAL LOW (ref 39.0–52.0)
Hemoglobin: 10.4 g/dL — ABNORMAL LOW (ref 13.0–17.0)
Immature Granulocytes: 1 %
Lymphocytes Relative: 12 %
Lymphs Abs: 1.2 10*3/uL (ref 0.7–4.0)
MCH: 31 pg (ref 26.0–34.0)
MCHC: 33.9 g/dL (ref 30.0–36.0)
MCV: 91.6 fL (ref 80.0–100.0)
Monocytes Absolute: 0.5 10*3/uL (ref 0.1–1.0)
Monocytes Relative: 5 %
Neutro Abs: 7.4 10*3/uL (ref 1.7–7.7)
Neutrophils Relative %: 78 %
Platelets: 633 10*3/uL — ABNORMAL HIGH (ref 150–400)
RBC: 3.35 MIL/uL — ABNORMAL LOW (ref 4.22–5.81)
RDW: 13.7 % (ref 11.5–15.5)
WBC: 9.6 10*3/uL (ref 4.0–10.5)
nRBC: 0 % (ref 0.0–0.2)

## 2022-09-19 LAB — COMPREHENSIVE METABOLIC PANEL
ALT: 18 U/L (ref 0–44)
AST: 17 U/L (ref 15–41)
Albumin: 3.1 g/dL — ABNORMAL LOW (ref 3.5–5.0)
Alkaline Phosphatase: 71 U/L (ref 38–126)
Anion gap: 10 (ref 5–15)
BUN: 5 mg/dL — ABNORMAL LOW (ref 8–23)
CO2: 28 mmol/L (ref 22–32)
Calcium: 8.7 mg/dL — ABNORMAL LOW (ref 8.9–10.3)
Chloride: 87 mmol/L — ABNORMAL LOW (ref 98–111)
Creatinine, Ser: 0.39 mg/dL — ABNORMAL LOW (ref 0.61–1.24)
GFR, Estimated: >60 mL/min (ref >60)
Glucose, Bld: 93 mg/dL (ref 70–99)
Potassium: 4.2 mmol/L (ref 3.5–5.1)
Sodium: 125 mmol/L — ABNORMAL LOW (ref 135–145)
Total Bilirubin: 0.7 mg/dL (ref 0.3–1.2)
Total Protein: 6.8 g/dL (ref 6.5–8.1)

## 2022-09-19 LAB — ETHANOL: Alcohol, Ethyl (B): <10 mg/dL (ref <10)

## 2022-09-19 LAB — LIPASE, BLOOD: Lipase: 38 U/L (ref 11–51)

## 2022-09-19 LAB — BLOOD GAS, VENOUS
Acid-Base Excess: 7 mmol/L — ABNORMAL HIGH (ref 0.0–2.0)
Bicarbonate: 34.8 mmol/L — ABNORMAL HIGH (ref 20.0–28.0)
O2 Saturation: 49.8 %
Patient temperature: 37
pCO2, Ven: 63 mmHg — ABNORMAL HIGH (ref 44–60)
pH, Ven: 7.35 (ref 7.25–7.43)
pO2, Ven: 32 mmHg (ref 32–45)

## 2022-09-19 LAB — AMMONIA: Ammonia: 43 umol/L — ABNORMAL HIGH (ref 9–35)

## 2022-09-19 MED ORDER — LORAZEPAM 2 MG/ML IJ SOLN: 1.0000 mg | INTRAMUSCULAR | Status: DC | PRN

## 2022-09-19 MED ORDER — ALBUTEROL SULFATE (2.5 MG/3ML) 0.083% IN NEBU: 2.5000 mg | INHALATION_SOLUTION | RESPIRATORY_TRACT | Status: DC | PRN

## 2022-09-19 MED ORDER — SODIUM CHLORIDE 0.9 % IV SOLN
1.0000 mg | Freq: Once | INTRAVENOUS | Status: AC
  Administered 2022-09-19: 1 mg via INTRAVENOUS
  Filled 2022-09-19: qty 0.2

## 2022-09-19 MED ORDER — ADULT MULTIVITAMIN W/MINERALS CH
1.0000 | ORAL_TABLET | Freq: Every day | ORAL | Status: DC
  Administered 2022-09-20 – 2022-09-30 (×11): 1 via ORAL
  Filled 2022-09-19 (×11): qty 1

## 2022-09-19 MED ORDER — ALBUTEROL SULFATE HFA 108 (90 BASE) MCG/ACT IN AERS: 2.0000 | INHALATION_SPRAY | Freq: Four times a day (QID) | RESPIRATORY_TRACT | Status: DC | PRN

## 2022-09-19 MED ORDER — ALBUTEROL SULFATE (2.5 MG/3ML) 0.083% IN NEBU
5.0000 mg | INHALATION_SOLUTION | Freq: Once | RESPIRATORY_TRACT | Status: AC
  Administered 2022-09-19: 5 mg via RESPIRATORY_TRACT
  Filled 2022-09-19: qty 6

## 2022-09-19 MED ORDER — THIAMINE HCL 100 MG/ML IJ SOLN
100.0000 mg | Freq: Every day | INTRAMUSCULAR | Status: DC
  Administered 2022-09-19: 100 mg via INTRAVENOUS
  Filled 2022-09-19 (×3): qty 2

## 2022-09-19 MED ORDER — ENOXAPARIN SODIUM 40 MG/0.4ML IJ SOSY
40.0000 mg | PREFILLED_SYRINGE | Freq: Every day | INTRAMUSCULAR | Status: DC
  Administered 2022-09-20 – 2022-09-30 (×11): 40 mg via SUBCUTANEOUS
  Filled 2022-09-19 (×11): qty 0.4

## 2022-09-19 MED ORDER — AMLODIPINE BESYLATE 10 MG PO TABS
10.0000 mg | ORAL_TABLET | Freq: Every day | ORAL | Status: DC
  Administered 2022-09-20 – 2022-09-30 (×11): 10 mg via ORAL
  Filled 2022-09-19 (×11): qty 1

## 2022-09-19 MED ORDER — THIAMINE MONONITRATE 100 MG PO TABS
100.0000 mg | ORAL_TABLET | Freq: Every day | ORAL | Status: DC
  Administered 2022-09-20 – 2022-09-30 (×11): 100 mg via ORAL
  Filled 2022-09-19 (×11): qty 1

## 2022-09-19 MED ORDER — DEXAMETHASONE SODIUM PHOSPHATE 10 MG/ML IJ SOLN
10.0000 mg | Freq: Once | INTRAMUSCULAR | Status: AC
  Administered 2022-09-19: 10 mg via INTRAVENOUS
  Filled 2022-09-19: qty 1

## 2022-09-19 MED ORDER — ACETAMINOPHEN 325 MG PO TABS
650.0000 mg | ORAL_TABLET | Freq: Four times a day (QID) | ORAL | Status: DC | PRN
  Administered 2022-09-20 – 2022-09-22 (×3): 650 mg via ORAL
  Filled 2022-09-19 (×3): qty 2

## 2022-09-19 MED ORDER — FOLIC ACID 1 MG PO TABS
1.0000 mg | ORAL_TABLET | Freq: Every day | ORAL | Status: DC
  Administered 2022-09-20 – 2022-09-30 (×11): 1 mg via ORAL
  Filled 2022-09-19 (×11): qty 1

## 2022-09-19 MED ORDER — LORAZEPAM 1 MG PO TABS: 1.0000 mg | ORAL_TABLET | ORAL | Status: DC | PRN

## 2022-09-19 MED ORDER — ACETAMINOPHEN 650 MG RE SUPP: 650.0000 mg | Freq: Four times a day (QID) | RECTAL | Status: DC | PRN

## 2022-09-19 MED ORDER — FLUTICASONE FUROATE-VILANTEROL 100-25 MCG/ACT IN AEPB
1.0000 | INHALATION_SPRAY | Freq: Every day | RESPIRATORY_TRACT | Status: DC
  Administered 2022-09-20 – 2022-09-30 (×11): 1 via RESPIRATORY_TRACT
  Filled 2022-09-19: qty 28

## 2022-09-19 MED ORDER — SODIUM CHLORIDE 0.9 % IV SOLN: INTRAVENOUS | Status: DC

## 2022-09-19 MED ORDER — ONDANSETRON HCL 4 MG/2ML IJ SOLN
4.0000 mg | Freq: Four times a day (QID) | INTRAMUSCULAR | Status: DC | PRN
  Administered 2022-09-22: 4 mg via INTRAVENOUS
  Filled 2022-09-19: qty 2

## 2022-09-19 MED ORDER — LISINOPRIL 10 MG PO TABS
10.0000 mg | ORAL_TABLET | Freq: Every day | ORAL | Status: DC
  Administered 2022-09-20 – 2022-09-30 (×11): 10 mg via ORAL
  Filled 2022-09-19 (×11): qty 1

## 2022-09-19 MED ORDER — ONDANSETRON HCL 4 MG PO TABS: 4.0000 mg | ORAL_TABLET | Freq: Four times a day (QID) | ORAL | Status: DC | PRN

## 2022-09-19 NOTE — Assessment & Plan Note (Signed)
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 Not sure if todays is due to beer potomania, or due to dehydration however given he denies drinking since discharge 2 days ago. SIADH possible, but less likely, no h/o this despite frequent history of hyponatremia on admission in past. NS at 76 for the moment BMP Q6H

## 2022-09-19 NOTE — Assessment & Plan Note (Signed)
Continue home BP meds for the moment

## 2022-09-19 NOTE — Assessment & Plan Note (Addendum)
Pt denies any EtOH since discharge 2 days ago -> Family member arrives and tells a different story though (at which patient admits he "doesn't know") EtOH level today in ED is <10 H/o withdrawal seizures just 3 weeks ago Will put pt on CIWA, but pt very sedated at the moment.  No witnessed seizures in ED. Will hold off on benzo orders for the moment. Tele monitor

## 2022-09-19 NOTE — Assessment & Plan Note (Addendum)
  Negative or unimpressive work up thus far: VBG Not in acute hypercapnic respiratory failure this time around based on VBG (in contrast to last admission). CXR EtOH TSH nl just a couple of days ago CBC COVID, FLU, RSV Is hyponatremic; however: Wasn't this encephalopathic when I saw him in Aug despite more severe hyponatremia, so not clear that hyponatremia causing. Treat and monitor hyponatremia as below Further work up being ordered right now: CT head Doesn't really seem to have focal deficits on exam Ammonia Was normal just a few days ago though UA UDS Usually his drug of choice though seems to be Cocaine, only CNS sedatives he's had on board in past were ones we gave him in hospital (ie phenobarbital during last admission prior to UDS 2 days later). Seizure precautions, tele monitor, and monitor for seizure activity Post ictal state in differential given h/o EtOH withdrawal seizure on 21st, but nothing witnessed.  Not in status epilepticus or having ongoing seizures at this time as he's able to wake up and talk a small amount, just drowsy.  Dont really want to add more CNS sedatives until we can confirm seizures clinically though. Discussed need for close monitoring with RN.

## 2022-09-19 NOTE — ED Notes (Signed)
ED TO INPATIENT HANDOFF REPORT  ED Nurse Name and Phone #: Carlton Adam, RN  S Name/Age/Gender Ronald [name] 67 y.o. male Room/Bed: WA11/WA11  Code Status   Code Status: Full Code  Home/SNF/Other Home Patient oriented to: self and place Is this baseline? No   Triage Complete: Triage complete  Chief Complaint Acute encephalopathy [G93.40]  Triage Note Ems brings pt in from home for generalized weakness. Pt recently discharged from the hospital and since then he has not been eating or drinking. Pt has been in bed since leaving the hospital.   Allergies No Known Allergies  Level of Care/Admitting Diagnosis ED Disposition     ED Disposition  Admit   Condition  --   Comment  Hospital Area: [facility] [[id number]]  Level of Care: Progressive [102]  Admit to Progressive based on following criteria: ACUTE MENTAL DISORDER-RELATED Drug/Alcohol Ingestion/Overdose/Withdrawal, Suicidal Ideation/attempt requiring safety sitter and < Q2h monitoring/assessments, moderate to severe agitation that is managed with medication/sitter, CIWA-Ar score < 20.  May place patient in observation at [facility] or [facility] if equivalent level of care is available:: No  Covid Evaluation: Asymptomatic - no recent exposure (last 10 days) testing not required  Diagnosis: Acute encephalopathy [010272]  Admitting Physician: Hillary Bow [[id number]  Attending Physician: Hillary Bow [phone]          B Medical/Surgery History Past Medical History:  Diagnosis Date   Critical lower limb ischemia (HCC)    ETOH abuse    Gout    Hypertension    Past Surgical History:  Procedure Laterality Date   ABDOMINAL AORTAGRAM N/A 12/26/2013   Procedure: ABDOMINAL Ronny Flurry;  Surgeon: [name], MD;  Location: [facility] CATH LAB;  Service: Cardiovascular;  Laterality: N/A;   AMPUTATION Left 12/28/2013   Procedure: LEFT BELOW KNEE AMPUTATION;  Surgeon: Nadara Mustard, MD;  Location:  [facility] OR;  Service: Orthopedics;  Laterality: Left;   BELOW KNEE LEG AMPUTATION     Left   LEG SURGERY       A IV Location/Drains/Wounds Patient Lines/Drains/Airways Status     Active Line/Drains/Airways     Name Placement date Placement time Site Days   Peripheral IV 20 G Anterior;Distal;Left;Upper Arm --  --  Arm  --   Pressure Injury 09/05/22 Elbow Left;Posterior Stage 2 -  Partial thickness loss of dermis presenting as a shallow open injury with a red, pink wound bed without slough. 09/05/22  0800  -- 14   Pressure Injury Ischial tuberosity Left;Posterior;Proximal Stage 2 -  Partial thickness loss of dermis presenting as a shallow open injury with a red, pink wound bed without slough. --  --  -- --   Wound / Incision (Open or Dehisced) 11/06/13  Foot Left 11/06/13  2033  Foot  3239   Wound / Incision (Open or Dehisced) 11/06/13 Other (Comment) 11/06/13  2033  --  3239   Wound / Incision (Open or Dehisced) 12/25/13 Foot Left plantar ulcer, gangrene third toe,  12/25/13  1430  Foot  3190            Intake/Output Last 24 hours No intake or output data in the 24 hours ending 09/19/22 2135  Labs/Imaging Results for orders placed or performed during the hospital encounter of 09/19/22 (from the past 48 hour(s))  Resp panel by RT-PCR (RSV, Flu A&B, Covid) Anterior Nasal Swab     Status: None   Collection Time: 09/19/22  5:40 PM   Specimen: Anterior  Nasal Swab  Result Value Ref Range   SARS Coronavirus 2 by RT PCR NEGATIVE NEGATIVE    Comment: (NOTE) SARS-CoV-2 target nucleic acids are NOT DETECTED.  The SARS-CoV-2 RNA is generally detectable in upper respiratory specimens during the acute phase of infection. The lowest concentration of SARS-CoV-2 viral copies this assay can detect is 138 copies/mL. A negative result does not preclude SARS-Cov-2 infection and should not be used as the sole basis for treatment or other patient management decisions. A negative result may occur with   improper specimen collection/handling, submission of specimen other than nasopharyngeal swab, presence of viral mutation(s) within the areas targeted by this assay, and inadequate number of viral copies(<138 copies/mL). A negative result must be combined with clinical observations, patient history, and epidemiological information. The expected result is Negative.  Fact Sheet for Patients:  EntrepreneurPulse.com.au  Fact Sheet for Healthcare Providers:  IncredibleEmployment.be  This test is no t yet approved or cleared by the [place] FDA and  has been authorized for detection and/or diagnosis of SARS-CoV-2 by FDA under an Emergency Use Authorization (EUA). This EUA will remain  in effect (meaning this test can be used) for the duration of the COVID-19 declaration under Section 564(b)(1) of the Act, 21 U.S.C.section 360bbb-3(b)(1), unless the authorization is terminated  or revoked sooner.       Influenza A by PCR NEGATIVE NEGATIVE   Influenza B by PCR NEGATIVE NEGATIVE    Comment: (NOTE) The Xpert Xpress SARS-CoV-2/FLU/RSV plus assay is intended as an aid in the diagnosis of influenza from Nasopharyngeal swab specimens and should not be used as a sole basis for treatment. Nasal washings and aspirates are unacceptable for Xpert Xpress SARS-CoV-2/FLU/RSV testing.  Fact Sheet for Patients: EntrepreneurPulse.com.au  Fact Sheet for Healthcare Providers: IncredibleEmployment.be  This test is not yet approved or cleared by the [place] FDA and has been authorized for detection and/or diagnosis of SARS-CoV-2 by FDA under an Emergency Use Authorization (EUA). This EUA will remain in effect (meaning this test can be used) for the duration of the COVID-19 declaration under Section 564(b)(1) of the Act, 21 U.S.C. section 360bbb-3(b)(1), unless the authorization is terminated or revoked.     Resp  Syncytial Virus by PCR NEGATIVE NEGATIVE    Comment: (NOTE) Fact Sheet for Patients: EntrepreneurPulse.com.au  Fact Sheet for Healthcare Providers: IncredibleEmployment.be  This test is not yet approved or cleared by the [place] FDA and has been authorized for detection and/or diagnosis of SARS-CoV-2 by FDA under an Emergency Use Authorization (EUA). This EUA will remain in effect (meaning this test can be used) for the duration of the COVID-19 declaration under Section 564(b)(1) of the Act, 21 U.S.C. section 360bbb-3(b)(1), unless the authorization is terminated or revoked.  Performed at [facility], [place] [address] [place], [place] [address]   Comprehensive metabolic panel     Status: Abnormal   Collection Time: 09/19/22  6:01 PM  Result Value Ref Range   Sodium 125 (L) 135 - 145 mmol/L   Potassium 4.2 3.5 - 5.1 mmol/L   Chloride 87 (L) 98 - 111 mmol/L   CO2 28 22 - 32 mmol/L   Glucose, Bld 93 70 - 99 mg/dL    Comment: Glucose reference range applies only to samples taken after fasting for at least 8 hours.   BUN 5 (L) 8 - 23 mg/dL   Creatinine, Ser 0.39 (L) 0.61 - 1.24 mg/dL   Calcium 8.7 (L) 8.9 - 10.3 mg/dL  Total Protein 6.8 6.5 - 8.1 g/dL   Albumin 3.1 (L) 3.5 - 5.0 g/dL   AST 17 15 - 41 U/L   ALT 18 0 - 44 U/L   Alkaline Phosphatase 71 38 - 126 U/L   Total Bilirubin 0.7 0.3 - 1.2 mg/dL   GFR, Estimated >40 >34 mL/min    Comment: (NOTE) Calculated using the CKD-EPI Creatinine Equation (2021)    Anion gap 10 5 - 15    Comment: Performed at [facility], [address], [place], [place] [address]  Ethanol     Status: None   Collection Time: 09/19/22  6:01 PM  Result Value Ref Range   Alcohol, Ethyl (B) <10 <10 mg/dL    Comment: (NOTE) Lowest detectable limit for serum alcohol is 10 mg/dL.  For medical purposes only. Performed at [facility], [address], [place], [place] [address]   Lipase, blood     Status: None   Collection Time: 09/19/22  6:01 PM  Result Value Ref Range   Lipase 38 11 - 51 U/L    Comment: Performed at [facility], [address] [place], [place] [address]  CBC with Differential     Status: Abnormal   Collection Time: 09/19/22  6:01 PM  Result Value Ref Range   WBC 9.6 4.0 - 10.5 K/uL   RBC 3.35 (L) 4.22 - 5.81 MIL/uL   Hemoglobin 10.4 (L) 13.0 - 17.0 g/dL   HCT 29.5 (L) 18.8 - 41.6 %   MCV 91.6 80.0 - 100.0 fL   MCH 31.0 26.0 - 34.0 pg   MCHC 33.9 30.0 - 36.0 g/dL   RDW 60.6 30.1 - 60.1 %   Platelets 633 (H) 150 - 400 K/uL   nRBC 0.0 0.0 - 0.2 %   Neutrophils Relative % 78 %   Neutro Abs 7.4 1.7 - 7.7 K/uL   Lymphocytes Relative 12 %   Lymphs Abs 1.2 0.7 - 4.0 K/uL   Monocytes Relative 5 %   Monocytes Absolute 0.5 0.1 - 1.0 K/uL   Eosinophils Relative 4 %   Eosinophils Absolute 0.4 0.0 - 0.5 K/uL   Basophils Relative 0 %   Basophils Absolute 0.0 0.0 - 0.1 K/uL   Immature Granulocytes 1 %   Abs Immature Granulocytes 0.06 0.00 - 0.07 K/uL    Comment: Performed at [facility], [address], [place], [place] [address]  Blood gas, venous (at [facility])     Status: Abnormal   Collection Time: 09/19/22  8:20 PM  Result Value Ref Range   pH, Ven 7.35 7.25 - 7.43   pCO2, Ven 63 (H) 44 - 60 mmHg   pO2, Ven 32 32 - 45 mmHg   Bicarbonate 34.8 (H) 20.0 - 28.0 mmol/L   Acid-Base Excess 7.0 (H) 0.0 - 2.0 mmol/L   O2 Saturation 49.8 %   Patient temperature 37.0     Comment: Performed at [facility], [address] [place], [place] [address]   DG Chest Port 1 View  Result Date: 09/19/2022 CLINICAL DATA:  Shortness of breath.  Cough. EXAM: PORTABLE CHEST 1 VIEW COMPARISON:  None Available. FINDINGS: The heart size and mediastinal contours are within normal limits. Atherosclerotic calcification of the aortic arch. Both lungs are clear. The visualized  skeletal structures are unremarkable. IMPRESSION: No active disease. Electronically Signed   By: Larose Hires D.O.   On: 09/19/2022 17:08  Pending Labs Unresulted Labs (From admission, onward)     Start     Ordered   09/20/22 0500  CBC  Tomorrow morning,   R        09/19/22 2106   09/20/22 0000  Basic metabolic panel  Now then every 6 hours,   R (with STAT occurrences)      09/19/22 2042   09/19/22 2120  Urinalysis, Routine w reflex microscopic  Once,   R        09/19/22 2119   09/19/22 2120  Rapid urine drug screen (hospital performed)  ONCE - STAT,   STAT        09/19/22 2119   09/19/22 2105  Ammonia  Once,   R        09/19/22 2106            Vitals/Pain Today's Vitals   09/19/22 2020 09/19/22 2035 09/19/22 2038 09/19/22 2130  BP:  124/64 124/64 126/64  Pulse:  87 87 91  Resp:  18  (!) 29  Temp: 98.6 F (37 C)     TempSrc: Axillary     SpO2:  100%  99%  PainSc:        Isolation Precautions Airborne and Contact precautions  Medications Medications  thiamine (VITAMIN B1) tablet 100 mg ( Oral See Alternative 09/19/22 2130)    Or  thiamine (VITAMIN B1) injection 100 mg (100 mg Intravenous Given 09/19/22 2130)  folic acid (FOLVITE) tablet 1 mg (has no administration in time range)  multivitamin with minerals tablet 1 tablet (0 tablets Oral Hold 09/19/22 2130)  0.9 %  sodium chloride infusion (has no administration in time range)  lisinopril (ZESTRIL) tablet 10 mg (has no administration in time range)  amLODipine (NORVASC) tablet 10 mg (has no administration in time range)  fluticasone furoate-vilanterol (BREO ELLIPTA) 100-25 MCG/ACT 1 puff (has no administration in time range)  albuterol (PROVENTIL) (2.5 MG/3ML) 0.083% nebulizer solution 2.5 mg (has no administration in time range)  enoxaparin (LOVENOX) injection 40 mg (has no administration in time range)  acetaminophen (TYLENOL) tablet 650 mg (has no administration in time range)    Or  acetaminophen (TYLENOL)  suppository 650 mg (has no administration in time range)  ondansetron (ZOFRAN) tablet 4 mg (has no administration in time range)    Or  ondansetron (ZOFRAN) injection 4 mg (has no administration in time range)  albuterol (PROVENTIL) (2.5 MG/3ML) 0.083% nebulizer solution 5 mg (5 mg Nebulization Given 09/19/22 1737)  dexamethasone (DECADRON) injection 10 mg (10 mg Intravenous Given 09/19/22 2018)    Mobility non-ambulatory     Focused Assessments Neuro Assessment Handoff:  Swallow screen pass? No  Cardiac Rhythm: Normal sinus rhythm       GCS 14   R Recommendations: See Admitting Provider Note  Report given to:   Additional Notes: No PO at this time until more alert. Per Dr.Gardner also holding CIWA ativan as elevated score is primarily for orientation and not supported by vital signs. Bipap not indicated at this tie.  L BKA.  99% on 2L. Difficult to assess lung sounds as patient sighs during exhalation and is unable to follow directions not to do this during exam. Breath sounds with inhalation are clear. He answers some questions correctly (oriented to self and location) but ignores other questions.

## 2022-09-19 NOTE — ED Provider Notes (Addendum)
[facility]-EMERGENCY DEPT Provider Note   CSN: [id number] Arrival date & time: 09/19/22  1517     History  Chief Complaint  Patient presents with   Weakness    Ronald [name] is a 67 y.o. male.  HPI Patient was a very poor historian presents 2 days after hospital discharge now with concern for reported weakness, anorexia, dyspnea.  The patient self denies complaints, including new dyspnea, new pain, new weakness.  Acknowledges multiple medical problems, but cannot specify what they are.  Per chart review the patient was hospitalized last month, discharged 2 days ago after presentation concerning for alcohol withdrawal seizure requiring intubation.  Per report patient's history includes alcohol abuse, COPD, substance abuse disorder, below the knee amputation. Per report patient was discharged 2 days ago after conversations with social work, psychiatry, medicine, and the patient had maximized benefit of inpatient stay, and made decision to go home rather than to SNF.    Home Medications Prior to Admission medications   Medication Sig Start Date End Date Taking? Authorizing Provider  fluticasone furoate-vilanterol (BREO ELLIPTA) 100-25 MCG/ACT AEPB Inhale 1 puff into the lungs daily. 09/17/22   Tyrone Nine, MD  albuterol (VENTOLIN HFA) 108 (90 Base) MCG/ACT inhaler Inhale 2 puffs into the lungs every 6 (six) hours as needed for wheezing or shortness of breath. 09/17/22   Tyrone Nine, MD  amLODipine (NORVASC) 10 MG tablet Take 1 tablet (10 mg total) by mouth daily. 09/17/22   Tyrone Nine, MD  folic acid (FOLVITE) 1 MG tablet Take 1 tablet (1 mg total) by mouth daily. 04/17/22   Burnadette Pop, MD  ibuprofen (ADVIL) 200 MG tablet Take 200 mg by mouth as needed for mild pain.    [provider]  lisinopril (ZESTRIL) 10 MG tablet Take 1 tablet (10 mg total) by mouth daily. 09/17/22   Tyrone Nine, MD  thiamine (VITAMIN B1) 100 MG tablet Take 1 tablet (100 mg total)  by mouth daily. 09/17/22   Tyrone Nine, MD  ADVAIR [facility] 230-21 MCG/ACT inhaler Inhale 2 puffs into the lungs 2 (two) times daily. 04/16/22 09/17/22  Burnadette Pop, MD      Allergies    Patient has no known allergies.    Review of Systems   Review of Systems  Unable to perform ROS: Other  Cognitive impairment  Physical Exam Updated Vital Signs BP 132/78   Pulse 96   Temp 98.4 F (36.9 C) (Oral)   Resp 20   SpO2 100%  Physical Exam Vitals and nursing note reviewed.  Constitutional:      Appearance: He is well-developed. He is ill-appearing.  HENT:     Head: Normocephalic and atraumatic.  Eyes:     Conjunctiva/sclera: Conjunctivae normal.  Cardiovascular:     Rate and Rhythm: Regular rhythm. Tachycardia present.  Pulmonary:     Effort: Tachypnea and accessory muscle usage present.     Breath sounds: No stridor.     Comments: Patient on nasal cannula 2 L 100% Abdominal:     General: There is no distension.  Musculoskeletal:     Comments: BKA remaining foot has no substantial erythema, drainage  Skin:    General: Skin is warm and dry.  Neurological:     Mental Status: He is alert and oriented to person, place, and time.     ED Results / Procedures / Treatments   Labs (all labs ordered are listed, but only abnormal results are displayed) Labs  Reviewed  COMPREHENSIVE METABOLIC PANEL  ETHANOL  LIPASE, BLOOD  CBC WITH DIFFERENTIAL/PLATELET    EKG None  Radiology No results found.  Procedures Procedures    Medications Ordered in ED Medications  albuterol (PROVENTIL) (2.5 MG/3ML) 0.083% nebulizer solution 5 mg (has no administration in time range)    ED Course/ Medical Decision Making/ A&P This patient with a Hx of COPD, decreased cognition, alcohol abuse, recent hospitalization including intubation presents to the ED for concern of reported weakness, anorexia, though the patient himself denies complaints, this involves an extensive number of treatment  options, and is a complaint that carries with it a high risk of complications and morbidity.    The differential diagnosis includes COPD exacerbation, pneumonia, bacteremia, sepsis, relapse into substance abuse   Social Determinants of Health:  Cognitive impairment, age, COPD  Additional history obtained:  Additional history and/or information obtained from EMS details included above, rhythm strip with sinus tach, rate 106, intraventricular conduction delay, abnormal.  In addition, hospitalization reviewed, discharge summary and plan included below:  Hospital Course: [name] is a 67 y.o. Male with a PMH of COPD, ETOH and substance abuse, Left BKA, Smoker, who presented to the ED with hypoxia. Per family, pt was supposed to have a CXR completed today per PCP, but it was closed and when the patient's daughter picked him up, she noticed his WOB was increasing worse than normal. On arrival, he was noted to be disoriented to date and having audible wheezing, SHOB, and cough with Spo2 of 87%.    EDP concerned for alcohol withdrawal seizure but was too somnolent for sedation without requiring intubation. Therefore, pt was started on BiPAP and then intubated d/t worsening respiratory status. ED workup reveled Na 129, Chloride 86, WBC 14.7. VBG 7.269/72.4/35/33.2. CXR with no acute findings. He failed BiPAP and required intubation 12/21. Treated for pneumonia and COPD exacerbation. Ultimately extubated 12/25 and has had improved, normalized respiratory status since then. Discharge delayed due to adverse living situation discussed below.     Assessment and Plan: Disposition: Patient remains medically stable to leave the hospital once a safe venue is secured. CM has worked diligently and we've arranged maximal home health therapies and DME. We are paying for a locksmith to get him back in his house. His sisters have concerns about his living situation/house in disrepair. APS has been alerted of this  again (also evaluated back in August), has no current input to keep him from going home. Home health agency that worked with him in August is willing to come back to the same place now. We have recommended SNF, though he does not have insurance coverage for this. SNF and ALF would both be recommended, though the patient refuses to give up SS check and feels he'll be safe going home. The medical team felt that the patient had the mental capacity to make that medical decision at the time of discharge. This was corroborated by psychiatry evaluation as well.   The patient is at exceedingly high risk of readmission or poor health outcome, though we are unable to mitigate this risk any further. He certainly has maximized the benefit of inpatient management, so is discharged in stable condition.   After the initial evaluation, orders, including: Continuous oxygen monitoring bronchodilators labs were initiated.   Patient placed on Cardiac and Pulse-Oximetry Monitors. The patient was maintained on a cardiac monitor.  The cardiac monitored showed an rhythm of 105 sinus tach abnormal The patient was also maintained on pulse  oximetry. The readings were typically 99% 2 L nasal cannula abnormal   On repeat evaluation of the patient improved With 2 L nasal cannula patient saturation is 99%, he is more comfortable. Patient is, however, still quite somnolent.  VBG consistent with acute retention. Lab Tests:  I personally interpreted labs.  The pertinent results include: Labs notable for hyponatremia, 125, substantially lower than recent values from hospitalization  Imaging Studies ordered:  I independently visualized and interpreted imaging which showed no pneumonia on x-ray I agree with the radiologist interpretation   Dispostion / Final MDM:  After consideration of the diagnostic results and the patient's response to treatment, this adult male with multiple medical problems including COPD, substance  abuse, recent hospitalization including intubation now presents with dyspnea, weakness.  Patient is a poor historian, but evaluation is notable for demonstration of hyponatremia as well as a new oxygen requirement.  Some suspicion for the patient COPD contributing to his weakness, the patient required admission for monitoring, management.  Final Clinical Impression(s) / ED Diagnoses Final diagnoses:  COPD exacerbation (HCC)  Hyponatremia  Weakness  CRITICAL CARE Performed by: Gerhard Munch Total critical care time: 35 minutes Critical care time was exclusive of separately billable procedures and treating other patients. Critical care was necessary to treat or prevent imminent or life-threatening deterioration. Critical care was time spent personally by me on the following activities: development of treatment plan with patient and/or surrogate as well as nursing, discussions with consultants, evaluation of patient's response to treatment, examination of patient, obtaining history from patient or surrogate, ordering and performing treatments and interventions, ordering and review of laboratory studies, ordering and review of radiographic studies, pulse oximetry and re-evaluation of patient's condition.    Gerhard Munch, MD 09/19/22 Terence Lux    Gerhard Munch, MD 09/19/22 2040

## 2022-09-19 NOTE — Assessment & Plan Note (Signed)
Got 1 dose of steroids in ED. Not hearing any wheezing on my exam. Respiratory distress earlier per EDP note, but none now. CXR neg, no fever. Cont COPD nebs for the moment Will hold off on ABx or steroids for the moment

## 2022-09-19 NOTE — Assessment & Plan Note (Signed)
VBG shows him to be retaining CO2, but compensated (normal pH) today.

## 2022-09-19 NOTE — ED Notes (Signed)
Offered urinal and assisted pt to hold for several minutes. Unable to provide sample at this time for UA

## 2022-09-19 NOTE — H&P (Signed)
History and Physical    Patient: Ronald [name]:[id number] DOB: May 25, 1956 DOA: 09/19/2022 DOS: the patient was seen and examined on 09/19/2022 PCP: Demetrios Isaacs, FNP  Patient coming from: Home  Chief Complaint:  Chief Complaint  Patient presents with   Weakness   HPI: Ronald is a 67 y.o. male with medical history significant of COPD, EtOH and cocaine abuse, L BKA.  Pt frequently hyponatremic as he drinks only Beer, no other EtOH.  Pt with recent admission 12/21 for acute hypercapnic resp failure and EtOH withdrawal seizure.  Required intubation.  Able to come off of vent on 12/25.  Then spent another 11 days in hospital while medical team, family, all tried to convince him to go to ALF/SNF and that he couldn't take care of self at home.  Pt didn't want to lose independence and his SS check however, and so ultimately refused.  Pysch saw him, decided he had decision making capacity.  APS also involved.  But ultimately medicine had to let him make his poor decision and go home knowing that it wasn't going to work out.  Indeed it seems like this has worked out about as poorly as could be expected.  Pt initially denied drinking since discharge, family disagrees noting open beer cans in room when they found him today with AMS / generalized weakness.  Pt denies symptoms of EtOH withdrawal to me at this time.  Pt quite lethargic initially, able to wake up and talk some later on during ED evaluation.   Review of Systems: As mentioned in the history of present illness. All other systems reviewed and are negative. Past Medical History:  Diagnosis Date   Critical lower limb ischemia (HCC)    ETOH abuse    Gout    Hypertension    Past Surgical History:  Procedure Laterality Date   ABDOMINAL AORTAGRAM N/A 12/26/2013   Procedure: ABDOMINAL Maxcine Ham;  Surgeon: Serafina Mitchell, MD;  Location: [facility] CATH LAB;  Service: Cardiovascular;  Laterality: N/A;   AMPUTATION Left 12/28/2013    Procedure: LEFT BELOW KNEE AMPUTATION;  Surgeon: Newt Minion, MD;  Location: [facility];  Service: Orthopedics;  Laterality: Left;   BELOW KNEE LEG AMPUTATION     Left   LEG SURGERY     Social History:  reports that he has been smoking cigarettes. He has a 20.00 pack-year smoking history. He has never used smokeless tobacco. He reports current alcohol use of about 120.0 standard drinks of alcohol per week. He reports current drug use. Drugs: Cocaine and Marijuana.  No Known Allergies  Family History  Problem Relation Age of Onset   Diabetes Mellitus II Mother    CAD Neg Hx    Stroke Neg Hx     Prior to Admission medications   Medication Sig Start Date End Date Taking? Authorizing Provider  fluticasone furoate-vilanterol (BREO ELLIPTA) 100-25 MCG/ACT AEPB Inhale 1 puff into the lungs daily. 09/17/22   Patrecia Pour, MD  albuterol (VENTOLIN HFA) 108 (90 Base) MCG/ACT inhaler Inhale 2 puffs into the lungs every 6 (six) hours as needed for wheezing or shortness of breath. 09/17/22   Patrecia Pour, MD  amLODipine (NORVASC) 10 MG tablet Take 1 tablet (10 mg total) by mouth daily. 09/17/22   Patrecia Pour, MD  folic acid (FOLVITE) 1 MG tablet Take 1 tablet (1 mg total) by mouth daily. 04/17/22   Shelly Coss, MD  ibuprofen (ADVIL) 200 MG tablet Take 200 mg by mouth  as needed for mild pain.    [provider]  lisinopril (ZESTRIL) 10 MG tablet Take 1 tablet (10 mg total) by mouth daily. 09/17/22   Tyrone Nine, MD  thiamine (VITAMIN B1) 100 MG tablet Take 1 tablet (100 mg total) by mouth daily. 09/17/22   Tyrone Nine, MD  ADVAIR [facility] 230-21 MCG/ACT inhaler Inhale 2 puffs into the lungs 2 (two) times daily. 04/16/22 09/17/22  Burnadette Pop, MD    Physical Exam: Vitals:   09/19/22 2000 09/19/22 2020 09/19/22 2035 09/19/22 2038  BP: 113/78  124/64 124/64  Pulse: 87  87 87  Resp: 18  18   Temp:  98.6 F (37 C)    TempSrc:  Axillary    SpO2: 100%  100%    Constitutional: Ill appearing,  somewhat lethargic but wakes up and talks Eyes: PERRL, lids and conjunctivae normal ENMT: Mucous membranes are moist. Posterior pharynx clear of any exudate or lesions.Normal dentition.  Neck: normal, supple, no masses, no thyromegaly Respiratory: Rales L lower lung fields, no accessory muscle usage nor tachypnea at this time.  Satting 100% on 2L via [place]. Cardiovascular: Regular rate and rhythm, no murmurs / rubs / gallops. No extremity edema. 2+ pedal pulses. No carotid bruits.  Abdomen: no tenderness, no masses palpated. No hepatosplenomegaly. Bowel sounds positive.  Musculoskeletal: no clubbing / cyanosis. No joint deformity upper and lower extremities. Good ROM, no contractures. Normal muscle tone.  Neurologic: MAE, grossly non-focal Psychiatric: talking some, but somewhat lethargic.  Data Reviewed:    Venous Blood Gas result:  pO2 32; pCO2 63; pH 7.35;  HCO3 34.8, %O2 Sat 49.8.  COVID, FLU, RSV negative CXR nothing acute     Latest Ref Rng & Units 09/19/2022    6:01 PM 09/15/2022    2:00 AM 09/14/2022    3:00 AM  CBC  WBC 4.0 - 10.5 K/uL 9.6  7.8  7.9   Hemoglobin 13.0 - 17.0 g/dL 42.5  8.8  9.2   Hematocrit 39.0 - 52.0 % 30.7  26.0  27.7   Platelets 150 - 400 K/uL 633  589  550       Latest Ref Rng & Units 09/19/2022    6:01 PM 09/16/2022    5:25 AM 09/15/2022    2:00 AM  CMP  Glucose 70 - 99 mg/dL 93   97   BUN 8 - 23 mg/dL 5   7   Creatinine 9.56 - 1.24 mg/dL 3.87  5.64  3.32   Sodium 135 - 145 mmol/L 125   130   Potassium 3.5 - 5.1 mmol/L 4.2   3.8   Chloride 98 - 111 mmol/L 87   96   CO2 22 - 32 mmol/L 28   24   Calcium 8.9 - 10.3 mg/dL 8.7   8.2   Total Protein 6.5 - 8.1 g/dL 6.8     Total Bilirubin 0.3 - 1.2 mg/dL 0.7     Alkaline Phos 38 - 126 U/L 71     AST 15 - 41 U/L 17     ALT 0 - 44 U/L 18      ETOH < 10  Assessment and Plan: * Acute encephalopathy  Negative or unimpressive work up thus far: VBG Not in acute hypercapnic respiratory failure this time  around based on VBG (in contrast to last admission). CXR EtOH TSH nl just a couple of days ago CBC COVID, FLU, RSV Is hyponatremic; however: Wasn't this encephalopathic when I  saw him in Aug despite more severe hyponatremia, so not clear that hyponatremia causing. Treat and monitor hyponatremia as below Further work up being ordered right now: CT head Doesn't really seem to have focal deficits on exam Ammonia Was normal just a few days ago though UA UDS Usually his drug of choice though seems to be Cocaine, only CNS sedatives he's had on board in past were ones we gave him in hospital (ie phenobarbital during last admission prior to UDS 2 days later). Seizure precautions, tele monitor, and monitor for seizure activity Post ictal state in differential given h/o EtOH withdrawal seizure on 21st, but nothing witnessed.  Not in status epilepticus or having ongoing seizures at this time as he's able to wake up and talk a small amount, just drowsy.  Dont really want to add more CNS sedatives until we can confirm seizures clinically though. Discussed need for close monitoring with RN.   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 Not sure if todays is due to beer potomania, or due to dehydration however given he denies drinking since discharge 2 days ago. SIADH possible, but less likely, no h/o this despite frequent history of hyponatremia on admission in past. NS at 75 for the moment BMP Q6H  COPD (chronic obstructive pulmonary disease) (HCC) Got 1 dose of steroids in ED. Not hearing any wheezing on my exam. Respiratory distress earlier per EDP note, but none now. CXR neg, no fever. Cont COPD nebs for the moment Will hold off on ABx or steroids for the moment  ETOH abuse Pt denies any EtOH since discharge 2 days ago -> Family member arrives and tells a different story though  (at which patient admits he "doesn't know") EtOH level today in ED is <10 H/o withdrawal seizures just 3 weeks ago Will put pt on CIWA, but pt very sedated at the moment.  No witnessed seizures in ED. Will hold off on benzo orders for the moment. Tele monitor  Chronic hypercapnic respiratory failure (HCC) VBG shows him to be retaining CO2, but compensated (normal pH) today.  HTN (hypertension), benign Continue home BP meds for the moment      Advance Care Planning:   Code Status: Full Code  Consults: None  Family Communication: Family came in to see him at bedside  Severity of Illness: The appropriate patient status for this patient is OBSERVATION. Observation status is judged to be reasonable and necessary in order to provide the required intensity of service to ensure the patient's safety. The patient's presenting symptoms, physical exam findings, and initial radiographic and laboratory data in the context of their medical condition is felt to place them at decreased risk for further clinical deterioration. Furthermore, it is anticipated that the patient will be medically stable for discharge from the hospital within 2 midnights of admission.   Author: Hillary Bow., DO 09/19/2022 9:27 PM  For on call review [email or web address].

## 2022-09-19 NOTE — ED Triage Notes (Signed)
Ems brings pt in from home for generalized weakness. Pt recently discharged from the hospital and since then he has not been eating or drinking. Pt has been in bed since leaving the hospital.

## 2022-09-20 ENCOUNTER — Encounter (HOSPITAL_COMMUNITY): Payer: Self-pay | Admitting: Internal Medicine

## 2022-09-20 ENCOUNTER — Other Ambulatory Visit: Payer: Self-pay

## 2022-09-20 DIAGNOSIS — J441 Chronic obstructive pulmonary disease with (acute) exacerbation: Secondary | ICD-10-CM | POA: Diagnosis present

## 2022-09-20 DIAGNOSIS — G9341 Metabolic encephalopathy: Secondary | ICD-10-CM | POA: Diagnosis present

## 2022-09-20 DIAGNOSIS — I10 Essential (primary) hypertension: Secondary | ICD-10-CM | POA: Diagnosis not present

## 2022-09-20 DIAGNOSIS — E222 Syndrome of inappropriate secretion of antidiuretic hormone: Secondary | ICD-10-CM | POA: Diagnosis present

## 2022-09-20 DIAGNOSIS — R531 Weakness: Secondary | ICD-10-CM | POA: Diagnosis present

## 2022-09-20 DIAGNOSIS — E871 Hypo-osmolality and hyponatremia: Secondary | ICD-10-CM | POA: Diagnosis not present

## 2022-09-20 DIAGNOSIS — Z7951 Long term (current) use of inhaled steroids: Secondary | ICD-10-CM | POA: Diagnosis not present

## 2022-09-20 DIAGNOSIS — J9612 Chronic respiratory failure with hypercapnia: Secondary | ICD-10-CM | POA: Diagnosis present

## 2022-09-20 DIAGNOSIS — J449 Chronic obstructive pulmonary disease, unspecified: Secondary | ICD-10-CM | POA: Diagnosis not present

## 2022-09-20 DIAGNOSIS — F17210 Nicotine dependence, cigarettes, uncomplicated: Secondary | ICD-10-CM | POA: Diagnosis present

## 2022-09-20 DIAGNOSIS — F1410 Cocaine abuse, uncomplicated: Secondary | ICD-10-CM | POA: Diagnosis present

## 2022-09-20 DIAGNOSIS — G9340 Encephalopathy, unspecified: Secondary | ICD-10-CM | POA: Diagnosis not present

## 2022-09-20 DIAGNOSIS — Z79899 Other long term (current) drug therapy: Secondary | ICD-10-CM | POA: Diagnosis not present

## 2022-09-20 DIAGNOSIS — Z89512 Acquired absence of left leg below knee: Secondary | ICD-10-CM | POA: Diagnosis not present

## 2022-09-20 DIAGNOSIS — L89892 Pressure ulcer of other site, stage 2: Secondary | ICD-10-CM | POA: Diagnosis present

## 2022-09-20 DIAGNOSIS — F1010 Alcohol abuse, uncomplicated: Secondary | ICD-10-CM | POA: Diagnosis not present

## 2022-09-20 DIAGNOSIS — R1310 Dysphagia, unspecified: Secondary | ICD-10-CM | POA: Diagnosis not present

## 2022-09-20 DIAGNOSIS — Z833 Family history of diabetes mellitus: Secondary | ICD-10-CM | POA: Diagnosis not present

## 2022-09-20 DIAGNOSIS — Z1152 Encounter for screening for COVID-19: Secondary | ICD-10-CM | POA: Diagnosis not present

## 2022-09-20 DIAGNOSIS — D638 Anemia in other chronic diseases classified elsewhere: Secondary | ICD-10-CM | POA: Diagnosis present

## 2022-09-20 LAB — CBC
HCT: 30.1 % — ABNORMAL LOW (ref 39.0–52.0)
Hemoglobin: 10 g/dL — ABNORMAL LOW (ref 13.0–17.0)
MCH: 31.1 pg (ref 26.0–34.0)
MCHC: 33.2 g/dL (ref 30.0–36.0)
MCV: 93.5 fL (ref 80.0–100.0)
Platelets: 674 10*3/uL — ABNORMAL HIGH (ref 150–400)
RBC: 3.22 MIL/uL — ABNORMAL LOW (ref 4.22–5.81)
RDW: 14.1 % (ref 11.5–15.5)
WBC: 6.1 10*3/uL (ref 4.0–10.5)
nRBC: 0 % (ref 0.0–0.2)

## 2022-09-20 LAB — BASIC METABOLIC PANEL
Anion gap: 10 (ref 5–15)
Anion gap: 9 (ref 5–15)
Anion gap: 9 (ref 5–15)
Anion gap: 9 (ref 5–15)
BUN: 12 mg/dL (ref 8–23)
BUN: 16 mg/dL (ref 8–23)
BUN: 6 mg/dL — ABNORMAL LOW (ref 8–23)
BUN: 8 mg/dL (ref 8–23)
CO2: 25 mmol/L (ref 22–32)
CO2: 27 mmol/L (ref 22–32)
CO2: 28 mmol/L (ref 22–32)
CO2: 29 mmol/L (ref 22–32)
Calcium: 8.5 mg/dL — ABNORMAL LOW (ref 8.9–10.3)
Calcium: 8.5 mg/dL — ABNORMAL LOW (ref 8.9–10.3)
Calcium: 8.6 mg/dL — ABNORMAL LOW (ref 8.9–10.3)
Calcium: 8.6 mg/dL — ABNORMAL LOW (ref 8.9–10.3)
Chloride: 89 mmol/L — ABNORMAL LOW (ref 98–111)
Chloride: 91 mmol/L — ABNORMAL LOW (ref 98–111)
Chloride: 91 mmol/L — ABNORMAL LOW (ref 98–111)
Chloride: 92 mmol/L — ABNORMAL LOW (ref 98–111)
Creatinine, Ser: 0.45 mg/dL — ABNORMAL LOW (ref 0.61–1.24)
Creatinine, Ser: 0.46 mg/dL — ABNORMAL LOW (ref 0.61–1.24)
Creatinine, Ser: 0.59 mg/dL — ABNORMAL LOW (ref 0.61–1.24)
Creatinine, Ser: 0.81 mg/dL (ref 0.61–1.24)
GFR, Estimated: >60 mL/min (ref >60)
GFR, Estimated: >60 mL/min (ref >60)
GFR, Estimated: >60 mL/min (ref >60)
GFR, Estimated: >60 mL/min (ref >60)
Glucose, Bld: 109 mg/dL — ABNORMAL HIGH (ref 70–99)
Glucose, Bld: 94 mg/dL (ref 70–99)
Glucose, Bld: 96 mg/dL (ref 70–99)
Glucose, Bld: 99 mg/dL (ref 70–99)
Potassium: 3.8 mmol/L (ref 3.5–5.1)
Potassium: 3.9 mmol/L (ref 3.5–5.1)
Potassium: 4.2 mmol/L (ref 3.5–5.1)
Potassium: 4.5 mmol/L (ref 3.5–5.1)
Sodium: 127 mmol/L — ABNORMAL LOW (ref 135–145)
Sodium: 127 mmol/L — ABNORMAL LOW (ref 135–145)
Sodium: 127 mmol/L — ABNORMAL LOW (ref 135–145)
Sodium: 128 mmol/L — ABNORMAL LOW (ref 135–145)

## 2022-09-20 LAB — URINALYSIS, ROUTINE W REFLEX MICROSCOPIC
Bilirubin Urine: NEGATIVE
Glucose, UA: NEGATIVE mg/dL
Hgb urine dipstick: NEGATIVE
Ketones, ur: NEGATIVE mg/dL
Leukocytes,Ua: NEGATIVE
Nitrite: NEGATIVE
Protein, ur: NEGATIVE mg/dL
Specific Gravity, Urine: 1.003 — ABNORMAL LOW (ref 1.005–1.030)
pH: 7 (ref 5.0–8.0)

## 2022-09-20 LAB — RAPID URINE DRUG SCREEN, HOSP PERFORMED
Amphetamines: NOT DETECTED
Barbiturates: POSITIVE — AB
Benzodiazepines: NOT DETECTED
Cocaine: POSITIVE — AB
Opiates: NOT DETECTED
Tetrahydrocannabinol: NOT DETECTED

## 2022-09-20 MED ORDER — LORAZEPAM 1 MG PO TABS
1.0000 mg | ORAL_TABLET | ORAL | Status: AC | PRN
  Administered 2022-09-22: 1 mg via ORAL
  Filled 2022-09-20: qty 1

## 2022-09-20 MED ORDER — LORAZEPAM 1 MG PO TABS: 1.0000 mg | ORAL_TABLET | Freq: Four times a day (QID) | ORAL | Status: AC | PRN

## 2022-09-20 MED ORDER — LORAZEPAM 2 MG/ML IJ SOLN
0.0000 mg | Freq: Two times a day (BID) | INTRAMUSCULAR | Status: AC
  Administered 2022-09-23: 1 mg via INTRAVENOUS
  Filled 2022-09-20: qty 1

## 2022-09-20 MED ORDER — LORAZEPAM 2 MG/ML IJ SOLN
0.0000 mg | Freq: Four times a day (QID) | INTRAMUSCULAR | Status: AC
  Administered 2022-09-20: 2 mg via INTRAVENOUS
  Administered 2022-09-20 – 2022-09-21 (×2): 1 mg via INTRAVENOUS
  Administered 2022-09-21 – 2022-09-22 (×2): 2 mg via INTRAVENOUS
  Filled 2022-09-20 (×5): qty 1

## 2022-09-20 NOTE — Progress Notes (Signed)
PROGRESS NOTE  Ronald [name] ZOX:[id number] DOB: 1955-11-10 DOA: 09/19/2022 PCP: Donato Schultz, FNP   HPI/Recap of past 24 hours: Ronald [name] is a 67 y.o. male with medical history significant of COPD, EtOH and cocaine abuse, L BKA. Pt is frequently hyponatremic as he drinks only Beer, no other EtOH. Pt with recent admission 12/21 for acute hypercapnic resp failure and EtOH withdrawal seizure.  Required intubation.  Able to come off of vent on 12/25.  Then spent another 11 days in hospital while medical team, family, all tried to convince him to go to ALF/SNF, patient refused and went home (lives with some roommates)Pysch saw him, decided he had decision making capacity.  APS also involved. During this admission, pt initially denied drinking since discharge, family disagrees noting open beer cans in room when they found him with AMS / generalized weakness. In the ED, pt was found to hyponatremia and admitted for further management.    Today, pt very irritable, yelling at me. Stated he wants some cigarette and beer. Reported his shakes are always there. Could be withdrawing.      Assessment/Plan: Principal Problem:   Acute encephalopathy Active Problems:   Hyponatremia   ETOH abuse   COPD (chronic obstructive pulmonary disease) (HCC)   HTN (hypertension), benign   Chronic hypercapnic respiratory failure (HCC)   Pressure Injury 09/05/22 Elbow Left;Posterior Stage 2 -  Partial thickness loss of dermis presenting as a shallow open injury with a red, pink wound bed without slough. (Active)  09/05/22 0800  Location: Elbow  Location Orientation: Left;Posterior  Staging: Stage 2 -  Partial thickness loss of dermis presenting as a shallow open injury with a red, pink wound bed without slough.  Wound Description (Comments):   Present on Admission:   Dressing Type Foam - Lift dressing to assess site every shift 09/20/22 0845     Pressure Injury Ischial tuberosity  Left;Posterior;Proximal Stage 2 -  Partial thickness loss of dermis presenting as a shallow open injury with a red, pink wound bed without slough. (Active)     Location: Ischial tuberosity  Location Orientation: Left;Posterior;Proximal  Staging: Stage 2 -  Partial thickness loss of dermis presenting as a shallow open injury with a red, pink wound bed without slough.  Wound Description (Comments):   Present on Admission:   Dressing Type Foam - Lift dressing to assess site every shift 09/20/22 0845    Hyponatremia Likely 2/2 beer potomania Continue gentle hydration Frequent BMP  Acute metabolic encephalopathy Improving, but very irritable Flu, RSV, COVID all negative UA negative UDS positive for cocaine, barbiturates, alcohol level less than 10 CT head unremarkable Chest x-ray unremarkable Seizure and fall precautions  Polysubstance abuse  Alcohol and cocaine abuse History of withdrawal seizures about 3 weeks ago Placed on CIWA Telemonitoring Seizure and fall precautions  Hypertension Continue amlodipine, lisinopril  COPD Continue inhalers      Estimated body mass index is 21.49 kg/m as calculated from the following:   Height as of 09/02/22: 5\' 4"  (1.626 m).   Weight as of 09/17/22: 56.8 kg.     Code Status: Full  Family Communication: None at bedside  Disposition Plan: Status is: Inpatient Remains inpatient appropriate because: level of care      Consultants: None  Procedures: None  Antimicrobials:  None  DVT prophylaxis: Lovenox    Objective: Vitals:   09/20/22 0500 09/20/22 0700 09/20/22 0941 09/20/22 1312  BP: 130/84   124/69  Pulse: 89   (!) 102  Resp:  16  20  Temp: 98.9 F (37.2 C)   98.8 F (37.1 C)  TempSrc: Oral   Oral  SpO2: 94%  (!) 87% 97%    Intake/Output Summary (Last 24 hours) at 09/20/2022 1327 Last data filed at 09/20/2022 0840 Gross per 24 hour  Intake 751.87 ml  Output 1325 ml  Net -573.13 ml   There were no vitals  filed for this visit.  Exam: General: NAD, irritable Cardiovascular: S1, S2 present Respiratory: Diminished BS b/l Abdomen: Soft, nontender, nondistended, bowel sounds present Musculoskeletal: No bilateral pedal edema noted Skin: Very dry Psychiatry: Normal mood     Data Reviewed: CBC: Recent Labs  Lab 09/14/22 0300 09/15/22 0200 09/19/22 1801 09/20/22 0428  WBC 7.9 7.8 9.6 6.1  NEUTROABS  --   --  7.4  --   HGB 9.2* 8.8* 10.4* 10.0*  HCT 27.7* 26.0* 30.7* 30.1*  MCV 92.6 92.2 91.6 93.5  PLT 550* 589* 633* 674*   Basic Metabolic Panel: Recent Labs  Lab 09/14/22 0300 09/15/22 0200 09/16/22 0525 09/19/22 1801 09/19/22 2347 09/20/22 0428 09/20/22 1153  NA 131* 130*  --  125* 127* 127* 128*  K 3.9 3.8  --  4.2 4.2 4.5 3.9  CL 97* 96*  --  87* 89* 91* 91*  CO2 25 24  --  28 29 27 28   GLUCOSE 88 97  --  93 94 109* 96  BUN 5* 7*  --  5* 6* 8 12  CREATININE 0.48* 0.50* 0.50* 0.39* 0.45* 0.46* 0.59*  CALCIUM 8.3* 8.2*  --  8.7* 8.6* 8.5* 8.6*  MG 1.6* 1.5*  --   --   --   --   --   PHOS 3.1 3.8  --   --   --   --   --    GFR: Estimated Creatinine Clearance: 74 mL/min (A) (by C-G formula based on SCr of 0.59 mg/dL (L)). Liver Function Tests: Recent Labs  Lab 09/19/22 1801  AST 17  ALT 18  ALKPHOS 71  BILITOT 0.7  PROT 6.8  ALBUMIN 3.1*   Recent Labs  Lab 09/19/22 1801  LIPASE 38   Recent Labs  Lab 09/19/22 2140  AMMONIA 43*   Coagulation Profile: No results for input(s): "INR", "PROTIME" in the last 168 hours. Cardiac Enzymes: No results for input(s): "CKTOTAL", "CKMB", "CKMBINDEX", "TROPONINI" in the last 168 hours. BNP (last 3 results) No results for input(s): "PROBNP" in the last 8760 hours. HbA1C: No results for input(s): "HGBA1C" in the last 72 hours. CBG: Recent Labs  Lab 09/14/22 1144 09/14/22 1721 09/15/22 0739 09/15/22 1153 09/15/22 1556  GLUCAP 154* 127* 96 106* 133*   Lipid Profile: No results for input(s): "CHOL", "HDL",  "LDLCALC", "TRIG", "CHOLHDL", "LDLDIRECT" in the last 72 hours. Thyroid Function Tests: No results for input(s): "TSH", "T4TOTAL", "FREET4", "T3FREE", "THYROIDAB" in the last 72 hours. Anemia Panel: No results for input(s): "VITAMINB12", "FOLATE", "FERRITIN", "TIBC", "IRON", "RETICCTPCT" in the last 72 hours. Urine analysis:    Component Value Date/Time   COLORURINE STRAW (A) 09/20/2022 0229   APPEARANCEUR CLEAR 09/20/2022 0229   LABSPEC 1.003 (L) 09/20/2022 0229   PHURINE 7.0 09/20/2022 0229   GLUCOSEU NEGATIVE 09/20/2022 0229   HGBUR NEGATIVE 09/20/2022 0229   BILIRUBINUR NEGATIVE 09/20/2022 0229   KETONESUR NEGATIVE 09/20/2022 0229   PROTEINUR NEGATIVE 09/20/2022 0229   NITRITE NEGATIVE 09/20/2022 0229   LEUKOCYTESUR NEGATIVE 09/20/2022 0229   Sepsis Labs: @LABRCNTIP (procalcitonin:4,lacticidven:4)  ) Recent Results (from the  past 240 hour(s))  Resp panel by RT-PCR (RSV, Flu A&B, Covid) Anterior Nasal Swab     Status: None   Collection Time: 09/19/22  5:40 PM   Specimen: Anterior Nasal Swab  Result Value Ref Range Status   SARS Coronavirus 2 by RT PCR NEGATIVE NEGATIVE Final    Comment: (NOTE) SARS-CoV-2 target nucleic acids are NOT DETECTED.  The SARS-CoV-2 RNA is generally detectable in upper respiratory specimens during the acute phase of infection. The lowest concentration of SARS-CoV-2 viral copies this assay can detect is 138 copies/mL. A negative result does not preclude SARS-Cov-2 infection and should not be used as the sole basis for treatment or other patient management decisions. A negative result may occur with  improper specimen collection/handling, submission of specimen other than nasopharyngeal swab, presence of viral mutation(s) within the areas targeted by this assay, and inadequate number of viral copies(<138 copies/mL). A negative result must be combined with clinical observations, patient history, and epidemiological information. The expected result  is Negative.  Fact Sheet for Patients:  EntrepreneurPulse.com.au  Fact Sheet for Healthcare Providers:  IncredibleEmployment.be  This test is no t yet approved or cleared by the [place] FDA and  has been authorized for detection and/or diagnosis of SARS-CoV-2 by FDA under an Emergency Use Authorization (EUA). This EUA will remain  in effect (meaning this test can be used) for the duration of the COVID-19 declaration under Section 564(b)(1) of the Act, 21 U.S.C.section 360bbb-3(b)(1), unless the authorization is terminated  or revoked sooner.       Influenza A by PCR NEGATIVE NEGATIVE Final   Influenza B by PCR NEGATIVE NEGATIVE Final    Comment: (NOTE) The Xpert Xpress SARS-CoV-2/FLU/RSV plus assay is intended as an aid in the diagnosis of influenza from Nasopharyngeal swab specimens and should not be used as a sole basis for treatment. Nasal washings and aspirates are unacceptable for Xpert Xpress SARS-CoV-2/FLU/RSV testing.  Fact Sheet for Patients: EntrepreneurPulse.com.au  Fact Sheet for Healthcare Providers: IncredibleEmployment.be  This test is not yet approved or cleared by the [place] FDA and has been authorized for detection and/or diagnosis of SARS-CoV-2 by FDA under an Emergency Use Authorization (EUA). This EUA will remain in effect (meaning this test can be used) for the duration of the COVID-19 declaration under Section 564(b)(1) of the Act, 21 U.S.C. section 360bbb-3(b)(1), unless the authorization is terminated or revoked.     Resp Syncytial Virus by PCR NEGATIVE NEGATIVE Final    Comment: (NOTE) Fact Sheet for Patients: EntrepreneurPulse.com.au  Fact Sheet for Healthcare Providers: IncredibleEmployment.be  This test is not yet approved or cleared by the [place] FDA and has been authorized for detection and/or diagnosis of  SARS-CoV-2 by FDA under an Emergency Use Authorization (EUA). This EUA will remain in effect (meaning this test can be used) for the duration of the COVID-19 declaration under Section 564(b)(1) of the Act, 21 U.S.C. section 360bbb-3(b)(1), unless the authorization is terminated or revoked.  Performed at [facility], [place] [address] [place], [place] [address]       Studies: CT HEAD WO CONTRAST (5MM)  Result Date: 09/19/2022 CLINICAL DATA:  Generalized weakness, not eating or drinking EXAM: CT HEAD WITHOUT CONTRAST TECHNIQUE: Contiguous axial images were obtained from the base of the skull through the vertex without intravenous contrast. RADIATION DOSE REDUCTION: This exam was performed according to the departmental dose-optimization program which includes automated exposure control, adjustment of the mA and/or kV according to patient size and/or  use of iterative reconstruction technique. COMPARISON:  09/02/2022 FINDINGS: Brain: No evidence of acute infarction, hemorrhage, mass, mass effect, or midline shift. No hydrocephalus or extra-axial fluid collection. Mildly advanced cerebral atrophy for age. Redemonstrated remote lacunar infarct in the left basal ganglia. Periventricular white matter changes, likely the sequela of chronic small vessel ischemic disease. Vascular: No hyperdense vessel. Atherosclerotic calcifications in the intracranial carotid and vertebral arteries. Skull: Normal. Negative for fracture or focal lesion. Sinuses/Orbits: Overall clear paranasal sinuses. Remote right lamina papyracea defect. No acute finding in the orbits. Other: The mastoid air cells are well aerated. IMPRESSION: No acute intracranial process. Electronically Signed   By: Wiliam Ke M.D.   On: 09/19/2022 22:17   DG Chest Port 1 View  Result Date: 09/19/2022 CLINICAL DATA:  Shortness of breath.  Cough. EXAM: PORTABLE CHEST 1 VIEW COMPARISON:  None Available. FINDINGS: The heart size and  mediastinal contours are within normal limits. Atherosclerotic calcification of the aortic arch. Both lungs are clear. The visualized skeletal structures are unremarkable. IMPRESSION: No active disease. Electronically Signed   By: Larose Hires D.O.   On: 09/19/2022 17:08    Scheduled Meds:  amLODipine  10 mg Oral Daily   enoxaparin (LOVENOX) injection  40 mg Subcutaneous Daily   fluticasone furoate-vilanterol  1 puff Inhalation Daily   folic acid  1 mg Oral Daily   lisinopril  10 mg Oral Daily   multivitamin with minerals  1 tablet Oral Daily   thiamine  100 mg Oral Daily   Or   thiamine  100 mg Intravenous Daily    Continuous Infusions:  sodium chloride 75 mL/hr at 09/20/22 0840     LOS: 0 days     Briant Cedar, MD [facility]  If 7PM-7AM, please contact night-coverage [email or web address] 09/20/2022, 1:27 PM

## 2022-09-20 NOTE — TOC Initial Note (Signed)
Transition of Care [facility]) - Initial/Assessment Note    Patient Details  Name: [name] MRN: [id number] Date of Birth: 23-May-1956  Transition of [facility]) CM/SW Contact:    Illene Regulus, LCSW Phone Number: 09/20/2022, 1:52 PM  Clinical Narrative:                  Pt agreeable with substance abuse resources added to AVS. No additional needs .       Patient Goals and CMS Choice            Expected Discharge Plan and Services                                              Prior Living Arrangements/Services                       Activities of Daily Living      Permission Sought/Granted                  Emotional Assessment              Admission diagnosis:  Hyponatremia [E87.1] Weakness [R53.1] COPD exacerbation (HCC) [J44.1] Acute encephalopathy [G93.40] Patient Active Problem List   Diagnosis Date Noted   Chronic hypercapnic respiratory failure ([place]) 09/19/2022   Protein-calorie malnutrition, severe 09/04/2022   Acute hypercapnic respiratory failure ([place]) 09/02/2022   Hx of BKA, left ([place]) 09/02/2022   Hypotension 09/02/2022   Right bundle branch block 09/02/2022   Acute respiratory failure ([place]) 09/02/2022   Hyponatremia 03/26/2019   Altered mental status 03/26/2019   Acute hypoxemic respiratory failure ([place]) 03/26/2019   Dysphagia 03/26/2019   Acute encephalopathy 03/18/2019   COPD exacerbation ([place]) 08/29/2017   Subdural hematoma ([place]) 06/19/2014   Critical lower limb ischemia ([place]) 12/25/2013   Osteomyelitis ([place]) 11/06/2013   HTN (hypertension), benign 11/06/2013   ETOH abuse 11/06/2013   Osteomyelitis of left foot ([place]) 11/06/2013   COPD (chronic obstructive pulmonary disease) ([place]) 11/06/2013   Accelerated hypertension 09/25/2013   Hypokalemia 09/25/2013   Abscess of left foot 09/21/2013   Cellulitis of left foot 09/19/2013   PCP:  Demetrios Isaacs, FNP Pharmacy:   [id number] -  [place], [place] [address] [place] [address] Phone: [phone] Fax: [phone]  [facility] [address] [place] [address] Phone: [phone] Fax: [phone]     Social Determinants of Health (SDOH) Social History: SDOH Screenings   Food Insecurity: Food Insecurity Present (09/17/2022)  Housing: Medium Risk (09/17/2022)  Transportation Needs: Unmet Transportation Needs (09/17/2022)  Utilities: Not At Risk (09/17/2022)  Tobacco Use: High Risk (04/15/2022)   SDOH Interventions:     Readmission Risk Interventions     No data to display

## 2022-09-20 NOTE — Evaluation (Signed)
Clinical/Bedside Swallow Evaluation Patient Details  Name: Ronald [name] MRN: [id number] Date of Birth: 17-Jan-1956  Today's Date: 09/20/2022 Time: SLP Start Time (ACUTE ONLY): 1430 SLP Stop Time (ACUTE ONLY): 1450 SLP Time Calculation (min) (ACUTE ONLY): 20 min  Past Medical History:  Past Medical History:  Diagnosis Date   Critical lower limb ischemia ([place])    ETOH abuse    Gout    Hypertension    Past Surgical History:  Past Surgical History:  Procedure Laterality Date   ABDOMINAL AORTAGRAM N/A 12/26/2013   Procedure: ABDOMINAL Maxcine Ham;  Surgeon: Serafina Mitchell, MD;  Location: [facility] CATH LAB;  Service: Cardiovascular;  Laterality: N/A;   AMPUTATION Left 12/28/2013   Procedure: LEFT BELOW KNEE AMPUTATION;  Surgeon: Newt Minion, MD;  Location: [place];  Service: Orthopedics;  Laterality: Left;   BELOW KNEE LEG AMPUTATION     Left   LEG SURGERY     HPI:  Patient is a 67 y.o. male with PMH: COPD, daily smoker, alcohol abuse, cocaine abuse left BKA who was recently admitted at [facility] 09/02/22-09/17/22 with dyspnea, increased WOB and wheezing, requiring intubation. He presented to [facility] ED on 09/19/22 after being found with AMS and generalized weakness by family. Patient denied ETOH use but family doubted this as they found open beer cans in his room. In ED patient was lethargic initially but this improved; SpO2 100% on 2L via [place], lungs with rales left lower lung field but no accessory muscle usage and not tachypneaic. He was admitted with acute encephalopathy. On 09/20/22, RN requesting SLP swallow evaluation due to patient coughing excessively, mainly during PO intake.    Assessment / Plan / Recommendation  Clinical Impression  Patient presents with clinical s/s of dysphagia as per this bedside swallow evaluation. He exhibited delayed coughing during PO intake of thin liquids, nectar thick liquids, regular solids and difficult to determine if a particular PO consistency is causing this. Patient's  cough does sound like a reflux cough and with patient's h/o ETOH and cocaine abuse, SLP suspects some component of esophageal phase dysphagia as well as pharyngeal. SLP recommending to change patient's diet to full liquids and will proceed with objective swallow evaluation (MBS) next date. Patient may be changed to NPO if coughing persists. SLP Visit Diagnosis: Dysphagia, unspecified (R13.10)    Aspiration Risk  Moderate aspiration risk;Risk for inadequate nutrition/hydration    Diet Recommendation Thin liquid;Other (Comment) (full liquids)   Medication Administration: Whole meds with puree Compensations: Minimize environmental distractions;Slow rate;Small sips/bites    Other  Recommendations Oral Care Recommendations: Staff/trained caregiver to provide oral care;Oral care BID    Recommendations for follow up therapy are one component of a multi-disciplinary discharge planning process, led by the attending physician.  Recommendations may be updated based on patient status, additional functional criteria and insurance authorization.  Follow up Recommendations Other (comment) (TBD)      Assistance Recommended at Discharge    Functional Status Assessment Patient has had a recent decline in their functional status and demonstrates the ability to make significant improvements in function in a reasonable and predictable amount of time.  Frequency and Duration min 2x/week  2 weeks       Prognosis Prognosis for Safe Diet Advancement: Good Barriers to Reach Goals: Cognitive deficits;Time post onset Barriers/Prognosis Comment: sister      Swallow Study   General Date of Onset: 09/20/22 HPI: Patient is a 67 y.o. male with PMH: COPD, daily smoker, alcohol abuse, cocaine abuse left BKA  who was recently admitted at [facility] 09/02/22-09/17/22 with dyspnea, increased WOB and wheezing, requiring intubation. He presented to [facility] ED on 09/19/22 after being found with AMS and generalized weakness by family.  Patient denied ETOH use but family doubted this as they found open beer cans in his room. In ED patient was lethargic initially but this improved; SpO2 100% on 2L via [place], lungs with rales left lower lung field but no accessory muscle usage and not tachypneaic. He was admitted with acute encephalopathy. On 09/20/22, RN requesting SLP swallow evaluation due to patient coughing excessively, mainly during PO intake. Type of Study: Bedside Swallow Evaluation Previous Swallow Assessment: during recent past admission 09/07/22 (BSE) Diet Prior to this Study: Regular;Thin liquids Temperature Spikes Noted: No Respiratory Status: Room air History of Recent Intubation: Yes Length of Intubations (days): 5 days Date extubated: 09/06/22 Behavior/Cognition: Cooperative;Requires cueing;Alert Oral Cavity Assessment: Within Functional Limits Oral Care Completed by SLP: No Oral Cavity - Dentition: Adequate natural dentition Vision: Functional for self-feeding Self-Feeding Abilities: Able to feed self Patient Positioning: Upright in bed Baseline Vocal Quality: Normal Volitional Cough: Strong Volitional Swallow: Able to elicit    Oral/Motor/Sensory Function Overall Oral Motor/Sensory Function: Within functional limits   Ice Chips     Thin Liquid Thin Liquid: Impaired Presentation: Straw Pharyngeal  Phase Impairments: Cough - Delayed    Nectar Thick Nectar Thick Liquid: Impaired Presentation: Straw Pharyngeal Phase Impairments: Cough - Delayed   Honey Thick     Puree Puree: Not tested   Solid     Solid: Impaired Pharyngeal Phase Impairments: Cough - Delayed      Angela Nevin, MA, CCC-SLP Speech Therapy

## 2022-09-20 NOTE — Progress Notes (Signed)
Patient saturation at 94% on room air; pt refused continuous pulse ox;

## 2022-09-21 DIAGNOSIS — G9340 Encephalopathy, unspecified: Secondary | ICD-10-CM | POA: Diagnosis not present

## 2022-09-21 LAB — CBC
HCT: 27 % — ABNORMAL LOW (ref 39.0–52.0)
Hemoglobin: 9 g/dL — ABNORMAL LOW (ref 13.0–17.0)
MCH: 31.1 pg (ref 26.0–34.0)
MCHC: 33.3 g/dL (ref 30.0–36.0)
MCV: 93.4 fL (ref 80.0–100.0)
Platelets: 645 10*3/uL — ABNORMAL HIGH (ref 150–400)
RBC: 2.89 MIL/uL — ABNORMAL LOW (ref 4.22–5.81)
RDW: 14.4 % (ref 11.5–15.5)
WBC: 6.7 10*3/uL (ref 4.0–10.5)
nRBC: 0 % (ref 0.0–0.2)

## 2022-09-21 LAB — BASIC METABOLIC PANEL
Anion gap: 8 (ref 5–15)
Anion gap: 9 (ref 5–15)
BUN: 10 mg/dL (ref 8–23)
BUN: 14 mg/dL (ref 8–23)
CO2: 26 mmol/L (ref 22–32)
CO2: 28 mmol/L (ref 22–32)
Calcium: 8.5 mg/dL — ABNORMAL LOW (ref 8.9–10.3)
Calcium: 8.6 mg/dL — ABNORMAL LOW (ref 8.9–10.3)
Chloride: 95 mmol/L — ABNORMAL LOW (ref 98–111)
Chloride: 96 mmol/L — ABNORMAL LOW (ref 98–111)
Creatinine, Ser: 0.46 mg/dL — ABNORMAL LOW (ref 0.61–1.24)
Creatinine, Ser: 0.64 mg/dL (ref 0.61–1.24)
GFR, Estimated: >60 mL/min (ref >60)
GFR, Estimated: >60 mL/min (ref >60)
Glucose, Bld: 114 mg/dL — ABNORMAL HIGH (ref 70–99)
Glucose, Bld: 95 mg/dL (ref 70–99)
Potassium: 3.3 mmol/L — ABNORMAL LOW (ref 3.5–5.1)
Potassium: 3.5 mmol/L (ref 3.5–5.1)
Sodium: 130 mmol/L — ABNORMAL LOW (ref 135–145)
Sodium: 132 mmol/L — ABNORMAL LOW (ref 135–145)

## 2022-09-21 MED ORDER — PANTOPRAZOLE SODIUM 40 MG PO TBEC
40.0000 mg | DELAYED_RELEASE_TABLET | Freq: Every day | ORAL | Status: DC
  Administered 2022-09-21 – 2022-09-30 (×10): 40 mg via ORAL
  Filled 2022-09-21 (×10): qty 1

## 2022-09-21 NOTE — TOC Progression Note (Addendum)
Transition of Care [facility]) - Progression Note    Patient Details  Name: [name] MRN: [id number] Date of Birth: 04-May-1956  [facility]) CM/SW [place], LCSW Phone Number: 09/21/2022, 1:32 PM  Clinical Narrative:     CSW received a message from [place] with [employer] regarding to pt's wheel chair that was ordered last admission. She reported it will be delivered to the pt's room prior to d/c. TOC to follow   ADDEN 2:00pm CSW received a message from [place] with [employer] , she reported pt received his wheelchair on 1/5. No wheel chair will be delivered to room. TOC continue to follow for d/c needs.       Expected Discharge Plan and Services                                               Social Determinants of Health (SDOH) Interventions SDOH Screenings   Food Insecurity: No Food Insecurity (09/20/2022)  Recent Concern: Food Insecurity - Food Insecurity Present (09/17/2022)  Housing: Medium Risk (09/20/2022)  Transportation Needs: Unmet Transportation Needs (09/20/2022)  Utilities: Not At Risk (09/20/2022)  Tobacco Use: High Risk (09/20/2022)    Readmission Risk Interventions     No data to display

## 2022-09-21 NOTE — Progress Notes (Signed)
PROGRESS NOTE  Ronald [name] [id number]:[id number] DOB: December 17, 1955 DOA: 09/19/2022 PCP: Donato Schultz, FNP   HPI/Recap of past 24 hours: Ronald [name] is a 67 y.o. male with medical history significant of COPD, EtOH and cocaine abuse, L BKA. Pt is frequently hyponatremic as he drinks only Beer, no other EtOH. Pt with recent admission 12/21 for acute hypercapnic resp failure and EtOH withdrawal seizure.  Required intubation.  Able to come off of vent on 12/25.  Then spent another 11 days in hospital while medical team, family, all tried to convince him to go to ALF/SNF, patient refused and went home (lives with some roommates)Pysch saw him, decided he had decision making capacity.  APS also involved. During this admission, pt initially denied drinking since discharge, family disagrees noting open beer cans in room when they found him with AMS / generalized weakness. In the ED, pt was found to hyponatremia and admitted for further management.    Today, pt noted to be coughing excessively with PO intake.  Patient scheduled for MBS with speech     Assessment/Plan: Principal Problem:   Acute encephalopathy Active Problems:   Hyponatremia   ETOH abuse   COPD (chronic obstructive pulmonary disease) (HCC)   HTN (hypertension), benign   Chronic hypercapnic respiratory failure (HCC)   Pressure Injury 09/05/22 Elbow Left;Posterior Stage 2 -  Partial thickness loss of dermis presenting as a shallow open injury with a red, pink wound bed without slough. (Active)  09/05/22 0800  Location: Elbow  Location Orientation: Left;Posterior  Staging: Stage 2 -  Partial thickness loss of dermis presenting as a shallow open injury with a red, pink wound bed without slough.  Wound Description (Comments):   Present on Admission:   Dressing Type Foam - Lift dressing to assess site every shift 09/20/22 0845     Pressure Injury Ischial tuberosity Left;Posterior;Proximal Stage 2 -  Partial thickness loss of  dermis presenting as a shallow open injury with a red, pink wound bed without slough. (Active)     Location: Ischial tuberosity  Location Orientation: Left;Posterior;Proximal  Staging: Stage 2 -  Partial thickness loss of dermis presenting as a shallow open injury with a red, pink wound bed without slough.  Wound Description (Comments):   Present on Admission:   Dressing Type Foam - Lift dressing to assess site every shift 09/20/22 0845    Hyponatremia Improving Likely 2/2 beer potomania S/p IV gentle hydration Daily BMP  Acute metabolic encephalopathy Resolved Flu, RSV, COVID all negative UA negative UDS positive for cocaine, barbiturates, alcohol level less than 10 CT head unremarkable Chest x-ray unremarkable Seizure and fall precautions  Polysubstance abuse  Alcohol and cocaine abuse History of withdrawal seizures about 3 weeks ago Placed on CIWA Telemonitoring Seizure and fall precautions  Hypertension Continue amlodipine, lisinopril  COPD Continue inhalers  ??Dysphagia Noted to be excessively coughing with p.o. intake SLP recommended MBS Full liquid diet for now Started on PPI     Estimated body mass index is 21.49 kg/m as calculated from the following:   Height as of 09/02/22: 5\' 4"  (1.626 m).   Weight as of 09/17/22: 56.8 kg.     Code Status: Full  Family Communication: None at bedside  Disposition Plan: Status is: Inpatient Remains inpatient appropriate because: level of care      Consultants: None  Procedures: None  Antimicrobials:  None  DVT prophylaxis: Lovenox    Objective: Vitals:   09/21/22 0554 09/21/22 0832 09/21/22 1500 09/21/22 1600  BP: (!) 156/100  (!) 148/89 (!) 150/92  Pulse: 88  90 92  Resp: 19  20   Temp: 97.7 F (36.5 C)  98.3 F (36.8 C)   TempSrc: Oral  Oral   SpO2: 97% 93% 100%     Intake/Output Summary (Last 24 hours) at 09/21/2022 1702 Last data filed at 09/21/2022 1615 Gross per 24 hour  Intake  1385.66 ml  Output 1500 ml  Net -114.34 ml   There were no vitals filed for this visit.  Exam: General: NAD Cardiovascular: S1, S2 present Respiratory: Diminished BS b/l Abdomen: Soft, nontender, nondistended, bowel sounds present Musculoskeletal: No bilateral pedal edema noted Skin: Very dry Psychiatry: Normal mood     Data Reviewed: CBC: Recent Labs  Lab 09/15/22 0200 09/19/22 1801 09/20/22 0428 09/21/22 0412  WBC 7.8 9.6 6.1 6.7  NEUTROABS  --  7.4  --   --   HGB 8.8* 10.4* 10.0* 9.0*  HCT 26.0* 30.7* 30.1* 27.0*  MCV 92.2 91.6 93.5 93.4  PLT 589* 633* 674* 269*   Basic Metabolic Panel: Recent Labs  Lab 09/15/22 0200 09/16/22 0525 09/20/22 0428 09/20/22 1153 09/20/22 1731 09/20/22 2353 09/21/22 0412  NA 130*   < > 127* 128* 127* 130* 132*  K 3.8   < > 4.5 3.9 3.8 3.3* 3.5  CL 96*   < > 91* 91* 92* 95* 96*  CO2 24   < > 27 28 25 26 28   GLUCOSE 97   < > 109* 96 99 114* 95  BUN 7*   < > 8 12 16 14 10   CREATININE 0.50*   < > 0.46* 0.59* 0.81 0.64 0.46*  CALCIUM 8.2*   < > 8.5* 8.6* 8.5* 8.6* 8.5*  MG 1.5*  --   --   --   --   --   --   PHOS 3.8  --   --   --   --   --   --    < > = values in this interval not displayed.   GFR: Estimated Creatinine Clearance: 74 mL/min (A) (by C-G formula based on SCr of 0.46 mg/dL (L)). Liver Function Tests: Recent Labs  Lab 09/19/22 1801  AST 17  ALT 18  ALKPHOS 71  BILITOT 0.7  PROT 6.8  ALBUMIN 3.1*   Recent Labs  Lab 09/19/22 1801  LIPASE 38   Recent Labs  Lab 09/19/22 2140  AMMONIA 43*   Coagulation Profile: No results for input(s): "INR", "PROTIME" in the last 168 hours. Cardiac Enzymes: No results for input(s): "CKTOTAL", "CKMB", "CKMBINDEX", "TROPONINI" in the last 168 hours. BNP (last 3 results) No results for input(s): "PROBNP" in the last 8760 hours. HbA1C: No results for input(s): "HGBA1C" in the last 72 hours. CBG: Recent Labs  Lab 09/14/22 1721 09/15/22 0739 09/15/22 1153  09/15/22 1556  GLUCAP 127* 96 106* 133*   Lipid Profile: No results for input(s): "CHOL", "HDL", "LDLCALC", "TRIG", "CHOLHDL", "LDLDIRECT" in the last 72 hours. Thyroid Function Tests: No results for input(s): "TSH", "T4TOTAL", "FREET4", "T3FREE", "THYROIDAB" in the last 72 hours. Anemia Panel: No results for input(s): "VITAMINB12", "FOLATE", "FERRITIN", "TIBC", "IRON", "RETICCTPCT" in the last 72 hours. Urine analysis:    Component Value Date/Time   COLORURINE STRAW (A) 09/20/2022 0229   APPEARANCEUR CLEAR 09/20/2022 0229   LABSPEC 1.003 (L) 09/20/2022 0229   PHURINE 7.0 09/20/2022 0229   GLUCOSEU NEGATIVE 09/20/2022 0229   HGBUR NEGATIVE 09/20/2022 0229   BILIRUBINUR NEGATIVE 09/20/2022 0229  KETONESUR NEGATIVE 09/20/2022 0229   PROTEINUR NEGATIVE 09/20/2022 0229   NITRITE NEGATIVE 09/20/2022 0229   LEUKOCYTESUR NEGATIVE 09/20/2022 0229   Sepsis Labs: @LABRCNTIP (procalcitonin:4,lacticidven:4)  ) Recent Results (from the past 240 hour(s))  Resp panel by RT-PCR (RSV, Flu A&B, Covid) Anterior Nasal Swab     Status: None   Collection Time: 09/19/22  5:40 PM   Specimen: Anterior Nasal Swab  Result Value Ref Range Status   SARS Coronavirus 2 by RT PCR NEGATIVE NEGATIVE Final    Comment: (NOTE) SARS-CoV-2 target nucleic acids are NOT DETECTED.  The SARS-CoV-2 RNA is generally detectable in upper respiratory specimens during the acute phase of infection. The lowest concentration of SARS-CoV-2 viral copies this assay can detect is 138 copies/mL. A negative result does not preclude SARS-Cov-2 infection and should not be used as the sole basis for treatment or other patient management decisions. A negative result may occur with  improper specimen collection/handling, submission of specimen other than nasopharyngeal swab, presence of viral mutation(s) within the areas targeted by this assay, and inadequate number of viral copies(<138 copies/mL). A negative result must be  combined with clinical observations, patient history, and epidemiological information. The expected result is Negative.  Fact Sheet for Patients:  11/18/22  Fact Sheet for Healthcare Providers:  BloggerCourse.com  This test is no t yet approved or cleared by the [employer]ious[employer].[place] FDA and  has been authorized for detection and/or diagnosis of SARS-CoV-2 by FDA under an Emergency Use Authorization (EUA). This EUA will remain  in effect (meaning this test can be used) for the duration of the COVID-19 declaration under Section 564(b)(1) of the Act, 21 U.S.C.section 360bbb-3(b)(1), unless the authorization is terminated  or revoked sooner.       Influenza A by PCR NEGATIVE NEGATIVE Final   Influenza B by PCR NEGATIVE NEGATIVE Final    Comment: (NOTE) The Xpert Xpress SARS-CoV-2/FLU/RSV plus assay is intended as an aid in the diagnosis of influenza from Nasopharyngeal swab specimens and should not be used as a sole basis for treatment. Nasal washings and aspirates are unacceptable for Xpert Xpress SARS-CoV-2/FLU/RSV testing.  Fact Sheet for Patients: [place]  Fact Sheet for Healthcare Providers: BloggerCourse.com  This test is not yet approved or cleared by the SeriousBroker.it FDA and has been authorized for detection and/or diagnosis of SARS-CoV-2 by FDA under an Emergency Use Authorization (EUA). This EUA will remain in effect (meaning this test can be used) for the duration of the COVID-19 declaration under Section 564(b)(1) of the Act, 21 U.S.C. section 360bbb-3(b)(1), unless the authorization is terminated or revoked.     Resp Syncytial Virus by PCR NEGATIVE NEGATIVE Final    Comment: (NOTE) Fact Sheet for Patients: [place]  Fact Sheet for Healthcare Providers: BloggerCourse.com  This test is not yet  approved or cleared by the Serious[employer]ker.it FDA and has been authorized for detection and/or diagnosis of SARS-CoV-2 by FDA under an Emergency Use Authorization (EUA). This EUA will remain in effect (meaning this test can be used) for the duration of the COVID-19 declaration under Section 564(b)(1) of the Act, 21 U.S.C. section 360bbb-3(b)(1), unless the authorization is terminated or revoked.  Performed at [facility], [address] [place], [place]       Studies: No results found.  Scheduled Meds:  amLODipine  10 mg Oral Daily   enoxaparin (LOVENOX) injection  40 mg Subcutaneous Daily   fluticasone furoate-vilanterol  1 puff Inhalation Daily   folic acid  1 mg Oral Daily   lisinopril  10 mg Oral Daily   LORazepam  0-4 mg Intravenous Q6H   Followed by   Melene Muller ON 09/22/2022] LORazepam  0-4 mg Intravenous Q12H   multivitamin with minerals  1 tablet Oral Daily   thiamine  100 mg Oral Daily   Or   thiamine  100 mg Intravenous Daily    Continuous Infusions:  sodium chloride 75 mL/hr at 09/21/22 1615     LOS: 1 day     Briant Cedar, MD [facility]  If 7PM-7AM, please contact night-coverage [email or web address] 09/21/2022, 5:02 PM

## 2022-09-21 NOTE — Progress Notes (Signed)
PT Cancellation Note  Patient Details Name: Ronald [name] MRN: [id number] DOB: 1956-07-19   Cancelled Treatment:    Reason Eval/Treat Not Completed: Patient declined, no reason specified    Doreatha Massed, PT Acute Rehabilitation  Office: [phone]

## 2022-09-21 NOTE — Progress Notes (Signed)
OT Cancellation Note  Patient Details Name: Ronald [name] MRN: [id number] DOB: 08-12-1956   Cancelled Treatment:    Reason Eval/Treat Not Completed: Patient declined, no reason specified   Leota Sauers, OTR/L 09/21/2022, 4:36 PM

## 2022-09-22 ENCOUNTER — Inpatient Hospital Stay (HOSPITAL_COMMUNITY): Payer: Medicaid Other

## 2022-09-22 DIAGNOSIS — G9340 Encephalopathy, unspecified: Secondary | ICD-10-CM | POA: Diagnosis not present

## 2022-09-22 DIAGNOSIS — F1010 Alcohol abuse, uncomplicated: Secondary | ICD-10-CM | POA: Diagnosis not present

## 2022-09-22 DIAGNOSIS — E871 Hypo-osmolality and hyponatremia: Secondary | ICD-10-CM | POA: Diagnosis not present

## 2022-09-22 DIAGNOSIS — J449 Chronic obstructive pulmonary disease, unspecified: Secondary | ICD-10-CM | POA: Diagnosis not present

## 2022-09-22 LAB — CBC
HCT: 28.5 % — ABNORMAL LOW (ref 39.0–52.0)
Hemoglobin: 9.3 g/dL — ABNORMAL LOW (ref 13.0–17.0)
MCH: 30.8 pg (ref 26.0–34.0)
MCHC: 32.6 g/dL (ref 30.0–36.0)
MCV: 94.4 fL (ref 80.0–100.0)
Platelets: 601 10*3/uL — ABNORMAL HIGH (ref 150–400)
RBC: 3.02 MIL/uL — ABNORMAL LOW (ref 4.22–5.81)
RDW: 14.4 % (ref 11.5–15.5)
WBC: 8.5 10*3/uL (ref 4.0–10.5)
nRBC: 0 % (ref 0.0–0.2)

## 2022-09-22 LAB — BASIC METABOLIC PANEL
Anion gap: 6 (ref 5–15)
BUN: 5 mg/dL — ABNORMAL LOW (ref 8–23)
CO2: 27 mmol/L (ref 22–32)
Calcium: 8.4 mg/dL — ABNORMAL LOW (ref 8.9–10.3)
Chloride: 99 mmol/L (ref 98–111)
Creatinine, Ser: 0.43 mg/dL — ABNORMAL LOW (ref 0.61–1.24)
GFR, Estimated: >60 mL/min (ref >60)
Glucose, Bld: 84 mg/dL (ref 70–99)
Potassium: 3.6 mmol/L (ref 3.5–5.1)
Sodium: 132 mmol/L — ABNORMAL LOW (ref 135–145)

## 2022-09-22 NOTE — Plan of Care (Signed)
  Problem: Activity: Goal: Risk for activity intolerance will decrease Outcome: Progressing   Problem: Nutrition: Goal: Adequate nutrition will be maintained Outcome: Progressing   Problem: Pain Managment: Goal: General experience of comfort will improve Outcome: Progressing   Problem: Safety: Goal: Ability to remain free from injury will improve Outcome: Progressing   

## 2022-09-22 NOTE — Progress Notes (Signed)
Modified Barium Swallow Progress Note  Patient Details  Name: [name] MRN: [id number] Date of Birth: 1956/02/17  Today's Date: 09/22/2022  Modified Barium Swallow completed.  Full report located under Chart Review in the Imaging Section.  Brief recommendations include the following:  Clinical Impression  Pt presents with functional oropharyngeal swallow ability when tested with ultra thin, thin, nectar, pudding, cracker and tablet.  He demonstrated piecemealing with all consistencies and premature spillage.  Spillage to pyriform sinus with cracker noted.  Oral residuals spilled into pharynx and were cleared with reflexive swallow - sometimes delayed as boluses aggregated at vallecular space. Pt tends to take large boluses of liquids but was protective of his airway.  Pt had difficulty orally transiting tablet with thin - and required pudding to finally transit into pharynx.  Upon esophageal sweep, tablet appeared to halt at LES WITHOUT pt awareness.  Further boluses of thin transited tablet into stomach.  Pt did  not cough or clear his throat during entire study. He did need encouragement to sit fully upright for testing - thus recommend continue said posture.   Given h/o esop dysmotility, will follow up x1 to assure tolerance and review dysmotility compensations.  Pt observed his MBS study live and was educated during testing.   Swallow Evaluation Recommendations       SLP Diet Recommendations: Thin liquid;Regular solids   Liquid Administration via: Cup;Straw   Medication Administration: Other (Comment) (as tolerated)   Supervision: Patient able to self feed   Compensations: Minimize environmental distractions;Slow rate;Small sips/bites   Postural Changes: Remain semi-upright after after feeds/meals (Comment)   Oral Care Recommendations: Oral care BID      Kathleen Lime, MS [facility] SLP Acute Rehab Services Office [phone] Pager [phone]   Macario Golds 09/22/2022,9:38 AM

## 2022-09-22 NOTE — Evaluation (Signed)
Physical Therapy Evaluation Patient Details Name: Ronald [name] MRN: [id number] DOB: May 31, 1956 Today's Date: 09/22/2022  History of Present Illness  Pt is a 66yo male presenting to [facility] ED on 09/19/22 with AMS and generalized weakness, found by family with open beer cans in room. Recent hospitalization 09/02/22-09/17/22 with dyspnea, increased WOB, wheezing, required intubation. Admitted with hyponatremia and acute encephalopathy. PMH: gout, polysubstance abuse, HTN, L transtibial amputation, COPD.   Clinical Impression  Pt presents with the problems listed above and functional impairments below. Upon entry pt eating multiple candy bars and coughing with chewing, provided additional water and assisted with repositioning in bed requiring mod assist. Pt presents with generalized weakness, reduced range of motion, and decreased activity tolerance. Pt with functional ROM of RLE except ankle which appears to be immobile, R foot in need of care as it has skin breakdown and long nails, pt reporting he performs foot care. Pt has functional ROM of LLE at hip and knee. Pt reporting he performs stand pivot transfer to WC/scooter at home but adamantly refuses to demonstrate to this therapist despite max encouragement and education on the necessity of mobility to improve function. Sister present in the room and attempted to provide additional encouragement, pt became increasingly insistent on remaining in bed. Pt completed RLE heel slides but declined any additional exercises. Pt would benefit from SNF-level therapies upon discharge and is agreeable; will need MAX HH services if unable to go to SNF. We will continue to follow acutely.         Recommendations for follow up therapy are one component of a multi-disciplinary discharge planning process, led by the attending physician.  Recommendations may be updated based on patient status, additional functional criteria and insurance authorization.  Follow Up  Recommendations Skilled nursing-short term rehab (<3 hours/day) (vs HH services. Pt would benefit from SNF-level therapies, if denied or refused then [place].) Can patient physically be transported by private vehicle: No    Assistance Recommended at Discharge Frequent or constant Supervision/Assistance  Patient can return home with the following  Assistance with cooking/housework;Direct supervision/assist for financial management;Direct supervision/assist for medications management;Assist for transportation;A little help with walking and/or transfers;A little help with bathing/dressing/bathroom    Equipment Recommendations None recommended by PT (Pt reports having WC at home from prior admission)  Recommendations for Other Services       Functional Status Assessment Patient has had a recent decline in their functional status and demonstrates the ability to make significant improvements in function in a reasonable and predictable amount of time.     Precautions / Restrictions Precautions Precautions: Fall Precaution Comments: 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) Restrictions Weight Bearing Restrictions: Yes RLE Weight Bearing: Weight bearing as tolerated Other Position/Activity Restrictions: in hard sole shoe per ortho (09/14/2022)      Mobility  Bed Mobility               General bed mobility comments: Pt declined bed mobility despite max encouragement    Transfers                   General transfer comment: Pt declined transfer adamantly citing "I just get up and transfer to my chair, man!"    Ambulation/Gait               General Gait Details: unable  Stairs            Wheelchair Mobility    Modified Rankin (Stroke Patients Only)  Balance                                             Pertinent Vitals/Pain Pain Assessment Pain Assessment: No/denies pain Pain Location: R leg Pain Descriptors /  Indicators: Discomfort, Grimacing, Guarding    Home Living Family/patient expects to be discharged to:: Private residence Living Arrangements: Alone Available Help at Discharge: Available 24 hours/day Type of Home: House Home Access: Ramped entrance       Home Layout: One level Home Equipment: BSC/3in1;Wheelchair - manual Additional Comments: reports he used to have a scooter but now doesn't know where it is. He received a wc at discharge .    Prior Function Prior Level of Function : Patient poor historian/Family not available             Mobility Comments: Reports he could transfer himself in and out of wc ADLs Comments: reports IND     Hand Dominance   Dominant Hand: Right    Extremity/Trunk Assessment   Upper Extremity Assessment Upper Extremity Assessment: Defer to OT evaluation    Lower Extremity Assessment Lower Extremity Assessment: RLE deficits/detail;LLE deficits/detail RLE Deficits / Details: reduced ankle mobility, pt states "I can't move my ankle"pt has functional ROM RLE Sensation: WNL LLE Deficits / Details: Healed L-BKA, pt has functional ROM LLE Sensation: WNL    Cervical / Trunk Assessment Cervical / Trunk Assessment: Normal  Communication   Communication: No difficulties  Cognition Arousal/Alertness: Awake/alert Behavior During Therapy: Flat affect Overall Cognitive Status: Difficult to assess Area of Impairment: Memory, Safety/judgement, Awareness, Problem solving                     Memory: Decreased short-term memory   Safety/Judgement: Decreased awareness of safety, Decreased awareness of deficits Awareness: Emergent Problem Solving: Requires verbal cues, Slow processing General Comments: Diffiuclt to assess cognition due to patient being somewhat non-participatory and limtied verbalizations. He was able to state it was [place] and 2024        General Comments      Exercises General Exercises - Lower Extremity Heel Slides:  AROM, Right, 10 reps, Supine   Assessment/Plan    PT Assessment Patient needs continued PT services  PT Problem List Decreased strength;Decreased range of motion;Decreased activity tolerance;Decreased balance;Decreased mobility;Decreased cognition;Decreased knowledge of use of DME;Decreased safety awareness;Decreased knowledge of precautions       PT Treatment Interventions DME instruction;Functional mobility training;Therapeutic activities;Therapeutic exercise;Balance training;Neuromuscular re-education;Cognitive remediation;Patient/family education;Stair training;Gait training;Wheelchair mobility training    PT Goals (Current goals can be found in the Care Plan section)  Acute Rehab PT Goals Patient Stated Goal: To go home PT Goal Formulation: With patient Time For Goal Achievement: 10/06/22 Potential to Achieve Goals: Fair    Frequency Min 3X/week     Co-evaluation               AM-PAC PT "6 Clicks" Mobility  Outcome Measure Help needed turning from your back to your side while in a flat bed without using bedrails?: None Help needed moving from lying on your back to sitting on the side of a flat bed without using bedrails?: A Little Help needed moving to and from a bed to a chair (including a wheelchair)?: A Little Help needed standing up from a chair using your arms (e.g., wheelchair or bedside chair)?: Total Help needed to walk in  hospital room?: Total Help needed climbing 3-5 steps with a railing? : Total 6 Click Score: 13    End of Session   Activity Tolerance: Patient tolerated treatment well Patient left: with call bell/phone within reach;in bed;with bed alarm set Nurse Communication: Mobility status PT Visit Diagnosis: Other abnormalities of gait and mobility (R26.89);Muscle weakness (generalized) (M62.81);History of falling (Z91.81)    Time: 6294-7654 PT Time Calculation (min) (ACUTE ONLY): 15 min   Charges:   PT Evaluation $PT Eval Low Complexity: 1  Low          Coolidge Breeze, PT, DPT [facility] Rehabilitation Department Office: [phone] Weekend pager: [phone]  Coolidge Breeze 09/22/2022, 4:31 PM

## 2022-09-22 NOTE — Evaluation (Signed)
Occupational Therapy Evaluation Patient Details Name: Ronald [name] MRN: [id number] DOB: 1956-08-20 Today's Date: 09/22/2022   History of Present Illness Ronald [name] is a 67 y.o. male with medical history significant of COPD, EtOH and cocaine abuse, L BKA. Pt is frequently hyponatremic as he drinks only Beer, no other EtOH.  Pt with recent admission 12/21 for acute hypercapnic resp failure and EtOH withdrawal seizure.  Required intubation.  Able to come off of vent on 12/25.  Then spent another 11 days in hospital while medical team, family, all tried to convince him to go to ALF/SNF, patient refused and went home (lives with some roommates)Pysch saw him, decided he had decision making capacity.  APS also involved. During this admission, pt initially denied drinking since discharge, family disagrees noting open beer cans in room when they found him with AMS / generalized weakness. In the ED, pt was found to hyponatremia and admitted for acute encephalopathy and hyponatremia.   Clinical Impression   Mr. [place] Escue is a 67 year old man who presents with above medical history. On evaluation patient presents with generalized weakness and decreased activity tolerance. He demonstrated good upper body strength, ability to perform bed mobility, and sit edge of bed to don sock. However, activity limited to just those two tasks. He is quite terse and does not elaborate on any details. He did suggest he needed to go to rehab. When therapist asked if he "struggled at home" he just nodded. Patient did not exhibit the functional skills needed in order to return home at this time. Therapist unsure of who he really has to assist him at home. He reports he used to have a scooter but is unsure of where it is now. He was provided with a wheelchair on previous discharge and stated he was able to transfer himself. Patient will benefit from skilled OT services while in hospital to improve deficits and learn  compensatory strategies as needed in order to return to PLOF.        Recommendations for follow up therapy are one component of a multi-disciplinary discharge planning process, led by the attending physician.  Recommendations may be updated based on patient status, additional functional criteria and insurance authorization.   Follow Up Recommendations  Skilled nursing-short term rehab (<3 hours/day)     Assistance Recommended at Discharge Frequent or constant Supervision/Assistance  Patient can return home with the following A little help with walking and/or transfers;A little help with bathing/dressing/bathroom;Assistance with cooking/housework;Direct supervision/assist for financial management;Direct supervision/assist for medications management;Help with stairs or ramp for entrance    Functional Status Assessment  Patient has had a recent decline in their functional status and demonstrates the ability to make significant improvements in function in a reasonable and predictable amount of time.  Equipment Recommendations  None recommended by OT    Recommendations for Other Services       Precautions / Restrictions Precautions Precautions: Fall Precaution Comments: 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) Restrictions Weight Bearing Restrictions: Yes RLE Weight Bearing: Weight bearing as tolerated Other Position/Activity Restrictions: in hard sole shoe per ortho (09/14/2022)      Mobility Bed Mobility Overal bed mobility: Needs Assistance Bed Mobility: Supine to Sit, Sit to Supine     Supine to sit: Supervision Sit to supine: Supervision        Transfers                          Balance  Overall balance assessment: Needs assistance Sitting-balance support: No upper extremity supported, Feet supported Sitting balance-Leahy Scale: Good                                     ADL either performed or assessed with clinical judgement   ADL Overall  ADL's : Needs assistance/impaired Eating/Feeding: Independent   Grooming: Set up;Sitting   Upper Body Bathing: Set up;Sitting   Lower Body Bathing: Sitting/lateral leans;Minimal assistance   Upper Body Dressing : Set up;Sitting   Lower Body Dressing: Sitting/lateral leans;Minimal assistance Lower Body Dressing Details (indicate cue type and reason): Patient able to don sock seated at edge of bed   Toilet Transfer Details (indicate cue type and reason): refused transfer [place] and Hygiene: Maximal assistance;Bed level               Vision   Vision Assessment?: No apparent visual deficits     Perception     Praxis      Pertinent Vitals/Pain Pain Assessment Pain Assessment: No/denies pain     Hand Dominance Right   Extremity/Trunk Assessment Upper Extremity Assessment Upper Extremity Assessment: Overall WFL for tasks assessed   Lower Extremity Assessment Lower Extremity Assessment: Defer to PT evaluation   Cervical / Trunk Assessment Cervical / Trunk Assessment: Normal   Communication Communication Communication: No difficulties   Cognition Arousal/Alertness: Awake/alert Behavior During Therapy: WFL for tasks assessed/performed Overall Cognitive Status: Difficult to assess                                 General Comments: Diffiuclt to assess cognition due to patient being somewhat non-participatory and limtied verbalizations. He was able to state it was [place] and 2024     General Comments       Exercises     Shoulder Instructions      Home Living Family/patient expects to be discharged to:: Private residence Living Arrangements: Other relatives Available Help at Discharge: Available 24 hours/day Type of Home: Apartment Home Access: Level entry     Home Layout: One level     Bathroom Shower/Tub: Teacher, early years/pre: Standard     Home Equipment: Gaffer -  manual   Additional Comments: reports he used to have a scooter but now doesn't know where it is. He received a wc at discharge .      Prior Functioning/Environment Prior Level of Function : Patient poor historian/Family not available             Mobility Comments: Reports he could transfer himself in and out of wc ADLs Comments: Does not report on abilities -- did state he thinks he needs rehab because he was struggling when he was [facility] from [facility]        OT Problem List: Decreased strength;Decreased range of motion;Impaired balance (sitting and/or standing);Decreased activity tolerance;Decreased cognition;Decreased safety awareness;Decreased knowledge of use of DME or AE      OT Treatment/Interventions: Self-care/ADL training;Therapeutic exercise;DME and/or AE instruction;Energy conservation;Therapeutic activities;Patient/family education;Balance training    OT Goals(Current goals can be found in the care plan section) Acute Rehab OT Goals Patient Stated Goal: none stated OT Goal Formulation: With patient Time For Goal Achievement: 10/06/22 Potential to Achieve Goals: Good  OT Frequency: Min 2X/week    Co-evaluation  AM-PAC OT "6 Clicks" Daily Activity     Outcome Measure Help from another person eating meals?: None Help from another person taking care of personal grooming?: A Little Help from another person toileting, which includes using toliet, bedpan, or urinal?: A Lot Help from another person bathing (including washing, rinsing, drying)?: A Little Help from another person to put on and taking off regular upper body clothing?: A Little Help from another person to put on and taking off regular lower body clothing?: A Little 6 Click Score: 18   End of Session Nurse Communication: Mobility status  Activity Tolerance: Patient tolerated treatment well Patient left: in bed;with call bell/phone within reach;with bed alarm set;with nursing/sitter in  room  OT Visit Diagnosis: Unsteadiness on feet (R26.81);Other abnormalities of gait and mobility (R26.89);Muscle weakness (generalized) (M62.81)                Time: 7829-5621 OT Time Calculation (min): 17 min Charges:  OT General Charges $OT Visit: 1 Visit  Gustavo Lah, OTR/L [place]  Office [phone]   Lenward Chancellor 09/22/2022, 2:44 PM

## 2022-09-22 NOTE — Progress Notes (Signed)
[place], is a 67 y.o. male, DOB - 1956/03/13, ZOX:[id number] Admit date - 09/19/2022    Outpatient Primary MD for the patient is Ronald Isaacs, FNP  LOS - 2  days    Brief summary   [name] is a 67 y.o. male with medical history significant of COPD, EtOH and cocaine abuse, L BKA. Pt is frequently hyponatremic as he drinks only Beer, no other EtOH. Pt with recent admission 12/21 for acute hypercapnic resp failure and EtOH withdrawal seizure.  Required intubation.  Able to come off of vent on 12/25.  Then spent another 11 days in hospital while medical team, family, all tried to convince him to go to ALF/SNF, patient refused and went home (lives with some roommates)Pysch saw him, decided he had decision making capacity.  APS also involved. During this admission, pt initially denied drinking since discharge, family disagrees noting open beer cans in room when they found him with AMS / generalized weakness. In the ED, pt was found to hyponatremia and admitted for further management.    Assessment & Plan    Assessment and Plan:    Acute metabolic encephalopathy: - much improved. Probably from hyponatremia vs substance abuse.  - he is alert and answering questions.  -Flu, RSV, COVID are negative UDS is positive for cocaine, barbiturates. CT head is negative.    Hyponatremia  probably secondary to alcohol use improving. Sodium is stable at 132  COPD (chronic obstructive pulmonary disease) (HCC) No wheezing heard.  Continue with bronchodilators.   ETOH abuse No withdrawals today.    Chronic hypercapnic respiratory failure (HCC) secondary to   HTN (hypertension), benign Blood pressure parameters are optimal continue with amlodipine and lisinopril.    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 Advance diet as  tolerated.  Therapy evaluations recommending SNF      RN Pressure Injury Documentation: Pressure Injury 09/05/22 Elbow Left;Posterior Stage 2 -  Partial thickness loss of dermis presenting as a shallow open injury with a red, pink wound bed without slough. (Active)  09/05/22 0800  Location: Elbow  Location Orientation: Left;Posterior  Staging: Stage 2 -  Partial thickness loss of dermis presenting as a shallow open injury with a red, pink wound bed without slough.  Wound Description (Comments):   Present on Admission:   Dressing Type Foam - Lift dressing to assess site every shift 09/21/22 2000     Pressure Injury Ischial tuberosity Left;Posterior;Proximal Stage 2 -  Partial thickness loss of dermis presenting as a shallow open injury with a red, pink wound bed without slough. (Active)     Location: Ischial tuberosity  Location Orientation: Left;Posterior;Proximal  Staging: Stage 2 -  Partial thickness loss of dermis presenting as a shallow open injury with a red, pink wound bed without slough.  Wound Description (Comments):   Present on Admission:   Dressing Type Foam - Lift dressing to assess site every shift 09/20/22 0845     Estimated body mass index is 21.49  kg/m as calculated from the following:   Height as of 09/02/22: 5\' 4"  (1.626 m).   Weight as of 09/17/22: 56.8 kg.  Code Status: full code.  DVT Prophylaxis:  enoxaparin (LOVENOX) injection 40 mg Start: 09/20/22 1000   Level of Care: Level of care: Progressive Family Communication: none at bedside.   Disposition Plan:     Remains inpatient appropriate:  SNF when stable  Procedures:  None.   Consultants:   None.   Antimicrobials:   Anti-infectives (From admission, onward)    None        Medications  Scheduled Meds:  amLODipine  10 mg Oral Daily   enoxaparin (LOVENOX) injection  40 mg Subcutaneous Daily   fluticasone furoate-vilanterol  1 puff Inhalation Daily   folic acid  1 mg Oral Daily    lisinopril  10 mg Oral Daily   LORazepam  0-4 mg Intravenous Q12H   multivitamin with minerals  1 tablet Oral Daily   pantoprazole  40 mg Oral Daily   thiamine  100 mg Oral Daily   Or   thiamine  100 mg Intravenous Daily   Continuous Infusions: PRN Meds:.acetaminophen **OR** acetaminophen, albuterol, LORazepam **OR** LORazepam, ondansetron **OR** ondansetron (ZOFRAN) IV    Subjective:   11/19/22 was seen and examined today.  No new complaints.   Objective:   Vitals:   09/22/22 0239 09/22/22 0700 09/22/22 0859 09/22/22 1233  BP: 118/71 (!) 155/110  110/72  Pulse: 92 90  88  Resp: 17 20  17   Temp: 98.5 F (36.9 C)   97.7 F (36.5 C)  TempSrc: Oral   Oral  SpO2: 98% 95% 96% 99%    Intake/Output Summary (Last 24 hours) at 09/22/2022 1842 Last data filed at 09/22/2022 1500 Gross per 24 hour  Intake 960 ml  Output 3225 ml  Net -2265 ml   There were no vitals filed for this visit.   Exam General exam: Appears calm and comfortable  Respiratory system: Clear to auscultation. Respiratory effort normal. Cardiovascular system: S1 & S2 heard, RRR. No JVD, murmurs,  Gastrointestinal system: Abdomen is nondistended, soft and nontender.  Central nervous system: Alert and oriented. No focal neurological deficits. Extremities: Symmetric 5 x 5 power. Skin: No rashes, lesions or ulcers Psychiatry: . Mood & affect appropriate.     Data Reviewed:  I have personally reviewed following labs and imaging studies   CBC Lab Results  Component Value Date   WBC 8.5 09/22/2022   RBC 3.02 (L) 09/22/2022   HGB 9.3 (L) 09/22/2022   HCT 28.5 (L) 09/22/2022   MCV 94.4 09/22/2022   MCH 30.8 09/22/2022   PLT 601 (H) 09/22/2022   MCHC 32.6 09/22/2022   RDW 14.4 09/22/2022   LYMPHSABS 1.2 09/19/2022   MONOABS 0.5 09/19/2022   EOSABS 0.4 09/19/2022   BASOSABS 0.0 09/19/2022     Last metabolic panel Lab Results  Component Value Date   NA 132 (L) 09/22/2022   K 3.6 09/22/2022    CL 99 09/22/2022   CO2 27 09/22/2022   BUN 5 (L) 09/22/2022   CREATININE 0.43 (L) 09/22/2022   GLUCOSE 84 09/22/2022   GFRNONAA >60 09/22/2022   GFRAA >60 03/24/2019   CALCIUM 8.4 (L) 09/22/2022   PHOS 3.8 09/15/2022   PROT 6.8 09/19/2022   ALBUMIN 3.1 (L) 09/19/2022   BILITOT 0.7 09/19/2022   ALKPHOS 71 09/19/2022   AST 17 09/19/2022   ALT 18 09/19/2022   ANIONGAP 6 09/22/2022  CBG (last 3)  No results for input(s): "GLUCAP" in the last 72 hours.    Coagulation Profile: No results for input(s): "INR", "PROTIME" in the last 168 hours.   Radiology Studies: DG Swallowing Func-Speech Pathology  Result Date: 09/22/2022 Table formatting from the original result was not included. Objective Swallowing Evaluation: Type of Study: MBS-Modified Barium Swallow Study  Patient Details Name: [name] MRN: [id number] Date of Birth: [birth year] Today's Date: 09/22/2022 Time: SLP Start Time (ACUTE ONLY): 6808 -SLP Stop Time (ACUTE ONLY): 0849 SLP Time Calculation (min) (ACUTE ONLY): 14 min Past Medical History: Past Medical History: Diagnosis Date  Critical lower limb ischemia ([place])   ETOH abuse   Gout   Hypertension  Past Surgical History: Past Surgical History: Procedure Laterality Date  ABDOMINAL AORTAGRAM N/A 12/26/2013  Procedure: ABDOMINAL Maxcine Ham;  Surgeon: Serafina Mitchell, MD;  Location: [facility] CATH LAB;  Service: Cardiovascular;  Laterality: N/A;  AMPUTATION Left 12/28/2013  Procedure: LEFT BELOW KNEE AMPUTATION;  Surgeon: Newt Minion, MD;  Location: [place];  Service: Orthopedics;  Laterality: Left;  BELOW KNEE LEG AMPUTATION    Left  LEG SURGERY   HPI: Patient is a 67 y.o. male with PMH: COPD, daily smoker, alcohol abuse, cocaine abuse left BKA who was recently admitted at [facility] 09/02/22-09/17/22 with dyspnea, increased WOB and wheezing, requiring intubation. He presented to [facility] ED on 09/19/22 after being found with AMS and generalized weakness by family. Patient denied ETOH use but family doubted  this as they found open beer cans in his room. In ED patient was lethargic initially but this improved; SpO2 100% on 2L via [place], lungs with rales left lower lung field but no accessory muscle usage and not tachypneaic. He was admitted with acute encephalopathy. On 09/20/22, RN requesting SLP swallow evaluation due to patient coughing excessively, mainly during PO intake. Per evaluating SLP, concern for multifactorial dysphagia including pharyngeal and esophageal dysphagia.  Esophagram 05/2022 showed dysmotility and testing was limited due to pt's positioning.  Subjective: awake in chair  Recommendations for follow up therapy are one component of a multi-disciplinary discharge planning process, led by the attending physician.  Recommendations may be updated based on patient status, additional functional criteria and insurance authorization. Assessment / Plan / Recommendation   09/22/2022   9:28 AM Clinical Impressions Clinical Impression Pt presents with functional oropharyngeal swallow ability when tested with ultra thin, thin, nectar, pudding, cracker and tablet.  He demonstrated piecemealing with all consistencies and premature spillage.  Spillage to pyriform sinus with cracker noted.  Oral residuals spilled into pharynx and were cleared with reflexive swallow - sometimes delayed as boluses aggregated at vallecular space. Pt tends to take large boluses of liquids but was protective of his airway.  Pt had difficulty orally transiting tablet with thin - and required pudding to finally transit into pharynx.  Upon esophageal sweep, tablet appeared to halt at LES WITHOUT pt awareness.  Further boluses of thin transited tablet into stomach.  Pt did  not cough or clear his throat during entire study. He did need encouragement to sit fully upright for testing - thus recommend continue said posture.   Given h/o esop dysmotility, will follow up x1 to assure tolerance and review dysmotility compensations.  Pt observed his MBS  study live and was educated during testing. SLP Visit Diagnosis Dysphagia, unspecified (R13.10) Impact on safety and function Mild aspiration risk     09/22/2022   9:28 AM Treatment Recommendations Treatment Recommendations Therapy as outlined in treatment  plan below     09/22/2022   9:36 AM Prognosis Prognosis for Safe Diet Advancement Good Barriers to Reach Goals Time post onset   09/22/2022   9:28 AM Diet Recommendations SLP Diet Recommendations Thin liquid;Regular solids Liquid Administration via Cup;Straw Medication Administration Other (Comment) Compensations Minimize environmental distractions;Slow rate;Small sips/bites Postural Changes Remain semi-upright after after feeds/meals (Comment)     09/22/2022   9:28 AM Other Recommendations Oral Care Recommendations Oral care BID Follow Up Recommendations Other (comment) Functional Status Assessment Patient has had a recent decline in their functional status and demonstrates the ability to make significant improvements in function in a reasonable and predictable amount of time.   09/22/2022   9:28 AM Frequency and Duration  Speech Therapy Frequency (ACUTE ONLY) min 2x/week Treatment Duration 2 weeks     09/22/2022   9:28 AM Oral Phase Oral Phase [facility]    09/22/2022   9:28 AM Pharyngeal Phase Pharyngeal Phase [facility]    09/22/2022   9:28 AM Cervical Esophageal Phase  Cervical Esophageal Phase WFL Rolena Infante, MS [facility] SLP Acute Rehab Services Office [phone] Pager [phone] Chales Abrahams 09/22/2022, 9:38 AM                         Kathlen Mody M.D. [facility] 09/22/2022, 6:42 PM  Available via Epic secure chat 7am-7pm After 7 pm, please refer to night coverage provider listed on amion.

## 2022-09-23 ENCOUNTER — Encounter (HOSPITAL_COMMUNITY): Payer: Self-pay | Admitting: Internal Medicine

## 2022-09-23 DIAGNOSIS — J449 Chronic obstructive pulmonary disease, unspecified: Secondary | ICD-10-CM | POA: Diagnosis not present

## 2022-09-23 DIAGNOSIS — F1010 Alcohol abuse, uncomplicated: Secondary | ICD-10-CM | POA: Diagnosis not present

## 2022-09-23 DIAGNOSIS — G9340 Encephalopathy, unspecified: Secondary | ICD-10-CM | POA: Diagnosis not present

## 2022-09-23 DIAGNOSIS — E871 Hypo-osmolality and hyponatremia: Secondary | ICD-10-CM | POA: Diagnosis not present

## 2022-09-23 MED ORDER — GUAIFENESIN-DM 100-10 MG/5ML PO SYRP
10.0000 mL | ORAL_SOLUTION | ORAL | Status: DC | PRN
  Administered 2022-09-23 – 2022-09-29 (×8): 10 mL via ORAL
  Filled 2022-09-23 (×8): qty 10

## 2022-09-23 MED ORDER — IBUPROFEN 200 MG PO TABS
200.0000 mg | ORAL_TABLET | Freq: Four times a day (QID) | ORAL | Status: DC | PRN
  Administered 2022-09-23 – 2022-09-30 (×9): 200 mg via ORAL
  Filled 2022-09-23 (×9): qty 1

## 2022-09-23 NOTE — Progress Notes (Signed)
Ronald [name], is a 67 y.o. male, DOB - 21-Jul-1956, WIO:[id number] Admit date - 09/19/2022    Outpatient Primary MD for the patient is Demetrios Isaacs, FNP  LOS - 3  days    Brief summary   Ronald [name] is a 67 y.o. male with medical history significant of COPD, EtOH and cocaine abuse, L BKA. Pt is frequently hyponatremic as he drinks only Beer, no other EtOH. Pt with recent admission 12/21 for acute hypercapnic resp failure and EtOH withdrawal seizure.  Required intubation.  Able to come off of vent on 12/25.  Then spent another 11 days in hospital while medical team, family, all tried to convince him to go to ALF/SNF, patient refused and went home (lives with some roommates)Pysch saw him, decided he had decision making capacity.  APS also involved. During this admission, pt initially denied drinking since discharge, family disagrees noting open beer cans in room when they found him with AMS / generalized weakness. In the ED, pt was found to hyponatremia and admitted for further management.    Assessment & Plan    Assessment and Plan:    Acute metabolic encephalopathy: - much improved. Probably from hyponatremia vs substance abuse.  - he is alert and answering questions. Not agitated.  -Flu, RSV, COVID are negative UDS is positive for cocaine, barbiturates. CT head is negative.    Hyponatremia  probably secondary to alcohol use improving. Sodium is stable at 132  COPD (chronic obstructive pulmonary disease) (HCC) No wheezing heard on exam.  Continue with bronchodilators.   ETOH abuse No signs of withdrawal.    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  Stable and is on RA.   HTN (hypertension), benign Blood pressure parameters ARE OPTIMAL   continue with amlodipine and lisinopril.   Anemia of chronic disease.  Hemoglobin around 9.  Continue to monitor.     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 Advance diet as tolerated.  Therapy evaluations recommending SNF   Patient is medically stable for discharge. But he says he cannot go home and he does not have any help at home. He wants to go to SNF. Suspect if we discharge him home, he will be readmitted back.    RN Pressure Injury Documentation: Pressure Injury 09/05/22 Elbow Left;Posterior Stage 2 -  Partial thickness loss of dermis presenting as a shallow open injury with a red, pink wound bed without slough. (Active)  09/05/22 0800  Location: Elbow  Location Orientation: Left;Posterior  Staging: Stage 2 -  Partial thickness loss of dermis presenting as a shallow open injury with a red, pink wound bed without slough.  Wound Description (Comments):   Present on Admission:   Dressing Type Foam - Lift dressing to assess site every shift 09/22/22 0958     Pressure Injury Ischial tuberosity Left;Posterior;Proximal Stage 2 -  Partial thickness loss of dermis presenting as a shallow open injury with a red, pink wound bed without slough. (Active)  Location: Ischial tuberosity  Location Orientation: Left;Posterior;Proximal  Staging: Stage 2 -  Partial thickness loss of dermis presenting as a shallow open injury with a red, pink wound bed without slough.  Wound Description (Comments):   Present on Admission:   Dressing Type Foam - Lift dressing to assess site every shift 09/22/22 0958     Estimated body mass index is 21.49 kg/m as calculated from the following:   Height as of 09/02/22: 5\' 4"  (1.626 m).   Weight as of 09/17/22: 56.8 kg.  Code Status: full code.  DVT Prophylaxis:  enoxaparin (LOVENOX) injection 40 mg Start: 09/20/22 1000   Level of Care: Level of care: Progressive Family Communication: none at bedside.   Disposition Plan:     Remains inpatient appropriate:  SNF when stable  Procedures:  None.    Consultants:   None.   Antimicrobials:   Anti-infectives (From admission, onward)    None        Medications  Scheduled Meds:  amLODipine  10 mg Oral Daily   enoxaparin (LOVENOX) injection  40 mg Subcutaneous Daily   fluticasone furoate-vilanterol  1 puff Inhalation Daily   folic acid  1 mg Oral Daily   lisinopril  10 mg Oral Daily   LORazepam  0-4 mg Intravenous Q12H   multivitamin with minerals  1 tablet Oral Daily   pantoprazole  40 mg Oral Daily   thiamine  100 mg Oral Daily   Or   thiamine  100 mg Intravenous Daily   Continuous Infusions: PRN Meds:.acetaminophen **OR** acetaminophen, albuterol, guaiFENesin-dextromethorphan, ibuprofen, ondansetron **OR** ondansetron (ZOFRAN) IV    Subjective:   Ronald [name] was seen and examined today.  Coughing.   Objective:   Vitals:   09/22/22 2105 09/23/22 0424 09/23/22 0826 09/23/22 1318  BP: 125/88 (!) 145/95  (!) 155/86  Pulse: (!) 108 96  (!) 109  Resp: 20 20 (!) 25 18  Temp: 97.8 F (36.6 C) 98 F (36.7 C)  98 F (36.7 C)  TempSrc: Oral Oral    SpO2: 97% 100% 100% 100%    Intake/Output Summary (Last 24 hours) at 09/23/2022 1432 Last data filed at 09/23/2022 1100 Gross per 24 hour  Intake 600 ml  Output 1300 ml  Net -700 ml    There were no vitals filed for this visit.   Exam General exam: Appears calm and comfortable  Respiratory system: Clear to auscultation. Respiratory effort normal. Cardiovascular system: S1 & S2 heard, RRR. No JVD,  No pedal edema. Gastrointestinal system: Abdomen is nondistended, soft and nontender.  Central nervous system: Alert and oriented. No focal neurological deficits. Extremities: Symmetric 5 x 5 power. Skin: No rashes, lesions or ulcers Psychiatry:  Mood & affect appropriate.     Data Reviewed:  I have personally reviewed following labs and imaging studies   CBC Lab Results  Component Value Date   WBC 8.5 09/22/2022   RBC 3.02 (L) 09/22/2022   HGB 9.3  (L) 09/22/2022   HCT 28.5 (L) 09/22/2022   MCV 94.4 09/22/2022   MCH 30.8 09/22/2022   PLT 601 (H) 09/22/2022   MCHC 32.6 09/22/2022   RDW 14.4 09/22/2022   LYMPHSABS 1.2 09/19/2022   MONOABS 0.5 09/19/2022   EOSABS 0.4 09/19/2022   BASOSABS 0.0 42/35/3614     Last metabolic panel Lab Results  Component Value Date   NA 132 (L) 09/22/2022   K 3.6 09/22/2022   CL 99 09/22/2022   CO2 27 09/22/2022   BUN  5 (L) 09/22/2022   CREATININE 0.43 (L) 09/22/2022   GLUCOSE 84 09/22/2022   GFRNONAA >60 09/22/2022   GFRAA >60 03/24/2019   CALCIUM 8.4 (L) 09/22/2022   PHOS 3.8 09/15/2022   PROT 6.8 09/19/2022   ALBUMIN 3.1 (L) 09/19/2022   BILITOT 0.7 09/19/2022   ALKPHOS 71 09/19/2022   AST 17 09/19/2022   ALT 18 09/19/2022   ANIONGAP 6 09/22/2022    CBG (last 3)  No results for input(s): "GLUCAP" in the last 72 hours.    Coagulation Profile: No results for input(s): "INR", "PROTIME" in the last 168 hours.   Radiology Studies: DG Swallowing Func-Speech Pathology  Result Date: 09/22/2022 Table formatting from the original result was not included. Objective Swallowing Evaluation: Type of Study: MBS-Modified Barium Swallow Study  Patient Details Name: Ronald [name] MRN: [id number] Date of Birth: 08/01/1956 Today's Date: 09/22/2022 Time: SLP Start Time (ACUTE ONLY): 7169 -SLP Stop Time (ACUTE ONLY): 0849 SLP Time Calculation (min) (ACUTE ONLY): 14 min Past Medical History: Past Medical History: Diagnosis Date  Critical lower limb ischemia (HCC)   ETOH abuse   Gout   Hypertension  Past Surgical History: Past Surgical History: Procedure Laterality Date  ABDOMINAL AORTAGRAM N/A 12/26/2013  Procedure: ABDOMINAL Ronny Flurry;  Surgeon: [name], MD;  Location: [facility] CATH LAB;  Service: Cardiovascular;  Laterality: N/A;  AMPUTATION Left 12/28/2013  Procedure: LEFT BELOW KNEE AMPUTATION;  Surgeon: Nadara Mustard, MD;  Location: [facility] OR;  Service: Orthopedics;  Laterality: Left;  BELOW KNEE LEG  AMPUTATION    Left  LEG SURGERY   HPI: Patient is a 67 y.o. male with PMH: COPD, daily smoker, alcohol abuse, cocaine abuse left BKA who was recently admitted at [facility] 09/02/22-09/17/22 with dyspnea, increased WOB and wheezing, requiring intubation. He presented to [facility] ED on 09/19/22 after being found with AMS and generalized weakness by family. Patient denied ETOH use but family doubted this as they found open beer cans in his room. In ED patient was lethargic initially but this improved; SpO2 100% on 2L via [place], lungs with rales left lower lung field but no accessory muscle usage and not tachypneaic. He was admitted with acute encephalopathy. On 09/20/22, RN requesting SLP swallow evaluation due to patient coughing excessively, mainly during PO intake. Per evaluating SLP, concern for multifactorial dysphagia including pharyngeal and esophageal dysphagia.  Esophagram 05/2022 showed dysmotility and testing was limited due to pt's positioning.  Subjective: awake in chair  Recommendations for follow up therapy are one component of a multi-disciplinary discharge planning process, led by the attending physician.  Recommendations may be updated based on patient status, additional functional criteria and insurance authorization. Assessment / Plan / Recommendation   09/22/2022   9:28 AM Clinical Impressions Clinical Impression Pt presents with functional oropharyngeal swallow ability when tested with ultra thin, thin, nectar, pudding, cracker and tablet.  He demonstrated piecemealing with all consistencies and premature spillage.  Spillage to pyriform sinus with cracker noted.  Oral residuals spilled into pharynx and were cleared with reflexive swallow - sometimes delayed as boluses aggregated at vallecular space. Pt tends to take large boluses of liquids but was protective of his airway.  Pt had difficulty orally transiting tablet with thin - and required pudding to finally transit into pharynx.  Upon esophageal sweep, tablet  appeared to halt at LES WITHOUT pt awareness.  Further boluses of thin transited tablet into stomach.  Pt did  not cough or clear his throat during entire study. He did need  encouragement to sit fully upright for testing - thus recommend continue said posture.   Given h/o esop dysmotility, will follow up x1 to assure tolerance and review dysmotility compensations.  Pt observed his MBS study live and was educated during testing. SLP Visit Diagnosis Dysphagia, unspecified (R13.10) Impact on safety and function Mild aspiration risk     09/22/2022   9:28 AM Treatment Recommendations Treatment Recommendations Therapy as outlined in treatment plan below     09/22/2022   9:36 AM Prognosis Prognosis for Safe Diet Advancement Good Barriers to Reach Goals Time post onset   09/22/2022   9:28 AM Diet Recommendations SLP Diet Recommendations Thin liquid;Regular solids Liquid Administration via Cup;Straw Medication Administration Other (Comment) Compensations Minimize environmental distractions;Slow rate;Small sips/bites Postural Changes Remain semi-upright after after feeds/meals (Comment)     09/22/2022   9:28 AM Other Recommendations Oral Care Recommendations Oral care BID Follow Up Recommendations Other (comment) Functional Status Assessment Patient has had a recent decline in their functional status and demonstrates the ability to make significant improvements in function in a reasonable and predictable amount of time.   09/22/2022   9:28 AM Frequency and Duration  Speech Therapy Frequency (ACUTE ONLY) min 2x/week Treatment Duration 2 weeks     09/22/2022   9:28 AM Oral Phase Oral Phase [facility]    09/22/2022   9:28 AM Pharyngeal Phase Pharyngeal Phase [facility]    09/22/2022   9:28 AM Cervical Esophageal Phase  Cervical Esophageal Phase WFL Rolena Infante, MS [facility] SLP Acute Rehab Services Office [phone] Pager [phone] Chales Abrahams 09/22/2022, 9:38 AM                         Kathlen Mody M.D. [facility] 09/23/2022, 2:32  PM  Available via Epic secure chat 7am-7pm After 7 pm, please refer to night coverage provider listed on amion.

## 2022-09-23 NOTE — Progress Notes (Signed)
Speech Language Pathology Treatment: Dysphagia;Passy Muir Speaking valve  Patient Details Name: Ronald [name] MRN: [id number] DOB: [birth year] Today's Date: 09/23/2022 Time: 1220-1230 SLP Time Calculation (min) (ACUTE ONLY): 10 min  Assessment / Plan / Recommendation Clinical Impression  Pt seen today to provide compensation strategies for his known esophageal dysmotilty -  Pt observed with intake of liquids today - no indication of aspiration - but he needed encouragement to sit upright.   Using teach back with written instructions all education completed with pt.  Today his voice is clear, intake listed as 100% - No follow up indicated.    HPI HPI: Patient is a 67 y.o. male with PMH: COPD, daily smoker, alcohol abuse, cocaine abuse left BKA who was recently admitted at [facility] 09/02/22-09/17/22 with dyspnea, increased WOB and wheezing, requiring intubation. He presented to [facility] ED on 09/19/22 after being found with AMS and generalized weakness by family. Patient denied ETOH use but family doubted this as they found open beer cans in his room. In ED patient was lethargic initially but this improved; SpO2 100% on 2L via [place], lungs with rales left lower lung field but no accessory muscle usage and not tachypneaic. He was admitted with acute encephalopathy. On 09/20/22, RN requesting SLP swallow evaluation due to patient coughing excessively, mainly during PO intake. Per evaluating SLP, concern for multifactorial dysphagia including pharyngeal and esophageal dysphagia.  Esophagram 05/2022 showed dysmotility and testing was limited due to pt's positioning.      SLP Plan  Discharge SLP treatment due to (comment)      Recommendations for follow up therapy are one component of a multi-disciplinary discharge planning process, led by the attending physician.  Recommendations may be updated based on patient status, additional functional criteria and insurance authorization.    Recommendations  Diet recommendations:  Regular;Thin liquid Liquids provided via: Cup;Straw Medication Administration: Other (Comment) (as tolerated) Supervision: Patient able to self feed;Intermittent supervision to cue for compensatory strategies Compensations: Minimize environmental distractions;Slow rate;Small sips/bites Postural Changes and/or Swallow Maneuvers: Seated upright 90 degrees;Upright 30-60 min after meal                Oral Care Recommendations: Staff/trained caregiver to provide oral care;Oral care BID Follow Up Recommendations: Other (comment) Assistance recommended at discharge: Frequent or constant Supervision/Assistance SLP Visit Diagnosis: Dysphagia, unspecified (R13.10) Plan: Discharge SLP treatment due to (comment)         Kathleen Lime, MS [place] Office [phone] Pager [phone]   Macario Golds  09/23/2022, 4:11 PM

## 2022-09-24 DIAGNOSIS — F1010 Alcohol abuse, uncomplicated: Secondary | ICD-10-CM | POA: Diagnosis not present

## 2022-09-24 DIAGNOSIS — G9340 Encephalopathy, unspecified: Secondary | ICD-10-CM | POA: Diagnosis not present

## 2022-09-24 DIAGNOSIS — E871 Hypo-osmolality and hyponatremia: Secondary | ICD-10-CM | POA: Diagnosis not present

## 2022-09-24 DIAGNOSIS — J449 Chronic obstructive pulmonary disease, unspecified: Secondary | ICD-10-CM | POA: Diagnosis not present

## 2022-09-24 NOTE — Progress Notes (Signed)
[place], is a 67 y.o. male, DOB - 10-23-1955, TMH:[id number] Admit date - 09/19/2022    Outpatient Primary MD for the patient is Demetrios Isaacs, FNP  LOS - 4  days    Brief summary   [name] is a 67 y.o. male with medical history significant of COPD, EtOH and cocaine abuse, L BKA. Pt is frequently hyponatremic as he drinks only Beer, no other EtOH. Pt with recent admission 12/21 for acute hypercapnic resp failure and EtOH withdrawal seizure.  Required intubation.  Able to come off of vent on 12/25.  Then spent another 11 days in hospital while medical team, family, all tried to convince him to go to ALF/SNF, patient refused and went home (lives with some roommates)Pysch saw him, decided he had decision making capacity.  APS also involved. During this admission, pt initially denied drinking since discharge, family disagrees noting open beer cans in room when they found him with AMS / generalized weakness. In the ED, pt was found to hyponatremia and admitted for further management.    Assessment & Plan    Assessment and Plan:    Acute metabolic encephalopathy: - much improved. Probably from hyponatremia vs substance abuse.  -Flu, RSV, COVID are negative UDS is positive for cocaine, barbiturates. CT head is negative. Pt is alert and oriented, no new complaints.     Hyponatremia  probably secondary to alcohol use improving. Sodium is stable at 132  No new labs today.   COPD (chronic obstructive pulmonary disease) (HCC) No wheezing heard on exam.  Continue with bronchodilators.   ETOH abuse No signs of withdrawal.    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  Stable and is on RA.   HTN (hypertension), benign Blood pressure parameters are well controlled.   continue with amlodipine and lisinopril.   Anemia of chronic disease.   Hemoglobin around 9. Continue to monitor.     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 Advance diet as tolerated.  Therapy evaluations recommending SNF   Patient is medically stable for discharge. But he says he cannot go home and he does not have any help at home. He wants to go to SNF. Family unable to assist or help him.     RN Pressure Injury Documentation: Pressure Injury 09/05/22 Elbow Left;Posterior Stage 2 -  Partial thickness loss of dermis presenting as a shallow open injury with a red, pink wound bed without slough. (Active)  09/05/22 0800  Location: Elbow  Location Orientation: Left;Posterior  Staging: Stage 2 -  Partial thickness loss of dermis presenting as a shallow open injury with a red, pink wound bed without slough.  Wound Description (Comments):   Present on Admission:   Dressing Type Foam - Lift dressing to assess site every shift 09/23/22 2000     Pressure Injury Ischial tuberosity Left;Posterior;Proximal Stage 2 -  Partial thickness loss of dermis presenting as a shallow open injury with a red, pink wound bed without slough. (Active)  Location: Ischial tuberosity  Location Orientation: Left;Posterior;Proximal  Staging: Stage 2 -  Partial thickness loss of dermis presenting as a shallow open injury with a red, pink wound bed without slough.  Wound Description (Comments):   Present on Admission:   Dressing Type Foam - Lift dressing to assess site every shift 09/23/22 0900     Estimated body mass index is 22.25 kg/m as calculated from the following:   Height as of this encounter: 5\' 4"  (1.626 m).   Weight as of this encounter: 58.8 kg.  Code Status: full code.  DVT Prophylaxis:  enoxaparin (LOVENOX) injection 40 mg Start: 09/20/22 1000   Level of Care: Level of care: Progressive Family Communication: none at bedside.   Disposition Plan:     Remains inpatient appropriate:  SNF when stable  Procedures:   None.   Consultants:   None.   Antimicrobials:   Anti-infectives (From admission, onward)    None        Medications  Scheduled Meds:  amLODipine  10 mg Oral Daily   enoxaparin (LOVENOX) injection  40 mg Subcutaneous Daily   fluticasone furoate-vilanterol  1 puff Inhalation Daily   folic acid  1 mg Oral Daily   lisinopril  10 mg Oral Daily   multivitamin with minerals  1 tablet Oral Daily   pantoprazole  40 mg Oral Daily   thiamine  100 mg Oral Daily   Or   thiamine  100 mg Intravenous Daily   Continuous Infusions: PRN Meds:.acetaminophen **OR** acetaminophen, albuterol, guaiFENesin-dextromethorphan, ibuprofen, ondansetron **OR** ondansetron (ZOFRAN) IV    Subjective:   11/19/22 was seen and examined today.   No new complaints today.   Objective:   Vitals:   09/23/22 2258 09/24/22 0601 09/24/22 0822 09/24/22 1231  BP: (!) 143/83 (!) 143/88  133/83  Pulse: 96 95  96  Resp: 20 16  16   Temp: 98.2 F (36.8 C) 98 F (36.7 C)  97.9 F (36.6 C)  TempSrc: Oral Oral  Oral  SpO2: 100% 100% 99% 100%  Weight:      Height:        Intake/Output Summary (Last 24 hours) at 09/24/2022 1709 Last data filed at 09/24/2022 0900 Gross per 24 hour  Intake 480 ml  Output 1400 ml  Net -920 ml    Filed Weights   09/23/22 1600  Weight: 58.8 kg     Exam General exam: Appears calm and comfortable  Respiratory system: Clear to auscultation. Respiratory effort normal. Cardiovascular system: S1 & S2 heard, RRR. No JVD, . No pedal edema. Gastrointestinal system: Abdomen is nondistended, soft and nontender.  Central nervous system: Alert and oriented. No focal neurological deficits. Extremities: left BKA.  Skin: No rashes seen.  Psychiatry: MOOD APPROPRIATE.      Data Reviewed:  I have personally reviewed following labs and imaging studies   CBC Lab Results  Component Value Date   WBC 8.5 09/22/2022   RBC 3.02 (L) 09/22/2022   HGB 9.3 (L) 09/22/2022    HCT 28.5 (L) 09/22/2022   MCV 94.4 09/22/2022   MCH 30.8 09/22/2022   PLT 601 (H) 09/22/2022   MCHC 32.6 09/22/2022   RDW 14.4 09/22/2022   LYMPHSABS 1.2 09/19/2022   MONOABS 0.5 09/19/2022   EOSABS 0.4 09/19/2022   BASOSABS 0.0 09/19/2022     Last metabolic panel Lab Results  Component Value Date   NA 132 (L) 09/22/2022   K 3.6 09/22/2022   CL 99 09/22/2022  CO2 27 09/22/2022   BUN 5 (L) 09/22/2022   CREATININE 0.43 (L) 09/22/2022   GLUCOSE 84 09/22/2022   GFRNONAA >60 09/22/2022   GFRAA >60 03/24/2019   CALCIUM 8.4 (L) 09/22/2022   PHOS 3.8 09/15/2022   PROT 6.8 09/19/2022   ALBUMIN 3.1 (L) 09/19/2022   BILITOT 0.7 09/19/2022   ALKPHOS 71 09/19/2022   AST 17 09/19/2022   ALT 18 09/19/2022   ANIONGAP 6 09/22/2022    CBG (last 3)  No results for input(s): "GLUCAP" in the last 72 hours.    Coagulation Profile: No results for input(s): "INR", "PROTIME" in the last 168 hours.   Radiology Studies: No results found.     Hosie Poisson M.D. [facility] 09/24/2022, 5:09 PM  Available via Epic secure chat 7am-7pm After 7 pm, please refer to night coverage provider listed on amion.
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
## 2022-09-24 NOTE — Progress Notes (Signed)
PT Cancellation Note  Patient Details Name: [name] MRN: [id number] DOB: 02/01/1956   Cancelled Treatment:     PT attempted this am and again this pm.  Pt deferred am 2* c/o fatigue and adamantly refused in pm stating he saw no reason to get up or to get to the chair and he would do it when he felt better!  Will follow.   Akya Fiorello 09/24/2022, 2:10 PM

## 2022-09-25 DIAGNOSIS — E871 Hypo-osmolality and hyponatremia: Secondary | ICD-10-CM | POA: Diagnosis not present

## 2022-09-25 DIAGNOSIS — J449 Chronic obstructive pulmonary disease, unspecified: Secondary | ICD-10-CM | POA: Diagnosis not present

## 2022-09-25 DIAGNOSIS — F1010 Alcohol abuse, uncomplicated: Secondary | ICD-10-CM | POA: Diagnosis not present

## 2022-09-25 DIAGNOSIS — G9340 Encephalopathy, unspecified: Secondary | ICD-10-CM | POA: Diagnosis not present

## 2022-09-25 NOTE — Progress Notes (Signed)
Ronald [name], is a 67 y.o. male, DOB - 1956/06/06, SJG:[id number] Admit date - 09/19/2022    Outpatient Primary MD for the patient is Demetrios Isaacs, FNP  LOS - 5  days    Brief summary   [name] is a 67 y.o. male with medical history significant of COPD, EtOH and cocaine abuse, L BKA. Pt is frequently hyponatremic as he drinks only Beer, no other EtOH. Pt with recent admission 12/21 for acute hypercapnic resp failure and EtOH withdrawal seizure.  Required intubation.  Able to come off of vent on 12/25.  Then spent another 11 days in hospital while medical team, family, all tried to convince him to go to ALF/SNF, patient refused and went home (lives with some roommates)Pysch saw him, decided he had decision making capacity.  APS also involved. During this admission, pt initially denied drinking since discharge, family disagrees noting open beer cans in room when they found him with AMS / generalized weakness. In the ED, pt was found to hyponatremia and admitted for further management.    Assessment & Plan    Assessment and Plan:    Acute metabolic encephalopathy: - much improved. Probably from hyponatremia vs substance abuse.  -Flu, RSV, COVID are negative UDS is positive for cocaine, barbiturates. CT head is negative. Pt is alert and oriented, no new complaints.     Hyponatremia  probably secondary to alcohol use improving. Sodium is stable at 132  No new labs today.   COPD (chronic obstructive pulmonary disease) (HCC) No wheezing heard on exam.  Continue with bronchodilators.   ETOH abuse No signs of withdrawal.    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  Stable and is on RA.   HTN (hypertension), benign Blood pressure parameters are well controlled.   continue with amlodipine and lisinopril.   Anemia of chronic disease.   Hemoglobin around 9. Continue to monitor.     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 Advance diet as tolerated.  Therapy evaluations recommending SNF   Patient is medically stable for discharge. But he says he cannot go home and he does not have any help at home. He wants to go to SNF. Family unable to assist or help him.     RN Pressure Injury Documentation: Pressure Injury 09/05/22 Elbow Left;Posterior Stage 2 -  Partial thickness loss of dermis presenting as a shallow open injury with a red, pink wound bed without slough. (Active)  09/05/22 0800  Location: Elbow  Location Orientation: Left;Posterior  Staging: Stage 2 -  Partial thickness loss of dermis presenting as a shallow open injury with a red, pink wound bed without slough.  Wound Description (Comments):   Present on Admission:   Dressing Type Foam - Lift dressing to assess site every shift 09/24/22 2000     Pressure Injury Ischial tuberosity Left;Posterior;Proximal Stage 2 -  Partial thickness loss of dermis presenting as a shallow open injury with a red, pink wound bed without slough. (Active)  Location: Ischial tuberosity  Location Orientation: Left;Posterior;Proximal  Staging: Stage 2 -  Partial thickness loss of dermis presenting as a shallow open injury with a red, pink wound bed without slough.  Wound Description (Comments):   Present on Admission:   Dressing Type Foam - Lift dressing to assess site every shift 09/24/22 2000     Estimated body mass index is 22.25 kg/m as calculated from the following:   Height as of this encounter: 5\' 4"  (1.626 m).   Weight as of this encounter: 58.8 kg.  Code Status: full code.  DVT Prophylaxis:  enoxaparin (LOVENOX) injection 40 mg Start: 09/20/22 1000   Level of Care: Level of care: Progressive Family Communication: none at bedside.   Disposition Plan:     Remains inpatient appropriate:  SNF when stable  Procedures:   None.   Consultants:   None.   Antimicrobials:   Anti-infectives (From admission, onward)    None        Medications  Scheduled Meds:  amLODipine  10 mg Oral Daily   enoxaparin (LOVENOX) injection  40 mg Subcutaneous Daily   fluticasone furoate-vilanterol  1 puff Inhalation Daily   folic acid  1 mg Oral Daily   lisinopril  10 mg Oral Daily   multivitamin with minerals  1 tablet Oral Daily   pantoprazole  40 mg Oral Daily   thiamine  100 mg Oral Daily   Or   thiamine  100 mg Intravenous Daily   Continuous Infusions: PRN Meds:.acetaminophen **OR** acetaminophen, albuterol, guaiFENesin-dextromethorphan, ibuprofen, ondansetron **OR** ondansetron (ZOFRAN) IV    Subjective:   11/19/22 was seen and examined today.   No new complaints.   Objective:   Vitals:   09/24/22 2108 09/25/22 0443 09/25/22 0905 09/25/22 1247  BP: 133/86 (!) 138/90  (!) 146/87  Pulse: 99 92  100  Resp: 17 18  16   Temp: 98.6 F (37 C) 97.7 F (36.5 C)  98.3 F (36.8 C)  TempSrc: Oral Oral  Oral  SpO2: 97% 97% 96% 100%  Weight:      Height:        Intake/Output Summary (Last 24 hours) at 09/25/2022 1656 Last data filed at 09/25/2022 1250 Gross per 24 hour  Intake 840 ml  Output 4500 ml  Net -3660 ml    Filed Weights   09/23/22 1600  Weight: 58.8 kg     Exam General exam: Appears calm and comfortable  Respiratory system: Clear to auscultation. Respiratory effort normal. Cardiovascular system: S1 & S2 heard, RRR. No JVD,  Gastrointestinal system: Abdomen is nondistended, soft and nontender. Central nervous system: Alert and oriented.  Extremities: Symmetric 5 x 5 power. Skin: No rashes,  Psychiatry: mood is appropriate.       Data Reviewed:  I have personally reviewed following labs and imaging studies   CBC Lab Results  Component Value Date   WBC 8.5 09/22/2022   RBC 3.02 (L) 09/22/2022   HGB 9.3 (L) 09/22/2022   HCT 28.5 (L) 09/22/2022   MCV 94.4  09/22/2022   MCH 30.8 09/22/2022   PLT 601 (H) 09/22/2022   MCHC 32.6 09/22/2022   RDW 14.4 09/22/2022   LYMPHSABS 1.2 09/19/2022   MONOABS 0.5 09/19/2022   EOSABS 0.4 09/19/2022   BASOSABS 0.0 09/19/2022     Last metabolic panel Lab Results  Component Value Date   NA 132 (L) 09/22/2022   K 3.6 09/22/2022   CL 99 09/22/2022   CO2 27 09/22/2022  BUN 5 (L) 09/22/2022   CREATININE 0.43 (L) 09/22/2022   GLUCOSE 84 09/22/2022   GFRNONAA >60 09/22/2022   GFRAA >60 03/24/2019   CALCIUM 8.4 (L) 09/22/2022   PHOS 3.8 09/15/2022   PROT 6.8 09/19/2022   ALBUMIN 3.1 (L) 09/19/2022   BILITOT 0.7 09/19/2022   ALKPHOS 71 09/19/2022   AST 17 09/19/2022   ALT 18 09/19/2022   ANIONGAP 6 09/22/2022    CBG (last 3)  No results for input(s): "GLUCAP" in the last 72 hours.    Coagulation Profile: No results for input(s): "INR", "PROTIME" in the last 168 hours.   Radiology Studies: No results found.     Hosie Poisson M.D. [facility] 09/25/2022, 4:56 PM  Available via Epic secure chat 7am-7pm After 7 pm, please refer to night coverage provider listed on amion.

## 2022-09-26 DIAGNOSIS — E871 Hypo-osmolality and hyponatremia: Secondary | ICD-10-CM | POA: Diagnosis not present

## 2022-09-26 DIAGNOSIS — G9340 Encephalopathy, unspecified: Secondary | ICD-10-CM | POA: Diagnosis not present

## 2022-09-26 DIAGNOSIS — F1010 Alcohol abuse, uncomplicated: Secondary | ICD-10-CM | POA: Diagnosis not present

## 2022-09-26 DIAGNOSIS — J449 Chronic obstructive pulmonary disease, unspecified: Secondary | ICD-10-CM | POA: Diagnosis not present

## 2022-09-26 NOTE — Plan of Care (Signed)
  Problem: Activity: Goal: Risk for activity intolerance will decrease Outcome: Progressing   Problem: Nutrition: Goal: Adequate nutrition will be maintained Outcome: Progressing   Problem: Safety: Goal: Ability to remain free from injury will improve Outcome: Progressing   

## 2022-09-26 NOTE — Progress Notes (Signed)
Ronald [name], is a 67 y.o. male, DOB - 1956/06/08, [id number] Admit date - 09/19/2022    Outpatient Primary MD for the patient is Ronald Isaacs, FNP  LOS - 6  days    Brief summary   Ronald [name] is a 67 y.o. male with medical history significant of COPD, EtOH and cocaine abuse, L BKA. Pt is frequently hyponatremic as he drinks only Beer, no other EtOH. Pt with recent admission 12/21 for acute hypercapnic resp failure and EtOH withdrawal seizure.  Required intubation.  Able to come off of vent on 12/25.  Then spent another 11 days in hospital while medical team, family, all tried to convince him to go to ALF/SNF, patient refused and went home (lives with some roommates)Pysch saw him, decided he had decision making capacity.  APS also involved. During this admission, pt initially denied drinking since discharge, family disagrees noting open beer cans in room when they found him with AMS / generalized weakness. In the ED, pt was found to hyponatremia and admitted for further management.\ Patient is currently medically stable for discharge. Currently working for a safe discharge, TOC on board.    Assessment & Plan    Assessment and Plan: Acute metabolic encephalopathy: - much improved. Probably from hyponatremia vs substance abuse.  -Flu, RSV, COVID are negative UDS is positive for cocaine, barbiturates. CT head is negative. Patient is alert oriented, able to answer all questions.    Hyponatremia  probably secondary to alcohol use improving. Sodium is stable at 132 Will check labs in the morning  COPD (chronic obstructive pulmonary disease) (HCC) No wheezing heard on exam Continue with bronchodilators.   ETOH abuse No signs of withdrawal.    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  Stable and is on RA.   HTN (hypertension),  benign Blood pressure parameters are optimal  continue with amlodipine and lisinopril.   Anemia of chronic disease.  Hemoglobin around 9. Continue to monitor. Repeat labs in the morning    Dysphagia SLP evaluation recommended MBS was found to have esophageal dysmotility. Advance diet as tolerated.   Tachycardia Asymptomatic  Therapy evaluations recommending SNF   Patient is medically stable for discharge. But he says he cannot go home and he does not have any help at home. He wants to go to SNF. Family unable to assist or help him.     RN Pressure Injury Documentation: Pressure Injury 09/05/22 Elbow Left;Posterior Stage 2 -  Partial thickness loss of dermis presenting as a shallow open injury with a red, pink wound bed without slough. (Active)  09/05/22 0800  Location: Elbow  Location Orientation: Left;Posterior  Staging: Stage 2 -  Partial thickness loss of dermis presenting as a shallow open injury with a red, pink wound bed without slough.  Wound Description (Comments):   Present on Admission:   Dressing Type Foam - Lift dressing to assess site every shift 09/25/22 2000     Pressure Injury Ischial tuberosity Left;Posterior;Proximal Stage 2 -  Partial thickness  loss of dermis presenting as a shallow open injury with a red, pink wound bed without slough. (Active)     Location: Ischial tuberosity  Location Orientation: Left;Posterior;Proximal  Staging: Stage 2 -  Partial thickness loss of dermis presenting as a shallow open injury with a red, pink wound bed without slough.  Wound Description (Comments):   Present on Admission:   Dressing Type Foam - Lift dressing to assess site every shift 09/25/22 2000     Estimated body mass index is 22.25 kg/m as calculated from the following:   Height as of this encounter: 5\' 4"  (1.626 m).   Weight as of this encounter: 58.8 kg.  Code Status: full code.  DVT Prophylaxis:  enoxaparin (LOVENOX) injection 40 mg Start: 09/20/22  1000   Level of Care: Level of care: Med-Surg Family Communication: none at bedside.   Disposition Plan:     Remains inpatient appropriate:  SNF when stable  Procedures:  None.   Consultants:   None.   Antimicrobials:   Anti-infectives (From admission, onward)    None        Medications  Scheduled Meds:  amLODipine  10 mg Oral Daily   enoxaparin (LOVENOX) injection  40 mg Subcutaneous Daily   fluticasone furoate-vilanterol  1 puff Inhalation Daily   folic acid  1 mg Oral Daily   lisinopril  10 mg Oral Daily   multivitamin with minerals  1 tablet Oral Daily   pantoprazole  40 mg Oral Daily   thiamine  100 mg Oral Daily   Or   thiamine  100 mg Intravenous Daily   Continuous Infusions: PRN Meds:.acetaminophen **OR** acetaminophen, albuterol, guaiFENesin-dextromethorphan, ibuprofen, ondansetron **OR** ondansetron (ZOFRAN) IV    Subjective:   11/19/22 was seen and examined today.   Appears frustrated  Objective:   Vitals:   09/26/22 0449 09/26/22 0853 09/26/22 1428 09/26/22 1443  BP: (!) 123/92  119/81   Pulse: 93  (!) 108 (!) 110  Resp: 18  (!) 22 18  Temp: 98.5 F (36.9 C)  98.3 F (36.8 C)   TempSrc: Oral  Oral   SpO2: 98% 99% 100%   Weight:      Height:        Intake/Output Summary (Last 24 hours) at 09/26/2022 1727 Last data filed at 09/26/2022 0538 Gross per 24 hour  Intake 240 ml  Output 1900 ml  Net -1660 ml    Filed Weights   09/23/22 1600  Weight: 58.8 kg     Exam General exam: Appears calm and comfortable  Respiratory system: Clear to auscultation. Respiratory effort normal. Cardiovascular system: S1 & S2 heard, RRR. No JVD, No pedal edema. Gastrointestinal system: Abdomen is nondistended, soft and nontender.  Central nervous system: Alert and oriented. No focal neurological deficits. Extremities: Symmetric 5 x 5 power. Skin: No rashes,  Psychiatry:. Mood & affect appropriate.       Data Reviewed:  I have personally  reviewed following labs and imaging studies   CBC Lab Results  Component Value Date   WBC 8.5 09/22/2022   RBC 3.02 (L) 09/22/2022   HGB 9.3 (L) 09/22/2022   HCT 28.5 (L) 09/22/2022   MCV 94.4 09/22/2022   MCH 30.8 09/22/2022   PLT 601 (H) 09/22/2022   MCHC 32.6 09/22/2022   RDW 14.4 09/22/2022   LYMPHSABS 1.2 09/19/2022   MONOABS 0.5 09/19/2022   EOSABS 0.4 09/19/2022   BASOSABS 0.0 09/19/2022     Last metabolic panel Lab Results  Component Value Date   NA 132 (L) 09/22/2022   K 3.6 09/22/2022   CL 99 09/22/2022   CO2 27 09/22/2022   BUN 5 (L) 09/22/2022   CREATININE 0.43 (L) 09/22/2022   GLUCOSE 84 09/22/2022   GFRNONAA >60 09/22/2022   GFRAA >60 03/24/2019   CALCIUM 8.4 (L) 09/22/2022   PHOS 3.8 09/15/2022   PROT 6.8 09/19/2022   ALBUMIN 3.1 (L) 09/19/2022   BILITOT 0.7 09/19/2022   ALKPHOS 71 09/19/2022   AST 17 09/19/2022   ALT 18 09/19/2022   ANIONGAP 6 09/22/2022    CBG (last 3)  No results for input(s): "GLUCAP" in the last 72 hours.    Coagulation Profile: No results for input(s): "INR", "PROTIME" in the last 168 hours.   Radiology Studies: No results found.     Hosie Poisson M.D. [facility] 09/26/2022, 5:27 PM  Available via Epic secure chat 7am-7pm After 7 pm, please refer to night coverage provider listed on amion.

## 2022-09-27 DIAGNOSIS — F1010 Alcohol abuse, uncomplicated: Secondary | ICD-10-CM | POA: Diagnosis not present

## 2022-09-27 DIAGNOSIS — J449 Chronic obstructive pulmonary disease, unspecified: Secondary | ICD-10-CM | POA: Diagnosis not present

## 2022-09-27 DIAGNOSIS — E871 Hypo-osmolality and hyponatremia: Secondary | ICD-10-CM | POA: Diagnosis not present

## 2022-09-27 DIAGNOSIS — G9340 Encephalopathy, unspecified: Secondary | ICD-10-CM | POA: Diagnosis not present

## 2022-09-27 LAB — CBC WITH DIFFERENTIAL/PLATELET
Abs Immature Granulocytes: 0.15 10*3/uL — ABNORMAL HIGH (ref 0.00–0.07)
Basophils Absolute: 0 10*3/uL (ref 0.0–0.1)
Basophils Relative: 1 %
Eosinophils Absolute: 0.4 10*3/uL (ref 0.0–0.5)
Eosinophils Relative: 5 %
HCT: 30.6 % — ABNORMAL LOW (ref 39.0–52.0)
Hemoglobin: 10 g/dL — ABNORMAL LOW (ref 13.0–17.0)
Immature Granulocytes: 2 %
Lymphocytes Relative: 20 %
Lymphs Abs: 1.6 10*3/uL (ref 0.7–4.0)
MCH: 31 pg (ref 26.0–34.0)
MCHC: 32.7 g/dL (ref 30.0–36.0)
MCV: 94.7 fL (ref 80.0–100.0)
Monocytes Absolute: 0.7 10*3/uL (ref 0.1–1.0)
Monocytes Relative: 9 %
Neutro Abs: 5.1 10*3/uL (ref 1.7–7.7)
Neutrophils Relative %: 63 %
Platelets: 345 10*3/uL (ref 150–400)
RBC: 3.23 MIL/uL — ABNORMAL LOW (ref 4.22–5.81)
RDW: 15.3 % (ref 11.5–15.5)
WBC: 8 10*3/uL (ref 4.0–10.5)
nRBC: 0 % (ref 0.0–0.2)

## 2022-09-27 LAB — BASIC METABOLIC PANEL
Anion gap: 7 (ref 5–15)
BUN: 13 mg/dL (ref 8–23)
CO2: 28 mmol/L (ref 22–32)
Calcium: 8.9 mg/dL (ref 8.9–10.3)
Chloride: 96 mmol/L — ABNORMAL LOW (ref 98–111)
Creatinine, Ser: 0.61 mg/dL (ref 0.61–1.24)
GFR, Estimated: >60 mL/min (ref >60)
Glucose, Bld: 95 mg/dL (ref 70–99)
Potassium: 4.1 mmol/L (ref 3.5–5.1)
Sodium: 131 mmol/L — ABNORMAL LOW (ref 135–145)

## 2022-09-27 NOTE — Progress Notes (Signed)
Physical Therapy Treatment Patient Details Name: Ronald [name] MRN: [id number] DOB: July 25, 1956 Today's Date: 09/27/2022   History of Present Illness Pt is a 67yo male presenting to [facility] ED on 09/19/22 with AMS and generalized weakness, found by family with open beer canes in room. Recent hospitalization 09/02/22-09/17/22 with dyspnea, increased WOB, wheezing, required intubation. PMH: gout, polysubstance abuse, HTN, L transtibial amputation, COPD.    PT Comments    Pt received supine in bed perseverating on soda and candy. Pt demonstrated modified independence with bed mobility, requiring MAX encouragement to perform bed mobility, attempted to encourage pt to complete transfer to recliner, pt adamantly refusing despite encouragement and education on the importance of changing positions and verticality; pt agitated and yelling loudly enough NT came into room to check status. Pt returned to supine positioning with modified independence, repositioned for comfort, further mobility deferred. Pt is not progressing due to repeated refusals to mobilize and when asked to perform mobility becomes agitated, yelling at this therapist. Pt is self-limiting and prefers to remain in bed per his own admission during this session. We will continue to follow acutely.   Recommendations for follow up therapy are one component of a multi-disciplinary discharge planning process, led by the attending physician.  Recommendations may be updated based on patient status, additional functional criteria and insurance authorization.  Follow Up Recommendations  Skilled nursing-short term rehab (<3 hours/day) (vs HH services. Pt would benefit from SNF-level therapies, if denied or refused then [facility].) Can patient physically be transported by private vehicle: No   Assistance Recommended at Discharge Frequent or constant Supervision/Assistance  Patient can return home with the following Assistance with  cooking/housework;Direct supervision/assist for financial management;Direct supervision/assist for medications management;Assist for transportation;A little help with walking and/or transfers;A little help with bathing/dressing/bathroom   Equipment Recommendations  None recommended by PT (Pt reports having WC at home from prior admission)    Recommendations for Other Services       Precautions / Restrictions Precautions Precautions: Fall Precaution Comments: 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) Restrictions Weight Bearing Restrictions: Yes RLE Weight Bearing: Weight bearing as tolerated Other Position/Activity Restrictions: in hard sole shoe per ortho (09/14/2022)     Mobility  Bed Mobility Overal bed mobility: Needs Assistance Bed Mobility: Supine to Sit, Sit to Supine     Supine to sit: Modified independent (Device/Increase time) Sit to supine: Modified independent (Device/Increase time)   General bed mobility comments: Pt required max encouragement for bed mobility, demonstrated modified independent for supine to sit, sat EOB for ~5s, then reporting "I don't want to get up, I don't feel safe! Why are we doing this!?" Yelling with enough volume NT came into the room to check on session. Pt repositioned himself supine in bed, adjusted pillows for comfort, further mobility deferred    Transfers                   General transfer comment: Pt declined transfer adamantly citing "I just get up and transfer to my chair, man!"    Ambulation/Gait               General Gait Details: unable   Stairs             Wheelchair Mobility    Modified Rankin (Stroke Patients Only)       Balance Overall balance assessment: Needs assistance Sitting-balance support: No upper extremity supported, Feet supported Sitting balance-Leahy Scale: Good Sitting balance - Comments: Sitting EOB to  complete UB strengthening exercises. Postural control: Right lateral lean      Standing balance comment: Did not test                            Cognition Arousal/Alertness: Awake/alert Behavior During Therapy: Agitated Overall Cognitive Status: Difficult to assess Area of Impairment: Memory, Safety/judgement, Awareness, Problem solving                     Memory: Decreased short-term memory   Safety/Judgement: Decreased awareness of safety, Decreased awareness of deficits Awareness: Emergent Problem Solving: Requires verbal cues, Slow processing General Comments: Diffiuclt to assess cognition due to patient being somewhat non-participatory and limtied verbalizations. He was able to state it was [place] and 2024        Exercises      General Comments        Pertinent Vitals/Pain Pain Assessment Pain Assessment: No/denies pain Pain Location: R leg Pain Descriptors / Indicators: Discomfort, Grimacing, Guarding Pain Intervention(s): Monitored during session, Limited activity within patient's tolerance, Repositioned    Home Living                          Prior Function            PT Goals (current goals can now be found in the care plan section) Acute Rehab PT Goals Patient Stated Goal: To go home PT Goal Formulation: With patient Time For Goal Achievement: 10/06/22 Potential to Achieve Goals: Fair Progress towards PT goals: Not progressing toward goals - comment (Pt continually refusing Therapy)    Frequency    Min 3X/week      PT Plan Current plan remains appropriate    Co-evaluation              AM-PAC PT "6 Clicks" Mobility   Outcome Measure  Help needed turning from your back to your side while in a flat bed without using bedrails?: None Help needed moving from lying on your back to sitting on the side of a flat bed without using bedrails?: None Help needed moving to and from a bed to a chair (including a wheelchair)?: A Little Help needed standing up from a chair using your arms (e.g.,  wheelchair or bedside chair)?: Total Help needed to walk in hospital room?: Total Help needed climbing 3-5 steps with a railing? : Total 6 Click Score: 14    End of Session   Activity Tolerance: Treatment limited secondary to agitation (Pt yelling at therapist) Patient left: with call bell/phone within reach;in bed;with bed alarm set;with nursing/sitter in room Nurse Communication: Mobility status PT Visit Diagnosis: Other abnormalities of gait and mobility (R26.89);Muscle weakness (generalized) (M62.81);History of falling (Z91.81)     Time: 4166-0630 PT Time Calculation (min) (ACUTE ONLY): 8 min  Charges:  $Therapeutic Activity: 8-22 mins                     Coolidge Breeze, PT, DPT [facility] Rehabilitation Department Office: [phone] Weekend pager: [phone]   Coolidge Breeze 09/27/2022, 4:01 PM

## 2022-09-27 NOTE — Progress Notes (Signed)
Ronald [name], is a 67 y.o. male, DOB - 09-02-1956, EQA:[id number] Admit date - 09/19/2022    Outpatient Primary MD for the patient is Demetrios Isaacs, FNP  LOS - 7  days    Brief summary   Ronald [name] is a 67 y.o. male with medical history significant of COPD, EtOH and cocaine abuse, L BKA. Pt is frequently hyponatremic as he drinks only Beer, no other EtOH. Pt with recent admission 12/21 for acute hypercapnic resp failure and EtOH withdrawal seizure.  Required intubation.  Able to come off of vent on 12/25.  Then spent another 11 days in hospital while medical team, family, all tried to convince him to go to ALF/SNF, patient refused and went home (lives with some roommates)Pysch saw him, decided he had decision making capacity.  APS also involved. During this admission, pt initially denied drinking since discharge, family disagrees noting open beer cans in room when they found him with AMS / generalized weakness. In the ED, pt was found to hyponatremia and admitted for further management.\ Patient is currently medically stable for discharge. Currently working for a safe discharge, TOC on board. Patient seen , has been refusing to work with PT, does not want to go home by himself. Requesting me to talk to the sisters to arrange for SNF.     Assessment & Plan    Assessment and Plan: Acute metabolic encephalopathy: - much improved. Probably from hyponatremia vs substance abuse.  -Flu, RSV, COVID are negative UDS is positive for cocaine, barbiturates. CT head is negative. Patient is alert oriented, able to answer all questions.    Hyponatremia PROBABLY secondary to alcohol intake , with a component of SIADH.  He is currently asymptomatic .    COPD (chronic obstructive pulmonary disease) (HCC) No wheezing heard on exam.  Continue with albuterol as needed, Breo Ellipta  daily, . No change in medications.   ETOH abuse No signs of withdrawal.    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  Stable and is on RA.   HTN (hypertension), benign BP has been optimal. Resume lisinopril and amlodipine.     Anemia of chronic disease.  Hemoglobin improved from 9 to 10.Marland Kitchen Continue to monitor.   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 Advance diet as tolerated. On PPI.    Tachycardia Asymptomatic, resolved.   Therapy evaluations recommending SNF   Patient is medically stable for discharge. But he says he cannot go home and he does not have any help at home. He wants to go to SNF. Family unable to assist or help him at home. The sister is talking to [place] to see if they can take with the resources that he has. FL2 has been initiated.   Pressure injury Present on admission.  RN Pressure Injury Documentation: Pressure Injury 09/05/22 Elbow Left;Posterior Stage 2 -  Partial thickness loss of dermis presenting as a shallow open injury with a red, pink wound bed without slough. (Active)  09/05/22 0800  Location: Elbow  Location Orientation: Left;Posterior  Staging: Stage 2 -  Partial thickness loss of dermis presenting as a shallow open injury with a red, pink wound bed without slough.  Wound Description (Comments):   Present on Admission:   Dressing Type Foam - Lift dressing to assess site every shift 09/26/22 2200     Pressure Injury Ischial tuberosity Left;Posterior;Proximal Stage 2 -  Partial thickness loss of dermis presenting as a shallow open injury with a red, pink wound bed without slough. (Active)     Location: Ischial tuberosity  Location Orientation: Left;Posterior;Proximal  Staging: Stage 2 -  Partial thickness loss of dermis presenting as a shallow open injury with a red, pink wound bed without slough.  Wound Description (Comments):   Present on Admission:   Dressing  Type Foam - Lift dressing to assess site every shift 09/26/22 2200   Foam dressing.  Estimated body mass index is 22.25 kg/m as calculated from the following:   Height as of this encounter: 5\' 4"  (1.626 m).   Weight as of this encounter: 58.8 kg.  Code Status: full code.  DVT Prophylaxis:  enoxaparin (LOVENOX) injection 40 mg Start: 09/20/22 1000   Level of Care: Level of care: Med-Surg Family Communication: none at bedside.   Disposition Plan:     Remains inpatient appropriate:  SNF when stable  Procedures:  None.   Consultants:   None.   Antimicrobials:   Anti-infectives (From admission, onward)    None        Medications  Scheduled Meds:  amLODipine  10 mg Oral Daily   enoxaparin (LOVENOX) injection  40 mg Subcutaneous Daily   fluticasone furoate-vilanterol  1 puff Inhalation Daily   folic acid  1 mg Oral Daily   lisinopril  10 mg Oral Daily   multivitamin with minerals  1 tablet Oral Daily   pantoprazole  40 mg Oral Daily   thiamine  100 mg Oral Daily   Or   thiamine  100 mg Intravenous Daily   Continuous Infusions: PRN Meds:.acetaminophen **OR** acetaminophen, albuterol, guaiFENesin-dextromethorphan, ibuprofen, ondansetron **OR** ondansetron (ZOFRAN) IV    Subjective:   Ronald [name] was seen and examined today.   Agitated, doe snot want to work with PT, does not want to go home.  Objective:   Vitals:   09/26/22 1443 09/26/22 2123 09/27/22 0629 09/27/22 1344  BP:  112/66 134/86 134/81  Pulse: (!) 110 (!) 102 99 94  Resp: 18 19 19 20   Temp:  99.1 F (37.3 C) 98.8 F (37.1 C) 98.6 F (37 C)  TempSrc:  Oral  Oral  SpO2:  98% 100% 100%  Weight:      Height:        Intake/Output Summary (Last 24 hours) at 09/27/2022 1837 Last data filed at 09/27/2022 1345 Gross per 24 hour  Intake 360 ml  Output 970 ml  Net -610 ml    Filed Weights   09/23/22 1600  Weight: 58.8 kg     Exam General exam: Appears calm and comfortable  Respiratory  system: Clear to auscultation. Respiratory effort normal. Cardiovascular system: S1 & S2 heard, RRR. No JVD,  No pedal edema. Gastrointestinal system: Abdomen is nondistended, soft and nontender.  Central nervous system: Alert and oriented.grossly non focal.  Extremities: left BKA. Skin: No rashes, seen Psychiatry:. Restless, frustrated.         Data Reviewed:  I have personally reviewed following labs and imaging studies   CBC Lab  Results  Component Value Date   WBC 8.0 09/27/2022   RBC 3.23 (L) 09/27/2022   HGB 10.0 (L) 09/27/2022   HCT 30.6 (L) 09/27/2022   MCV 94.7 09/27/2022   MCH 31.0 09/27/2022   PLT 345 09/27/2022   MCHC 32.7 09/27/2022   RDW 15.3 09/27/2022   LYMPHSABS 1.6 09/27/2022   MONOABS 0.7 09/27/2022   EOSABS 0.4 09/27/2022   BASOSABS 0.0 09/27/2022     Last metabolic panel Lab Results  Component Value Date   NA 131 (L) 09/27/2022   K 4.1 09/27/2022   CL 96 (L) 09/27/2022   CO2 28 09/27/2022   BUN 13 09/27/2022   CREATININE 0.61 09/27/2022   GLUCOSE 95 09/27/2022   GFRNONAA >60 09/27/2022   GFRAA >60 03/24/2019   CALCIUM 8.9 09/27/2022   PHOS 3.8 09/15/2022   PROT 6.8 09/19/2022   ALBUMIN 3.1 (L) 09/19/2022   BILITOT 0.7 09/19/2022   ALKPHOS 71 09/19/2022   AST 17 09/19/2022   ALT 18 09/19/2022   ANIONGAP 7 09/27/2022    CBG (last 3)  No results for input(s): "GLUCAP" in the last 72 hours.    Coagulation Profile: No results for input(s): "INR", "PROTIME" in the last 168 hours.   Radiology Studies: No results found.     Kathlen Mody M.D. [facility] 09/27/2022, 6:37 PM  Available via Epic secure chat 7am-7pm After 7 pm, please refer to night coverage provider listed on amion.

## 2022-09-27 NOTE — [place] FL2 (Signed)
[place] LEVEL OF CARE FORM     IDENTIFICATION  Patient Name: [name] Birthdate: 28-Jan-1956 Sex: male Admission Date (Current Location): 09/19/2022  [facility] [place] Number:  Herbalist and Address:  [facility],  [place], [place]      Provider Number: [id number]  Attending Physician Name and Address:  Hosie Poisson, MD  Relative Name and Phone Number:       Current Level of Care: Hospital Recommended Level of Care: [place] Prior Approval Number:    Date Approved/Denied:   PASRR Number: [id number]  Discharge Plan: SNF    Current Diagnoses: Patient Active Problem List   Diagnosis Date Noted   Chronic hypercapnic respiratory failure ([place]) 09/19/2022   Protein-calorie malnutrition, severe 09/04/2022   Acute hypercapnic respiratory failure ([place]) 09/02/2022   Hx of BKA, left ([place]) 09/02/2022   Hypotension 09/02/2022   Right bundle branch block 09/02/2022   Acute respiratory failure ([place]) 09/02/2022   Hyponatremia 03/26/2019   Altered mental status 03/26/2019   Acute hypoxemic respiratory failure ([place]) 03/26/2019   Dysphagia 03/26/2019   Acute encephalopathy 03/18/2019   COPD exacerbation ([place]) 08/29/2017   Subdural hematoma ([place]) 06/19/2014   Critical lower limb ischemia ([place]) 12/25/2013   Osteomyelitis ([place]) 11/06/2013   HTN (hypertension), benign 11/06/2013   ETOH abuse 11/06/2013   Osteomyelitis of left foot ([place]) 11/06/2013   COPD (chronic obstructive pulmonary disease) ([place]) 11/06/2013   Accelerated hypertension 09/25/2013   Hypokalemia 09/25/2013   Abscess of left foot 09/21/2013   Cellulitis of left foot 09/19/2013    Orientation RESPIRATION BLADDER Height & Weight     Self, Time  Normal Incontinent Weight: 58.8 kg Height:  5\' 4"  (162.6 cm)  BEHAVIORAL SYMPTOMS/MOOD NEUROLOGICAL BOWEL NUTRITION STATUS      Incontinent Diet  AMBULATORY STATUS COMMUNICATION OF NEEDS  Skin   Extensive Assist Verbally Normal                       Personal Care Assistance Level of Assistance  Bathing, Feeding, Dressing Bathing Assistance: Maximum assistance Feeding assistance: Limited assistance Dressing Assistance: Maximum assistance     Functional Limitations Info  Sight, Hearing, Speech Sight Info: Adequate Hearing Info: Adequate Speech Info: Adequate    SPECIAL CARE FACTORS FREQUENCY  PT (By licensed PT), OT (By licensed OT)     PT Frequency: 5x weekly OT Frequency: 5x weekly            Contractures Contractures Info: Not present    Additional Factors Info  Code Status Code Status Info: Full Code             Current Medications (09/27/2022):  This is the current hospital active medication list Current Facility-Administered Medications  Medication Dose Route Frequency Provider Last Rate Last Admin   acetaminophen (TYLENOL) tablet 650 mg  650 mg Oral Q6H PRN Etta Quill, DO   650 mg at 09/22/22 1727   Or   acetaminophen (TYLENOL) suppository 650 mg  650 mg Rectal Q6H PRN Etta Quill, DO       albuterol (PROVENTIL) (2.5 MG/3ML) 0.083% nebulizer solution 2.5 mg  2.5 mg Nebulization Q4H PRN Etta Quill, DO       amLODipine (NORVASC) tablet 10 mg  10 mg Oral Daily Jennette Kettle M, DO   10 mg at 09/26/22 1434   enoxaparin (LOVENOX) injection 40 mg  40 mg Subcutaneous Daily Etta Quill, DO  40 mg at 09/26/22 1437   fluticasone furoate-vilanterol (BREO ELLIPTA) 100-25 MCG/ACT 1 puff  1 puff Inhalation Daily Jennette Kettle M, DO   1 puff at 89/21/19 4174   folic acid (FOLVITE) tablet 1 mg  1 mg Oral Daily Jennette Kettle M, DO   1 mg at 09/26/22 1435   guaiFENesin-dextromethorphan (ROBITUSSIN DM) 100-10 MG/5ML syrup 10 mL  10 mL Oral Q4H PRN Hosie Poisson, MD   10 mL at 09/26/22 2211   ibuprofen (ADVIL) tablet 200 mg  200 mg Oral Q6H PRN Hosie Poisson, MD   200 mg at 09/26/22 2211   lisinopril (ZESTRIL) tablet 10 mg  10 mg Oral  Daily Jennette Kettle M, DO   10 mg at 09/26/22 1435   multivitamin with minerals tablet 1 tablet  1 tablet Oral Daily Jennette Kettle M, DO   1 tablet at 09/26/22 1435   ondansetron (ZOFRAN) tablet 4 mg  4 mg Oral Q6H PRN Etta Quill, DO       Or   ondansetron [facility]) injection 4 mg  4 mg Intravenous Q6H PRN Etta Quill, DO   4 mg at 09/22/22 1951   pantoprazole (PROTONIX) EC tablet 40 mg  40 mg Oral Daily Alma Friendly, MD   40 mg at 09/26/22 1435   thiamine (VITAMIN B1) tablet 100 mg  100 mg Oral Daily Jennette Kettle M, DO   100 mg at 09/26/22 1435   Or   thiamine (VITAMIN B1) injection 100 mg  100 mg Intravenous Daily Jennette Kettle M, DO   100 mg at 09/19/22 2130     Discharge Medications: Please see discharge summary for a list of discharge medications.  Relevant Imaging Results:  Relevant Lab Results:   Additional Information SS# [id number]  Joaquin Courts, RN

## 2022-09-27 NOTE — Progress Notes (Signed)
PT Cancellation Note  Patient Details Name: Ronald [name] MRN: [id number] DOB: November 23, 1955   Cancelled Treatment:    Reason Eval/Treat Not Completed: (P) Patient declined, no reason specified; pt eating lunch and asking PT to come back despite encouragement. Will follow up as schedule and pt status allows which may be another day.  Coolidge Breeze, PT, DPT [facility] Department Office: [phone] Weekend pager: [phone]   Coolidge Breeze 09/27/2022, 2:48 PM

## 2022-09-27 NOTE — TOC Progression Note (Addendum)
Transition of Care [facility]) - Progression Note    Patient Details  Name: [name] MRN: [id number] Date of Birth: Jan 24, 1956  Transition of Care [facility]) CM/SW [place], LCSW Phone Number: 09/27/2022, 9:01 AM  Clinical Narrative:     Pt does not have SNF coverage through his insurance. Pt reported he is not able to pay out of pocket. TOC to follow.     ADDEN 930am CSW faxed out the request for independent assessment for personal care services. Pt will need to be at home for Medicaid to come and assess for [facility] services.       Expected Discharge Plan and Services                                               Social Determinants of Health (SDOH) Interventions SDOH Screenings   Food Insecurity: No Food Insecurity (09/20/2022)  Recent Concern: Food Insecurity - Food Insecurity Present (09/17/2022)  Housing: Medium Risk (09/20/2022)  Transportation Needs: Unmet Transportation Needs (09/20/2022)  Utilities: Not At Risk (09/20/2022)  Tobacco Use: High Risk (09/23/2022)    Readmission Risk Interventions     No data to display

## 2022-09-27 NOTE — Plan of Care (Signed)
  Problem: Activity: Goal: Risk for activity intolerance will decrease Outcome: Progressing   Problem: Nutrition: Goal: Adequate nutrition will be maintained Outcome: Progressing   Problem: Safety: Goal: Ability to remain free from injury will improve Outcome: Progressing   

## 2022-09-28 DIAGNOSIS — E871 Hypo-osmolality and hyponatremia: Secondary | ICD-10-CM | POA: Diagnosis not present

## 2022-09-28 DIAGNOSIS — I10 Essential (primary) hypertension: Secondary | ICD-10-CM | POA: Diagnosis not present

## 2022-09-28 DIAGNOSIS — J449 Chronic obstructive pulmonary disease, unspecified: Secondary | ICD-10-CM | POA: Diagnosis not present

## 2022-09-28 DIAGNOSIS — G9340 Encephalopathy, unspecified: Secondary | ICD-10-CM | POA: Diagnosis not present

## 2022-09-28 MED ORDER — GUAIFENESIN-DM 100-10 MG/5ML PO SYRP: 10.0000 mL | ORAL_SOLUTION | ORAL | 0 refills | Status: DC | PRN

## 2022-09-28 MED ORDER — ADULT MULTIVITAMIN W/MINERALS CH: 1.0000 | ORAL_TABLET | Freq: Every day | ORAL | Status: DC

## 2022-09-28 MED ORDER — PANTOPRAZOLE SODIUM 40 MG PO TBEC: 40.0000 mg | DELAYED_RELEASE_TABLET | Freq: Every day | ORAL | 1 refills | Status: DC

## 2022-09-28 NOTE — Discharge Summary (Signed)
Physician Discharge Summary   Patient: Ronald [name] MRN: [id number] DOB: 31-Dec-1955  Admit date:     09/19/2022  Discharge date: 09/29/22  Discharge Physician: Hosie Poisson   PCP: Demetrios Isaacs, FNP   Recommendations at discharge:   Please follow up with PCP in one week.  Please check cbc and bmp in one week.   Discharge Diagnoses: Principal Problem:   Acute encephalopathy Active Problems:   Hyponatremia   ETOH abuse   COPD (chronic obstructive pulmonary disease) (HCC)   HTN (hypertension), benign   Chronic hypercapnic respiratory failure (HCC)  Resolved Problems:   * No resolved hospital problems. [facility] Course:  Ronald [name] is a 67 y.o. male with medical history significant of COPD, EtOH and cocaine abuse, L BKA. Pt is frequently hyponatremic as he drinks only Beer, no other EtOH. Pt with recent admission 12/21 for acute hypercapnic resp failure and EtOH withdrawal seizure.  Required intubation.  Able to come off of vent on 12/25.  Then spent another 11 days in hospital while medical team, family, all tried to convince him to go to ALF/SNF, patient refused and went home (lives with some roommates)Pysch saw him, decided he had decision making capacity.  APS also involved. During this admission, pt initially denied drinking since discharge, family disagrees noting open beer cans in room when they found him with AMS / generalized weakness. In the ED, pt was found to hyponatremia and admitted for further management.\ Patient is currently medically stable for discharge. Currently working for a safe discharge, TOC on board. After severe extensive discussions with family,as no SNF has offered a bed , we have maximized on [facility] services and she is scheduled to be discharged today. But family requested to be discharged in the afternoon, so they can arrange help and supervision at home.      Assessment and Plan:  Acute metabolic encephalopathy: Resolved.  Probably from  hyponatremia vs substance abuse.  -Flu, RSV, COVID are negative UDS is positive for cocaine, barbiturates. CT head is negative. Patient is alert oriented, able to answer all questions.       Hyponatremia PROBABLY secondary to alcohol intake , with a component of SIADH.  He is currently asymptomatic .      COPD (chronic obstructive pulmonary disease) (HCC) No wheezing heard on exam.  Continue with albuterol as needed, Breo Ellipta daily, . No change in medications.    ETOH abuse No signs of withdrawal.      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  Stable and is on RA.    HTN (hypertension), benign BP has been optimal. Resume lisinopril and amlodipine.        Anemia of chronic disease.  Hemoglobin improved from 9 to 10.Marland Kitchen Continue to monitor.     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 Advance diet as tolerated. On PPI.      Tachycardia Asymptomatic, resolved.    Therapy evaluations recommending SNF. Unfortunately in view of his payer barriers, SNF BED could not be obtained, maximized on [facility] resources and plan for discharge in the afternoon.    Pressure injury Present on admission.  RN Pressure Injury Documentation:     Pressure Injury 09/05/22 Elbow Left;Posterior Stage 2 -  Partial thickness loss of dermis presenting as a shallow open injury with a red, pink wound bed without slough. (Active)  09/05/22 0800  Location: Elbow  Location Orientation: Left;Posterior  Staging: Stage 2 -  Partial thickness  loss of dermis presenting as a shallow open injury with a red, pink wound bed without slough.  Wound Description (Comments):   Present on Admission:   Dressing Type Foam - Lift dressing to assess site every shift 09/26/22 2200     Pressure Injury Ischial tuberosity Left;Posterior;Proximal Stage 2 -  Partial thickness loss of dermis presenting as a shallow open injury with a red, pink wound bed without  slough. (Active)     Location: Ischial tuberosity  Location Orientation: Left;Posterior;Proximal  Staging: Stage 2 -  Partial thickness loss of dermis presenting as a shallow open injury with a red, pink wound bed without slough.  Wound Description (Comments):   Present on Admission:   Dressing Type Foam - Lift dressing to assess site every shift 09/26/22 2200    Foam dressing.       Consultants: none.  Procedures performed: none.   Disposition: Home Diet recommendation:  Regular diet DISCHARGE MEDICATION: Allergies as of 09/28/2022   No Known Allergies      Medication List     STOP taking these medications    Advil 200 MG tablet Generic drug: ibuprofen       TAKE these medications    amLODipine 10 MG tablet Commonly known as: NORVASC Take 1 tablet (10 mg total) by mouth daily.   Breo Ellipta 100-25 MCG/ACT Aepb Generic drug: fluticasone furoate-vilanterol Inhale 1 puff into the lungs daily.   folic acid 1 MG tablet Commonly known as: FOLVITE Take 1 tablet (1 mg total) by mouth daily.   guaiFENesin-dextromethorphan 100-10 MG/5ML syrup Commonly known as: ROBITUSSIN DM Take 10 mLs by mouth every 4 (four) hours as needed for cough.   lisinopril 10 MG tablet Commonly known as: ZESTRIL Take 1 tablet (10 mg total) by mouth daily.   multivitamin with minerals Tabs tablet Take 1 tablet by mouth daily. Start taking on: September 29, 2022   pantoprazole 40 MG tablet Commonly known as: PROTONIX Take 1 tablet (40 mg total) by mouth daily. Start taking on: September 29, 2022   thiamine 100 MG tablet Commonly known as: VITAMIN B1 Take 1 tablet (100 mg total) by mouth daily. What changed: when to take this   Ventolin HFA 108 (90 Base) MCG/ACT inhaler Generic drug: albuterol Inhale 2 puffs into the lungs every 6 (six) hours as needed for wheezing or shortness of breath.        Follow-up Information     [facility] Follow up.   Why: Home  Health services are arranged will follow up with you 24 to 72hrs after d/c. Contact information: [place] [address] [phone]                Discharge Exam: Danley Danker Weights   09/23/22 1600  Weight: 58.8 kg  General exam: Appears calm and comfortable  Respiratory system: Clear to auscultation. Respiratory effort normal. Cardiovascular system: S1 & S2 heard, RRR. No JVD,  Gastrointestinal system: Abdomen is nondistended, soft and nontender.  Central nervous system: Alert and oriented. No focal neurological deficits. Extremities: Symmetric 5 x 5 power. Skin: No rashes, lesions or ulcers Psychiatry: Mood & affect appropriate.    Condition at discharge: fair  The results of significant diagnostics from this hospitalization (including imaging, microbiology, ancillary and laboratory) are listed below for reference.   Imaging Studies: DG Swallowing Func-Speech Pathology  Result Date: 09/22/2022 Table formatting from the original result was not included. Objective Swallowing Evaluation: Type of Study: MBS-Modified Barium  [place] Study  Patient Details Name: Ronald [name] MRN: [id number] Date of Birth: 08/13/1956 Today's Date: 09/22/2022 Time: SLP Start Time (ACUTE ONLY): [id number] -SLP Stop Time (ACUTE ONLY): 0849 SLP Time Calculation (min) (ACUTE ONLY): 14 min Past Medical History: Past Medical History: Diagnosis Date  Critical lower limb ischemia ([place])   ETOH abuse   Gout   Hypertension  Past Surgical History: Past Surgical History: Procedure Laterality Date  ABDOMINAL AORTAGRAM N/A 12/26/2013  Procedure: ABDOMINAL Maxcine Ham;  Surgeon: Serafina Mitchell, MD;  Location: [facility] CATH LAB;  Service: Cardiovascular;  Laterality: N/A;  AMPUTATION Left 12/28/2013  Procedure: LEFT BELOW KNEE AMPUTATION;  Surgeon: Newt Minion, MD;  Location: [facility];  Service: Orthopedics;  Laterality: Left;  BELOW KNEE LEG AMPUTATION    Left  LEG SURGERY   HPI: Patient is a 67 y.o. male with PMH: COPD,  daily smoker, alcohol abuse, cocaine abuse left BKA who was recently admitted at [facility] 09/02/22-09/17/22 with dyspnea, increased WOB and wheezing, requiring intubation. He presented to [facility] ED on 09/19/22 after being found with AMS and generalized weakness by family. Patient denied ETOH use but family doubted this as they found open beer cans in his room. In ED patient was lethargic initially but this improved; SpO2 100% on 2L via [place], lungs with rales left lower lung field but no accessory muscle usage and not tachypneaic. He was admitted with acute encephalopathy. On 09/20/22, RN requesting SLP swallow evaluation due to patient coughing excessively, mainly during PO intake. Per evaluating SLP, concern for multifactorial dysphagia including pharyngeal and esophageal dysphagia.  Esophagram 05/2022 showed dysmotility and testing was limited due to pt's positioning.  Subjective: awake in chair  Recommendations for follow up therapy are one component of a multi-disciplinary discharge planning process, led by the attending physician.  Recommendations may be updated based on patient status, additional functional criteria and insurance authorization. Assessment / Plan / Recommendation   09/22/2022   9:28 AM Clinical Impressions Clinical Impression Pt presents with functional oropharyngeal swallow ability when tested with ultra thin, thin, nectar, pudding, cracker and tablet.  He demonstrated piecemealing with all consistencies and premature spillage.  Spillage to pyriform sinus with cracker noted.  Oral residuals spilled into pharynx and were cleared with reflexive swallow - sometimes delayed as boluses aggregated at vallecular space. Pt tends to take large boluses of liquids but was protective of his airway.  Pt had difficulty orally transiting tablet with thin - and required pudding to finally transit into pharynx.  Upon esophageal sweep, tablet appeared to halt at LES WITHOUT pt awareness.  Further boluses of thin transited tablet  into stomach.  Pt did  not cough or clear his throat during entire study. He did need encouragement to sit fully upright for testing - thus recommend continue said posture.   Given h/o esop dysmotility, will follow up x1 to assure tolerance and review dysmotility compensations.  Pt observed his MBS study live and was educated during testing. SLP Visit Diagnosis Dysphagia, unspecified (R13.10) Impact on safety and function Mild aspiration risk     09/22/2022   9:28 AM Treatment Recommendations Treatment Recommendations Therapy as outlined in treatment plan below     09/22/2022   9:36 AM Prognosis Prognosis for Safe Diet Advancement Good Barriers to Reach Goals Time post onset   09/22/2022   9:28 AM Diet Recommendations SLP Diet Recommendations Thin liquid;Regular solids Liquid Administration via Cup;Straw Medication Administration Other (Comment) Compensations Minimize environmental distractions;Slow rate;Small sips/bites Postural Changes Remain semi-upright after  after feeds/meals (Comment)     09/22/2022   9:28 AM Other Recommendations Oral Care Recommendations Oral care BID Follow Up Recommendations Other (comment) Functional Status Assessment Patient has had a recent decline in their functional status and demonstrates the ability to make significant improvements in function in a reasonable and predictable amount of time.   09/22/2022   9:28 AM Frequency and Duration  Speech Therapy Frequency (ACUTE ONLY) min 2x/week Treatment Duration 2 weeks     09/22/2022   9:28 AM Oral Phase Oral Phase [facility]    09/22/2022   9:28 AM Pharyngeal Phase Pharyngeal Phase [facility]    09/22/2022   9:28 AM Cervical Esophageal Phase  Cervical Esophageal Phase WFL Kathleen Lime, MS [facility] SLP Acute Rehab Services Office [phone] Pager [phone] Macario Golds 09/22/2022, 9:38 AM                     CT HEAD WO CONTRAST (5MM)  Result Date: 09/19/2022 CLINICAL DATA:  Generalized weakness, not eating or drinking EXAM: CT HEAD WITHOUT CONTRAST  TECHNIQUE: Contiguous axial images were obtained from the base of the skull through the vertex without intravenous contrast. RADIATION DOSE REDUCTION: This exam was performed according to the departmental dose-optimization program which includes automated exposure control, adjustment of the mA and/or kV according to patient size and/or use of iterative reconstruction technique. COMPARISON:  09/02/2022 FINDINGS: Brain: No evidence of acute infarction, hemorrhage, mass, mass effect, or midline shift. No hydrocephalus or extra-axial fluid collection. Mildly advanced cerebral atrophy for age. Redemonstrated remote lacunar infarct in the left basal ganglia. Periventricular white matter changes, likely the sequela of chronic small vessel ischemic disease. Vascular: No hyperdense vessel. Atherosclerotic calcifications in the intracranial carotid and vertebral arteries. Skull: Normal. Negative for fracture or focal lesion. Sinuses/Orbits: Overall clear paranasal sinuses. Remote right lamina papyracea defect. No acute finding in the orbits. Other: The mastoid air cells are well aerated. IMPRESSION: No acute intracranial process. Electronically Signed   By: Merilyn Baba M.D.   On: 09/19/2022 22:17   DG Chest Port 1 View  Result Date: 09/19/2022 CLINICAL DATA:  Shortness of breath.  Cough. EXAM: PORTABLE CHEST 1 VIEW COMPARISON:  None Available. FINDINGS: The heart size and mediastinal contours are within normal limits. Atherosclerotic calcification of the aortic arch. Both lungs are clear. The visualized skeletal structures are unremarkable. IMPRESSION: No active disease. Electronically Signed   By: Keane Police D.O.   On: 09/19/2022 17:08   DG Foot 2 Views Right  Result Date: 09/11/2022 CLINICAL DATA:  foot pain. EXAM: RIGHT FOOT - 2 VIEW COMPARISON:  Right foot radiographs 11/06/2013 FINDINGS: Moderate great toe metatarsophalangeal joint space narrowing, subchondral cystic changes, and medial peripheral  degenerative osteophytes are mildly worsened from prior. Chronic ossicle is again seen just proximal to the great toe metatarsal sesamoids. There is a new curvilinear lucency at the proximal metaphysis of the proximal phalanx of the second toe, likely an acute to subacute fracture. Increased spurring at the medial base of the proximal phalanx. Mild-to-moderate chronic enthesopathic changes at the Achilles insertion on the calcaneus. Mild to moderate dorsal talonavicular and tarsometatarsal degenerative osteophytes on lateral view. IMPRESSION: 1. Moderate great toe metatarsophalangeal joint osteoarthritis, mildly worsened from prior. 2. New acute to subacute nondisplaced fracture of the proximal metaphysis of the proximal phalanx of the second toe. Electronically Signed   By: Yvonne Kendall M.D.   On: 09/11/2022 20:02   DG CHEST PORT 1 VIEW  Result Date: 09/10/2022 CLINICAL  DATA:  Cough EXAM: PORTABLE CHEST 1 VIEW COMPARISON:  08/16/2022 FINDINGS: Interval removal of endotracheal and enteric tubes. Heart size is normal. Aortic atherosclerosis. Improved aeration of the lung bases with persistently coarsened interstitial markings. No new airspace consolidation. No pleural effusion or pneumothorax. IMPRESSION: Improved aeration of the lung bases with persistently coarsened interstitial markings. No new airspace consolidation. Electronically Signed   By: Davina Poke D.O.   On: 09/10/2022 14:53   DG Abd Portable 1V  Result Date: 09/08/2022 CLINICAL DATA:  Feeding tube placement. EXAM: PORTABLE ABDOMEN - 1 VIEW COMPARISON:  September 05, 2022. FINDINGS: Distal tip of feeding tube is seen in proximal stomach. IMPRESSION: Distal tip of feeding tube is seen in proximal stomach. Electronically Signed   By: Marijo Conception M.D.   On: 09/08/2022 14:34   EEG adult  Result Date: 09/07/2022 Lora Havens, MD     09/07/2022 12:33 PM Patient Name: Marguis Mathieson MRN: [id number] Epilepsy Attending: Lora Havens Referring Physician/Provider: Candee Furbish, MD Date: 09/07/2022 Duration: 21.44 mins Patient history: 67 year old male with seizure-like activity.  EEG to evaluate for seizure. Level of alertness: lethargic AEDs during EEG study: Phenobarb Technical aspects: This EEG study was done with scalp electrodes positioned according to the 10-20 International system of electrode placement. Electrical activity was reviewed with band pass filter of 1-70Hz , sensitivity of 7 uV/mm, display speed of 7mm/sec with a 60Hz  notched filter applied as appropriate. EEG data were recorded continuously and digitally stored.  Video monitoring was available and reviewed as appropriate. Description: EEG showed continuous generalized  3 to 6 Hz theta-delta slowing admixed with an excessive amount of 15 to 18 Hz beta activity distributed symmetrically and diffusely.  Hyperventilation and photic stimulation were not performed.   ABNORMALITY - Continuous slow, generalized - Excessive beta, generalized IMPRESSION: This study is suggestive of moderate to severe diffuse encephalopathy, nonspecific etiology. The excessive beta activity seen in the background is most likely due to the effect of medications like phenobarbital and is a benign EEG pattern. No seizures or epileptiform discharges were seen throughout the recording. Lora Havens   DG Abd Portable 1V  Result Date: 09/05/2022 CLINICAL DATA:  Encounter for OG tube placement EXAM: PORTABLE ABDOMEN - 1 VIEW COMPARISON:  09/05/2022 FINDINGS: The enteric tube tip and side port are below the GE junction within the gastric fundus. No dilated bowel loops identified. IMPRESSION: Enteric tube tip and side port are below the GE junction within the gastric fundus. Electronically Signed   By: Kerby Moors M.D.   On: 09/05/2022 10:47   DG CHEST PORT 1 VIEW  Result Date: 09/05/2022 CLINICAL DATA:  [id number] Aspiration into airway [id number] EXAM: PORTABLE CHEST 1 VIEW COMPARISON:   September 03, 2022 FINDINGS: The cardiomediastinal silhouette is unchanged in contour. Atherosclerotic calcifications. ETT tip terminates approximately 2.4 cm above the carina. Enteric tube tip and side port project over the proximal stomach. No pleural effusion. No pneumothorax. Bibasilar reticulonodular opacities, similar in comparison to prior. IMPRESSION: 1. Support apparatus as described above. 2. Similar appearance of bibasilar reticulonodular opacities. Electronically Signed   By: Valentino Saxon M.D.   On: 09/05/2022 10:45   DG Abd Portable 1V  Result Date: 09/05/2022 CLINICAL DATA:  [id number] Encounter for orogastric (OG) tube placement [id number] EXAM: PORTABLE ABDOMEN - 1 VIEW COMPARISON:  September 03, 2022 FINDINGS: Incomplete visualization of the pelvis. Enteric tube side port projects over the distal esophagus. No dilated loops of bowel are visualized. Bronchial wall  markings with bibasilar reticular nodularity. IMPRESSION: Enteric tube side port projects over the distal esophagus. Recommend advancement. Electronically Signed   By: Valentino Saxon M.D.   On: 09/05/2022 09:11   ECHOCARDIOGRAM COMPLETE  Result Date: 09/03/2022    ECHOCARDIOGRAM REPORT   Patient Name:   Daymien Mackowski Date of Exam: 09/03/2022 Medical Rec #:  [id number]       Height:       64.0 in Accession #:    [id number]      Weight:       114.0 lb Date of Birth:  1955-09-23       BSA:          1.541 m Patient Age:    65 years        BP:           101/63 mmHg Patient Gender: M               HR:           92 bpm. Exam Location:  Inpatient Procedure: 2D Echo, Color Doppler and Cardiac Doppler Indications:    [place] respiratory distress R06.03  History:        Patient has prior history of Echocardiogram examinations, most                 recent 03/19/2019. Arrythmias:RBBB, Signs/Symptoms:Hypotension and                 Dyspnea; Risk Factors:Hypertension.  Sonographer:    Greer Pickerel Referring Phys: [place]  Sonographer  Comments: Echo performed with patient supine and on artificial respirator. Image acquisition challenging due to COPD and Image acquisition challenging due to respiratory motion. IMPRESSIONS  1. Left ventricular ejection fraction, by estimation, is 65 to 70%. The left ventricle has normal function. The left ventricle has no regional wall motion abnormalities. There is mild left ventricular hypertrophy. Left ventricular diastolic parameters are consistent with Grade I diastolic dysfunction (impaired relaxation).  2. Right ventricular systolic function is normal. The right ventricular size is normal. There is normal pulmonary artery systolic pressure.  3. Trivial mitral valve regurgitation.  4. Aortic valve regurgitation is not visualized.  5. Aneurysm of the ascending aorta, measuring 41 mm.  6. The inferior vena cava is dilated in size with <50% respiratory variability, suggesting right atrial pressure of 15 mmHg. FINDINGS  Left Ventricle: Left ventricular ejection fraction, by estimation, is 65 to 70%. The left ventricle has normal function. The left ventricle has no regional wall motion abnormalities. The left ventricular internal cavity size was normal in size. There is  mild left ventricular hypertrophy. Left ventricular diastolic parameters are consistent with Grade I diastolic dysfunction (impaired relaxation). Right Ventricle: The right ventricular size is normal. Right ventricular systolic function is normal. There is normal pulmonary artery systolic pressure. The tricuspid regurgitant velocity is 1.15 m/s, and with an assumed right atrial pressure of 15 mmHg, the estimated right ventricular systolic pressure is 0000000 mmHg. Left Atrium: Left atrial size was normal in size. Right Atrium: Right atrial size was normal in size. Pericardium: There is no evidence of pericardial effusion. Mitral Valve: Trivial mitral valve regurgitation. Tricuspid Valve: Tricuspid valve regurgitation is trivial. Aortic Valve: Aortic  valve regurgitation is not visualized. Pulmonic Valve: Pulmonic valve regurgitation is trivial. Aorta: There is an aneurysm involving the ascending aorta measuring 41 mm. Venous: The inferior vena cava is dilated in size with less than 50% respiratory variability, suggesting right atrial pressure of 15  mmHg. IAS/Shunts: No atrial level shunt detected by color flow Doppler.  LEFT VENTRICLE PLAX 2D LVIDd:         3.70 cm Diastology LVIDs:         2.10 cm LV e' medial:    6.85 cm/s LV PW:         0.90 cm LV E/e' medial:  11.6 LV IVS:        1.30 cm LV e' lateral:   11.60 cm/s                        LV E/e' lateral: 6.9  RIGHT VENTRICLE RV S prime:     12.40 cm/s TAPSE (M-mode): 2.1 cm LEFT ATRIUM           Index        RIGHT ATRIUM           Index LA diam:      3.40 cm 2.21 cm/m   RA Area:     16.20 cm LA Vol (A2C): 12.0 ml 7.79 ml/m   RA Volume:   42.60 ml  27.65 ml/m LA Vol (A4C): 29.9 ml 19.41 ml/m  AORTIC VALVE             PULMONIC VALVE LVOT Vmax:   89.70 cm/s  PR End Diast Vel: 5.02 msec LVOT Vmean:  57.500 cm/s LVOT VTI:    0.153 m  AORTA Ao Root diam: 3.50 cm Ao Asc diam:  4.10 cm MITRAL VALVE               TRICUSPID VALVE MV Area (PHT): 4.71 cm    TR Peak grad:   5.3 mmHg MV Decel Time: 161 msec    TR Vmax:        115.00 cm/s MR Peak grad: 32.4 mmHg MR Vmax:      284.50 cm/s  SHUNTS MV E velocity: 79.80 cm/s  Systemic VTI: 0.15 m MV A velocity: 92.70 cm/s MV E/A ratio:  0.86 Mary Scientist, physiological signed by Phineas Inches Signature Date/Time: 09/03/2022/2:31:37 PM    Final    DG Chest Port 1 View  Result Date: 09/03/2022 CLINICAL DATA:  Respiratory failure EXAM: PORTABLE CHEST 1 VIEW COMPARISON:  Yesterday FINDINGS: Endotracheal tube with tip between the clavicular heads and carina. An enteric tube reaches the stomach. Pulmonary opacity with indistinct density at the right base. Nodular density over the right mid chest. These findings were recently evaluated by CT. No effusion or pneumothorax.  IMPRESSION: 1. Unremarkable hardware. 2. Stable right chest opacity as evaluated by CT yesterday. Electronically Signed   By: Jorje Guild M.D.   On: 09/03/2022 05:50   [place] EKG SITE RITE  Result Date: 09/03/2022 If [facility] image not attached, placement could not be confirmed due to current cardiac rhythm.  CT Angio Chest Pulmonary Embolism (PE) W or WO Contrast  Result Date: 09/02/2022 CLINICAL DATA:  Hypoxia EXAM: CT ANGIOGRAPHY CHEST WITH CONTRAST TECHNIQUE: Multidetector CT imaging of the chest was performed using the standard protocol during bolus administration of intravenous contrast. Multiplanar CT image reconstructions and MIPs were obtained to evaluate the vascular anatomy. RADIATION DOSE REDUCTION: This exam was performed according to the departmental dose-optimization program which includes automated exposure control, adjustment of the mA and/or kV according to patient size and/or use of iterative reconstruction technique. CONTRAST:  39mL OMNIPAQUE IOHEXOL 350 MG/ML SOLN COMPARISON:  Chest radiograph dated 09/02/2022 FINDINGS: Cardiovascular: The study is high quality for the  evaluation of pulmonary embolism. There are no filling defects in the central, lobar, segmental or subsegmental pulmonary artery branches to suggest acute pulmonary embolism. Great vessels are normal in course and caliber. Normal heart size. No significant pericardial fluid/thickening. Coronary artery calcifications and aortic atherosclerosis. Mediastinum/Nodes: Partially imaged thyroid gland without nodules meeting criteria for imaging follow-up by size. Normal esophagus. No pathologically enlarged axillary, supraclavicular, mediastinal, or hilar lymph nodes. Lungs/Pleura: ET tube terminates 1.9 cm above the carina. The central airways are patent. Mild upper lobe predominant centrilobular emphysema. Layering secretions within the bilateral bronchi extending into all lobes where there are subsegmental mucous plugs.  Diffuse bronchial wall thickening. Right upper lobe nodule measuring up to 8 mm (8:50). Multifocal bilateral lower lobe and lingular ground-glass nodules. Right lower lobe consolidation. No pneumothorax. No pleural effusion. Upper abdomen: Enteric tube terminates stomach. Calcified hepatic granulomas. Musculoskeletal: No acute or abnormal lytic or blastic osseous lesions. Review of the MIP images confirms the above findings. IMPRESSION: 1. No acute pulmonary embolism. 2. Right lower lobe pneumonia. 3. Layering secretions within the bilateral bronchi extending into all lobes where there are subsegmental mucous plugs. Multifocal bilateral lower lobe and lingular ground-glass nodules. Findings are suspicious for superimposed aspiration. 4. Right upper lobe nodule measuring up to 8 mm, likely infectious/inflammatory. Non-contrast chest CT at 6-12 months is recommended. If the nodule is stable at time of repeat CT, then future CT at 18-24 months (from today's scan) is considered optional for low-risk patients, but is recommended for high-risk patients. This recommendation follows the consensus statement: Guidelines for Management of Incidental Pulmonary Nodules Detected on CT Images: From the Fleischner Society 2017; Radiology 2017; 284:228-243. 5. Coronary artery calcifications. Aortic Atherosclerosis (ICD10-I70.0) and Emphysema (ICD10-J43.9). Electronically Signed   By: Darrin Nipper M.D.   On: 09/02/2022 20:14   CT HEAD WO CONTRAST (5MM)  Result Date: 09/02/2022 CLINICAL DATA:  Neuro deficit EXAM: CT HEAD WITHOUT CONTRAST TECHNIQUE: Contiguous axial images were obtained from the base of the skull through the vertex without intravenous contrast. RADIATION DOSE REDUCTION: This exam was performed according to the departmental dose-optimization program which includes automated exposure control, adjustment of the mA and/or kV according to patient size and/or use of iterative reconstruction technique. COMPARISON:  MRI  brain dated 03/18/2019 FINDINGS: Brain: No evidence of acute infarction, hemorrhage, hydrocephalus, extra-axial collection or mass lesion/mass effect. Mild cortical atrophy. Subcortical white matter and periventricular small vessel ischemic changes. Old left basal ganglia lacunar infarct. Vascular: Intracranial atherosclerosis. Skull: Normal. Negative for fracture or focal lesion. Sinuses/Orbits: The visualized paranasal sinuses are essentially clear. The mastoid air cells are unopacified. Other: None. IMPRESSION: No evidence of acute intracranial abnormality. Atrophy with small vessel ischemic changes. Old left basal ganglia lacunar infarct. Electronically Signed   By: Julian Hy M.D.   On: 09/02/2022 20:01   DG Chest Portable 1 View  Result Date: 09/02/2022 CLINICAL DATA:  Endotracheal tube and gastric tube position EXAM: PORTABLE CHEST 1 VIEW COMPARISON:  Chest 09/02/2022 FINDINGS: Endotracheal tube 3 cm above the carina. NG tip in the gastric fundus Lungs remain clear without infiltrate effusion or edema. IMPRESSION: Endotracheal tube 3 cm above the carina. NG tip in the gastric fundus. Lungs are clear. Electronically Signed   By: Franchot Gallo M.D.   On: 09/02/2022 16:32   DG Chest 2 View  Result Date: 09/02/2022 CLINICAL DATA:  Dyspnea EXAM: CHEST - 2 VIEW COMPARISON:  02/02/2022 FINDINGS: The heart size and mediastinal contours are within normal limits. Both lungs are  clear. The visualized skeletal structures are unremarkable. IMPRESSION: No active cardiopulmonary disease. Electronically Signed   By: Ernie Avena M.D.   On: 09/02/2022 12:58    Microbiology: Results for orders placed or performed during the hospital encounter of 09/19/22  Resp panel by RT-PCR (RSV, Flu A&B, Covid) Anterior Nasal Swab     Status: None   Collection Time: 09/19/22  5:40 PM   Specimen: Anterior Nasal Swab  Result Value Ref Range Status   SARS Coronavirus 2 by RT PCR NEGATIVE NEGATIVE Final     Comment: (NOTE) SARS-CoV-2 target nucleic acids are NOT DETECTED.  The SARS-CoV-2 RNA is generally detectable in upper respiratory specimens during the acute phase of infection. The lowest concentration of SARS-CoV-2 viral copies this assay can detect is 138 copies/mL. A negative result does not preclude SARS-Cov-2 infection and should not be used as the sole basis for treatment or other patient management decisions. A negative result may occur with  improper specimen collection/handling, submission of specimen other than nasopharyngeal swab, presence of viral mutation(s) within the areas targeted by this assay, and inadequate number of viral copies(<138 copies/mL). A negative result must be combined with clinical observations, patient history, and epidemiological information. The expected result is Negative.  Fact Sheet for Patients:  BloggerCourse.com  Fact Sheet for Healthcare Providers:  SeriousBroker.it  This test is no t yet approved or cleared by the [place] FDA and  has been authorized for detection and/or diagnosis of SARS-CoV-2 by FDA under an Emergency Use Authorization (EUA). This EUA will remain  in effect (meaning this test can be used) for the duration of the COVID-19 declaration under Section 564(b)(1) of the Act, 21 U.S.C.section 360bbb-3(b)(1), unless the authorization is terminated  or revoked sooner.       Influenza A by PCR NEGATIVE NEGATIVE Final   Influenza B by PCR NEGATIVE NEGATIVE Final    Comment: (NOTE) The Xpert Xpress SARS-CoV-2/FLU/RSV plus assay is intended as an aid in the diagnosis of influenza from Nasopharyngeal swab specimens and should not be used as a sole basis for treatment. Nasal washings and aspirates are unacceptable for Xpert Xpress SARS-CoV-2/FLU/RSV testing.  Fact Sheet for Patients: BloggerCourse.com  Fact Sheet for Healthcare  Providers: SeriousBroker.it  This test is not yet approved or cleared by the [place] FDA and has been authorized for detection and/or diagnosis of SARS-CoV-2 by FDA under an Emergency Use Authorization (EUA). This EUA will remain in effect (meaning this test can be used) for the duration of the COVID-19 declaration under Section 564(b)(1) of the Act, 21 U.S.C. section 360bbb-3(b)(1), unless the authorization is terminated or revoked.     Resp Syncytial Virus by PCR NEGATIVE NEGATIVE Final    Comment: (NOTE) Fact Sheet for Patients: BloggerCourse.com  Fact Sheet for Healthcare Providers: SeriousBroker.it  This test is not yet approved or cleared by the [place] FDA and has been authorized for detection and/or diagnosis of SARS-CoV-2 by FDA under an Emergency Use Authorization (EUA). This EUA will remain in effect (meaning this test can be used) for the duration of the COVID-19 declaration under Section 564(b)(1) of the Act, 21 U.S.C. section 360bbb-3(b)(1), unless the authorization is terminated or revoked.  Performed at [facility], [address] [place], [place] [address]     Labs: CBC: Recent Labs  Lab 09/22/22 0406 09/27/22 0423  WBC 8.5 8.0  NEUTROABS  --  5.1  HGB 9.3* 10.0*  HCT 28.5* 30.6*  MCV 94.4 94.7  PLT  601* 123456   Basic Metabolic Panel: Recent Labs  Lab 09/22/22 0406 09/27/22 0423  NA 132* 131*  K 3.6 4.1  CL 99 96*  CO2 27 28  GLUCOSE 84 95  BUN 5* 13  CREATININE 0.43* 0.61  CALCIUM 8.4* 8.9   Liver Function Tests: No results for input(s): "AST", "ALT", "ALKPHOS", "BILITOT", "PROT", "ALBUMIN" in the last 168 hours. CBG: No results for input(s): "GLUCAP" in the last 168 hours.  Discharge time spent: 43 minutes.   Signed: Hosie Poisson, MD [facility] 09/28/2022

## 2022-09-28 NOTE — TOC Progression Note (Signed)
Transition of Care [facility]) - Progression Note    Patient Details  Name: [name] MRN: [id number] Date of Birth: [birth year]  Transition of Care [facility]) CM/SW Contact  Joaquin Courts, RN Phone Number: 09/28/2022, 2:30 PM  Clinical Narrative:    [facility] has declined.        Expected Discharge Plan and Services                                               Social Determinants of Health (SDOH) Interventions SDOH Screenings   Food Insecurity: No Food Insecurity (09/20/2022)  Recent Concern: Food Insecurity - Food Insecurity Present (09/17/2022)  Housing: Medium Risk (09/20/2022)  Transportation Needs: Unmet Transportation Needs (09/20/2022)  Utilities: Not At Risk (09/20/2022)  Tobacco Use: High Risk (09/23/2022)    Readmission Risk Interventions     No data to display

## 2022-09-28 NOTE — TOC Progression Note (Addendum)
Transition of Care [facility]) - Progression Note    Patient Details  Name: Ronald [name] MRN: [id number] Date of Birth: 02/09/1956  Transition of Care [facility]) CM/SW [place], LCSW Phone Number: 09/28/2022, 1:02 PM  Clinical Narrative:    CSW spoke with pt about his discharge plan. Pt reported he is not able to return to his home. Pt stated his home is padlocked and he needs 24-hour care. Pt reported his sister went to work and could not be reached until after 5 pm. CSW informed pt he is coming up for discharge and will need to make a plan. Pt became upset and replied, "I don't know ".  CSW called pt's sister Ronald [name] to discuss pt's discharge. Ronald [name] put the phone on speaker where pt two other sisters can hear the whole conversation. CSW explained to pt's family, that Ronald [name] is coming up for discharge and wanted to discuss [place]. Pt's sister started to get agitated and all were yelling over the phone. Pt's sister Ronald [name] requested not to be called again concerning Pt. Ronald [name] stated "he does not have anywhere to go" You all said he needs 24-hour care, and his home is padded locked."  Pt family never said why the home is padlocked. Pt's family stated their concerns about pt's APS worker not responding and how the hospital will not help with placement. CSW explained to pt's family they would need to contact Social Services to speak with an APS worker.  CSW explained pt does not have insurance coverage for short-term rehab, and that the pt will need to contact Medicare to understand why he does not have Part A. Pt's family reported he doesn't need short-term term he needs Long-term care. CSW explained the LTC process. CSW inquired if pt or his family have been looking for placement in the community. The sisters stated they have spoken with [facility] and they haven't heard back yet. pt's family stated pt can not go to a shelter and pt cannot live with any of them. Pt's family  requested a few hours to figure out a plan for pt.   1:30pm  Pt's sister Ronald [name] called back and stated she spoke with Ronald [name] from [facility] about placement and was told they are still reviewing pt. pt's sister requested time for [facility]'s DON to review. CSW informed pt's family the request for [facility] services has been sent out, and pt will need to be in the home for them to asses. TOC will inform leadership about the conversation.   2:00pm  CSW spoke with Ronald [name] from [facility] , she reported the DON has declined pt. CSW contacted pt's family to inform them of [facility]'s decision. Pt;s family stated they spoke with someone at [facility] , who reported they will have to get insurance authorization before pt can come. CSW reminded pt's family pt does not have the coverage for short term rehab. Pt's family reported they are on their way to the hospital to speak with administration. TOC to follow.  ADDEN  3:15pm  CSW spoke to Ronald [name] with [facility] pt was accepted for HHPT/OT/SW/aide services. TOC to follow.   3:30pm CSW reached out to [place] with [facility], she reported not offering pt's a bed at the facility. Ronald [name] reported she is willing to review pt for LTC. CSW sent pt's information out in hub for review. TOC to follow.   3:50pm CSW spoke with pt's and pt's family at bedside to inform  them that [facility] declined pt as well. CSW also informed pt and family that HHPT/OT/aide/SW services were arranged. Pt's family was visibly upset and reported not wanting to speak with CSW anymore. They requested to speak with the floor director. CSW informed the floor, Director. TOC to follow.         Expected Discharge Plan and Services                                               Social Determinants of Health (SDOH) Interventions SDOH Screenings   Food Insecurity: No Food Insecurity (09/20/2022)  Recent Concern: Food Insecurity - Food  Insecurity Present (09/17/2022)  Housing: Medium Risk (09/20/2022)  Transportation Needs: Unmet Transportation Needs (09/20/2022)  Utilities: Not At Risk (09/20/2022)  Tobacco Use: High Risk (09/23/2022)    Readmission Risk Interventions     No data to display

## 2022-09-28 NOTE — TOC Progression Note (Signed)
Transition of Care [facility]) - Progression Note    Patient Details  Name: Ronald [name] MRN: [id number] Date of Birth: [birth year]  Transition of Care [facility]) CM/SW Contact  Joaquin Courts, RN Phone Number: 09/28/2022, 11:40 AM  Clinical Narrative:    CM spoke with [facility] admissions, who reports DON is reviewing the clinical information provided but has not yet made a determination.        Expected Discharge Plan and Services                                               Social Determinants of Health (SDOH) Interventions SDOH Screenings   Food Insecurity: No Food Insecurity (09/20/2022)  Recent Concern: Food Insecurity - Food Insecurity Present (09/17/2022)  Housing: Medium Risk (09/20/2022)  Transportation Needs: Unmet Transportation Needs (09/20/2022)  Utilities: Not At Risk (09/20/2022)  Tobacco Use: High Risk (09/23/2022)    Readmission Risk Interventions     No data to display

## 2022-09-29 NOTE — Plan of Care (Signed)

## 2022-09-29 NOTE — Progress Notes (Signed)
No charge note:  patient seen and examined, no acute overnight event, discharge summary and orders were placed by Dr. Karleen Hampshire.  Transitional care working on disposition.

## 2022-09-29 NOTE — TOC Progression Note (Addendum)
Transition of Care [facility]) - Progression Note    Patient Details  Name: Ronald [name] MRN: [id number] Date of Birth: [birth year]  Transition of Care [facility]) CM/SW [place], LCSW Phone Number: 09/29/2022, 9:28 AM  Clinical Narrative:     CSW received an email from [name] with Social Services. Requesting pt to d/c to a SNF . CSW attempted to contact social worker at [phone], as well as [phone]) left HIPAA compliant VM, requesting a return call.    ADDEN 11:08 CSW received a message from pt's nurse reported pt's sister Ronald [name] would like pt's FL2 sent over to [facility], CSW emailed Herbalist at [facility] pt's information. CSW called pt's sister Ronald [name] and informed her that pt's information had been sent over. CSW also informed pt's sister pt's is up for discharge and that the hospital can arrange for transportation, as it was requested before. Pt's sister requested CSW name and disconnected the call. TOC to follow.   Expected Discharge Plan and Services         Expected Discharge Date: 09/29/22                                     Social Determinants of Health (SDOH) Interventions SDOH Screenings   Food Insecurity: No Food Insecurity (09/20/2022)  Recent Concern: Food Insecurity - Food Insecurity Present (09/17/2022)  Housing: Medium Risk (09/20/2022)  Transportation Needs: Unmet Transportation Needs (09/20/2022)  Utilities: Not At Risk (09/20/2022)  Tobacco Use: High Risk (09/23/2022)    Readmission Risk Interventions     No data to display

## 2022-09-29 NOTE — TOC Progression Note (Signed)
Transition of Care [facility]) - Progression Note    Patient Details  Name: Ronald [name] MRN: [id number] Date of Birth: 30-Nov-1955  Transition of Care [facility]) CM/SW Contact  Joaquin Courts, RN Phone Number: 09/29/2022, 4:30 PM  Clinical Narrative:    Welfare check complete- per [employer] no one at the residence and mail box is full. TOC attempting to reach sisters at this time.        Expected Discharge Plan and Services         Expected Discharge Date: 09/29/22                                     Social Determinants of Health (SDOH) Interventions SDOH Screenings   Food Insecurity: No Food Insecurity (09/20/2022)  Recent Concern: Food Insecurity - Food Insecurity Present (09/17/2022)  Housing: Medium Risk (09/20/2022)  Transportation Needs: Unmet Transportation Needs (09/20/2022)  Utilities: Not At Risk (09/20/2022)  Tobacco Use: High Risk (09/23/2022)    Readmission Risk Interventions     No data to display

## 2022-09-29 NOTE — TOC Progression Note (Signed)
Transition of Care [facility]) - Progression Note    Patient Details  Name: Ronald [name] MRN: [id number] Date of Birth: [birth year]  Transition of Care [facility]) CM/SW Contact  Joaquin Courts, RN Phone Number: 09/29/2022, 3:56 PM  Clinical Narrative:    CM met with patient at bedside to discuss return home today via [place].  Patient lound and belligerent, yelling at [facility] that he does not have keys or anyone there.  CM asked follow up question around roommate who his sisters previously mentioned resides in the home, patient reports he comes and goes, reports other come and go as well.  CM asked if patient wants these people in his home, he states he is the one who lets them in, states he wants company.  CM asked if any of these individuals have a key, patient yelled how is he supposed to know.  Patient seems to indicate that individuals are present in the home, and also that his sisters have keys, but is unable to provide any names or contacts.  CM requested a welfare check on the address on file to verify if childhood friend/ other individuals are on premises. Awaiting outcome.         Expected Discharge Plan and Services         Expected Discharge Date: 09/29/22                                     Social Determinants of Health (SDOH) Interventions SDOH Screenings   Food Insecurity: No Food Insecurity (09/20/2022)  Recent Concern: Food Insecurity - Food Insecurity Present (09/17/2022)  Housing: Medium Risk (09/20/2022)  Transportation Needs: Unmet Transportation Needs (09/20/2022)  Utilities: Not At Risk (09/20/2022)  Tobacco Use: High Risk (09/23/2022)    Readmission Risk Interventions     No data to display

## 2022-09-29 NOTE — TOC Progression Note (Signed)
Transition of Care [facility]) - Progression Note    Patient Details  Name: Ronald [name]: [id number] Date of Birth: 1956-06-30  Transition of Care [facility]) CM/SW Contact  Joaquin Courts, RN Phone Number: 09/29/2022, 3:36 PM  Clinical Narrative:    CM met with patients sisters and unit director and discussed barriers to placement, as well as [facility] services that have been arranged and request for expedited PCS services that has been submitted.  Sisters state that they are in the process of trying to get roommate evicted from family home and feel this is not the best place for patient to be until this happens.  CM discussed the possibility of patient staying with sisters until this occurs, both sisters report this is not an option.  Discussed staying at a hotel as an alternative, sisters are open to this idea but not clear on how to fund this.  Report patient receives disability/ SSI.  CM outreached to DSS program manager and requested possibility of DSS assist with funding this, program manager reports unclear if this is possible but will explore this option and will contact family.  CM followed up with DSS program manager after several hours and was informed that sisters are not responding to calls from [place].  CM also attempted to call and could not reach sisters.  After further conversation with DSS, plan is for patient to return to his home with [facility] services and APS will continue to follow in the community.         Expected Discharge Plan and Services         Expected Discharge Date: 09/29/22                                     Social Determinants of Health (SDOH) Interventions SDOH Screenings   Food Insecurity: No Food Insecurity (09/20/2022)  Recent Concern: Food Insecurity - Food Insecurity Present (09/17/2022)  Housing: Medium Risk (09/20/2022)  Transportation Needs: Unmet Transportation Needs (09/20/2022)  Utilities: Not At Risk (09/20/2022)  Tobacco Use: High Risk (09/23/2022)     Readmission Risk Interventions     No data to display

## 2022-09-29 NOTE — Progress Notes (Signed)
Occupational Therapy Treatment Patient Details Name: Hamp Moreland MRN: [id number] DOB: 16-Mar-1956 Today's Date: 09/29/2022   History of present illness Pt is a 67yo male presenting to [facility] ED on 09/19/22 with AMS and generalized weakness, found by family with open beer canes in room. Recent hospitalization 09/02/22-09/17/22 with dyspnea, increased WOB, wheezing, required intubation. PMH: gout, polysubstance abuse, HTN, L transtibial amputation, COPD.   OT comments  Patient sat edge of bed for grooming and feeding tasks with no apparent balance deficits at edge of bed. Patient independent with bed mobility. Reports constipation and no current need to transfer to [facility]. Will continue to follow.    Recommendations for follow up therapy are one component of a multi-disciplinary discharge planning process, led by the attending physician.  Recommendations may be updated based on patient status, additional functional criteria and insurance authorization.    Follow Up Recommendations  Skilled nursing-short term rehab (<3 hours/day)     Assistance Recommended at Discharge Frequent or constant Supervision/Assistance  Patient can return home with the following  A little help with walking and/or transfers;A little help with bathing/dressing/bathroom;Assistance with cooking/housework;Direct supervision/assist for financial management;Direct supervision/assist for medications management;Help with stairs or ramp for entrance   Equipment Recommendations  None recommended by OT    Recommendations for Other Services      Precautions / Restrictions Precautions Precautions: Fall Precaution Comments: 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) Restrictions Weight Bearing Restrictions: Yes RLE Weight Bearing: Weight bearing as tolerated Other Position/Activity Restrictions: in hard sole shoe per ortho (09/14/2022)       Mobility Bed Mobility Overal bed mobility: Modified Independent                   Transfers                         Balance Overall balance assessment: Needs assistance Sitting-balance support: No upper extremity supported, Feet supported Sitting balance-Leahy Scale: Fair                                     ADL either performed or assessed with clinical judgement   ADL Overall ADL's : Needs assistance/impaired Eating/Feeding: Independent Eating/Feeding Details (indicate cue type and reason): at edge of bed - open his drink and filled his cup. Put condiments on his sandwhich. Grooming: Wash/dry hands;Wash/dry face;Sitting;Set up Grooming Details (indicate cue type and reason): washed his hands and face at edge of bed.                             Functional mobility during ADLs: Supervision/safety General ADL Comments: sat edge of bed 6 minutes with therapist.    Extremity/Trunk Assessment Upper Extremity Assessment Upper Extremity Assessment: Overall WFL for tasks assessed (continues to demonstrate 5/5 UE strength, tremors noted bilaterally)       Cervical / Trunk Assessment Cervical / Trunk Assessment: Normal    Vision   Vision Assessment?: No apparent visual deficits   Perception     Praxis      Cognition Arousal/Alertness: Awake/alert Behavior During Therapy: WFL for tasks assessed/performed Overall Cognitive Status: Within Functional Limits for tasks assessed                                 General  Comments: Forgetful. Asking therapist if he had lunch. per nursing - yesterday he was abl to order all his meals. Today it appears he forgot to order lunch.        Exercises      Shoulder Instructions       General Comments      Pertinent Vitals/ Pain       Pain Assessment Pain Assessment: No/denies pain  Home Living                                          Prior Functioning/Environment              Frequency  Min 2X/week        Progress Toward  Goals  OT Goals(current goals can now be found in the care plan section)  Progress towards OT goals: Progressing toward goals  Acute Rehab OT Goals Patient Stated Goal: none stated OT Goal Formulation: With patient Time For Goal Achievement: 10/06/22 Potential to Achieve Goals: Good  Plan Discharge plan remains appropriate    Co-evaluation          OT goals addressed during session: ADL's and self-care      AM-PAC OT "6 Clicks" Daily Activity     Outcome Measure   Help from another person eating meals?: None Help from another person taking care of personal grooming?: A Little Help from another person toileting, which includes using toliet, bedpan, or urinal?: A Little Help from another person bathing (including washing, rinsing, drying)?: A Little Help from another person to put on and taking off regular upper body clothing?: A Little Help from another person to put on and taking off regular lower body clothing?: A Little 6 Click Score: 19    End of Session    OT Visit Diagnosis: Unsteadiness on feet (R26.81);Other abnormalities of gait and mobility (R26.89);Muscle weakness (generalized) (M62.81)   Activity Tolerance Patient tolerated treatment well   Patient Left with call bell/phone within reach (edge of bed - eating)   Nurse Communication Mobility status        Time: 2585-2778 OT Time Calculation (min): 14 min  Charges: OT General Charges $OT Visit: 1 Visit OT Treatments $Self Care/Home Management : 8-22 mins  Gustavo Lah, OTR/L [place]  Office [phone]   Lenward Chancellor 09/29/2022, 2:49 PM

## 2022-09-29 NOTE — TOC Progression Note (Signed)
Transition of Care [facility]) - Progression Note    Patient Details  Name: Ronald [name] MRN: [id number] Date of Birth: 08-Feb-1956  [facility]) CM/SW Contact  Joaquin Courts, RN Phone Number: 09/29/2022, 10:03 AM  Clinical Narrative:    CM called [facility] CSW and left HIPAA compliant VM requesting call back, additionally CM spoke with Program director Kevin Fenton and provided information, awaiting his call back after he staffs the case with CSW.        Expected Discharge Plan and Services         Expected Discharge Date: 09/29/22                                     Social Determinants of Health (SDOH) Interventions SDOH Screenings   Food Insecurity: No Food Insecurity (09/20/2022)  Recent Concern: Food Insecurity - Food Insecurity Present (09/17/2022)  Housing: Medium Risk (09/20/2022)  Transportation Needs: Unmet Transportation Needs (09/20/2022)  Utilities: Not At Risk (09/20/2022)  Tobacco Use: High Risk (09/23/2022)    Readmission Risk Interventions     No data to display

## 2022-09-30 NOTE — Progress Notes (Signed)
Patient requested that I call his sisters and notify them that he is going home. Called all three sisters and no answer; Left voicemail for cynthia to call back.

## 2022-09-30 NOTE — Progress Notes (Signed)
No charge note:  patient seen and examined, no acute overnight event, discharge summary and orders were placed by Dr. Karleen Hampshire. disposition per transitional care.

## 2022-09-30 NOTE — TOC Progression Note (Signed)
Transition of Care [facility]) - Progression Note    Patient Details  Name: Ronald [name] MRN: [id number] Date of Birth: 1956-01-05  Transition of Care [facility]) CM/SW Contact  Ronald Courts, RN Phone Number: 09/30/2022, 9:39 AM  Clinical Narrative:    CM received call back from sister Ronald [name], who reports she will defer all decision making to the patient who is able to make his own decisions.  Ronald [name] did advise that she does not have a key to the home, but does know that there is a room mate in the home that could open the door for patient if room mate is home at time of dc, however Ronald [name] could not provide a name or phone number for this individual.  CM did ask if family has given any consideration to a hotel stay for the patient and if so could this be funded from his monthly income, per Ronald [name] she is not aware of patients income but does know that her niece helps patient with paying bills and managing his finances.  Ronald [name] did agree to call her sister and inquire if she has a key.  CM did explain that the next option available to patient is to go to the [facility] which is operating 24/7 at this time.  Ronald [name] states patient can make that determination for himself.  CM will follow up with patient.        Expected Discharge Plan and Services         Expected Discharge Date: 09/29/22                                     Social Determinants of Health (SDOH) Interventions SDOH Screenings   Food Insecurity: No Food Insecurity (09/20/2022)  Recent Concern: Food Insecurity - Food Insecurity Present (09/17/2022)  Housing: Medium Risk (09/20/2022)  Transportation Needs: Unmet Transportation Needs (09/20/2022)  Utilities: Not At Risk (09/20/2022)  Tobacco Use: High Risk (09/23/2022)    Readmission Risk Interventions     No data to display

## 2022-09-30 NOTE — Progress Notes (Signed)
Physical Therapy Treatment Patient Details Name: Ronald [name] MRN: [id number] DOB: January 05, 1956 Today's Date: 09/30/2022   History of Present Illness Pt is a 67yo male presenting to [facility] ED on 09/19/22 with AMS and generalized weakness, found by family with open beer canes in room. Recent hospitalization 09/02/22-09/17/22 with dyspnea, increased WOB, wheezing, required intubation. PMH: gout, polysubstance abuse, HTN, L transtibial amputation, COPD.    PT Comments    Patient easily agitated at start of session but eventually agreeable to attempt mobility for bed<>wheelchair transfer with encouragement from PT and RN. Supervision/min guard for squat pivot transfer required with set up assist to position wheelchair and manage brakes. Patient wishes to return home with wheelchair. Recommend HHPT.   Recommendations for follow up therapy are one component of a multi-disciplinary discharge planning process, led by the attending physician.  Recommendations may be updated based on patient status, additional functional criteria and insurance authorization.  Follow Up Recommendations  Home health PT Can patient physically be transported by private vehicle: No   Assistance Recommended at Discharge Frequent or constant Supervision/Assistance  Patient can return home with the following Assistance with cooking/housework;Direct supervision/assist for financial management;Direct supervision/assist for medications management;Assist for transportation;A little help with walking and/or transfers;A little help with bathing/dressing/bathroom   Equipment Recommendations  Wheelchair (measurements PT);Wheelchair cushion (measurements PT)    Recommendations for Other Services       Precautions / Restrictions Precautions Precautions: Fall Precaution Comments: 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 pt required set up assist of wheelchair    Ambulation/Gait                   Stairs             Wheelchair Mobility    Modified Rankin (Stroke Patients Only)       Balance Overall balance assessment: Needs assistance Sitting-balance support: No upper extremity supported, Feet supported Sitting balance-Leahy Scale: Fair Sitting balance - Comments: Sitting EOB to complete UB strengthening exercises.                                    Cognition Arousal/Alertness: Awake/alert Behavior During Therapy: Impulsive, Agitated Overall Cognitive Status: No family/caregiver present to determine baseline cognitive functioning Area of Impairment: Following commands, Safety/judgement, Problem solving                     Memory: Decreased recall of precautions Following Commands: Follows one step commands inconsistently, Follows multi-step commands inconsistently Safety/Judgement: Decreased awareness of safety, Decreased awareness of deficits Awareness: Emergent Problem Solving: Difficulty sequencing, Requires verbal cues          Exercises      General Comments        Pertinent Vitals/Pain Pain Assessment Pain Assessment: Faces Faces Pain Scale: Hurts a little bit Pain Descriptors / Indicators: Discomfort Pain  Intervention(s): Monitored during session    Home Living                          Prior Function            PT Goals (current goals  can now be found in the care plan section) Acute Rehab PT Goals Patient Stated Goal: To go home PT Goal Formulation: With patient Time For Goal Achievement: 10/06/22 Potential to Achieve Goals: Fair Progress towards PT goals: Progressing toward goals    Frequency    Min 3X/week      PT Plan Current plan remains appropriate    Co-evaluation              AM-PAC PT "6 Clicks" Mobility   Outcome Measure  Help needed turning from your back to your side while in a flat bed without using bedrails?: None Help needed moving from lying on your back to sitting on the side of a flat bed without using bedrails?: None Help needed moving to and from a bed to a chair (including a wheelchair)?: A Little Help needed standing up from a chair using your arms (e.g., wheelchair or bedside chair)?: Total Help needed to walk in hospital room?: Total Help needed climbing 3-5 steps with a railing? : Total 6 Click Score: 14    End of Session   Activity Tolerance: Treatment limited secondary to agitation (pt yelling and irritable) Patient left: in bed;with call bell/phone within reach;with bed alarm set Nurse Communication: Mobility status PT Visit Diagnosis: Other abnormalities of gait and mobility (R26.89);Muscle weakness (generalized) (M62.81);History of falling (Z91.81)     Time: 1250-1306 PT Time Calculation (min) (ACUTE ONLY): 16 min  Charges:  $Therapeutic Activity: 8-22 mins                     Verner Mould, DPT Acute Rehabilitation Services Office [phone]  09/30/22 3:45 PM

## 2022-09-30 NOTE — TOC Progression Note (Signed)
Transition of Care [facility]) - Progression Note    Patient Details  Name: [name] MRN: [id number] Date of Birth: [birth year]  Transition of Care [facility]) CM/SW Contact  Joaquin Courts, RN Phone Number: 09/30/2022, 9:54 AM  Clinical Narrative:    CM met with patient at bedside and discussed discharge options of returning home vs shelter vs [facility].  Patient states he has a home and does not need to go somewhere else.  CM asked if patient would like to return to him home at the [address] address in [place] and patient stated yes.  CM requests MD order for wheelchair as patient reports he does not have a wheelchair.  Plan to transport by wheelchair Lucianne Lei to home address once wheelchair is delivered to bedside.        Expected Discharge Plan and Services         Expected Discharge Date: 09/29/22                                     Social Determinants of Health (SDOH) Interventions SDOH Screenings   Food Insecurity: No Food Insecurity (09/20/2022)  Recent Concern: Food Insecurity - Food Insecurity Present (09/17/2022)  Housing: Medium Risk (09/20/2022)  Transportation Needs: Unmet Transportation Needs (09/20/2022)  Utilities: Not At Risk (09/20/2022)  Tobacco Use: High Risk (09/23/2022)    Readmission Risk Interventions     No data to display

## 2022-09-30 NOTE — TOC Progression Note (Signed)
Transition of Care [facility]) - Progression Note    Patient Details  Name: [name] MRN: [id number] Date of Birth: 07-13-1956  Transition of Care [facility]) CM/SW Contact  Joaquin Courts, RN Phone Number: 09/30/2022, 10:31 AM  Clinical Narrative:    CM made referral for wheelchair to [employer], agency to deliver equipment to bedside.        Expected Discharge Plan and Services         Expected Discharge Date: 09/29/22                                     Social Determinants of Health (SDOH) Interventions SDOH Screenings   Food Insecurity: No Food Insecurity (09/20/2022)  Recent Concern: Food Insecurity - Food Insecurity Present (09/17/2022)  Housing: Medium Risk (09/20/2022)  Transportation Needs: Unmet Transportation Needs (09/20/2022)  Utilities: Not At Risk (09/20/2022)  Tobacco Use: High Risk (09/23/2022)    Readmission Risk Interventions     No data to display

## 2022-09-30 NOTE — TOC Transition Note (Signed)
Transition of Care [facility]) - CM/SW Discharge Note   Patient Details  Name: [name] MRN: [id number] Date of Birth: 1956-01-07  Transition of Care [facility]) CM/SW Contact:  Joaquin Courts, RN Phone Number: 09/30/2022, 12:59 PM   Clinical Narrative:     Wheelchair has been delivered to bedside.  Safe transport arranged.  Bedside RN notified.        Patient Goals and CMS Choice      Discharge Placement                         Discharge Plan and Services Additional resources added to the After Visit Summary for                                       Social Determinants of Health (SDOH) Interventions SDOH Screenings   Food Insecurity: No Food Insecurity (09/20/2022)  Recent Concern: [place] Present (09/17/2022)  Housing: Medium Risk (09/20/2022)  Transportation Needs: Unmet Transportation Needs (09/20/2022)  Utilities: Not At Risk (09/20/2022)  Tobacco Use: High Risk (09/23/2022)     Readmission Risk Interventions     No data to display

## 2022-09-30 NOTE — TOC Progression Note (Signed)
Transition of Care [facility]) - Progression Note    Patient Details  Name: [name] MRN: [id number] Date of Birth: 10/12/1955  Transition of Care [facility]) CM/SW Contact  Joaquin Courts, RN Phone Number: 09/30/2022, 8:47 AM  Clinical Narrative:    CM called all contacts listed in chart, HIPAA compliant VM left. TOC unable to reach any family members at this time.        Expected Discharge Plan and Services         Expected Discharge Date: 09/29/22                                     Social Determinants of Health (SDOH) Interventions SDOH Screenings   Food Insecurity: No Food Insecurity (09/20/2022)  Recent Concern: Food Insecurity - Food Insecurity Present (09/17/2022)  Housing: Medium Risk (09/20/2022)  Transportation Needs: Unmet Transportation Needs (09/20/2022)  Utilities: Not At Risk (09/20/2022)  Tobacco Use: High Risk (09/23/2022)    Readmission Risk Interventions     No data to display

## 2022-10-10 ENCOUNTER — Other Ambulatory Visit: Payer: Self-pay

## 2022-10-11 ENCOUNTER — Other Ambulatory Visit: Payer: Self-pay

## 2022-10-15 ENCOUNTER — Other Ambulatory Visit: Payer: Self-pay

## 2022-10-15 ENCOUNTER — Encounter (HOSPITAL_COMMUNITY): Payer: Self-pay

## 2022-10-15 ENCOUNTER — Inpatient Hospital Stay (HOSPITAL_COMMUNITY)
Admission: EM | Admit: 2022-10-15 | Discharge: 2022-12-03 | DRG: 981 | Disposition: A | Discharge status: Skilled Nursing Facility | Payer: Medicare Other | Attending: Internal Medicine | Admitting: Internal Medicine

## 2022-10-15 ENCOUNTER — Emergency Department (HOSPITAL_COMMUNITY): Payer: Medicare Other

## 2022-10-15 ENCOUNTER — Inpatient Hospital Stay (HOSPITAL_COMMUNITY): Payer: Medicare Other

## 2022-10-15 DIAGNOSIS — Z1152 Encounter for screening for COVID-19: Secondary | ICD-10-CM | POA: Diagnosis not present

## 2022-10-15 DIAGNOSIS — Z89512 Acquired absence of left leg below knee: Secondary | ICD-10-CM | POA: Diagnosis not present

## 2022-10-15 DIAGNOSIS — M86171 Other acute osteomyelitis, right ankle and foot: Secondary | ICD-10-CM | POA: Diagnosis not present

## 2022-10-15 DIAGNOSIS — Z91199 Patient's noncompliance with other medical treatment and regimen due to unspecified reason: Secondary | ICD-10-CM

## 2022-10-15 DIAGNOSIS — I10 Essential (primary) hypertension: Secondary | ICD-10-CM | POA: Diagnosis present

## 2022-10-15 DIAGNOSIS — Z720 Tobacco use: Secondary | ICD-10-CM

## 2022-10-15 DIAGNOSIS — J441 Chronic obstructive pulmonary disease with (acute) exacerbation: Secondary | ICD-10-CM | POA: Diagnosis not present

## 2022-10-15 DIAGNOSIS — G9341 Metabolic encephalopathy: Secondary | ICD-10-CM | POA: Diagnosis present

## 2022-10-15 DIAGNOSIS — E1152 Type 2 diabetes mellitus with diabetic peripheral angiopathy with gangrene: Secondary | ICD-10-CM | POA: Diagnosis present

## 2022-10-15 DIAGNOSIS — J440 Chronic obstructive pulmonary disease with acute lower respiratory infection: Secondary | ICD-10-CM | POA: Diagnosis present

## 2022-10-15 DIAGNOSIS — F1010 Alcohol abuse, uncomplicated: Secondary | ICD-10-CM | POA: Diagnosis not present

## 2022-10-15 DIAGNOSIS — M109 Gout, unspecified: Secondary | ICD-10-CM | POA: Diagnosis present

## 2022-10-15 DIAGNOSIS — M86271 Subacute osteomyelitis, right ankle and foot: Secondary | ICD-10-CM | POA: Diagnosis present

## 2022-10-15 DIAGNOSIS — M25421 Effusion, right elbow: Secondary | ICD-10-CM | POA: Diagnosis present

## 2022-10-15 DIAGNOSIS — J9602 Acute respiratory failure with hypercapnia: Secondary | ICD-10-CM | POA: Diagnosis present

## 2022-10-15 DIAGNOSIS — E876 Hypokalemia: Secondary | ICD-10-CM | POA: Diagnosis not present

## 2022-10-15 DIAGNOSIS — R4182 Altered mental status, unspecified: Secondary | ICD-10-CM | POA: Diagnosis not present

## 2022-10-15 DIAGNOSIS — E871 Hypo-osmolality and hyponatremia: Secondary | ICD-10-CM | POA: Diagnosis present

## 2022-10-15 DIAGNOSIS — R6884 Jaw pain: Secondary | ICD-10-CM | POA: Diagnosis present

## 2022-10-15 DIAGNOSIS — D649 Anemia, unspecified: Secondary | ICD-10-CM | POA: Diagnosis not present

## 2022-10-15 DIAGNOSIS — L89610 Pressure ulcer of right heel, unstageable: Secondary | ICD-10-CM | POA: Diagnosis not present

## 2022-10-15 DIAGNOSIS — I70221 Atherosclerosis of native arteries of extremities with rest pain, right leg: Secondary | ICD-10-CM | POA: Diagnosis not present

## 2022-10-15 DIAGNOSIS — F17210 Nicotine dependence, cigarettes, uncomplicated: Secondary | ICD-10-CM | POA: Diagnosis present

## 2022-10-15 DIAGNOSIS — E1169 Type 2 diabetes mellitus with other specified complication: Secondary | ICD-10-CM | POA: Diagnosis present

## 2022-10-15 DIAGNOSIS — Z7951 Long term (current) use of inhaled steroids: Secondary | ICD-10-CM

## 2022-10-15 DIAGNOSIS — L89322 Pressure ulcer of left buttock, stage 2: Secondary | ICD-10-CM | POA: Diagnosis not present

## 2022-10-15 DIAGNOSIS — J9690 Respiratory failure, unspecified, unspecified whether with hypoxia or hypercapnia: Secondary | ICD-10-CM | POA: Diagnosis present

## 2022-10-15 DIAGNOSIS — F1410 Cocaine abuse, uncomplicated: Secondary | ICD-10-CM | POA: Diagnosis present

## 2022-10-15 DIAGNOSIS — J9601 Acute respiratory failure with hypoxia: Secondary | ICD-10-CM | POA: Diagnosis present

## 2022-10-15 DIAGNOSIS — R1310 Dysphagia, unspecified: Secondary | ICD-10-CM | POA: Diagnosis present

## 2022-10-15 DIAGNOSIS — Z79899 Other long term (current) drug therapy: Secondary | ICD-10-CM

## 2022-10-15 DIAGNOSIS — M7989 Other specified soft tissue disorders: Secondary | ICD-10-CM | POA: Diagnosis present

## 2022-10-15 DIAGNOSIS — J1569 Pneumonia due to other gram-negative bacteria: Secondary | ICD-10-CM | POA: Diagnosis not present

## 2022-10-15 DIAGNOSIS — Z833 Family history of diabetes mellitus: Secondary | ICD-10-CM

## 2022-10-15 DIAGNOSIS — I739 Peripheral vascular disease, unspecified: Secondary | ICD-10-CM

## 2022-10-15 LAB — BASIC METABOLIC PANEL
Anion gap: 13 (ref 5–15)
Anion gap: 17 — ABNORMAL HIGH (ref 5–15)
Anion gap: 17 — ABNORMAL HIGH (ref 5–15)
Anion gap: 11 (ref 5–15)
BUN: 5 mg/dL — ABNORMAL LOW (ref 8–23)
BUN: 5 mg/dL — ABNORMAL LOW (ref 8–23)
BUN: 5 mg/dL — ABNORMAL LOW (ref 8–23)
BUN: 5 mg/dL — ABNORMAL LOW (ref 8–23)
CO2: 28 mmol/L (ref 22–32)
CO2: 22 mmol/L (ref 22–32)
CO2: 25 mmol/L (ref 22–32)
CO2: 26 mmol/L (ref 22–32)
Calcium: 8.8 mg/dL — ABNORMAL LOW (ref 8.9–10.3)
Calcium: 8.5 mg/dL — ABNORMAL LOW (ref 8.9–10.3)
Calcium: 8.7 mg/dL — ABNORMAL LOW (ref 8.9–10.3)
Calcium: 8.8 mg/dL — ABNORMAL LOW (ref 8.9–10.3)
Chloride: 71 mmol/L — ABNORMAL LOW (ref 98–111)
Chloride: 78 mmol/L — ABNORMAL LOW (ref 98–111)
Chloride: 80 mmol/L — ABNORMAL LOW (ref 98–111)
Chloride: 87 mmol/L — ABNORMAL LOW (ref 98–111)
Creatinine, Ser: 0.51 mg/dL — ABNORMAL LOW (ref 0.61–1.24)
Creatinine, Ser: 0.57 mg/dL — ABNORMAL LOW (ref 0.61–1.24)
Creatinine, Ser: 0.52 mg/dL — ABNORMAL LOW (ref 0.61–1.24)
Creatinine, Ser: 0.54 mg/dL — ABNORMAL LOW (ref 0.61–1.24)
GFR, Estimated: >60 mL/min (ref >60)
GFR, Estimated: >60 mL/min (ref >60)
GFR, Estimated: >60 mL/min (ref >60)
GFR, Estimated: >60 mL/min (ref >60)
Glucose, Bld: 81 mg/dL (ref 70–99)
Glucose, Bld: 117 mg/dL — ABNORMAL HIGH (ref 70–99)
Glucose, Bld: 111 mg/dL — ABNORMAL HIGH (ref 70–99)
Glucose, Bld: 122 mg/dL — ABNORMAL HIGH (ref 70–99)
Potassium: 4.2 mmol/L (ref 3.5–5.1)
Potassium: 3.8 mmol/L (ref 3.5–5.1)
Potassium: 3.9 mmol/L (ref 3.5–5.1)
Potassium: 4.7 mmol/L (ref 3.5–5.1)
Sodium: 112 mmol/L — CL (ref 135–145)
Sodium: 117 mmol/L — CL (ref 135–145)
Sodium: 122 mmol/L — ABNORMAL LOW (ref 135–145)
Sodium: 124 mmol/L — ABNORMAL LOW (ref 135–145)

## 2022-10-15 LAB — HEPATIC FUNCTION PANEL
ALT: 10 U/L (ref 0–44)
AST: 21 U/L (ref 15–41)
Albumin: 3.8 g/dL (ref 3.5–5.0)
Alkaline Phosphatase: 91 U/L (ref 38–126)
Bilirubin, Direct: 0.4 mg/dL — ABNORMAL HIGH (ref 0.0–0.2)
Indirect Bilirubin: 1 mg/dL — ABNORMAL HIGH (ref 0.3–0.9)
Total Bilirubin: 1.4 mg/dL — ABNORMAL HIGH (ref 0.3–1.2)
Total Protein: 7.1 g/dL (ref 6.5–8.1)

## 2022-10-15 LAB — POCT I-STAT 7, (LYTES, BLD GAS, ICA,H+H)
Acid-base deficit: 1 mmol/L (ref 0.0–2.0)
Bicarbonate: 27.5 mmol/L (ref 20.0–28.0)
Calcium, Ion: 1.2 mmol/L (ref 1.15–1.40)
HCT: 35 % — ABNORMAL LOW (ref 39.0–52.0)
Hemoglobin: 11.9 g/dL — ABNORMAL LOW (ref 13.0–17.0)
O2 Saturation: 92 %
Potassium: 3.4 mmol/L — ABNORMAL LOW (ref 3.5–5.1)
Sodium: 118 mmol/L — CL (ref 135–145)
TCO2: 29 mmol/L (ref 22–32)
pCO2 arterial: 62.4 mmHg — ABNORMAL HIGH (ref 32–48)
pH, Arterial: 7.253 — ABNORMAL LOW (ref 7.35–7.45)
pO2, Arterial: 76 mmHg — ABNORMAL LOW (ref 83–108)

## 2022-10-15 LAB — BRAIN NATRIURETIC PEPTIDE: B Natriuretic Peptide: 71 pg/mL (ref 0.0–100.0)

## 2022-10-15 LAB — CBC
HCT: 31.1 % — ABNORMAL LOW (ref 39.0–52.0)
Hemoglobin: 11.2 g/dL — ABNORMAL LOW (ref 13.0–17.0)
MCH: 32 pg (ref 26.0–34.0)
MCHC: 36 g/dL (ref 30.0–36.0)
MCV: 88.9 fL (ref 80.0–100.0)
Platelets: 423 10*3/uL — ABNORMAL HIGH (ref 150–400)
RBC: 3.5 MIL/uL — ABNORMAL LOW (ref 4.22–5.81)
RDW: 15.1 % (ref 11.5–15.5)
WBC: 15.8 10*3/uL — ABNORMAL HIGH (ref 4.0–10.5)
nRBC: 0.1 % (ref 0.0–0.2)

## 2022-10-15 LAB — PROCALCITONIN: Procalcitonin: <0.1 ng/mL

## 2022-10-15 LAB — I-STAT VENOUS BLOOD GAS, ED
Acid-Base Excess: 3 mmol/L — ABNORMAL HIGH (ref 0.0–2.0)
Bicarbonate: 29.4 mmol/L — ABNORMAL HIGH (ref 20.0–28.0)
Calcium, Ion: 1.01 mmol/L — ABNORMAL LOW (ref 1.15–1.40)
HCT: 37 % — ABNORMAL LOW (ref 39.0–52.0)
Hemoglobin: 12.6 g/dL — ABNORMAL LOW (ref 13.0–17.0)
O2 Saturation: 85 %
Potassium: 4.3 mmol/L (ref 3.5–5.1)
Sodium: 110 mmol/L — CL (ref 135–145)
TCO2: 31 mmol/L (ref 22–32)
pCO2, Ven: 50.5 mmHg (ref 44–60)
pH, Ven: 7.373 (ref 7.25–7.43)
pO2, Ven: 52 mmHg — ABNORMAL HIGH (ref 32–45)

## 2022-10-15 LAB — I-STAT ARTERIAL BLOOD GAS, ED
Acid-Base Excess: 1 mmol/L (ref 0.0–2.0)
Bicarbonate: 29.3 mmol/L — ABNORMAL HIGH (ref 20.0–28.0)
Calcium, Ion: 1.16 mmol/L (ref 1.15–1.40)
HCT: 33 % — ABNORMAL LOW (ref 39.0–52.0)
Hemoglobin: 11.2 g/dL — ABNORMAL LOW (ref 13.0–17.0)
O2 Saturation: 94 %
Potassium: 3.6 mmol/L (ref 3.5–5.1)
Sodium: 112 mmol/L — CL (ref 135–145)
TCO2: 31 mmol/L (ref 22–32)
pCO2 arterial: 62.9 mmHg — ABNORMAL HIGH (ref 32–48)
pH, Arterial: 7.276 — ABNORMAL LOW (ref 7.35–7.45)
pO2, Arterial: 84 mmHg (ref 83–108)

## 2022-10-15 LAB — MAGNESIUM: Magnesium: 1.4 mg/dL — ABNORMAL LOW (ref 1.7–2.4)

## 2022-10-15 LAB — GLUCOSE, CAPILLARY
Glucose-Capillary: 102 mg/dL — ABNORMAL HIGH (ref 70–99)
Glucose-Capillary: 102 mg/dL — ABNORMAL HIGH (ref 70–99)
Glucose-Capillary: 122 mg/dL — ABNORMAL HIGH (ref 70–99)
Glucose-Capillary: 129 mg/dL — ABNORMAL HIGH (ref 70–99)
Glucose-Capillary: 138 mg/dL — ABNORMAL HIGH (ref 70–99)
Glucose-Capillary: 56 mg/dL — ABNORMAL LOW (ref 70–99)

## 2022-10-15 LAB — ETHANOL: Alcohol, Ethyl (B): <10 mg/dL (ref <10)

## 2022-10-15 LAB — TROPONIN I (HIGH SENSITIVITY)
Troponin I (High Sensitivity): 4 ng/L (ref <18)
Troponin I (High Sensitivity): 3 ng/L (ref <18)

## 2022-10-15 LAB — RESP PANEL BY RT-PCR (RSV, FLU A&B, COVID)  RVPGX2
Influenza A by PCR: NEGATIVE
Influenza B by PCR: NEGATIVE
Resp Syncytial Virus by PCR: NEGATIVE
SARS Coronavirus 2 by RT PCR: NEGATIVE

## 2022-10-15 LAB — OSMOLALITY: Osmolality: 237 mOsm/kg — CL (ref 275–295)

## 2022-10-15 LAB — T4, FREE: Free T4: 1.17 ng/dL — ABNORMAL HIGH (ref 0.61–1.12)

## 2022-10-15 LAB — PHOSPHORUS: Phosphorus: 3.5 mg/dL (ref 2.5–4.6)

## 2022-10-15 LAB — TSH: TSH: 1.292 u[IU]/mL (ref 0.350–4.500)

## 2022-10-15 LAB — MRSA NEXT GEN BY PCR, NASAL: MRSA by PCR Next Gen: DETECTED — AB

## 2022-10-15 MED ORDER — ADULT MULTIVITAMIN W/MINERALS CH
1.0000 | ORAL_TABLET | Freq: Every day | ORAL | Status: DC
  Administered 2022-10-15 – 2022-10-21 (×6): 1
  Filled 2022-10-15 (×7): qty 1

## 2022-10-15 MED ORDER — PROSOURCE TF20 ENFIT COMPATIBL EN LIQD
60.0000 mL | Freq: Every day | ENTERAL | Status: DC
  Administered 2022-10-15 – 2022-10-16 (×2): 60 mL
  Filled 2022-10-15 (×2): qty 60

## 2022-10-15 MED ORDER — POTASSIUM CHLORIDE 20 MEQ PO PACK
40.0000 meq | PACK | Freq: Once | ORAL | Status: AC
  Administered 2022-10-15: 40 meq
  Filled 2022-10-15: qty 2

## 2022-10-15 MED ORDER — PROPOFOL 1000 MG/100ML IV EMUL
0.0000 ug/kg/min | INTRAVENOUS | Status: DC
  Administered 2022-10-15: 5 ug/kg/min via INTRAVENOUS

## 2022-10-15 MED ORDER — OSMOLITE 1.5 CAL PO LIQD
1000.0000 mL | ORAL | Status: DC
  Administered 2022-10-15: 1000 mL
  Filled 2022-10-15: qty 1000

## 2022-10-15 MED ORDER — POLYETHYLENE GLYCOL 3350 17 G PO PACK: 17.0000 g | PACK | Freq: Every day | ORAL | Status: DC | PRN

## 2022-10-15 MED ORDER — FENTANYL CITRATE PF 50 MCG/ML IJ SOSY
25.0000 ug | PREFILLED_SYRINGE | INTRAMUSCULAR | Status: DC | PRN
  Filled 2022-10-15: qty 1

## 2022-10-15 MED ORDER — DOCUSATE SODIUM 50 MG/5ML PO LIQD
100.0000 mg | Freq: Two times a day (BID) | ORAL | Status: DC
  Administered 2022-10-15 – 2022-10-25 (×8): 100 mg
  Filled 2022-10-15 (×22): qty 10

## 2022-10-15 MED ORDER — FAMOTIDINE 20 MG PO TABS: 20.0000 mg | ORAL_TABLET | Freq: Two times a day (BID) | ORAL | Status: DC

## 2022-10-15 MED ORDER — ORAL CARE MOUTH RINSE
15.0000 mL | OROMUCOSAL | Status: DC
  Administered 2022-10-15 – 2022-10-16 (×9): 15 mL via OROMUCOSAL

## 2022-10-15 MED ORDER — METHYLPREDNISOLONE SODIUM SUCC 125 MG IJ SOLR
125.0000 mg | Freq: Once | INTRAMUSCULAR | Status: AC
  Administered 2022-10-15: 125 mg via INTRAVENOUS
  Filled 2022-10-15: qty 2

## 2022-10-15 MED ORDER — THIAMINE MONONITRATE 100 MG PO TABS: 100.0000 mg | ORAL_TABLET | Freq: Every day | ORAL | Status: DC

## 2022-10-15 MED ORDER — DEXTROSE 50 % IV SOLN
12.5000 g | INTRAVENOUS | Status: AC
  Administered 2022-10-15: 12.5 g via INTRAVENOUS

## 2022-10-15 MED ORDER — THIAMINE MONONITRATE 100 MG PO TABS
100.0000 mg | ORAL_TABLET | Freq: Every day | ORAL | Status: DC
  Administered 2022-10-16 – 2022-10-29 (×13): 100 mg
  Filled 2022-10-15 (×15): qty 1

## 2022-10-15 MED ORDER — FENTANYL CITRATE PF 50 MCG/ML IJ SOSY
25.0000 ug | PREFILLED_SYRINGE | INTRAMUSCULAR | Status: DC | PRN
  Administered 2022-10-15 (×4): 50 ug via INTRAVENOUS
  Filled 2022-10-15: qty 1
  Filled 2022-10-15: qty 2

## 2022-10-15 MED ORDER — HEPARIN SODIUM (PORCINE) 5000 UNIT/ML IJ SOLN
5000.0000 [IU] | Freq: Three times a day (TID) | INTRAMUSCULAR | Status: DC
  Administered 2022-10-15 – 2022-12-03 (×141): 5000 [IU] via SUBCUTANEOUS
  Filled 2022-10-15 (×138): qty 1

## 2022-10-15 MED ORDER — PANTOPRAZOLE SODIUM 40 MG IV SOLR
40.0000 mg | Freq: Every day | INTRAVENOUS | Status: DC
  Administered 2022-10-15: 40 mg via INTRAVENOUS
  Filled 2022-10-15: qty 10

## 2022-10-15 MED ORDER — SODIUM CHLORIDE 0.9 % IV SOLN
3.0000 g | Freq: Four times a day (QID) | INTRAVENOUS | Status: AC
  Administered 2022-10-15 – 2022-10-20 (×20): 3 g via INTRAVENOUS
  Filled 2022-10-15 (×20): qty 8

## 2022-10-15 MED ORDER — ALBUTEROL SULFATE (2.5 MG/3ML) 0.083% IN NEBU
10.0000 mg/h | INHALATION_SOLUTION | Freq: Once | RESPIRATORY_TRACT | Status: AC
  Administered 2022-10-15: 10 mg/h via RESPIRATORY_TRACT
  Filled 2022-10-15: qty 12

## 2022-10-15 MED ORDER — LORAZEPAM 2 MG/ML IJ SOLN
1.0000 mg | INTRAMUSCULAR | Status: AC | PRN
  Administered 2022-10-16: 1 mg via INTRAVENOUS
  Filled 2022-10-15 (×2): qty 1

## 2022-10-15 MED ORDER — ETOMIDATE 2 MG/ML IV SOLN
INTRAVENOUS | Status: AC
  Administered 2022-10-15: 20 mg
  Filled 2022-10-15: qty 20

## 2022-10-15 MED ORDER — FOLIC ACID 1 MG PO TABS: 1.0000 mg | ORAL_TABLET | Freq: Every day | ORAL | Status: DC

## 2022-10-15 MED ORDER — LORAZEPAM 1 MG PO TABS: 1.0000 mg | ORAL_TABLET | ORAL | Status: AC | PRN

## 2022-10-15 MED ORDER — MIDAZOLAM HCL 2 MG/2ML IJ SOLN
INTRAMUSCULAR | Status: AC
  Administered 2022-10-15: 2 mg via INTRAVENOUS
  Filled 2022-10-15: qty 2

## 2022-10-15 MED ORDER — FENTANYL CITRATE PF 50 MCG/ML IJ SOSY
PREFILLED_SYRINGE | INTRAMUSCULAR | Status: AC
  Administered 2022-10-15: 50 ug via INTRAVENOUS
  Filled 2022-10-15: qty 2

## 2022-10-15 MED ORDER — PROPOFOL 1000 MG/100ML IV EMUL
0.0000 ug/kg/min | INTRAVENOUS | Status: DC
  Administered 2022-10-15: 40 ug/kg/min via INTRAVENOUS
  Administered 2022-10-16: 10 ug/kg/min via INTRAVENOUS
  Filled 2022-10-15 (×2): qty 100

## 2022-10-15 MED ORDER — FOLIC ACID 1 MG PO TABS
1.0000 mg | ORAL_TABLET | Freq: Every day | ORAL | Status: DC
  Administered 2022-10-15 – 2022-10-29 (×13): 1 mg
  Filled 2022-10-15 (×16): qty 1

## 2022-10-15 MED ORDER — THIAMINE HCL 100 MG/ML IJ SOLN
100.0000 mg | Freq: Every day | INTRAMUSCULAR | Status: DC
  Administered 2022-10-15: 100 mg via INTRAVENOUS
  Filled 2022-10-15: qty 2

## 2022-10-15 MED ORDER — POLYETHYLENE GLYCOL 3350 17 G PO PACK
17.0000 g | PACK | Freq: Every day | ORAL | Status: DC
  Administered 2022-10-19 – 2022-10-29 (×4): 17 g
  Filled 2022-10-15 (×10): qty 1

## 2022-10-15 MED ORDER — DEXTROSE 50 % IV SOLN
INTRAVENOUS | Status: AC
  Filled 2022-10-15: qty 50

## 2022-10-15 MED ORDER — MUPIROCIN 2 % EX OINT
1.0000 | TOPICAL_OINTMENT | Freq: Two times a day (BID) | CUTANEOUS | Status: AC
  Administered 2022-10-15 – 2022-10-20 (×10): 1 via NASAL
  Filled 2022-10-15 (×5): qty 22

## 2022-10-15 MED ORDER — SODIUM CHLORIDE 0.9 % IV SOLN: INTRAVENOUS | Status: DC

## 2022-10-15 MED ORDER — MIDAZOLAM HCL 2 MG/2ML IJ SOLN: 1.0000 mg | INTRAMUSCULAR | Status: DC | PRN

## 2022-10-15 MED ORDER — CHLORHEXIDINE GLUCONATE CLOTH 2 % EX PADS
6.0000 | MEDICATED_PAD | Freq: Every day | CUTANEOUS | Status: AC
  Administered 2022-10-16 – 2022-10-20 (×4): 6 via TOPICAL

## 2022-10-15 MED ORDER — ADULT MULTIVITAMIN W/MINERALS CH: 1.0000 | ORAL_TABLET | Freq: Every day | ORAL | Status: DC

## 2022-10-15 MED ORDER — INSULIN ASPART 100 UNIT/ML IJ SOLN
0.0000 [IU] | INTRAMUSCULAR | Status: DC
  Administered 2022-10-16: 1 [IU] via SUBCUTANEOUS

## 2022-10-15 MED ORDER — DOCUSATE SODIUM 50 MG/5ML PO LIQD: 100.0000 mg | Freq: Two times a day (BID) | ORAL | Status: DC | PRN

## 2022-10-15 MED ORDER — ORAL CARE MOUTH RINSE: 15.0000 mL | OROMUCOSAL | Status: DC | PRN

## 2022-10-15 NOTE — ED Provider Notes (Signed)
[facility] Provider Note   CSN: [id number] Arrival date & time: 10/15/22  0630     History  Chief Complaint  Patient presents with   Shortness of Breath   Chest Pain   Level 5 caveat due to acuity of condition Ronald [name] is a 67 y.o. male.  The history is provided by the patient and the EMS personnel. The history is limited by the condition of the patient.  Patient history of COPD, alcohol use disorder presents with shortness of breath.  Most the history provided by EMS.  They report they were called out for shortness of breath and chest pain.  On arrival patient was hypoxic to the 40s.  He has had some improvement nebulized treatments. Patient is unable to provide any further history    Past Medical History:  Diagnosis Date   Critical lower limb ischemia ([place])    ETOH abuse    Gout    Hypertension     Home Medications Prior to Admission medications   Medication Sig Start Date End Date Taking? Authorizing Provider  albuterol (VENTOLIN HFA) 108 (90 Base) MCG/ACT inhaler Inhale 2 puffs into the lungs every 6 (six) hours as needed for wheezing or shortness of breath. 09/17/22   Patrecia Pour, MD  amLODipine (NORVASC) 10 MG tablet Take 1 tablet (10 mg total) by mouth daily. 09/17/22   Patrecia Pour, MD  fluticasone furoate-vilanterol (BREO ELLIPTA) 100-25 MCG/ACT AEPB Inhale 1 puff into the lungs daily. 09/17/22   Patrecia Pour, MD  folic acid (FOLVITE) 1 MG tablet Take 1 tablet (1 mg total) by mouth daily. 04/17/22   Shelly Coss, MD  guaiFENesin-dextromethorphan (ROBITUSSIN DM) 100-10 MG/5ML syrup Take 10 mLs by mouth every 4 (four) hours as needed for cough. 09/28/22   Hosie Poisson, MD  lisinopril (ZESTRIL) 10 MG tablet Take 1 tablet (10 mg total) by mouth daily. 09/17/22   Patrecia Pour, MD  Multiple Vitamin (MULTIVITAMIN WITH MINERALS) TABS tablet Take 1 tablet by mouth daily. 09/29/22   Hosie Poisson, MD  pantoprazole (PROTONIX) 40  MG tablet Take 1 tablet (40 mg total) by mouth daily. 09/29/22   Hosie Poisson, MD  thiamine (VITAMIN B1) 100 MG tablet Take 1 tablet (100 mg total) by mouth daily. Patient taking differently: Take 100 mg by mouth 2 (two) times a week. 09/17/22   Patrecia Pour, MD  ADVAIR [facility] 230-21 MCG/ACT inhaler Inhale 2 puffs into the lungs 2 (two) times daily. 04/16/22 09/17/22  Shelly Coss, MD      Allergies    Patient has no known allergies.    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  Deformity noted. SKIN: warm, color normal PSYCH: Unable to assess  ED Results / Procedures / Treatments   Labs (all labs ordered are listed, but only abnormal results are displayed) Labs Reviewed  CBC - Abnormal; Notable for the  following components:      Result Value   WBC 15.8 (*)    RBC 3.50 (*)    Hemoglobin 11.2 (*)    HCT 31.1 (*)    Platelets 423 (*)    All other components within normal limits  I-STAT VENOUS BLOOD GAS, ED - Abnormal; Notable for the following components:   pO2, Ven 52 (*)    Bicarbonate 29.4 (*)    Acid-Base Excess 3.0 (*)    Sodium 110 (*)    Calcium, Ion 1.01 (*)    HCT 37.0 (*)    Hemoglobin 12.6 (*)    All other components within normal limits  RESP PANEL BY RT-PCR (RSV, FLU A&B, COVID)  RVPGX2  BRAIN NATRIURETIC PEPTIDE  ETHANOL  BASIC METABOLIC PANEL  OSMOLALITY  TSH  T4, FREE  NA  AND K (SODIUM & POTASSIUM), RAND UR  OSMOLALITY, URINE    EKG EKG Interpretation  Date/Time:  Friday October 15 2022 06:32:00 EST Ventricular Rate:  109 PR Interval:  161 QRS Duration: 123 QT Interval:  351 QTC Calculation: 473 R Axis:   258 Text Interpretation: Sinus tachycardia Probable left atrial enlargement RBBB and LAFB Interpretation limited secondary to artifact Confirmed by Ripley Fraise [phone]) on 10/15/2022 6:37:15 AM Prehospital EKG shows a rate of 99, with right bundle branch block no STEMI  Radiology DG Chest [facility] 1 View  Result Date: 10/15/2022 CLINICAL DATA:  Shortness of breath. EXAM: PORTABLE CHEST 1 VIEW COMPARISON:  09/19/2022 FINDINGS: 0642 hours. The lungs are clear without focal pneumonia, edema, pneumothorax or pleural effusion. The cardiopericardial silhouette is within normal limits for size. The visualized bony structures of the thorax are unremarkable. Telemetry leads overlie the chest. IMPRESSION: No active disease. Electronically Signed   By: Misty Stanley M.D.   On: 10/15/2022 07:15   DG Foot 2 Views Right  Result Date: 10/15/2022 CLINICAL DATA:  Swelling and inflammation of the foot EXAM: RIGHT FOOT - 2 VIEW COMPARISON:  09/11/2022 FINDINGS: Prominent soft tissue swelling centered at the lateral hindfoot/ankle but diffuse. Punctate metallic foreign body at the little digit laterally, new from prior but not central to the swelling. Heterogeneous partially sclerotic lesion in the distal tibia which has been seen since at least 2009. First MTP osteoarthritis with prominent spurring. IMPRESSION: 1. Marked soft tissue swelling without gas or acute osseous finding. 2. Punctate metallic foreign body at the lateral little digit. Electronically Signed   By: Jorje Guild M.D.   On: 10/15/2022 07:10    Procedures .Critical Care  Performed by: Ripley Fraise, MD Authorized by: Ripley Fraise, MD   Critical care provider statement:    Critical care time  (minutes):  40   Critical care start time:  10/15/2022 6:40 AM   Critical care end time:  10/15/2022 7:20 AM   Critical care time was exclusive of:  Separately billable procedures and treating other patients   Critical care was necessary to treat or prevent imminent or life-threatening deterioration of the following conditions:  Respiratory failure and cardiac failure   Critical care was time spent personally by me on the following activities:  Examination of patient, development of treatment plan with patient or surrogate, pulse oximetry, ordering and review of radiographic studies, ordering and review of laboratory studies, re-evaluation of patient's condition, ordering and performing treatments and interventions, evaluation of patient's response to treatment, obtaining history from patient or surrogate and review of old charts   I assumed direction of critical care for this patient  from another provider in my specialty: no       Medications Ordered in ED Medications  methylPREDNISolone sodium succinate (SOLU-MEDROL) 125 mg/2 mL injection 125 mg (125 mg Intravenous Given 10/15/22 0654)  albuterol (PROVENTIL) (2.5 MG/3ML) 0.083% nebulizer solution (10 mg/hr Nebulization Given 10/15/22 0651)    ED Course/ Medical Decision Making/ A&P Clinical Course as of 10/15/22 0734  Fri Oct 15, 2022  0642 Patient presents in respiratory distress, likely due to COPD.  He will be placed on noninvasive ventilation.  He will need to be admitted [DW]  0708 WBC(!): 15.8 Leukocytosis [DW]  0709 Sodium(!!): 110 Hyponatremia [DW]  0709 Overall patient's respiratory status is improving with noninvasive ventilation.  However he has significant hyponatremia, will consult critical care [DW]  [phone] Signed out to Dr. Earl Lites at shift change.  ICU consult pending [DW]    Clinical Course User Index [DW] Ripley Fraise, MD                             Medical Decision Making Amount and/or Complexity of Data  Reviewed Labs: ordered. Decision-making details documented in ED Course. Radiology: ordered.  Risk Prescription drug management. Decision regarding hospitalization.   This patient presents to the ED for concern of shortness of breath, this involves an extensive number of treatment options, and is a complaint that carries with it a high risk of complications and morbidity.  The differential diagnosis includes but is not limited to Acute coronary syndrome, pneumonia, acute pulmonary edema, pneumothorax, acute anemia, pulmonary embolism    Comorbidities that complicate the patient evaluation: Patient's presentation is complicated by their history of COPD  Social Determinants of Health: Patient's  alcohol use disorder   increases the complexity of managing their presentation  Additional history obtained: Additional history obtained from EMS  Records reviewed previous admission documents  Lab Tests: I Ordered, and personally interpreted labs.  The pertinent results include:  hyponatremia  Imaging Studies ordered: I ordered imaging studies including X-ray chest   I independently visualized and interpreted imaging which showed no acute findings I agree with the radiologist interpretation  Cardiac Monitoring: The patient was maintained on a cardiac monitor.  I personally viewed and interpreted the cardiac monitor which showed an underlying rhythm of:  sinus tachycardia  Medicines ordered and prescription drug management: I ordered medication including nebulized treatments and steroids for wheezing Reevaluation of the patient after these medicines showed that the patient    stayed the same   Critical Interventions:   noninvasive ventilation   Reevaluation: After the interventions noted above, I reevaluated the patient and found that they have :improved  Complexity of problems addressed: Patient's presentation is most consistent with  acute presentation with potential threat to  life or bodily function  Disposition: After consideration of the diagnostic results and the patient's response to treatment,  I feel that the patent would benefit from admission   .           Final Clinical Impression(s) / ED Diagnoses Final diagnoses:  COPD exacerbation ([place])  Acute respiratory failure with hypoxia ([place])  Hyponatremia    Rx / DC Orders ED Discharge Orders     None         Ripley Fraise, MD 10/15/22 [phone]

## 2022-10-15 NOTE — Progress Notes (Signed)
Initial Nutrition Assessment  DOCUMENTATION CODES:   Severe malnutrition in context of chronic illness  INTERVENTION:   Initiate tube feeds via OG tube: - Start Osmolite 1.5 @ 20 ml/hr and advance rate by 10 ml q 8 hours to goal rate of 50 ml/hr (1200 ml/day) - PROSource TF20 60 ml daily   Tube feeding regimen at goal rate provides 1880 kcal, 95 grams of protein, and 914 ml of H2O.   Monitor magnesium, potassium, and phosphorus q 12 hours for at least 6 occurrences, MD to replete as needed, as pt is at risk for refeeding syndrome given severe malnutrition, ETOH abuse.   - Continue MVI with minerals daily per tube   - Continue folic acid and thiamine daily per tube  NUTRITION DIAGNOSIS:   Severe Malnutrition related to chronic illness (COPD, polysubstance abuse) as evidenced by severe fat depletion, severe muscle depletion.  GOAL:   Patient will meet greater than or equal to 90% of their needs  MONITOR:   Vent status, Labs, Weight trends, TF tolerance, Skin  REASON FOR ASSESSMENT:   Ventilator, Consult Enteral/tube feeding initiation and management  ASSESSMENT:   67 year old male who presented to the ED on 2/02 with SOB, chest pain, and AMS. PMH of COPD, ETOH abuse, cocaine abuse, noncompliance, DM, PVD s/p L BKA. Pt required intubation. Pt admitted with acute respiratory failure, hyponatremia.  Discussed pt with RN and during ICU rounds. Consult received for enteral nutrition initiation and management. Pt with OG tube tip and side port projecting over the stomach per x-ray.  Unable to obtain diet and weight history at this time. If weight history in chart is correct, pt with a total weight loss of 13.8 kg since 04/14/22. This is a 19% weight loss in 6 months which is severe and significant for timeframe. Pt meets criteria for severe malnutrition based on weight loss and NFPE.  Pt is at refeeding risk. Will start tube feeds at trickle rate and slowly advance to goal.  Discussed with RN. Pt currently on thiamine.  Patient is currently intubated on ventilator support MV: 9.5 L/min Temp (24hrs), Avg:97.9 F (36.6 C), Min:97.7 F (36.5 C), Max:98.1 F (36.7 C)  Drips: Propofol: 5 mcg/kg/min NS: 100 ml/hr  Medications reviewed and include: folic acid, MVI with minerals, IV protonix, miralax, thiamine, IV abx  Labs reviewed: sodium 112, BUN 5, creatinine 0.51, WBC 15.8  NUTRITION - FOCUSED PHYSICAL EXAM:  Flowsheet Row Most Recent Value  Orbital Region Moderate depletion  Upper Arm Region Severe depletion  Thoracic and Lumbar Region Severe depletion  Buccal Region Unable to assess  Temple Region Severe depletion  Clavicle Bone Region Severe depletion  Clavicle and Acromion Bone Region Severe depletion  Scapular Bone Region Unable to assess  Dorsal Hand Moderate depletion  Patellar Region Severe depletion  [RLE only due to L BKA]  Anterior Thigh Region Severe depletion  Posterior Calf Region Severe depletion  Edema (RD Assessment) None  Hair Reviewed  Eyes Reviewed  Mouth Reviewed  Skin Reviewed  Nails Reviewed       Diet Order:   Diet Order             Diet NPO time specified  Diet effective now                   EDUCATION NEEDS:   Not appropriate for education at this time  Skin:  Skin Assessment: Skin Integrity Issues: Stage II: L elbow, L ischial tuberosity  Last BM:  no documented BM  Height:   Ht Readings from Last 1 Encounters:  10/15/22 5\' 7"  (1.702 m)    Weight:   Wt Readings from Last 1 Encounters:  09/23/22 58.8 kg    BMI:  Body mass index is 20.3 kg/m.  Estimated Nutritional Needs:   Kcal:  1850-2050  Protein:  85-105 grams  Fluid:  1.8-2.0 L    Gustavus Bryant, MS, RD, LDN Inpatient Clinical Dietitian Please see AMi[facility] for contact information.

## 2022-10-15 NOTE — Progress Notes (Signed)
Hypoglycemic Event  CBG: 56   Treatment: D50 25 mL (12.5 gm)  Symptoms: None  Follow-up CBG: Time: 1136 CBG Result:129   Possible Reasons for Event: Unknown  Comments/MD notified: no new orders      Ronald [name]  [name]

## 2022-10-15 NOTE — ED Provider Notes (Signed)
  Provider Note MRN:  [id number]  Arrival date & time: 10/15/22    ED Course and Medical Decision Making  Assumed care from Dr Christy Gentles at shift change.  See note from prior team for complete details, in brief:  67 yo male, hx COPD/ETOH, ongoing alcohol use. Beer potomania. Hypoxic on arrival @45 %   COPD exacerbation, on BIPAP > doing better, pulse ox stable Chest x-ray reviewed and is stable,  foot x-ray right without obvious osteomyelitis, h/ever soft tissue swelling noted  CIWA ordered Hyponatremia 110 on istat, 112 on BMP; urine lytes and osm ordered Pt will require admission, favor beer potomania and COPD exacerbation as etiology for complaints today  8:42 AM Spoke with PCCM who accepts pt for admit          .Critical Care  Performed by: Jeanell Sparrow, DO Authorized by: Jeanell Sparrow, DO   Critical care provider statement:    Critical care time (minutes):  30   Critical care time was exclusive of:  Separately billable procedures and treating other patients   Critical care was necessary to treat or prevent imminent or life-threatening deterioration of the following conditions:  Metabolic crisis   Critical care was time spent personally by me on the following activities:  Development of treatment plan with patient or surrogate, discussions with consultants, evaluation of patient's response to treatment, examination of patient, ordering and review of laboratory studies, ordering and review of radiographic studies, ordering and performing treatments and interventions, pulse oximetry, re-evaluation of patient's condition, review of old charts and obtaining history from patient or surrogate   Care discussed with: admitting provider     Final Clinical Impressions(s) / ED Diagnoses     ICD-10-CM   1. COPD exacerbation ([place])  J44.1     2. Acute respiratory failure with hypoxia ([facility])  J96.01     3. Hyponatremia  E87.1     4. Alcohol abuse  F10.10     5. Tobacco abuse   Z72.0       ED Discharge Orders     None       Discharge Instructions   None        Jeanell Sparrow, DO 10/15/22 [phone]

## 2022-10-15 NOTE — Progress Notes (Signed)
EEG complete - results pending 

## 2022-10-15 NOTE — ED Notes (Signed)
Pt intubated at this time   50 mcg fentanyl @ 0935 50 mcg fentanyl and 2mg  versed @ 0937 20 mg  etomidate @ 0938 100 mg rocuronium @ 0938  8.0 ETT inserted @ 0940 24 @ lip Positive color change, positive bilateral breath sounds

## 2022-10-15 NOTE — Progress Notes (Signed)
Pt transported to ICU via vent 100% fio2 w/ no apparent complications.  Once in ICU, fio2 weaned to 50% per vent orders.  ICU RT given report.

## 2022-10-15 NOTE — Procedures (Signed)
Patient Name: Ronald [name]  MRN: [id number]  Epilepsy Attending: Lora Havens  Referring Physician/Provider: Candee Furbish, MD  Date: 10/15/2022 Duration: 22.33 mins  Patient history: 67yo M with ams. EEG to evaluate for seizure  Level of alertness:  lethargic   AEDs during EEG study: propofol  Technical aspects: This EEG study was done with scalp electrodes positioned according to the 10-20 International system of electrode placement. Electrical activity was reviewed with band pass filter of 1-70Hz , sensitivity of 7 uV/mm, display speed of 79mm/sec with a 60Hz  notched filter applied as appropriate. EEG data were recorded continuously and digitally stored.  Video monitoring was available and reviewed as appropriate.  Description: EEG showed continuous generalized 3 to 6 Hz theta-delta slowing admixed with 13-15Hz  generalized beta activity. Hyperventilation and photic stimulation were not performed.     ABNORMALITY - Continuous slow, generalized  IMPRESSION: This study is suggestive of moderate to severe diffuse encephalopathy, nonspecific etiology. No seizures or epileptiform discharges were seen throughout the recording.  Ronald [name] [name]

## 2022-10-15 NOTE — ED Triage Notes (Signed)
Pt arrived from home via [facility] w c.o sob and chest pain. Per ems upon arrival pt o2 on ra was 45%

## 2022-10-15 NOTE — H&P (Signed)
NAME:  Ronald [name], MRN:  [id number], DOB:  [birth year], LOS: 0 ADMISSION DATE:  10/15/2022, CONSULTATION DATE: 6 10/15/2022 REFERRING MD: Emergency department physician, CHIEF COMPLAINT: Metabolic disarray respiratory failure  History of Present Illness:   67 year old male who is known EtOH abuse cocaine abuse noncompliance presents with altered mental status along with noncompliance.  He was recently admitted and discharged with concern that he needed to go to a skilled nursing facility for which he refused.  He now presents with a sodium of 125, altered mental status hypoxic and hypercarbic and noted to be aspirating while on noninvasive mechanical ventilatory support.  Due to the fact unable to protect his airway appropriately he would be intubated until his metabolic disarray is corrected. Pertinent  Medical History   Past Medical History:  Diagnosis Date   Critical lower limb ischemia ([place])    ETOH abuse    Gout    Hypertension      Significant Hospital Events: Including procedures, antibiotic start and stop dates in addition to other pertinent events     Interim History / Subjective:  Frequent admissions for hyponatremia and alcohol withdrawal now with high hypoxia and hypercarbia aspiration concerns on BiPAP  Objective   Blood pressure 138/78, pulse 98, temperature 97.7 F (36.5 C), temperature source Oral, resp. rate 15, SpO2 100 %.    FiO2 (%):  [35 %] 35 %  No intake or output data in the 24 hours ending 10/15/22 0912 There were no vitals filed for this visit.  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     Neuro: Somnolent but arousable GU: [place] [facility] Problem list     Assessment & Plan:  Acute  hypercarbic/hypoxic respiratory failure in the setting of metabolic disarray probably chronic EtOH abuse hyponatremia He will require intubation as he was aspirating on BiPAP Creatinine management per ABGs Serial chest x-rays Monitor for withdrawal Utilize benzodiazepines for sedation  Hyponatremia in the setting of copious beer consumption with BUN of 5 and sodium of 125 Monitor sodium No acute interventions for sodium as her most likely correct itself through withdrawal EtOH  Diabetes mellitus Sliding scale insulin  Chronic EtOH abuse with concern for withdrawal Folic acid Thiamine Benzodiazepines Consider psychiatric evaluation for possible involuntary commitment  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 Monitor    Best Practice (right click and "Reselect all SmartList Selections" daily)   Diet/type: NPO DVT prophylaxis: not indicated GI prophylaxis: PPI Lines: N/A Foley:  N/A Code Status:  full code Last date of multidisciplinary goals of care discussion [tbd]  Labs   CBC: Recent Labs  Lab 10/15/22 0636 10/15/22 0642 10/15/22 0903  WBC 15.8*  --   --   HGB 11.2* 12.6* 11.2*  HCT 31.1* 37.0* 33.0*  MCV 88.9  --   --   PLT 423*  --   --     Basic Metabolic Panel: Recent Labs  Lab 10/15/22 0636 10/15/22 0642 10/15/22 0903  NA 112* 110* 112*  K 4.2 4.3 3.6  CL 71*  --   --   CO2 28  --   --   GLUCOSE 81  --   --   BUN 5*  --   --   CREATININE 0.51*  --   --   CALCIUM 8.8*  --   --  GFR: CrCl cannot be calculated (Unknown ideal weight.). Recent Labs  Lab 10/15/22 0636  WBC 15.8*    Liver Function Tests: No results for input(s): "AST", "ALT", "ALKPHOS", "BILITOT", "PROT", "ALBUMIN" in the last 168 hours. No results for input(s): "LIPASE", "AMYLASE" in the last 168 hours. No results for input(s): "AMMONIA" in the last 168 hours.  ABG    Component Value Date/Time   PHART 7.276 (L) 10/15/2022 0903   PCO2ART 62.9 (H) 10/15/2022 0903    PO2ART 84 10/15/2022 0903   HCO3 29.3 (H) 10/15/2022 0903   TCO2 31 10/15/2022 0903   ACIDBASEDEF 1.0 09/02/2022 1739   O2SAT 94 10/15/2022 0903     Coagulation Profile: No results for input(s): "INR", "PROTIME" in the last 168 hours.  Cardiac Enzymes: No results for input(s): "CKTOTAL", "CKMB", "CKMBINDEX", "TROPONINI" in the last 168 hours.  HbA1C: Hgb A1c MFr Bld  Date/Time Value Ref Range Status  09/02/2022 09:03 PM 5.6 4.8 - 5.6 % Final    Comment:    (NOTE)         Prediabetes: 5.7 - 6.4         Diabetes: >6.4         Glycemic control for adults with diabetes: <7.0   11/09/2013 01:00 PM 5.9 (H) <5.7 % Final    Comment:    (NOTE)                                                                       According to the ADA Clinical Practice Recommendations for 2011, when HbA1c is used as a screening test:  >=6.5%   Diagnostic of Diabetes Mellitus           (if abnormal result is confirmed) 5.7-6.4%   Increased risk of developing Diabetes Mellitus References:Diagnosis and Classification of Diabetes Mellitus,Diabetes RXVQ,0086,76(PPJKD 1):S62-S69 and Standards of Medical Care in         Diabetes - 2011,Diabetes TOIZ,1245,80 (Suppl 1):S11-S61.    CBG: No results for input(s): "GLUCAP" in the last 168 hours.  Review of Systems:   na  Past Medical History:  He,  has a past medical history of Critical lower limb ischemia ([place]), ETOH abuse, Gout, and Hypertension.   Surgical History:   Past Surgical History:  Procedure Laterality Date   ABDOMINAL AORTAGRAM N/A 12/26/2013   Procedure: ABDOMINAL AORTAGRAM;  Surgeon: Serafina Mitchell, MD;  Location: [facility] CATH LAB;  Service: Cardiovascular;  Laterality: N/A;   AMPUTATION Left 12/28/2013   Procedure: LEFT BELOW KNEE AMPUTATION;  Surgeon: Newt Minion, MD;  Location: [place];  Service: Orthopedics;  Laterality: Left;   BELOW KNEE LEG AMPUTATION     Left   LEG SURGERY       Social History:   reports that he has been  smoking cigarettes. He has a 20.00 pack-year smoking history. He has never used smokeless tobacco. He reports current alcohol use of about 120.0 standard drinks of alcohol per week. He reports current drug use. Drugs: Cocaine and Marijuana.   Family History:  His family history includes Diabetes Mellitus II in his mother. There is no history of CAD or Stroke.   Allergies No Known Allergies   Home Medications  Prior to Admission medications  Medication Sig Start Date End Date Taking? Authorizing Provider  albuterol (VENTOLIN HFA) 108 (90 Base) MCG/ACT inhaler Inhale 2 puffs into the lungs every 6 (six) hours as needed for wheezing or shortness of breath. 09/17/22   Patrecia Pour, MD  amLODipine (NORVASC) 10 MG tablet Take 1 tablet (10 mg total) by mouth daily. 09/17/22   Patrecia Pour, MD  fluticasone furoate-vilanterol (BREO ELLIPTA) 100-25 MCG/ACT AEPB Inhale 1 puff into the lungs daily. 09/17/22   Patrecia Pour, MD  folic acid (FOLVITE) 1 MG tablet Take 1 tablet (1 mg total) by mouth daily. 04/17/22   Shelly Coss, MD  guaiFENesin-dextromethorphan (ROBITUSSIN DM) 100-10 MG/5ML syrup Take 10 mLs by mouth every 4 (four) hours as needed for cough. 09/28/22   Hosie Poisson, MD  lisinopril (ZESTRIL) 10 MG tablet Take 1 tablet (10 mg total) by mouth daily. 09/17/22   Patrecia Pour, MD  Multiple Vitamin (MULTIVITAMIN WITH MINERALS) TABS tablet Take 1 tablet by mouth daily. 09/29/22   Hosie Poisson, MD  pantoprazole (PROTONIX) 40 MG tablet Take 1 tablet (40 mg total) by mouth daily. 09/29/22   Hosie Poisson, MD  thiamine (VITAMIN B1) 100 MG tablet Take 1 tablet (100 mg total) by mouth daily. Patient taking differently: Take 100 mg by mouth 2 (two) times a week. 09/17/22   Patrecia Pour, MD  ADVAIR [facility] 230-21 MCG/ACT inhaler Inhale 2 puffs into the lungs 2 (two) times daily. 04/16/22 09/17/22  Shelly Coss, MD     Critical care time: 61 min    Richardson Landry Zehra Rucci ACNP Acute Care Nurse Practitioner [place] Please consult Amion 10/15/2022, 9:12 AM

## 2022-10-15 NOTE — Progress Notes (Signed)
[place] ED MD d/t noted pt is "twitching" , pt arouses but AMS.  Received order for ABG.  CCM at bedside- per CCM they witnessed pt seemed to be aspirating, and requested removal of bipap after I drew ABG, and requested pt be placed on [place].  Pt placed on 5 lpm, sat 97% and tol well.    Once ABG was processed, noted Panic/critical results on ABG and given to [facility] Minor, NP, at that time pt was prepped for intubation.  See CCM intubation note.  Pt appeared to tol well, sat stable t/o.  ICU RT given report.

## 2022-10-15 NOTE — Procedures (Signed)
Intubation Procedure Note [name] [id number] 1956/01/30  Procedure: Intubation Indications: Respiratory insufficiency  Procedure Details Consent: Unable to obtain consent because of emergent medical necessity. Time Out: Verified patient identification, verified procedure, site/side was marked, verified correct patient position, special equipment/implants available, medications/allergies/relevent history reviewed, required imaging and test results available.  Performed  MAC and 4 Medications:  Fentanyl 100 mcg Etomidate 20 mg Versed 2mg  NMB rocuronium 100 mg   Evaluation Hemodynamic Status: BP stable throughout; O2 sats: stable throughout Patient's Current Condition: stable Complications: No apparent complications Patient did tolerate procedure well. Chest X-ray ordered to verify placement.  CXR: pending.   Richardson Landry Dontai Pember ACNP [facility] PCCM Pager [phone] till 3 pm If no answer page [phone] 10/15/2022, 10:03 AM

## 2022-10-15 NOTE — Progress Notes (Signed)
Pharmacy Antibiotic Note  Ronald [name] is a 67 y.o. male admitted on 10/15/2022 with  aspiration pneumonia .  Pharmacy has been consulted for Unasyn dosing. Based on historical weights and visual exam, dosing off of 70 kg giving CrCl >100 mL/min. WBC elevated at 15.8, afebrile, Scr of 0.51 appears to be at baseline.   Plan: Unasyn 3g IV q6h F/u C&S, s/sx improvement, renal function, LOT      Temp (24hrs), Avg:97.7 F (36.5 C), Min:97.7 F (36.5 C), Max:97.7 F (36.5 C)  Recent Labs  Lab 10/15/22 0636  WBC 15.8*  CREATININE 0.51*    CrCl cannot be calculated (Unknown ideal weight.).    No Known Allergies  Antimicrobials this admission: 2/2 Unasyn >>   Dose adjustments this admission:  Microbiology results: 2/2 BCx: sent 2/2 trach aspirate Cx: sent 2/2 RVP - neg   Thank you for allowing pharmacy to be a part of this patient's care.  Eliseo Gum, PharmD PGY1 Pharmacy Resident   10/15/2022  9:46 AM

## 2022-10-16 DIAGNOSIS — J9602 Acute respiratory failure with hypercapnia: Secondary | ICD-10-CM | POA: Diagnosis not present

## 2022-10-16 DIAGNOSIS — J9601 Acute respiratory failure with hypoxia: Secondary | ICD-10-CM | POA: Diagnosis not present

## 2022-10-16 DIAGNOSIS — J1569 Pneumonia due to other gram-negative bacteria: Secondary | ICD-10-CM | POA: Diagnosis not present

## 2022-10-16 LAB — BASIC METABOLIC PANEL
Anion gap: 7 (ref 5–15)
Anion gap: 9 (ref 5–15)
Anion gap: 8 (ref 5–15)
Anion gap: 6 (ref 5–15)
BUN: 7 mg/dL — ABNORMAL LOW (ref 8–23)
BUN: 7 mg/dL — ABNORMAL LOW (ref 8–23)
BUN: 7 mg/dL — ABNORMAL LOW (ref 8–23)
BUN: 8 mg/dL (ref 8–23)
CO2: 30 mmol/L (ref 22–32)
CO2: 31 mmol/L (ref 22–32)
CO2: 30 mmol/L (ref 22–32)
CO2: 32 mmol/L (ref 22–32)
Calcium: 8.9 mg/dL (ref 8.9–10.3)
Calcium: 8.7 mg/dL — ABNORMAL LOW (ref 8.9–10.3)
Calcium: 8.2 mg/dL — ABNORMAL LOW (ref 8.9–10.3)
Calcium: 8.4 mg/dL — ABNORMAL LOW (ref 8.9–10.3)
Chloride: 88 mmol/L — ABNORMAL LOW (ref 98–111)
Chloride: 87 mmol/L — ABNORMAL LOW (ref 98–111)
Chloride: 90 mmol/L — ABNORMAL LOW (ref 98–111)
Chloride: 90 mmol/L — ABNORMAL LOW (ref 98–111)
Creatinine, Ser: 0.58 mg/dL — ABNORMAL LOW (ref 0.61–1.24)
Creatinine, Ser: 0.54 mg/dL — ABNORMAL LOW (ref 0.61–1.24)
Creatinine, Ser: 0.69 mg/dL (ref 0.61–1.24)
Creatinine, Ser: 0.63 mg/dL (ref 0.61–1.24)
GFR, Estimated: >60 mL/min (ref >60)
GFR, Estimated: >60 mL/min (ref >60)
GFR, Estimated: >60 mL/min (ref >60)
GFR, Estimated: >60 mL/min (ref >60)
Glucose, Bld: 132 mg/dL — ABNORMAL HIGH (ref 70–99)
Glucose, Bld: 114 mg/dL — ABNORMAL HIGH (ref 70–99)
Glucose, Bld: 132 mg/dL — ABNORMAL HIGH (ref 70–99)
Glucose, Bld: 126 mg/dL — ABNORMAL HIGH (ref 70–99)
Potassium: 3.9 mmol/L (ref 3.5–5.1)
Potassium: 3.9 mmol/L (ref 3.5–5.1)
Potassium: 3.8 mmol/L (ref 3.5–5.1)
Potassium: 3.9 mmol/L (ref 3.5–5.1)
Sodium: 125 mmol/L — ABNORMAL LOW (ref 135–145)
Sodium: 127 mmol/L — ABNORMAL LOW (ref 135–145)
Sodium: 128 mmol/L — ABNORMAL LOW (ref 135–145)
Sodium: 128 mmol/L — ABNORMAL LOW (ref 135–145)

## 2022-10-16 LAB — TRIGLYCERIDES: Triglycerides: 58 mg/dL (ref <150)

## 2022-10-16 LAB — GLUCOSE, CAPILLARY
Glucose-Capillary: 123 mg/dL — ABNORMAL HIGH (ref 70–99)
Glucose-Capillary: 125 mg/dL — ABNORMAL HIGH (ref 70–99)
Glucose-Capillary: 135 mg/dL — ABNORMAL HIGH (ref 70–99)
Glucose-Capillary: 136 mg/dL — ABNORMAL HIGH (ref 70–99)
Glucose-Capillary: 162 mg/dL — ABNORMAL HIGH (ref 70–99)
Glucose-Capillary: 87 mg/dL (ref 70–99)

## 2022-10-16 LAB — CBC
HCT: 25.7 % — ABNORMAL LOW (ref 39.0–52.0)
Hemoglobin: 8.8 g/dL — ABNORMAL LOW (ref 13.0–17.0)
MCH: 31.4 pg (ref 26.0–34.0)
MCHC: 34.2 g/dL (ref 30.0–36.0)
MCV: 91.8 fL (ref 80.0–100.0)
Platelets: 418 10*3/uL — ABNORMAL HIGH (ref 150–400)
RBC: 2.8 MIL/uL — ABNORMAL LOW (ref 4.22–5.81)
RDW: 15.9 % — ABNORMAL HIGH (ref 11.5–15.5)
WBC: 14.7 10*3/uL — ABNORMAL HIGH (ref 4.0–10.5)
nRBC: 0 % (ref 0.0–0.2)

## 2022-10-16 LAB — MAGNESIUM: Magnesium: 1.5 mg/dL — ABNORMAL LOW (ref 1.7–2.4)

## 2022-10-16 LAB — PHOSPHORUS: Phosphorus: 2.4 mg/dL — ABNORMAL LOW (ref 2.5–4.6)

## 2022-10-16 MED ORDER — SODIUM PHOSPHATES 45 MMOLE/15ML IV SOLN
15.0000 mmol | Freq: Once | INTRAVENOUS | Status: AC
  Administered 2022-10-16: 15 mmol via INTRAVENOUS
  Filled 2022-10-16: qty 5

## 2022-10-16 MED ORDER — BUDESONIDE 0.5 MG/2ML IN SUSP
0.5000 mg | Freq: Two times a day (BID) | RESPIRATORY_TRACT | Status: DC
  Administered 2022-10-16: 0.5 mg via RESPIRATORY_TRACT
  Filled 2022-10-16: qty 2

## 2022-10-16 MED ORDER — ALBUTEROL SULFATE (2.5 MG/3ML) 0.083% IN NEBU: 2.5000 mg | INHALATION_SOLUTION | RESPIRATORY_TRACT | Status: DC | PRN

## 2022-10-16 MED ORDER — MAGNESIUM SULFATE 4 GM/100ML IV SOLN
4.0000 g | Freq: Once | INTRAVENOUS | Status: AC
  Administered 2022-10-16: 4 g via INTRAVENOUS
  Filled 2022-10-16: qty 100

## 2022-10-16 MED ORDER — ORAL CARE MOUTH RINSE: 15.0000 mL | OROMUCOSAL | Status: DC | PRN

## 2022-10-16 MED ORDER — ARFORMOTEROL TARTRATE 15 MCG/2ML IN NEBU
15.0000 ug | INHALATION_SOLUTION | Freq: Two times a day (BID) | RESPIRATORY_TRACT | Status: DC
  Administered 2022-10-16: 15 ug via RESPIRATORY_TRACT
  Filled 2022-10-16: qty 2

## 2022-10-16 MED ORDER — FLUTICASONE FUROATE-VILANTEROL 100-25 MCG/ACT IN AEPB
1.0000 | INHALATION_SPRAY | Freq: Every day | RESPIRATORY_TRACT | Status: DC
  Administered 2022-10-17 – 2022-12-03 (×44): 1 via RESPIRATORY_TRACT
  Filled 2022-10-16 (×8): qty 28

## 2022-10-16 MED ORDER — PANTOPRAZOLE SODIUM 40 MG PO TBEC
40.0000 mg | DELAYED_RELEASE_TABLET | Freq: Every day | ORAL | Status: DC
  Administered 2022-10-17 – 2022-12-03 (×46): 40 mg via ORAL
  Filled 2022-10-16 (×48): qty 1

## 2022-10-16 NOTE — Progress Notes (Signed)
Pt had a choking spell while eating dinner and this RN helped pt w/ clearing airway. Diet changed to nectar thick and speech consulted

## 2022-10-16 NOTE — Progress Notes (Signed)
Tracheal aspirate sent to lab

## 2022-10-16 NOTE — Progress Notes (Signed)
NAME:  Ronald [name], MRN:  [id number], DOB:  [birth year], LOS: 1 ADMISSION DATE:  10/15/2022, CONSULTATION DATE:  10/15/2022 REFERRING MD:  Dr. Christy Gentles, CHIEF COMPLAINT:  Acute Hypoxic/Hypercarbic Respiratory Failure   History of Present Illness:  67 year old male who is known EtOH abuse cocaine abuse noncompliance presents with altered mental status along with noncompliance. He was recently admitted and discharged with concern that he needed to go to a skilled nursing facility for which he refused. He now presents with a sodium of 125, altered mental status hypoxic and hypercarbic and noted to be aspirating while on noninvasive mechanical ventilatory support requiring intubation.   Admitted to ICU. EEG negative for seizures.   Pertinent  Medical History  ETOH, Cocaine , COPD, PVD s/p Left BKA, HTN   Significant Hospital Events: Including procedures, antibiotic start and stop dates in addition to other pertinent events   Presents to ED 1/2 > requiring intubation. EEG negative for seizures, with continuous slowing   Interim History / Subjective:  This AM on low dose propofol. Awake, following commands   Objective   Blood pressure (!) 142/73, pulse (!) 109, temperature 98.3 F (36.8 C), temperature source Oral, resp. rate (!) 22, height 5\' 7"  (1.702 m), weight 59 kg, SpO2 100 %.    Vent Mode: PRVC FiO2 (%):  [35 %-100 %] 40 % Set Rate:  [18 bmp-20 bmp] 20 bmp Vt Set:  [520 mL] 520 mL PEEP:  [5 cmH20-8 cmH20] 8 cmH20 Plateau Pressure:  [14 cmH20-19 cmH20] 18 cmH20   Intake/Output Summary (Last 24 hours) at 10/16/2022 0732 Last data filed at 10/16/2022 0600 Gross per 24 hour  Intake 2286.57 ml  Output 1900 ml  Net 386.57 ml   Filed Weights   10/15/22 1200 10/16/22 0356  Weight: 59 kg 59 kg    Examination: General: Adult male on vent, no distress noted  HENT: ETT/OG in place  Lungs: Coarse breath sounds, vent assisted breaths Cardiovascular: RRR, HR 97, no mRG Abdomen: soft,  non-tender, active bowel sounds  Extremities: wound to right heel, left BKA Neuro: alert, follows commands  GU: Foley in place   Resolved Hospital Problem list     Assessment & Plan:   Acute hypercarbic/hypoxic respiratory failure in the setting of metabolic disarray probably chronic EtOH abuse hyponatremia H/O COPD  Plan - Continue Vent Support  - PS as tolerated >> currently on 10/5  - Continue scheduled nebs  - Titrate Propofol for RASS goal 0/-1    Acute on Chronic Hyponatremia in the setting of copious beer consumption - on admit sodium 112 Plan - Trend sodium. (Currently 124)   Leucocytosis, likely reactive  -CXR with no consolidation  - MRSA PCR +  - RVP negative  Plan  - Trend WBC and fever curve - Follow Culture Data - Unasyn for now   Hypomagnesia  Plan - Repeat Pending    Diabetes mellitus Plan - Sliding scale insulin   Chronic EtOH abuse with concern for withdrawal Cocaine Abuse  Plan - Folic acid - Thiamine - CIWA protocol with PRN ativan once extubation  - Consider psychiatric evaluation for possible involuntary commitment   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 Monitor   Best Practice (right click and "Reselect all SmartList Selections" daily)   Diet/type: NPO DVT prophylaxis: prophylactic heparin  GI prophylaxis: PPI Lines: N/A Foley:  Yes, and it is still needed Code Status:  full code Last date of multidisciplinary goals of care discussion [pending]  Labs  CBC: Recent Labs  Lab 10/15/22 0636 10/15/22 0642 10/15/22 0903 10/15/22 1148  WBC 15.8*  --   --   --   HGB 11.2* 12.6* 11.2* 11.9*  HCT 31.1* 37.0* 33.0* 35.0*  MCV 88.9  --   --   --   PLT 423*  --   --   --     Basic Metabolic Panel: Recent Labs  Lab 10/15/22 0636 10/15/22 0642 10/15/22 0903 10/15/22 1135 10/15/22 1148 10/15/22 1627 10/15/22 2236  NA 112*   < > 112* 117* 118* 122* 124*  K 4.2   < > 3.6 3.8 3.4* 3.9 4.7  CL 71*  --   --  78*  --   80* 87*  CO2 28  --   --  22  --  25 26  GLUCOSE 81  --   --  117*  --  111* 122*  BUN 5*  --   --  5*  --  5* 5*  CREATININE 0.51*  --   --  0.57*  --  0.52* 0.54*  CALCIUM 8.8*  --   --  8.5*  --  8.7* 8.8*  MG  --   --   --   --   --  1.4*  --   PHOS  --   --   --   --   --  3.5  --    < > = values in this interval not displayed.   GFR: Estimated Creatinine Clearance: 76.8 mL/min (A) (by C-G formula based on SCr of 0.54 mg/dL (L)). Recent Labs  Lab 10/15/22 0636 10/15/22 1344  PROCALCITON  --  <0.10  WBC 15.8*  --     Liver Function Tests: Recent Labs  Lab 10/15/22 1135  AST 21  ALT 10  ALKPHOS 91  BILITOT 1.4*  PROT 7.1  ALBUMIN 3.8   No results for input(s): "LIPASE", "AMYLASE" in the last 168 hours. No results for input(s): "AMMONIA" in the last 168 hours.  ABG    Component Value Date/Time   PHART 7.253 (L) 10/15/2022 1148   PCO2ART 62.4 (H) 10/15/2022 1148   PO2ART 76 (L) 10/15/2022 1148   HCO3 27.5 10/15/2022 1148   TCO2 29 10/15/2022 1148   ACIDBASEDEF 1.0 10/15/2022 1148   O2SAT 92 10/15/2022 1148     Coagulation Profile: No results for input(s): "INR", "PROTIME" in the last 168 hours.  Cardiac Enzymes: No results for input(s): "CKTOTAL", "CKMB", "CKMBINDEX", "TROPONINI" in the last 168 hours.  HbA1C: Hgb A1c MFr Bld  Date/Time Value Ref Range Status  09/02/2022 09:03 PM 5.6 4.8 - 5.6 % Final    Comment:    (NOTE)         Prediabetes: 5.7 - 6.4         Diabetes: >6.4         Glycemic control for adults with diabetes: <7.0   11/09/2013 01:00 PM 5.9 (H) <5.7 % Final    Comment:    (NOTE)                                                                       According to the ADA Clinical Practice Recommendations for 2011, when HbA1c is used as  a screening test:  >=6.5%   Diagnostic of Diabetes Mellitus           (if abnormal result is confirmed) 5.7-6.4%   Increased risk of developing Diabetes Mellitus References:Diagnosis and  Classification of Diabetes Mellitus,Diabetes VQQV,9563,87(FIEPP 1):S62-S69 and Standards of Medical Care in         Diabetes - 2011,Diabetes IRJJ,8841,66 (Suppl 1):S11-S61.    CBG: Recent Labs  Lab 10/15/22 1533 10/15/22 1610 10/15/22 1931 10/15/22 2351 10/16/22 0329  GLUCAP 102* 122* 102* 138* 162*    Review of Systems:   Unable to review   Past Medical History:  He,  has a past medical history of Critical lower limb ischemia ([place]), ETOH abuse, Gout, and Hypertension.   Surgical History:   Past Surgical History:  Procedure Laterality Date   ABDOMINAL AORTAGRAM N/A 12/26/2013   Procedure: ABDOMINAL AORTAGRAM;  Surgeon: Serafina Mitchell, MD;  Location: [facility] CATH LAB;  Service: Cardiovascular;  Laterality: N/A;   AMPUTATION Left 12/28/2013   Procedure: LEFT BELOW KNEE AMPUTATION;  Surgeon: Newt Minion, MD;  Location: [place];  Service: Orthopedics;  Laterality: Left;   BELOW KNEE LEG AMPUTATION     Left   LEG SURGERY       Social History:   reports that he has been smoking cigarettes. He has a 20.00 pack-year smoking history. He has never used smokeless tobacco. He reports current alcohol use of about 120.0 standard drinks of alcohol per week. He reports current drug use. Drugs: Cocaine and Marijuana.   Family History:  His family history includes Diabetes Mellitus II in his mother. There is no history of CAD or Stroke.   Allergies No Known Allergies   Home Medications  Prior to Admission medications   Medication Sig Start Date End Date Taking? Authorizing Provider  albuterol (VENTOLIN HFA) 108 (90 Base) MCG/ACT inhaler Inhale 2 puffs into the lungs every 6 (six) hours as needed for wheezing or shortness of breath. 09/17/22   Patrecia Pour, MD  amLODipine (NORVASC) 10 MG tablet Take 1 tablet (10 mg total) by mouth daily. 09/17/22   Patrecia Pour, MD  fluticasone furoate-vilanterol (BREO ELLIPTA) 100-25 MCG/ACT AEPB Inhale 1 puff into the lungs daily. 09/17/22   Patrecia Pour, MD   folic acid (FOLVITE) 1 MG tablet Take 1 tablet (1 mg total) by mouth daily. 04/17/22   Shelly Coss, MD  guaiFENesin-dextromethorphan (ROBITUSSIN DM) 100-10 MG/5ML syrup Take 10 mLs by mouth every 4 (four) hours as needed for cough. 09/28/22   Hosie Poisson, MD  lisinopril (ZESTRIL) 10 MG tablet Take 1 tablet (10 mg total) by mouth daily. 09/17/22   Patrecia Pour, MD  Multiple Vitamin (MULTIVITAMIN WITH MINERALS) TABS tablet Take 1 tablet by mouth daily. 09/29/22   Hosie Poisson, MD  pantoprazole (PROTONIX) 40 MG tablet Take 1 tablet (40 mg total) by mouth daily. 09/29/22   Hosie Poisson, MD  thiamine (VITAMIN B1) 100 MG tablet Take 1 tablet (100 mg total) by mouth daily. Patient taking differently: Take 100 mg by mouth 2 (two) times a week. 09/17/22   Patrecia Pour, MD  ADVAIR [facility] 230-21 MCG/ACT inhaler Inhale 2 puffs into the lungs 2 (two) times daily. 04/16/22 09/17/22  Shelly Coss, MD     Critical care time: 55 minutes     CRITICAL CARE Performed by: Omar Person   Total critical care time: 55 minutes  Critical care time was exclusive of separately billable procedures and treating other patients.  Critical care was necessary to treat or prevent imminent or life-threatening deterioration.  Critical care was time spent personally by me on the following activities: development of treatment plan with patient and/or surrogate as well as nursing, discussions with consultants, evaluation of patient's response to treatment, examination of patient, obtaining history from patient or surrogate, ordering and performing treatments and interventions, ordering and review of laboratory studies, ordering and review of radiographic studies, pulse oximetry and re-evaluation of patient's condition.  Hayden Pedro, AGACNP-BC [facility]  PCCM Pgr: [phone]

## 2022-10-16 NOTE — Procedures (Signed)
Extubation Procedure Note  Patient Details:   Name: [name] DOB: 09-05-1956 MRN: [id number]   Airway Documentation:    Vent end date: 10/16/22 Vent end time: 1032   Evaluation  O2 sats: stable throughout Complications: No apparent complications Patient did tolerate procedure well. Bilateral Breath Sounds: Diminished, Rhonchi  Patient ectubated to 4L [place] per order. Cuff leak noted, follows commands. Able to speak post extubation.   Catha Brow 10/16/2022, 10:33 AM

## 2022-10-16 NOTE — Progress Notes (Signed)
This RN let NP know of pts Audit-C and CIWA. No new orders as pt already has PRN Ativan

## 2022-10-16 NOTE — Progress Notes (Signed)
Ronald [name] Progress Note Patient Name: [name] DOB: 12/09/1955 MRN: [id number]   Date of Service  10/16/2022  HPI/Events of Note  Presented with sodium 112.  Now up to 124.  eICU Interventions  Stop NS at 100 cc/hr     Intervention Category Intermediate Interventions: Electrolyte abnormality - evaluation and management  Mauri Brooklyn, P 10/16/2022, 3:55 AM

## 2022-10-16 NOTE — Progress Notes (Signed)
Pt become agitated and restless and began yelling @ this RN. Pt recently extubated and failed [facility] swallow screen (post 2 hr extubation) and this RN told pt he could not have any food until speech was involved or 30 mins pass to retry swallow screen.

## 2022-10-16 NOTE — Progress Notes (Addendum)
Pt passed [facility] swallow screen and regular diet order.

## 2022-10-16 NOTE — Evaluation (Signed)
Physical Therapy Evaluation Patient Details Name: Ronald [name] MRN: [id number] DOB: 06-Aug-1956 Today's Date: 10/16/2022  History of Present Illness  This is a 67 year old gentleman admitted 2/2 after being found down at home after a recent d/c from hospital where he refused SNF.  Pt was hyponatremic hypoxemic hypercarbic was placed on BiPAP in the emergency room.  Ultimately failed BiPAP requiring intubation mechanical ventilation 2/2--2/3.  PMH: polysubstance abuse, cocaine, alcohol use  Clinical Impression  Pt admitted with above diagnosis. Pt somewhat irritated and would only do bed level evaluation today. Pt reports he was independent at home PTA however pt was recently d/c'd to home after refusing SNF and found down at home.  Pt would benefit from SNF and might need this for living as a resident.  He has decr capacity for care for himself.  Will follow acutely.  Pt currently with functional limitations due to the deficits listed below (see PT Problem List). Pt will benefit from skilled PT to increase their independence and safety with mobility to allow discharge to the venue listed below.          Recommendations for follow up therapy are one component of a multi-disciplinary discharge planning process, led by the attending physician.  Recommendations may be updated based on patient status, additional functional criteria and insurance authorization.  Follow Up Recommendations Skilled nursing-short term rehab (<3 hours/day) Can patient physically be transported by private vehicle: No    Assistance Recommended at Discharge Frequent or constant Supervision/Assistance  Patient can return home with the following  Assistance with cooking/housework;Direct supervision/assist for financial management;Direct supervision/assist for medications management;Assist for transportation;A little help with walking and/or transfers;A little help with bathing/dressing/bathroom    Equipment Recommendations  Wheelchair (measurements PT);Wheelchair cushion (measurements PT)  Recommendations for Other Services       Functional Status Assessment Patient has had a recent decline in their functional status and demonstrates the ability to make significant improvements in function in a reasonable and predictable amount of time.     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  He was c/o about not eating and awaiting tray. Would only do exercises today and answer questions.    Transfers                   General transfer comment: TBA    Ambulation/Gait               General Gait Details: unable  Stairs            Wheelchair Mobility    Modified Rankin (Stroke Patients Only)       Balance       Sitting balance - Comments: Did not test       Standing balance comment: Did not test                             Pertinent Vitals/Pain Pain Assessment Pain Assessment: No/denies pain    Home Living Family/patient expects to be discharged to:: Private residence Living Arrangements: Non-relatives/Friends;Alone (rents 2 rooms to friends) Available  Help at Discharge: Available 24 hours/day Type of Home: House Home Access: Ramped entrance       Home Layout: One level Home Equipment: BSC/3in1;Wheelchair - manual Additional Comments: prosthetic doesnt fit per pt    Prior Function Prior Level of Function : Patient poor historian/Family not available             Mobility Comments: Reports he could transfer himself in and out of wc ADLs Comments: reports IND     Hand Dominance    Dominant Hand: Right    Extremity/Trunk Assessment   Upper Extremity Assessment Upper Extremity Assessment: Defer to OT evaluation    Lower Extremity Assessment Lower Extremity Assessment: RLE deficits/detail RLE Deficits / Details: reduced ankle mobility, pt states "I can't move my ankle"pt has functional ROM RLE Sensation: WNL LLE Deficits / Details: Healed L-BKA, pt has functional ROM LLE Sensation: WNL       Communication   Communication: No difficulties  Cognition Arousal/Alertness: Awake/alert Behavior During Therapy: Impulsive, Agitated Overall Cognitive Status: No family/caregiver present to determine baseline cognitive functioning Area of Impairment: Following commands, Safety/judgement, Problem solving                     Memory: Decreased recall of precautions Following Commands: Follows one step commands inconsistently, Follows multi-step commands inconsistently Safety/Judgement: Decreased awareness of safety, Decreased awareness of deficits Awareness: Emergent Problem Solving: Difficulty sequencing, Requires verbal cues          General Comments      Exercises General Exercises - Lower Extremity Ankle Circles/Pumps: AROM, Right, 5 reps, Supine Quad Sets: AROM, Right, 10 reps, Supine Heel Slides: AROM, Right, 10 reps, Supine Straight Leg Raises: AROM, Right, 10 reps, Supine   Assessment/Plan    PT Assessment Patient needs continued PT services  PT Problem List Decreased strength;Decreased range of motion;Decreased activity tolerance;Decreased balance;Decreased mobility;Decreased cognition;Decreased knowledge of use of DME;Decreased safety awareness;Decreased knowledge of precautions       PT Treatment Interventions DME instruction;Functional mobility training;Therapeutic activities;Therapeutic exercise;Balance training;Neuromuscular re-education;Cognitive remediation;Patient/family education;Stair training;Gait training;Wheelchair mobility  training    PT Goals (Current goals can be found in the Care Plan section)  Acute Rehab PT Goals Patient Stated Goal: To go home PT Goal Formulation: With patient Time For Goal Achievement: 10/30/22 Potential to Achieve Goals: Fair    Frequency Min 3X/week     Co-evaluation               AM-PAC PT "6 Clicks" Mobility  Outcome Measure Help needed turning from your back to your side while in a flat bed without using bedrails?: None Help needed moving from lying on your back to sitting on the side of a flat bed without using bedrails?: A Little Help needed moving to and from a bed to a chair (including a wheelchair)?: A Lot Help needed standing up from a chair using your arms (e.g., wheelchair or bedside chair)?: Total Help needed to walk in hospital room?: Total Help needed climbing 3-5 steps with a railing? : Total 6 Click Score: 12    End of Session Equipment Utilized During Treatment: Oxygen Activity Tolerance: Treatment limited secondary to agitation (pt yelling and irritable) Patient left: in bed;with call bell/phone within reach;with bed alarm set Nurse Communication: Mobility status;Need for lift equipment PT Visit Diagnosis: Other abnormalities of gait and mobility (R26.89);Muscle weakness (generalized) (M62.81);History of falling (Z91.81)    Time: 2355-7322 PT Time Calculation (min) (ACUTE ONLY): 15 min   Charges:  PT Evaluation $PT Eval Moderate Complexity: 1 Mod          Shayne Diguglielmo M,PT Acute Rehab Services [phone]   Alvira Philips 10/16/2022, 4:47 PM

## 2022-10-17 DIAGNOSIS — J9602 Acute respiratory failure with hypercapnia: Secondary | ICD-10-CM | POA: Diagnosis not present

## 2022-10-17 DIAGNOSIS — J9601 Acute respiratory failure with hypoxia: Secondary | ICD-10-CM | POA: Diagnosis not present

## 2022-10-17 DIAGNOSIS — J1569 Pneumonia due to other gram-negative bacteria: Secondary | ICD-10-CM | POA: Diagnosis not present

## 2022-10-17 LAB — BASIC METABOLIC PANEL
Anion gap: 9 (ref 5–15)
BUN: 6 mg/dL — ABNORMAL LOW (ref 8–23)
CO2: 32 mmol/L (ref 22–32)
Calcium: 8.3 mg/dL — ABNORMAL LOW (ref 8.9–10.3)
Chloride: 90 mmol/L — ABNORMAL LOW (ref 98–111)
Creatinine, Ser: 0.45 mg/dL — ABNORMAL LOW (ref 0.61–1.24)
GFR, Estimated: >60 mL/min (ref >60)
Glucose, Bld: 94 mg/dL (ref 70–99)
Potassium: 3.6 mmol/L (ref 3.5–5.1)
Sodium: 131 mmol/L — ABNORMAL LOW (ref 135–145)

## 2022-10-17 LAB — CBC
HCT: 26.7 % — ABNORMAL LOW (ref 39.0–52.0)
Hemoglobin: 9 g/dL — ABNORMAL LOW (ref 13.0–17.0)
MCH: 31.9 pg (ref 26.0–34.0)
MCHC: 33.7 g/dL (ref 30.0–36.0)
MCV: 94.7 fL (ref 80.0–100.0)
Platelets: 384 10*3/uL (ref 150–400)
RBC: 2.82 MIL/uL — ABNORMAL LOW (ref 4.22–5.81)
RDW: 16.8 % — ABNORMAL HIGH (ref 11.5–15.5)
WBC: 13.8 10*3/uL — ABNORMAL HIGH (ref 4.0–10.5)
nRBC: 0 % (ref 0.0–0.2)

## 2022-10-17 LAB — GLUCOSE, CAPILLARY
Glucose-Capillary: 89 mg/dL (ref 70–99)
Glucose-Capillary: 102 mg/dL — ABNORMAL HIGH (ref 70–99)
Glucose-Capillary: 140 mg/dL — ABNORMAL HIGH (ref 70–99)
Glucose-Capillary: 144 mg/dL — ABNORMAL HIGH (ref 70–99)
Glucose-Capillary: 132 mg/dL — ABNORMAL HIGH (ref 70–99)

## 2022-10-17 LAB — MAGNESIUM: Magnesium: 1.7 mg/dL (ref 1.7–2.4)

## 2022-10-17 LAB — PHOSPHORUS: Phosphorus: 4.2 mg/dL (ref 2.5–4.6)

## 2022-10-17 MED ORDER — MAGNESIUM SULFATE 4 GM/100ML IV SOLN
4.0000 g | Freq: Once | INTRAVENOUS | Status: AC
  Administered 2022-10-17: 4 g via INTRAVENOUS
  Filled 2022-10-17: qty 100

## 2022-10-17 MED ORDER — INSULIN ASPART 100 UNIT/ML IJ SOLN: 0.0000 [IU] | Freq: Three times a day (TID) | INTRAMUSCULAR | Status: DC

## 2022-10-17 MED ORDER — INSULIN ASPART 100 UNIT/ML IJ SOLN: 0.0000 [IU] | Freq: Four times a day (QID) | INTRAMUSCULAR | Status: DC

## 2022-10-17 MED ORDER — MUPIROCIN 2 % EX OINT: 1.0000 | TOPICAL_OINTMENT | Freq: Two times a day (BID) | CUTANEOUS | Status: DC

## 2022-10-17 MED ORDER — CHLORHEXIDINE GLUCONATE CLOTH 2 % EX PADS
6.0000 | MEDICATED_PAD | Freq: Every day | CUTANEOUS | Status: AC
  Administered 2022-10-19 – 2022-10-21 (×3): 6 via TOPICAL

## 2022-10-17 NOTE — Progress Notes (Signed)
Pt made NPO after having multiple choking spells on his food throughout the night(per night shift RN). Awaiting speech to consult pt.

## 2022-10-17 NOTE — Evaluation (Signed)
Clinical/Bedside Swallow Evaluation Patient Details  Name: Ronald [name] MRN: [id number] Date of Birth: 02/07/1956  Today's Date: 10/17/2022 Time: SLP Start Time (ACUTE ONLY): 1025 SLP Stop Time (ACUTE ONLY): 1127 SLP Time Calculation (min) (ACUTE ONLY): 62 min  Past Medical History:  Past Medical History:  Diagnosis Date   Critical lower limb ischemia ([place])    ETOH abuse    Gout    Hypertension    Past Surgical History:  Past Surgical History:  Procedure Laterality Date   ABDOMINAL AORTAGRAM N/A 12/26/2013   Procedure: ABDOMINAL Maxcine Ham;  Surgeon: Serafina Mitchell, MD;  Location: [facility] CATH LAB;  Service: Cardiovascular;  Laterality: N/A;   AMPUTATION Left 12/28/2013   Procedure: LEFT BELOW KNEE AMPUTATION;  Surgeon: Newt Minion, MD;  Location: [place];  Service: Orthopedics;  Laterality: Left;   BELOW KNEE LEG AMPUTATION     Left   LEG SURGERY     HPI:  This is a 67 year old gentleman admitted 2/2 after being found down at home after a recent d/c from hospital where he refused SNF.  Pt was hyponatremic hypoxemic hypercarbic was placed on BiPAP in the emergency room.  Ultimately failed BiPAP requiring intubation mechanical ventilation 2/2--2/3.  Passed [facility] swallow screen after extubation, then demonstrated consistent coughing with PO intake and was made NPO.  Pt consistently denies difficulty swallowing.  PMH: polysubstance abuse, cocaine, alcohol use. Pt had MBS 09/22/22 which was essentially functional with no notable aspiration.   Assessment / Plan / Recommendation  Clinical Impression  Pt known to SLP service from prior admissions.  Today, he is impulsive and irritable, denying difficulty swallowing and asking for eggs and sausage. He participated in clinical swallow assessment, marked by intermittent coughing that appeared to be precipitated by crackers and then recurred throughout assessment, obscuring further PO trials. Nectar -thick liquids did not elicit cough, but pt was  reluctant to drink them.  Any discussion of coughing furthered his irritability and denial that his coughing was related to PO intake. Encouraged him to try dysphagia 3/nectars temporarily. After breakfast tray arrived, pt was observed to have continued explosive coughing episodes at intervals while eating.  He became angry and was not receptive to discussions about his swallowing nor risk for airway obstruction. Upon discussion with Dr. Valeta Harms, recommend continuing PO diet  according to pt's demands.     SLP Visit Diagnosis: Dysphagia, unspecified (R13.10)    Aspiration Risk  Unknown but present    Diet Recommendation   Dysphagia 3, nectar thick liquids  Medication Administration: Whole meds with puree    Other  Recommendations Oral Care Recommendations: Oral care BID    Recommendations for follow up therapy are one component of a multi-disciplinary discharge planning process, led by the attending physician.  Recommendations may be updated based on patient status, additional functional criteria and insurance authorization.  Follow up Recommendations No SLP follow up              Frequency and Duration min 1 x/week  1 week       Prognosis Prognosis for Safe Diet Advancement: Fair Barriers to Reach Goals: Behavior      Swallow Study   General Date of Onset: 10/15/22 HPI: This is a 67 year old gentleman admitted 2/2 after being found down at home after a recent d/c from hospital where he refused SNF.  Pt was hyponatremic hypoxemic hypercarbic was placed on BiPAP in the emergency room.  Ultimately failed BiPAP requiring intubation mechanical ventilation 2/2--2/3.  [employer]  swallow screen after extubation, then demonstrated consistent coughing with PO intake and was made NPO.  Pt consistently denies difficulty swallowing.  PMH: polysubstance abuse, cocaine, alcohol use. Pt had MBS 09/22/22 which was essentially normal. Type of Study: Bedside Swallow Evaluation Previous Swallow  Assessment: 09/22/22 MBS - onset of swallow at pyriforms with thin liquids; no aspiration; recommended regular solids/thin liquids Diet Prior to this Study: NPO Temperature Spikes Noted: No Respiratory Status: Nasal cannula History of Recent Intubation: Yes Length of Intubations (days): 1 days Date extubated: 10/16/22 Behavior/Cognition: Impulsive Oral Cavity Assessment: Within Functional Limits Oral Care Completed by SLP: No Oral Cavity - Dentition: Missing dentition Vision: Functional for self-feeding Self-Feeding Abilities: Able to feed self Patient Positioning: Upright in bed Baseline Vocal Quality: Normal Volitional Cough: Strong Volitional Swallow: Able to elicit    Oral/Motor/Sensory Function Overall Oral Motor/Sensory Function: Within functional limits   Ice Chips Ice chips: Within functional limits   Thin Liquid Thin Liquid: Impaired Presentation: Cup;Straw Pharyngeal  Phase Impairments: Cough - Immediate    Nectar Thick Nectar Thick Liquid: Within functional limits   Honey Thick Honey Thick Liquid: Not tested   Puree Puree: Within functional limits   Solid     Solid: Impaired Pharyngeal Phase Impairments: Cough - Delayed      Ronald [name] 10/17/2022,11:40 AM  Ronald Bamberg L. Tivis Ringer, MA CCC/SLP Clinical Specialist - [place] Office number [phone]

## 2022-10-17 NOTE — Progress Notes (Signed)
NAME:  Ronald [name], MRN:  [id number], DOB:  01/23/1956, LOS: 2 ADMISSION DATE:  10/15/2022, CONSULTATION DATE:  10/15/2022 REFERRING MD:  Dr. Christy Gentles, CHIEF COMPLAINT:  Acute Hypoxic/Hypercarbic Respiratory Failure   History of Present Illness:  67 year old male who is known EtOH abuse cocaine abuse noncompliance presents with altered mental status along with noncompliance. He was recently admitted and discharged with concern that he needed to go to a skilled nursing facility for which he refused. He now presents with a sodium of 125, altered mental status hypoxic and hypercarbic and noted to be aspirating while on noninvasive mechanical ventilatory support requiring intubation.   Admitted to ICU. EEG negative for seizures.   Pertinent  Medical History  ETOH, Cocaine , COPD, PVD s/p Left BKA, HTN   Significant Hospital Events: Including procedures, antibiotic start and stop dates in addition to other pertinent events   Presents to ED 1/2 > requiring intubation. EEG negative for seizures, with continuous slowing  2/3 Extubated   Interim History / Subjective:  Overnight with choking/coughing with oral intake, made NPO. Awaiting speech therapy evaluation.   Objective   Blood pressure 115/70, pulse 86, temperature 97.9 F (36.6 C), temperature source Oral, resp. rate 10, height 5\' 7"  (1.702 m), weight 60.9 kg, SpO2 99 %.    Vent Mode: PSV;CPAP FiO2 (%):  [40 %] 40 % PEEP:  [5 cmH20] 5 cmH20 Pressure Support:  [10 cmH20] 10 cmH20   Intake/Output Summary (Last 24 hours) at 10/17/2022 3419 Last data filed at 10/17/2022 0600 Gross per 24 hour  Intake 1036.39 ml  Output 1595 ml  Net -558.61 ml   Filed Weights   10/15/22 1200 10/16/22 0356 10/17/22 0500  Weight: 59 kg 59 kg 60.9 kg    Examination: General: Adult male lying in bed, no distress  HENT: Dry MM  Lungs: Coarse breath sounds, no accessory muscle use  Cardiovascular: RRR, HR 80, no mRG Abdomen: soft, non-tender, active  bowel sounds  Extremities: wound to right heel, left BKA Neuro: alert, follows commands  GU: intact   Resolved Hospital Problem list     Assessment & Plan:   Acute hypercarbic/hypoxic respiratory failure in the setting of metabolic disarray probably chronic EtOH abuse hyponatremia H/O COPD  - Extubated 2/3  Plan - Titrate Supplemental Oxygen for saturation goal >92  - Continue scheduled nebs  - Encourage pulmonary hygiene    Acute on Chronic Hyponatremia in the setting of copious beer consumption - on admit sodium 112 Plan - Trend sodium. (Currently 131)   Leucocytosis, likely reactive >> improving  -CXR with no consolidation  - MRSA PCR +  - RVP negative  Plan  - Trend WBC and fever curve - Follow Culture Data - Unasyn for now   Dysphagia  - Speech Eval 1/11 >> Rec Regular diet, Thin Liquids, Seated upright 90 and 30-60 minutes after meal  - 05/2022 Esophagram with dysmotility  Plan - NPO while waiting for re-speech evaluate   Hypomagnesia  Plan - Giving Mag now    Diabetes mellitus Plan - Sliding scale insulin   Chronic EtOH abuse with concern for withdrawal Cocaine Abuse  Plan - Folic acid - Thiamine - CIWA protocol with PRN - Consider psychiatric evaluation for possible involuntary commitment   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 Monitor   Best Practice (right click and "Reselect all SmartList Selections" daily)   Diet/type: NPO DVT prophylaxis: prophylactic heparin  GI prophylaxis: PPI Lines: N/A Foley:  Yes, and it is  still needed Code Status:  full code Last date of multidisciplinary goals of care discussion [pending]  Labs   CBC: Recent Labs  Lab 10/15/22 0636 10/15/22 0642 10/15/22 0903 10/15/22 1148 10/16/22 0718 10/17/22 0602  WBC 15.8*  --   --   --  14.7* 13.8*  HGB 11.2* 12.6* 11.2* 11.9* 8.8* 9.0*  HCT 31.1* 37.0* 33.0* 35.0* 25.7* 26.7*  MCV 88.9  --   --   --  91.8 94.7  PLT 423*  --   --   --  418* 384     Basic Metabolic Panel: Recent Labs  Lab 10/15/22 1627 10/15/22 2236 10/16/22 0718 10/16/22 1332 10/16/22 1919 10/16/22 2300 10/17/22 0602  NA 122*   < > 125* 127* 128* 128* 131*  K 3.9   < > 3.9 3.9 3.8 3.9 3.6  CL 80*   < > 88* 87* 90* 90* 90*  CO2 25   < > 30 31 30  32 32  GLUCOSE 111*   < > 132* 114* 132* 126* 94  BUN 5*   < > 7* 7* 7* 8 6*  CREATININE 0.52*   < > 0.58* 0.54* 0.69 0.63 0.45*  CALCIUM 8.7*   < > 8.9 8.7* 8.2* 8.4* 8.3*  MG 1.4*  --  1.5*  --   --   --  1.7  PHOS 3.5  --  2.4*  --   --   --  4.2   < > = values in this interval not displayed.   GFR: Estimated Creatinine Clearance: 79.3 mL/min (A) (by C-G formula based on SCr of 0.45 mg/dL (L)). Recent Labs  Lab 10/15/22 0636 10/15/22 1344 10/16/22 0718 10/17/22 0602  PROCALCITON  --  <0.10  --   --   WBC 15.8*  --  14.7* 13.8*    Liver Function Tests: Recent Labs  Lab 10/15/22 1135  AST 21  ALT 10  ALKPHOS 91  BILITOT 1.4*  PROT 7.1  ALBUMIN 3.8   No results for input(s): "LIPASE", "AMYLASE" in the last 168 hours. No results for input(s): "AMMONIA" in the last 168 hours.  ABG    Component Value Date/Time   PHART 7.253 (L) 10/15/2022 1148   PCO2ART 62.4 (H) 10/15/2022 1148   PO2ART 76 (L) 10/15/2022 1148   HCO3 27.5 10/15/2022 1148   TCO2 29 10/15/2022 1148   ACIDBASEDEF 1.0 10/15/2022 1148   O2SAT 92 10/15/2022 1148     Coagulation Profile: No results for input(s): "INR", "PROTIME" in the last 168 hours.  Cardiac Enzymes: No results for input(s): "CKTOTAL", "CKMB", "CKMBINDEX", "TROPONINI" in the last 168 hours.  HbA1C: Hgb A1c MFr Bld  Date/Time Value Ref Range Status  09/02/2022 09:03 PM 5.6 4.8 - 5.6 % Final    Comment:    (NOTE)         Prediabetes: 5.7 - 6.4         Diabetes: >6.4         Glycemic control for adults with diabetes: <7.0   11/09/2013 01:00 PM 5.9 (H) <5.7 % Final    Comment:    (NOTE)  According to the ADA Clinical Practice Recommendations for 2011, when HbA1c is used as a screening test:  >=6.5%   Diagnostic of Diabetes Mellitus           (if abnormal result is confirmed) 5.7-6.4%   Increased risk of developing Diabetes Mellitus References:Diagnosis and Classification of Diabetes Mellitus,Diabetes IOXB,3532,99(MEQAS 1):S62-S69 and Standards of Medical Care in         Diabetes - 2011,Diabetes TMHD,6222,97 (Suppl 1):S11-S61.    CBG: Recent Labs  Lab 10/16/22 1125 10/16/22 1539 10/16/22 1924 10/16/22 2331 10/17/22 0314  GLUCAP 123* 87 125* 135* 89    Review of Systems:   Unable to review   Past Medical History:  He,  has a past medical history of Critical lower limb ischemia ([place]), ETOH abuse, Gout, and Hypertension.   Surgical History:   Past Surgical History:  Procedure Laterality Date   ABDOMINAL AORTAGRAM N/A 12/26/2013   Procedure: ABDOMINAL AORTAGRAM;  Surgeon: Serafina Mitchell, MD;  Location: [facility] CATH LAB;  Service: Cardiovascular;  Laterality: N/A;   AMPUTATION Left 12/28/2013   Procedure: LEFT BELOW KNEE AMPUTATION;  Surgeon: Newt Minion, MD;  Location: [place];  Service: Orthopedics;  Laterality: Left;   BELOW KNEE LEG AMPUTATION     Left   LEG SURGERY       Social History:   reports that he has been smoking cigarettes. He has a 20.00 pack-year smoking history. He has never used smokeless tobacco. He reports current alcohol use of about 120.0 standard drinks of alcohol per week. He reports current drug use. Drugs: Cocaine and Marijuana.   Family History:  His family history includes Diabetes Mellitus II in his mother. There is no history of CAD or Stroke.   Allergies No Known Allergies   Home Medications  Prior to Admission medications   Medication Sig Start Date End Date Taking? Authorizing Provider  albuterol (VENTOLIN HFA) 108 (90 Base) MCG/ACT inhaler Inhale 2 puffs into the lungs every 6 (six) hours as needed for wheezing or  shortness of breath. 09/17/22   Patrecia Pour, MD  amLODipine (NORVASC) 10 MG tablet Take 1 tablet (10 mg total) by mouth daily. 09/17/22   Patrecia Pour, MD  fluticasone furoate-vilanterol (BREO ELLIPTA) 100-25 MCG/ACT AEPB Inhale 1 puff into the lungs daily. 09/17/22   Patrecia Pour, MD  folic acid (FOLVITE) 1 MG tablet Take 1 tablet (1 mg total) by mouth daily. 04/17/22   Shelly Coss, MD  guaiFENesin-dextromethorphan (ROBITUSSIN DM) 100-10 MG/5ML syrup Take 10 mLs by mouth every 4 (four) hours as needed for cough. 09/28/22   Hosie Poisson, MD  lisinopril (ZESTRIL) 10 MG tablet Take 1 tablet (10 mg total) by mouth daily. 09/17/22   Patrecia Pour, MD  Multiple Vitamin (MULTIVITAMIN WITH MINERALS) TABS tablet Take 1 tablet by mouth daily. 09/29/22   Hosie Poisson, MD  pantoprazole (PROTONIX) 40 MG tablet Take 1 tablet (40 mg total) by mouth daily. 09/29/22   Hosie Poisson, MD  thiamine (VITAMIN B1) 100 MG tablet Take 1 tablet (100 mg total) by mouth daily. Patient taking differently: Take 100 mg by mouth 2 (two) times a week. 09/17/22   Patrecia Pour, MD  ADVAIR [facility] 230-21 MCG/ACT inhaler Inhale 2 puffs into the lungs 2 (two) times daily. 04/16/22 09/17/22  Shelly Coss, MD     Time Spent: 50 minutes     Hayden Pedro, AGACNP-BC [facility]  PCCM Pgr: [phone]

## 2022-10-17 NOTE — Progress Notes (Signed)
Report given to 5N RN Ellard Artis). Pt safe for transport and vitals WNL per order set.

## 2022-10-17 NOTE — TOC Initial Note (Addendum)
Transition of Care [facility]) - Initial/Assessment Note    Patient Details  Name: Ronald [name] MRN: [id number] Date of Birth: November 20, 1955  Transition of Care [facility]) CM/SW Contact:    Elliot Gurney [place], [place] Phone Number: 10/17/2022, 1:59 PM  Clinical Narrative:                 This Education officer, museum met with patient at bedside. SNF recommendation discussed. Patient refuses SNF and states plan to return to his address on [address]. Patient states that he has a wheelchair in the home, no other DME  or services. Patient states that his sister [name] helps with transportation to medical appointments, he also has a friend that assists with general errands. Patient continued to adamantly refuse SNF, often yelling out "I am going back home". Patient agreeable to [facility].  Transition of Care to continue to follow   [place], LCSW Transition of Care    Expected Discharge Plan: Skilled Nursing Facility Barriers to Discharge: Continued Medical Work up   Patient Goals and CMS Choice Patient states their goals for this hospitalization and ongoing recovery are:: 'To get out of here"          Expected Discharge Plan and Services In-house Referral: Clinical Social Work     Living arrangements for the past 2 months: Single Family Home                                      Prior Living Arrangements/Services Living arrangements for the past 2 months: Single Family Home Lives with:: Self Patient language and need for interpreter reviewed:: Yes Do you feel safe going back to the place where you live?: Yes      Need for Family Participation in Patient Care: Yes (Comment) Care giver support system in place?: Yes (comment)   Criminal Activity/Legal Involvement Pertinent to Current Situation/Hospitalization: No - Comment as needed  Activities of Daily Living Home Assistive Devices/Equipment: Prosthesis ADL Screening (condition at time of admission) Patient's cognitive ability adequate to  safely complete daily activities?: Yes Is the patient deaf or have difficulty hearing?: No Does the patient have difficulty seeing, even when wearing glasses/contacts?: No Does the patient have difficulty concentrating, remembering, or making decisions?: No Patient able to express need for assistance with ADLs?: Yes Does the patient have difficulty dressing or bathing?: Yes Independently performs ADLs?: No Does the patient have difficulty walking or climbing stairs?: Yes Weakness of Legs: Right Weakness of Arms/Hands: Both  Permission Sought/Granted            Permission granted to share info w Relationship: Cymthia Louis-sister  Permission granted to share info w Contact Information: [phone]  Emotional Assessment Appearance:: Appears older than stated age Attitude/Demeanor/Rapport: Angry Affect (typically observed): Agitated Orientation: : Oriented to Self, Oriented to Place, Oriented to  Time, Oriented to Situation Alcohol / Substance Use: Not Applicable Psych Involvement: No (comment)  Admission diagnosis:  Respiratory failure ([place]) [J96.90] Alcohol abuse [F10.10] Hyponatremia [E87.1] Tobacco abuse [Z72.0] COPD exacerbation ([place]) [J44.1] Acute respiratory failure with hypoxia ([place]) [J96.01] Patient Active Problem List   Diagnosis Date Noted   Respiratory failure ([place]) 10/15/2022   Chronic hypercapnic respiratory failure ([place]) 09/19/2022   Protein-calorie malnutrition, severe 09/04/2022   Acute hypercapnic respiratory failure ([place]) 09/02/2022   Hx of BKA, left ([place]) 09/02/2022   Hypotension 09/02/2022   Right bundle branch block 09/02/2022   Acute respiratory failure ([place]) 09/02/2022  Hyponatremia 03/26/2019   Altered mental status 03/26/2019   Acute hypoxemic respiratory failure ([place]) 03/26/2019   Dysphagia 03/26/2019   Acute encephalopathy 03/18/2019   COPD exacerbation ([place]) 08/29/2017   Subdural hematoma ([place]) 06/19/2014   Critical lower limb ischemia  ([place]) 12/25/2013   Osteomyelitis ([place]) 11/06/2013   HTN (hypertension), benign 11/06/2013   Alcohol abuse 11/06/2013   Osteomyelitis of left foot ([place]) 11/06/2013   COPD (chronic obstructive pulmonary disease) ([place]) 11/06/2013   Accelerated hypertension 09/25/2013   Hypokalemia 09/25/2013   Abscess of left foot 09/21/2013   Cellulitis of left foot 09/19/2013   PCP:  Demetrios Isaacs, FNP Pharmacy:   [id number] - [place], [place] [address] [place] [address] Phone: [phone] Fax: [phone]     Social Determinants of Health (SDOH) Social History: SDOH Screenings   Food Insecurity: No Food Insecurity (10/17/2022)  Recent Concern: Food Insecurity - Food Insecurity Present (09/17/2022)  Housing: Medium Risk (10/17/2022)  Transportation Needs: Unmet Transportation Needs (10/17/2022)  Utilities: Not At Risk (10/17/2022)  Tobacco Use: High Risk (10/15/2022)   SDOH Interventions:     Readmission Risk Interventions     No data to display

## 2022-10-18 DIAGNOSIS — F1010 Alcohol abuse, uncomplicated: Secondary | ICD-10-CM | POA: Diagnosis not present

## 2022-10-18 DIAGNOSIS — J1569 Pneumonia due to other gram-negative bacteria: Secondary | ICD-10-CM | POA: Diagnosis not present

## 2022-10-18 DIAGNOSIS — J9601 Acute respiratory failure with hypoxia: Secondary | ICD-10-CM | POA: Diagnosis not present

## 2022-10-18 LAB — BASIC METABOLIC PANEL
Anion gap: 9 (ref 5–15)
BUN: <5 mg/dL — ABNORMAL LOW (ref 8–23)
CO2: 31 mmol/L (ref 22–32)
Calcium: 8.4 mg/dL — ABNORMAL LOW (ref 8.9–10.3)
Chloride: 90 mmol/L — ABNORMAL LOW (ref 98–111)
Creatinine, Ser: 0.52 mg/dL — ABNORMAL LOW (ref 0.61–1.24)
GFR, Estimated: >60 mL/min (ref >60)
Glucose, Bld: 100 mg/dL — ABNORMAL HIGH (ref 70–99)
Potassium: 3.4 mmol/L — ABNORMAL LOW (ref 3.5–5.1)
Sodium: 130 mmol/L — ABNORMAL LOW (ref 135–145)

## 2022-10-18 LAB — CBC
HCT: 27.6 % — ABNORMAL LOW (ref 39.0–52.0)
Hemoglobin: 9.4 g/dL — ABNORMAL LOW (ref 13.0–17.0)
MCH: 32 pg (ref 26.0–34.0)
MCHC: 34.1 g/dL (ref 30.0–36.0)
MCV: 93.9 fL (ref 80.0–100.0)
Platelets: 402 10*3/uL — ABNORMAL HIGH (ref 150–400)
RBC: 2.94 MIL/uL — ABNORMAL LOW (ref 4.22–5.81)
RDW: 17.2 % — ABNORMAL HIGH (ref 11.5–15.5)
WBC: 14.2 10*3/uL — ABNORMAL HIGH (ref 4.0–10.5)
nRBC: 0 % (ref 0.0–0.2)

## 2022-10-18 LAB — PHOSPHORUS: Phosphorus: 3.7 mg/dL (ref 2.5–4.6)

## 2022-10-18 LAB — GLUCOSE, CAPILLARY
Glucose-Capillary: 97 mg/dL (ref 70–99)
Glucose-Capillary: 108 mg/dL — ABNORMAL HIGH (ref 70–99)
Glucose-Capillary: 147 mg/dL — ABNORMAL HIGH (ref 70–99)

## 2022-10-18 LAB — MAGNESIUM: Magnesium: 1.9 mg/dL (ref 1.7–2.4)

## 2022-10-18 MED ORDER — POTASSIUM CHLORIDE 10 MEQ/100ML IV SOLN
10.0000 meq | INTRAVENOUS | Status: AC
  Administered 2022-10-18 (×2): 10 meq via INTRAVENOUS
  Filled 2022-10-18: qty 100

## 2022-10-18 NOTE — Consult Note (Signed)
WOC Nurse Consult Note: Reason for Consult:unstageable pressure injury to right heel in the setting of peripheral vascular disease.  S/P left BKA. Wound type: pressure, vascular.  Due to left BKA, patient mobilizes at home with a wheelchair and that heel is used for pivoting and turning.  We discuss this could be an impediment to healing.     Pressure Injury POA: Yes Measurement: 5 cm x 4.4 cm eschar with detached edges. Is dry. No drainage, odor or warmth.   Wound bed:100% devitalized tissue, edges reveal red border from 4 to 7 o'clock Drainage (amount, consistency, odor) none Periwound: dry skin, shiny hairless right extremity Dressing procedure/placement/frequency: Cleanse right heel with NS and pat dry. Paint with betadine daily.  Prevalon boot to be used while in bed.  Patient agrees to this.  Will not follow at this time.  Please re-consult if needed.  Estrellita Ludwig MSN, RN, FNP-BC CWON Wound, Ostomy, Continence Nurse [facility] [phone] Pager [phone]

## 2022-10-18 NOTE — Progress Notes (Signed)
Mobility Specialist Progress Note    10/18/22 1539  Mobility  Activity Refused mobility   Pt stated "I don't feel like it. I am comfortable where I am." Will f/u as schedule permits.   Hildred Alamin Mobility Specialist  Please Psychologist, sport and exercise or Rehab Office at [phone]

## 2022-10-18 NOTE — Care Management Important Message (Signed)
Important Message  Patient Details  Name: Ronald [name] MRN: [id number] Date of Birth: 1956/05/22   Medicare Important Message Given:  Yes     Hannah Beat 10/18/2022, 2:31 PM

## 2022-10-18 NOTE — Progress Notes (Signed)
Speech Language Pathology Treatment: Dysphagia  Patient Details Name: Ronald [name] MRN: [id number] DOB: [birth year] Today's Date: 10/18/2022 Time: 9924-2683 SLP Time Calculation (min) (ACUTE ONLY): 10 min  Assessment / Plan / Recommendation Clinical Impression  F/u after yesterday's clinical swallowing evaluation.  Clinical presentation today is similar to yesterday - he is impulsive, eats and drinks very quickly, leading to explosive coughing episodes.  There appears to be some benefit (less coughing) when drinking nectar thick liquids, but it is difficult to discern at bedside.  Pt is adamant that his swallowing is fine. He is quick to anger and his yelling escalates with inability to calm down. He is not receptive to efforts to pace his eating/drinking nor will he follow any basic aspiration precautions, generally because he denies there are problems. He is likely aspirating, which may or may not lead to adverse consequences. There is little else our service can offer given his resistance and behavioral issues.   Recommend continuing dysphagia 3/nectar thick liquids while admitted.  We will sign off.    HPI HPI: This is a 67 year old gentleman admitted 2/2 after being found down at home after a recent d/c from hospital where he refused SNF.  Pt was hyponatremic hypoxemic hypercarbic was placed on BiPAP in the emergency room.  Ultimately failed BiPAP requiring intubation mechanical ventilation 2/2--2/3.  Passed Yale swallow screen after extubation, then demonstrated consistent coughing with PO intake and was made NPO.  Pt consistently denies difficulty swallowing.  PMH: polysubstance abuse, cocaine, alcohol use. Pt had MBS 09/22/22 which was essentially normal.      SLP Plan  Discharge SLP treatment due to (comment)      Recommendations for follow up therapy are one component of a multi-disciplinary discharge planning process, led by the attending physician.  Recommendations may be updated  based on patient status, additional functional criteria and insurance authorization.    Recommendations  Diet recommendations: Dysphagia 3 (mechanical soft);Nectar-thick liquid Liquids provided via: Cup;Straw Medication Administration: Whole meds with puree Supervision: Patient able to self feed;Intermittent supervision to cue for compensatory strategies Compensations: Minimize environmental distractions;Slow rate;Small sips/bites Postural Changes and/or Swallow Maneuvers: Seated upright 90 degrees;Upright 30-60 min after meal                Oral Care Recommendations: Oral care BID Follow Up Recommendations: No SLP follow up SLP Visit Diagnosis: Dysphagia, unspecified (R13.10) Plan: Discharge SLP treatment due to (comment)         Ronald [name] Ringer, MA CCC/SLP Clinical Specialist - Acute Care SLP Acute Rehabilitation Services Office number [phone]   Ronald [name]  10/18/2022, 1:13 PM

## 2022-10-18 NOTE — Progress Notes (Addendum)
PROGRESS NOTE  [name] SNK:[id number] DOB: August 11, 1956 DOA: 10/15/2022 PCP: Demetrios Isaacs, FNP  HPI/Recap of past 57 hours: 67 year old male who is known polysubstance abuse, EtOH abuse cocaine abuse noncompliance, unstageable pressure injury to right heel, peripheral vascular disease, who presents from home via EMS with altered mental status along with noncompliance. He was recently admitted and discharged with concern that he needed to go to a skilled nursing facility for which he refused. He now presents with a sodium of 125, altered mental status hypoxic and hypercarbic and noted to be aspirating while on noninvasive mechanical ventilatory support requiring intubation.  Admitted to ICU.  EEG was negative for seizures.  Was started on IV antibiotics due to concern for aspiration.  Extubated on 10/16/2022.  Transferred to [facility] service on 10/18/2022.  11/07/2022: The patient was seen and examined at his bedside.  There were no acute events overnight.  Endorses pain in his right foot 8 out of 10.  Seen by wound care specialist for unstageable pressure injury to right heel in the setting of peripheral vascular disease.  Assessment/Plan: Principal Problem:   Respiratory failure (HCC) Active Problems:   Alcohol abuse  Acute hypercarbic/hypoxic respiratory failure status postextubation 10/16/2022.   History of COPD and chronic alcohol abuse  Currently on Unasyn due to concern for aspiration.  Will switch to Augmentin on 10/19/2022.   Continue DuoNebs  Continue incentive spirometer, pulmonary hygiene.    Acute on Chronic Hyponatremia in the setting of beer potomania  On admission serum sodium 112 Currently serum sodium 130. Encourage oral intake   Concern for possible aspiration Continue Unasyn Monitor WBC and fever curve   Dysphagia  - Speech Eval 1/11 >> Rec Regular diet, Thin Liquids, Seated upright 90 and 30-60 minutes after meal  - 05/2022 Esophagram with dysmotility  Continue  aspiration precautions   Resolved post repletion: Hypomagnesia    Prediabetes, blood sugars are well-controlled A1c 5.6 on 09/02/2022.   Chronic EtOH abuse with concern for withdrawal Cocaine Abuse  UDS positive for cocaine on 09/20/2022. Continue folic acid supplement, thiamine supplement, multivitamins, out of window for alcohol withdrawal.   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 No acute issues.  Unstageable right heel pressure injury, POA Wound care specialist consulted Continue local wound care    Time: 50 minutes    Best Practice (right click and "Reselect all SmartList Selections" daily)    Diet/type: NPO DVT prophylaxis: prophylactic heparin  GI prophylaxis: PPI Lines: N/A Foley:  Yes, and it is still needed Code Status:  full code Last date of multidisciplinary goals of care discussion [pending]   Labs   CBC: Last Labs          Recent Labs  Lab 10/15/22 0636 10/15/22 0642 10/15/22 0903 10/15/22 1148 10/16/22 0718 10/17/22 0602  WBC 15.8*  --   --   --  14.7* 13.8*  HGB 11.2* 12.6* 11.2* 11.9* 8.8* 9.0*  HCT 31.1* 37.0* 33.0* 35.0* 25.7* 26.7*  MCV 88.9  --   --   --  91.8 94.7  PLT 423*  --   --   --  418* 384        Code Status: Full code  Family Communication: None at bedside.  Disposition Plan: Possible discharge to home on 10/18/2022.  Patient has declined SNF placement.   Consultants: PCCM.  Procedures: Intubation Extubation  Antimicrobials: Unasyn  DVT prophylaxis: Subcu heparin 3 times daily  Status is: Inpatient The patient required at least 2  midnights for further evaluation and treatment of present condition.    Objective: Vitals:   10/17/22 2117 10/18/22 0010 10/18/22 0410 10/18/22 0755  BP: (!) 159/93 (!) 106/50 (!) 156/80 124/65  Pulse:  98  87  Resp:  16 20 18   Temp:  98.8 F (37.1 C)  98.5 F (36.9 C)  TempSrc:  Oral  Oral  SpO2: 92% 99%  100%  Weight:      Height:        Intake/Output  Summary (Last 24 hours) at 10/18/2022 1045 Last data filed at 10/18/2022 0410 Gross per 24 hour  Intake 100 ml  Output 200 ml  Net -100 ml   Filed Weights   10/15/22 1200 10/16/22 0356 10/17/22 0500  Weight: 59 kg 59 kg 60.9 kg    Exam:  General: 67 y.o. year-old male well developed well nourished in no acute distress.  Alert and oriented x3.  Hard of hearing. Cardiovascular: Regular rate and rhythm with no rubs or gallops.  No thyromegaly or JVD noted.   Respiratory: Clear to auscultation with no wheezes or rales.  Poor inspiratory effort. Abdomen: Soft nontender nondistended with normal bowel sounds x4 quadrants. Musculoskeletal: Left BKA.   Skin: Unstageable right heel pressure injury, POA. Psychiatry: Mood is appropriate for condition and setting   Data Reviewed: CBC: Recent Labs  Lab 10/15/22 0636 10/15/22 0642 10/15/22 0903 10/15/22 1148 10/16/22 0718 10/17/22 0602 10/18/22 0306  WBC 15.8*  --   --   --  14.7* 13.8* 14.2*  HGB 11.2*   < > 11.2* 11.9* 8.8* 9.0* 9.4*  HCT 31.1*   < > 33.0* 35.0* 25.7* 26.7* 27.6*  MCV 88.9  --   --   --  91.8 94.7 93.9  PLT 423*  --   --   --  418* 384 402*   < > = values in this interval not displayed.   Basic Metabolic Panel: Recent Labs  Lab 10/15/22 1627 10/15/22 2236 10/16/22 0718 10/16/22 1332 10/16/22 1919 10/16/22 2300 10/17/22 0602 10/18/22 0306  NA 122*   < > 125* 127* 128* 128* 131* 130*  K 3.9   < > 3.9 3.9 3.8 3.9 3.6 3.4*  CL 80*   < > 88* 87* 90* 90* 90* 90*  CO2 25   < > 30 31 30  32 32 31  GLUCOSE 111*   < > 132* 114* 132* 126* 94 100*  BUN 5*   < > 7* 7* 7* 8 6* <5*  CREATININE 0.52*   < > 0.58* 0.54* 0.69 0.63 0.45* 0.52*  CALCIUM 8.7*   < > 8.9 8.7* 8.2* 8.4* 8.3* 8.4*  MG 1.4*  --  1.5*  --   --   --  1.7 1.9  PHOS 3.5  --  2.4*  --   --   --  4.2 3.7   < > = values in this interval not displayed.   GFR: Estimated Creatinine Clearance: 79.3 mL/min (A) (by C-G formula based on SCr of 0.52 mg/dL  (L)). Liver Function Tests: Recent Labs  Lab 10/15/22 1135  AST 21  ALT 10  ALKPHOS 91  BILITOT 1.4*  PROT 7.1  ALBUMIN 3.8   No results for input(s): "LIPASE", "AMYLASE" in the last 168 hours. No results for input(s): "AMMONIA" in the last 168 hours. Coagulation Profile: No results for input(s): "INR", "PROTIME" in the last 168 hours. Cardiac Enzymes: No results for input(s): "CKTOTAL", "CKMB", "CKMBINDEX", "TROPONINI" in the last 168 hours.  BNP (last 3 results) No results for input(s): "PROBNP" in the last 8760 hours. HbA1C: No results for input(s): "HGBA1C" in the last 72 hours. CBG: Recent Labs  Lab 10/17/22 0759 10/17/22 1550 10/17/22 1719 10/17/22 2107 10/18/22 0754  GLUCAP 102* 140* 144* 132* 97   Lipid Profile: Recent Labs    10/16/22 0718  TRIG 58   Thyroid Function Tests: No results for input(s): "TSH", "T4TOTAL", "FREET4", "T3FREE", "THYROIDAB" in the last 72 hours. Anemia Panel: No results for input(s): "VITAMINB12", "FOLATE", "FERRITIN", "TIBC", "IRON", "RETICCTPCT" in the last 72 hours. Urine analysis:    Component Value Date/Time   COLORURINE STRAW (A) 09/20/2022 0229   APPEARANCEUR CLEAR 09/20/2022 0229   LABSPEC 1.003 (L) 09/20/2022 0229   PHURINE 7.0 09/20/2022 0229   GLUCOSEU NEGATIVE 09/20/2022 0229   HGBUR NEGATIVE 09/20/2022 0229   BILIRUBINUR NEGATIVE 09/20/2022 0229   KETONESUR NEGATIVE 09/20/2022 0229   PROTEINUR NEGATIVE 09/20/2022 0229   NITRITE NEGATIVE 09/20/2022 0229   LEUKOCYTESUR NEGATIVE 09/20/2022 0229   Sepsis Labs: @LABRCNTIP (procalcitonin:4,lacticidven:4)  ) Recent Results (from the past 240 hour(s))  Resp panel by RT-PCR (RSV, Flu A&B, Covid) Anterior Nasal Swab     Status: None   Collection Time: 10/15/22  6:36 AM   Specimen: Anterior Nasal Swab  Result Value Ref Range Status   SARS Coronavirus 2 by RT PCR NEGATIVE NEGATIVE Final   Influenza A by PCR NEGATIVE NEGATIVE Final   Influenza B by PCR NEGATIVE  NEGATIVE Final    Comment: (NOTE) The Xpert Xpress SARS-CoV-2/FLU/RSV plus assay is intended as an aid in the diagnosis of influenza from Nasopharyngeal swab specimens and should not be used as a sole basis for treatment. Nasal washings and aspirates are unacceptable for Xpert Xpress SARS-CoV-2/FLU/RSV testing.  Fact Sheet for Patients: 12/14/22  Fact Sheet for Healthcare Providers: BloggerCourse.com  This test is not yet approved or cleared by the SeriousBroker.it FDA and has been authorized for detection and/or diagnosis of SARS-CoV-2 by FDA under an Emergency Use Authorization (EUA). This EUA will remain in effect (meaning this test can be used) for the duration of the COVID-19 declaration under Section 564(b)(1) of the Act, 21 U.S.C. section 360bbb-3(b)(1), unless the authorization is terminated or revoked.     Resp Syncytial Virus by PCR NEGATIVE NEGATIVE Final    Comment: (NOTE) Fact Sheet for Patients: [place]  Fact Sheet for Healthcare Providers: BloggerCourse.com  This test is not yet approved or cleared by the SeriousBroker.it FDA and has been authorized for detection and/or diagnosis of SARS-CoV-2 by FDA under an Emergency Use Authorization (EUA). This EUA will remain in effect (meaning this test can be used) for the duration of the COVID-19 declaration under Section 564(b)(1) of the Act, 21 U.S.C. section 360bbb-3(b)(1), unless the authorization is terminated or revoked.  Performed at [facility], [address] [place], [place]   MRSA Next Gen by PCR, Nasal     Status: Abnormal   Collection Time: 10/15/22 11:09 AM  Result Value Ref Range Status   MRSA by PCR Next Gen DETECTED (A) NOT DETECTED Final    Comment: RESULT CALLED TO, READ BACK BY AND VERIFIED WITH: RN R04/02/24 [phone] @1603  FH (NOTE) The GeneXpert MRSA Assay (FDA  approved for NASAL specimens only), is one component of a comprehensive MRSA colonization surveillance program. It is not intended to diagnose MRSA infection nor to guide or monitor treatment for MRSA infections. Test performance is not FDA approved in patients less  than 72 years old. Performed at [facility], [place] [address] [place], [place] [address]   Culture, blood (Routine X 2) w Reflex to ID Panel     Status: None (Preliminary result)   Collection Time: 10/15/22 11:35 AM   Specimen: BLOOD LEFT HAND  Result Value Ref Range Status   Specimen Description BLOOD LEFT HAND  Final   Special Requests   Final    BOTTLES DRAWN AEROBIC ONLY Blood Culture adequate volume   Culture   Final    NO GROWTH 3 DAYS Performed at [facility], [place] [address] [place], [place] [address]    Report Status PENDING  Incomplete  Culture, blood (Routine X 2) w Reflex to ID Panel     Status: None (Preliminary result)   Collection Time: 10/15/22 11:35 AM   Specimen: BLOOD LEFT ARM  Result Value Ref Range Status   Specimen Description BLOOD LEFT ARM  Final   Special Requests IN PEDIATRIC BOTTLE Blood Culture adequate volume  Final   Culture   Final    NO GROWTH 3 DAYS Performed at [facility], [place] [address] [place], [place] [address]    Report Status PENDING  Incomplete  Culture, Respiratory w Gram Stain (tracheal aspirate)     Status: None (Preliminary result)   Collection Time: 10/16/22  9:59 AM   Specimen: Tracheal Aspirate; Respiratory  Result Value Ref Range Status   Specimen Description TRACHEAL ASPIRATE  Final   Special Requests NONE  Final   Gram Stain   Final    FEW WBC PRESENT, PREDOMINANTLY PMN NO ORGANISMS SEEN    Culture   Final    CULTURE REINCUBATED FOR BETTER GROWTH Performed at [facility], [address], [place], [place] [address]    Report Status PENDING  Incomplete      Studies: No results found.  Scheduled Meds:  Chlorhexidine Gluconate  Cloth  6 each Topical Q0600   Chlorhexidine Gluconate Cloth  6 each Topical Q0600   docusate  100 mg Per Tube BID   fluticasone furoate-vilanterol  1 puff Inhalation Daily   folic acid  1 mg Per Tube Daily   heparin  5,000 Units Subcutaneous Q8H   insulin aspart  0-6 Units Subcutaneous TID AC & HS   multivitamin with minerals  1 tablet Per Tube Daily   mupirocin ointment  1 Application Nasal BID   pantoprazole  40 mg Oral Daily   polyethylene glycol  17 g Per Tube Daily   thiamine  100 mg Per Tube Daily    Continuous Infusions:  ampicillin-sulbactam (UNASYN) IV 3 g (10/18/22 0625)     LOS: 3 days     Kayleen Memos, MD [facility] Pager [phone]  If 7PM-7AM, please contact night-coverage [email or web address] Password [email or web address] 10/18/2022, 10:45 AM

## 2022-10-19 DIAGNOSIS — Z515 Encounter for palliative care: Secondary | ICD-10-CM | POA: Diagnosis not present

## 2022-10-19 DIAGNOSIS — Z7189 Other specified counseling: Secondary | ICD-10-CM

## 2022-10-19 DIAGNOSIS — J9601 Acute respiratory failure with hypoxia: Secondary | ICD-10-CM | POA: Diagnosis not present

## 2022-10-19 DIAGNOSIS — F1010 Alcohol abuse, uncomplicated: Secondary | ICD-10-CM | POA: Diagnosis not present

## 2022-10-19 DIAGNOSIS — J1569 Pneumonia due to other gram-negative bacteria: Secondary | ICD-10-CM | POA: Diagnosis not present

## 2022-10-19 LAB — CULTURE, RESPIRATORY W GRAM STAIN

## 2022-10-19 LAB — CBC
HCT: 31.2 % — ABNORMAL LOW (ref 39.0–52.0)
Hemoglobin: 10.8 g/dL — ABNORMAL LOW (ref 13.0–17.0)
MCH: 32.4 pg (ref 26.0–34.0)
MCHC: 34.6 g/dL (ref 30.0–36.0)
MCV: 93.7 fL (ref 80.0–100.0)
Platelets: 398 10*3/uL (ref 150–400)
RBC: 3.33 MIL/uL — ABNORMAL LOW (ref 4.22–5.81)
RDW: 17.4 % — ABNORMAL HIGH (ref 11.5–15.5)
WBC: 15.6 10*3/uL — ABNORMAL HIGH (ref 4.0–10.5)
nRBC: 0 % (ref 0.0–0.2)

## 2022-10-19 LAB — BASIC METABOLIC PANEL
Anion gap: 7 (ref 5–15)
BUN: <5 mg/dL — ABNORMAL LOW (ref 8–23)
CO2: 32 mmol/L (ref 22–32)
Calcium: 9 mg/dL (ref 8.9–10.3)
Chloride: 91 mmol/L — ABNORMAL LOW (ref 98–111)
Creatinine, Ser: 0.5 mg/dL — ABNORMAL LOW (ref 0.61–1.24)
GFR, Estimated: >60 mL/min (ref >60)
Glucose, Bld: 98 mg/dL (ref 70–99)
Potassium: 3.6 mmol/L (ref 3.5–5.1)
Sodium: 130 mmol/L — ABNORMAL LOW (ref 135–145)

## 2022-10-19 LAB — GLUCOSE, CAPILLARY
Glucose-Capillary: 146 mg/dL — ABNORMAL HIGH (ref 70–99)
Glucose-Capillary: 133 mg/dL — ABNORMAL HIGH (ref 70–99)

## 2022-10-19 LAB — C-REACTIVE PROTEIN: CRP: 0.7 mg/dL (ref <1.0)

## 2022-10-19 LAB — PHOSPHORUS: Phosphorus: 3.4 mg/dL (ref 2.5–4.6)

## 2022-10-19 LAB — MAGNESIUM: Magnesium: 1.7 mg/dL (ref 1.7–2.4)

## 2022-10-19 LAB — SEDIMENTATION RATE: Sed Rate: 22 mm/hr — ABNORMAL HIGH (ref 0–16)

## 2022-10-19 NOTE — Consult Note (Signed)
Consultation Note Date: 10/19/2022   Patient Name: Ronald [name]  DOB: October 28, 1955  MRN: [id number]  Age / Sex: 67 y.o., male  PCP: Demetrios Isaacs, FNP Referring Physician: Kayleen Memos, DO  Reason for Consultation: Establishing goals of care  HPI/Patient Profile: 67 y.o. 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   Clinical Assessment and Goals of Care: Consult received and extensive chart review of current and past hospital stays and diagnostics/labs. Noted SLP consultation. I met with Ronald [name] and he is lying in bed and eating his breakfast. He is pleasant in conversation to me today. I discussed with him him health and concerns. He tells me that he is feeling much better and hopeful to go home soon. We reviewed his trouble swallowing - he denies any issues with swallowing - I encouraged that he sits up well to eat and takes his time to make this as safe as possible. He was receptive.   I reviewed with Ronald [name] that he has been on ventilator support multiple times previously. I asked how he felt about this acknowledging that it must be scary to become that ill. He shares that it is scary. I asked him if he would want to go back on ventilator if needed and he confirms he wants all measures to keep him alive. I discussed with him his wishes to be kept alive on machines long term and when we discussed what this looks like he tells me he would not want long term support. He would not want interventions that would require him to live in a facility. His goal is to remain in his home.   I asked Ronald [name] about his surrogate decision maker.  He names his sister, Ronald [name], as the person he would trust to make medical decisions for him if he is unable to do so himself.   All questions/concerns addressed. Emotional support provided.   Primary Decision Maker PATIENT    SUMMARY OF RECOMMENDATIONS   - Full code/full scope - Sister Ronald [name] named as surrogate - He would not want to be prolonged on life support and would not want to live in facility  Code Status/Advance Care Planning: Full code   Symptom Management:  Per attending.   Prognosis:  Unable to determine  Discharge Planning: Home with Home Health      Primary Diagnoses: Present on Admission:  Respiratory failure ([place])  Alcohol abuse   I have reviewed the medical record, interviewed the patient and family, and examined the patient. The following aspects are pertinent.  Past Medical History:  Diagnosis Date   Critical lower limb ischemia (HCC)    ETOH abuse    Gout    Hypertension    Social History   Socioeconomic History   Marital status:  Single    Spouse name: Not on file   Number of children: Not on file   Years of education: Not on file   Highest education level: Not on file  Occupational History   Not on file  Tobacco Use   Smoking status: Every Day    Packs/day: 0.50    Years: 40.00    Total pack years: 20.00    Types: Cigarettes   Smokeless tobacco: Never  Vaping Use   Vaping Use: Every day  Substance and Sexual Activity   Alcohol use: Yes    Alcohol/week: 120.0 standard drinks of alcohol    Types: 120 Cans of beer per week    Comment: 2 -3 40 oz beers per day   Drug use: Yes    Types: Cocaine, Marijuana    Comment: crack   Sexual activity: Not on file  Other Topics Concern   Not on file  Social History Narrative   Not on file   Social Determinants of Health   Financial Resource Strain: Not on file  Food Insecurity: No Food Insecurity (10/17/2022)   Hunger Vital Sign    Worried About Running Out of Food in the Last Year:  Never true    Ran Out of Food in the Last Year: Never true  Recent Concern: Food Insecurity - Food Insecurity Present (09/17/2022)   Hunger Vital Sign    Worried About Running Out of Food in the Last Year: Sometimes true    Ran Out of Food in the Last Year: Sometimes true  Transportation Needs: Unmet Transportation Needs (10/17/2022)   PRAPARE - Hydrologist (Medical): Yes    Lack of Transportation (Non-Medical): Yes  Physical Activity: Not on file  Stress: Not on file  Social Connections: Not on file   Family History  Problem Relation Age of Onset   Diabetes Mellitus II Mother    CAD Neg Hx    Stroke Neg Hx    Scheduled Meds:  Chlorhexidine Gluconate Cloth  6 each Topical Q0600   Chlorhexidine Gluconate Cloth  6 each Topical Q0600   docusate  100 mg Per Tube BID   fluticasone furoate-vilanterol  1 puff Inhalation Daily   folic acid  1 mg Per Tube Daily   heparin  5,000 Units Subcutaneous Q8H   insulin aspart  0-6 Units Subcutaneous TID AC & HS   multivitamin with minerals  1 tablet Per Tube Daily   mupirocin ointment  1 Application Nasal BID   pantoprazole  40 mg Oral Daily   polyethylene glycol  17 g Per Tube Daily   thiamine  100 mg Per Tube Daily   Continuous Infusions:  ampicillin-sulbactam (UNASYN) IV 3 g (10/19/22 0621)   PRN Meds:.albuterol, docusate, mouth rinse, polyethylene glycol No Known Allergies Review of Systems  All other systems reviewed and are negative.   Physical Exam Vitals and nursing note reviewed.  Constitutional:      General: He is not in acute distress.    Appearance: He is ill-appearing.  Cardiovascular:     Rate and Rhythm: Tachycardia present.  Pulmonary:     Effort: No tachypnea, accessory muscle usage or respiratory distress.  Abdominal:     General: Abdomen is flat.  Neurological:     Mental Status: He is alert and oriented to person, place, and time.  Psychiatric:     Comments: Poor insight into health  complications     Vital Signs: BP 123/68 (BP Location: Right  Arm)   Pulse (!) 102   Temp 98.4 F (36.9 C) (Oral)   Resp 18   Ht 5\' 7"  (1.702 m)   Wt 61 kg   SpO2 94%   BMI 21.06 kg/m  Pain Scale: 0-10   Pain Score: 0-No pain   SpO2: SpO2: 94 % O2 Device:SpO2: 94 % O2 Flow Rate: .O2 Flow Rate (L/min): 3 L/min  IO: Intake/output summary:  Intake/Output Summary (Last 24 hours) at 10/19/2022 0847 Last data filed at 10/19/2022 0200 Gross per 24 hour  Intake 240 ml  Output 2775 ml  Net -2535 ml    LBM: Last BM Date : 10/18/22 Baseline Weight: Weight: 59 kg Most recent weight: Weight: 61 kg     Palliative Assessment/Data:     Time In: 1000  Time Total: 45 min  Greater than 50%  of this time was spent counseling and coordinating care related to the above assessment and plan.  Signed by: Vinie Sill, NP Palliative Medicine Team Pager # [phone] (M-F 8a-5p) Team Phone # [phone] (Nights/Weekends)

## 2022-10-19 NOTE — Progress Notes (Signed)
PROGRESS NOTE  [name] ERD:[id number] DOB: 08/08/1956 DOA: 10/15/2022 PCP: Demetrios Isaacs, FNP  HPI/Recap of past 63 hours: 67 year old male who is known polysubstance abuse, EtOH abuse cocaine abuse, noncompliance, unstageable pressure injury to right heel, peripheral vascular disease, who presents from home via EMS with altered mental status along with noncompliance. Recently admitted and discharged with concern that he needed to go to a skilled nursing facility for which he refused. He now presents with a sodium of 125, altered mental status, hypoxic and hypercarbic, and noted to be aspirating while on noninvasive mechanical ventilatory support requiring intubation.  Admitted to ICU.  EEG was negative for seizures.  Was started on IV antibiotics due to concern for aspiration.  Extubated on 10/16/2022.  Transferred to [facility] service on 10/18/2022.  11/08/2022: The patient was seen and examined at his bedside.  He has no new complaints this morning.  WBC uprising, on Unasyn.  Assessment/Plan: Principal Problem:   Respiratory failure (HCC) Active Problems:   Alcohol abuse  Acute hypercarbic/hypoxic respiratory failure status postextubation 10/16/2022.   History of COPD and chronic alcohol abuse  Currently on Unasyn due to concern for aspiration.  Will switch to Augmentin on 10/20/2022 to complete 7 days of antibiotics.   Continue DuoNebs  Continue incentive spirometer, pulmonary hygiene.   Proteus mirabilis pneumonia, POA Proteus mirabilis grew from tracheal aspirate Sensitive to Unasyn, continue   Acute on Chronic Hyponatremia in the setting of beer potomania  On admission serum sodium 112 Currently serum sodium 130, stable. Encourage oral intake   Dysphagia  Seen by speech therapist on dysphagia 3 diet, nectar thick liquid, home meds with pure, per speech therapist Continue aspiration precautions   Resolved post repletion: Hypomagnesia.  Resolved post repletion: Hypokalemia.    Prediabetes, blood sugars are well-controlled A1c 5.6 on 09/02/2022.   Chronic EtOH abuse with concern for withdrawal Cocaine Abuse  UDS positive for cocaine on 09/20/2022. Continue folic acid supplement, thiamine supplement, multivitamins, out of window for alcohol withdrawal.   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   Time 50 minutes.    Best Practice (right click and "Reselect all SmartList Selections" daily)    Diet/type: NPO DVT prophylaxis: prophylactic heparin  GI prophylaxis: PPI Lines: N/A Foley:  Yes, and it is still needed Code Status:  full code Last date of multidisciplinary goals of care discussion [pending]   Labs   CBC: Last Labs          Recent Labs  Lab 10/15/22 0636 10/15/22 0642 10/15/22 0903 10/15/22 1148 10/16/22 0718 10/17/22 0602  WBC 15.8*  --   --   --  14.7* 13.8*  HGB 11.2* 12.6* 11.2* 11.9* 8.8* 9.0*  HCT 31.1* 37.0* 33.0* 35.0* 25.7* 26.7*  MCV 88.9  --   --   --  91.8 94.7  PLT 423*  --   --   --  418* 384        Code Status: Full code  Family Communication: None at bedside.  Disposition Plan: Possible discharge to home on 10/18/2022.  Patient has declined SNF placement.   Consultants: PCCM.  Procedures: Intubation Extubation  Antimicrobials: Unasyn  DVT prophylaxis: Subcu heparin 3 times daily  Status is: Inpatient The patient required at least 2 midnights for further evaluation and treatment of present condition.    Objective: Vitals:   10/19/22  0200 10/19/22 0457 10/19/22 0500 10/19/22 0740  BP:  123/68    Pulse: 86 (!) 102    Resp:  18    Temp:  98.2 F (36.8 C)  98.4 F (36.9 C)  TempSrc:  Oral  Oral  SpO2:  94%    Weight:   61 kg   Height:        Intake/Output Summary (Last 24 hours) at 10/19/2022 1210 Last data filed at  10/19/2022 1111 Gross per 24 hour  Intake 480 ml  Output 3426 ml  Net -2946 ml   Filed Weights   10/16/22 0356 10/17/22 0500 10/19/22 0500  Weight: 59 kg 60.9 kg 61 kg    Exam:  General: 67 y.o. year-old male frail-appearing in no acute stress.  He is alert and oriented x 3.  Hard of hearing.   Cardiovascular: Regular rate and rhythm no rubs or gallops.  Respiratory: Mild rales at bases no wheezing noted.  Poor inspiratory effort. Abdomen: Soft nontender nondistended with normal bowel sounds x4 quadrants. Musculoskeletal: Left BKA.   Skin: Unstageable right heel pressure injury, POA. Psychiatry: Mood is appropriate for condition.   Data Reviewed: CBC: Recent Labs  Lab 10/15/22 0636 10/15/22 5852 10/15/22 1148 10/16/22 0718 10/17/22 0602 10/18/22 0306 10/19/22 0357  WBC 15.8*  --   --  14.7* 13.8* 14.2* 15.6*  HGB 11.2*   < > 11.9* 8.8* 9.0* 9.4* 10.8*  HCT 31.1*   < > 35.0* 25.7* 26.7* 27.6* 31.2*  MCV 88.9  --   --  91.8 94.7 93.9 93.7  PLT 423*  --   --  418* 384 402* 398   < > = values in this interval not displayed.   Basic Metabolic Panel: Recent Labs  Lab 10/15/22 1627 10/15/22 2236 10/16/22 0718 10/16/22 1332 10/16/22 1919 10/16/22 2300 10/17/22 0602 10/18/22 0306 10/19/22 0357  NA 122*   < > 125*   < > 128* 128* 131* 130* 130*  K 3.9   < > 3.9   < > 3.8 3.9 3.6 3.4* 3.6  CL 80*   < > 88*   < > 90* 90* 90* 90* 91*  CO2 25   < > 30   < > 30 32 32 31 32  GLUCOSE 111*   < > 132*   < > 132* 126* 94 100* 98  BUN 5*   < > 7*   < > 7* 8 6* <5* <5*  CREATININE 0.52*   < > 0.58*   < > 0.69 0.63 0.45* 0.52* 0.50*  CALCIUM 8.7*   < > 8.9   < > 8.2* 8.4* 8.3* 8.4* 9.0  MG 1.4*  --  1.5*  --   --   --  1.7 1.9 1.7  PHOS 3.5  --  2.4*  --   --   --  4.2 3.7 3.4   < > = values in this interval not displayed.   GFR: Estimated Creatinine Clearance: 79.4 mL/min (A) (by C-G formula based on SCr of 0.5 mg/dL (L)). Liver Function Tests: Recent Labs  Lab  10/15/22 1135  AST 21  ALT 10  ALKPHOS 91  BILITOT 1.4*  PROT 7.1  ALBUMIN 3.8   No results for input(s): "LIPASE", "AMYLASE" in the last 168 hours. No results for input(s): "AMMONIA" in the last 168 hours. Coagulation Profile: No results for input(s): "INR", "PROTIME" in the last 168 hours. Cardiac Enzymes: No results for input(s): "CKTOTAL", "CKMB", "CKMBINDEX", "TROPONINI" in the  last 168 hours. BNP (last 3 results) No results for input(s): "PROBNP" in the last 8760 hours. HbA1C: No results for input(s): "HGBA1C" in the last 72 hours. CBG: Recent Labs  Lab 10/17/22 1719 10/17/22 2107 10/18/22 0754 10/18/22 1143 10/18/22 2229  GLUCAP 144* 132* 97 108* 147*   Lipid Profile: No results for input(s): "CHOL", "HDL", "LDLCALC", "TRIG", "CHOLHDL", "LDLDIRECT" in the last 72 hours.  Thyroid Function Tests: No results for input(s): "TSH", "T4TOTAL", "FREET4", "T3FREE", "THYROIDAB" in the last 72 hours. Anemia Panel: No results for input(s): "VITAMINB12", "FOLATE", "FERRITIN", "TIBC", "IRON", "RETICCTPCT" in the last 72 hours. Urine analysis:    Component Value Date/Time   COLORURINE STRAW (A) 09/20/2022 0229   APPEARANCEUR CLEAR 09/20/2022 0229   LABSPEC 1.003 (L) 09/20/2022 0229   PHURINE 7.0 09/20/2022 0229   GLUCOSEU NEGATIVE 09/20/2022 0229   HGBUR NEGATIVE 09/20/2022 0229   BILIRUBINUR NEGATIVE 09/20/2022 0229   KETONESUR NEGATIVE 09/20/2022 0229   PROTEINUR NEGATIVE 09/20/2022 0229   NITRITE NEGATIVE 09/20/2022 0229   LEUKOCYTESUR NEGATIVE 09/20/2022 0229   Sepsis Labs: @LABRCNTIP (procalcitonin:4,lacticidven:4)  ) Recent Results (from the past 240 hour(s))  Resp panel by RT-PCR (RSV, Flu A&B, Covid) Anterior Nasal Swab     Status: None   Collection Time: 10/15/22  6:36 AM   Specimen: Anterior Nasal Swab  Result Value Ref Range Status   SARS Coronavirus 2 by RT PCR NEGATIVE NEGATIVE Final   Influenza A by PCR NEGATIVE NEGATIVE Final   Influenza B by PCR  NEGATIVE NEGATIVE Final    Comment: (NOTE) The Xpert Xpress SARS-CoV-2/FLU/RSV plus assay is intended as an aid in the diagnosis of influenza from Nasopharyngeal swab specimens and should not be used as a sole basis for treatment. Nasal washings and aspirates are unacceptable for Xpert Xpress SARS-CoV-2/FLU/RSV testing.  Fact Sheet for Patients: 12/14/22  Fact Sheet for Healthcare Providers: BloggerCourse.com  This test is not yet approved or cleared by the [employer]iousBro[employer].it FDA and has been authorized for detection and/or diagnosis of SARS-CoV-2 by FDA under an Emergency Use Authorization (EUA). This EUA will remain in effect (meaning this test can be used) for the duration of the COVID-19 declaration under Section 564(b)(1) of the Act, 21 U.S.C. section 360bbb-3(b)(1), unless the authorization is terminated or revoked.     Resp Syncytial Virus by PCR NEGATIVE NEGATIVE Final    Comment: (NOTE) Fact Sheet for Patients: [place]  Fact Sheet for Healthcare Providers: BloggerCourse.com  This test is not yet approved or cleared by the [employer]iousBroker.it FDA and has been authorized for detection and/or diagnosis of SARS-CoV-2 by FDA under an Emergency Use Authorization (EUA). This EUA will remain in effect (meaning this test can be used) for the duration of the COVID-19 declaration under Section 564(b)(1) of the Act, 21 U.S.C. section 360bbb-3(b)(1), unless the authorization is terminated or revoked.  Performed at [facility], [address], [place], [place]   MRSA Next Gen by PCR, Nasal     Status: Abnormal   Collection Time: 10/15/22 11:09 AM  Result Value Ref Range Status   MRSA by PCR Next Gen DETECTED (A) NOT DETECTED Final    Comment: RESULT CALLED TO, READ BACK BY AND VERIFIED WITH: RN R04/02/24 [phone] @1603  FH (NOTE) The GeneXpert MRSA Assay  (FDA approved for NASAL specimens only), is one component of a comprehensive MRSA colonization surveillance program. It is not intended to diagnose MRSA infection nor to guide or monitor treatment for MRSA infections. Test performance  is not FDA approved in patients less than 66 years old. Performed at [facility], [address], [place], [place] [address]   Culture, blood (Routine X 2) w Reflex to ID Panel     Status: None (Preliminary result)   Collection Time: 10/15/22 11:35 AM   Specimen: BLOOD LEFT HAND  Result Value Ref Range Status   Specimen Description BLOOD LEFT HAND  Final   Special Requests   Final    BOTTLES DRAWN AEROBIC ONLY Blood Culture adequate volume   Culture   Final    NO GROWTH 4 DAYS Performed at [facility], [address] [place], [place] [address]    Report Status PENDING  Incomplete  Culture, blood (Routine X 2) w Reflex to ID Panel     Status: None (Preliminary result)   Collection Time: 10/15/22 11:35 AM   Specimen: BLOOD LEFT ARM  Result Value Ref Range Status   Specimen Description BLOOD LEFT ARM  Final   Special Requests IN PEDIATRIC BOTTLE Blood Culture adequate volume  Final   Culture   Final    NO GROWTH 4 DAYS Performed at [facility], [address], [place], [place] [address]    Report Status PENDING  Incomplete  Culture, Respiratory w Gram Stain (tracheal aspirate)     Status: None   Collection Time: 10/16/22  9:59 AM   Specimen: Tracheal Aspirate; Respiratory  Result Value Ref Range Status   Specimen Description TRACHEAL ASPIRATE  Final   Special Requests NONE  Final   Gram Stain   Final    FEW WBC PRESENT, PREDOMINANTLY PMN NO ORGANISMS SEEN Performed at [facility], [address] [place], [place] [address]    Culture RARE PROTEUS MIRABILIS  Final   Report Status 10/19/2022 FINAL  Final   Organism ID, Bacteria PROTEUS MIRABILIS  Final      Susceptibility   Proteus mirabilis - MIC*    AMPICILLIN <=2  SENSITIVE Sensitive     CEFAZOLIN <=4 SENSITIVE Sensitive     CEFEPIME <=0.12 SENSITIVE Sensitive     CEFTAZIDIME <=1 SENSITIVE Sensitive     CEFTRIAXONE <=0.25 SENSITIVE Sensitive     CIPROFLOXACIN <=0.25 SENSITIVE Sensitive     GENTAMICIN <=1 SENSITIVE Sensitive     IMIPENEM 8 INTERMEDIATE Intermediate     TRIMETH/SULFA <=20 SENSITIVE Sensitive     AMPICILLIN/SULBACTAM <=2 SENSITIVE Sensitive     PIP/TAZO <=4 SENSITIVE Sensitive     * RARE PROTEUS MIRABILIS      Studies: No results found.  Scheduled Meds:  Chlorhexidine Gluconate Cloth  6 each Topical Q0600   Chlorhexidine Gluconate Cloth  6 each Topical Q0600   docusate  100 mg Per Tube BID   fluticasone furoate-vilanterol  1 puff Inhalation Daily   folic acid  1 mg Per Tube Daily   heparin  5,000 Units Subcutaneous Q8H   insulin aspart  0-6 Units Subcutaneous TID AC & HS   multivitamin with minerals  1 tablet Per Tube Daily   mupirocin ointment  1 Application Nasal BID   pantoprazole  40 mg Oral Daily   polyethylene glycol  17 g Per Tube Daily   thiamine  100 mg Per Tube Daily    Continuous Infusions:  ampicillin-sulbactam (UNASYN) IV 3 g (10/19/22 0621)     LOS: 4 days     Darlin Drop, MD [facility] Pager [phone]  If 7PM-7AM, please contact night-coverage [email or web address] Password [facility] 10/19/2022,  12:10 PM

## 2022-10-19 NOTE — Progress Notes (Signed)
CSW met with pt regarding SDOH: transportation. Pt reports that his sister can sometimes assist with transportation, but not all the time.  Pt does have medicaid, has not attempted to set up medicaid transportation. CSW provided contact information for medicaid transportation of [facility] and also [facility].  Pt verbalized understanding and will follow up. Lurline Idol, MSW, LCSW 2/6/20242:34 PM

## 2022-10-19 NOTE — Progress Notes (Signed)
Physical Therapy Treatment Patient Details Name: Ronald [name] MRN: [id number] DOB: 05-09-1956 Today's Date: 10/19/2022   History of Present Illness This is a 67 year old gentleman admitted 2/2 after being found down at home after a recent d/c from hospital where he refused SNF. Pt was hyponatremic hypoxemic hypercarbic was placed on BiPAP in the emergency room.  Ultimately failed BiPAP requiring intubation mechanical ventilation 2/2--2/3. PMH: polysubstance abuse, cocaine, alcohol use.    PT Comments    Pt received in supine, A&O and agreeable to therapy session with max encouragement. Pt frequently becomes agitated but with increased time, he is able to follow commands for lateral scoot transfer training and seated RLE exercises. Pt noted to have large R heel wound and is refusing to wear prevalon boots, discussed with pt/RN floating his R heel in bed if he will not wear Prevalon boot to prevent further pressure. Pt needing up to minA for lateral scoot along EOB to simulate transfer to drop arm chair. Doctor and RN notified he still does not have cast shoe or heel offloading shoe and would benefit from order placed for this (order would have to be placed to Outside Vendor since ortho techs do not have heel offloading [employer] shoes in hospital stock) to protect his R heel and for further clarification of his WB status. Pt continues to benefit from PT services to progress toward functional mobility goals.    Recommendations for follow up therapy are one component of a multi-disciplinary discharge planning process, led by the attending physician.  Recommendations may be updated based on patient status, additional functional criteria and insurance authorization.  Follow Up Recommendations  Skilled nursing-short term rehab (<3 hours/day) (pt likely to refuse, would benefit from max [facility] services if he instead goes home) Can patient physically be transported by private vehicle: No   Assistance Recommended  at Discharge Frequent or constant Supervision/Assistance  Patient can return home with the following Assistance with cooking/housework;Direct supervision/assist for financial management;Direct supervision/assist for medications management;Assist for transportation;A little help with walking and/or transfers;A little help with bathing/dressing/bathroom   Equipment Recommendations  Wheelchair (measurements PT);Wheelchair cushion (measurements PT);Other (comment) (some type of heel offloading shoe; consider slide board for transfers)    Recommendations for Other Services OT consult (education on transfers given R heel wound and pt may need further cognitive work-up to see if he can manage his own meds safely)     Precautions / Restrictions Precautions Precautions: Fall Precaution Comments: 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 No WB orders seen in system.     Mobility  Bed Mobility Overal bed mobility: Needs Assistance Bed Mobility: Supine to Sit     Supine to sit: Min guard, HOB elevated Sit to supine: Supervision   General bed mobility comments: Min cues for safety and pt needed assist with blanket removal. Mild instability but pt corrects with UE support and bed rail.    Transfers Overall transfer level: Needs assistance Equipment used: None Transfers: Bed to chair/wheelchair/BSC            Lateral/Scoot Transfers: Min assist General transfer comment: cues for avoiding RLE heel WB given large wound, and use of BUE to lift hips up off  bed and over, pt able to scoot ~55ft toward FOB and ~38ft toward [facility], then defers more scooting. MinA for safety cues and bed pad assist for  scooting.    Ambulation/Gait               General Gait Details: unable- large heel wound and no heel offloading shoes or orthosis in the room   Stairs             Wheelchair Mobility    Modified Rankin (Stroke Patients Only)       Balance Overall balance assessment: Needs assistance Sitting-balance support: Single extremity supported, Feet unsupported Sitting balance-Leahy Scale: Fair Sitting balance - Comments: needs BUE support for dynamic tasks and single UE support for static sitting       Standing balance comment: Did not test -pt does not have proper footwear in room currently given large R heel wound                            Cognition Arousal/Alertness: Awake/alert Behavior During Therapy: Impulsive, Agitated Overall Cognitive Status: No family/caregiver present to determine baseline cognitive functioning Area of Impairment: Following commands, Safety/judgement, Problem solving                     Memory: Decreased short-term memory Following Commands: Follows multi-step commands inconsistently, Follows one step commands with increased time Safety/Judgement: Decreased awareness of safety Awareness: Intellectual Problem Solving: Difficulty sequencing, Requires verbal cues General Comments: Pt becomes agitated with PTA encouragement for him to perform tasks he is not interested in. Pt reports prevalon boot is too painful to wear in the bed and more agitated when PTA attempts to explain risks of not wearing it. Pt agreeable to his heel being floated but then does not keep it elevated on blanket. RN notified in case a bigger pillow can be found for his bed may help to improve his compliance with this. Pt A&O to month, year, location, reason for admission.        Exercises General Exercises - Lower Extremity Long Arc Quad: AROM, Right, 10 reps, Seated Straight Leg Raises: AROM, Right, 5 reps, Supine Hip Flexion/Marching: AROM, Right,  10 reps, Seated    General Comments        Pertinent Vitals/Pain Pain Assessment Pain Assessment: Faces Faces Pain Scale: Hurts little more Pain Location: R heel, generalized Pain Descriptors / Indicators: Grimacing, Guarding, Moaning Pain Intervention(s): Limited activity within patient's tolerance, Monitored during session, Repositioned, Other (comment) (R heel floated once back to supine; RN notified of pt's intermittent agitation)    Home Living                          Prior Function            PT Goals (current goals can now be found in the care plan section) Acute Rehab PT Goals Patient Stated Goal: To go home PT Goal Formulation: With patient Time For Goal Achievement: 10/30/22 Progress towards PT goals: Progressing toward goals (slowly)    Frequency    Min 3X/week      PT Plan Current plan remains appropriate    Co-evaluation              AM-PAC PT "6 Clicks" Mobility   Outcome Measure  Help needed turning from your back to your side while in a flat bed without using bedrails?: None Help needed  moving from lying on your back to sitting on the side of a flat bed without using bedrails?: A Little Help needed moving to and from a bed to a chair (including a wheelchair)?: A Lot (via lateral scoot to drop arm surface) Help needed standing up from a chair using your arms (e.g., wheelchair or bedside chair)?: Total Help needed to walk in hospital room?: Total Help needed climbing 3-5 steps with a railing? : Total 6 Click Score: 12    End of Session   Activity Tolerance: Treatment limited secondary to agitation (minimal participation due to agitation, pt easily frustrated but with encouragement will participate somewhat) Patient left: in bed;with call bell/phone within reach;with bed alarm set;Other (comment) (RLE elevated on folded blanket with heel floated, RN notified he may do better with larger pillow if one can be found. Pt refusing to  allow PTA to place prevalon boot) Nurse Communication: Mobility status;Precautions;Other (comment) (needs RLE heel offloading shoe and may need WB restrictions given his large wound? attending DO [place] Crane messaged to see if doctor can clarify whether he needs restrictions or not.) PT Visit Diagnosis: Other abnormalities of gait and mobility (R26.89);Muscle weakness (generalized) (M62.81);History of falling (Z91.81)     Time: 1062-6948 PT Time Calculation (min) (ACUTE ONLY): 13 min  Charges:  $Therapeutic Activity: 8-22 mins                     Jerzy Roepke P., PTA Acute Rehabilitation Services Secure Chat Preferred 9a-5:30pm Office: [place] 10/19/2022, 5:42 PM

## 2022-10-19 NOTE — Progress Notes (Signed)
Mobility Specialist Progress Note    10/19/22 1159  Mobility  Activity  (wheelchair mobility)  Level of Assistance Dependent, patient does less than 25%  Assistive Device Wheelchair  Distance Ambulated (ft) 300 ft  Activity Response Tolerated well  Mobility Referral Yes  $Mobility charge 1 Mobility   Pre-Mobility: 107 HR, 96% SpO2 Post-Mobility: 111 HR  Pt received in bed and agreeable. No complaints. Declined doing a scoot transfer, only agreed to squat pivot. Took a few rest breaks. Returned to bed with call bell in reach and alarm on.   Hildred Alamin Mobility Specialist  Please Psychologist, sport and exercise or Rehab Office at [phone]

## 2022-10-20 DIAGNOSIS — J1569 Pneumonia due to other gram-negative bacteria: Secondary | ICD-10-CM | POA: Diagnosis not present

## 2022-10-20 DIAGNOSIS — F1010 Alcohol abuse, uncomplicated: Secondary | ICD-10-CM | POA: Diagnosis not present

## 2022-10-20 DIAGNOSIS — E871 Hypo-osmolality and hyponatremia: Secondary | ICD-10-CM

## 2022-10-20 DIAGNOSIS — J9601 Acute respiratory failure with hypoxia: Secondary | ICD-10-CM | POA: Diagnosis not present

## 2022-10-20 LAB — CULTURE, BLOOD (ROUTINE X 2)
Culture: NO GROWTH
Culture: NO GROWTH
Special Requests: ADEQUATE
Special Requests: ADEQUATE

## 2022-10-20 LAB — BASIC METABOLIC PANEL
Anion gap: 10 (ref 5–15)
BUN: 6 mg/dL — ABNORMAL LOW (ref 8–23)
CO2: 28 mmol/L (ref 22–32)
Calcium: 8.9 mg/dL (ref 8.9–10.3)
Chloride: 93 mmol/L — ABNORMAL LOW (ref 98–111)
Creatinine, Ser: 0.48 mg/dL — ABNORMAL LOW (ref 0.61–1.24)
GFR, Estimated: >60 mL/min (ref >60)
Glucose, Bld: 104 mg/dL — ABNORMAL HIGH (ref 70–99)
Potassium: 3.9 mmol/L (ref 3.5–5.1)
Sodium: 131 mmol/L — ABNORMAL LOW (ref 135–145)

## 2022-10-20 LAB — CBC
HCT: 32.5 % — ABNORMAL LOW (ref 39.0–52.0)
Hemoglobin: 10.7 g/dL — ABNORMAL LOW (ref 13.0–17.0)
MCH: 31.8 pg (ref 26.0–34.0)
MCHC: 32.9 g/dL (ref 30.0–36.0)
MCV: 96.4 fL (ref 80.0–100.0)
Platelets: 395 10*3/uL (ref 150–400)
RBC: 3.37 MIL/uL — ABNORMAL LOW (ref 4.22–5.81)
RDW: 17.8 % — ABNORMAL HIGH (ref 11.5–15.5)
WBC: 13.2 10*3/uL — ABNORMAL HIGH (ref 4.0–10.5)
nRBC: 0 % (ref 0.0–0.2)

## 2022-10-20 LAB — GLUCOSE, CAPILLARY
Glucose-Capillary: 137 mg/dL — ABNORMAL HIGH (ref 70–99)
Glucose-Capillary: 122 mg/dL — ABNORMAL HIGH (ref 70–99)
Glucose-Capillary: 118 mg/dL — ABNORMAL HIGH (ref 70–99)
Glucose-Capillary: 128 mg/dL — ABNORMAL HIGH (ref 70–99)

## 2022-10-20 MED ORDER — AMOXICILLIN-POT CLAVULANATE 875-125 MG PO TABS
1.0000 | ORAL_TABLET | Freq: Two times a day (BID) | ORAL | Status: DC
  Administered 2022-10-20 – 2022-10-26 (×14): 1 via ORAL
  Filled 2022-10-20 (×15): qty 1

## 2022-10-20 NOTE — Evaluation (Signed)
Occupational Therapy Evaluation Patient Details Name: Ronald [name] MRN: [id number] DOB: May 03, 1956 Today's Date: 10/20/2022   History of Present Illness This is a 67 year old gentleman admitted 2/2 after being found down at home after a recent d/c from hospital where he refused SNF. Pt was hyponatremic hypoxemic hypercarbic was placed on BiPAP in the emergency room.  Ultimately failed BiPAP requiring intubation mechanical ventilation 2/2--2/3. PMH: polysubstance abuse, cocaine, alcohol use.   Clinical Impression   PTA, pt reports he lived at home alone and was independent, but has friends who can come by to assist if needed. Upon eval, pt requires min A for LB ADL and set-up for UB ADL. Pt with decreased UE strength noted during simulated lateral scoot transfers along EOB and requiring up to min A for bottom clearance from EOB for effective scoot. Pt now with NWB precautions RLE, and reporting he normally squat pivots to chair at home, and unaware if his chair has drop arm. Will need further education regarding transfers maintaining NWB precautions. Due to current need for assist, recommending SNF for continued OT services to optimize safety and independence in ADL and IADL.      Recommendations for follow up therapy are one component of a multi-disciplinary discharge planning process, led by the attending physician.  Recommendations may be updated based on patient status, additional functional criteria and insurance authorization.   Follow Up Recommendations  Skilled nursing-short term rehab (<3 hours/day)     Assistance Recommended at Discharge Frequent or constant Supervision/Assistance  Patient can return home with the following A little help with walking and/or transfers;A little help with bathing/dressing/bathroom;Assistance with cooking/housework;Direct supervision/assist for financial management;Direct supervision/assist for medications management;Help with stairs or ramp for  entrance    Functional Status Assessment  Patient has had a recent decline in their functional status and demonstrates the ability to make significant improvements in function in a reasonable and predictable amount of time.  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 Mild instability but pt corrects with UE support and bed rail. Cues to scoot up in bed prior to supine to avoid need to weightbear through RLE to scoot body up in bed    Transfers Overall transfer level: Needs assistance Equipment used: None Transfers: Bed to chair/wheelchair/BSC            Lateral/Scoot Transfers: Min assist General transfer comment: simulated EOB. Pt reporting the arm of his wheelchair does not move for lateral scoot and he normally squat pivots; may need to perform anterior/posterior transfer for safety until WB restrictions are lifted      Balance Overall balance assessment: Needs assistance Sitting-balance support: Single extremity supported, Feet unsupported Sitting balance-Leahy Scale: Fair         Standing balance comment: Did not test -pt does not have proper footwear  in room currently given large R heel wound                           ADL either performed or assessed with  clinical judgement   ADL Overall ADL's : Needs assistance/impaired Eating/Feeding: Independent Eating/Feeding Details (indicate cue type and reason): Drinking from cup with straw and opening crackers and self feeding at bed level independently Grooming: Set up;Sitting   Upper Body Bathing: Set up;Sitting   Lower Body Bathing: Sitting/lateral leans;Minimal assistance   Upper Body Dressing : Set up;Sitting   Lower Body Dressing: Sitting/lateral leans;Minimal assistance     Toilet Transfer Details (indicate cue type and reason): refused transfer         Functional mobility during ADLs: Minimal assistance (lateral scoots along EOB) General ADL Comments: sat edge of bed 8 minutes with therapist.     Vision   Vision Assessment?: No apparent visual deficits     Perception     Praxis      Pertinent Vitals/Pain Pain Assessment Pain Assessment: Faces Faces Pain Scale: Hurts even more Pain Location: R heel, generalized Pain Descriptors / Indicators: Grimacing, Guarding, Moaning Pain Intervention(s): Limited activity within patient's tolerance, Monitored during session     Hand Dominance Right   Extremity/Trunk Assessment Upper Extremity Assessment Upper Extremity Assessment: Generalized weakness   Lower Extremity Assessment Lower Extremity Assessment: Defer to PT evaluation   Cervical / Trunk Assessment Cervical / Trunk Assessment: Normal   Communication Communication Communication: No difficulties   Cognition Arousal/Alertness: Awake/alert Behavior During Therapy: Impulsive, Flat affect Overall Cognitive Status: No family/caregiver present to determine baseline cognitive functioning Area of Impairment: Following commands, Safety/judgement, Problem solving, Awareness                       Following Commands: Follows multi-step commands inconsistently, Follows one step commands with increased time Safety/Judgement: Decreased awareness of safety Awareness:  Intellectual, Emergent Problem Solving: Difficulty sequencing, Requires verbal cues General Comments: Pt A&O, but decresed insight into current level of function reporting that he can move independently, but with scooting in bed, decreased UE strength notable and very small scoots that would not be large or powerful enough to transfer to chair while maintaining NWB precautions. Pt also with max attempts to use RLE to scoot up in bed. Pt aware of thickened liquids diet and agreeable to thickened liquids with encouragement. Agreeable to session overall, but decreased interested in activities no intrinsically motivating such as donning sock.     General Comments       Exercises     Shoulder Instructions      Home Living Family/patient expects to be discharged to:: Private residence Living Arrangements: Non-relatives/Friends Available Help at Discharge: Available 24 hours/day Type of Home: House Home Access: Ramped entrance     Home Layout: One level     Bathroom Shower/Tub: Occupational psychologist: Standard     Home Equipment: BSC/3in1;Wheelchair - manual   Additional Comments: prosthetic doesnt fit per pt      Prior Functioning/Environment Prior Level of Function : Patient poor historian/Family not available;Other (comment) (unsure, as per chart, pt requires  assist, but per pt, he was independent)             Mobility Comments: Reports he could transfer himself in and out of wc ADLs Comments: reports IND; sponge bathes at baseline        OT Problem List:  Decreased strength;Decreased range of motion;Impaired balance (sitting and/or standing);Decreased activity tolerance;Decreased cognition;Decreased safety awareness;Decreased knowledge of use of DME or AE      OT Treatment/Interventions: Self-care/ADL training;Therapeutic exercise;DME and/or AE instruction;Energy conservation;Therapeutic activities;Patient/family education;Balance training    OT  Goals(Current goals can be found in the care plan section) Acute Rehab OT Goals Patient Stated Goal: get a cola and some crackers OT Goal Formulation: With patient Time For Goal Achievement: 11/03/22 Potential to Achieve Goals: Good  OT Frequency: Min 2X/week    Co-evaluation              AM-PAC OT "6 Clicks" Daily Activity     Outcome Measure Help from another person eating meals?: None Help from another person taking care of personal grooming?: A Little Help from another person toileting, which includes using toliet, bedpan, or urinal?: A Little Help from another person bathing (including washing, rinsing, drying)?: A Little Help from another person to put on and taking off regular upper body clothing?: A Little Help from another person to put on and taking off regular lower body clothing?: A Little 6 Click Score: 19   End of Session Nurse Communication: Mobility status  Activity Tolerance: Patient tolerated treatment well Patient left: in bed;with call bell/phone within reach;with bed alarm set  OT Visit Diagnosis: Unsteadiness on feet (R26.81);Other abnormalities of gait and mobility (R26.89);Muscle weakness (generalized) (M62.81)                Time: 8413-2440 OT Time Calculation (min): 22 min Charges:  OT General Charges $OT Visit: 1 Visit OT Evaluation $OT Eval Moderate Complexity: 1 Mod  Elder Cyphers, OTR/L [facility] Acute Rehabilitation Office: [phone]   Magnus Ivan 10/20/2022, 10:02 AM

## 2022-10-20 NOTE — Progress Notes (Signed)
Physical Therapy Treatment Patient Details Name: Ronald [name] MRN: [id number] DOB: [birth year] Today's Date: 10/20/2022   History of Present Illness This is a 67 year old gentleman admitted 2/2 after being found down at home after a recent d/c from hospital where he refused SNF. Pt was hyponatremic hypoxemic hypercarbic was placed on BiPAP in the emergency room. Ultimately failed BiPAP requiring intubation mechanical ventilation 2/2--2/3. 2/6: PTA discussed with DO pt R heel ulcer (POA) and pt NWB as of 2/7. PMH: polysubstance abuse, cocaine, alcohol use, R heel pressure injury.    PT Comments    Pt received in supine, agreeable to therapy session and with fair participation in supine exercises for strengthening. Pt instructed on IS use and pulling ~200-347mL max and would benefit from amputee HEP handout next session to reinforce exercises that he performed. Pt c/o RUE discomfort and edema observed around IV site, RN notified. Pt defers EOB/OOB mobility due to RUE pain so he was given visual/verbal demo on safe technique for A/P transfers and pt amenable to trying transfer OOB to wheelchair next session. Pt continues to benefit from PT services to progress toward functional mobility goals.   Recommendations for follow up therapy are one component of a multi-disciplinary discharge planning process, led by the attending physician.  Recommendations may be updated based on patient status, additional functional criteria and insurance authorization.  Follow Up Recommendations  Skilled nursing-short term rehab (<3 hours/day) (pt likely to refuse, would benefit from max [facility] services if he instead goes home) Can patient physically be transported by private vehicle: No   Assistance Recommended at Discharge Frequent or constant Supervision/Assistance  Patient can return home with the following Assistance with cooking/housework;Direct supervision/assist for financial management;Direct supervision/assist for  medications management;Assist for transportation;A little help with bathing/dressing/bathroom;A lot of help with walking and/or transfers;Help with stairs or ramp for entrance   Equipment Recommendations  Wheelchair (measurements PT);Wheelchair cushion (measurements PT);Other (comment) (some type of heel offloading shoe; drop arm wheelchair (he states his does not have removable arm rests) slide board for car transfers)    Recommendations for Other Services OT consult (education on transfers given R heel wound and pt may need further cognitive work-up to see if he can manage his own meds safely)     Precautions / Restrictions Precautions Precautions: Fall Precaution Comments: 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) and large R heel wound (prevalon boot) Restrictions Weight Bearing Restrictions: Yes RLE Weight Bearing: Non weight bearing Other Position/Activity Restrictions: NWB orders now in chart; no [employer] shoe orders, but evaluating PT and PTA have asked for one     Mobility  Bed Mobility Overal bed mobility: Needs Assistance Bed Mobility: Rolling Rolling: Min guard         General bed mobility comments: cues for technique and to maintain RLE NWB while rolling to his R for placement of pillow to offload pressure; pt using bed rails    Transfers                   General transfer comment: pt refusing to attempt at time of session, visual/verbal demo given.       Balance Overall balance assessment: Needs assistance     Sitting balance - Comments: pt defers at time of session       Standing balance comment: N/A- now NWB on RLE and no prosthetic for his LLE in the room  Cognition Arousal/Alertness: Awake/alert Behavior During Therapy: Impulsive, Flat affect Overall Cognitive Status: No family/caregiver present to determine baseline cognitive functioning Area of Impairment: Following commands, Safety/judgement, Problem  solving, Awareness                       Following Commands: Follows multi-step commands inconsistently, Follows one step commands with increased time Safety/Judgement: Decreased awareness of safety Awareness: Intellectual, Emergent Problem Solving: Difficulty sequencing, Requires verbal cues General Comments: Pt A&O, but decresed insight into current level of function reporting that he can move independently. Pt receptive to instruction/demo on safe transfer technique but defers to attempt at time of session due to RUE pain.        Exercises Amputee Exercises Quad Sets: AROM, Both, 10 reps, Supine Hip Extension: AROM, Left, 10 reps, Supine Hip ABduction/ADduction: AROM, AAROM, Both, 10 reps, Supine Straight Leg Raises: AROM, Left, 10 reps, Supine Other Exercises Other Exercises: LLE hip abduction low load stretch x30 sec in supine Other Exercises: IS x 10 reps (~200-300 mL) Other Exercises: pt defers seated BLE/BUE exercises due to c/o RUE pain/edema near IV site    General Comments General comments (skin integrity, edema, etc.): Pt wearing prevalon boot upon PTA arrival to room and did not attempt to remove it, pt initially defers extra pillow for pressure relief but when PTA brought one to room, he agreed to have it placed behind his L hip. RN entering room to assess his RUE where it is swollen near IV site at end of session.      Pertinent Vitals/Pain Pain Assessment Pain Assessment: 0-10 Faces Pain Scale: Hurts whole lot Pain Location: RUE inside elbow (edema/redness observed as well) Pain Descriptors / Indicators: Grimacing, Guarding, Sore, Tender Pain Intervention(s): Monitored during session, Limited activity within patient's tolerance, Repositioned, Patient requesting pain meds-RN notified, Other (comment) (RN notified and coming to room to assess this at end of session)           PT Goals (current goals can now be found in the care plan section) Acute Rehab PT  Goals Patient Stated Goal: To go home PT Goal Formulation: With patient Time For Goal Achievement: 10/30/22 Progress towards PT goals: Progressing toward goals (slowly)    Frequency    Min 3X/week      PT Plan Current plan remains appropriate       AM-PAC PT "6 Clicks" Mobility   Outcome Measure  Help needed turning from your back to your side while in a flat bed without using bedrails?: A Little Help needed moving from lying on your back to sitting on the side of a flat bed without using bedrails?: A Little Help needed moving to and from a bed to a chair (including a wheelchair)?: A Lot (via lateral scoot to drop arm surface) Help needed standing up from a chair using your arms (e.g., wheelchair or bedside chair)?: Total Help needed to walk in hospital room?: Total Help needed climbing 3-5 steps with a railing? : Total 6 Click Score: 11    End of Session   Activity Tolerance: Patient tolerated treatment well;Patient limited by pain;Other (comment) (RUE pain) Patient left: in bed;with call bell/phone within reach;with bed alarm set;Other (comment);with nursing/sitter in room (pillow under L hip to offload) Nurse Communication: Mobility status;Other (comment);Weight bearing status (pt c/o RUE pain around IV and it appears swollen; AP transfers for now) PT Visit Diagnosis: Other abnormalities of gait and mobility (R26.89);Muscle weakness (generalized) (M62.81);History of falling (Z91.81)  Time: 7741-4239 PT Time Calculation (min) (ACUTE ONLY): 22 min  Charges:  $Therapeutic Exercise: 8-22 mins                     Currie Dennin P., PTA Acute Rehabilitation Services Secure Chat Preferred 9a-5:30pm Office: [place] 10/20/2022, 5:44 PM

## 2022-10-20 NOTE — Progress Notes (Signed)
PROGRESS NOTE  [name] BOF:[id number] DOB: 01/07/1956 DOA: 10/15/2022 PCP: Demetrios Isaacs, FNP  HPI/Recap of past 36 hours: 67 year old male who is known polysubstance abuse, EtOH abuse cocaine abuse, noncompliance, unstageable pressure injury to right heel, peripheral vascular disease, who presents from home via EMS with altered mental status along with noncompliance. Recently admitted and discharged with concern that he needed to go to a skilled nursing facility for which he refused. He now presents with a sodium of 125, altered mental status, hypoxic and hypercarbic, and noted to be aspirating while on noninvasive mechanical ventilatory support requiring intubation.  Admitted to ICU.  EEG was negative for seizures.  Was started on IV antibiotics due to concern for aspiration.  Extubated on 10/16/2022.  Transferred to [facility] service on 10/18/2022.   Assessment/Plan: Principal Problem:   Respiratory failure (HCC) Active Problems:   Alcohol abuse  Acute hypercarbic/hypoxic respiratory failure status postextubation 10/16/2022.   History of COPD and chronic alcohol abuse  Currently on Unasyn due to concern for aspiration.   - switch to Augmentin on 10/20/2022 to complete 7 days of antibiotics.   Continue DuoNebs  Continue incentive spirometer, pulmonary hygiene.  -dys diet (patient currently refusing)  Proteus mirabilis pneumonia, POA Proteus mirabilis grew from tracheal aspirate Sensitive to Unasyn, continue   Acute on Chronic Hyponatremia in the setting of beer potomania  On admission serum sodium 112 Currently serum sodium 130, stable. Encourage oral intake   Dysphagia  Seen by speech therapist on dysphagia 3 diet, nectar thick liquid, home meds with pure, per speech therapist Continue aspiration precautions   Resolved post repletion: Hypomagnesia.  Resolved post repletion: Hypokalemia.   Prediabetes, blood sugars are well-controlled A1c 5.6 on 09/02/2022.   Chronic EtOH  abuse with concern for withdrawal Cocaine Abuse  UDS positive for cocaine on 09/20/2022. Continue folic acid supplement, thiamine supplement, multivitamins, out of window for alcohol withdrawal.   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 -unfortunately patient is not interested in following the advice of keeping heel off bed/NWB   -refusing SNF-- does have poor insight but currently appears to retain capacity-- sister updated on phone and these issues have been going on for a long time    Time 50 minutes.     Labs   CBC: Last Labs          Recent Labs  Lab 10/15/22 0636 10/15/22 7782 10/15/22 0903 10/15/22 1148 10/16/22 0718 10/17/22 0602  WBC 15.8*  --   --   --  14.7* 13.8*  HGB 11.2* 12.6* 11.2* 11.9* 8.8* 9.0*  HCT 31.1* 37.0* 33.0* 35.0* 25.7* 26.7*  MCV 88.9  --   --   --  91.8 94.7  PLT 423*  --   --   --  418* 384        Code Status: Full code  Family Communication: called sister  Disposition Plan:   Patient has declined SNF placement.   Consultants: PCCM.   Status is: Inpatient The patient required at least 2 midnights for further evaluation and treatment of present condition.  Subjective: Declining prevlon boot Declining to follow diet of thickened liquids  Objective: Vitals:   10/19/22 2028 10/20/22 0100 10/20/22 0500 10/20/22 0733  BP: (!) 140/87 123/65  (!) 141/84  Pulse: 100 96  97  Resp: (!) 24 (!) 25  (!) 21  Temp: 98.8 F (37.1 C) 97.6 F (36.4 C)  98.2 F (36.8 C)  TempSrc: Oral   Oral  SpO2: 96% 93%  95%  Weight:   61.1 kg   Height:        Intake/Output Summary (Last 24 hours) at 10/20/2022 0957 Last data filed at 10/20/2022 0850 Gross per 24 hour  Intake 1260 ml  Output 2226 ml  Net -966 ml   Filed Weights   10/17/22 0500 10/19/22 0500 10/20/22 0500   Weight: 60.9 kg 61 kg 61.1 kg    Exam:   General: Appearance:    Well developed, well nourished male in no acute distress     Lungs:     respirations unlabored  Heart:    Normal heart rate. Normal rhythm. No murmurs, rubs, or gallops.      Neurologic:   Awake, alert,poor insight into disease process      Data Reviewed: CBC: Recent Labs  Lab 10/16/22 0718 10/17/22 0602 10/18/22 0306 10/19/22 0357 10/20/22 0428  WBC 14.7* 13.8* 14.2* 15.6* 13.2*  HGB 8.8* 9.0* 9.4* 10.8* 10.7*  HCT 25.7* 26.7* 27.6* 31.2* 32.5*  MCV 91.8 94.7 93.9 93.7 96.4  PLT 418* 384 402* 398 395   Basic Metabolic Panel: Recent Labs  Lab 10/15/22 1627 10/15/22 2236 10/16/22 0718 10/16/22 1332 10/16/22 2300 10/17/22 0602 10/18/22 0306 10/19/22 0357 10/20/22 0428  NA 122*   < > 125*   < > 128* 131* 130* 130* 131*  K 3.9   < > 3.9   < > 3.9 3.6 3.4* 3.6 3.9  CL 80*   < > 88*   < > 90* 90* 90* 91* 93*  CO2 25   < > 30   < > 32 32 31 32 28  GLUCOSE 111*   < > 132*   < > 126* 94 100* 98 104*  BUN 5*   < > 7*   < > 8 6* <5* <5* 6*  CREATININE 0.52*   < > 0.58*   < > 0.63 0.45* 0.52* 0.50* 0.48*  CALCIUM 8.7*   < > 8.9   < > 8.4* 8.3* 8.4* 9.0 8.9  MG 1.4*  --  1.5*  --   --  1.7 1.9 1.7  --   PHOS 3.5  --  2.4*  --   --  4.2 3.7 3.4  --    < > = values in this interval not displayed.   GFR: Estimated Creatinine Clearance: 79.6 mL/min (A) (by C-G formula based on SCr of 0.48 mg/dL (L)). Liver Function Tests: Recent Labs  Lab 10/15/22 1135  AST 21  ALT 10  ALKPHOS 91  BILITOT 1.4*  PROT 7.1  ALBUMIN 3.8   No results for input(s): "LIPASE", "AMYLASE" in the last 168 hours. No results for input(s): "AMMONIA" in the last 168 hours. Coagulation Profile: No results for input(s): "INR", "PROTIME" in the last 168 hours. Cardiac Enzymes: No results for input(s): "CKTOTAL", "CKMB", "CKMBINDEX", "TROPONINI" in the last 168 hours. BNP (last 3 results) No results for input(s): "PROBNP" in the  last 8760 hours. HbA1C: No results for input(s): "HGBA1C" in the last 72 hours. CBG: Recent Labs  Lab 10/18/22 1143 10/18/22 2229 10/19/22 1850 10/19/22 2239 10/20/22 0824  GLUCAP 108* 147* 146* 133* 137*   Lipid Profile: No results for input(s): "CHOL", "HDL", "LDLCALC", "TRIG", "CHOLHDL", "LDLDIRECT" in the last 72 hours.  Thyroid Function Tests: No results for input(s): "TSH", "T4TOTAL", "FREET4", "T3FREE", "THYROIDAB" in  the last 72 hours. Anemia Panel: No results for input(s): "VITAMINB12", "FOLATE", "FERRITIN", "TIBC", "IRON", "RETICCTPCT" in the last 72 hours. Urine analysis:    Component Value Date/Time   COLORURINE STRAW (A) 09/20/2022 0229   APPEARANCEUR CLEAR 09/20/2022 0229   LABSPEC 1.003 (L) 09/20/2022 0229   PHURINE 7.0 09/20/2022 0229   GLUCOSEU NEGATIVE 09/20/2022 0229   HGBUR NEGATIVE 09/20/2022 0229   BILIRUBINUR NEGATIVE 09/20/2022 0229   KETONESUR NEGATIVE 09/20/2022 0229   PROTEINUR NEGATIVE 09/20/2022 0229   NITRITE NEGATIVE 09/20/2022 0229   LEUKOCYTESUR NEGATIVE 09/20/2022 0229    Recent Results (from the past 240 hour(s))  Resp panel by RT-PCR (RSV, Flu A&B, Covid) Anterior Nasal Swab     Status: None   Collection Time: 10/15/22  6:36 AM   Specimen: Anterior Nasal Swab  Result Value Ref Range Status   SARS Coronavirus 2 by RT PCR NEGATIVE NEGATIVE Final   Influenza A by PCR NEGATIVE NEGATIVE Final   Influenza B by PCR NEGATIVE NEGATIVE Final    Comment: (NOTE) The Xpert Xpress SARS-CoV-2/FLU/RSV plus assay is intended as an aid in the diagnosis of influenza from Nasopharyngeal swab specimens and should not be used as a sole basis for treatment. Nasal washings and aspirates are unacceptable for Xpert Xpress SARS-CoV-2/FLU/RSV testing.  Fact Sheet for Patients: EntrepreneurPulse.com.au  Fact Sheet for Healthcare Providers: IncredibleEmployment.be  This test is not yet approved or cleared by the [place] FDA and has been authorized for detection and/or diagnosis of SARS-CoV-2 by FDA under an Emergency Use Authorization (EUA). This EUA will remain in effect (meaning this test can be used) for the duration of the COVID-19 declaration under Section 564(b)(1) of the Act, 21 U.S.C. section 360bbb-3(b)(1), unless the authorization is terminated or revoked.     Resp Syncytial Virus by PCR NEGATIVE NEGATIVE Final    Comment: (NOTE) Fact Sheet for Patients: EntrepreneurPulse.com.au  Fact Sheet for Healthcare Providers: IncredibleEmployment.be  This test is not yet approved or cleared by the [place] FDA and has been authorized for detection and/or diagnosis of SARS-CoV-2 by FDA under an Emergency Use Authorization (EUA). This EUA will remain in effect (meaning this test can be used) for the duration of the COVID-19 declaration under Section 564(b)(1) of the Act, 21 U.S.C. section 360bbb-3(b)(1), unless the authorization is terminated or revoked.  Performed at [facility], [place] [address] [place], [place] [address]   MRSA Next Gen by PCR, Nasal     Status: Abnormal   Collection Time: 10/15/22 11:09 AM  Result Value Ref Range Status   MRSA by PCR Next Gen DETECTED (A) NOT DETECTED Final    Comment: RESULT CALLED TO, READ BACK BY AND VERIFIED WITH: RN RPurvis Kilts [phone] @1603  FH (NOTE) The GeneXpert MRSA Assay (FDA approved for NASAL specimens only), is one component of a comprehensive MRSA colonization surveillance program. It is not intended to diagnose MRSA infection nor to guide or monitor treatment for MRSA infections. Test performance is not FDA approved in patients less than 11 years old. Performed at [facility], [place] [address] [place], [place] [address]   Culture, blood (Routine X 2) w Reflex to ID Panel     Status: None   Collection Time: 10/15/22 11:35 AM   Specimen: BLOOD LEFT HAND  Result Value Ref Range Status    Specimen Description BLOOD LEFT HAND  Final   Special Requests   Final    BOTTLES DRAWN AEROBIC ONLY Blood Culture adequate  volume   Culture   Final    NO GROWTH 5 DAYS Performed at [facility], [place] [address] [place], [place] [address]    Report Status 10/20/2022 FINAL  Final  Culture, blood (Routine X 2) w Reflex to ID Panel     Status: None   Collection Time: 10/15/22 11:35 AM   Specimen: BLOOD LEFT ARM  Result Value Ref Range Status   Specimen Description BLOOD LEFT ARM  Final   Special Requests IN PEDIATRIC BOTTLE Blood Culture adequate volume  Final   Culture   Final    NO GROWTH 5 DAYS Performed at [facility], [place] [address] [place], [place] [address]    Report Status 10/20/2022 FINAL  Final  Culture, Respiratory w Gram Stain (tracheal aspirate)     Status: None   Collection Time: 10/16/22  9:59 AM   Specimen: Tracheal Aspirate; Respiratory  Result Value Ref Range Status   Specimen Description TRACHEAL ASPIRATE  Final   Special Requests NONE  Final   Gram Stain   Final    FEW WBC PRESENT, PREDOMINANTLY PMN NO ORGANISMS SEEN Performed at [facility], [address], [place],  [address]    Culture RARE PROTEUS MIRABILIS  Final   Report Status 10/19/2022 FINAL  Final   Organism ID, Bacteria PROTEUS MIRABILIS  Final      Susceptibility   Proteus mirabilis - MIC*    AMPICILLIN <=2 SENSITIVE Sensitive     CEFAZOLIN <=4 SENSITIVE Sensitive     CEFEPIME <=0.12 SENSITIVE Sensitive     CEFTAZIDIME <=1 SENSITIVE Sensitive     CEFTRIAXONE <=0.25 SENSITIVE Sensitive     CIPROFLOXACIN <=0.25 SENSITIVE Sensitive     GENTAMICIN <=1 SENSITIVE Sensitive     IMIPENEM 8 INTERMEDIATE Intermediate     TRIMETH/SULFA <=20 SENSITIVE Sensitive     AMPICILLIN/SULBACTAM <=2 SENSITIVE Sensitive     PIP/TAZO <=4 SENSITIVE Sensitive     * RARE PROTEUS MIRABILIS      Studies: No results found.  Scheduled Meds:  Chlorhexidine Gluconate Cloth  6 each  Topical Q0600   Chlorhexidine Gluconate Cloth  6 each Topical Q0600   docusate  100 mg Per Tube BID   fluticasone furoate-vilanterol  1 puff Inhalation Daily   folic acid  1 mg Per Tube Daily   heparin  5,000 Units Subcutaneous Q8H   insulin aspart  0-6 Units Subcutaneous TID AC & HS   multivitamin with minerals  1 tablet Per Tube Daily   pantoprazole  40 mg Oral Daily   polyethylene glycol  17 g Per Tube Daily   thiamine  100 mg Per Tube Daily    Continuous Infusions:     LOS: 5 days     Geradine Girt, DO [facility]   If 7PM-7AM, please contact night-coverage [email or web address] Password [facility] 10/20/2022, 9:57 AM

## 2022-10-21 ENCOUNTER — Inpatient Hospital Stay (HOSPITAL_COMMUNITY): Payer: Medicare Other

## 2022-10-21 DIAGNOSIS — F1010 Alcohol abuse, uncomplicated: Secondary | ICD-10-CM | POA: Diagnosis not present

## 2022-10-21 DIAGNOSIS — J441 Chronic obstructive pulmonary disease with (acute) exacerbation: Secondary | ICD-10-CM | POA: Diagnosis not present

## 2022-10-21 DIAGNOSIS — J9601 Acute respiratory failure with hypoxia: Secondary | ICD-10-CM | POA: Diagnosis not present

## 2022-10-21 DIAGNOSIS — J1569 Pneumonia due to other gram-negative bacteria: Secondary | ICD-10-CM | POA: Diagnosis not present

## 2022-10-21 LAB — GLUCOSE, CAPILLARY
Glucose-Capillary: 128 mg/dL — ABNORMAL HIGH (ref 70–99)
Glucose-Capillary: 112 mg/dL — ABNORMAL HIGH (ref 70–99)
Glucose-Capillary: 117 mg/dL — ABNORMAL HIGH (ref 70–99)
Glucose-Capillary: 143 mg/dL — ABNORMAL HIGH (ref 70–99)

## 2022-10-21 MED ORDER — HYDROCODONE-ACETAMINOPHEN 5-325 MG PO TABS
1.0000 | ORAL_TABLET | Freq: Four times a day (QID) | ORAL | Status: DC | PRN
  Administered 2022-10-21 – 2022-11-15 (×34): 1 via ORAL
  Filled 2022-10-21 (×36): qty 1

## 2022-10-21 MED ORDER — ADULT MULTIVITAMIN W/MINERALS CH
1.0000 | ORAL_TABLET | Freq: Every day | ORAL | Status: DC
  Administered 2022-10-22 – 2022-12-03 (×42): 1 via ORAL
  Filled 2022-10-21 (×43): qty 1

## 2022-10-21 MED ORDER — NEPRO/CARBSTEADY PO LIQD
237.0000 mL | ORAL | Status: DC
  Administered 2022-10-21 – 2022-12-02 (×34): 237 mL via ORAL

## 2022-10-21 NOTE — TOC CAGE-AID Note (Signed)
Transition of Care [facility]) - CAGE-AID Screening   Patient Details  Name: Ronald [name] MRN: [id number] Date of Birth: 22-Apr-1956  [facility]) CM/SW Contact:    Joanne Chars, LCSW Phone Number: 10/21/2022, 2:19 PM   Clinical Narrative:  CSW met with pt to complete cage aid.  Pt reports prior to admission to hospital, daily drinking of at least 12 beers per day.  Pt also reports use of cocaine 1x per week.  Pt has not been to treatment before.  CSw provided contact information for residential substance abuse providers: [facility] and [facility].  Pt verbalized understanding.      CAGE-AID Screening:    Have You Ever Felt You Ought to Cut Down on Your Drinking or Drug Use?: No Have People Annoyed You By Critizing Your Drinking Or Drug Use?: No Have You Felt Bad Or Guilty About Your Drinking Or Drug Use?: No Have You Ever Had a Drink or Used Drugs First Thing In The Morning to Steady Your Nerves or to Get Rid of a Hangover?: Yes CAGE-AID Score: 1     Substance abuse interventions: Other (must comment) (residential treatment provider list provided)

## 2022-10-21 NOTE — Progress Notes (Signed)
PROGRESS NOTE  [name] ZOX:[id number] DOB: 09-28-1955 DOA: 10/15/2022 PCP: Demetrios Isaacs, FNP  HPI/Recap of past 26 hours: 67 year old male who is known polysubstance abuse, EtOH abuse cocaine abuse, noncompliance, unstageable pressure injury to right heel, peripheral vascular disease, who presents from home via EMS with altered mental status along with noncompliance. Recently admitted and discharged with concern that he needed to go to a skilled nursing facility for which he refused. He now presents with a sodium of 125, altered mental status, hypoxic and hypercarbic, and noted to be aspirating while on noninvasive mechanical ventilatory support requiring intubation.  Admitted to ICU.  EEG was negative for seizures.  Was started on IV antibiotics due to concern for aspiration.  Extubated on 10/16/2022.  Transferred to [facility] service on 10/18/2022. Currently agreeable to SNF   Assessment/Plan: Principal Problem:   Respiratory failure ([place]) Active Problems:   Alcohol abuse  Acute hypercarbic/hypoxic respiratory failure status postextubation 10/16/2022.   History of COPD and chronic alcohol abuse  Currently on Unasyn due to concern for aspiration.   - switch to Augmentin on 10/20/2022 to complete 7 days of antibiotics.   Continue DuoNebs  Continue incentive spirometer, pulmonary hygiene.  -dys diet (patient currently refusing)  Proteus mirabilis pneumonia, POA Proteus mirabilis grew from tracheal aspirate Sensitive to Unasyn, continue   Acute on Chronic Hyponatremia in the setting of beer potomania  On admission serum sodium 112 Currently serum sodium 130, stable. Encourage oral intake   Dysphagia  Seen by speech therapist on dysphagia 3 diet, nectar thick liquid, home meds with pure, per speech therapist Continue aspiration precautions   Resolved post repletion: Hypomagnesia.  Resolved post repletion: Hypokalemia.   Prediabetes, blood sugars are well-controlled A1c 5.6 on  09/02/2022.   Chronic EtOH abuse with concern for withdrawal Cocaine Abuse  UDS positive for cocaine on 09/20/2022. Continue folic acid supplement, thiamine supplement, multivitamins, out of window for alcohol withdrawal.   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 -unfortunately patient is not interested in following the advice of keeping heel off bed/NWB  Right elbow swelling -no fracture -?cellulitis vs thrombophlebitis -on abx -monitor   Agreeable to SNF   Time 50 minutes.     Labs   CBC: Last Labs          Recent Labs  Lab 10/15/22 0636 10/15/22 8119 10/15/22 0903 10/15/22 1148 10/16/22 0718 10/17/22 0602  WBC 15.8*  --   --   --  14.7* 13.8*  HGB 11.2* 12.6* 11.2* 11.9* 8.8* 9.0*  HCT 31.1* 37.0* 33.0* 35.0* 25.7* 26.7*  MCV 88.9  --   --   --  91.8 94.7  PLT 423*  --   --   --  418* 384        Code Status: Full code  Family Communication: called sister  Disposition Plan:  agreeable to SNF   Consultants: PCCM.     Subjective: C/o right elbow pain  Objective: Vitals:   10/20/22 1943 10/21/22 0454 10/21/22 0756 10/21/22 0800  BP: 135/82 (!) 146/86  (!) 157/83  Pulse: 95 94  99  Resp: 20 20  18   Temp: 98.8 F (37.1 C)     TempSrc: Oral     SpO2: 98% 98% 95% 99%  Weight:      Height:        Intake/Output Summary (Last  24 hours) at 10/21/2022 1042 Last data filed at 10/21/2022 0750 Gross per 24 hour  Intake 240 ml  Output 800 ml  Net -560 ml   Filed Weights   10/17/22 0500 10/19/22 0500 10/20/22 0500  Weight: 60.9 kg 61 kg 61.1 kg    Exam:    General: Appearance:    Well developed, well nourished male in no acute distress     Lungs:     Clear to auscultation bilaterally, respirations unlabored  Heart:    Normal heart rate. Normal rhythm. No murmurs, rubs, or  gallops.      Neurologic:   Awake, alert      Data Reviewed: CBC: Recent Labs  Lab 10/16/22 0718 10/17/22 0602 10/18/22 0306 10/19/22 0357 10/20/22 0428  WBC 14.7* 13.8* 14.2* 15.6* 13.2*  HGB 8.8* 9.0* 9.4* 10.8* 10.7*  HCT 25.7* 26.7* 27.6* 31.2* 32.5*  MCV 91.8 94.7 93.9 93.7 96.4  PLT 418* 384 402* 398 395   Basic Metabolic Panel: Recent Labs  Lab 10/15/22 1627 10/15/22 2236 10/16/22 0718 10/16/22 1332 10/16/22 2300 10/17/22 0602 10/18/22 0306 10/19/22 0357 10/20/22 0428  NA 122*   < > 125*   < > 128* 131* 130* 130* 131*  K 3.9   < > 3.9   < > 3.9 3.6 3.4* 3.6 3.9  CL 80*   < > 88*   < > 90* 90* 90* 91* 93*  CO2 25   < > 30   < > 32 32 31 32 28  GLUCOSE 111*   < > 132*   < > 126* 94 100* 98 104*  BUN 5*   < > 7*   < > 8 6* <5* <5* 6*  CREATININE 0.52*   < > 0.58*   < > 0.63 0.45* 0.52* 0.50* 0.48*  CALCIUM 8.7*   < > 8.9   < > 8.4* 8.3* 8.4* 9.0 8.9  MG 1.4*  --  1.5*  --   --  1.7 1.9 1.7  --   PHOS 3.5  --  2.4*  --   --  4.2 3.7 3.4  --    < > = values in this interval not displayed.   GFR: Estimated Creatinine Clearance: 79.6 mL/min (A) (by C-G formula based on SCr of 0.48 mg/dL (L)). Liver Function Tests: Recent Labs  Lab 10/15/22 1135  AST 21  ALT 10  ALKPHOS 91  BILITOT 1.4*  PROT 7.1  ALBUMIN 3.8   No results for input(s): "LIPASE", "AMYLASE" in the last 168 hours. No results for input(s): "AMMONIA" in the last 168 hours. Coagulation Profile: No results for input(s): "INR", "PROTIME" in the last 168 hours. Cardiac Enzymes: No results for input(s): "CKTOTAL", "CKMB", "CKMBINDEX", "TROPONINI" in the last 168 hours. BNP (last 3 results) No results for input(s): "PROBNP" in the last 8760 hours. HbA1C: No results for input(s): "HGBA1C" in the last 72 hours. CBG: Recent Labs  Lab 10/20/22 0824 10/20/22 1120 10/20/22 1658 10/20/22 2138 10/21/22 0812  GLUCAP 137* 122* 118* 128* 128*   Lipid Profile: No results for input(s): "CHOL",  "HDL", "LDLCALC", "TRIG", "CHOLHDL", "LDLDIRECT" in the last 72 hours.  Thyroid Function Tests: No results for input(s): "TSH", "T4TOTAL", "FREET4", "T3FREE", "THYROIDAB" in the last 72 hours. Anemia Panel: No results for input(s): "VITAMINB12", "FOLATE", "FERRITIN", "TIBC", "IRON", "RETICCTPCT" in the last 72 hours. Urine analysis:    Component Value Date/Time   COLORURINE STRAW (A) 09/20/2022 0229   APPEARANCEUR CLEAR 09/20/2022 0229  LABSPEC 1.003 (L) 09/20/2022 0229   PHURINE 7.0 09/20/2022 0229   GLUCOSEU NEGATIVE 09/20/2022 0229   HGBUR NEGATIVE 09/20/2022 0229   BILIRUBINUR NEGATIVE 09/20/2022 0229   KETONESUR NEGATIVE 09/20/2022 0229   PROTEINUR NEGATIVE 09/20/2022 0229   NITRITE NEGATIVE 09/20/2022 0229   LEUKOCYTESUR NEGATIVE 09/20/2022 0229    Recent Results (from the past 240 hour(s))  Resp panel by RT-PCR (RSV, Flu A&B, Covid) Anterior Nasal Swab     Status: None   Collection Time: 10/15/22  6:36 AM   Specimen: Anterior Nasal Swab  Result Value Ref Range Status   SARS Coronavirus 2 by RT PCR NEGATIVE NEGATIVE Final   Influenza A by PCR NEGATIVE NEGATIVE Final   Influenza B by PCR NEGATIVE NEGATIVE Final    Comment: (NOTE) The Xpert Xpress SARS-CoV-2/FLU/RSV plus assay is intended as an aid in the diagnosis of influenza from Nasopharyngeal swab specimens and should not be used as a sole basis for treatment. Nasal washings and aspirates are unacceptable for Xpert Xpress SARS-CoV-2/FLU/RSV testing.  Fact Sheet for Patients: EntrepreneurPulse.com.au  Fact Sheet for Healthcare Providers: IncredibleEmployment.be  This test is not yet approved or cleared by the [place] FDA and has been authorized for detection and/or diagnosis of SARS-CoV-2 by FDA under an Emergency Use Authorization (EUA). This EUA will remain in effect (meaning this test can be used) for the duration of the COVID-19 declaration under Section 564(b)(1) of  the Act, 21 U.S.C. section 360bbb-3(b)(1), unless the authorization is terminated or revoked.     Resp Syncytial Virus by PCR NEGATIVE NEGATIVE Final    Comment: (NOTE) Fact Sheet for Patients: EntrepreneurPulse.com.au  Fact Sheet for Healthcare Providers: IncredibleEmployment.be  This test is not yet approved or cleared by the [place] FDA and has been authorized for detection and/or diagnosis of SARS-CoV-2 by FDA under an Emergency Use Authorization (EUA). This EUA will remain in effect (meaning this test can be used) for the duration of the COVID-19 declaration under Section 564(b)(1) of the Act, 21 U.S.C. section 360bbb-3(b)(1), unless the authorization is terminated or revoked.  Performed at [facility], [place] [address] [place], [place] [address]   MRSA Next Gen by PCR, Nasal     Status: Abnormal   Collection Time: 10/15/22 11:09 AM  Result Value Ref Range Status   MRSA by PCR Next Gen DETECTED (A) NOT DETECTED Final    Comment: RESULT CALLED TO, READ BACK BY AND VERIFIED WITH: RN RPurvis Kilts [phone] @1603  FH (NOTE) The GeneXpert MRSA Assay (FDA approved for NASAL specimens only), is one component of a comprehensive MRSA colonization surveillance program. It is not intended to diagnose MRSA infection nor to guide or monitor treatment for MRSA infections. Test performance is not FDA approved in patients less than 62 years old. Performed at [facility], [place] [address], [place], [place] [address]   Culture, blood (Routine X 2) w Reflex to ID Panel     Status: None   Collection Time: 10/15/22 11:35 AM   Specimen: BLOOD LEFT HAND  Result Value Ref Range Status   Specimen Description BLOOD LEFT HAND  Final   Special Requests   Final    BOTTLES DRAWN AEROBIC ONLY Blood Culture adequate volume   Culture   Final    NO GROWTH 5 DAYS Performed at [facility], [place] [address], [place], [place] [address]    Report  Status 10/20/2022 FINAL  Final  Culture, blood (Routine X 2) w Reflex  to ID Panel     Status: None   Collection Time: 10/15/22 11:35 AM   Specimen: BLOOD LEFT ARM  Result Value Ref Range Status   Specimen Description BLOOD LEFT ARM  Final   Special Requests IN PEDIATRIC BOTTLE Blood Culture adequate volume  Final   Culture   Final    NO GROWTH 5 DAYS Performed at [facility], [address] [place], [place] [address]    Report Status 10/20/2022 FINAL  Final  Culture, Respiratory w Gram Stain (tracheal aspirate)     Status: None   Collection Time: 10/16/22  9:59 AM   Specimen: Tracheal Aspirate; Respiratory  Result Value Ref Range Status   Specimen Description TRACHEAL ASPIRATE  Final   Special Requests NONE  Final   Gram Stain   Final    FEW WBC PRESENT, PREDOMINANTLY PMN NO ORGANISMS SEEN Performed at [facility], [address], [place], [place] [address]    Culture RARE PROTEUS MIRABILIS  Final   Report Status 10/19/2022 FINAL  Final   Organism ID, Bacteria PROTEUS MIRABILIS  Final      Susceptibility   Proteus mirabilis - MIC*    AMPICILLIN <=2 SENSITIVE Sensitive     CEFAZOLIN <=4 SENSITIVE Sensitive     CEFEPIME <=0.12 SENSITIVE Sensitive     CEFTAZIDIME <=1 SENSITIVE Sensitive     CEFTRIAXONE <=0.25 SENSITIVE Sensitive     CIPROFLOXACIN <=0.25 SENSITIVE Sensitive     GENTAMICIN <=1 SENSITIVE Sensitive     IMIPENEM 8 INTERMEDIATE Intermediate     TRIMETH/SULFA <=20 SENSITIVE Sensitive     AMPICILLIN/SULBACTAM <=2 SENSITIVE Sensitive     PIP/TAZO <=4 SENSITIVE Sensitive     * RARE PROTEUS MIRABILIS      Studies: DG Elbow 2 Views Right  Result Date: 10/21/2022 CLINICAL DATA:  Pain swelling and tenderness RIGHT elbow question IV infiltration EXAM: RIGHT ELBOW - 2 VIEW COMPARISON:  None Available. FINDINGS: Regional soft tissue swelling. Osseous mineralization normal. Joint spaces preserved. No fracture, dislocation, or bone destruction. No joint  effusion. IMPRESSION: Soft tissue swelling without osseous abnormalities. Electronically Signed   By: Lavonia Dana M.D.   On: 10/21/2022 10:01    Scheduled Meds:  amoxicillin-clavulanate  1 tablet Oral Q12H   Chlorhexidine Gluconate Cloth  6 each Topical Q0600   docusate  100 mg Per Tube BID   fluticasone furoate-vilanterol  1 puff Inhalation Daily   folic acid  1 mg Per Tube Daily   heparin  5,000 Units Subcutaneous Q8H   insulin aspart  0-6 Units Subcutaneous TID AC & HS   multivitamin with minerals  1 tablet Per Tube Daily   pantoprazole  40 mg Oral Daily   polyethylene glycol  17 g Per Tube Daily   thiamine  100 mg Per Tube Daily    Continuous Infusions:     LOS: 6 days     Geradine Girt, DO [facility]   If 7PM-7AM, please contact night-coverage [email or web address] Password [facility] 10/21/2022, 10:42 AM

## 2022-10-21 NOTE — TOC Progression Note (Signed)
Transition of Care [facility]) - Progression Note    Patient Details  Name: Ronald [name] MRN: [id number] Date of Birth: 1956/03/05  Transition of Care [facility]) CM/SW Contact  Joanne Chars, LCSW Phone Number: 10/21/2022, 2:14 PM  Clinical Narrative:    CSW informed by MD that after family meeting yesterday, pt now agreeable to SNF.  CSW confirmed with pt in room.  Permission given to send out referral in hub.   Expected Discharge Plan: [place] Barriers to Discharge: Continued Medical Work up  Expected Discharge Plan and Services In-house Referral: Clinical Social Work     Living arrangements for the past 2 months: Single Family Home                                       Social Determinants of Health (SDOH) Interventions SDOH Screenings   Food Insecurity: No Food Insecurity (10/17/2022)  Recent Concern: Food Insecurity - Food Insecurity Present (09/17/2022)  Housing: Medium Risk (10/17/2022)  Transportation Needs: Unmet Transportation Needs (10/17/2022)  Utilities: Not At Risk (10/17/2022)  Tobacco Use: High Risk (10/15/2022)    Readmission Risk Interventions     No data to display

## 2022-10-21 NOTE — NC FL2 (Signed)
[place] LEVEL OF CARE FORM     IDENTIFICATION  Patient Name: Ronald [name] Birthdate: Oct 15, 1955 Sex: male Admission Date (Current Location): 10/15/2022  [place] and [place] Number:  Kathleen Argue [phone] [place] and Address:  [facility] [place]. [facility], [place] [address], [place], [place] [address]      Provider Number: [id number]  Attending Physician Name and Address:  Geradine Girt, DO  Relative Name and Phone Number:  Louis,[name] Sister   [phone]    Current Level of Care: Hospital Recommended Level of Care: [place] Prior Approval Number:    Date Approved/Denied:   PASRR Number: [id number]  Discharge Plan: SNF    Current Diagnoses: Patient Active Problem List   Diagnosis Date Noted   Respiratory failure ([place]) 10/15/2022   Chronic hypercapnic respiratory failure ([place]) 09/19/2022   Protein-calorie malnutrition, severe 09/04/2022   Acute hypercapnic respiratory failure ([place]) 09/02/2022   Hx of BKA, left ([place]) 09/02/2022   Hypotension 09/02/2022   Right bundle branch block 09/02/2022   Acute respiratory failure ([place]) 09/02/2022   Hyponatremia 03/26/2019   Altered mental status 03/26/2019   Acute hypoxemic respiratory failure ([place]) 03/26/2019   Dysphagia 03/26/2019   Acute encephalopathy 03/18/2019   COPD exacerbation ([place]) 08/29/2017   Subdural hematoma ([place]) 06/19/2014   Critical lower limb ischemia (HCC) 12/25/2013   Osteomyelitis ([place]) 11/06/2013   HTN (hypertension), benign 11/06/2013   Alcohol abuse 11/06/2013   Osteomyelitis of left foot ([place]) 11/06/2013   COPD (chronic obstructive pulmonary disease) ([place]) 11/06/2013   Accelerated hypertension 09/25/2013   Hypokalemia 09/25/2013   Abscess of left foot 09/21/2013   Cellulitis of left foot 09/19/2013    Orientation RESPIRATION BLADDER Height & Weight     Self, Time, Situation, Place  Normal Continent Weight: 134 lb 11.2 oz (61.1 kg) Height:  5\' 7"   (170.2 cm)  BEHAVIORAL SYMPTOMS/MOOD NEUROLOGICAL BOWEL NUTRITION STATUS      Continent Diet (see discharge summary)  AMBULATORY STATUS COMMUNICATION OF NEEDS Skin   Total Care Verbally Skin abrasions                       Personal Care Assistance Level of Assistance  Bathing, Feeding, Dressing Bathing Assistance: Limited assistance Feeding assistance: Independent Dressing Assistance: Limited assistance     Functional Limitations Info  Sight, Hearing, Speech Sight Info: Adequate Hearing Info: Adequate Speech Info: Adequate    SPECIAL CARE FACTORS FREQUENCY  PT (By licensed PT), OT (By licensed OT)     PT Frequency: 5x week OT Frequency: 5x week            Contractures Contractures Info: Not present    Additional Factors Info  Code Status, Allergies, Insulin Sliding Scale Code Status Info: full Allergies Info: NKA   Insulin Sliding Scale Info: Novolog: see discharge summary       Current Medications (10/21/2022):  This is the current hospital active medication list Current Facility-Administered Medications  Medication Dose Route Frequency Provider Last Rate Last Admin   albuterol (PROVENTIL) (2.5 MG/3ML) 0.083% nebulizer solution 2.5 mg  2.5 mg Inhalation Q4H PRN Omar Person, NP       amoxicillin-clavulanate (AUGMENTIN) 875-125 MG per tablet 1 tablet  1 tablet Oral Q12H Vann, Jessica U, DO   1 tablet at 10/21/22 1028   Chlorhexidine Gluconate Cloth 2 % PADS 6 each  6 each Topical Q0600 Icard, Bradley L, DO   6 each at 10/21/22 0628   docusate (COLACE)  50 MG/5ML liquid 100 mg  100 mg Per Tube BID Omar Person, NP   100 mg at 10/19/22 2235   feeding supplement (NEPRO CARB STEADY) liquid 237 mL  237 mL Oral Q24H Vann, Jessica U, DO       fluticasone furoate-vilanterol (BREO ELLIPTA) 100-25 MCG/ACT 1 puff  1 puff Inhalation Daily Omar Person, NP   1 puff at 30/09/23 3007   folic acid (FOLVITE) tablet 1 mg  1 mg Per Tube Daily Omar Person, NP   1 mg at 10/21/22 1027   heparin injection 5,000 Units  5,000 Units Subcutaneous Q8H Omar Person, NP   5,000 Units at 10/21/22 0618   HYDROcodone-acetaminophen (NORCO/VICODIN) 5-325 MG per tablet 1 tablet  1 tablet Oral Q6H PRN Eulogio Bear U, DO   1 tablet at 10/21/22 6226   insulin aspart (novoLOG) injection 0-6 Units  0-6 Units Subcutaneous TID AC & HS Omar Person, NP       Derrill Memo ON 10/22/2022] multivitamin with minerals tablet 1 tablet  1 tablet Oral Daily Vann, Jessica U, DO       Oral care mouth rinse  15 mL Mouth Rinse PRN Omar Person, NP       pantoprazole (PROTONIX) EC tablet 40 mg  40 mg Oral Daily Hayden Pedro M, NP   40 mg at 10/21/22 1027   polyethylene glycol (MIRALAX / GLYCOLAX) packet 17 g  17 g Per Tube Daily PRN Omar Person, NP       polyethylene glycol (MIRALAX / GLYCOLAX) packet 17 g  17 g Per Tube Daily Omar Person, NP   17 g at 10/19/22 0935   thiamine (VITAMIN B1) tablet 100 mg  100 mg Per Tube Daily Omar Person, NP   100 mg at 10/21/22 1028     Discharge Medications: Please see discharge summary for a list of discharge medications.  Relevant Imaging Results:  Relevant Lab Results:   Additional Information SS# [id number]  Joanne Chars, LCSW

## 2022-10-21 NOTE — Progress Notes (Signed)
Mobility Specialist Progress Note    10/21/22 1030  Mobility  Activity  (wheelchair mobility)  Level of Assistance Minimal assist, patient does 75% or more  Assistive Device Wheelchair  Distance Ambulated (ft) 50 ft  RLE Weight Bearing NWB  Activity Response Tolerated well  Mobility Referral Yes  $Mobility charge 1 Mobility   Pt received in bed and agreeable. No complaints. Practiced AP transfer to/from [place]. Propelled himself ~50'. Returned to bed with call bell in reach and nursing student and instructor present. Bed alarm on.   Hildred Alamin Mobility Specialist  Please Contact via SecureChat or Rehab Office at [phone]

## 2022-10-21 NOTE — Progress Notes (Signed)
Physical Therapy Treatment Patient Details Name: Ronald [name] MRN: [id number] DOB: 03/31/1956 Today's Date: 10/21/2022   History of Present Illness This is a 67 year old gentleman admitted 2/2 after being found down at home after a recent d/c from hospital where he refused SNF. Pt was hyponatremic hypoxemic hypercarbic was placed on BiPAP in the emergency room. Ultimately failed BiPAP requiring intubation mechanical ventilation 2/2--2/3. 2/6: PTA discussed with DO pt R heel ulcer (POA) and pt NWB as of 2/7. PMH: polysubstance abuse, cocaine, alcohol use, R heel pressure injury.    PT Comments    Pt was received in supine and agreeable to therapy with encouragement. Pt participated in A/P transfer training from bed<>w/c requiring up to mod A due to UE pain and weakness. Despite cues, pt was noncompliant with RLE NWB during transfers. Pt was able to clear bottom from bed, but needed mod A with bed pad to propel backward towards w/c. Pt intermittently assisted with LUE to propel w/c, however did not assist with RUE due to pain. Pt participated in seated LE exercises to increase strength with cues for technique. Pt refused to wear R prevalon boot at end of session despite education and encouragement. Plan to continue A/P transfer training to increase independence as pt is able to maintain NWB precautions. Pt continues to benefit from PT services to progress toward functional mobility goals.     Recommendations for follow up therapy are one component of a multi-disciplinary discharge planning process, led by the attending physician.  Recommendations may be updated based on patient status, additional functional criteria and insurance authorization.  Follow Up Recommendations  Skilled nursing-short term rehab (<3 hours/day) Can patient physically be transported by private vehicle: No   Assistance Recommended at Discharge Frequent or constant Supervision/Assistance  Patient can return home with the  following Assistance with cooking/housework;Direct supervision/assist for financial management;Direct supervision/assist for medications management;Assist for transportation;A little help with bathing/dressing/bathroom;A lot of help with walking and/or transfers;Help with stairs or ramp for entrance   Equipment Recommendations  Wheelchair (measurements PT);Wheelchair cushion (measurements PT);Other (comment)    Recommendations for Other Services       Precautions / Restrictions Precautions Precautions: Fall Precaution Comments: 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) and large R heel wound (prevalon boot) Restrictions Weight Bearing Restrictions: Yes RLE Weight Bearing: Non weight bearing     Mobility  Bed Mobility Overal bed mobility: Needs Assistance Bed Mobility: Rolling, Supine to Sit, Sit to Supine Rolling: Supervision   Supine to sit: Supervision, HOB elevated (to longsitting in bed) Sit to supine: Supervision   General bed mobility comments: Cues for RLE NWB, however pt becomes agitated and does not adhere to WB precautions    Transfers Overall transfer level: Needs assistance Equipment used: None Transfers: Bed to chair/wheelchair/BSC         Anterior-Posterior transfers: Mod assist   General transfer comment: Pt required mod A for posterior scoot due to BUE R>L soreness. Pt able to lift bottom up, but has difficulty propelling backward towards w/c.        Balance Overall balance assessment: Needs assistance Sitting-balance support: Single extremity supported, Feet unsupported Sitting balance-Leahy Scale: Fair         Standing balance comment: N/A- now NWB on RLE                            Cognition Arousal/Alertness: Awake/alert Behavior During Therapy: Agitated Overall Cognitive Status: No family/caregiver present to  determine baseline cognitive functioning                                          Exercises General  Exercises - Lower Extremity Long Arc Quad: AROM, Right,10 reps, Seated Hip Flexion/Marching: AROM, Right, Left, 10 reps, Seated    General Comments General comments (skin integrity, edema, etc.): Pt refused to wear prevalon boot on R foot at end of session.      Pertinent Vitals/Pain Pain Assessment Pain Assessment: 0-10 Pain Score: 8  Pain Location: RUE elbow and R foot Pain Descriptors / Indicators: Grimacing, Guarding, Sore Pain Intervention(s): Limited activity within patient's tolerance, Monitored during session, Repositioned     PT Goals (current goals can now be found in the care plan section) Acute Rehab PT Goals Patient Stated Goal: To go home PT Goal Formulation: With patient Time For Goal Achievement: 10/30/22 Potential to Achieve Goals: Fair Progress towards PT goals: Progressing toward goals    Frequency    Min 3X/week      PT Plan Current plan remains appropriate       AM-PAC PT "6 Clicks" Mobility   Outcome Measure  Help needed turning from your back to your side while in a flat bed without using bedrails?: A Little Help needed moving from lying on your back to sitting on the side of a flat bed without using bedrails?: A Little Help needed moving to and from a bed to a chair (including a wheelchair)?: A Lot Help needed standing up from a chair using your arms (e.g., wheelchair or bedside chair)?: Total Help needed to walk in hospital room?: Total Help needed climbing 3-5 steps with a railing? : Total 6 Click Score: 11    End of Session   Activity Tolerance: Patient tolerated treatment well;Patient limited by pain Patient left: in bed;with call bell/phone within reach;with bed alarm set Nurse Communication: Mobility status;Weight bearing status PT Visit Diagnosis: Other abnormalities of gait and mobility (R26.89);Muscle weakness (generalized) (M62.81);History of falling (Z91.81)     Time: 6440-3474 PT Time Calculation (min) (ACUTE ONLY): 24  min  Charges:  $Therapeutic Exercise: 8-22 mins $Therapeutic Activity: 8-22 mins                     Michelle Nasuti, PTA Acute Rehabilitation Services Secure Chat Preferred  [phone]    Michelle Nasuti 10/21/2022, 2:23 PM

## 2022-10-21 NOTE — Progress Notes (Signed)
Patient refused dressing change/wound care

## 2022-10-21 NOTE — Progress Notes (Signed)
Nutrition Follow-up  DOCUMENTATION CODES:   Severe malnutrition in context of chronic illness  INTERVENTION:  Continue to encourage adequate PO intake Continue diet order per ST Nepro Shake po once daily, each supplement provides 425 kcal and 19 grams protein MVI with minerals daily  NUTRITION DIAGNOSIS:   Severe Malnutrition related to chronic illness (COPD, polysubstance abuse) as evidenced by severe fat depletion, severe muscle depletion. - remains applicable  GOAL:  Patient will meet greater than or equal to 90% of their needs - progressing, no longer on TF, addressing via meals and supplements  MONITOR:  Vent status, Labs, Weight trends, TF tolerance, Skin  REASON FOR ASSESSMENT:  Ventilator, Consult Enteral/tube feeding initiation and management  ASSESSMENT:  67 year old male who presented to the ED on 2/02 with SOB, chest pain, and AMS. PMH of COPD, ETOH abuse, cocaine abuse, noncompliance, DM, PVD s/p L BKA. Pt required intubation. Pt admitted with acute respiratory failure, hyponatremia.  2/3: extubated 2/4: s/p BSE- recommend dysphagia 3, nectar thick liquids   Pending SNF placement.   Noted pt declining to follow thickened liquid and dysphagia 3 diet.  Spoke with pt at bedside. He endorses having a good appetite and eating well. He was requesting snacks at time of visit, explained that RD could not provide crackers as this is non compliant with his current diet order. Provided Nepro shake for pt to try. He enjoys these supplements and is agreeable to receiving these between meals to help with his hunger.   Meal completions: 2/5: 75% lunch  2/6: 90% breakfast 2/7: 75% breakfast, 100% lunch 2/8: 100% breakfast  Weight has remained stable throughout admission. Admit weight: 59 kg Current weight: 61.1 kg  Medications: colace, folvite, SSI 0-6 units QID, MVI, protonix, miralax, thiamine  Labs (2/7): sodium 131, BUN 6, Cr 0.48  CBG's 112, 128, 128, 118 x24  hours  Diet Order:   Diet Order             DIET DYS 3 Fluid consistency: Nectar Thick  Diet effective now                   EDUCATION NEEDS:   Not appropriate for education at this time  Skin:  Skin Assessment: Skin Integrity Issues: Skin Integrity Issues:: Stage II Stage II: L elbow, L ischial tuberosity  Last BM:  2/8 (type 4)  Height:   Ht Readings from Last 1 Encounters:  10/15/22 5\' 7"  (1.702 m)    Weight:   Wt Readings from Last 1 Encounters:  10/20/22 61.1 kg   BMI:  Body mass index is 21.1 kg/m.  Estimated Nutritional Needs:   Kcal:  1850-2050  Protein:  85-105 grams  Fluid:  1.8-2.0 L  Clayborne Dana, RDN, LDN Clinical Nutrition

## 2022-10-22 DIAGNOSIS — J1569 Pneumonia due to other gram-negative bacteria: Secondary | ICD-10-CM | POA: Diagnosis not present

## 2022-10-22 DIAGNOSIS — E871 Hypo-osmolality and hyponatremia: Secondary | ICD-10-CM | POA: Diagnosis not present

## 2022-10-22 DIAGNOSIS — J9601 Acute respiratory failure with hypoxia: Secondary | ICD-10-CM | POA: Diagnosis not present

## 2022-10-22 DIAGNOSIS — F1010 Alcohol abuse, uncomplicated: Secondary | ICD-10-CM | POA: Diagnosis not present

## 2022-10-22 LAB — CBC
HCT: 29.2 % — ABNORMAL LOW (ref 39.0–52.0)
Hemoglobin: 9.8 g/dL — ABNORMAL LOW (ref 13.0–17.0)
MCH: 31.8 pg (ref 26.0–34.0)
MCHC: 33.6 g/dL (ref 30.0–36.0)
MCV: 94.8 fL (ref 80.0–100.0)
Platelets: 417 10*3/uL — ABNORMAL HIGH (ref 150–400)
RBC: 3.08 MIL/uL — ABNORMAL LOW (ref 4.22–5.81)
RDW: 17.4 % — ABNORMAL HIGH (ref 11.5–15.5)
WBC: 13.8 10*3/uL — ABNORMAL HIGH (ref 4.0–10.5)
nRBC: 0 % (ref 0.0–0.2)

## 2022-10-22 LAB — GLUCOSE, CAPILLARY
Glucose-Capillary: 91 mg/dL (ref 70–99)
Glucose-Capillary: 105 mg/dL — ABNORMAL HIGH (ref 70–99)
Glucose-Capillary: 112 mg/dL — ABNORMAL HIGH (ref 70–99)
Glucose-Capillary: 136 mg/dL — ABNORMAL HIGH (ref 70–99)

## 2022-10-22 LAB — BASIC METABOLIC PANEL
Anion gap: 9 (ref 5–15)
BUN: 9 mg/dL (ref 8–23)
CO2: 29 mmol/L (ref 22–32)
Calcium: 8.8 mg/dL — ABNORMAL LOW (ref 8.9–10.3)
Chloride: 90 mmol/L — ABNORMAL LOW (ref 98–111)
Creatinine, Ser: 0.48 mg/dL — ABNORMAL LOW (ref 0.61–1.24)
GFR, Estimated: >60 mL/min (ref >60)
Glucose, Bld: 107 mg/dL — ABNORMAL HIGH (ref 70–99)
Potassium: 4 mmol/L (ref 3.5–5.1)
Sodium: 128 mmol/L — ABNORMAL LOW (ref 135–145)

## 2022-10-22 NOTE — Progress Notes (Signed)
PROGRESS NOTE  [name] DOB: [birth year] DOA: 10/15/2022 PCP: Demetrios Isaacs, FNP  HPI/Recap of past 75 hours: 67 year old male who is known polysubstance abuse, EtOH abuse cocaine abuse, noncompliance, unstageable pressure injury to right heel, peripheral vascular disease, who presents from home via EMS with altered mental status along with noncompliance. Recently admitted and discharged with concern that he needed to go to a skilled nursing facility for which he refused. He now presents with a sodium of 125, altered mental status, hypoxic and hypercarbic, and noted to be aspirating while on noninvasive mechanical ventilatory support requiring intubation.  Admitted to ICU.  EEG was negative for seizures.  Was started on IV antibiotics due to concern for aspiration.  Extubated on 10/16/2022.  Transferred to [facility] service on 10/18/2022. Currently agreeable to SNF   Assessment/Plan: Principal Problem:   Respiratory failure ([place]) Active Problems:   Alcohol abuse  Acute hypercarbic/hypoxic respiratory failure status postextubation 10/16/2022.   History of COPD and chronic alcohol abuse  Currently on Unasyn due to concern for aspiration.   - switch to Augmentin on 10/20/2022 to complete 7 days of antibiotics.   Continue DuoNebs  Continue incentive spirometer, pulmonary hygiene.  -dys diet)- reconsult SLP for eval  Proteus mirabilis pneumonia, POA Proteus mirabilis grew from tracheal aspirate Sensitive to Unasyn, continue   Acute on Chronic Hyponatremia in the setting of beer potomania  On admission serum sodium 112 Currently serum sodium 130, stable. Encourage oral intake   Dysphagia  Seen by speech therapist on dysphagia 3 diet, nectar thick liquid, home meds with pure, per speech therapist Continue aspiration precautions -patient not compliant   Resolved post repletion: Hypomagnesia.  Resolved post repletion: Hypokalemia.   Prediabetes, blood sugars are  well-controlled A1c 5.6 on 09/02/2022.   Chronic EtOH abuse with concern for withdrawal Cocaine Abuse  UDS positive for cocaine on 09/20/2022. Continue folic acid supplement, thiamine supplement, multivitamins, out of window for alcohol withdrawal.   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 -unfortunately patient is not interested in following the advice of keeping heel off bed/NWB  Right elbow swelling -no fracture -?cellulitis vs thrombophlebitis -on abx -monitor -apply heat   Agreeable to SNF   Time 50 minutes.     Labs   CBC: Last Labs          Recent Labs  Lab 10/15/22 0636 10/15/22 [id number] 10/15/22 0903 10/15/22 1148 10/16/22 0718 10/17/22 0602  WBC 15.8*  --   --   --  14.7* 13.8*  HGB 11.2* 12.6* 11.2* 11.9* 8.8* 9.0*  HCT 31.1* 37.0* 33.0* 35.0* 25.7* 26.7*  MCV 88.9  --   --   --  91.8 94.7  PLT 423*  --   --   --  418* 384        Code Status: Full code  Family Communication: called sister  Disposition Plan:  agreeable to SNF   Consultants: PCCM.     Subjective: Right elbow pain better  Objective: Vitals:   10/21/22 1308 10/21/22 2030 10/22/22 0740 10/22/22 0900  BP: 133/77 138/82 139/82   Pulse: 95 100 87   Resp: 18 15 18   $ Temp: 98.4 F (36.9 C) 99 F (37.2 C) 99.5 F (37.5 C)   TempSrc: Oral Oral Oral   SpO2: 96% 99% 98% 98%  Weight:      Height:  Intake/Output Summary (Last 24 hours) at 10/22/2022 1116 Last data filed at 10/22/2022 0740 Gross per 24 hour  Intake 480 ml  Output 2350 ml  Net -1870 ml   Filed Weights   10/17/22 0500 10/19/22 0500 10/20/22 0500  Weight: 60.9 kg 61 kg 61.1 kg    Exam:    General: Appearance:    Well developed, well nourished male in no acute distress     Lungs:     respirations unlabored  Heart:    Normal  heart rate. Normal rhythm. No murmurs, rubs, or gallops.   MS:   Below knee amputation of left lower extremity is noted.  Neurologic:   Awake, alert- more cooperative today        Data Reviewed: CBC: Recent Labs  Lab 10/17/22 0602 10/18/22 0306 10/19/22 0357 10/20/22 0428 10/22/22 0319  WBC 13.8* 14.2* 15.6* 13.2* 13.8*  HGB 9.0* 9.4* 10.8* 10.7* 9.8*  HCT 26.7* 27.6* 31.2* 32.5* 29.2*  MCV 94.7 93.9 93.7 96.4 94.8  PLT 384 402* 398 395 [id number]*   Basic Metabolic Panel: Recent Labs  Lab 10/15/22 1627 10/15/22 2236 10/16/22 0718 10/16/22 1332 10/17/22 0602 10/18/22 0306 10/19/22 0357 10/20/22 0428 10/22/22 0319  NA 122*   < > 125*   < > 131* 130* 130* 131* 128*  K 3.9   < > 3.9   < > 3.6 3.4* 3.6 3.9 4.0  CL 80*   < > 88*   < > 90* 90* 91* 93* 90*  CO2 25   < > 30   < > 32 31 32 28 29  GLUCOSE 111*   < > 132*   < > 94 100* 98 104* 107*  BUN 5*   < > 7*   < > 6* <5* <5* 6* 9  CREATININE 0.52*   < > 0.58*   < > 0.45* 0.52* 0.50* 0.48* 0.48*  CALCIUM 8.7*   < > 8.9   < > 8.3* 8.4* 9.0 8.9 8.8*  MG 1.4*  --  1.5*  --  1.7 1.9 1.7  --   --   PHOS 3.5  --  2.4*  --  4.2 3.7 3.4  --   --    < > = values in this interval not displayed.   GFR: Estimated Creatinine Clearance: 79.6 mL/min (A) (by C-G formula based on SCr of 0.48 mg/dL (L)). Liver Function Tests: Recent Labs  Lab 10/15/22 1135  AST 21  ALT 10  ALKPHOS 91  BILITOT 1.4*  PROT 7.1  ALBUMIN 3.8   No results for input(s): "LIPASE", "AMYLASE" in the last 168 hours. No results for input(s): "AMMONIA" in the last 168 hours. Coagulation Profile: No results for input(s): "INR", "PROTIME" in the last 168 hours. Cardiac Enzymes: No results for input(s): "CKTOTAL", "CKMB", "CKMBINDEX", "TROPONINI" in the last 168 hours. BNP (last 3 results) No results for input(s): "PROBNP" in the last 8760 hours. HbA1C: No results for input(s): "HGBA1C" in the last 72 hours. CBG: Recent Labs  Lab 10/21/22 1128  10/21/22 1617 10/21/22 2029 10/22/22 0754 10/22/22 1113  GLUCAP 112* 117* 143* 91 105*   Lipid Profile: No results for input(s): "CHOL", "HDL", "LDLCALC", "TRIG", "CHOLHDL", "LDLDIRECT" in the last 72 hours.  Thyroid Function Tests: No results for input(s): "TSH", "T4TOTAL", "FREET4", "T3FREE", "THYROIDAB" in the last 72 hours. Anemia Panel: No results for input(s): "VITAMINB12", "FOLATE", "FERRITIN", "TIBC", "IRON", "RETICCTPCT" in the last 72 hours. Urine analysis:    Component Value Date/Time  COLORURINE STRAW (A) 09/20/2022 0229   APPEARANCEUR CLEAR 09/20/2022 0229   LABSPEC 1.003 (L) 09/20/2022 0229   PHURINE 7.0 09/20/2022 0229   GLUCOSEU NEGATIVE 09/20/2022 0229   HGBUR NEGATIVE 09/20/2022 0229   BILIRUBINUR NEGATIVE 09/20/2022 0229   KETONESUR NEGATIVE 09/20/2022 0229   PROTEINUR NEGATIVE 09/20/2022 0229   NITRITE NEGATIVE 09/20/2022 0229   LEUKOCYTESUR NEGATIVE 09/20/2022 0229    Recent Results (from the past 240 hour(s))  Resp panel by RT-PCR (RSV, Flu A&B, Covid) Anterior Nasal Swab     Status: None   Collection Time: 10/15/22  6:36 AM   Specimen: Anterior Nasal Swab  Result Value Ref Range Status   SARS Coronavirus 2 by RT PCR NEGATIVE NEGATIVE Final   Influenza A by PCR NEGATIVE NEGATIVE Final   Influenza B by PCR NEGATIVE NEGATIVE Final    Comment: (NOTE) The Xpert Xpress SARS-CoV-2/FLU/RSV plus assay is intended as an aid in the diagnosis of influenza from Nasopharyngeal swab specimens and should not be used as a sole basis for treatment. Nasal washings and aspirates are unacceptable for Xpert Xpress SARS-CoV-2/FLU/RSV testing.  Fact Sheet for Patients: EntrepreneurPulse.com.au  Fact Sheet for Healthcare Providers: IncredibleEmployment.be  This test is not yet approved or cleared by the [place] FDA and has been authorized for detection and/or diagnosis of SARS-CoV-2 by FDA under an Emergency Use  Authorization (EUA). This EUA will remain in effect (meaning this test can be used) for the duration of the COVID-19 declaration under Section 564(b)(1) of the Act, 21 U.S.C. section 360bbb-3(b)(1), unless the authorization is terminated or revoked.     Resp Syncytial Virus by PCR NEGATIVE NEGATIVE Final    Comment: (NOTE) Fact Sheet for Patients: EntrepreneurPulse.com.au  Fact Sheet for Healthcare Providers: IncredibleEmployment.be  This test is not yet approved or cleared by the [place] FDA and has been authorized for detection and/or diagnosis of SARS-CoV-2 by FDA under an Emergency Use Authorization (EUA). This EUA will remain in effect (meaning this test can be used) for the duration of the COVID-19 declaration under Section 564(b)(1) of the Act, 21 U.S.C. section 360bbb-3(b)(1), unless the authorization is terminated or revoked.  Performed at [facility], [place] [address] [place], [place] [address]   MRSA Next Gen by PCR, Nasal     Status: Abnormal   Collection Time: 10/15/22 11:09 AM  Result Value Ref Range Status   MRSA by PCR Next Gen DETECTED (A) NOT DETECTED Final    Comment: RESULT CALLED TO, READ BACK BY AND VERIFIED WITH: RN RPurvis Kilts [phone] @1603$  FH (NOTE) The GeneXpert MRSA Assay (FDA approved for NASAL specimens only), is one component of a comprehensive MRSA colonization surveillance program. It is not intended to diagnose MRSA infection nor to guide or monitor treatment for MRSA infections. Test performance is not FDA approved in patients less than 40 years old. Performed at [facility], [place] [address] [place], [place] [address]   Culture, blood (Routine X 2) w Reflex to ID Panel     Status: None   Collection Time: 10/15/22 11:35 AM   Specimen: BLOOD LEFT HAND  Result Value Ref Range Status   Specimen Description BLOOD LEFT HAND  Final   Special Requests   Final    BOTTLES DRAWN AEROBIC ONLY Blood  Culture adequate volume   Culture   Final    NO GROWTH 5 DAYS Performed at [facility], [place] [address], [place], [place] [address]    Report  Status 10/20/2022 FINAL  Final  Culture, blood (Routine X 2) w Reflex to ID Panel     Status: None   Collection Time: 10/15/22 11:35 AM   Specimen: BLOOD LEFT ARM  Result Value Ref Range Status   Specimen Description BLOOD LEFT ARM  Final   Special Requests IN PEDIATRIC BOTTLE Blood Culture adequate volume  Final   Culture   Final    NO GROWTH 5 DAYS Performed at [facility], [place] [address] [place], [place] [address]    Report Status 10/20/2022 FINAL  Final  Culture, Respiratory w Gram Stain (tracheal aspirate)     Status: None   Collection Time: 10/16/22  9:59 AM   Specimen: Tracheal Aspirate; Respiratory  Result Value Ref Range Status   Specimen Description TRACHEAL ASPIRATE  Final   Special Requests NONE  Final   Gram Stain   Final    FEW WBC PRESENT, PREDOMINANTLY PMN NO ORGANISMS SEEN Performed at [facility], [address] [place],  [address]    Culture RARE PROTEUS MIRABILIS  Final   Report Status 10/19/2022 FINAL  Final   Organism ID, Bacteria PROTEUS MIRABILIS  Final      Susceptibility   Proteus mirabilis - MIC*    AMPICILLIN <=2 SENSITIVE Sensitive     CEFAZOLIN <=4 SENSITIVE Sensitive     CEFEPIME <=0.12 SENSITIVE Sensitive     CEFTAZIDIME <=1 SENSITIVE Sensitive     CEFTRIAXONE <=0.25 SENSITIVE Sensitive     CIPROFLOXACIN <=0.25 SENSITIVE Sensitive     GENTAMICIN <=1 SENSITIVE Sensitive     IMIPENEM 8 INTERMEDIATE Intermediate     TRIMETH/SULFA <=20 SENSITIVE Sensitive     AMPICILLIN/SULBACTAM <=2 SENSITIVE Sensitive     PIP/TAZO <=4 SENSITIVE Sensitive     * RARE PROTEUS MIRABILIS      Studies: No results found.  Scheduled Meds:  amoxicillin-clavulanate  1 tablet Oral Q12H   Chlorhexidine Gluconate Cloth  6 each Topical Q0600   docusate  100 mg Per Tube BID   feeding supplement  (NEPRO CARB STEADY)  237 mL Oral Q24H   fluticasone furoate-vilanterol  1 puff Inhalation Daily   folic acid  1 mg Per Tube Daily   heparin  5,000 Units Subcutaneous Q8H   insulin aspart  0-6 Units Subcutaneous TID AC & HS   multivitamin with minerals  1 tablet Oral Daily   pantoprazole  40 mg Oral Daily   polyethylene glycol  17 g Per Tube Daily   thiamine  100 mg Per Tube Daily    Continuous Infusions:     LOS: 7 days     Geradine Girt, DO [facility]   If 7PM-7AM, please contact night-coverage [email or web address] Password [facility] 10/22/2022, 11:16 AM

## 2022-10-22 NOTE — Progress Notes (Signed)
Mobility Specialist Progress Note    10/22/22 1522  Mobility  Activity  (wheelchair transfer)  Level of Assistance Moderate assist, patient does 50-74%  Assistive Device Wheelchair  RLE Weight Bearing NWB  Activity Response Tolerated fair  Mobility Referral Yes  $Mobility charge 1 Mobility   Pt received in bed and agreeable to practice AP transfer. Pt requiring cues for hand placement and reminder of NWB status. Transferred to/from [place]. Left in bed with call bell in reach and bed alarm on. RN aware.   Hildred Alamin Mobility Specialist  Please Psychologist, sport and exercise or Rehab Office at [phone]

## 2022-10-22 NOTE — TOC Progression Note (Signed)
Transition of Care [facility]) - Progression Note    Patient Details  Name: [name] MRN: [id number] Date of Birth: 1955/12/12  [facility] CM/SW Contact  Joanne Chars, LCSW Phone Number: 10/22/2022, 8:41 AM  Clinical Narrative:   No current bed offers.      Expected Discharge Plan: [place] Barriers to Discharge: Continued Medical Work up  Expected Discharge Plan and Services In-house Referral: Clinical Social Work     Living arrangements for the past 2 months: Single Family Home                                       Social Determinants of Health (SDOH) Interventions SDOH Screenings   Food Insecurity: No Food Insecurity (10/17/2022)  Recent Concern: Food Insecurity - Food Insecurity Present (09/17/2022)  Housing: Medium Risk (10/17/2022)  Transportation Needs: Unmet Transportation Needs (10/17/2022)  Utilities: Not At Risk (10/17/2022)  Tobacco Use: High Risk (10/15/2022)    Readmission Risk Interventions     No data to display

## 2022-10-22 NOTE — Progress Notes (Signed)
Occupational Therapy Treatment Patient Details Name: Ronald [name] MRN: [id number] DOB: 1955/11/12 Today's Date: 10/22/2022   History of present illness This is a 67 year old gentleman admitted 2/2 after being found down at home after a recent d/c from hospital where he refused SNF. Pt was hyponatremic hypoxemic hypercarbic was placed on BiPAP in the emergency room. Ultimately failed BiPAP requiring intubation mechanical ventilation 2/2--2/3. 2/6: PTA discussed with DO pt R heel ulcer (POA) and pt NWB as of 2/7. PMH: polysubstance abuse, cocaine, alcohol use, R heel pressure injury.   OT comments  RN wrapping foot wound on arrival, and agitated regarding current diet (DIII). Pt deferring OOB transfer this date, but agreeable to UE exercises. Pt long sitting in bed performing BUE shoulder flexion, elbow flexion, composite flexion/extension of digits, scapular elevation/depression, seated rows, and chair push ups to tolerance as pt with pain in RUE at elbow. Pt washing face long sitting in bed with supervision. Pt with continued agitation with wanting to eat throughout session, and limited participation in additional UE exercises due to pain. Continue to recommend SNF for continued OT services.    Recommendations for follow up therapy are one component of a multi-disciplinary discharge planning process, led by the attending physician.  Recommendations may be updated based on patient status, additional functional criteria and insurance authorization.    Follow Up Recommendations  Skilled nursing-short term rehab (<3 hours/day)     Assistance Recommended at Discharge Frequent or constant Supervision/Assistance  Patient can return home with the following  A little help with walking and/or transfers;A little help with bathing/dressing/bathroom;Assistance with cooking/housework;Direct supervision/assist for financial management;Direct supervision/assist for medications management;Help with stairs or ramp  for entrance   Equipment Recommendations  None recommended by OT    Recommendations for Other Services      Precautions / Restrictions Precautions Precautions: Fall Precaution Comments: 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) and large R heel wound (prevalon boot) Restrictions Weight Bearing Restrictions: Yes RLE Weight Bearing: Non weight bearing Other Position/Activity Restrictions: NWB orders now in chart; no [employer] shoe orders, but evaluating PT and PTA have asked for one       Mobility Bed Mobility Overal bed mobility: Needs Assistance Bed Mobility: Supine to Sit, Sit to Supine     Supine to sit: Supervision, HOB elevated Sit to supine: Supervision   General bed mobility comments: Cues for RLE NWB, however pt becomes agitated and decr adherence to WB precautions    Transfers Overall transfer level: Needs assistance Equipment used: None           Anterior-Posterior transfers:  (Pt scooting forward and backward in bed minimal distance, refusing assist due to reporting he can do it independently; however, overall would be ineffective to scoot to chair and would require assist)         Balance Overall balance assessment: Needs assistance Sitting-balance support: Single extremity supported, Feet unsupported Sitting balance-Leahy Scale: Fair                                     ADL either performed or assessed with clinical judgement   ADL Overall ADL's : Needs assistance/impaired     Grooming: Set up;Sitting Grooming Details (indicate cue type and reason): washed face long sitting in bed                   Toilet Transfer Details (indicate cue type and reason):  refused transfer           General ADL Comments: Pt agitated on arrival wanting peanut butter and crackers, but per RN, he cannot have due to his DIII diet orders. Pt agreeable to UE exercises during session    Extremity/Trunk Assessment Upper Extremity Assessment Upper  Extremity Assessment: Generalized weakness   Lower Extremity Assessment Lower Extremity Assessment: Defer to PT evaluation        Vision   Vision Assessment?: No apparent visual deficits   Perception     Praxis      Cognition Arousal/Alertness: Awake/alert Behavior During Therapy: Agitated Overall Cognitive Status: No family/caregiver present to determine baseline cognitive functioning Area of Impairment: Following commands, Safety/judgement, Problem solving, Awareness, Memory                     Memory: Decreased short-term memory Following Commands: Follows multi-step commands inconsistently, Follows one step commands with increased time Safety/Judgement: Decreased awareness of safety Awareness: Intellectual, Emergent Problem Solving: Difficulty sequencing, Requires verbal cues General Comments: Pt A&O, but decresed insight into current level of function reporting that he can move independently. Pt receptive to instruction/demo on safe transfer technique but defers to attempt at time of session. ABle to count backward from 10 down to 1 and provide basic description of how he typically manages his medications in the home setting.        Exercises Exercises: Amputee, Other exercises Amputee Exercises Quad Sets: AROM, Both, 10 reps, Supine Hip ABduction/ADduction: AROM, AAROM, Both, 10 reps, Supine Other Exercises Other Exercises: BUE shoudler flexionx10; elbow flexionx10; performing 3 seated rows (shoulder protraction), but refusing to perform more due to R elbow pain; 2 chair push ups, but refusing further due to pain.    Shoulder Instructions       General Comments refusing to wear prevalon boot reporting that it hurts    Pertinent Vitals/ Pain       Pain Assessment Pain Assessment: Faces Faces Pain Scale: Hurts whole lot Pain Location: RUE elbow and R foot Pain Descriptors / Indicators: Grimacing, Guarding, Sore Pain Intervention(s): Limited activity  within patient's tolerance, Monitored during session  Home Living                                          Prior Functioning/Environment              Frequency  Min 2X/week        Progress Toward Goals  OT Goals(current goals can now be found in the care plan section)  Progress towards OT goals: Progressing toward goals  Acute Rehab OT Goals Patient Stated Goal: no more DIII diet OT Goal Formulation: With patient Time For Goal Achievement: 11/03/22 Potential to Achieve Goals: Good ADL Goals Pt Will Perform Lower Body Dressing: with supervision;sitting/lateral leans Pt Will Transfer to Toilet: with min guard assist;anterior/posterior transfer Pt Will Perform Toileting - Clothing Manipulation and hygiene: with supervision;sitting/lateral leans Pt/caregiver will Perform Home Exercise Program: Increased strength;Right Upper extremity;Left upper extremity;Both right and left upper extremity;With written HEP provided Additional ADL Goal #1: Pt will transfer to wheelchair while maintaining WB precautions with min guard A.  Plan Discharge plan remains appropriate    Co-evaluation                 AM-PAC OT "6 Clicks" Daily Activity     Outcome Measure  Help from another person eating meals?: None Help from another person taking care of personal grooming?: A Little Help from another person toileting, which includes using toliet, bedpan, or urinal?: A Little Help from another person bathing (including washing, rinsing, drying)?: A Little Help from another person to put on and taking off regular upper body clothing?: A Little Help from another person to put on and taking off regular lower body clothing?: A Little 6 Click Score: 19    End of Session    OT Visit Diagnosis: Unsteadiness on feet (R26.81);Other abnormalities of gait and mobility (R26.89);Muscle weakness (generalized) (M62.81)   Activity Tolerance Patient tolerated treatment well    Patient Left in bed;with call bell/phone within reach;with bed alarm set   Nurse Communication Mobility status        Time: [id number] OT Time Calculation (min): 16 min  Charges: OT General Charges $OT Visit: 1 Visit OT Treatments $Therapeutic Exercise: 8-22 mins  Elder Cyphers, OTR/L [facility] Acute Rehabilitation Office: [phone]   Magnus Ivan 10/22/2022, 11:16 AM

## 2022-10-22 NOTE — Evaluation (Signed)
Clinical/Bedside Swallow Evaluation Patient Details  Name: Ronald [name] MRN: [id number] Date of Birth: 23-Jun-1956  Today's Date: 10/22/2022 Time: SLP Start Time (ACUTE ONLY): 1201 SLP Stop Time (ACUTE ONLY): 1215 SLP Time Calculation (min) (ACUTE ONLY): 14 min  Past Medical History:  Past Medical History:  Diagnosis Date   Critical lower limb ischemia ([place])    ETOH abuse    Gout    Hypertension    Past Surgical History:  Past Surgical History:  Procedure Laterality Date   ABDOMINAL AORTAGRAM N/A 12/26/2013   Procedure: ABDOMINAL Maxcine Ham;  Surgeon: Serafina Mitchell, MD;  Location: [facility] CATH LAB;  Service: Cardiovascular;  Laterality: N/A;   AMPUTATION Left 12/28/2013   Procedure: LEFT BELOW KNEE AMPUTATION;  Surgeon: Newt Minion, MD;  Location: [place];  Service: Orthopedics;  Laterality: Left;   BELOW KNEE LEG AMPUTATION     Left   LEG SURGERY     HPI:  This is a 67 year old gentleman admitted 2/2 after being found down at home after a recent d/c from hospital where he refused SNF.  Pt was hyponatremic hypoxemic hypercarbic was placed on BiPAP in the emergency room.  Ultimately failed BiPAP requiring intubation mechanical ventilation 2/2--2/3.  Passed [facility] swallow screen after extubation, then demonstrated consistent coughing with PO intake and was made NPO.  Pt consistently denies difficulty swallowing.  PMH: polysubstance abuse, cocaine, alcohol use. Pt had MBS 09/22/22 which was essentially normal.    Assessment / Plan / Recommendation  Clinical Impression  Pt would like to advance liquid consistency to thin liquids.  SLP reconsulted for swallowing assessment.  Pt coughing with midday meal tray on SLP arrival.  Pt denies that he is coughing. Pt tolerated serial cup sips of nectar thick liquid without overt s/s of aspiration.  With thin liquid by serial cup and serial straw sip there was wet vocal quality on 3 of 4 trials. Pt becomes agitated with mention of possible swallowing  impairment.  He refused MBS in room with SLP.  He is adamant that he is safe to resume thin liquids.  Liquid consistency may be advanced at MD discretion without instrumental, but clinically, pt does not appear safe with thin consistencies, or with current diet texture.    SLP returned to room as coughing persisted with meal tray. Pt continues to deny that he is coughing.  He states "I'm fine. I'm fine."  When asked again about MBS pt relpies "Yeah, yeah, whatever."  SLP will plan for MBS as radiology schedule permits.    Recommend continuing mechanical soft solids with nectar thick liquids.  SLP Visit Diagnosis: Dysphagia, unspecified (R13.10)    Aspiration Risk  Mild aspiration risk    Diet Recommendation Dysphagia 3 (Mech soft);Nectar-thick liquid   Liquid Administration via: Cup Medication Administration: Whole meds with puree Supervision: Intermittent supervision to cue for compensatory strategies Compensations: Minimize environmental distractions;Slow rate;Small sips/bites Postural Changes: Seated upright at 90 degrees    Other  Recommendations Oral Care Recommendations: Oral care BID    Recommendations for follow up therapy are one component of a multi-disciplinary discharge planning process, led by the attending physician.  Recommendations may be updated based on patient status, additional functional criteria and insurance authorization.  Follow up Recommendations  (TBD)      Assistance Recommended at Discharge  N/A  Functional Status Assessment Patient has had a recent decline in their functional status and demonstrates the ability to make significant improvements in function in a reasonable and predictable amount of  time.  Frequency and Duration  (pending results of MBS)          Prognosis Prognosis for improved oropharyngeal function:  (pending results of MBS)      Swallow Study   General Date of Onset: 10/15/22 HPI: This is a 67 year old gentleman admitted 2/2  after being found down at home after a recent d/c from hospital where he refused SNF.  Pt was hyponatremic hypoxemic hypercarbic was placed on BiPAP in the emergency room.  Ultimately failed BiPAP requiring intubation mechanical ventilation 2/2--2/3.  Passed [facility] swallow screen after extubation, then demonstrated consistent coughing with PO intake and was made NPO.  Pt consistently denies difficulty swallowing.  PMH: polysubstance abuse, cocaine, alcohol use. Pt had MBS 09/22/22 which was essentially normal. Type of Study: Bedside Swallow Evaluation Previous Swallow Assessment: CSE 2/4 this admission.  09/22/22 MBS - onset of swallow at pyriforms with thin liquids; no aspiration; recommended regular solids/thin liquids Diet Prior to this Study: Moderately thick liquids (Level 3, honey thick);Mildly thick liquids (Level 2, nectar thick) Temperature Spikes Noted: No Respiratory Status: Nasal cannula History of Recent Intubation: Yes Total duration of intubation (days): 1 days Date extubated: 10/16/22 Behavior/Cognition: Impulsive;Agitated;Uncooperative Oral Care Completed by SLP: No Oral Cavity - Dentition: Missing dentition Vision: Functional for self-feeding Self-Feeding Abilities: Able to feed self Patient Positioning: Partially reclined Baseline Vocal Quality: Normal    Oral/Motor/Sensory Function Overall Oral Motor/Sensory Function:  (not tested)   Ice Chips Ice chips: Not tested   Thin Liquid Thin Liquid: Impaired Pharyngeal  Phase Impairments: Wet Vocal Quality    Nectar Thick Nectar Thick Liquid: Within functional limits Presentation: Cup   Honey Thick Honey Thick Liquid: Not tested   Puree Puree: Within functional limits Presentation: Self Fed   Solid     Solid: Impaired Presentation: Self Fed Pharyngeal Phase Impairments: Cough - Delayed      Ronald Savage, MA, [place] Office: [phone] 10/22/2022,12:46 PM

## 2022-10-23 ENCOUNTER — Inpatient Hospital Stay (HOSPITAL_COMMUNITY): Payer: Medicare Other

## 2022-10-23 DIAGNOSIS — E871 Hypo-osmolality and hyponatremia: Secondary | ICD-10-CM | POA: Diagnosis not present

## 2022-10-23 DIAGNOSIS — J9601 Acute respiratory failure with hypoxia: Secondary | ICD-10-CM | POA: Diagnosis not present

## 2022-10-23 DIAGNOSIS — F1010 Alcohol abuse, uncomplicated: Secondary | ICD-10-CM | POA: Diagnosis not present

## 2022-10-23 DIAGNOSIS — J441 Chronic obstructive pulmonary disease with (acute) exacerbation: Secondary | ICD-10-CM | POA: Diagnosis not present

## 2022-10-23 DIAGNOSIS — J1569 Pneumonia due to other gram-negative bacteria: Secondary | ICD-10-CM | POA: Diagnosis not present

## 2022-10-23 LAB — GLUCOSE, CAPILLARY
Glucose-Capillary: 114 mg/dL — ABNORMAL HIGH (ref 70–99)
Glucose-Capillary: 103 mg/dL — ABNORMAL HIGH (ref 70–99)
Glucose-Capillary: 120 mg/dL — ABNORMAL HIGH (ref 70–99)
Glucose-Capillary: 110 mg/dL — ABNORMAL HIGH (ref 70–99)

## 2022-10-23 NOTE — Progress Notes (Signed)
   10/23/22 1100  Mobility  Activity Refused mobility   Mobility Specialist Progress Note  Pt refused mobility d/t no reason. Came off aggressive.Will f/u as able.    Lucious Groves Mobility Specialist  Please contact via SecureChat or Rehab office at [phone]

## 2022-10-23 NOTE — Progress Notes (Signed)
Wound care completed as ordered, pt refused Prevalon boot, stated that it makes his foot hurt worse, pt educated on why this should be used but still declines to wear while in bed. Pt also refused oral care, educated on this as well.

## 2022-10-23 NOTE — Progress Notes (Signed)
PROGRESS NOTE  [name] [id number] DOB: 1956/08/25 DOA: 10/15/2022 PCP: Demetrios Isaacs, FNP  HPI/Recap of past 47 hours: 67 year old male who is known polysubstance abuse, EtOH abuse cocaine abuse, noncompliance, unstageable pressure injury to right heel, peripheral vascular disease, who presents from home via EMS with altered mental status along with noncompliance. Recently admitted and discharged with concern that he needed to go to a skilled nursing facility for which he refused. He now presents with a sodium of 125, altered mental status, hypoxic and hypercarbic, and noted to be aspirating while on noninvasive mechanical ventilatory support requiring intubation.  Admitted to ICU.  EEG was negative for seizures.  Was started on IV antibiotics due to concern for aspiration.  Extubated on 10/16/2022.  Transferred to [facility] service on 10/18/2022. Currently agreeable to SNF.   Assessment/Plan: Principal Problem:   Respiratory failure (HCC) Active Problems:   Alcohol abuse  Acute hypercarbic/hypoxic respiratory failure status postextubation 10/16/2022.   History of COPD and chronic alcohol abuse  Currently on Unasyn due to concern for aspiration.   - switch to Augmentin on 10/20/2022 to complete 7 days of antibiotics.   Continue DuoNebs  Continue incentive spirometer, pulmonary hygiene.  -dys diet)- reconsult SLP for eval- agreeable for MBS  Proteus mirabilis pneumonia, POA Proteus mirabilis grew from tracheal aspirate Sensitive to Unasyn, continue   Acute on Chronic Hyponatremia in the setting of beer potomania  On admission serum sodium 112 Currently serum sodium 130, stable. Encourage oral intake   Dysphagia  Seen by speech therapist on dysphagia 3 diet, nectar thick liquid, home meds with pure, per speech therapist Continue aspiration precautions -patient not compliant   Resolved post repletion: Hypomagnesia.  Resolved post repletion: Hypokalemia.   Prediabetes, blood  sugars are well-controlled A1c 5.6 on 09/02/2022.   Chronic EtOH abuse with concern for withdrawal Cocaine Abuse  UDS positive for cocaine on 09/20/2022. Continue folic acid supplement, thiamine supplement, multivitamins, out of window for alcohol withdrawal.   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 -unfortunately patient is not interested in following the advice of keeping heel off bed/NWB  Right elbow swelling -no fracture -?cellulitis vs thrombophlebitis -continues to improve   Agreeable to SNF   Time 50 minutes.     Labs   CBC: Last Labs          Recent Labs  Lab 10/15/22 0636 10/15/22 [id number] 10/15/22 0903 10/15/22 1148 10/16/22 0718 10/17/22 0602  WBC 15.8*  --   --   --  14.7* 13.8*  HGB 11.2* 12.6* 11.2* 11.9* 8.8* 9.0*  HCT 31.1* 37.0* 33.0* 35.0* 25.7* 26.7*  MCV 88.9  --   --   --  91.8 94.7  PLT 423*  --   --   --  418* 384        Code Status: Full code  Family Communication: called sister 2/9  Disposition Plan:  agreeable to SNF   Consultants: PCCM.     Subjective: Right elbow pain better  Objective: Vitals:   10/22/22 2038 10/23/22 0500 10/23/22 0726 10/23/22 0802  BP: (!) 153/87 125/74 (!) 143/76   Pulse: 91 92 86   Resp: 16 16 18   $ Temp: 97.8 F (36.6 C) 97.8 F (36.6 C) 98.9 F (37.2 C)   TempSrc:   Oral   SpO2: 99% 98% 100% 95%  Weight:  Height:        Intake/Output Summary (Last 24 hours) at 10/23/2022 1134 Last data filed at 10/23/2022 0900 Gross per 24 hour  Intake 540 ml  Output 800 ml  Net -260 ml   Filed Weights   10/17/22 0500 10/19/22 0500 10/20/22 0500  Weight: 60.9 kg 61 kg 61.1 kg    Exam:     General: Appearance:    Chronically ill appearing male in no acute distress     Lungs:     respirations unlabored  Heart:     Normal heart rate. Normal rhythm. No murmurs, rubs, or gallops.   MS:   Below knee amputation of left lower extremity is noted.  Neurologic:   Awake, alert        Data Reviewed: CBC: Recent Labs  Lab 10/17/22 0602 10/18/22 0306 10/19/22 0357 10/20/22 0428 10/22/22 0319  WBC 13.8* 14.2* 15.6* 13.2* 13.8*  HGB 9.0* 9.4* 10.8* 10.7* 9.8*  HCT 26.7* 27.6* 31.2* 32.5* 29.2*  MCV 94.7 93.9 93.7 96.4 94.8  PLT 384 402* 398 395 [id number]*   Basic Metabolic Panel: Recent Labs  Lab 10/17/22 0602 10/18/22 0306 10/19/22 0357 10/20/22 0428 10/22/22 0319  NA 131* 130* 130* 131* 128*  K 3.6 3.4* 3.6 3.9 4.0  CL 90* 90* 91* 93* 90*  CO2 32 31 32 28 29  GLUCOSE 94 100* 98 104* 107*  BUN 6* <5* <5* 6* 9  CREATININE 0.45* 0.52* 0.50* 0.48* 0.48*  CALCIUM 8.3* 8.4* 9.0 8.9 8.8*  MG 1.7 1.9 1.7  --   --   PHOS 4.2 3.7 3.4  --   --    GFR: Estimated Creatinine Clearance: 79.6 mL/min (A) (by C-G formula based on SCr of 0.48 mg/dL (L)). Liver Function Tests: No results for input(s): "AST", "ALT", "ALKPHOS", "BILITOT", "PROT", "ALBUMIN" in the last 168 hours.  No results for input(s): "LIPASE", "AMYLASE" in the last 168 hours. No results for input(s): "AMMONIA" in the last 168 hours. Coagulation Profile: No results for input(s): "INR", "PROTIME" in the last 168 hours. Cardiac Enzymes: No results for input(s): "CKTOTAL", "CKMB", "CKMBINDEX", "TROPONINI" in the last 168 hours. BNP (last 3 results) No results for input(s): "PROBNP" in the last 8760 hours. HbA1C: No results for input(s): "HGBA1C" in the last 72 hours. CBG: Recent Labs  Lab 10/22/22 1113 10/22/22 1709 10/22/22 2109 10/23/22 0727 10/23/22 1126  GLUCAP 105* 112* 136* 114* 103*   Lipid Profile: No results for input(s): "CHOL", "HDL", "LDLCALC", "TRIG", "CHOLHDL", "LDLDIRECT" in the last 72 hours.  Thyroid Function Tests: No results for input(s): "TSH", "T4TOTAL", "FREET4", "T3FREE", "THYROIDAB" in the last 72  hours. Anemia Panel: No results for input(s): "VITAMINB12", "FOLATE", "FERRITIN", "TIBC", "IRON", "RETICCTPCT" in the last 72 hours. Urine analysis:    Component Value Date/Time   COLORURINE STRAW (A) 09/20/2022 0229   APPEARANCEUR CLEAR 09/20/2022 0229   LABSPEC 1.003 (L) 09/20/2022 0229   PHURINE 7.0 09/20/2022 0229   GLUCOSEU NEGATIVE 09/20/2022 0229   HGBUR NEGATIVE 09/20/2022 0229   BILIRUBINUR NEGATIVE 09/20/2022 0229   KETONESUR NEGATIVE 09/20/2022 0229   PROTEINUR NEGATIVE 09/20/2022 0229   NITRITE NEGATIVE 09/20/2022 0229   LEUKOCYTESUR NEGATIVE 09/20/2022 0229    Recent Results (from the past 240 hour(s))  Resp panel by RT-PCR (RSV, Flu A&B, Covid) Anterior Nasal Swab     Status: None   Collection Time: 10/15/22  6:36 AM   Specimen: Anterior Nasal Swab  Result Value Ref Range Status   SARS Coronavirus 2  by RT PCR NEGATIVE NEGATIVE Final   Influenza A by PCR NEGATIVE NEGATIVE Final   Influenza B by PCR NEGATIVE NEGATIVE Final    Comment: (NOTE) The Xpert Xpress SARS-CoV-2/FLU/RSV plus assay is intended as an aid in the diagnosis of influenza from Nasopharyngeal swab specimens and should not be used as a sole basis for treatment. Nasal washings and aspirates are unacceptable for Xpert Xpress SARS-CoV-2/FLU/RSV testing.  Fact Sheet for Patients: EntrepreneurPulse.com.au  Fact Sheet for Healthcare Providers: IncredibleEmployment.be  This test is not yet approved or cleared by the [place] FDA and has been authorized for detection and/or diagnosis of SARS-CoV-2 by FDA under an Emergency Use Authorization (EUA). This EUA will remain in effect (meaning this test can be used) for the duration of the COVID-19 declaration under Section 564(b)(1) of the Act, 21 U.S.C. section 360bbb-3(b)(1), unless the authorization is terminated or revoked.     Resp Syncytial Virus by PCR NEGATIVE NEGATIVE Final    Comment: (NOTE) Fact Sheet  for Patients: EntrepreneurPulse.com.au  Fact Sheet for Healthcare Providers: IncredibleEmployment.be  This test is not yet approved or cleared by the [place] FDA and has been authorized for detection and/or diagnosis of SARS-CoV-2 by FDA under an Emergency Use Authorization (EUA). This EUA will remain in effect (meaning this test can be used) for the duration of the COVID-19 declaration under Section 564(b)(1) of the Act, 21 U.S.C. section 360bbb-3(b)(1), unless the authorization is terminated or revoked.  Performed at [facility], [place] [address], [place], [place] [address]   MRSA Next Gen by PCR, Nasal     Status: Abnormal   Collection Time: 10/15/22 11:09 AM  Result Value Ref Range Status   MRSA by PCR Next Gen DETECTED (A) NOT DETECTED Final    Comment: RESULT CALLED TO, READ BACK BY AND VERIFIED WITH: RN RPurvis Kilts [phone] @1603$  FH (NOTE) The GeneXpert MRSA Assay (FDA approved for NASAL specimens only), is one component of a comprehensive MRSA colonization surveillance program. It is not intended to diagnose MRSA infection nor to guide or monitor treatment for MRSA infections. Test performance is not FDA approved in patients less than 34 years old. Performed at [facility], [place] [address] [place], [place] [address]   Culture, blood (Routine X 2) w Reflex to ID Panel     Status: None   Collection Time: 10/15/22 11:35 AM   Specimen: BLOOD LEFT HAND  Result Value Ref Range Status   Specimen Description BLOOD LEFT HAND  Final   Special Requests   Final    BOTTLES DRAWN AEROBIC ONLY Blood Culture adequate volume   Culture   Final    NO GROWTH 5 DAYS Performed at [facility], [place] [address] [place], [place] [address]    Report Status 10/20/2022 FINAL  Final  Culture, blood (Routine X 2) w Reflex to ID Panel     Status: None   Collection Time: 10/15/22 11:35 AM   Specimen: BLOOD LEFT ARM  Result Value Ref Range  Status   Specimen Description BLOOD LEFT ARM  Final   Special Requests IN PEDIATRIC BOTTLE Blood Culture adequate volume  Final   Culture   Final    NO GROWTH 5 DAYS Performed at [facility], [place] [address], [place],  [address]    Report Status 10/20/2022 FINAL  Final  Culture, Respiratory w Gram Stain (tracheal aspirate)     Status: None   Collection Time: 10/16/22  9:59  AM   Specimen: Tracheal Aspirate; Respiratory  Result Value Ref Range Status   Specimen Description TRACHEAL ASPIRATE  Final   Special Requests NONE  Final   Gram Stain   Final    FEW WBC PRESENT, PREDOMINANTLY PMN NO ORGANISMS SEEN Performed at [facility], [address], [place], [place] [address]    Culture RARE PROTEUS MIRABILIS  Final   Report Status 10/19/2022 FINAL  Final   Organism ID, Bacteria PROTEUS MIRABILIS  Final      Susceptibility   Proteus mirabilis - MIC*    AMPICILLIN <=2 SENSITIVE Sensitive     CEFAZOLIN <=4 SENSITIVE Sensitive     CEFEPIME <=0.12 SENSITIVE Sensitive     CEFTAZIDIME <=1 SENSITIVE Sensitive     CEFTRIAXONE <=0.25 SENSITIVE Sensitive     CIPROFLOXACIN <=0.25 SENSITIVE Sensitive     GENTAMICIN <=1 SENSITIVE Sensitive     IMIPENEM 8 INTERMEDIATE Intermediate     TRIMETH/SULFA <=20 SENSITIVE Sensitive     AMPICILLIN/SULBACTAM <=2 SENSITIVE Sensitive     PIP/TAZO <=4 SENSITIVE Sensitive     * RARE PROTEUS MIRABILIS      Studies: No results found.  Scheduled Meds:  amoxicillin-clavulanate  1 tablet Oral Q12H   docusate  100 mg Per Tube BID   feeding supplement (NEPRO CARB STEADY)  237 mL Oral Q24H   fluticasone furoate-vilanterol  1 puff Inhalation Daily   folic acid  1 mg Per Tube Daily   heparin  5,000 Units Subcutaneous Q8H   insulin aspart  0-6 Units Subcutaneous TID AC & HS   multivitamin with minerals  1 tablet Oral Daily   pantoprazole  40 mg Oral Daily   polyethylene glycol  17 g Per Tube Daily   thiamine  100 mg Per Tube Daily     Continuous Infusions:     LOS: 8 days     Geradine Girt, DO [facility]   If 7PM-7AM, please contact night-coverage [email or web address] Password [facility] 10/23/2022, 11:34 AM

## 2022-10-23 NOTE — Progress Notes (Signed)
Modified Barium Swallow Study  Patient Details  Name: Ronald [name] MRN: [id number] Date of Birth: 1956-06-06  Today's Date: 10/23/2022  Modified Barium Swallow completed.  Full report located under Chart Review in the Imaging Section.  History of Present Illness This is a 67 year old gentleman admitted 2/2 after being found down at home after a recent d/c from hospital where he refused SNF. Pt was hyponatremic hypoxemic hypercarbic was placed on BiPAP in the emergency room. Ultimately failed BiPAP requiring intubation mechanical ventilation 2/2--2/3. Passed Yale swallow screen after extubation, then demonstrated consistent coughing with PO intake and was made NPO. Pt consistently denies difficulty swallowing. PMH: polysubstance abuse, cocaine, alcohol use. Pt had MBS 09/22/22 which was essentially normal.   Clinical Impression Patient presents with a mild oropharyngeal dysphagia as per this MBS. During oral phase, he exhibited delayed mastication, bolus formation and anterior to posterior transit of puree solids and mechanical soft solids but with full clearance of bolus from oral cavity. During pharyngeal phase, patient exhibited delays in initiation of swallow at level of vallecular sinus with puree solids but delays at level of pyriform sinus with all other tested consistencies (thin, nectar thick, honey thick, mechanical soft solids). Partial hyoid excursion observed and Full epiglottic inversion and airway closure achieved with all boluses, even when patient drinking thin liquids via straw sip or multiple cup sips. No pharyngeal residuals, no penetration and no aspiration observed with any of the PO boluses. SLP recommending Dys 3 (mechanical soft), and upgrade to thin liquids. Factors that may increase risk of adverse event in presence of aspiration Phineas Douglas & Padilla 2021):    Swallow Evaluation Recommendations Recommendations: PO diet PO Diet Recommendation: Dysphagia 3 (Mechanical  soft);Thin liquids (Level 0) Liquid Administration via: Cup Medication Administration: Whole meds with puree Supervision: Intermittent supervision/cueing for swallowing strategies Swallowing strategies  : Small bites/sips;Slow rate Postural changes: Position pt fully upright for meals Oral care recommendations: Oral care BID (2x/day)      Sonia Baller, MA, CCC-SLP Speech Therapy

## 2022-10-24 DIAGNOSIS — J1569 Pneumonia due to other gram-negative bacteria: Secondary | ICD-10-CM | POA: Diagnosis not present

## 2022-10-24 DIAGNOSIS — F1010 Alcohol abuse, uncomplicated: Secondary | ICD-10-CM | POA: Diagnosis not present

## 2022-10-24 DIAGNOSIS — J9601 Acute respiratory failure with hypoxia: Secondary | ICD-10-CM | POA: Diagnosis not present

## 2022-10-24 DIAGNOSIS — J441 Chronic obstructive pulmonary disease with (acute) exacerbation: Secondary | ICD-10-CM | POA: Diagnosis not present

## 2022-10-24 LAB — GLUCOSE, CAPILLARY
Glucose-Capillary: 104 mg/dL — ABNORMAL HIGH (ref 70–99)
Glucose-Capillary: 110 mg/dL — ABNORMAL HIGH (ref 70–99)
Glucose-Capillary: 103 mg/dL — ABNORMAL HIGH (ref 70–99)
Glucose-Capillary: 104 mg/dL — ABNORMAL HIGH (ref 70–99)
Glucose-Capillary: 126 mg/dL — ABNORMAL HIGH (ref 70–99)

## 2022-10-24 LAB — BASIC METABOLIC PANEL
Anion gap: 11 (ref 5–15)
BUN: 10 mg/dL (ref 8–23)
CO2: 26 mmol/L (ref 22–32)
Calcium: 8.9 mg/dL (ref 8.9–10.3)
Chloride: 93 mmol/L — ABNORMAL LOW (ref 98–111)
Creatinine, Ser: 0.48 mg/dL — ABNORMAL LOW (ref 0.61–1.24)
GFR, Estimated: >60 mL/min (ref >60)
Glucose, Bld: 113 mg/dL — ABNORMAL HIGH (ref 70–99)
Potassium: 4.4 mmol/L (ref 3.5–5.1)
Sodium: 130 mmol/L — ABNORMAL LOW (ref 135–145)

## 2022-10-24 LAB — CBC
HCT: 27.8 % — ABNORMAL LOW (ref 39.0–52.0)
Hemoglobin: 9.5 g/dL — ABNORMAL LOW (ref 13.0–17.0)
MCH: 32.1 pg (ref 26.0–34.0)
MCHC: 34.2 g/dL (ref 30.0–36.0)
MCV: 93.9 fL (ref 80.0–100.0)
Platelets: 460 10*3/uL — ABNORMAL HIGH (ref 150–400)
RBC: 2.96 MIL/uL — ABNORMAL LOW (ref 4.22–5.81)
RDW: 17.1 % — ABNORMAL HIGH (ref 11.5–15.5)
WBC: 8.6 10*3/uL (ref 4.0–10.5)
nRBC: 0 % (ref 0.0–0.2)

## 2022-10-24 NOTE — Plan of Care (Signed)
  Problem: Coping: Goal: Ability to adjust to condition or change in health will improve Outcome: Progressing   Problem: Fluid Volume: Goal: Ability to maintain a balanced intake and output will improve Outcome: Progressing   Problem: Metabolic: Goal: Ability to maintain appropriate glucose levels will improve Outcome: Progressing   Problem: Nutritional: Goal: Maintenance of adequate nutrition will improve Outcome: Progressing   

## 2022-10-24 NOTE — Progress Notes (Signed)
Speech Language Pathology Treatment: Dysphagia  Patient Details Name: [name] MRN: [id number] DOB: [birth year] Today's Date: 10/24/2022 Time: [id number] SLP Time Calculation (min) (ACUTE ONLY): 15 min  Assessment / Plan / Recommendation Clinical Impression  Patient seen by SLP for skilled treatment focused on dysphagia goals. When SLP arrived into room, patient awake, alert and agreeable to PO's. He frequently asks for candy despite SLP telling him he has none. SLP prepared him snack of cola (thin consistency) and saltines with peanut butter. Patient able to feed self but remained sitting at a partially reclined position in bed. He exhibited intermittent, delayed congested cough/throat clearing but otherwise appears to be tolerating PO's (Dys 3-mechanical soft solids and thin liquids; recommended as per [facility] 10/23/22). Of note, MBS completed previous date showed swallow initiation delays at level of pyriform sinus with liquids, but with no penetration, aspiration or pharyngeal residuals. (MBS completed during previous hospitalization on 09/22/22 showed same oral and pharyngeal delays as 10/23/22 MBS and no significant difference observed when comparing these two studies) During current and previous hospitalizations, patient has not been compliant with recommendations from SLP for sitting up adequately and slowing down his rapid PO intake. No further benefit indicated in continuing SLP intervention; recommend to continue on Dys 3, thin liquids diet. SLP to s/o at this time.    HPI HPI: This is a 67 year old gentleman admitted 2/2 after being found down at home after a recent d/c from hospital where he refused SNF. Pt was hyponatremic hypoxemic hypercarbic was placed on BiPAP in the emergency room. Ultimately failed BiPAP requiring intubation mechanical ventilation 2/2--2/3. Passed Yale swallow screen after extubation, then demonstrated consistent coughing with PO intake and was made NPO. Pt consistently  denies difficulty swallowing. PMH: polysubstance abuse, cocaine, alcohol use. Pt had MBS 09/22/22 which was essentially normal.      SLP Plan  Discharge SLP treatment due to (comment);All goals met      Recommendations for follow up therapy are one component of a multi-disciplinary discharge planning process, led by the attending physician.  Recommendations may be updated based on patient status, additional functional criteria and insurance authorization.    Recommendations  Diet recommendations: Dysphagia 3 (mechanical soft);Thin liquid Liquids provided via: Cup;Straw Medication Administration: Whole meds with puree Supervision: Patient able to self feed;Intermittent supervision to cue for compensatory strategies Compensations: Minimize environmental distractions;Slow rate;Small sips/bites Postural Changes and/or Swallow Maneuvers: Seated upright 90 degrees;Upright 30-60 min after meal                Oral Care Recommendations: Oral care BID Follow Up Recommendations: No SLP follow up Assistance recommended at discharge: None SLP Visit Diagnosis: Dysphagia, oropharyngeal phase (R13.12) Plan: Discharge SLP treatment due to (comment);All goals met          Sonia Baller, MA, CCC-SLP Speech Therapy

## 2022-10-24 NOTE — Progress Notes (Signed)
PROGRESS NOTE  [name] PX:[id number] DOB: 1956/08/10 DOA: 10/15/2022 PCP: Demetrios Isaacs, FNP  HPI/Recap of past 36 hours: 67 year old male who is known polysubstance abuse, EtOH abuse cocaine abuse, noncompliance, unstageable pressure injury to right heel, peripheral vascular disease, who presents from home via EMS with altered mental status along with noncompliance. Recently admitted and discharged with concern that he needed to go to a skilled nursing facility for which he refused. He now presents with a sodium of 125, altered mental status, hypoxic and hypercarbic, and noted to be aspirating while on noninvasive mechanical ventilatory support requiring intubation.  Admitted to ICU.  EEG was negative for seizures.  Was started on IV antibiotics due to concern for aspiration.  Extubated on 10/16/2022.  Transferred to [facility] service on 10/18/2022. Currently agreeable to SNF.   Assessment/Plan: Principal Problem:   Respiratory failure (HCC) Active Problems:   Alcohol abuse  Acute hypercarbic/hypoxic respiratory failure status postextubation 10/16/2022.   History of COPD and chronic alcohol abuse  Currently on Unasyn due to concern for aspiration.   - switch to Augmentin on 10/20/2022 to complete 7 days of antibiotics.   Continue DuoNebs  Continue incentive spirometer, pulmonary hygiene.  -dys diet)- reconsult SLP- liquids thinned  Proteus mirabilis pneumonia, POA Proteus mirabilis grew from tracheal aspirate Sensitive to Unasyn, continue   Acute on Chronic Hyponatremia in the setting of beer potomania  On admission serum sodium 112 Currently serum sodium 130, stable. Encourage oral intake   Dysphagia  Seen by speech therapist on dysphagia 3 diet, nectar thick liquid, home meds with pure, per speech therapist Continue aspiration precautions -patient not compliant   Resolved post repletion: Hypomagnesia.  Resolved post repletion: Hypokalemia.   Prediabetes, blood sugars are  well-controlled A1c 5.6 on 09/02/2022.   Chronic EtOH abuse with concern for withdrawal Cocaine Abuse  UDS positive for cocaine on 09/20/2022. Continue folic acid supplement, thiamine supplement, multivitamins, out of window for alcohol withdrawal.   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 -unfortunately patient is not interested in following the advice of keeping heel off bed/NWB  Right elbow swelling -no fracture -resolved   Agreeable to SNF   Time 50 minutes.     Labs   CBC: Last Labs          Recent Labs  Lab 10/15/22 0636 10/15/22 [id number] 10/15/22 0903 10/15/22 1148 10/16/22 0718 10/17/22 0602  WBC 15.8*  --   --   --  14.7* 13.8*  HGB 11.2* 12.6* 11.2* 11.9* 8.8* 9.0*  HCT 31.1* 37.0* 33.0* 35.0* 25.7* 26.7*  MCV 88.9  --   --   --  91.8 94.7  PLT 423*  --   --   --  418* 384        Code Status: Full code  Family Communication: called sister 2/9  Disposition Plan:  agreeable to SNF   Consultants: PCCM.     Subjective: Happy to be on thin liquids   Objective: Vitals:   10/23/22 2100 10/24/22 0500 10/24/22 0800 10/24/22 0805  BP: 136/80  139/88 139/88  Pulse: 95  85 85  Resp: 18  17 17  $ Temp: 98.5 F (36.9 C)  98.3 F (36.8 C) 98.3 F (36.8 C)  TempSrc: Oral  Oral Oral  SpO2: 94%  98% 98%  Weight:  60.1 kg    Height:  Intake/Output Summary (Last 24 hours) at 10/24/2022 1114 Last data filed at 10/24/2022 [id number] Gross per 24 hour  Intake 480 ml  Output 2325 ml  Net -1845 ml   Filed Weights   10/19/22 0500 10/20/22 0500 10/24/22 0500  Weight: 61 kg 61.1 kg 60.1 kg    Exam:     General: Appearance:    Chronically ill appearing male in no acute distress     Lungs:     respirations unlabored  Heart:    Normal heart rate. Normal rhythm. No murmurs, rubs,  or gallops.   MS:   Below knee amputation of left lower extremity is noted.  Neurologic:   Awake, alert        Data Reviewed: CBC: Recent Labs  Lab 10/18/22 0306 10/19/22 0357 10/20/22 0428 10/22/22 0319 10/24/22 0337  WBC 14.2* 15.6* 13.2* 13.8* 8.6  HGB 9.4* 10.8* 10.7* 9.8* 9.5*  HCT 27.6* 31.2* 32.5* 29.2* 27.8*  MCV 93.9 93.7 96.4 94.8 93.9  PLT 402* 398 395 417* 123456*   Basic Metabolic Panel: Recent Labs  Lab 10/18/22 0306 10/19/22 0357 10/20/22 0428 10/22/22 0319 10/24/22 0337  NA 130* 130* 131* 128* 130*  K 3.4* 3.6 3.9 4.0 4.4  CL 90* 91* 93* 90* 93*  CO2 31 32 28 29 26  $ GLUCOSE 100* 98 104* 107* 113*  BUN <5* <5* 6* 9 10  CREATININE 0.52* 0.50* 0.48* 0.48* 0.48*  CALCIUM 8.4* 9.0 8.9 8.8* 8.9  MG 1.9 1.7  --   --   --   PHOS 3.7 3.4  --   --   --    GFR: Estimated Creatinine Clearance: 78.3 mL/min (A) (by C-G formula based on SCr of 0.48 mg/dL (L)). Liver Function Tests: No results for input(s): "AST", "ALT", "ALKPHOS", "BILITOT", "PROT", "ALBUMIN" in the last 168 hours.  No results for input(s): "LIPASE", "AMYLASE" in the last 168 hours. No results for input(s): "AMMONIA" in the last 168 hours. Coagulation Profile: No results for input(s): "INR", "PROTIME" in the last 168 hours. Cardiac Enzymes: No results for input(s): "CKTOTAL", "CKMB", "CKMBINDEX", "TROPONINI" in the last 168 hours. BNP (last 3 results) No results for input(s): "PROBNP" in the last 8760 hours. HbA1C: No results for input(s): "HGBA1C" in the last 72 hours. CBG: Recent Labs  Lab 10/23/22 1126 10/23/22 1616 10/23/22 1933 10/24/22 0806 10/24/22 0827  GLUCAP 103* 120* 110* 104* 110*   Lipid Profile: No results for input(s): "CHOL", "HDL", "LDLCALC", "TRIG", "CHOLHDL", "LDLDIRECT" in the last 72 hours.  Thyroid Function Tests: No results for input(s): "TSH", "T4TOTAL", "FREET4", "T3FREE", "THYROIDAB" in the last 72 hours. Anemia Panel: No results for input(s):  "VITAMINB12", "FOLATE", "FERRITIN", "TIBC", "IRON", "RETICCTPCT" in the last 72 hours. Urine analysis:    Component Value Date/Time   COLORURINE STRAW (A) 09/20/2022 0229   APPEARANCEUR CLEAR 09/20/2022 0229   LABSPEC 1.003 (L) 09/20/2022 0229   PHURINE 7.0 09/20/2022 0229   GLUCOSEU NEGATIVE 09/20/2022 0229   HGBUR NEGATIVE 09/20/2022 0229   BILIRUBINUR NEGATIVE 09/20/2022 0229   KETONESUR NEGATIVE 09/20/2022 0229   PROTEINUR NEGATIVE 09/20/2022 0229   NITRITE NEGATIVE 09/20/2022 0229   LEUKOCYTESUR NEGATIVE 09/20/2022 0229    Recent Results (from the past 240 hour(s))  Resp panel by RT-PCR (RSV, Flu A&B, Covid) Anterior Nasal Swab     Status: None   Collection Time: 10/15/22  6:36 AM   Specimen: Anterior Nasal Swab  Result Value Ref Range Status   SARS Coronavirus 2 by RT PCR NEGATIVE  NEGATIVE Final   Influenza A by PCR NEGATIVE NEGATIVE Final   Influenza B by PCR NEGATIVE NEGATIVE Final    Comment: (NOTE) The Xpert Xpress SARS-CoV-2/FLU/RSV plus assay is intended as an aid in the diagnosis of influenza from Nasopharyngeal swab specimens and should not be used as a sole basis for treatment. Nasal washings and aspirates are unacceptable for Xpert Xpress SARS-CoV-2/FLU/RSV testing.  Fact Sheet for Patients: EntrepreneurPulse.com.au  Fact Sheet for Healthcare Providers: IncredibleEmployment.be  This test is not yet approved or cleared by the [place] FDA and has been authorized for detection and/or diagnosis of SARS-CoV-2 by FDA under an Emergency Use Authorization (EUA). This EUA will remain in effect (meaning this test can be used) for the duration of the COVID-19 declaration under Section 564(b)(1) of the Act, 21 U.S.C. section 360bbb-3(b)(1), unless the authorization is terminated or revoked.     Resp Syncytial Virus by PCR NEGATIVE NEGATIVE Final    Comment: (NOTE) Fact Sheet for  Patients: EntrepreneurPulse.com.au  Fact Sheet for Healthcare Providers: IncredibleEmployment.be  This test is not yet approved or cleared by the [place] FDA and has been authorized for detection and/or diagnosis of SARS-CoV-2 by FDA under an Emergency Use Authorization (EUA). This EUA will remain in effect (meaning this test can be used) for the duration of the COVID-19 declaration under Section 564(b)(1) of the Act, 21 U.S.C. section 360bbb-3(b)(1), unless the authorization is terminated or revoked.  Performed at [facility], [place] [address], [place], [place] [address]   MRSA Next Gen by PCR, Nasal     Status: Abnormal   Collection Time: 10/15/22 11:09 AM  Result Value Ref Range Status   MRSA by PCR Next Gen DETECTED (A) NOT DETECTED Final    Comment: RESULT CALLED TO, READ BACK BY AND VERIFIED WITH: RN RPurvis Kilts [phone] @1603$  FH (NOTE) The GeneXpert MRSA Assay (FDA approved for NASAL specimens only), is one component of a comprehensive MRSA colonization surveillance program. It is not intended to diagnose MRSA infection nor to guide or monitor treatment for MRSA infections. Test performance is not FDA approved in patients less than 14 years old. Performed at [facility], [place] [address] [place], [place] [address]   Culture, blood (Routine X 2) w Reflex to ID Panel     Status: None   Collection Time: 10/15/22 11:35 AM   Specimen: BLOOD LEFT HAND  Result Value Ref Range Status   Specimen Description BLOOD LEFT HAND  Final   Special Requests   Final    BOTTLES DRAWN AEROBIC ONLY Blood Culture adequate volume   Culture   Final    NO GROWTH 5 DAYS Performed at [facility], [place] [address], [place], [place] [address]    Report Status 10/20/2022 FINAL  Final  Culture, blood (Routine X 2) w Reflex to ID Panel     Status: None   Collection Time: 10/15/22 11:35 AM   Specimen: BLOOD LEFT ARM  Result Value Ref Range  Status   Specimen Description BLOOD LEFT ARM  Final   Special Requests IN PEDIATRIC BOTTLE Blood Culture adequate volume  Final   Culture   Final    NO GROWTH 5 DAYS Performed at [facility], [place] [address] [place], [place] [address]    Report Status 10/20/2022 FINAL  Final  Culture, Respiratory w Gram Stain (tracheal aspirate)     Status: None   Collection Time: 10/16/22  9:59 AM   Specimen:  Tracheal Aspirate; Respiratory  Result Value Ref Range Status   Specimen Description TRACHEAL ASPIRATE  Final   Special Requests NONE  Final   Gram Stain   Final    FEW WBC PRESENT, PREDOMINANTLY PMN NO ORGANISMS SEEN Performed at [facility], [address] [place], [place] [address]    Culture RARE PROTEUS MIRABILIS  Final   Report Status 10/19/2022 FINAL  Final   Organism ID, Bacteria PROTEUS MIRABILIS  Final      Susceptibility   Proteus mirabilis - MIC*    AMPICILLIN <=2 SENSITIVE Sensitive     CEFAZOLIN <=4 SENSITIVE Sensitive     CEFEPIME <=0.12 SENSITIVE Sensitive     CEFTAZIDIME <=1 SENSITIVE Sensitive     CEFTRIAXONE <=0.25 SENSITIVE Sensitive     CIPROFLOXACIN <=0.25 SENSITIVE Sensitive     GENTAMICIN <=1 SENSITIVE Sensitive     IMIPENEM 8 INTERMEDIATE Intermediate     TRIMETH/SULFA <=20 SENSITIVE Sensitive     AMPICILLIN/SULBACTAM <=2 SENSITIVE Sensitive     PIP/TAZO <=4 SENSITIVE Sensitive     * RARE PROTEUS MIRABILIS      Studies: DG Swallowing Func-Speech Pathology  Result Date: 10/23/2022 Table formatting from the original result was not included. Modified Barium Swallow Study Patient Details Name: [name] MRN: [id number] Date of Birth: 1956-06-07 Today's Date: 10/23/2022 HPI/PMH: HPI: This is a 67 year old gentleman admitted 2/2 after being found down at home after a recent d/c from hospital where he refused SNF. Pt was hyponatremic hypoxemic hypercarbic was placed on BiPAP in the emergency room. Ultimately failed BiPAP requiring intubation  mechanical ventilation 2/2--2/3. Passed [facility] swallow screen after extubation, then demonstrated consistent coughing with PO intake and was made NPO. Pt consistently denies difficulty swallowing. PMH: polysubstance abuse, cocaine, alcohol use. Pt had MBS 09/22/22 which was essentially normal. Clinical Impression: Clinical Impression: Patient presents with a mild oropharyngeal dysphagia as per this MBS. During oral phase, he exhibited delayed mastication, bolus formation and anterior to posterior transit of puree solids and mechanical soft solids but with full clearance of bolus from oral cavity. During pharyngeal phase, patient exhibited delays in initiation of swallow at level of vallecular sinus with puree solids but delays at level of pyriform sinus with all other tested consistencies (thin, nectar thick, honey thick, mechanical soft solids). Partial hyoid excursion observed and Full epiglottic inversion and airway closure achieved with all boluses, even when patient drinking thin liquids via straw sip or multiple cup sips. No pharyngeal residuals, no penetration and no aspiration observed with any of the PO boluses. SLP recommending Dys 3 (mechanical soft), and upgrade to thin liquids. Factors that may increase risk of adverse event in presence of aspiration (Parker 2021): No data recorded Recommendations/Plan: Swallowing Evaluation Recommendations Swallowing Evaluation Recommendations Recommendations: PO diet PO Diet Recommendation: Dysphagia 3 (Mechanical soft); Thin liquids (Level 0) Liquid Administration via: Cup Medication Administration: Whole meds with puree Supervision: Intermittent supervision/cueing for swallowing strategies Swallowing strategies  : Small bites/sips; Slow rate Postural changes: Position pt fully upright for meals Oral care recommendations: Oral care BID (2x/day) Treatment Plan Treatment Plan Treatment recommendations: Therapy as outlined in treatment plan below Follow-up  recommendations: No SLP follow up Functional status assessment: Patient has had a recent decline in their functional status and demonstrates the ability to make significant improvements in function in a reasonable and predictable amount of time. Treatment frequency: Min 1x/week Treatment duration: 1 week Interventions: Aspiration precaution training; Compensatory techniques; Patient/family education; Diet toleration management by SLP  Recommendations Recommendations for follow up therapy are one component of a multi-disciplinary discharge planning process, led by the attending physician.  Recommendations may be updated based on patient status, additional functional criteria and insurance authorization. Assessment: Orofacial Exam: Orofacial Exam Oral Cavity: Oral Hygiene: WFL Oral Cavity - Dentition: Missing dentition Orofacial Anatomy: WFL Oral Motor/Sensory Function: WFL Anatomy: Anatomy: WFL Thin Liquids: Thin Liquids (Level 0) Thin Liquids : Impaired Bolus delivery method: Spoon; Cup; Straw Thin Liquid - Impairment: Pharyngeal impairment Initiation of swallow : Pyriform sinuses Soft palate elevation: Complete Laryngeal elevation: Complete superior movement of thyroid cartilage with complete approximation of arytenoids to epiglottic petiole Anterior hyoid excursion: Partial Epiglottic movement: Complete Laryngeal vestibule closure: Complete: No air/contrast in laryngeal vestibule Pharyngeal stripping wave : Present - complete Pharyngoesophageal segment opening: Complete distension and complete duration, no obstruction of flow Tongue base retraction: No contrast between tongue base and posterior pharyngeal wall (PPW) Pharyngeal residue: Complete pharyngeal clearance Location of pharyngeal residue: N/A Penetration/Aspiration Scale (PAS) score: 1.  Material does not enter airway  Mildly Thick Liquids: Mildly thick liquids (Level 2, nectar thick) Mildly thick liquids (Level 2, nectar thick): Impaired Bolus delivery  method: Spoon; Cup Mildly Thick Liquid - Impairment: Pharyngeal impairment Initiation of swallow : Pyriform sinuses Soft palate elevation: Complete Laryngeal elevation: Complete superior movement of thyroid cartilage with complete approximation of arytenoids to epiglottic petiole Anterior hyoid excursion: Partial Epiglottic movement: Complete Laryngeal vestibule closure: Complete: No air/contrast in laryngeal vestibule Pharyngeal stripping wave : Present - complete Pharyngeal contraction (A/P view only): N/A Pharyngoesophageal segment opening: Complete distension and complete duration, no obstruction of flow Tongue base retraction: No contrast between tongue base and posterior pharyngeal wall (PPW) Pharyngeal residue: Complete pharyngeal clearance Location of pharyngeal residue: N/A Penetration/Aspiration Scale (PAS) score: 1.  Material does not enter airway  Moderately Thick Liquids: Moderately thick liquids (Level 3, honey thick) Moderately thick liquids (Level 3, honey thick): Impaired Bolus delivery method: Spoon Moderately Thick Liquid - Impairment: Pharyngeal impairment Initiation of swallow : Pyriform sinuses Soft palate elevation: Complete Laryngeal elevation: Complete superior movement of thyroid cartilage with complete approximation of arytenoids to epiglottic petiole Anterior hyoid excursion: Partial Epiglottic movement: Complete Laryngeal vestibule closure: Complete: No air/contrast in laryngeal vestibule Pharyngeal stripping wave : Present - complete Pharyngeal contraction (A/P view only): N/A Pharyngoesophageal segment opening: Complete distension and complete duration, no obstruction of flow Tongue base retraction: No contrast between tongue base and posterior pharyngeal wall (PPW) Pharyngeal residue: Complete pharyngeal clearance Location of pharyngeal residue: N/A Penetration/Aspiration Scale (PAS) score: 1.  Material does not enter airway  Puree: Puree Puree: Impaired Puree - Impairment: Pharyngeal  impairment; Oral Impairment Lip Closure: No labial escape Bolus transport/lingual motion: Brisk tongue motion Oral residue: Complete oral clearance Location of oral residue : N/A Initiation of swallow: Valleculae Soft palate elevation: Complete Laryngeal elevation: Complete superior movement of thyroid cartilage with complete approximation of arytenoids to epiglottic petiole Anterior hyoid excursion: Partial Epiglottic movement: Complete Laryngeal vestibule closure: Complete: No air/contrast in laryngeal vestibule Pharyngeal stripping wave : Present - complete Pharyngeal contraction (A/P view only): N/A Pharyngoesophageal segment opening: Complete distension and complete duration, no obstruction of flow Tongue base retraction: No contrast between tongue base and posterior pharyngeal wall (PPW) Pharyngeal residue: N/A Location of pharyngeal residue: N/A Penetration/Aspiration Scale (PAS) score: 1.  Material does not enter airway Solid: Solid Solid: Impaired Solid - Impairment: Oral Impairment; Pharyngeal impairment Lip Closure: No labial escape Bolus preparation/mastication: Slow prolonged chewing/mashing with complete recollection Bolus transport/lingual  motion: Brisk tongue motion Oral residue: Complete oral clearance Location of oral residue : N/A Initiation of swallow: Pyriform sinuses Soft palate elevation: Complete Laryngeal elevation: Complete superior movement of thyroid cartilage with complete approximation of arytenoids to epiglottic petiole Anterior hyoid excursion: Partial Epiglottic movement: Complete Laryngeal vestibule closure: Complete: No air/contrast in laryngeal vestibule Pharyngeal stripping wave : Present - complete Pharyngoesophageal segment opening: Complete distension and complete duration, no obstruction of flow Tongue base retraction: No contrast between tongue base and posterior pharyngeal wall (PPW) Pharyngeal residue: Complete pharyngeal clearance Location of pharyngeal residue: N/A  Penetration/Aspiration Scale (PAS) score: 1.  Material does not enter airway Pill: Pill Pill: Impaired Consistency administered : thin Pill - Impairment: Oral Impairment Lip Closure: No labial escape Bolus transport/lingual motion: Brisk tongue motion Oral residue: Majority of bolus remaining Location of oral residue : Tongue Compensatory Strategies: Compensatory Strategies Compensatory strategies: Yes Straw: Ineffective   General Information: No data recorded Diet Prior to this Study: Dysphagia 3 (mechanical soft); Mildly thick liquids (Level 2, nectar thick)   Temperature : Normal   Respiratory Status: WFL   Supplemental O2: None (Room air)   History of Recent Intubation: Yes  Behavior/Cognition: Alert; Cooperative; Pleasant mood Self-Feeding Abilities: Able to self-feed Baseline vocal quality/speech: Normal; Dysphonic Volitional Cough: Able to elicit Volitional Swallow: Able to elicit Exam Limitations: No limitations Goal Planning: Prognosis for improved oropharyngeal function: Good Barriers to Reach Goals: Behavior Barriers/Prognosis Comment: patient has h/o non-compliance and agitation Patient/Family Stated Goal: wants to stop drinking thickened liquids Consulted and agree with results and recommendations: Patient Pain: Pain Assessment Pain Assessment: No/denies pain Pain Score: 0 Faces Pain Scale: 0 Breathing: 0 Negative Vocalization: 0 Facial Expression: 0 Body Language: 0 Consolability: 0 PAINAD Score: 0 Facial Expression: 0 Body Movements: 0 Muscle Tension: 0 Compliance with ventilator (intubated pts.): 1 Vocalization (extubated pts.): N/A CPOT Total: 1 Pain Location: RUE elbow and R foot Pain Descriptors / Indicators: Grimacing; Guarding; Sore Pain Intervention(s): Limited activity within patient's tolerance; Monitored during session End of Session: Start Time:SLP Start Time (ACUTE ONLY): [id number] Stop Time: SLP Stop Time (ACUTE ONLY): 1415 Time Calculation:SLP Time Calculation (min) (ACUTE ONLY): 20 min  Charges: SLP Evaluations $ SLP Speech Visit: 1 Visit SLP Evaluations $BSS Swallow: 1 Procedure $MBS Swallow: 1 Procedure $Swallowing Treatment: 1 Procedure SLP visit diagnosis: SLP Visit Diagnosis: Dysphagia, oropharyngeal phase (R13.12) Past Medical History: Past Medical History: Diagnosis Date  Critical lower limb ischemia (HCC)   ETOH abuse   Gout   Hypertension  Past Surgical History: Past Surgical History: Procedure Laterality Date  ABDOMINAL AORTAGRAM N/A 12/26/2013  Procedure: ABDOMINAL Maxcine Ham;  Surgeon: Serafina Mitchell, MD;  Location: [facility] CATH LAB;  Service: Cardiovascular;  Laterality: N/A;  AMPUTATION Left 12/28/2013  Procedure: LEFT BELOW KNEE AMPUTATION;  Surgeon: Newt Minion, MD;  Location: [facility];  Service: Orthopedics;  Laterality: Left;  BELOW KNEE LEG AMPUTATION    Left  LEG SURGERY   Sonia Baller, MA, CCC-SLP Speech Therapy    Scheduled Meds:  amoxicillin-clavulanate  1 tablet Oral Q12H   docusate  100 mg Per Tube BID   feeding supplement (NEPRO CARB STEADY)  237 mL Oral Q24H   fluticasone furoate-vilanterol  1 puff Inhalation Daily   folic acid  1 mg Per Tube Daily   heparin  5,000 Units Subcutaneous Q8H   insulin aspart  0-6 Units Subcutaneous TID AC & HS   multivitamin with minerals  1 tablet Oral Daily   pantoprazole  40 mg Oral Daily   polyethylene glycol  17 g Per Tube Daily   thiamine  100 mg Per Tube Daily    Continuous Infusions:     LOS: 9 days     Geradine Girt, DO [facility]   If 7PM-7AM, please contact night-coverage [email or web address] Password [facility] 10/24/2022, 11:14 AM

## 2022-10-25 DIAGNOSIS — J9601 Acute respiratory failure with hypoxia: Secondary | ICD-10-CM | POA: Diagnosis not present

## 2022-10-25 DIAGNOSIS — J441 Chronic obstructive pulmonary disease with (acute) exacerbation: Secondary | ICD-10-CM | POA: Diagnosis not present

## 2022-10-25 DIAGNOSIS — J1569 Pneumonia due to other gram-negative bacteria: Secondary | ICD-10-CM | POA: Diagnosis not present

## 2022-10-25 LAB — GLUCOSE, CAPILLARY
Glucose-Capillary: 108 mg/dL — ABNORMAL HIGH (ref 70–99)
Glucose-Capillary: 113 mg/dL — ABNORMAL HIGH (ref 70–99)
Glucose-Capillary: 114 mg/dL — ABNORMAL HIGH (ref 70–99)

## 2022-10-25 NOTE — Progress Notes (Signed)
Physical Therapy Treatment Patient Details Name: Ronald [name] MRN: [id number] DOB: 12/17/1955 Today's Date: 10/25/2022   History of Present Illness This is a 67 year old gentleman admitted 2/2 after being found down at home after a recent d/c from hospital where he refused SNF. Pt was hyponatremic hypoxemic hypercarbic was placed on BiPAP in the emergency room. Ultimately failed BiPAP requiring intubation mechanical ventilation 2/2--2/3. 2/6: PTA discussed with DO pt R heel ulcer (POA) and pt NWB as of 2/7. PMH: polysubstance abuse, cocaine, alcohol use, R heel pressure injury.    PT Comments    Pt was received in supine and agreeable to therapy.  Pt demonstrated difficulty with use of BUE to scoot requiring min A and pt reporting that he needed to utilize RLE despite cues. Pt declined use of prevalon boot during transfer for R heel protection. Pt continued to become agitated when reminded of RLE NWB, however was easily redirected. Pt with improved anterior scoot from bed to chair with PTA slightly raising RLE to maintain NWB. Pt demonstrated difficulty with bed mobility due to impaired balance, but able to complete with up to min guard for safety. Pt left in bed with pillow under RLE to reduce pressure on R heel. Pt continues to benefit from PT services to progress toward functional mobility goals.     Recommendations for follow up therapy are one component of a multi-disciplinary discharge planning process, led by the attending physician.  Recommendations may be updated based on patient status, additional functional criteria and insurance authorization.  Follow Up Recommendations  Skilled nursing-short term rehab (<3 hours/day) Can patient physically be transported by private vehicle: No   Assistance Recommended at Discharge Frequent or constant Supervision/Assistance  Patient can return home with the following Assistance with cooking/housework;Direct supervision/assist for financial  management;Direct supervision/assist for medications management;Assist for transportation;A little help with bathing/dressing/bathroom;A lot of help with walking and/or transfers;Help with stairs or ramp for entrance   Equipment Recommendations  Wheelchair (measurements PT);Wheelchair cushion (measurements PT);Other (comment)    Recommendations for Other Services       Precautions / Restrictions Precautions Precautions: Fall Precaution Comments: 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 Pt relying on LUE support on elevated HOB from supine to sit requiring increased effort and cues to shift to center.    Transfers Overall transfer level: Needs assistance Equipment used: None Transfers: Bed to chair/wheelchair/BSC         Anterior-Posterior transfers: Min assist   General transfer comment: Pt requiring min A with bed pad to raise bottom to scoot. Heavy cues for RLE NWB, however pt stated "I have to" and did not comply. Pt required min A to raise RLE off of bed to maintain NWB during anterior scoot from chair to bed.        Balance Overall balance assessment: Needs assistance Sitting-balance support: Single extremity supported, Feet unsupported Sitting balance-Leahy Scale: Fair   Postural control: Left lateral lean (Towards elevated HOB with LUE support)     Standing balance comment: N/A-  now NWB on RLE                            Cognition Arousal/Alertness: Awake/alert Behavior During Therapy:  WFL for tasks assessed/performed Overall Cognitive Status: No family/caregiver present to determine baseline cognitive functioning                                          Exercises General Exercises - Lower Extremity Long Arc Quad: AROM, Right, 10 reps, Seated Hip Flexion/Marching: AROM, 10 reps, Seated, Both    General Comments        Pertinent Vitals/Pain Pain Assessment Pain Assessment: 0-10 Pain Score: 8  Pain Location: RUE elbow and R foot Pain Descriptors / Indicators: Grimacing, Guarding, Sore Pain Intervention(s): Limited activity within patient's tolerance, Monitored during session, Repositioned     PT Goals (current goals can now be found in the care plan section) Acute Rehab PT Goals Patient Stated Goal: To go home PT Goal Formulation: With patient Time For Goal Achievement: 10/30/22 Potential to Achieve Goals: Fair Progress towards PT goals: Progressing toward goals    Frequency    Min 3X/week      PT Plan Current plan remains appropriate       AM-PAC PT "6 Clicks" Mobility   Outcome Measure  Help needed turning from your back to your side while in a flat bed without using bedrails?: A Little Help needed moving from lying on your back to sitting on the side of a flat bed without using bedrails?: A Little Help needed moving to and from a bed to a chair (including a wheelchair)?: A Little Help needed standing up from a chair using your arms (e.g., wheelchair or bedside chair)?: Total Help needed to walk in hospital room?: Total Help needed climbing 3-5 steps with a railing? : Total 6 Click Score: 12    End of Session   Activity Tolerance: Patient tolerated treatment well;Patient limited by pain Patient left: in bed;with call bell/phone within reach;with bed alarm set Nurse Communication: Mobility status PT Visit Diagnosis: Other abnormalities of gait and mobility (R26.89);Muscle weakness (generalized) (M62.81);History of falling  (Z91.81)     Time: [id number] PT Time Calculation (min) (ACUTE ONLY): 17 min  Charges:  $Therapeutic Activity: 8-22 mins                     Michelle Nasuti, PTA Acute Rehabilitation Services Secure Chat Preferred  [phone]    Michelle Nasuti 10/25/2022, 11:46 AM

## 2022-10-25 NOTE — Progress Notes (Signed)
PROGRESS NOTE  [name] [id number] DOB: 08/15/1956 DOA: 10/15/2022 PCP: Demetrios Isaacs, FNP  HPI/Recap of past 6 hours: 67 year old male who is known polysubstance abuse, EtOH abuse cocaine abuse, noncompliance, unstageable pressure injury to right heel, peripheral vascular disease, who presents from home via EMS with altered mental status along with noncompliance. Recently admitted and discharged with concern that he needed to go to a skilled nursing facility for which he refused. He now presents with a sodium of 125, altered mental status, hypoxic and hypercarbic, and noted to be aspirating while on noninvasive mechanical ventilatory support requiring intubation.  Admitted to ICU.  EEG was negative for seizures.  Was started on IV antibiotics due to concern for aspiration.  Extubated on 10/16/2022.  [facility] on 10[facility]18/20[facility]. Currently agreeable to SNF.   Assessment/Plan: Principal Problem:   Respiratory failure (HCC) Active Problems:   Alcohol abuse  Acute hypercarbic/hypoxic respiratory failure status postextubation 10/16/2022.   History of COPD and chronic alcohol abuse  Currently on Unasyn due to concern for aspiration.   - switch to Augmentin on 10/20/2022 to complete 7 days of antibiotics.   Continue DuoNebs  Continue incentive spirometer, pulmonary hygiene.  -dys diet)- reconsult SLP- liquids thinned  Proteus mirabilis pneumonia, POA Proteus mirabilis grew from tracheal aspirate Sensitive to Unasyn, continue   Acute on Chronic Hyponatremia in the setting of beer potomania  On admission serum sodium 112 Currently serum sodium 130, stable. Encourage oral intake   Dysphagia  Seen by speech therapist on dysphagia 3 diet, nectar thick liquid, home meds with pure, per speech therapist Continue aspiration precautions -patient not compliant   Resolved post repletion: Hypomagnesia.  Resolved post repletion: Hypokalemia.   Prediabetes, blood sugars are  well-controlled A1c 5.6 on 09/02/2022.   Chronic EtOH abuse with concern for withdrawal Cocaine Abuse  UDS positive for cocaine on 09/20/2022. Continue folic acid supplement, thiamine supplement, multivitamins, out of window for alcohol withdrawal.   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 -unfortunately patient is not interested in following the advice of keeping heel off bed/NWB  Right elbow swelling -no fracture -resolved   Agreeable to SNF   Time 50 minutes.     Labs   CBC: Last Labs          Recent Labs  Lab 10/15/22 0636 10/15/22 [id number] 10/15/22 0903 10/15/22 1148 10/16/22 0718 10/17/22 0602  WBC 15.8*  --   --   --  14.7* 13.8*  HGB 11.2* 12.6* 11.2* 11.9* 8.8* 9.0*  HCT 31.1* 37.0* 33.0* 35.0* 25.7* 26.7*  MCV 88.9  --   --   --  91.8 94.7  PLT 423*  --   --   --  418* 384        Code Status: Full code  Family Communication: called sister 2/9  Disposition Plan:  agreeable to SNF   Consultants: PCCM.     Subjective: No complaints  Objective: Vitals:   10/24/22 1441 10/24/22 2111 10/25/22 0534 10/25/22 0807  BP: 136/78 (!) 146/88 (!) 136/93 (!) 141/88  Pulse: 99 94 89 92  Resp: 17 16 16 18  $ Temp: 98.1 F (36.7 C) 98.1 F (36.7 C) 98.3 F (36.8 C) 98.6 F (37 C)  TempSrc: Oral Oral Oral Oral  SpO2: 98% 98% 99% 100%  Weight:      Height:  Intake/Output Summary (Last 24 hours) at 10/25/2022 1221 Last data filed at 10/25/2022 1000 Gross per 24 hour  Intake 480 ml  Output 600 ml  Net -120 ml   Filed Weights   10/19/22 0500 10/20/22 0500 10/24/22 0500  Weight: 61 kg 61.1 kg 60.1 kg    Exam:     General: Appearance:    Chronically ill appearing male in no acute distress     Lungs:     respirations unlabored  Heart:    Normal heart rate.   MS:    Below knee amputation of left lower extremity is noted.  Neurologic:   Awake, alert        Data Reviewed: CBC: Recent Labs  Lab 10/19/22 0357 10/20/22 0428 10/22/22 0319 10/24/22 0337  WBC 15.6* 13.2* 13.8* 8.6  HGB 10.8* 10.7* 9.8* 9.5*  HCT 31.2* 32.5* 29.2* 27.8*  MCV 93.7 96.4 94.8 93.9  PLT 398 395 417* 123456*   Basic Metabolic Panel: Recent Labs  Lab 10/19/22 0357 10/20/22 0428 10/22/22 0319 10/24/22 0337  NA 130* 131* 128* 130*  K 3.6 3.9 4.0 4.4  CL 91* 93* 90* 93*  CO2 32 28 29 26  $ GLUCOSE 98 104* 107* 113*  BUN <5* 6* 9 10  CREATININE 0.50* 0.48* 0.48* 0.48*  CALCIUM 9.0 8.9 8.8* 8.9  MG 1.7  --   --   --   PHOS 3.4  --   --   --    GFR: Estimated Creatinine Clearance: 78.3 mL/min (A) (by C-G formula based on SCr of 0.48 mg/dL (L)). Liver Function Tests: No results for input(s): "AST", "ALT", "ALKPHOS", "BILITOT", "PROT", "ALBUMIN" in the last 168 hours.  No results for input(s): "LIPASE", "AMYLASE" in the last 168 hours. No results for input(s): "AMMONIA" in the last 168 hours. Coagulation Profile: No results for input(s): "INR", "PROTIME" in the last 168 hours. Cardiac Enzymes: No results for input(s): "CKTOTAL", "CKMB", "CKMBINDEX", "TROPONINI" in the last 168 hours. BNP (last 3 results) No results for input(s): "PROBNP" in the last 8760 hours. HbA1C: No results for input(s): "HGBA1C" in the last 72 hours. CBG: Recent Labs  Lab 10/24/22 0827 10/24/22 1139 10/24/22 1632 10/24/22 2132 10/25/22 1135  GLUCAP 110* 103* 104* 126* 108*   Lipid Profile: No results for input(s): "CHOL", "HDL", "LDLCALC", "TRIG", "CHOLHDL", "LDLDIRECT" in the last 72 hours.  Thyroid Function Tests: No results for input(s): "TSH", "T4TOTAL", "FREET4", "T3FREE", "THYROIDAB" in the last 72 hours. Anemia Panel: No results for input(s): "VITAMINB12", "FOLATE", "FERRITIN", "TIBC", "IRON", "RETICCTPCT" in the last 72 hours. Urine analysis:    Component Value Date/Time    COLORURINE STRAW (A) 09/20/2022 0229   APPEARANCEUR CLEAR 09/20/2022 0229   LABSPEC 1.003 (L) 09/20/2022 0229   PHURINE 7.0 09/20/2022 0229   GLUCOSEU NEGATIVE 09/20/2022 0229   HGBUR NEGATIVE 09/20/2022 0229   BILIRUBINUR NEGATIVE 09/20/2022 0229   KETONESUR NEGATIVE 09/20/2022 0229   PROTEINUR NEGATIVE 09/20/2022 0229   NITRITE NEGATIVE 09/20/2022 0229   LEUKOCYTESUR NEGATIVE 09/20/2022 0229    Recent Results (from the past 240 hour(s))  Culture, Respiratory w Gram Stain (tracheal aspirate)     Status: None   Collection Time: 10/16/22  9:59 AM   Specimen: Tracheal Aspirate; Respiratory  Result Value Ref Range Status   Specimen Description TRACHEAL ASPIRATE  Final   Special Requests NONE  Final   Gram Stain   Final    FEW WBC PRESENT, PREDOMINANTLY PMN NO ORGANISMS SEEN Performed at [facility],  [address], [place], [place] [address]    Culture RARE PROTEUS MIRABILIS  Final   Report Status 10/19/2022 FINAL  Final   Organism ID, Bacteria PROTEUS MIRABILIS  Final      Susceptibility   Proteus mirabilis - MIC*    AMPICILLIN <=2 SENSITIVE Sensitive     CEFAZOLIN <=4 SENSITIVE Sensitive     CEFEPIME <=0.12 SENSITIVE Sensitive     CEFTAZIDIME <=1 SENSITIVE Sensitive     CEFTRIAXONE <=0.25 SENSITIVE Sensitive     CIPROFLOXACIN <=0.25 SENSITIVE Sensitive     GENTAMICIN <=1 SENSITIVE Sensitive     IMIPENEM 8 INTERMEDIATE Intermediate     TRIMETH/SULFA <=20 SENSITIVE Sensitive     AMPICILLIN/SULBACTAM <=2 SENSITIVE Sensitive     PIP/TAZO <=4 SENSITIVE Sensitive     * RARE PROTEUS MIRABILIS      Studies: No results found.  Scheduled Meds:  amoxicillin-clavulanate  1 tablet Oral Q12H   docusate  100 mg Per Tube BID   feeding supplement (NEPRO CARB STEADY)  237 mL Oral Q24H   fluticasone furoate-vilanterol  1 puff Inhalation Daily   folic acid  1 mg Per Tube Daily   heparin  5,000 Units Subcutaneous Q8H   insulin aspart  0-6 Units Subcutaneous TID AC & HS    multivitamin with minerals  1 tablet Oral Daily   pantoprazole  40 mg Oral Daily   polyethylene glycol  17 g Per Tube Daily   thiamine  100 mg Per Tube Daily    Continuous Infusions:     LOS: 10 days     Geradine Girt, DO [facility]   If 7PM-7AM, please contact night-coverage [email or web address] Password [facility] 10/25/2022, 12:21 PM

## 2022-10-25 NOTE — Progress Notes (Signed)
Palliative:  Chart reviewed. Goals are clear. No further palliative needs. Will sign off. Discussed with Dr. Eliseo Squires.  No charge  Vinie Sill, NP Palliative Medicine Team Pager [phone] (Please see [email or web address] for schedule) Team Phone [phone]

## 2022-10-25 NOTE — Progress Notes (Signed)
Mobility Specialist Progress Note    10/25/22 1533  Mobility  Activity  (wheelchair mobility)  Level of Assistance Moderate assist, patient does 50-74%  Assistive Device Wheelchair  RLE Weight Bearing NWB  Activity Response Tolerated well  Mobility Referral Yes  $Mobility charge 1 Mobility   Pt received in bed and agreeable to practice AP scoot. Pt stated his R arm hurts too much to propel WC. MinA for posterior transfer. ModA for anterior scoot. Left in bed with call bell in reach.   Hildred Alamin Mobility Specialist  Please Psychologist, sport and exercise or Rehab Office at [phone]

## 2022-10-26 DIAGNOSIS — J1569 Pneumonia due to other gram-negative bacteria: Secondary | ICD-10-CM | POA: Diagnosis not present

## 2022-10-26 DIAGNOSIS — F1010 Alcohol abuse, uncomplicated: Secondary | ICD-10-CM | POA: Diagnosis not present

## 2022-10-26 DIAGNOSIS — J9601 Acute respiratory failure with hypoxia: Secondary | ICD-10-CM | POA: Diagnosis not present

## 2022-10-26 DIAGNOSIS — E871 Hypo-osmolality and hyponatremia: Secondary | ICD-10-CM | POA: Diagnosis not present

## 2022-10-26 LAB — BASIC METABOLIC PANEL
Anion gap: 9 (ref 5–15)
BUN: 11 mg/dL (ref 8–23)
CO2: 28 mmol/L (ref 22–32)
Calcium: 9.1 mg/dL (ref 8.9–10.3)
Chloride: 91 mmol/L — ABNORMAL LOW (ref 98–111)
Creatinine, Ser: 0.53 mg/dL — ABNORMAL LOW (ref 0.61–1.24)
GFR, Estimated: >60 mL/min (ref >60)
Glucose, Bld: 118 mg/dL — ABNORMAL HIGH (ref 70–99)
Potassium: 4.4 mmol/L (ref 3.5–5.1)
Sodium: 128 mmol/L — ABNORMAL LOW (ref 135–145)

## 2022-10-26 LAB — GLUCOSE, CAPILLARY
Glucose-Capillary: 109 mg/dL — ABNORMAL HIGH (ref 70–99)
Glucose-Capillary: 109 mg/dL — ABNORMAL HIGH (ref 70–99)
Glucose-Capillary: 124 mg/dL — ABNORMAL HIGH (ref 70–99)
Glucose-Capillary: 125 mg/dL — ABNORMAL HIGH (ref 70–99)

## 2022-10-26 LAB — CBC
HCT: 28.2 % — ABNORMAL LOW (ref 39.0–52.0)
Hemoglobin: 9.7 g/dL — ABNORMAL LOW (ref 13.0–17.0)
MCH: 32.1 pg (ref 26.0–34.0)
MCHC: 34.4 g/dL (ref 30.0–36.0)
MCV: 93.4 fL (ref 80.0–100.0)
Platelets: 595 10*3/uL — ABNORMAL HIGH (ref 150–400)
RBC: 3.02 MIL/uL — ABNORMAL LOW (ref 4.22–5.81)
RDW: 16.9 % — ABNORMAL HIGH (ref 11.5–15.5)
WBC: 9.1 10*3/uL (ref 4.0–10.5)
nRBC: 0 % (ref 0.0–0.2)

## 2022-10-26 NOTE — Plan of Care (Signed)
  Problem: Education: Goal: Ability to describe self-care measures that may prevent or decrease complications (Diabetes Survival Skills Education) will improve Outcome: Progressing   Problem: Coping: Goal: Ability to adjust to condition or change in health will improve Outcome: Progressing   Problem: Fluid Volume: Goal: Ability to maintain a balanced intake and output will improve Outcome: Progressing   Problem: Health Behavior/Discharge Planning: Goal: Ability to identify and utilize available resources and services will improve Outcome: Progressing Goal: Ability to manage health-related needs will improve Outcome: Progressing   Problem: Metabolic: Goal: Ability to maintain appropriate glucose levels will improve Outcome: Progressing   Problem: Nutritional: Goal: Maintenance of adequate nutrition will improve Outcome: Progressing Goal: Progress toward achieving an optimal weight will improve Outcome: Progressing   Problem: Skin Integrity: Goal: Risk for impaired skin integrity will decrease Outcome: Progressing   Problem: Tissue Perfusion: Goal: Adequacy of tissue perfusion will improve Outcome: Progressing   Problem: Education: Goal: Knowledge of General Education information will improve Description: Including pain rating scale, medication(s)/side effects and non-pharmacologic comfort measures Outcome: Progressing   Problem: Health Behavior/Discharge Planning: Goal: Ability to manage health-related needs will improve Outcome: Progressing   Problem: Clinical Measurements: Goal: Ability to maintain clinical measurements within normal limits will improve Outcome: Progressing Goal: Will remain free from infection Outcome: Progressing Goal: Diagnostic test results will improve Outcome: Progressing Goal: Respiratory complications will improve Outcome: Progressing Goal: Cardiovascular complication will be avoided Outcome: Progressing   Problem: Activity: Goal:  Risk for activity intolerance will decrease Outcome: Progressing   Problem: Nutrition: Goal: Adequate nutrition will be maintained Outcome: Progressing   Problem: Coping: Goal: Level of anxiety will decrease Outcome: Progressing   Problem: Elimination: Goal: Will not experience complications related to bowel motility Outcome: Progressing Goal: Will not experience complications related to urinary retention Outcome: Progressing   Problem: Pain Managment: Goal: General experience of comfort will improve Outcome: Progressing   Problem: Safety: Goal: Ability to remain free from injury will improve Outcome: Progressing   Problem: Skin Integrity: Goal: Risk for impaired skin integrity will decrease Outcome: Progressing   Problem: Safety: Goal: Non-violent Restraint(s) Outcome: Progressing   

## 2022-10-26 NOTE — Progress Notes (Signed)
Mobility Specialist Progress Note    10/26/22 1503  Mobility  Activity  (wheelchair mobility)  Level of Assistance Moderate assist, patient does 50-74%  Assistive Device Wheelchair  Distance Ambulated (ft) 50 ft  RLE Weight Bearing NWB  Activity Response Tolerated fair  Mobility Referral Yes  $Mobility charge 1 Mobility   Pt received and agreeable. Pt attempting to stand and pivot to WC. Required reminders of WB status. ModA for AP transfer. Pt required max encouragement to attempt propelling himself c/o R arm pain. Upon return to room, pt able to scoot onto EOB w/o assistance then requiring minA d/t feeling like he was falling backwards. Left in bed with call bell in reach.   Hildred Alamin Mobility Specialist  Please Psychologist, sport and exercise or Rehab Office at [phone]

## 2022-10-26 NOTE — Progress Notes (Signed)
PROGRESS NOTE  [name] DOB: 1955-11-18 DOA: 10/15/2022 PCP: Demetrios Isaacs, FNP  HPI/Recap of past 72 hours: 67 year old male who is known polysubstance abuse, EtOH abuse cocaine abuse, noncompliance, unstageable pressure injury to right heel, peripheral vascular disease, who presents from home via EMS with altered mental status along with noncompliance. Recently admitted and discharged with concern that he needed to go to a skilled nursing facility for which he refused. He now presents with a sodium of 125, altered mental status, hypoxic and hypercarbic, and noted to be aspirating while on noninvasive mechanical ventilatory support requiring intubation.  Admitted to ICU.  EEG was negative for seizures.  Was started on IV antibiotics due to concern for aspiration.  Extubated on 10/16/2022.  Transferred to [facility] service on 10/18/2022. Currently agreeable to SNF but now issue with finding SNF that will accept him.   Assessment/Plan: Principal Problem:   Respiratory failure (HCC) Active Problems:   Alcohol abuse  Acute hypercarbic/hypoxic respiratory failure status postextubation 10/16/2022.   History of COPD and chronic alcohol abuse  Currently on Unasyn due to concern for aspiration.   - switch to Augmentin on 10/20/2022 to complete 7 days of antibiotics.   Continue DuoNebs  Continue incentive spirometer, pulmonary hygiene.  -dys diet)- reconsult SLP- liquids thinned  Proteus mirabilis pneumonia, POA Proteus mirabilis grew from tracheal aspirate Sensitive to Unasyn, continue   Acute on Chronic Hyponatremia in the setting of beer potomania  On admission serum sodium 112 -between 128-130-- ? baseline   Dysphagia  -advanced to thin liquids   Resolved post repletion: Hypomagnesia.  Resolved post repletion: Hypokalemia.   Prediabetes, blood sugars are well-controlled A1c 5.6 on 09/02/2022.   Chronic EtOH abuse with concern for withdrawal Cocaine Abuse  UDS positive  for cocaine on 09/20/2022. Continue folic acid supplement, thiamine supplement, multivitamins, out of window for alcohol withdrawal.   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 -unfortunately patient is not interested in following the advice of keeping heel off bed/NWB  Right elbow swelling- -? Cellulitis vs thrombophlebitis  -no fracture -resolved   Agreeable to SNF       Labs   CBC: Last Labs          Recent Labs  Lab 10/15/22 0636 10/15/22 [id number] 10/15/22 0903 10/15/22 1148 10/16/22 0718 10/17/22 0602  WBC 15.8*  --   --   --  14.7* 13.8*  HGB 11.2* 12.6* 11.2* 11.9* 8.8* 9.0*  HCT 31.1* 37.0* 33.0* 35.0* 25.7* 26.7*  MCV 88.9  --   --   --  91.8 94.7  PLT 423*  --   --   --  418* 384        Code Status: Full code  Family Communication: called sister 2/9  Disposition Plan:  agreeable to SNF- await placement   Consultants: PCCM. SLP     Subjective: No overnight events-- moving right elbow well Allowed dressing changes this AM  Objective: Vitals:   10/26/22 0500 10/26/22 0603 10/26/22 0740 10/26/22 0755  BP:  (!) 157/81 (!) 134/92   Pulse:  83 91   Resp:  16 18   Temp:  97.7 F (36.5 C) 98 F (36.7 C)   TempSrc:  Oral Oral   SpO2:  98% 96% 91%  Weight: 62.5 kg     Height:  Intake/Output Summary (Last 24 hours) at 10/26/2022 0840 Last data filed at 10/26/2022 0507 Gross per 24 hour  Intake 240 ml  Output 2300 ml  Net -2060 ml   Filed Weights   10/20/22 0500 10/24/22 0500 10/26/22 0500  Weight: 61.1 kg 60.1 kg 62.5 kg    Exam:    General: Appearance:    Chronically ill appearing male in no acute distress     Lungs:      respirations unlabored  Heart:    Normal heart rate. Normal rhythm. No murmurs, rubs, or gallops.   MS:   Below knee amputation of left  lower extremity is noted.   Neurologic:   Awake, alert     Data Reviewed: CBC: Recent Labs  Lab 10/20/22 0428 10/22/22 0319 10/24/22 0337 10/26/22 0251  WBC 13.2* 13.8* 8.6 9.1  HGB 10.7* 9.8* 9.5* 9.7*  HCT 32.5* 29.2* 27.8* 28.2*  MCV 96.4 94.8 93.9 93.4  PLT 395 417* 460* [id number]*   Basic Metabolic Panel: Recent Labs  Lab 10/20/22 0428 10/22/22 0319 10/24/22 0337 10/26/22 0251  NA 131* 128* 130* 128*  K 3.9 4.0 4.4 4.4  CL 93* 90* 93* 91*  CO2 28 29 26 28  $ GLUCOSE 104* 107* 113* 118*  BUN 6* 9 10 11  $ CREATININE 0.48* 0.48* 0.48* 0.53*  CALCIUM 8.9 8.8* 8.9 9.1   GFR: Estimated Creatinine Clearance: 81.4 mL/min (A) (by C-G formula based on SCr of 0.53 mg/dL (L)). Liver Function Tests: No results for input(s): "AST", "ALT", "ALKPHOS", "BILITOT", "PROT", "ALBUMIN" in the last 168 hours.  No results for input(s): "LIPASE", "AMYLASE" in the last 168 hours. No results for input(s): "AMMONIA" in the last 168 hours. Coagulation Profile: No results for input(s): "INR", "PROTIME" in the last 168 hours. Cardiac Enzymes: No results for input(s): "CKTOTAL", "CKMB", "CKMBINDEX", "TROPONINI" in the last 168 hours. BNP (last 3 results) No results for input(s): "PROBNP" in the last 8760 hours. HbA1C: No results for input(s): "HGBA1C" in the last 72 hours. CBG: Recent Labs  Lab 10/24/22 2132 10/25/22 1135 10/25/22 1628 10/25/22 2115 10/26/22 0733  GLUCAP 126* 108* 113* 114* 109*   Lipid Profile: No results for input(s): "CHOL", "HDL", "LDLCALC", "TRIG", "CHOLHDL", "LDLDIRECT" in the last 72 hours.  Thyroid Function Tests: No results for input(s): "TSH", "T4TOTAL", "FREET4", "T3FREE", "THYROIDAB" in the last 72 hours. Anemia Panel: No results for input(s): "VITAMINB12", "FOLATE", "FERRITIN", "TIBC", "IRON", "RETICCTPCT" in the last 72 hours. Urine analysis:    Component Value Date/Time   COLORURINE STRAW (A) 09/20/2022 0229   APPEARANCEUR CLEAR 09/20/2022 0229    LABSPEC 1.003 (L) 09/20/2022 0229   PHURINE 7.0 09/20/2022 0229   GLUCOSEU NEGATIVE 09/20/2022 0229   HGBUR NEGATIVE 09/20/2022 0229   BILIRUBINUR NEGATIVE 09/20/2022 0229   KETONESUR NEGATIVE 09/20/2022 0229   PROTEINUR NEGATIVE 09/20/2022 0229   NITRITE NEGATIVE 09/20/2022 0229   LEUKOCYTESUR NEGATIVE 09/20/2022 0229    Recent Results (from the past 240 hour(s))  Culture, Respiratory w Gram Stain (tracheal aspirate)     Status: None   Collection Time: 10/16/22  9:59 AM   Specimen: Tracheal Aspirate; Respiratory  Result Value Ref Range Status   Specimen Description TRACHEAL ASPIRATE  Final   Special Requests NONE  Final   Gram Stain   Final    FEW WBC PRESENT, PREDOMINANTLY PMN NO ORGANISMS SEEN Performed at [facility], [address], [place], [place] [address]    Culture RARE PROTEUS MIRABILIS  Final   Report  Status 10/19/2022 FINAL  Final   Organism ID, Bacteria PROTEUS MIRABILIS  Final      Susceptibility   Proteus mirabilis - MIC*    AMPICILLIN <=2 SENSITIVE Sensitive     CEFAZOLIN <=4 SENSITIVE Sensitive     CEFEPIME <=0.12 SENSITIVE Sensitive     CEFTAZIDIME <=1 SENSITIVE Sensitive     CEFTRIAXONE <=0.25 SENSITIVE Sensitive     CIPROFLOXACIN <=0.25 SENSITIVE Sensitive     GENTAMICIN <=1 SENSITIVE Sensitive     IMIPENEM 8 INTERMEDIATE Intermediate     TRIMETH/SULFA <=20 SENSITIVE Sensitive     AMPICILLIN/SULBACTAM <=2 SENSITIVE Sensitive     PIP/TAZO <=4 SENSITIVE Sensitive     * RARE PROTEUS MIRABILIS      Studies: No results found.  Scheduled Meds:  amoxicillin-clavulanate  1 tablet Oral Q12H   docusate  100 mg Per Tube BID   feeding supplement (NEPRO CARB STEADY)  237 mL Oral Q24H   fluticasone furoate-vilanterol  1 puff Inhalation Daily   folic acid  1 mg Per Tube Daily   heparin  5,000 Units Subcutaneous Q8H   insulin aspart  0-6 Units Subcutaneous TID AC & HS   multivitamin with minerals  1 tablet Oral Daily   pantoprazole  40 mg Oral Daily    polyethylene glycol  17 g Per Tube Daily   thiamine  100 mg Per Tube Daily    Continuous Infusions:     LOS: 11 days     Geradine Girt, DO [facility]   If 7PM-7AM, please contact night-coverage [email or web address] Password [facility] 10/26/2022, 8:40 AM

## 2022-10-27 DIAGNOSIS — J9602 Acute respiratory failure with hypercapnia: Secondary | ICD-10-CM | POA: Diagnosis not present

## 2022-10-27 DIAGNOSIS — J1569 Pneumonia due to other gram-negative bacteria: Secondary | ICD-10-CM | POA: Diagnosis not present

## 2022-10-27 DIAGNOSIS — J9601 Acute respiratory failure with hypoxia: Secondary | ICD-10-CM | POA: Diagnosis not present

## 2022-10-27 LAB — GLUCOSE, CAPILLARY
Glucose-Capillary: 148 mg/dL — ABNORMAL HIGH (ref 70–99)
Glucose-Capillary: 87 mg/dL (ref 70–99)
Glucose-Capillary: 107 mg/dL — ABNORMAL HIGH (ref 70–99)
Glucose-Capillary: 124 mg/dL — ABNORMAL HIGH (ref 70–99)

## 2022-10-27 LAB — BASIC METABOLIC PANEL
Anion gap: 9 (ref 5–15)
BUN: 9 mg/dL (ref 8–23)
CO2: 26 mmol/L (ref 22–32)
Calcium: 9.2 mg/dL (ref 8.9–10.3)
Chloride: 92 mmol/L — ABNORMAL LOW (ref 98–111)
Creatinine, Ser: 0.5 mg/dL — ABNORMAL LOW (ref 0.61–1.24)
GFR, Estimated: >60 mL/min (ref >60)
Glucose, Bld: 102 mg/dL — ABNORMAL HIGH (ref 70–99)
Potassium: 4.3 mmol/L (ref 3.5–5.1)
Sodium: 127 mmol/L — ABNORMAL LOW (ref 135–145)

## 2022-10-27 LAB — CBC
HCT: 27.9 % — ABNORMAL LOW (ref 39.0–52.0)
Hemoglobin: 9.4 g/dL — ABNORMAL LOW (ref 13.0–17.0)
MCH: 31.9 pg (ref 26.0–34.0)
MCHC: 33.7 g/dL (ref 30.0–36.0)
MCV: 94.6 fL (ref 80.0–100.0)
Platelets: 521 10*3/uL — ABNORMAL HIGH (ref 150–400)
RBC: 2.95 MIL/uL — ABNORMAL LOW (ref 4.22–5.81)
RDW: 16.7 % — ABNORMAL HIGH (ref 11.5–15.5)
WBC: 8.4 10*3/uL (ref 4.0–10.5)
nRBC: 0 % (ref 0.0–0.2)

## 2022-10-27 MED ORDER — AMOXICILLIN-POT CLAVULANATE 875-125 MG PO TABS
1.0000 | ORAL_TABLET | Freq: Two times a day (BID) | ORAL | Status: AC
  Administered 2022-10-27 (×2): 1 via ORAL
  Filled 2022-10-27 (×2): qty 1

## 2022-10-27 NOTE — Progress Notes (Signed)
PROGRESS NOTE  [name] PX:[id number] DOB: [birth year] DOA: 10/15/2022 PCP: Demetrios Isaacs, FNP   LOS: 12 days   Brief Narrative / Interim history: 67 year old male with with prior polysubstance abuse, EtOH, cocaine, noncompliance, right heel unstageable pressure injury, PVD who comes into the hospital with altered mental status.  He was recently admitted and discharged home as he refused SNF, and came back to the hospital confused, hypoxic, hypercarbic and noted to be aspirating.  He was initially required to the ICU requiring intubation.  Eventually extubated on 2/3 and transferred to [facility] on 2/5.  Currently stable, awaiting SNF  Subjective / 24h Interval events: He is doing well this morning, denies any shortness of breath, denies any chest pain  Assesement and Plan: Principal Problem:   Respiratory failure ([place]) Active Problems:   Alcohol abuse  Principal problem Acute hypoxic and hypercarbic respiratory failure -initially requiring intubation, eventually extubated and now he is stable on room air.  Active problems Proteus mirabilis pneumonia -finished Augmentin today, status post 8 days  Acute on Chronic Hyponatremia in the setting of beer potomania - On admission serum sodium 112, currently in the upper 120s which I suspect is his baseline   Dysphagia  -advanced to thin liquids   Hypokalemia, hypomagnesemia -continue to monitor and replete as indicated  Prediabetes, blood sugars are well-controlled - A1c 5.6 on 09/02/2022.   Chronic EtOH abuse with concern for withdrawal, cocaine abuse - UDS positive for cocaine on 09/20/2022. Continue folic acid supplement, thiamine supplement, multivitamins, out of window for alcohol withdrawal.   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 - Continue home regimen   Unstageable right heel pressure injury, POA - Continue local wound care  -unfortunately patient is not interested in following the advice of keeping heel off  bed/NWB   Right elbow swelling- Cellulitis vs thrombophlebitis  -no fracture, resolved    Scheduled Meds:  amoxicillin-clavulanate  1 tablet Oral Q12H   docusate  100 mg Per Tube BID   feeding supplement (NEPRO CARB STEADY)  237 mL Oral Q24H   fluticasone furoate-vilanterol  1 puff Inhalation Daily   folic acid  1 mg Per Tube Daily   heparin  5,000 Units Subcutaneous Q8H   insulin aspart  0-6 Units Subcutaneous TID AC & HS   multivitamin with minerals  1 tablet Oral Daily   pantoprazole  40 mg Oral Daily   polyethylene glycol  17 g Per Tube Daily   thiamine  100 mg Per Tube Daily   Continuous Infusions: PRN Meds:.albuterol, HYDROcodone-acetaminophen, mouth rinse, polyethylene glycol  Current Outpatient Medications  Medication Instructions   albuterol (VENTOLIN HFA) 108 (90 Base) MCG/ACT inhaler 2 puffs, Inhalation, Every 6 hours PRN   amLODipine (NORVASC) 10 mg, Oral, Daily   fluticasone furoate-vilanterol (BREO ELLIPTA) 100-25 MCG/ACT AEPB 1 puff, Inhalation, Daily   folic acid (FOLVITE) 1 mg, Oral, Daily   guaiFENesin-dextromethorphan (ROBITUSSIN DM) 100-10 MG/5ML syrup 10 mLs, Oral, Every 4 hours PRN   lisinopril (ZESTRIL) 10 mg, Oral, Daily   Multiple Vitamin (MULTIVITAMIN WITH MINERALS) TABS tablet 1 tablet, Oral, Daily   pantoprazole (PROTONIX) 40 mg, Oral, Daily   thiamine (VITAMIN B1) 100 mg, Oral, Daily    Diet Orders (From admission, onward)     Start     Ordered   10/23/22 1420  DIET DYS 3 Room service appropriate? No; Fluid consistency: Thin  Diet effective now       Question Answer Comment  Room service appropriate? No  Fluid consistency: Thin      10/23/22 1419            DVT prophylaxis: heparin injection 5,000 Units Start: 10/15/22 0930   Lab Results  Component Value Date   PLT 521 (H) 10/27/2022      Code Status: Full Code  Family Communication: no family at bedside   Status is: Inpatient  Remains inpatient appropriate because:  awaiting placement.  Level of care: Med-Surg  Consultants:  PCCM  Objective: Vitals:   10/26/22 1942 10/27/22 0440 10/27/22 0741 10/27/22 0838  BP: 137/87 120/64 109/69   Pulse: 89 86 (!) 105 100  Resp: 19 17 18 18  $ Temp: 98.1 F (36.7 C) 98.4 F (36.9 C) 98.3 F (36.8 C)   TempSrc: Oral Oral Oral   SpO2: 97% 97% (!) 35% 97%  Weight:      Height:        Intake/Output Summary (Last 24 hours) at 10/27/2022 1015 Last data filed at 10/27/2022 0442 Gross per 24 hour  Intake 240 ml  Output 4200 ml  Net -3960 ml   Wt Readings from Last 3 Encounters:  10/26/22 62.5 kg  09/23/22 58.8 kg  09/17/22 56.8 kg    Examination:  Constitutional: NAD Eyes: no scleral icterus ENMT: Mucous membranes are moist.  Neck: normal, supple Respiratory: clear to auscultation bilaterally, no wheezing, no crackles. Normal respiratory effort. No accessory muscle use.  Cardiovascular: Regular rate and rhythm, no murmurs / rubs / gallops. No LE edema.  Abdomen: non distended, no tenderness. Bowel sounds positive.  Musculoskeletal: no clubbing / cyanosis.   Data Reviewed: I have independently reviewed following labs and imaging studies   CBC Recent Labs  Lab 10/22/22 0319 10/24/22 0337 10/26/22 0251 10/27/22 0313  WBC 13.8* 8.6 9.1 8.4  HGB 9.8* 9.5* 9.7* 9.4*  HCT 29.2* 27.8* 28.2* 27.9*  PLT 417* 460* 595* 521*  MCV 94.8 93.9 93.4 94.6  MCH 31.8 32.1 32.1 31.9  MCHC 33.6 34.2 34.4 33.7  RDW 17.4* 17.1* 16.9* 16.7*    Recent Labs  Lab 10/22/22 0319 10/24/22 0337 10/26/22 0251 10/27/22 0313  NA 128* 130* 128* 127*  K 4.0 4.4 4.4 4.3  CL 90* 93* 91* 92*  CO2 29 26 28 26  $ GLUCOSE 107* 113* 118* 102*  BUN 9 10 11 9  $ CREATININE 0.48* 0.48* 0.53* 0.50*  CALCIUM 8.8* 8.9 9.1 9.2    ------------------------------------------------------------------------------------------------------------------ No results for input(s): "CHOL", "HDL", "LDLCALC", "TRIG", "CHOLHDL", "LDLDIRECT" in  the last 72 hours.  Lab Results  Component Value Date   HGBA1C 5.6 09/02/2022   ------------------------------------------------------------------------------------------------------------------ No results for input(s): "TSH", "T4TOTAL", "T3FREE", "THYROIDAB" in the last 72 hours.  Invalid input(s): "FREET3"  Cardiac Enzymes No results for input(s): "CKMB", "TROPONINI", "MYOGLOBIN" in the last 168 hours.  Invalid input(s): "CK" ------------------------------------------------------------------------------------------------------------------    Component Value Date/Time   BNP 71.0 10/15/2022 0636    CBG: Recent Labs  Lab 10/26/22 0733 10/26/22 1108 10/26/22 1608 10/26/22 1941 10/27/22 0738  GLUCAP 109* 109* 124* 125* 148*    No results found for this or any previous visit (from the past 240 hour(s)).   Radiology Studies: No results found.   Marzetta Board, MD, PhD [facility]  Between 7 am - 7 pm I am available, please contact me via Amion (for emergencies) or Securechat (non urgent messages)  Between 7 pm - 7 am I am not available, please contact night coverage MD/APP via Amion

## 2022-10-28 DIAGNOSIS — J9601 Acute respiratory failure with hypoxia: Secondary | ICD-10-CM | POA: Diagnosis not present

## 2022-10-28 DIAGNOSIS — J9602 Acute respiratory failure with hypercapnia: Secondary | ICD-10-CM | POA: Diagnosis not present

## 2022-10-28 LAB — GLUCOSE, CAPILLARY
Glucose-Capillary: 103 mg/dL — ABNORMAL HIGH (ref 70–99)
Glucose-Capillary: 114 mg/dL — ABNORMAL HIGH (ref 70–99)

## 2022-10-28 NOTE — Progress Notes (Signed)
PROGRESS NOTE  Ronald [name] ZI:[id number] DOB: [birth year] DOA: 10/15/2022 PCP: Demetrios Isaacs, FNP   LOS: 13 days   Brief Narrative / Interim history: 67 year old male with with prior polysubstance abuse, EtOH, cocaine, noncompliance, right heel unstageable pressure injury, PVD who comes into the hospital with altered mental status.  He was recently admitted and discharged home as he refused SNF, and came back to the hospital confused, hypoxic, hypercarbic and noted to be aspirating.  He was initially required to the ICU requiring intubation.  Eventually extubated on 2/3 and transferred to [facility] on 2/5.  Currently stable, awaiting SNF  Subjective / 24h Interval events: No events, doing well today  Assesement and Plan: Principal Problem:   Respiratory failure ([place]) Active Problems:   Alcohol abuse  Principal problem Acute hypoxic and hypercarbic respiratory failure -initially requiring intubation, eventually extubated and now he is stable on room air.  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   Chronic EtOH abuse with concern for withdrawal, cocaine abuse - UDS positive for cocaine on 09/20/2022. Continue folic acid supplement, thiamine supplement, multivitamins, out of window for alcohol withdrawal.   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 - Continue home regimen   Unstageable right heel pressure injury, POA - Continue local wound care  -unfortunately patient is not interested in following the advice of keeping heel off bed/NWB   Right elbow swelling- Cellulitis vs thrombophlebitis  -no  fracture, resolved    Scheduled Meds:  docusate  100 mg Per Tube BID   feeding supplement (NEPRO CARB STEADY)  237 mL Oral Q24H   fluticasone furoate-vilanterol  1 puff Inhalation Daily   folic acid  1 mg Per Tube Daily   heparin  5,000 Units Subcutaneous Q8H   multivitamin with minerals  1 tablet Oral Daily   pantoprazole  40 mg Oral Daily   polyethylene glycol  17 g Per Tube Daily   thiamine  100 mg Per Tube Daily   Continuous Infusions: PRN Meds:.albuterol, HYDROcodone-acetaminophen, mouth rinse, polyethylene glycol  Current Outpatient Medications  Medication Instructions   albuterol (VENTOLIN HFA) 108 (90 Base) MCG/ACT inhaler 2 puffs, Inhalation, Every 6 hours PRN   amLODipine (NORVASC) 10 mg, Oral, Daily   fluticasone furoate-vilanterol (BREO ELLIPTA) 100-25 MCG/ACT AEPB 1 puff, Inhalation, Daily   folic acid (FOLVITE) 1 mg, Oral, Daily   guaiFENesin-dextromethorphan (ROBITUSSIN DM) 100-10 MG/5ML syrup 10 mLs, Oral, Every 4 hours PRN   lisinopril (ZESTRIL) 10 mg, Oral, Daily   Multiple Vitamin (MULTIVITAMIN WITH MINERALS) TABS tablet 1 tablet, Oral, Daily   pantoprazole (PROTONIX) 40 mg, Oral, Daily   thiamine (VITAMIN B1) 100 mg, Oral, Daily    Diet Orders (From admission, onward)     Start     Ordered   10/23/22 1420  DIET DYS 3 Room service appropriate? No; Fluid consistency: Thin  Diet effective now       Question Answer Comment  Room service appropriate? No   Fluid consistency: Thin      10/23/22 1419            DVT prophylaxis: heparin injection 5,000 Units Start: 10/15/22 0930  Lab Results  Component Value Date   PLT 521 (H) 10/27/2022      Code Status: Full Code  Family Communication: no family at bedside   Status is: Inpatient  Remains inpatient appropriate because: awaiting placement.  Level of care: Med-Surg  Consultants:  PCCM  Objective: Vitals:   10/27/22 2224 10/27/22 2233 10/28/22 0600 10/28/22 0801  BP: (!) 155/78 (!)  155/78 139/78 127/69  Pulse: (!) 102  89 94  Resp: 16  18 18  $ Temp: 98.3 F (36.8 C)  98.4 F (36.9 C) 98.1 F (36.7 C)  TempSrc: Oral  Oral Oral  SpO2: 98%  97% 100%  Weight:      Height:        Intake/Output Summary (Last 24 hours) at 10/28/2022 1140 Last data filed at 10/28/2022 0600 Gross per 24 hour  Intake 120 ml  Output 1400 ml  Net -1280 ml    Wt Readings from Last 3 Encounters:  10/26/22 62.5 kg  09/23/22 58.8 kg  09/17/22 56.8 kg    Examination:  Constitutional: NAD Respiratory: CTA Cardiovascular: RRR  Data Reviewed: I have independently reviewed following labs and imaging studies   CBC Recent Labs  Lab 10/22/22 0319 10/24/22 0337 10/26/22 0251 10/27/22 0313  WBC 13.8* 8.6 9.1 8.4  HGB 9.8* 9.5* 9.7* 9.4*  HCT 29.2* 27.8* 28.2* 27.9*  PLT 417* 460* 595* 521*  MCV 94.8 93.9 93.4 94.6  MCH 31.8 32.1 32.1 31.9  MCHC 33.6 34.2 34.4 33.7  RDW 17.4* 17.1* 16.9* 16.7*     Recent Labs  Lab 10/22/22 0319 10/24/22 0337 10/26/22 0251 10/27/22 0313  NA 128* 130* 128* 127*  K 4.0 4.4 4.4 4.3  CL 90* 93* 91* 92*  CO2 29 26 28 26  $ GLUCOSE 107* 113* 118* 102*  BUN 9 10 11 9  $ CREATININE 0.48* 0.48* 0.53* 0.50*  CALCIUM 8.8* 8.9 9.1 9.2     ------------------------------------------------------------------------------------------------------------------ No results for input(s): "CHOL", "HDL", "LDLCALC", "TRIG", "CHOLHDL", "LDLDIRECT" in the last 72 hours.  Lab Results  Component Value Date   HGBA1C 5.6 09/02/2022   ------------------------------------------------------------------------------------------------------------------ No results for input(s): "TSH", "T4TOTAL", "T3FREE", "THYROIDAB" in the last 72 hours.  Invalid input(s): "FREET3"  Cardiac Enzymes No results for input(s): "CKMB", "TROPONINI", "MYOGLOBIN" in the last 168 hours.  Invalid input(s):  "CK" ------------------------------------------------------------------------------------------------------------------    Component Value Date/Time   BNP 71.0 10/15/2022 0636    CBG: Recent Labs  Lab 10/27/22 0738 10/27/22 1130 10/27/22 1610 10/27/22 2011 10/28/22 0801  GLUCAP 148* 87 107* 124* 103*     No results found for this or any previous visit (from the past 240 hour(s)).   Radiology Studies: No results found.   Marzetta Board, MD, PhD [facility]  Between 7 am - 7 pm I am available, please contact me via Amion (for emergencies) or Securechat (non urgent messages)  Between 7 pm - 7 am I am not available, please contact night coverage MD/APP via Amion

## 2022-10-28 NOTE — Progress Notes (Signed)
PT Cancellation Note  Patient Details Name: [name] MRN: [id number] DOB: [birth year]   Cancelled Treatment:    Reason Eval/Treat Not Completed: (P) Patient declined, no reason specified (Pt stated "not today, come back tomorrow" despite encouragement)   Michelle Nasuti 10/28/2022, 2:02 PM

## 2022-10-28 NOTE — Care Management Important Message (Signed)
Important Message  Patient Details  Name: Ronald [name] MRN: [id number] Date of Birth: 1956/04/14   Medicare Important Message Given:  Yes     Hannah Beat 10/28/2022, 2:30 PM

## 2022-10-29 DIAGNOSIS — J1569 Pneumonia due to other gram-negative bacteria: Secondary | ICD-10-CM | POA: Diagnosis not present

## 2022-10-29 DIAGNOSIS — J9602 Acute respiratory failure with hypercapnia: Secondary | ICD-10-CM | POA: Diagnosis not present

## 2022-10-29 DIAGNOSIS — J9601 Acute respiratory failure with hypoxia: Secondary | ICD-10-CM | POA: Diagnosis not present

## 2022-10-29 LAB — GLUCOSE, CAPILLARY: Glucose-Capillary: 101 mg/dL — ABNORMAL HIGH (ref 70–99)

## 2022-10-29 NOTE — Progress Notes (Signed)
Physical Therapy Treatment Patient Details Name: Ronald [name] MRN: [id number] DOB: February 17, 1956 Today's Date: 10/29/2022   History of Present Illness This is a 67 year old gentleman admitted 2/2 after being found down at home after a recent d/c from hospital where he refused SNF. Pt was hyponatremic hypoxemic hypercarbic was placed on BiPAP in the emergency room. Ultimately failed BiPAP requiring intubation mechanical ventilation 2/2--2/3. 2/6: PTA discussed with DO pt R heel ulcer (POA) and pt NWB as of 2/7. PMH: polysubstance abuse, cocaine, alcohol use, R heel pressure injury.    PT Comments    Pt was received in supine and seen with OT to address functional goals. Pt focused on improving A/P transfers to chair and therex to increase strength. Pt tolerated treatment well requiring occasional recovery breaks with intermittent agitation. Pt continued to demonstrate difficulty performing bed mobility and transfers without RLE WB despite cues and physical assist to maintain NWB. Pt stated "I have to" in reference to using RLE. Pt was able to perform exercises with cues for technique. PTA attempted to educate pt on use and benefits of IS, however pt became agitated and refused. Pt continues to benefit from PT services to progress toward functional mobility goals.    Recommendations for follow up therapy are one component of a multi-disciplinary discharge planning process, led by the attending physician.  Recommendations may be updated based on patient status, additional functional criteria and insurance authorization.  Follow Up Recommendations  Skilled nursing-short term rehab (<3 hours/day) Can patient physically be transported by private vehicle: No   Assistance Recommended at Discharge Frequent or constant Supervision/Assistance  Patient can return home with the following Assistance with cooking/housework;Direct supervision/assist for financial management;Direct supervision/assist for  medications management;Assist for transportation;A little help with bathing/dressing/bathroom;A lot of help with walking and/or transfers;Help with stairs or ramp for entrance   Equipment Recommendations  Wheelchair (measurements PT);Wheelchair cushion (measurements PT);Other (comment)    Recommendations for Other Services       Precautions / Restrictions Precautions Precautions: Fall Precaution Comments: 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    Transfers Overall transfer level: Needs assistance Equipment used: None Transfers: Bed to chair/wheelchair/BSC         Anterior-Posterior transfers: Min assist   General transfer comment: Pt requiring min A to raise RLE to prevent WB. Pt continued to pull RLE under him to assist with transfer despite education on NWB status.       Balance Overall balance assessment: Needs assistance Sitting-balance support: Bilateral upper extremity supported, Feet supported Sitting balance-Leahy Scale: Fair                                      Cognition Arousal/Alertness: Awake/alert Behavior During Therapy: WFL for tasks assessed/performed Overall Cognitive Status: No family/caregiver present to determine baseline cognitive functioning  Exercises Total Joint Exercises Quad Sets: AROM, Right, 5 reps, Supine Straight Leg Raises: AROM, Supine, Right, 10 reps    General Comments General comments (skin integrity,  edema, etc.): Pt agreed to a pillow under RLE to elevate heel off of the bed      Pertinent Vitals/Pain Pain Assessment Pain Assessment: Faces Faces Pain Scale: Hurts even more Pain Location: RUE elbow and R heel Pain Descriptors / Indicators: Grimacing, Guarding, Sore Pain Intervention(s): Limited activity within patient's tolerance, Monitored during session, Repositioned     PT Goals (current goals can now be found in the care plan section) Acute Rehab PT Goals Patient Stated Goal: To go home PT Goal Formulation: With patient Time For Goal Achievement: 10/30/22 Potential to Achieve Goals: Fair Progress towards PT goals: Progressing toward goals    Frequency    Min 3X/week      PT Plan Current plan remains appropriate    Co-evaluation PT/OT/SLP Co-Evaluation/Treatment: Yes Reason for Co-Treatment: To address functional/ADL transfers PT goals addressed during session: Mobility/safety with mobility;Strengthening/ROM        AM-PAC PT "6 Clicks" Mobility   Outcome Measure  Help needed turning from your back to your side while in a flat bed without using bedrails?: A Little Help needed moving from lying on your back to sitting on the side of a flat bed without using bedrails?: A Little Help needed moving to and from a bed to a chair (including a wheelchair)?: A Little Help needed standing up from a chair using your arms (e.g., wheelchair or bedside chair)?: Total Help needed to walk in hospital room?: Total Help needed climbing 3-5 steps with a railing? : Total 6 Click Score: 12    End of Session   Activity Tolerance: Patient limited by fatigue;Patient limited by pain Patient left: in bed;with call bell/phone within reach;with bed alarm set Nurse Communication: Mobility status PT Visit Diagnosis: Other abnormalities of gait and mobility (R26.89);Muscle weakness (generalized) (M62.81);History of falling (Z91.81)     Time: [id number] PT Time Calculation (min)  (ACUTE ONLY): 23 min  Charges:  $Therapeutic Activity: 8-22 mins                     Michelle Nasuti, PTA Acute Rehabilitation Services Secure Chat Preferred  [phone]    Michelle Nasuti 10/29/2022, 1:24 PM

## 2022-10-29 NOTE — TOC Progression Note (Signed)
Transition of Care [facility]) - Progression Note    Patient Details  Name: [name] MRN: [id number] Date of Birth: 04-Feb-1956  [facility]) CM/SW Contact  Joanne Chars, LCSW Phone Number: 10/29/2022, 2:04 PM  Clinical Narrative:   Pt continues to have no SNF bed offers.     Expected Discharge Plan: [place] Barriers to Discharge: Continued Medical Work up  Expected Discharge Plan and Services In-house Referral: Clinical Social Work     Living arrangements for the past 2 months: Single Family Home                                       Social Determinants of Health (SDOH) Interventions SDOH Screenings   Food Insecurity: No Food Insecurity (10/17/2022)  Recent Concern: Food Insecurity - Food Insecurity Present (09/17/2022)  Housing: Medium Risk (10/17/2022)  Transportation Needs: Unmet Transportation Needs (10/17/2022)  Utilities: Not At Risk (10/17/2022)  Tobacco Use: High Risk (10/15/2022)    Readmission Risk Interventions     No data to display

## 2022-10-29 NOTE — Progress Notes (Signed)
Occupational Therapy Treatment Patient Details Name: Ronald [name] MRN: [id number] DOB: 1956/03/20 Today's Date: 10/29/2022   History of present illness This is a 67 year old gentleman admitted 2/2 after being found down at home after a recent d/c from hospital where he refused SNF. Pt was hyponatremic hypoxemic hypercarbic was placed on BiPAP in the emergency room. Ultimately failed BiPAP requiring intubation mechanical ventilation 2/2--2/3. 2/6: PTA discussed with DO pt R heel ulcer (POA) and pt NWB as of 2/7. PMH: polysubstance abuse, cocaine, alcohol use, R heel pressure injury.   OT comments  Pt seen in collaboration with PTA to optimize pt confidence and participation in functional tasks. Pt performing one anterior/posterior transfer to/from chair with min A. Pt with poor compliance with RLE NWB precautions despite max education. OT/PTA providing education regarding mobility OOB and importance of upright positioning as able to optimize activity tolerance. Performed BUE and LE exercises to optimize strength during session. Attempted education regarding incentive spirometry, but pt with max refusal to attempt; redirected toward deep breathing and pt attempting deep breathing once. Will continue to encourage. Pt continues to benefit from skilled OT services.    Recommendations for follow up therapy are one component of a multi-disciplinary discharge planning process, led by the attending physician.  Recommendations may be updated based on patient status, additional functional criteria and insurance authorization.    Follow Up Recommendations  Skilled nursing-short term rehab (<3 hours/day)     Assistance Recommended at Discharge Frequent or constant Supervision/Assistance  Patient can return home with the following  A little help with walking and/or transfers;A little help with bathing/dressing/bathroom;Assistance with cooking/housework;Direct supervision/assist for financial management;Direct  supervision/assist for medications management;Help with stairs or ramp for entrance   Equipment Recommendations  None recommended by OT    Recommendations for Other Services      Precautions / Restrictions Precautions Precautions: Fall Precaution Comments: 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    Transfers Overall transfer level: Needs assistance Equipment used: None Transfers: Bed to chair/wheelchair/BSC         Anterior-Posterior transfers: Min assist   General transfer comment: Pt requiring min A to raise RLE to prevent WB. Pt continued to pull RLE under him to assist with transfer despite education on NWB status.     Balance Overall balance assessment: Needs assistance Sitting-balance support: Bilateral upper extremity supported, Feet supported Sitting balance-Leahy Scale: Fair                                     ADL either performed or assessed with clinical judgement   ADL Overall ADL's : Needs assistance/impaired                     Lower Body Dressing: Total assistance Lower Body Dressing Details (indicate cue type and reason): Pt refused to don his own sock Toilet Transfer: Minimal assistance;Anterior/posterior Toilet Transfer Details (indicate cue type and reason):  simulated to/from chair         Functional mobility during ADLs: Minimal assistance      Extremity/Trunk Assessment Upper Extremity Assessment Upper Extremity Assessment: Generalized weakness   Lower Extremity  Assessment Lower Extremity Assessment: Defer to PT evaluation        Vision       Perception     Praxis      Cognition Arousal/Alertness: Awake/alert Behavior During Therapy: WFL for tasks assessed/performed Overall Cognitive Status: No family/caregiver present to determine baseline cognitive functioning Area of Impairment: Following commands, Safety/judgement, Problem solving, Awareness, Memory                     Memory: Decreased short-term memory Following Commands: Follows multi-step commands inconsistently, Follows one step commands with increased time Safety/Judgement: Decreased awareness of safety Awareness: Intellectual, Emergent Problem Solving: Difficulty sequencing, Requires verbal cues General Comments: pt benefits from patient lead session when able. Following commands with increased time.        Exercises Exercises: Other exercises Other Exercises Other Exercises: BUE shoulder flexion 10x, chair push ups x10, composite flexion/extension; deferrs any scapular mobility exercises or exercises involving elbow flexion    Shoulder Instructions       General Comments agreeable to pillow to elevate heel off bed but continues to be adamant not to wear prevalon boot    Pertinent Vitals/ Pain       Pain Assessment Pain Assessment: Faces Faces Pain Scale: Hurts even more Pain Location: RUE elbow and R heel Pain Descriptors / Indicators: Grimacing, Guarding, Sore Pain Intervention(s): Limited activity within patient's tolerance, Monitored during session, Repositioned  Home Living                                          Prior Functioning/Environment              Frequency  Min 2X/week        Progress Toward Goals  OT Goals(current goals can now be found in the care plan section)  Progress towards OT goals: Progressing toward goals  Acute Rehab OT Goals Patient Stated Goal: none stated OT Goal Formulation: With  patient Time For Goal Achievement: 11/03/22 Potential to Achieve Goals: Good ADL Goals Pt Will Perform Lower Body Dressing: with supervision;sitting/lateral leans Pt Will Transfer to Toilet: with min guard assist;anterior/posterior transfer Pt Will Perform Toileting - Clothing Manipulation and hygiene: with supervision;sitting/lateral leans Pt/caregiver will Perform Home Exercise Program: Increased strength;Right Upper extremity;Left upper extremity;Both right and left upper extremity;With written HEP provided Additional ADL Goal #1: Pt will transfer to wheelchair while maintaining WB precautions with min guard A.  Plan Discharge plan remains appropriate    Co-evaluation    PT/OT/SLP Co-Evaluation/Treatment: Yes Reason for Co-Treatment: To address functional/ADL transfers PT goals addressed during session: Mobility/safety with mobility;Strengthening/ROM OT goals addressed during session: ADL's and self-care;Strengthening/ROM      AM-PAC OT "6 Clicks" Daily Activity     Outcome Measure   Help from another person eating meals?: None Help from another person taking care of personal grooming?: A Little Help from another person toileting, which includes using toliet, bedpan, or urinal?: A Little Help from another person bathing (including washing, rinsing, drying)?: A Little Help from another person to put on and taking off regular upper body clothing?: A Little Help from another person to put on and taking off regular  lower body clothing?: A Little 6 Click Score: 19    End of Session    OT Visit Diagnosis: Unsteadiness on feet (R26.81);Other abnormalities of gait and mobility (R26.89);Muscle weakness (generalized) (M62.81)   Activity Tolerance Patient tolerated treatment well   Patient Left in bed;with call bell/phone within reach;with bed alarm set   Nurse Communication Mobility status        Time: 1213 [id number] OT Time Calculation (min): 24 min  Charges: OT General  Charges $OT Visit: 1 Visit OT Treatments $Self Care/Home Management : 8-22 mins  Elder Cyphers, OTR/L [facility] Acute Rehabilitation Office: [phone]   Magnus Ivan 10/29/2022, 1:40 PM

## 2022-10-29 NOTE — Progress Notes (Signed)
PROGRESS NOTE  [name] DOB: 10-15-1955 DOA: 10/15/2022 PCP: Demetrios Isaacs, FNP   LOS: 14 days   Brief Narrative / Interim history: 67 year old male with with prior polysubstance abuse, EtOH, cocaine, noncompliance, right heel unstageable pressure injury, PVD who comes into the hospital with altered mental status.  He was recently admitted and discharged home as he refused SNF, and came back to the hospital confused, hypoxic, hypercarbic and noted to be aspirating.  He was initially required to the ICU requiring intubation.  Eventually extubated on 2/3 and transferred to [facility] on 2/5.  Currently stable, awaiting SNF  Subjective / 24h Interval events: No overnight events, no complaints this morning  Assesement and Plan: Principal Problem:   Respiratory failure ([place]) Active Problems:   Alcohol abuse  Principal problem Acute hypoxic and hypercarbic respiratory failure -initially requiring intubation, eventually extubated and now he is stable on room air.  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   Chronic EtOH abuse with concern for withdrawal, cocaine abuse - UDS positive for cocaine on 09/20/2022. Continue folic acid supplement, thiamine supplement, multivitamins, out of window for alcohol withdrawal.   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 - Continue home regimen   Unstageable right heel pressure injury, POA - Continue local wound care  -unfortunately patient is not interested in following the advice of keeping heel off bed/NWB   Right elbow swelling- Cellulitis vs  thrombophlebitis  -no fracture, resolved    Scheduled Meds:  docusate  100 mg Per Tube BID   feeding supplement (NEPRO CARB STEADY)  237 mL Oral Q24H   fluticasone furoate-vilanterol  1 puff Inhalation Daily   folic acid  1 mg Per Tube Daily   heparin  5,000 Units Subcutaneous Q8H   multivitamin with minerals  1 tablet Oral Daily   pantoprazole  40 mg Oral Daily   polyethylene glycol  17 g Per Tube Daily   thiamine  100 mg Per Tube Daily   Continuous Infusions: PRN Meds:.albuterol, HYDROcodone-acetaminophen, mouth rinse, polyethylene glycol  Current Outpatient Medications  Medication Instructions   albuterol (VENTOLIN HFA) 108 (90 Base) MCG/ACT inhaler 2 puffs, Inhalation, Every 6 hours PRN   amLODipine (NORVASC) 10 mg, Oral, Daily   fluticasone furoate-vilanterol (BREO ELLIPTA) 100-25 MCG/ACT AEPB 1 puff, Inhalation, Daily   folic acid (FOLVITE) 1 mg, Oral, Daily   guaiFENesin-dextromethorphan (ROBITUSSIN DM) 100-10 MG/5ML syrup 10 mLs, Oral, Every 4 hours PRN   lisinopril (ZESTRIL) 10 mg, Oral, Daily   Multiple Vitamin (MULTIVITAMIN WITH MINERALS) TABS tablet 1 tablet, Oral, Daily   pantoprazole (PROTONIX) 40 mg, Oral, Daily   thiamine (VITAMIN B1) 100 mg, Oral, Daily    Diet Orders (From admission, onward)     Start     Ordered   10/23/22 1420  DIET DYS 3 Room service appropriate? No; Fluid consistency: Thin  Diet effective now       Question Answer Comment  Room service appropriate? No   Fluid consistency: Thin      10/23/22 1419            DVT prophylaxis: heparin injection 5,000 Units Start: 10/15/22  0930   Lab Results  Component Value Date   PLT 521 (H) 10/27/2022      Code Status: Full Code  Family Communication: no family at bedside   Status is: Inpatient  Remains inpatient appropriate because: awaiting placement.  Level of care: Med-Surg  Consultants:  PCCM  Objective: Vitals:   10/28/22 0801 10/28/22 1249 10/28/22 2045 10/29/22 0906   BP: 127/69 109/64 (!) 167/93   Pulse: 94 100 (!) 103   Resp: 18 18    Temp: 98.1 F (36.7 C) 98.3 F (36.8 C) 98.9 F (37.2 C)   TempSrc: Oral Oral Oral   SpO2: 100% 98% 99% 97%  Weight:      Height:        Intake/Output Summary (Last 24 hours) at 10/29/2022 1131 Last data filed at 10/29/2022 0900 Gross per 24 hour  Intake 360 ml  Output 800 ml  Net -440 ml    Wt Readings from Last 3 Encounters:  10/26/22 62.5 kg  09/23/22 58.8 kg  09/17/22 56.8 kg    Examination:  Constitutional: NAD Respiratory: CTA Cardiovascular: RRR  Data Reviewed: I have independently reviewed following labs and imaging studies   CBC Recent Labs  Lab 10/24/22 0337 10/26/22 0251 10/27/22 0313  WBC 8.6 9.1 8.4  HGB 9.5* 9.7* 9.4*  HCT 27.8* 28.2* 27.9*  PLT 460* 595* 521*  MCV 93.9 93.4 94.6  MCH 32.1 32.1 31.9  MCHC 34.2 34.4 33.7  RDW 17.1* 16.9* 16.7*     Recent Labs  Lab 10/24/22 0337 10/26/22 0251 10/27/22 0313  NA 130* 128* 127*  K 4.4 4.4 4.3  CL 93* 91* 92*  CO2 26 28 26  $ GLUCOSE 113* 118* 102*  BUN 10 11 9  $ CREATININE 0.48* 0.53* 0.50*  CALCIUM 8.9 9.1 9.2     ------------------------------------------------------------------------------------------------------------------ No results for input(s): "CHOL", "HDL", "LDLCALC", "TRIG", "CHOLHDL", "LDLDIRECT" in the last 72 hours.  Lab Results  Component Value Date   HGBA1C 5.6 09/02/2022   ------------------------------------------------------------------------------------------------------------------ No results for input(s): "TSH", "T4TOTAL", "T3FREE", "THYROIDAB" in the last 72 hours.  Invalid input(s): "FREET3"  Cardiac Enzymes No results for input(s): "CKMB", "TROPONINI", "MYOGLOBIN" in the last 168 hours.  Invalid input(s): "CK" ------------------------------------------------------------------------------------------------------------------    Component Value Date/Time   BNP 71.0 10/15/2022 0636     CBG: Recent Labs  Lab 10/27/22 1130 10/27/22 1610 10/27/22 2011 10/28/22 0801 10/28/22 1557  GLUCAP 87 107* 124* 103* 114*     No results found for this or any previous visit (from the past 240 hour(s)).   Radiology Studies: No results found.   Marzetta Board, MD, PhD [facility]  Between 7 am - 7 pm I am available, please contact me via Amion (for emergencies) or Securechat (non urgent messages)  Between 7 pm - 7 am I am not available, please contact night coverage MD/APP via Amion

## 2022-10-30 DIAGNOSIS — J9601 Acute respiratory failure with hypoxia: Secondary | ICD-10-CM | POA: Diagnosis not present

## 2022-10-30 DIAGNOSIS — J1569 Pneumonia due to other gram-negative bacteria: Secondary | ICD-10-CM | POA: Diagnosis not present

## 2022-10-30 DIAGNOSIS — J9602 Acute respiratory failure with hypercapnia: Secondary | ICD-10-CM | POA: Diagnosis not present

## 2022-10-30 MED ORDER — THIAMINE MONONITRATE 100 MG PO TABS
100.0000 mg | ORAL_TABLET | Freq: Every day | ORAL | Status: DC
  Administered 2022-10-30 – 2022-12-03 (×34): 100 mg via ORAL
  Filled 2022-10-30 (×34): qty 1

## 2022-10-30 MED ORDER — POLYETHYLENE GLYCOL 3350 17 G PO PACK: 17.0000 g | PACK | Freq: Every day | ORAL | Status: DC | PRN

## 2022-10-30 MED ORDER — DOCUSATE SODIUM 50 MG/5ML PO LIQD
100.0000 mg | Freq: Two times a day (BID) | ORAL | Status: DC
  Administered 2022-11-03 – 2022-11-16 (×19): 100 mg via ORAL
  Filled 2022-10-30 (×30): qty 10

## 2022-10-30 MED ORDER — FOLIC ACID 1 MG PO TABS
1.0000 mg | ORAL_TABLET | Freq: Every day | ORAL | Status: DC
  Administered 2022-10-30 – 2022-12-03 (×34): 1 mg via ORAL
  Filled 2022-10-30 (×34): qty 1

## 2022-10-30 MED ORDER — POLYETHYLENE GLYCOL 3350 17 G PO PACK
17.0000 g | PACK | Freq: Every day | ORAL | Status: DC
  Administered 2022-10-31 – 2022-11-29 (×21): 17 g via ORAL
  Filled 2022-10-30 (×32): qty 1

## 2022-10-30 NOTE — Progress Notes (Signed)
Physical Therapy Treatment Patient Details Name: Tedman Lilly MRN: [id number] DOB: 09/06/1956 Today's Date: 10/30/2022   History of Present Illness This is a 67 year old gentleman admitted 2/2 after being found down at home after a recent d/c from hospital where he refused SNF. Pt was hyponatremic hypoxemic hypercarbic was placed on BiPAP in the emergency room. Ultimately failed BiPAP requiring intubation mechanical ventilation 2/2--2/3. 2/6: PTA discussed with DO pt R heel ulcer (POA) and pt NWB as of 2/7. PMH: polysubstance abuse, cocaine, alcohol use, R heel pressure injury.     PT Comments    Pt greeted supine and ultimately agreeable to session with focus on functional transfers for increased activity tolerance. Pt needing increased time and cues to initiate mobility and demonstrating continued intermittent agitation. Pt grossly min assist for bed mobility and anterior/posterior transfers with increased assist and consistent MAX cues/education needed during all mobility to maintain NWB precaution with pt demonstrating poor compliance. Pt declining further therex/mobility once up in chair. Continued education attempts re; importance of OOB mobility and benefits of time up in chair. Pt continues to benefit from skilled PT services to progress toward functional mobility goals.    Recommendations for follow up therapy are one component of a multi-disciplinary discharge planning process, led by the attending physician.  Recommendations may be updated based on patient status, additional functional criteria and insurance authorization.  Follow Up Recommendations  Skilled nursing-short term rehab (<3 hours/day)    Assistance Recommended at Discharge Frequent or constant Supervision/Assistance  Patient can return home with the following Assistance with cooking/housework;Direct supervision/assist for financial management;Direct supervision/assist for medications management;Assist for  transportation;A little help with bathing/dressing/bathroom;A lot of help with walking and/or transfers;Help with stairs or ramp for entrance   Equipment Recommendations  Wheelchair (measurements PT);Wheelchair cushion (measurements PT);Other (comment)    Recommendations for Other Services       Precautions / Restrictions Precautions Precautions: Fall Precaution Comments: 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) and large R heel wound (prevalon boot) Required Braces or Orthoses:  (nothing ordered, DO messaged to ask about heel offloading shoes) Restrictions Weight Bearing Restrictions: Yes RLE Weight Bearing: Non weight bearing Other Position/Activity Restrictions: NWB orders now in chart; no [employer] shoe orders, but evaluating PT and PTA have asked for one     Mobility  Bed Mobility Overal bed mobility: Needs Assistance Bed Mobility: Supine to Sit     Supine to sit: HOB elevated, Min assist     General bed mobility comments: Pt required min A to lift RLE due to pt being unable to complete bed mobility without WB through R heel.    Transfers Overall transfer level: Needs assistance Equipment used: None Transfers: Bed to chair/wheelchair/BSC         Anterior-Posterior transfers: Min assist   General transfer comment: Pt requiring min A to raise RLE to prevent WB. Pt continued to pull RLE under him to assist with transfer despite education on NWB status. assist to elevate hips to scoot as pt with pain in R wrist    Ambulation/Gait               General Gait Details: unable- large heel wound and no heel offloading shoes or orthosis in the room   Stairs             Wheelchair Mobility    Modified Rankin (Stroke Patients Only)       Balance Overall balance assessment: Needs assistance Sitting-balance support: Bilateral upper extremity supported, Feet  supported Sitting balance-Leahy Scale: Fair Sitting balance - Comments: pt defers at time of  session Postural control: Left lateral lean (Towards elevated HOB with LUE support)     Standing balance comment: N/A- now NWB on RLE                            Cognition Arousal/Alertness: Awake/alert Behavior During Therapy: WFL for tasks assessed/performed Overall Cognitive Status: No family/caregiver present to determine baseline cognitive functioning Area of Impairment: Following commands, Safety/judgement, Problem solving, Awareness, Memory                     Memory: Decreased short-term memory Following Commands: Follows multi-step commands inconsistently, Follows one step commands with increased time Safety/Judgement: Decreased awareness of safety Awareness: Intellectual, Emergent Problem Solving: Difficulty sequencing, Requires verbal cues General Comments: pt benefits from patient lead session when able. Following commands with increased time.        Exercises      General Comments General comments (skin integrity, edema, etc.): agreeable to pillow to elevate heel off bed      Pertinent Vitals/Pain Pain Assessment Pain Assessment: Faces Faces Pain Scale: Hurts little more Pain Location: R wrist Pain Descriptors / Indicators: Grimacing, Guarding, Sore Pain Intervention(s): Monitored during session, Limited activity within patient's tolerance, Repositioned    Home Living                          Prior Function            PT Goals (current goals can now be found in the care plan section) Acute Rehab PT Goals PT Goal Formulation: With patient Time For Goal Achievement: 10/30/22 Progress towards PT goals: Progressing toward goals    Frequency    Min 3X/week      PT Plan Current plan remains appropriate    Co-evaluation              AM-PAC PT "6 Clicks" Mobility   Outcome Measure  Help needed turning from your back to your side while in a flat bed without using bedrails?: A Little Help needed moving from lying  on your back to sitting on the side of a flat bed without using bedrails?: A Little Help needed moving to and from a bed to a chair (including a wheelchair)?: A Little Help needed standing up from a chair using your arms (e.g., wheelchair or bedside chair)?: Total Help needed to walk in hospital room?: Total Help needed climbing 3-5 steps with a railing? : Total 6 Click Score: 12    End of Session   Activity Tolerance: Patient limited by pain Patient left: with call bell/phone within reach;in chair;with chair alarm set Nurse Communication: Mobility status PT Visit Diagnosis: Other abnormalities of gait and mobility (R26.89);Muscle weakness (generalized) (M62.81);History of falling (Z91.81)     Time: [id number] PT Time Calculation (min) (ACUTE ONLY): 12 min  Charges:  $Therapeutic Activity: 8-22 mins                     Aidynn Krenn R. PTA Acute Rehabilitation Services Office: [place] 10/30/2022, 1:05 PM

## 2022-10-30 NOTE — Progress Notes (Signed)
PROGRESS NOTE  [name] PX:[id number] DOB: 06/04/1956 DOA: 10/15/2022 PCP: Demetrios Isaacs, FNP   LOS: 15 days   Brief Narrative / Interim history: 67 year old male with with prior polysubstance abuse, EtOH, cocaine, noncompliance, right heel unstageable pressure injury, PVD who comes into the hospital with altered mental status.  He was recently admitted and discharged home as he refused SNF, and came back to the hospital confused, hypoxic, hypercarbic and noted to be aspirating.  He was initially required to the ICU requiring intubation.  Eventually extubated on 2/3 and transferred to [facility] on 2/5.  Currently stable, awaiting SNF  Subjective / 24h Interval events: No overnight events.  Has no complaints  Assesement and Plan: Principal Problem:   Respiratory failure ([place]) Active Problems:   Alcohol abuse  Principal problem Acute hypoxic and hypercarbic respiratory failure -initially requiring intubation, eventually extubated and now he is stable on room air.  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   Chronic EtOH abuse with concern for withdrawal, cocaine abuse - UDS positive for cocaine on 09/20/2022. Continue folic acid supplement, thiamine supplement, multivitamins, out of window for alcohol withdrawal.   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 - Continue home regimen   Unstageable right heel pressure injury, POA - Continue local wound care  -unfortunately patient is not interested in following the advice of keeping heel off bed/NWB   Right elbow swelling- Cellulitis vs  thrombophlebitis  -no fracture, resolved    Scheduled Meds:  docusate  100 mg Oral BID   feeding supplement (NEPRO CARB STEADY)  237 mL Oral Q24H   fluticasone furoate-vilanterol  1 puff Inhalation Daily   folic acid  1 mg Oral Daily   heparin  5,000 Units Subcutaneous Q8H   multivitamin with minerals  1 tablet Oral Daily   pantoprazole  40 mg Oral Daily   polyethylene glycol  17 g Oral Daily   thiamine  100 mg Oral Daily   Continuous Infusions: PRN Meds:.albuterol, HYDROcodone-acetaminophen, mouth rinse, polyethylene glycol  Current Outpatient Medications  Medication Instructions   albuterol (VENTOLIN HFA) 108 (90 Base) MCG/ACT inhaler 2 puffs, Inhalation, Every 6 hours PRN   amLODipine (NORVASC) 10 mg, Oral, Daily   fluticasone furoate-vilanterol (BREO ELLIPTA) 100-25 MCG/ACT AEPB 1 puff, Inhalation, Daily   folic acid (FOLVITE) 1 mg, Oral, Daily   guaiFENesin-dextromethorphan (ROBITUSSIN DM) 100-10 MG/5ML syrup 10 mLs, Oral, Every 4 hours PRN   lisinopril (ZESTRIL) 10 mg, Oral, Daily   Multiple Vitamin (MULTIVITAMIN WITH MINERALS) TABS tablet 1 tablet, Oral, Daily   pantoprazole (PROTONIX) 40 mg, Oral, Daily   thiamine (VITAMIN B1) 100 mg, Oral, Daily    Diet Orders (From admission, onward)     Start     Ordered   10/23/22 1420  DIET DYS 3 Room service appropriate? No; Fluid consistency: Thin  Diet effective now       Question Answer Comment  Room service appropriate? No   Fluid consistency: Thin      10/23/22 1419            DVT prophylaxis: heparin injection 5,000 Units Start: 10/15/22 0930   Lab  Results  Component Value Date   PLT 521 (H) 10/27/2022      Code Status: Full Code  Family Communication: no family at bedside   Status is: Inpatient  Remains inpatient appropriate because: awaiting placement.  Level of care: Med-Surg  Consultants:  PCCM  Objective: Vitals:   10/29/22 2008 10/30/22 0404 10/30/22 0500 10/30/22 0732  BP: (!) 150/88  122/72  128/73  Pulse: 98 92  98  Resp: 18 16  19  $ Temp: 98.2 F (36.8 C) 97.9 F (36.6 C)  (!) 97.5 F (36.4 C)  TempSrc: Oral Oral  Oral  SpO2: 98% 96%  98%  Weight:   63.9 kg   Height:        Intake/Output Summary (Last 24 hours) at 10/30/2022 1109 Last data filed at 10/30/2022 0900 Gross per 24 hour  Intake 1200 ml  Output 2975 ml  Net -1775 ml    Wt Readings from Last 3 Encounters:  10/30/22 63.9 kg  09/23/22 58.8 kg  09/17/22 56.8 kg    Examination:  Constitutional: NAD Respiratory: CTA Cardiovascular: RRR  Data Reviewed: I have independently reviewed following labs and imaging studies   CBC Recent Labs  Lab 10/24/22 0337 10/26/22 0251 10/27/22 0313  WBC 8.6 9.1 8.4  HGB 9.5* 9.7* 9.4*  HCT 27.8* 28.2* 27.9*  PLT 460* 595* 521*  MCV 93.9 93.4 94.6  MCH 32.1 32.1 31.9  MCHC 34.2 34.4 33.7  RDW 17.1* 16.9* 16.7*     Recent Labs  Lab 10/24/22 0337 10/26/22 0251 10/27/22 0313  NA 130* 128* 127*  K 4.4 4.4 4.3  CL 93* 91* 92*  CO2 26 28 26  $ GLUCOSE 113* 118* 102*  BUN 10 11 9  $ CREATININE 0.48* 0.53* 0.50*  CALCIUM 8.9 9.1 9.2     ------------------------------------------------------------------------------------------------------------------ No results for input(s): "CHOL", "HDL", "LDLCALC", "TRIG", "CHOLHDL", "LDLDIRECT" in the last 72 hours.  Lab Results  Component Value Date   HGBA1C 5.6 09/02/2022   ------------------------------------------------------------------------------------------------------------------ No results for input(s): "TSH", "T4TOTAL", "T3FREE", "THYROIDAB" in the last 72 hours.  Invalid input(s): "FREET3"  Cardiac Enzymes No results for input(s): "CKMB", "TROPONINI", "MYOGLOBIN" in the last 168 hours.  Invalid input(s): "CK" ------------------------------------------------------------------------------------------------------------------    Component Value Date/Time   BNP 71.0 10/15/2022 0636     CBG: Recent Labs  Lab 10/27/22 1610 10/27/22 2011 10/28/22 0801 10/28/22 1557 10/29/22 2157  GLUCAP 107* 124* 103* 114* 101*     No results found for this or any previous visit (from the past 240 hour(s)).   Radiology Studies: No results found.   Marzetta Board, MD, PhD [facility]  Between 7 am - 7 pm I am available, please contact me via Amion (for emergencies) or Securechat (non urgent messages)  Between 7 pm - 7 am I am not available, please contact night coverage MD/APP via Amion

## 2022-10-31 DIAGNOSIS — J9601 Acute respiratory failure with hypoxia: Secondary | ICD-10-CM | POA: Diagnosis not present

## 2022-10-31 DIAGNOSIS — J9602 Acute respiratory failure with hypercapnia: Secondary | ICD-10-CM | POA: Diagnosis not present

## 2022-10-31 NOTE — Progress Notes (Signed)
PROGRESS NOTE  [name] PX:[id number] DOB: 10-01-1955 DOA: 10/15/2022 PCP: Demetrios Isaacs, FNP   LOS: 16 days   Brief Narrative / Interim history: 67 year old male with with prior polysubstance abuse, EtOH, cocaine, noncompliance, right heel unstageable pressure injury, PVD who comes into the hospital with altered mental status.  He was recently admitted and discharged home as he refused SNF, and came back to the hospital confused, hypoxic, hypercarbic and noted to be aspirating.  He was initially required to the ICU requiring intubation.  Eventually extubated on 2/3 and transferred to [facility] on 2/5.  Currently stable, awaiting SNF  Subjective / 24h Interval events: No complaints  Assesement and Plan: Principal Problem:   Respiratory failure ([place]) Active Problems:   Alcohol abuse  Principal problem Acute hypoxic and hypercarbic respiratory failure -initially requiring intubation, eventually extubated and now he is stable on room air.  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   Chronic EtOH abuse with concern for withdrawal, cocaine abuse - UDS positive for cocaine on 09/20/2022. Continue folic acid supplement, thiamine supplement, multivitamins, out of window for alcohol withdrawal.   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 - Continue home regimen   Unstageable right heel pressure injury, POA - Continue local wound care  -unfortunately patient is not interested in following the advice of keeping heel off bed/NWB   Right elbow swelling- Cellulitis vs thrombophlebitis  -no fracture,  resolved    Scheduled Meds:  docusate  100 mg Oral BID   feeding supplement (NEPRO CARB STEADY)  237 mL Oral Q24H   fluticasone furoate-vilanterol  1 puff Inhalation Daily   folic acid  1 mg Oral Daily   heparin  5,000 Units Subcutaneous Q8H   multivitamin with minerals  1 tablet Oral Daily   pantoprazole  40 mg Oral Daily   polyethylene glycol  17 g Oral Daily   thiamine  100 mg Oral Daily   Continuous Infusions: PRN Meds:.albuterol, HYDROcodone-acetaminophen, mouth rinse, polyethylene glycol  Current Outpatient Medications  Medication Instructions   albuterol (VENTOLIN HFA) 108 (90 Base) MCG/ACT inhaler 2 puffs, Inhalation, Every 6 hours PRN   amLODipine (NORVASC) 10 mg, Oral, Daily   fluticasone furoate-vilanterol (BREO ELLIPTA) 100-25 MCG/ACT AEPB 1 puff, Inhalation, Daily   folic acid (FOLVITE) 1 mg, Oral, Daily   guaiFENesin-dextromethorphan (ROBITUSSIN DM) 100-10 MG/5ML syrup 10 mLs, Oral, Every 4 hours PRN   lisinopril (ZESTRIL) 10 mg, Oral, Daily   Multiple Vitamin (MULTIVITAMIN WITH MINERALS) TABS tablet 1 tablet, Oral, Daily   pantoprazole (PROTONIX) 40 mg, Oral, Daily   thiamine (VITAMIN B1) 100 mg, Oral, Daily    Diet Orders (From admission, onward)     Start     Ordered   10/23/22 1420  DIET DYS 3 Room service appropriate? No; Fluid consistency: Thin  Diet effective now       Question Answer Comment  Room service appropriate? No   Fluid consistency: Thin      10/23/22 1419            DVT prophylaxis: heparin injection 5,000 Units Start: 10/15/22 0930   Lab Results  Component Value Date  PLT 521 (H) 10/27/2022      Code Status: Full Code  Family Communication: no family at bedside   Status is: Inpatient  Remains inpatient appropriate because: awaiting placement.  Level of care: Med-Surg  Consultants:  PCCM  Objective: Vitals:   10/30/22 1953 10/30/22 2050 10/31/22 0417 10/31/22 0856  BP: (!) 170/90 (!) 155/94 (!) 141/87 (!) 145/84   Pulse:  97 91 98  Resp:  17 17 16  $ Temp:  98.5 F (36.9 C) 98.1 F (36.7 C) 97.8 F (36.6 C)  TempSrc:  Oral Oral Axillary  SpO2:  96% 96% 100%  Weight:      Height:        Intake/Output Summary (Last 24 hours) at 10/31/2022 1030 Last data filed at 10/31/2022 0857 Gross per 24 hour  Intake 1080 ml  Output 3150 ml  Net -2070 ml    Wt Readings from Last 3 Encounters:  10/30/22 63.9 kg  09/23/22 58.8 kg  09/17/22 56.8 kg    Examination:  Constitutional: NAD Respiratory: CTA Cardiovascular: RRR  Data Reviewed: I have independently reviewed following labs and imaging studies   CBC Recent Labs  Lab 10/26/22 0251 10/27/22 0313  WBC 9.1 8.4  HGB 9.7* 9.4*  HCT 28.2* 27.9*  PLT 595* 521*  MCV 93.4 94.6  MCH 32.1 31.9  MCHC 34.4 33.7  RDW 16.9* 16.7*     Recent Labs  Lab 10/26/22 0251 10/27/22 0313  NA 128* 127*  K 4.4 4.3  CL 91* 92*  CO2 28 26  GLUCOSE 118* 102*  BUN 11 9  CREATININE 0.53* 0.50*  CALCIUM 9.1 9.2     ------------------------------------------------------------------------------------------------------------------ No results for input(s): "CHOL", "HDL", "LDLCALC", "TRIG", "CHOLHDL", "LDLDIRECT" in the last 72 hours.  Lab Results  Component Value Date   HGBA1C 5.6 09/02/2022   ------------------------------------------------------------------------------------------------------------------ No results for input(s): "TSH", "T4TOTAL", "T3FREE", "THYROIDAB" in the last 72 hours.  Invalid input(s): "FREET3"  Cardiac Enzymes No results for input(s): "CKMB", "TROPONINI", "MYOGLOBIN" in the last 168 hours.  Invalid input(s): "CK" ------------------------------------------------------------------------------------------------------------------    Component Value Date/Time   BNP 71.0 10/15/2022 0636    CBG: Recent Labs  Lab 10/27/22 1610 10/27/22 2011 10/28/22 0801 10/28/22 1557 10/29/22 2157  GLUCAP 107* 124* 103* 114* 101*      No results found for this or any previous visit (from the past 240 hour(s)).   Radiology Studies: No results found.   Marzetta Board, MD, PhD [facility]  Between 7 am - 7 pm I am available, please contact me via Amion (for emergencies) or Securechat (non urgent messages)  Between 7 pm - 7 am I am not available, please contact night coverage MD/APP via Amion

## 2022-11-01 DIAGNOSIS — J9602 Acute respiratory failure with hypercapnia: Secondary | ICD-10-CM | POA: Diagnosis not present

## 2022-11-01 DIAGNOSIS — J9601 Acute respiratory failure with hypoxia: Secondary | ICD-10-CM | POA: Diagnosis not present

## 2022-11-01 DIAGNOSIS — J1569 Pneumonia due to other gram-negative bacteria: Secondary | ICD-10-CM | POA: Diagnosis not present

## 2022-11-01 LAB — CBC
HCT: 29.1 % — ABNORMAL LOW (ref 39.0–52.0)
Hemoglobin: 9.7 g/dL — ABNORMAL LOW (ref 13.0–17.0)
MCH: 32 pg (ref 26.0–34.0)
MCHC: 33.3 g/dL (ref 30.0–36.0)
MCV: 96 fL (ref 80.0–100.0)
Platelets: 472 10*3/uL — ABNORMAL HIGH (ref 150–400)
RBC: 3.03 MIL/uL — ABNORMAL LOW (ref 4.22–5.81)
RDW: 16.7 % — ABNORMAL HIGH (ref 11.5–15.5)
WBC: 7.1 10*3/uL (ref 4.0–10.5)
nRBC: 0 % (ref 0.0–0.2)

## 2022-11-01 LAB — COMPREHENSIVE METABOLIC PANEL
ALT: 13 U/L (ref 0–44)
AST: 16 U/L (ref 15–41)
Albumin: 3.4 g/dL — ABNORMAL LOW (ref 3.5–5.0)
Alkaline Phosphatase: 55 U/L (ref 38–126)
Anion gap: 12 (ref 5–15)
BUN: 7 mg/dL — ABNORMAL LOW (ref 8–23)
CO2: 28 mmol/L (ref 22–32)
Calcium: 9.3 mg/dL (ref 8.9–10.3)
Chloride: 93 mmol/L — ABNORMAL LOW (ref 98–111)
Creatinine, Ser: 0.53 mg/dL — ABNORMAL LOW (ref 0.61–1.24)
GFR, Estimated: >60 mL/min (ref >60)
Glucose, Bld: 117 mg/dL — ABNORMAL HIGH (ref 70–99)
Potassium: 3.9 mmol/L (ref 3.5–5.1)
Sodium: 133 mmol/L — ABNORMAL LOW (ref 135–145)
Total Bilirubin: 0.4 mg/dL (ref 0.3–1.2)
Total Protein: 6.7 g/dL (ref 6.5–8.1)

## 2022-11-01 LAB — MAGNESIUM: Magnesium: 1.6 mg/dL — ABNORMAL LOW (ref 1.7–2.4)

## 2022-11-01 NOTE — Progress Notes (Signed)
PROGRESS NOTE  [name] [id number]:[id number] DOB: 09-Jun-1956 DOA: 10/15/2022 PCP: Demetrios Isaacs, FNP   LOS: 17 days   Brief Narrative / Interim history: 67 year old male with with prior polysubstance abuse, EtOH, cocaine, noncompliance, right heel unstageable pressure injury, PVD who comes into the hospital with altered mental status.  He was recently admitted and discharged home as he refused SNF, and came back to the hospital confused, hypoxic, hypercarbic and noted to be aspirating.  He was initially required to the ICU requiring intubation.  Eventually extubated on 2/3 and transferred to [facility] on 2/5.  Currently stable, awaiting SNF  Subjective / 24h Interval events: No complaints  Assesement and Plan: Principal Problem:   Respiratory failure ([place]) Active Problems:   Alcohol abuse  Principal problem Acute hypoxic and hypercarbic respiratory failure -initially requiring intubation, eventually extubated and now he is stable on room air.  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   Chronic EtOH abuse with concern for withdrawal, cocaine abuse - UDS positive for cocaine on 09/20/2022. Continue folic acid supplement, thiamine supplement, multivitamins, out of window for alcohol withdrawal.   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 - Continue home regimen   Unstageable right heel pressure injury, POA - Continue local wound care  -unfortunately patient is not interested in following the advice of keeping heel off bed/NWB   Right elbow swelling- Cellulitis vs thrombophlebitis  -no fracture,  resolved    Scheduled Meds:  docusate  100 mg Oral BID   feeding supplement (NEPRO CARB STEADY)  237 mL Oral Q24H   fluticasone furoate-vilanterol  1 puff Inhalation Daily   folic acid  1 mg Oral Daily   heparin  5,000 Units Subcutaneous Q8H   multivitamin with minerals  1 tablet Oral Daily   pantoprazole  40 mg Oral Daily   polyethylene glycol  17 g Oral Daily   thiamine  100 mg Oral Daily   Continuous Infusions: PRN Meds:.albuterol, HYDROcodone-acetaminophen, mouth rinse, polyethylene glycol  Current Outpatient Medications  Medication Instructions   albuterol (VENTOLIN HFA) 108 (90 Base) MCG/ACT inhaler 2 puffs, Inhalation, Every 6 hours PRN   amLODipine (NORVASC) 10 mg, Oral, Daily   fluticasone furoate-vilanterol (BREO ELLIPTA) 100-25 MCG/ACT AEPB 1 puff, Inhalation, Daily   folic acid (FOLVITE) 1 mg, Oral, Daily   guaiFENesin-dextromethorphan (ROBITUSSIN DM) 100-10 MG/5ML syrup 10 mLs, Oral, Every 4 hours PRN   lisinopril (ZESTRIL) 10 mg, Oral, Daily   Multiple Vitamin (MULTIVITAMIN WITH MINERALS) TABS tablet 1 tablet, Oral, Daily   pantoprazole (PROTONIX) 40 mg, Oral, Daily   thiamine (VITAMIN B1) 100 mg, Oral, Daily    Diet Orders (From admission, onward)     Start     Ordered   10/23/22 1420  DIET DYS 3 Room service appropriate? No; Fluid consistency: Thin  Diet effective now       Question Answer Comment  Room service appropriate? No   Fluid consistency: Thin      10/23/22 1419            DVT prophylaxis: heparin injection 5,000 Units Start: 10/15/22 0930   Lab Results  Component Value Date  PLT 472 (H) 11/01/2022      Code Status: Full Code  Family Communication: no family at bedside   Status is: Inpatient  Remains inpatient appropriate because: awaiting placement.  Level of care: Med-Surg  Consultants:  PCCM  Objective: Vitals:   10/31/22 0856 10/31/22 1500 10/31/22 1944 11/01/22 0420  BP: (!) 145/84 110/83 (!) 151/87 133/81  Pulse:  98 (!) 104 (!) 107 100  Resp: 16 18 18 18  $ Temp: 97.8 F (36.6 C) 98 F (36.7 C) 98.6 F (37 C) 97.8 F (36.6 C)  TempSrc: Axillary Oral Oral   SpO2: 100% 99% 98% 95%  Weight:      Height:        Intake/Output Summary (Last 24 hours) at 11/01/2022 1000 Last data filed at 11/01/2022 0813 Gross per 24 hour  Intake 960 ml  Output 1625 ml  Net -665 ml    Wt Readings from Last 3 Encounters:  10/30/22 63.9 kg  09/23/22 58.8 kg  09/17/22 56.8 kg    Examination:  Constitutional: NAD Respiratory: CTA Cardiovascular: RRR  Data Reviewed: I have independently reviewed following labs and imaging studies   CBC Recent Labs  Lab 10/26/22 0251 10/27/22 0313 11/01/22 0458  WBC 9.1 8.4 7.1  HGB 9.7* 9.4* 9.7*  HCT 28.2* 27.9* 29.1*  PLT 595* 521* 472*  MCV 93.4 94.6 96.0  MCH 32.1 31.9 32.0  MCHC 34.4 33.7 33.3  RDW 16.9* 16.7* 16.7*     Recent Labs  Lab 10/26/22 0251 10/27/22 0313 11/01/22 0458  NA 128* 127* 133*  K 4.4 4.3 3.9  CL 91* 92* 93*  CO2 28 26 28  $ GLUCOSE 118* 102* 117*  BUN 11 9 7*  CREATININE 0.53* 0.50* 0.53*  CALCIUM 9.1 9.2 9.3  AST  --   --  16  ALT  --   --  13  ALKPHOS  --   --  55  BILITOT  --   --  0.4  ALBUMIN  --   --  3.4*  MG  --   --  1.6*     ------------------------------------------------------------------------------------------------------------------ No results for input(s): "CHOL", "HDL", "LDLCALC", "TRIG", "CHOLHDL", "LDLDIRECT" in the last 72 hours.  Lab Results  Component Value Date   HGBA1C 5.6 09/02/2022   ------------------------------------------------------------------------------------------------------------------ No results for input(s): "TSH", "T4TOTAL", "T3FREE", "THYROIDAB" in the last 72 hours.  Invalid input(s): "FREET3"  Cardiac Enzymes No results for input(s): "CKMB", "TROPONINI", "MYOGLOBIN" in the last 168 hours.  Invalid input(s):  "CK" ------------------------------------------------------------------------------------------------------------------    Component Value Date/Time   BNP 71.0 10/15/2022 0636    CBG: Recent Labs  Lab 10/27/22 1610 10/27/22 2011 10/28/22 0801 10/28/22 1557 10/29/22 2157  GLUCAP 107* 124* 103* 114* 101*     No results found for this or any previous visit (from the past 240 hour(s)).   Radiology Studies: No results found.   Marzetta Board, MD, PhD [facility]  Between 7 am - 7 pm I am available, please contact me via Amion (for emergencies) or Securechat (non urgent messages)  Between 7 pm - 7 am I am not available, please contact night coverage MD/APP via Amion

## 2022-11-01 NOTE — Progress Notes (Signed)
Mobility Specialist Progress Note    11/01/22 1215  Mobility  Activity  (wheelchair mobility)  Level of Assistance Moderate assist, patient does 50-74%  Assistive Device Wheelchair  Distance Ambulated (ft) 450 ft (propulsion)  RLE Weight Bearing NWB  Activity Response Tolerated fair  Mobility Referral Yes  $Mobility charge 1 Mobility   Pt received in bed and agreeable. Pt noncompliant with WB restriction during posterior scoot to WC. Attempted to hold pt's leg up to prevent WB and pt pushed WC away requiring assist to scoot back into the bed to prevent fall. Pt minA to complete posterior scoot while using RLE to push through heel. When returning to room, pt maxA +2 to attempt anterior scoot into bed. Pt repeatedly stating he cannot push up and noncompliant with hand placement and safety cues. RN present to assist lifting patient back into bed using pads. Pt continuously complaining. Left with call bell in reach.   Hildred Alamin Mobility Specialist  Please Psychologist, sport and exercise or Rehab Office at [phone]

## 2022-11-02 DIAGNOSIS — J9602 Acute respiratory failure with hypercapnia: Secondary | ICD-10-CM | POA: Diagnosis not present

## 2022-11-02 DIAGNOSIS — J9601 Acute respiratory failure with hypoxia: Secondary | ICD-10-CM | POA: Diagnosis not present

## 2022-11-02 MED ORDER — MAGNESIUM OXIDE -MG SUPPLEMENT 400 (240 MG) MG PO TABS
400.0000 mg | ORAL_TABLET | Freq: Every day | ORAL | Status: DC
  Administered 2022-11-02 – 2022-12-03 (×31): 400 mg via ORAL
  Filled 2022-11-02 (×31): qty 1

## 2022-11-02 MED ORDER — MAGNESIUM SULFATE 2 GM/50ML IV SOLN
2.0000 g | Freq: Once | INTRAVENOUS | Status: DC
  Filled 2022-11-02: qty 50

## 2022-11-02 NOTE — Progress Notes (Signed)
PT Cancellation Note  Patient Details Name: [name] MRN: [id number] DOB: June 18, 1956   Cancelled Treatment:    Reason Eval/Treat Not Completed: Pain limiting ability to participate. Patient states his heel is really hurting and declined attempting to get up to chair. Requesting pain medication. RN notified. Will continue to attempt PT.    Jerrelle Michelsen 11/02/2022, 2:00 PM

## 2022-11-02 NOTE — Progress Notes (Signed)
PROGRESS NOTE  [name] DOB: 10/11/1955 DOA: 10/15/2022 PCP: Demetrios Isaacs, FNP   LOS: 18 days   Brief Narrative / Interim history: 67 year old male with with prior polysubstance abuse, EtOH, cocaine, noncompliance, right heel unstageable pressure injury, PVD who comes into the hospital with altered mental status.  He was recently admitted and discharged home as he refused SNF, and came back to the hospital confused, hypoxic, hypercarbic and noted to be aspirating.  He was initially required to the ICU requiring intubation.  Eventually extubated on 2/3 and transferred to [facility] on 2/5.  Currently stable, awaiting SNF  Subjective / 24h Interval events: No complaints, eating breakfast  Assesement and Plan: Principal Problem:   Respiratory failure ([place]) Active Problems:   Alcohol abuse  Principal problem Acute hypoxic and hypercarbic respiratory failure -initially requiring intubation, eventually extubated and now he is stable on room air.  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   Chronic EtOH abuse with concern for withdrawal, cocaine abuse - UDS positive for cocaine on 09/20/2022. Continue folic acid supplement, thiamine supplement, multivitamins, out of window for alcohol withdrawal.   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 - Continue home regimen   Unstageable right heel pressure injury, POA - Continue local wound care  -unfortunately patient is not interested in following the advice of keeping heel off bed/NWB   Right elbow swelling- Cellulitis vs thrombophlebitis   -no fracture, resolved    Scheduled Meds:  docusate  100 mg Oral BID   feeding supplement (NEPRO CARB STEADY)  237 mL Oral Q24H   fluticasone furoate-vilanterol  1 puff Inhalation Daily   folic acid  1 mg Oral Daily   heparin  5,000 Units Subcutaneous Q8H   magnesium oxide  400 mg Oral Daily   multivitamin with minerals  1 tablet Oral Daily   pantoprazole  40 mg Oral Daily   polyethylene glycol  17 g Oral Daily   thiamine  100 mg Oral Daily   Continuous Infusions: PRN Meds:.albuterol, HYDROcodone-acetaminophen, mouth rinse, polyethylene glycol  Current Outpatient Medications  Medication Instructions   albuterol (VENTOLIN HFA) 108 (90 Base) MCG/ACT inhaler 2 puffs, Inhalation, Every 6 hours PRN   amLODipine (NORVASC) 10 mg, Oral, Daily   fluticasone furoate-vilanterol (BREO ELLIPTA) 100-25 MCG/ACT AEPB 1 puff, Inhalation, Daily   folic acid (FOLVITE) 1 mg, Oral, Daily   guaiFENesin-dextromethorphan (ROBITUSSIN DM) 100-10 MG/5ML syrup 10 mLs, Oral, Every 4 hours PRN   lisinopril (ZESTRIL) 10 mg, Oral, Daily   Multiple Vitamin (MULTIVITAMIN WITH MINERALS) TABS tablet 1 tablet, Oral, Daily   pantoprazole (PROTONIX) 40 mg, Oral, Daily   thiamine (VITAMIN B1) 100 mg, Oral, Daily    Diet Orders (From admission, onward)     Start     Ordered   10/23/22 1420  DIET DYS 3 Room service appropriate? No; Fluid consistency: Thin  Diet effective now       Question Answer Comment  Room service appropriate? No   Fluid consistency: Thin      10/23/22 1419            DVT prophylaxis: heparin injection 5,000 Units  Start: 10/15/22 0930   Lab Results  Component Value Date   PLT 472 (H) 11/01/2022      Code Status: Full Code  Family Communication: no family at bedside   Status is: Inpatient  Remains inpatient appropriate because: awaiting placement.  Level of care: Med-Surg  Consultants:  PCCM  Objective: Vitals:   11/01/22 0420 11/01/22 1325 11/01/22 2059 11/02/22 0727   BP: 133/81 139/77 (!) 156/90 (!) 158/95  Pulse: 100 (!) 105  95  Resp: 18 17 17 18  $ Temp: 97.8 F (36.6 C) 98.1 F (36.7 C) 98.6 F (37 C) 98.1 F (36.7 C)  TempSrc:  Oral Oral Oral  SpO2: 95% 99% 99% 98%  Weight:      Height:        Intake/Output Summary (Last 24 hours) at 11/02/2022 0936 Last data filed at 11/02/2022 0430 Gross per 24 hour  Intake 240 ml  Output 3601 ml  Net -3361 ml    Wt Readings from Last 3 Encounters:  10/30/22 63.9 kg  09/23/22 58.8 kg  09/17/22 56.8 kg    Examination:  Constitutional: NAD Respiratory: CTA Cardiovascular: RRR  Data Reviewed: I have independently reviewed following labs and imaging studies   CBC Recent Labs  Lab 10/27/22 0313 11/01/22 0458  WBC 8.4 7.1  HGB 9.4* 9.7*  HCT 27.9* 29.1*  PLT 521* 472*  MCV 94.6 96.0  MCH 31.9 32.0  MCHC 33.7 33.3  RDW 16.7* 16.7*     Recent Labs  Lab 10/27/22 0313 11/01/22 0458  NA 127* 133*  K 4.3 3.9  CL 92* 93*  CO2 26 28  GLUCOSE 102* 117*  BUN 9 7*  CREATININE 0.50* 0.53*  CALCIUM 9.2 9.3  AST  --  16  ALT  --  13  ALKPHOS  --  55  BILITOT  --  0.4  ALBUMIN  --  3.4*  MG  --  1.6*     ------------------------------------------------------------------------------------------------------------------ No results for input(s): "CHOL", "HDL", "LDLCALC", "TRIG", "CHOLHDL", "LDLDIRECT" in the last 72 hours.  Lab Results  Component Value Date   HGBA1C 5.6 09/02/2022   ------------------------------------------------------------------------------------------------------------------ No results for input(s): "TSH", "T4TOTAL", "T3FREE", "THYROIDAB" in the last 72 hours.  Invalid input(s): "FREET3"  Cardiac Enzymes No results for input(s): "CKMB", "TROPONINI", "MYOGLOBIN" in the last 168 hours.  Invalid input(s): "CK" ------------------------------------------------------------------------------------------------------------------    Component Value Date/Time   BNP  71.0 10/15/2022 0636    CBG: Recent Labs  Lab 10/27/22 1610 10/27/22 2011 10/28/22 0801 10/28/22 1557 10/29/22 2157  GLUCAP 107* 124* 103* 114* 101*     No results found for this or any previous visit (from the past 240 hour(s)).   Radiology Studies: No results found.   Marzetta Board, MD, PhD [facility]  Between 7 am - 7 pm I am available, please contact me via Amion (for emergencies) or Securechat (non urgent messages)  Between 7 pm - 7 am I am not available, please contact night coverage MD/APP via Amion

## 2022-11-02 NOTE — Progress Notes (Signed)
Nutrition Follow-up  DOCUMENTATION CODES:   Severe malnutrition in context of chronic illness  INTERVENTION:  Continue diet recommendations per ST Nepro Shake po once daily, each supplement provides 425 kcal and 19 grams protein MVI with minerals daily  NUTRITION DIAGNOSIS:   Severe Malnutrition related to chronic illness (COPD, polysubstance abuse) as evidenced by severe fat depletion, severe muscle depletion. - remains applicable  GOAL:   Patient will meet greater than or equal to 90% of their needs - progressing  MONITOR:   PO intake, Labs, Weight trends  REASON FOR ASSESSMENT:   Ventilator, Consult Enteral/tube feeding initiation and management  ASSESSMENT:   67 year old male who presented to the ED on 2/02 with SOB, chest pain, and AMS. PMH of COPD, ETOH abuse, cocaine abuse, noncompliance, DM, PVD s/p L BKA. Pt required intubation. Pt admitted with acute respiratory failure, hyponatremia.  2/3: extubated 2/4: s/p BSE- recommend dysphagia 3, nectar thick liquids  2/10: s/p MBS-continue dysphagia 3, liquids adv to thin  Pt remains stable for discharge. Pending SNF placement.   Pt displeased with dysphagia 3 diet, continues to request crackers and non-compliant foods. Tried to explain importance of continuing recommended diet for pt safety. Overall pt eating well with no other complaints and continues to consume Nepro shake supplements as ordered.   Meal completions: 2/16: 100% breakfast 2/17: 50% x 3 meals 2/18: 100% breakfast   Admit weight: 59 kg Current weight: 63.9 kg  Medications: colace, folvite, MVI, protonix, miralax, thiamine, IV Mg sulfate  Labs (2/19): sodium 133, BUN 7, Cr 0.53, Mg 1.6  Diet Order:   Diet Order             DIET DYS 3 Room service appropriate? No; Fluid consistency: Thin  Diet effective now                   EDUCATION NEEDS:   Not appropriate for education at this time  Skin:  Skin Assessment: Skin Integrity  Issues: Skin Integrity Issues:: Stage II Stage II: L elbow, L ischial tuberosity  Last BM:  2/19 (type 4)  Height:   Ht Readings from Last 1 Encounters:  10/15/22 5' 7"$  (1.702 m)    Weight:   Wt Readings from Last 1 Encounters:  10/30/22 63.9 kg   BMI:  Body mass index is 22.06 kg/m.  Estimated Nutritional Needs:   Kcal:  1850-2050  Protein:  85-105 grams  Fluid:  1.8-2.0 L  Clayborne Dana, RDN, LDN Clinical Nutrition

## 2022-11-03 DIAGNOSIS — J9601 Acute respiratory failure with hypoxia: Secondary | ICD-10-CM | POA: Diagnosis not present

## 2022-11-03 DIAGNOSIS — J1569 Pneumonia due to other gram-negative bacteria: Secondary | ICD-10-CM | POA: Diagnosis not present

## 2022-11-03 DIAGNOSIS — E871 Hypo-osmolality and hyponatremia: Secondary | ICD-10-CM | POA: Diagnosis not present

## 2022-11-03 NOTE — TOC Progression Note (Signed)
Transition of Care [facility]) - Progression Note    Patient Details  Name: Ronald [name] MRN: [id number] Date of Birth: 09/22/1955  Transition of Care [facility]) CM/SW Contact  Joanne Chars, LCSW Phone Number: 11/03/2022, 3:49 PM  Clinical Narrative:   CSW spoke with Sheila/[facility] regarding possible SNF DTP placement.  She is unable to accept due to active substance use.  CSW messaged Tammy/[facility] regarding DTP placement.  She will review.      Expected Discharge Plan: [place] Barriers to Discharge: Continued Medical Work up  Expected Discharge Plan and Services In-house Referral: Clinical Social Work     Living arrangements for the past 2 months: Single Family Home                                       Social Determinants of Health (SDOH) Interventions SDOH Screenings   Food Insecurity: No Food Insecurity (10/17/2022)  Recent Concern: Food Insecurity - Food Insecurity Present (09/17/2022)  Housing: Medium Risk (10/17/2022)  Transportation Needs: Unmet Transportation Needs (10/17/2022)  Utilities: Not At Risk (10/17/2022)  Tobacco Use: High Risk (10/15/2022)    Readmission Risk Interventions     No data to display

## 2022-11-03 NOTE — Progress Notes (Signed)
PROGRESS NOTE  [name] PX:[id number] DOB: 24-Jun-1956 DOA: 10/15/2022 PCP: Demetrios Isaacs, FNP   LOS: 19 days   Brief Narrative / Interim history: 67 year old male with with prior polysubstance abuse, EtOH, cocaine, noncompliance, right heel unstageable pressure injury, PVD who comes into the hospital with altered mental status.  He was recently admitted and discharged home as he refused SNF, and came back to the hospital confused, hypoxic, hypercarbic and noted to be aspirating.  He was initially required to the ICU requiring intubation.  Eventually extubated on 2/3 and transferred to [facility] on 2/5.  Currently stable, awaiting SNF  Subjective / 24h Interval events: Worked with PT this AM  Assesement and Plan: Principal Problem:   Respiratory failure ([place]) Active Problems:   Alcohol abuse   Acute hypoxic and hypercarbic respiratory failure -initially requiring intubation, eventually extubated and now he is stable on room air.   Proteus mirabilis pneumonia -finished Augmentin, status post 8 days  Acute on Chronic Hyponatremia in the setting of beer potomania - On admission serum sodium 112, currently in the upper 120s to low 130s which  is his baseline   Dysphagia  -advanced to thin liquids -aspiration precautions   Hypokalemia, hypomagnesemia -continue to monitor and replete as indicated  Prediabetes, blood sugars are well-controlled - A1c 5.6 on 09/02/2022.   Chronic EtOH abuse with concern for withdrawal, cocaine abuse - UDS positive for cocaine on 09/20/2022. Continue folic acid supplement, thiamine supplement, multivitamins, out of window for alcohol withdrawal.   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 - Continue home regimen   Unstageable right heel pressure injury, POA - Continue local wound care  -unfortunately patient is not interested in following the advice of keeping heel off bed/NWB   Right elbow swelling- Cellulitis vs thrombophlebitis  -no fracture,  resolved    Scheduled Meds:  docusate  100 mg Oral BID   feeding supplement (NEPRO CARB STEADY)  237 mL Oral Q24H   fluticasone furoate-vilanterol  1 puff Inhalation Daily   folic acid  1 mg Oral Daily   heparin  5,000 Units Subcutaneous Q8H   magnesium oxide  400 mg Oral Daily   multivitamin with minerals  1 tablet Oral Daily   pantoprazole  40 mg Oral Daily   polyethylene glycol  17 g Oral Daily   thiamine  100 mg Oral Daily   Continuous Infusions: PRN Meds:.albuterol, HYDROcodone-acetaminophen, mouth rinse, polyethylene glycol  Current Outpatient Medications  Medication Instructions   albuterol (VENTOLIN HFA) 108 (90 Base) MCG/ACT inhaler 2 puffs, Inhalation, Every 6 hours PRN   amLODipine (NORVASC) 10 mg, Oral, Daily   fluticasone furoate-vilanterol (BREO ELLIPTA) 100-25 MCG/ACT AEPB 1 puff, Inhalation, Daily   folic acid (FOLVITE) 1 mg, Oral, Daily   guaiFENesin-dextromethorphan (ROBITUSSIN DM) 100-10 MG/5ML syrup 10 mLs, Oral, Every 4 hours PRN   lisinopril (ZESTRIL) 10 mg, Oral, Daily   Multiple Vitamin (MULTIVITAMIN WITH MINERALS) TABS tablet 1 tablet, Oral, Daily   pantoprazole (PROTONIX) 40 mg, Oral, Daily   thiamine (VITAMIN B1) 100 mg, Oral, Daily    Diet Orders (From admission, onward)     Start     Ordered   10/23/22 1420  DIET DYS 3 Room service appropriate? No; Fluid consistency: Thin  Diet effective now       Question Answer Comment  Room service appropriate? No   Fluid consistency: Thin      10/23/22 1419            DVT prophylaxis: heparin  injection 5,000 Units Start: 10/15/22 0930   Lab Results  Component Value Date   PLT 472 (H) 11/01/2022      Code Status: Full Code  Family Communication: no family at bedside   Status is: Inpatient  Remains inpatient appropriate because: awaiting placement.  Level of care: Med-Surg  Consultants:  PCCM  Objective: Vitals:   11/02/22 2010 11/03/22 0500 11/03/22 0801 11/03/22 0850  BP: (!)  163/87   109/74  Pulse: 99   (!) 101  Resp: 18   18  Temp: 98.7 F (37.1 C)   97.9 F (36.6 C)  TempSrc: Oral   Oral  SpO2: 100%  98%   Weight: 64.5 kg 64.6 kg    Height:        Intake/Output Summary (Last 24 hours) at 11/03/2022 1041 Last data filed at 11/03/2022 0600 Gross per 24 hour  Intake 2040 ml  Output 3250 ml  Net -1210 ml   Wt Readings from Last 3 Encounters:  11/03/22 64.6 kg  09/23/22 58.8 kg  09/17/22 56.8 kg    Examination:  Constitutional: NAD Respiratory: CTA Cardiovascular: RRR  Data Reviewed: I have independently reviewed following labs and imaging studies   CBC Recent Labs  Lab 11/01/22 0458  WBC 7.1  HGB 9.7*  HCT 29.1*  PLT 472*  MCV 96.0  MCH 32.0  MCHC 33.3  RDW 16.7*    Recent Labs  Lab 11/01/22 0458  NA 133*  K 3.9  CL 93*  CO2 28  GLUCOSE 117*  BUN 7*  CREATININE 0.53*  CALCIUM 9.3  AST 16  ALT 13  ALKPHOS 55  BILITOT 0.4  ALBUMIN 3.4*  MG 1.6*    ------------------------------------------------------------------------------------------------------------------ No results for input(s): "CHOL", "HDL", "LDLCALC", "TRIG", "CHOLHDL", "LDLDIRECT" in the last 72 hours.  Lab Results  Component Value Date   HGBA1C 5.6 09/02/2022   ------------------------------------------------------------------------------------------------------------------ No results for input(s): "TSH", "T4TOTAL", "T3FREE", "THYROIDAB" in the last 72 hours.  Invalid input(s): "FREET3"  Cardiac Enzymes No results for input(s): "CKMB", "TROPONINI", "MYOGLOBIN" in the last 168 hours.  Invalid input(s): "CK" ------------------------------------------------------------------------------------------------------------------    Component Value Date/Time   BNP 71.0 10/15/2022 0636    CBG: Recent Labs  Lab 10/27/22 1610 10/27/22 2011 10/28/22 0801 10/28/22 1557 10/29/22 2157  GLUCAP 107* 124* 103* 114* 101*    No results found for this or any  previous visit (from the past 240 hour(s)).   Radiology Studies: No results found.   Eulogio Bear DO [facility]  Between 7 am - 7 pm I am available, please contact me via Amion (for emergencies) or Securechat (non urgent messages)  Between 7 pm - 7 am I am not available, please contact night coverage MD/APP via Amion

## 2022-11-03 NOTE — Progress Notes (Signed)
Occupational Therapy Treatment Patient Details Name: Ronald [name] MRN: [id number] DOB: 07-28-1956 Today's Date: 11/03/2022   History of present illness This is a 67 year old gentleman admitted 2/2 after being found down at home after a recent d/c from hospital where he refused SNF. Pt was hyponatremic hypoxemic hypercarbic was placed on BiPAP in the emergency room. Ultimately failed BiPAP requiring intubation mechanical ventilation 2/2--2/3. 2/6: PTA discussed with DO pt R heel ulcer (POA) and pt NWB as of 2/7. PMH: polysubstance abuse, cocaine, alcohol use, R heel pressure injury.   OT comments  Pt continues with slow progress towards OT goals; continued same goals due to slow progress and few attempts at transfers. Pt agreeable to LB dressing and UE there-ex this session; demonstrating improved overall UE strength and ability to perform LB ADL. Pt continues with poor maintenance of NWB status during any mobility. Pt deferred OOB transfer this session. Continue to recommend SNF for OT to optimize safety and independence in ADL and IADL.    Recommendations for follow up therapy are one component of a multi-disciplinary discharge planning process, led by the attending physician.  Recommendations may be updated based on patient status, additional functional criteria and insurance authorization.    Follow Up Recommendations  Skilled nursing-short term rehab (<3 hours/day)     Assistance Recommended at Discharge Frequent or constant Supervision/Assistance  Patient can return home with the following  A little help with walking and/or transfers;A little help with bathing/dressing/bathroom;Assistance with cooking/housework;Direct supervision/assist for financial management;Direct supervision/assist for medications management;Help with stairs or ramp for entrance   Equipment Recommendations  None recommended by OT    Recommendations for Other Services      Precautions / Restrictions  Precautions Precautions: Fall Precaution Comments: 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     Balance Overall balance assessment: Needs assistance Sitting-balance support: Bilateral upper extremity supported Sitting balance-Leahy Scale: Fair                                     ADL either performed or assessed with clinical judgement   ADL Overall ADL's : Needs assistance/impaired                     Lower Body Dressing: Moderate assistance;Bed level Lower Body Dressing Details (indicate cue type and reason): Donning pants with mod A; poor adherence to NWB precautions                    Extremity/Trunk Assessment Upper Extremity Assessment Upper Extremity Assessment: Generalized weakness   Lower Extremity Assessment Lower Extremity Assessment: Defer to PT evaluation        Vision   Vision Assessment?: No apparent visual deficits   Perception     Praxis      Cognition Arousal/Alertness: Awake/alert Behavior During Therapy: Agitated Overall Cognitive Status: No family/caregiver  present to determine baseline cognitive functioning Area of Impairment: Problem solving, Following commands, Attention, Awareness                   Current Attention Level: Selective   Following Commands: Follows one step commands with increased time, Follows one step commands inconsistently   Awareness: Intellectual Problem Solving: Slow processing, Requires verbal cues,  Requires tactile cues, Decreased initiation General Comments: pt is upset about his situation but is not a good reporter of actual issues nor does he allow explanations for tx to be given. Agitated that therapists offer him to get in the chair, poor awareness of reason offered to get into chair and refuses to accept any education        Exercises Exercises: Other exercises Other Exercises Other Exercises: BUE shoulder flexion 10x, chair push ups 5x Other Exercises: shoulder flexion with Red theraband L and R 10x ea . Elbow flexion with red theraband L and R 10x ea Other Exercises: therapy squeeze ball 10x    Shoulder Instructions       General Comments easily agitated when offered to get to the chair, but was agreeable to ther ex    Pertinent Vitals/ Pain       Pain Assessment Pain Assessment: Faces Faces Pain Scale: Hurts little more Pain Location: R foot Pain Descriptors / Indicators: Grimacing, Guarding Pain Intervention(s): Monitored during session  Home Living                                          Prior Functioning/Environment              Frequency  Min 2X/week        Progress Toward Goals  OT Goals(current goals can now be found in the care plan section)  Progress towards OT goals: Progressing toward goals  Acute Rehab OT Goals Patient Stated Goal: eat crackers OT Goal Formulation: With patient Time For Goal Achievement: 11/17/22 Potential to Achieve Goals: Good ADL Goals Pt Will Perform Lower Body Dressing: with supervision;sitting/lateral leans Pt Will Transfer to Toilet: with min guard assist;anterior/posterior transfer Pt Will Perform Toileting - Clothing Manipulation and hygiene: with supervision;sitting/lateral leans Pt/caregiver will Perform Home Exercise Program: Increased strength;Right Upper extremity;Left upper extremity;Both right and left upper extremity;With written HEP provided Additional ADL Goal #1: Pt will transfer  to wheelchair while maintaining WB precautions with min guard A.  Plan Discharge plan remains appropriate    Co-evaluation                 AM-PAC OT "6 Clicks" Daily Activity     Outcome Measure   Help from another person eating meals?: None Help from another person taking care of personal grooming?: A Little Help from another person toileting, which includes using toliet, bedpan, or urinal?: A Little Help from another person bathing (including washing, rinsing, drying)?: A Little Help from another person to put on and taking off regular upper body clothing?: A Little Help from another person to put on and taking off regular lower body clothing?: A Lot 6 Click Score: 18    End of Session    OT Visit Diagnosis: Unsteadiness on feet (R26.81);Other abnormalities of gait and mobility (R26.89);Muscle weakness (generalized) (M62.81)   Activity Tolerance Patient tolerated treatment well   Patient Left in bed;with call bell/phone within reach;with bed alarm set   Nurse Communication Mobility  status        Time: [id number] OT Time Calculation (min): 18 min  Charges: OT General Charges $OT Visit: 1 Visit OT Treatments $Therapeutic Exercise: 8-22 mins  Elder Cyphers, OTR/L [facility] Acute Rehabilitation Office: [phone]   Magnus Ivan 11/03/2022, 5:33 PM

## 2022-11-03 NOTE — Progress Notes (Signed)
Orthopedic Tech Progress Note Patient Details:  [name] Feb 08, 1956 [id number] Due to patient having a heel injury our onsite Darco Shoe would not work. [facility] has been contacted to bring an Administrator, [employer].  Patient ID: Rusty Aus, male   DOB: Apr 05, 1956, 67 y.o.   MRN: [id number]  Linus Salmons Jiovanna Frei 11/03/2022, 11:00 AM

## 2022-11-03 NOTE — Progress Notes (Signed)
Physical Therapy Treatment Patient Details Name: Ronald [name] MRN: [id number] DOB: 03-09-1956 Today's Date: 11/03/2022   History of Present Illness This is a 67 year old gentleman admitted 2/2 after being found down at home after a recent d/c from hospital where he refused SNF. Pt was hyponatremic hypoxemic hypercarbic was placed on BiPAP in the emergency room. Ultimately failed BiPAP requiring intubation mechanical ventilation 2/2--2/3. 2/6: PTA discussed with DO pt R heel ulcer (POA) and pt NWB as of 2/7. PMH: polysubstance abuse, cocaine, alcohol use, R heel pressure injury.    PT Comments    Pt was seen for mobility on bed and to work on LE strength and ROM.  Pt is a bit agitated, but can perform the exercises.  He suddenly became more resistant, and declined to allow PT to give the rationale for therapy routine.  He is going to be sent to SNF upon dc, but have asked MD to consider a Darco shoe to unload R heel and try to increase his ability to take charge of transfers.  Follow acutely for goals of PT as are outlined below.  Recommendations for follow up therapy are one component of a multi-disciplinary discharge planning process, led by the attending physician.  Recommendations may be updated based on patient status, additional functional criteria and insurance authorization.  Follow Up Recommendations  Skilled nursing-short term rehab (<3 hours/day) Can patient physically be transported by private vehicle: No   Assistance Recommended at Discharge Frequent or constant Supervision/Assistance  Patient can return home with the following A lot of help with bathing/dressing/bathroom;Assistance with cooking/housework;Direct supervision/assist for medications management;Direct supervision/assist for financial management;Assist for transportation;Help with stairs or ramp for entrance   Equipment Recommendations  Wheelchair (measurements PT);Wheelchair cushion (measurements PT);Other (comment)     Recommendations for Other Services OT consult     Precautions / Restrictions Precautions Precautions: Fall Precaution Comments: 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) and large R heel wound (prevalon boot) Required Braces or Orthoses:  (has prevalon and will refuse to use it) Restrictions RLE Weight Bearing: Non weight bearing Other Position/Activity Restrictions: have asked MD to review for potential to use Darco for transfers as pt is resistant to scoot transfers     Mobility  Bed Mobility Overal bed mobility: Needs Assistance Bed Mobility: Rolling Rolling: Supervision   Supine to sit: Supervision Sit to supine: Supervision        Transfers Overall transfer level: Needs assistance                 General transfer comment: declined    Ambulation/Gait                   Stairs             Wheelchair Mobility    Modified Rankin (Stroke Patients Only)       Balance Overall balance assessment: Needs assistance Sitting-balance support: Bilateral upper extremity supported Sitting balance-Leahy Scale: Fair                                      Cognition Arousal/Alertness: Awake/alert Behavior During Therapy: Agitated Overall Cognitive Status: No family/caregiver present to determine baseline cognitive functioning Area of Impairment: Problem solving, Following commands, Attention                   Current Attention Level: Selective Memory: Decreased short-term memory, Decreased recall of precautions Following Commands:  Follows one step commands with increased time, Follows one step commands inconsistently Safety/Judgement: Decreased awareness of deficits Awareness: Intellectual Problem Solving: Slow processing, Requires verbal cues, Requires tactile cues, Decreased initiation General Comments: pt is upset about his situation but is not a good reporter of actual issues nor does he allow explanations for tx to be  given        Exercises Total Joint Exercises Quad Sets: AROM, Both General Exercises - Lower Extremity Heel Slides: AROM, 10 reps    General Comments General comments (skin integrity, edema, etc.): Pt is easily agitated, mainly over his understanding of rationale for PT.  Does not listen to explanations, so is unable to be reasoned with well.  Note his control of movement is fairly good, and should be better able to do transfers with equpment and maybe his perception that he is driving the session      Pertinent Vitals/Pain Pain Assessment Pain Assessment: Faces Faces Pain Scale: Hurts little more Pain Location: R foot Pain Descriptors / Indicators: Grimacing, Guarding Pain Intervention(s): Limited activity within patient's tolerance, Monitored during session, Premedicated before session, Repositioned    Home Living                          Prior Function            PT Goals (current goals can now be found in the care plan section) Acute Rehab PT Goals Patient Stated Goal: To go home Progress towards PT goals: Not progressing toward goals - comment    Frequency    Min 3X/week      PT Plan Current plan remains appropriate    Co-evaluation              AM-PAC PT "6 Clicks" Mobility   Outcome Measure  Help needed turning from your back to your side while in a flat bed without using bedrails?: A Little Help needed moving from lying on your back to sitting on the side of a flat bed without using bedrails?: A Little Help needed moving to and from a bed to a chair (including a wheelchair)?: A Little Help needed standing up from a chair using your arms (e.g., wheelchair or bedside chair)?: Total Help needed to walk in hospital room?: Total Help needed climbing 3-5 steps with a railing? : Total 6 Click Score: 12    End of Session   Activity Tolerance: Treatment limited secondary to agitation Patient left: in bed;with call bell/phone within reach;with  bed alarm set Nurse Communication: Mobility status PT Visit Diagnosis: Other abnormalities of gait and mobility (R26.89);Muscle weakness (generalized) (M62.81);History of falling (Z91.81)     Time: [id number] PT Time Calculation (min) (ACUTE ONLY): 10 min  Charges:  $Therapeutic Exercise: 8-22 mins Ramond Dial 11/03/2022, 9:23 AM  Mee Hives, PT PhD Acute Rehab Dept. Number: [place] [facility] [place]

## 2022-11-04 DIAGNOSIS — F1010 Alcohol abuse, uncomplicated: Secondary | ICD-10-CM | POA: Diagnosis not present

## 2022-11-04 DIAGNOSIS — J9601 Acute respiratory failure with hypoxia: Secondary | ICD-10-CM | POA: Diagnosis not present

## 2022-11-04 DIAGNOSIS — E871 Hypo-osmolality and hyponatremia: Secondary | ICD-10-CM | POA: Diagnosis not present

## 2022-11-04 NOTE — Progress Notes (Signed)
PROGRESS NOTE  [name] [id number] DOB: [birth year] DOA: 10/15/2022 PCP: Demetrios Isaacs, FNP   LOS: 20 days   Brief Narrative / Interim history: 67 year old male with with prior polysubstance abuse, EtOH, cocaine, noncompliance, right heel unstageable pressure injury, PVD who comes into the hospital with altered mental status.  He was recently admitted and discharged home as he refused SNF, and came back to the hospital confused, hypoxic, hypercarbic and noted to be aspirating.  He was initially required to the ICU requiring intubation.  Eventually extubated on 2/3 and transferred to [facility] on 2/5.  Currently stable, awaiting SNF  Subjective / 24h Interval events: No current complaints  Assesement and Plan: Principal Problem:   Respiratory failure ([place]) Active Problems:   Alcohol abuse   Acute hypoxic and hypercarbic respiratory failure -initially requiring intubation, eventually extubated and now he is stable on room air.  Proteus mirabilis pneumonia -finished Augmentin, status post 8 days  Acute on Chronic Hyponatremia in the setting of beer potomania - On admission serum sodium 112, currently in the upper 120s to low 130s which  is his baseline   Dysphagia  -advanced to thin liquids -aspiration precautions   Hypokalemia, hypomagnesemia -continue to monitor and replete as indicated  Prediabetes, blood sugars are well-controlled - A1c 5.6 on 09/02/2022.   Chronic EtOH abuse with concern for withdrawal, cocaine abuse - UDS positive for cocaine on 09/20/2022. Continue folic acid supplement, thiamine supplement, multivitamins, out of window for alcohol withdrawal.   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 - Continue home regimen   Unstageable right heel pressure injury, POA - Continue local wound care  -unfortunately patient is not interested in following the advice of keeping heel off bed/NWB   Right elbow swelling- Cellulitis vs thrombophlebitis  -no fracture,  resolved    Scheduled Meds:  docusate  100 mg Oral BID   feeding supplement (NEPRO CARB STEADY)  237 mL Oral Q24H   fluticasone furoate-vilanterol  1 puff Inhalation Daily   folic acid  1 mg Oral Daily   heparin  5,000 Units Subcutaneous Q8H   magnesium oxide  400 mg Oral Daily   multivitamin with minerals  1 tablet Oral Daily   pantoprazole  40 mg Oral Daily   polyethylene glycol  17 g Oral Daily   thiamine  100 mg Oral Daily   Continuous Infusions: PRN Meds:.albuterol, HYDROcodone-acetaminophen, mouth rinse, polyethylene glycol  Current Outpatient Medications  Medication Instructions   albuterol (VENTOLIN HFA) 108 (90 Base) MCG/ACT inhaler 2 puffs, Inhalation, Every 6 hours PRN   amLODipine (NORVASC) 10 mg, Oral, Daily   fluticasone furoate-vilanterol (BREO ELLIPTA) 100-25 MCG/ACT AEPB 1 puff, Inhalation, Daily   folic acid (FOLVITE) 1 mg, Oral, Daily   guaiFENesin-dextromethorphan (ROBITUSSIN DM) 100-10 MG/5ML syrup 10 mLs, Oral, Every 4 hours PRN   lisinopril (ZESTRIL) 10 mg, Oral, Daily   Multiple Vitamin (MULTIVITAMIN WITH MINERALS) TABS tablet 1 tablet, Oral, Daily   pantoprazole (PROTONIX) 40 mg, Oral, Daily   thiamine (VITAMIN B1) 100 mg, Oral, Daily    Diet Orders (From admission, onward)     Start     Ordered   10/23/22 1420  DIET DYS 3 Room service appropriate? No; Fluid consistency: Thin  Diet effective now       Question Answer Comment  Room service appropriate? No   Fluid consistency: Thin      10/23/22 1419            DVT prophylaxis: heparin injection 5,000 Units  Start: 10/15/22 0930   Lab Results  Component Value Date   PLT 472 (H) 11/01/2022      Code Status: Full Code  Family Communication: no family at bedside   Status is: Inpatient  Remains inpatient appropriate because: awaiting placement.  Level of care: Med-Surg  Consultants:  PCCM  Objective: Vitals:   11/04/22 0500 11/04/22 0607 11/04/22 0835 11/04/22 0843  BP:  (!)  143/85  112/72  Pulse:  93  99  Resp:    18  Temp:    98.4 F (36.9 C)  TempSrc:    Oral  SpO2:  99% 97% 100%  Weight: 65.2 kg     Height:        Intake/Output Summary (Last 24 hours) at 11/04/2022 1027 Last data filed at 11/04/2022 0400 Gross per 24 hour  Intake 480 ml  Output 2400 ml  Net -1920 ml   Wt Readings from Last 3 Encounters:  11/04/22 65.2 kg  09/23/22 58.8 kg  09/17/22 56.8 kg    Examination:  Constitutional: NAD Respiratory: CTA Cardiovascular: RRR  Data Reviewed: I have independently reviewed following labs and imaging studies   CBC Recent Labs  Lab 11/01/22 0458  WBC 7.1  HGB 9.7*  HCT 29.1*  PLT 472*  MCV 96.0  MCH 32.0  MCHC 33.3  RDW 16.7*    Recent Labs  Lab 11/01/22 0458  NA 133*  K 3.9  CL 93*  CO2 28  GLUCOSE 117*  BUN 7*  CREATININE 0.53*  CALCIUM 9.3  AST 16  ALT 13  ALKPHOS 55  BILITOT 0.4  ALBUMIN 3.4*  MG 1.6*    ------------------------------------------------------------------------------------------------------------------ No results for input(s): "CHOL", "HDL", "LDLCALC", "TRIG", "CHOLHDL", "LDLDIRECT" in the last 72 hours.  Lab Results  Component Value Date   HGBA1C 5.6 09/02/2022   ------------------------------------------------------------------------------------------------------------------ No results for input(s): "TSH", "T4TOTAL", "T3FREE", "THYROIDAB" in the last 72 hours.  Invalid input(s): "FREET3"  Cardiac Enzymes No results for input(s): "CKMB", "TROPONINI", "MYOGLOBIN" in the last 168 hours.  Invalid input(s): "CK" ------------------------------------------------------------------------------------------------------------------    Component Value Date/Time   BNP 71.0 10/15/2022 0636    CBG: Recent Labs  Lab 10/28/22 1557 10/29/22 2157  GLUCAP 114* 101*    No results found for this or any previous visit (from the past 240 hour(s)).   Radiology Studies: No results  found.   Eulogio Bear DO [facility]  Between 7 am - 7 pm I am available, please contact me via Amion (for emergencies) or Securechat (non urgent messages)  Between 7 pm - 7 am I am not available, please contact night coverage MD/APP via Amion

## 2022-11-05 DIAGNOSIS — J9601 Acute respiratory failure with hypoxia: Secondary | ICD-10-CM | POA: Diagnosis not present

## 2022-11-05 NOTE — Progress Notes (Addendum)
PT Cancellation Note  Patient Details Name: [name] MRN: [id number] DOB: May 03, 1956   Cancelled Treatment:    Reason Eval/Treat Not Completed: (P) Patient declined, no reason specified (Pt initially agreed to session, then became increasingly agitated and stated "leave me alone".  Will continue to follow as schedule allows.)  PT Addendum: Pt has remained Min A for transfers from evaluation on 2/3 to final treatment session when pt was agreeable to transfer on 2/17. Pt made no progress in 2 weeks and is not agreeable to trying different techniques in order to increase independence. Pt has refused mobility attempts to get out of bed 4 out of 10 treatment sessions. Due to pt non-compliance and lack of progress the services pt is allowing at this time are not skilled physical therapy and pt will be discharged from skilled physical therapy services at this time. Please re-consult when pt is agreeable to participate in skilled physical therapy services. Nursing can perform transfers with pt at [facility] when pt would like to transfer.    Michelle Nasuti 11/05/2022, 2:36 PM  Tomma Rakers, DPT, [place]  Acute Rehabilitation Services Office: [phone] (Secure chat preferred)

## 2022-11-05 NOTE — Progress Notes (Signed)
PROGRESS NOTE  [name] PX:[id number] DOB: September 29, 1955 DOA: 10/15/2022 PCP: Demetrios Isaacs, FNP   LOS: 21 days   Brief Narrative / Interim history: 67 year old male with with prior polysubstance abuse, EtOH, cocaine, noncompliance, right heel unstageable pressure injury, PVD who comes into the hospital with altered mental status.  He was recently admitted and discharged home as he refused SNF, and came back to the hospital confused, hypoxic, hypercarbic and noted to be aspirating.  He was initially required to the ICU requiring intubation.  Eventually extubated on 2/3 and transferred to [facility] on 2/5.  Currently stable, awaiting SNF  Subjective / 24h Interval events: No current complaints  Assesement and Plan: Principal Problem:   Respiratory failure ([place]) Active Problems:   Alcohol abuse   Acute hypoxic and hypercarbic respiratory failure -initially requiring intubation, eventually extubated and now he is stable on room air.  Proteus mirabilis pneumonia -finished Augmentin, status post 8 days  Acute on Chronic Hyponatremia in the setting of beer potomania - On admission serum sodium 112, currently in the upper 120s to low 130s which  is his baseline   Dysphagia  -advanced to thin liquids -aspiration precautions   Hypokalemia, hypomagnesemia -continue to monitor and replete as indicated  Prediabetes, blood sugars are well-controlled - A1c 5.6 on 09/02/2022.   Chronic EtOH abuse with concern for withdrawal, cocaine abuse - UDS positive for cocaine on 09/20/2022. Continue folic acid supplement, thiamine supplement, multivitamins, out of window for alcohol withdrawal.   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 - Continue home regimen   Unstageable right heel pressure injury, POA - Continue local wound care  -unfortunately patient is not interested in following the advice of keeping heel off bed/NWB   Right elbow swelling- Cellulitis vs thrombophlebitis  -no fracture,  resolved    Scheduled Meds:  docusate  100 mg Oral BID   feeding supplement (NEPRO CARB STEADY)  237 mL Oral Q24H   fluticasone furoate-vilanterol  1 puff Inhalation Daily   folic acid  1 mg Oral Daily   heparin  5,000 Units Subcutaneous Q8H   magnesium oxide  400 mg Oral Daily   multivitamin with minerals  1 tablet Oral Daily   pantoprazole  40 mg Oral Daily   polyethylene glycol  17 g Oral Daily   thiamine  100 mg Oral Daily   Continuous Infusions: PRN Meds:.albuterol, HYDROcodone-acetaminophen, mouth rinse, polyethylene glycol  Current Outpatient Medications  Medication Instructions   albuterol (VENTOLIN HFA) 108 (90 Base) MCG/ACT inhaler 2 puffs, Inhalation, Every 6 hours PRN   amLODipine (NORVASC) 10 mg, Oral, Daily   fluticasone furoate-vilanterol (BREO ELLIPTA) 100-25 MCG/ACT AEPB 1 puff, Inhalation, Daily   folic acid (FOLVITE) 1 mg, Oral, Daily   guaiFENesin-dextromethorphan (ROBITUSSIN DM) 100-10 MG/5ML syrup 10 mLs, Oral, Every 4 hours PRN   lisinopril (ZESTRIL) 10 mg, Oral, Daily   Multiple Vitamin (MULTIVITAMIN WITH MINERALS) TABS tablet 1 tablet, Oral, Daily   pantoprazole (PROTONIX) 40 mg, Oral, Daily   thiamine (VITAMIN B1) 100 mg, Oral, Daily    Diet Orders (From admission, onward)     Start     Ordered   10/23/22 1420  DIET DYS 3 Room service appropriate? No; Fluid consistency: Thin  Diet effective now       Question Answer Comment  Room service appropriate? No   Fluid consistency: Thin      10/23/22 1419            DVT prophylaxis: heparin injection 5,000 Units  Start: 10/15/22 0930   Lab Results  Component Value Date   PLT 472 (H) 11/01/2022      Code Status: Full Code  Family Communication: no family at bedside   Status is: Inpatient  Remains inpatient appropriate because: awaiting placement.  Level of care: Med-Surg  Consultants:  PCCM  Objective: Vitals:   11/04/22 1902 11/05/22 0355 11/05/22 0500 11/05/22 0857  BP: 128/73  128/83    Pulse: (!) 102 100    Resp:  18    Temp: 98.6 F (37 C) 98 F (36.7 C)    TempSrc: Oral     SpO2: 100% 98%  98%  Weight:   61.5 kg   Height:        Intake/Output Summary (Last 24 hours) at 11/05/2022 1031 Last data filed at 11/05/2022 0403 Gross per 24 hour  Intake 474 ml  Output 2650 ml  Net -2176 ml   Wt Readings from Last 3 Encounters:  11/05/22 61.5 kg  09/23/22 58.8 kg  09/17/22 56.8 kg    Examination:   General: Appearance:    Well developed, well nourished male in no acute distress          Data Reviewed: I have independently reviewed following labs and imaging studies   CBC Recent Labs  Lab 11/01/22 0458  WBC 7.1  HGB 9.7*  HCT 29.1*  PLT 472*  MCV 96.0  MCH 32.0  MCHC 33.3  RDW 16.7*    Recent Labs  Lab 11/01/22 0458  NA 133*  K 3.9  CL 93*  CO2 28  GLUCOSE 117*  BUN 7*  CREATININE 0.53*  CALCIUM 9.3  AST 16  ALT 13  ALKPHOS 55  BILITOT 0.4  ALBUMIN 3.4*  MG 1.6*    ------------------------------------------------------------------------------------------------------------------ No results for input(s): "CHOL", "HDL", "LDLCALC", "TRIG", "CHOLHDL", "LDLDIRECT" in the last 72 hours.  Lab Results  Component Value Date   HGBA1C 5.6 09/02/2022   ------------------------------------------------------------------------------------------------------------------ No results for input(s): "TSH", "T4TOTAL", "T3FREE", "THYROIDAB" in the last 72 hours.  Invalid input(s): "FREET3"  Cardiac Enzymes No results for input(s): "CKMB", "TROPONINI", "MYOGLOBIN" in the last 168 hours.  Invalid input(s): "CK" ------------------------------------------------------------------------------------------------------------------    Component Value Date/Time   BNP 71.0 10/15/2022 0636    CBG: Recent Labs  Lab 10/29/22 2157  GLUCAP 101*    No results found for this or any previous visit (from the past 240 hour(s)).   Radiology  Studies: No results found.   Eulogio Bear DO [facility]  Between 7 am - 7 pm I am available, please contact me via Amion (for emergencies) or Securechat (non urgent messages)  Between 7 pm - 7 am I am not available, please contact night coverage MD/APP via Amion

## 2022-11-05 NOTE — Progress Notes (Signed)
Rx paged for replacement Breo.

## 2022-11-06 DIAGNOSIS — J9601 Acute respiratory failure with hypoxia: Secondary | ICD-10-CM | POA: Diagnosis not present

## 2022-11-06 DIAGNOSIS — Z720 Tobacco use: Secondary | ICD-10-CM

## 2022-11-06 MED ORDER — ACETAMINOPHEN 325 MG PO TABS
650.0000 mg | ORAL_TABLET | Freq: Four times a day (QID) | ORAL | Status: DC | PRN
  Administered 2022-11-06 – 2022-11-14 (×3): 650 mg via ORAL
  Filled 2022-11-06 (×4): qty 2

## 2022-11-06 MED ORDER — AMOXICILLIN-POT CLAVULANATE 875-125 MG PO TABS
1.0000 | ORAL_TABLET | Freq: Two times a day (BID) | ORAL | Status: DC
  Administered 2022-11-06 – 2022-11-07 (×4): 1 via ORAL
  Filled 2022-11-06 (×4): qty 1

## 2022-11-06 MED ORDER — MAGNESIUM SULFATE 2 GM/50ML IV SOLN: 2.0000 g | Freq: Once | INTRAVENOUS | Status: DC

## 2022-11-06 MED ORDER — IBUPROFEN 200 MG PO TABS
600.0000 mg | ORAL_TABLET | Freq: Once | ORAL | Status: AC
  Administered 2022-11-06: 600 mg via ORAL
  Filled 2022-11-06: qty 3

## 2022-11-06 NOTE — Progress Notes (Signed)
Agreeable for dressing change, refused to wear Prevalon boot to right heel.     11/06/22 1500  Pressure Injury 10/15/22 Heel Right Unstageable - Full thickness tissue loss in which the base of the injury is covered by slough (yellow, tan, gray, green or brown) and/or eschar (tan, brown or black) in the wound bed.  Date First Assessed/Time First Assessed: 10/15/22 2000   Location: Heel  Location Orientation: Right  Staging: Unstageable - Full thickness tissue loss in which the base of the injury is covered by slough (yellow, tan, gray, green or brown) and/or esc...  Dressing Type Gauze (Comment);Compression wrap  Dressing (S)  Changed;Clean;Dry  State of Healing Non-healing  Site / Wound Assessment Yellow;Pale  % Wound base Yellow/Fibrinous Exudate 40%  % Wound base Black/Eschar 60%  Wound Length (cm) 6 cm  Wound Width (cm) 6 cm  Wound Depth (cm) 1 cm  Wound Surface Area (cm^2) 36 cm^2  Wound Volume (cm^3) 36 cm^3  Margins Unattached edges (unapproximated)  Drainage Amount Moderate  Drainage Description Purulent;Odor - foul  Treatment Cleansed

## 2022-11-06 NOTE — Progress Notes (Signed)
PROGRESS NOTE  [name] PX:[id number] DOB: 08/07/1956 DOA: 10/15/2022 PCP: Demetrios Isaacs, FNP   LOS: 22 days   Brief Narrative / Interim history: 67 year old male with with prior polysubstance abuse, EtOH, cocaine, noncompliance, right heel unstageable pressure injury, PVD who comes into the hospital with altered mental status.  He was recently admitted and discharged home as he refused SNF, and came back to the hospital confused, hypoxic, hypercarbic and noted to be aspirating.  He was initially required to the ICU requiring intubation.  Eventually extubated on 2/3 and transferred to [facility] on 2/5.  Currently stable, awaiting SNF  Subjective / 24h Interval events: C/o right jaw pain-- says he has an abscess   Assesement and Plan: Principal Problem:   Respiratory failure ([place]) Active Problems:   Alcohol abuse  Right jaw pain -will get CT scan  Acute hypoxic and hypercarbic respiratory failure -initially requiring intubation, eventually extubated and now he is stable on room air.  Proteus mirabilis pneumonia -finished Augmentin, status post 8 days  Acute on Chronic Hyponatremia in the setting of beer potomania - On admission serum sodium 112, currently in the upper 120s to low 130s which  is his baseline   Dysphagia  -advanced to thin liquids -aspiration precautions   Hypokalemia, hypomagnesemia -continue to monitor and replete as indicated  Prediabetes, blood sugars are well-controlled - A1c 5.6 on 09/02/2022.   Chronic EtOH abuse with concern for withdrawal, cocaine abuse - UDS positive for cocaine on 09/20/2022. Continue folic acid supplement, thiamine supplement, multivitamins, out of window for alcohol withdrawal.   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 - Continue home regimen   Unstageable right heel pressure injury, POA - Continue local wound care  -unfortunately patient is not interested in following the advice of keeping heel off bed/NWB   Right elbow  swelling- Cellulitis vs thrombophlebitis  -no fracture, resolved    Scheduled Meds:  amoxicillin-clavulanate  1 tablet Oral Q12H   docusate  100 mg Oral BID   feeding supplement (NEPRO CARB STEADY)  237 mL Oral Q24H   fluticasone furoate-vilanterol  1 puff Inhalation Daily   folic acid  1 mg Oral Daily   heparin  5,000 Units Subcutaneous Q8H   magnesium oxide  400 mg Oral Daily   multivitamin with minerals  1 tablet Oral Daily   pantoprazole  40 mg Oral Daily   polyethylene glycol  17 g Oral Daily   thiamine  100 mg Oral Daily   Continuous Infusions: PRN Meds:.acetaminophen, albuterol, HYDROcodone-acetaminophen, mouth rinse, polyethylene glycol  Current Outpatient Medications  Medication Instructions   albuterol (VENTOLIN HFA) 108 (90 Base) MCG/ACT inhaler 2 puffs, Inhalation, Every 6 hours PRN   amLODipine (NORVASC) 10 mg, Oral, Daily   fluticasone furoate-vilanterol (BREO ELLIPTA) 100-25 MCG/ACT AEPB 1 puff, Inhalation, Daily   folic acid (FOLVITE) 1 mg, Oral, Daily   guaiFENesin-dextromethorphan (ROBITUSSIN DM) 100-10 MG/5ML syrup 10 mLs, Oral, Every 4 hours PRN   lisinopril (ZESTRIL) 10 mg, Oral, Daily   Multiple Vitamin (MULTIVITAMIN WITH MINERALS) TABS tablet 1 tablet, Oral, Daily   pantoprazole (PROTONIX) 40 mg, Oral, Daily   thiamine (VITAMIN B1) 100 mg, Oral, Daily    Diet Orders (From admission, onward)     Start     Ordered   10/23/22 1420  DIET DYS 3 Room service appropriate? No; Fluid consistency: Thin  Diet effective now       Question Answer Comment  Room service appropriate? No   Fluid consistency: Thin  10/23/22 1419            DVT prophylaxis: heparin injection 5,000 Units Start: 10/15/22 0930   Lab Results  Component Value Date   PLT 472 (H) 11/01/2022      Code Status: Full Code  Family Communication: no family at bedside   Status is: Inpatient  Remains inpatient appropriate because: awaiting placement.  Level of care:  Med-Surg  Consultants:  PCCM  Objective: Vitals:   11/05/22 1338 11/05/22 2056 11/06/22 0500 11/06/22 0734  BP: 121/76 119/80  133/75  Pulse: 99 97  97  Resp: '18 17  18  '$ Temp: 98.4 F (36.9 C) 98.3 F (36.8 C)  98 F (36.7 C)  TempSrc: Oral Oral    SpO2: 97% 98%  98%  Weight:   61.5 kg   Height:        Intake/Output Summary (Last 24 hours) at 11/06/2022 1124 Last data filed at 11/06/2022 0900 Gross per 24 hour  Intake 600 ml  Output 3400 ml  Net -2800 ml   Wt Readings from Last 3 Encounters:  11/06/22 61.5 kg  09/23/22 58.8 kg  09/17/22 56.8 kg    Examination:   General: Appearance:    Well developed, well nourished male in no acute distress          Data Reviewed: I have independently reviewed following labs and imaging studies   CBC Recent Labs  Lab 11/01/22 0458  WBC 7.1  HGB 9.7*  HCT 29.1*  PLT 472*  MCV 96.0  MCH 32.0  MCHC 33.3  RDW 16.7*    Recent Labs  Lab 11/01/22 0458  NA 133*  K 3.9  CL 93*  CO2 28  GLUCOSE 117*  BUN 7*  CREATININE 0.53*  CALCIUM 9.3  AST 16  ALT 13  ALKPHOS 55  BILITOT 0.4  ALBUMIN 3.4*  MG 1.6*    ------------------------------------------------------------------------------------------------------------------ No results for input(s): "CHOL", "HDL", "LDLCALC", "TRIG", "CHOLHDL", "LDLDIRECT" in the last 72 hours.  Lab Results  Component Value Date   HGBA1C 5.6 09/02/2022   ------------------------------------------------------------------------------------------------------------------ No results for input(s): "TSH", "T4TOTAL", "T3FREE", "THYROIDAB" in the last 72 hours.  Invalid input(s): "FREET3"  Cardiac Enzymes No results for input(s): "CKMB", "TROPONINI", "MYOGLOBIN" in the last 168 hours.  Invalid input(s): "CK" ------------------------------------------------------------------------------------------------------------------    Component Value Date/Time   BNP 71.0 10/15/2022 0636     CBG: No results for input(s): "GLUCAP" in the last 168 hours.   No results found for this or any previous visit (from the past 240 hour(s)).   Radiology Studies: No results found.   Eulogio Bear DO [facility]  Between 7 am - 7 pm I am available, please contact me via Amion (for emergencies) or Securechat (non urgent messages)  Between 7 pm - 7 am I am not available, please contact night coverage MD/APP via Amion

## 2022-11-07 ENCOUNTER — Inpatient Hospital Stay (HOSPITAL_COMMUNITY): Payer: Medicare Other

## 2022-11-07 DIAGNOSIS — R6884 Jaw pain: Secondary | ICD-10-CM | POA: Diagnosis not present

## 2022-11-07 DIAGNOSIS — J9601 Acute respiratory failure with hypoxia: Secondary | ICD-10-CM | POA: Diagnosis not present

## 2022-11-07 DIAGNOSIS — J1569 Pneumonia due to other gram-negative bacteria: Secondary | ICD-10-CM | POA: Diagnosis not present

## 2022-11-07 MED ORDER — IBUPROFEN 200 MG PO TABS
600.0000 mg | ORAL_TABLET | Freq: Two times a day (BID) | ORAL | Status: AC | PRN
  Administered 2022-11-07: 600 mg via ORAL
  Filled 2022-11-07: qty 3

## 2022-11-07 MED ORDER — IOHEXOL 350 MG/ML SOLN
75.0000 mL | Freq: Once | INTRAVENOUS | Status: AC | PRN
  Administered 2022-11-07: 75 mL via INTRAVENOUS

## 2022-11-07 NOTE — Progress Notes (Signed)
Mobility Specialist - Progress Note   11/07/22 1200  Mobility  Activity Refused mobility    Pt eating lunch upon arrival, stated his jaw is hurting and he wants to stay in bed in preparation for a CT scan today. Will follow up as able.   [place] Specialist Please contact via SecureChat or Rehab office at [phone]

## 2022-11-07 NOTE — Progress Notes (Signed)
PROGRESS NOTE  [name] PX:[id number] DOB: 10/19/1955 DOA: 10/15/2022 PCP: Demetrios Isaacs, FNP   LOS: 23 days   Brief Narrative / Interim history: 67 year old male with with prior polysubstance abuse, EtOH, cocaine, noncompliance, right heel unstageable pressure injury, PVD who comes into the hospital with altered mental status.  He was recently admitted and discharged home as he refused SNF, and came back to the hospital confused, hypoxic, hypercarbic and noted to be aspirating.  He was initially required to the ICU requiring intubation.  Eventually extubated on 2/3 and transferred to [facility] on 2/5.  Currently stable, awaiting SNF  Subjective / 24h Interval events: Awaiting CT scan-- needs IV access for this  Assesement and Plan: Principal Problem:   Respiratory failure ([place]) Active Problems:   Alcohol abuse  Right jaw pain -will get CT scan -PRN ibuprofen -PO abx  Acute hypoxic and hypercarbic respiratory failure -initially requiring intubation, eventually extubated and now he is stable on room air.  Proteus mirabilis pneumonia -finished Augmentin, status post 8 days  Acute on Chronic Hyponatremia in the setting of beer potomania - On admission serum sodium 112, currently in the upper 120s to low 130s which  is his baseline   Dysphagia  -advanced to thin liquids -aspiration precautions   Hypokalemia, hypomagnesemia -continue to monitor and replete as indicated  Prediabetes, blood sugars are well-controlled - A1c 5.6 on 09/02/2022.   Chronic EtOH abuse with concern for withdrawal, cocaine abuse - UDS positive for cocaine on 09/20/2022. Continue folic acid supplement, thiamine supplement, multivitamins, out of window for alcohol withdrawal.   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 - Continue home regimen   Unstageable right heel pressure injury, POA - Continue local wound care  -unfortunately patient is not interested in following the advice of keeping heel off  bed/NWB   Right elbow swelling- Cellulitis vs thrombophlebitis  -no fracture, resolved    Scheduled Meds:  amoxicillin-clavulanate  1 tablet Oral Q12H   docusate  100 mg Oral BID   feeding supplement (NEPRO CARB STEADY)  237 mL Oral Q24H   fluticasone furoate-vilanterol  1 puff Inhalation Daily   folic acid  1 mg Oral Daily   heparin  5,000 Units Subcutaneous Q8H   magnesium oxide  400 mg Oral Daily   multivitamin with minerals  1 tablet Oral Daily   pantoprazole  40 mg Oral Daily   polyethylene glycol  17 g Oral Daily   thiamine  100 mg Oral Daily   Continuous Infusions: PRN Meds:.acetaminophen, albuterol, HYDROcodone-acetaminophen, ibuprofen, mouth rinse, polyethylene glycol  Current Outpatient Medications  Medication Instructions   albuterol (VENTOLIN HFA) 108 (90 Base) MCG/ACT inhaler 2 puffs, Inhalation, Every 6 hours PRN   amLODipine (NORVASC) 10 mg, Oral, Daily   fluticasone furoate-vilanterol (BREO ELLIPTA) 100-25 MCG/ACT AEPB 1 puff, Inhalation, Daily   folic acid (FOLVITE) 1 mg, Oral, Daily   guaiFENesin-dextromethorphan (ROBITUSSIN DM) 100-10 MG/5ML syrup 10 mLs, Oral, Every 4 hours PRN   lisinopril (ZESTRIL) 10 mg, Oral, Daily   Multiple Vitamin (MULTIVITAMIN WITH MINERALS) TABS tablet 1 tablet, Oral, Daily   pantoprazole (PROTONIX) 40 mg, Oral, Daily   thiamine (VITAMIN B1) 100 mg, Oral, Daily    Diet Orders (From admission, onward)     Start     Ordered   10/23/22 1420  DIET DYS 3 Room service appropriate? No; Fluid consistency: Thin  Diet effective now       Question Answer Comment  Room service appropriate? No   Fluid  consistency: Thin      10/23/22 1419            DVT prophylaxis: heparin injection 5,000 Units Start: 10/15/22 0930   Lab Results  Component Value Date   PLT 472 (H) 11/01/2022      Code Status: Full Code  Family Communication: no family at bedside   Status is: Inpatient  Remains inpatient appropriate because: awaiting  placement.  Level of care: Med-Surg  Consultants:  PCCM  Objective: Vitals:   11/06/22 1500 11/07/22 0347 11/07/22 0738 11/07/22 0855  BP: 134/76 131/70  137/75  Pulse: 98 86  97  Resp: '18 17  16  '$ Temp: 98.3 F (36.8 C)   98.3 F (36.8 C)  TempSrc:    Oral  SpO2: 99% 100%  99%  Weight:   61.8 kg   Height:        Intake/Output Summary (Last 24 hours) at 11/07/2022 1300 Last data filed at 11/07/2022 0855 Gross per 24 hour  Intake 720 ml  Output 4450 ml  Net -3730 ml   Wt Readings from Last 3 Encounters:  11/07/22 61.8 kg  09/23/22 58.8 kg  09/17/22 56.8 kg    Examination:   General: Appearance:    Well developed, well nourished male in no acute distress          Data Reviewed: I have independently reviewed following labs and imaging studies   CBC Recent Labs  Lab 11/01/22 0458  WBC 7.1  HGB 9.7*  HCT 29.1*  PLT 472*  MCV 96.0  MCH 32.0  MCHC 33.3  RDW 16.7*    Recent Labs  Lab 11/01/22 0458  NA 133*  K 3.9  CL 93*  CO2 28  GLUCOSE 117*  BUN 7*  CREATININE 0.53*  CALCIUM 9.3  AST 16  ALT 13  ALKPHOS 55  BILITOT 0.4  ALBUMIN 3.4*  MG 1.6*    ------------------------------------------------------------------------------------------------------------------ No results for input(s): "CHOL", "HDL", "LDLCALC", "TRIG", "CHOLHDL", "LDLDIRECT" in the last 72 hours.  Lab Results  Component Value Date   HGBA1C 5.6 09/02/2022   ------------------------------------------------------------------------------------------------------------------ No results for input(s): "TSH", "T4TOTAL", "T3FREE", "THYROIDAB" in the last 72 hours.  Invalid input(s): "FREET3"  Cardiac Enzymes No results for input(s): "CKMB", "TROPONINI", "MYOGLOBIN" in the last 168 hours.  Invalid input(s): "CK" ------------------------------------------------------------------------------------------------------------------    Component Value Date/Time   BNP 71.0 10/15/2022  0636    CBG: No results for input(s): "GLUCAP" in the last 168 hours.   No results found for this or any previous visit (from the past 240 hour(s)).   Radiology Studies: No results found.   Eulogio Bear DO [facility]  Between 7 am - 7 pm I am available, please contact me via Amion (for emergencies) or Securechat (non urgent messages)  Between 7 pm - 7 am I am not available, please contact night coverage MD/APP via Amion

## 2022-11-08 DIAGNOSIS — J9601 Acute respiratory failure with hypoxia: Secondary | ICD-10-CM | POA: Diagnosis not present

## 2022-11-08 DIAGNOSIS — Z720 Tobacco use: Secondary | ICD-10-CM | POA: Diagnosis not present

## 2022-11-08 DIAGNOSIS — J1569 Pneumonia due to other gram-negative bacteria: Secondary | ICD-10-CM | POA: Diagnosis not present

## 2022-11-08 DIAGNOSIS — E871 Hypo-osmolality and hyponatremia: Secondary | ICD-10-CM | POA: Diagnosis not present

## 2022-11-08 LAB — BASIC METABOLIC PANEL
Anion gap: 9 (ref 5–15)
BUN: 10 mg/dL (ref 8–23)
CO2: 26 mmol/L (ref 22–32)
Calcium: 9.1 mg/dL (ref 8.9–10.3)
Chloride: 94 mmol/L — ABNORMAL LOW (ref 98–111)
Creatinine, Ser: 0.58 mg/dL — ABNORMAL LOW (ref 0.61–1.24)
GFR, Estimated: >60 mL/min (ref >60)
Glucose, Bld: 119 mg/dL — ABNORMAL HIGH (ref 70–99)
Potassium: 4.2 mmol/L (ref 3.5–5.1)
Sodium: 129 mmol/L — ABNORMAL LOW (ref 135–145)

## 2022-11-08 LAB — CBC
HCT: 31.8 % — ABNORMAL LOW (ref 39.0–52.0)
Hemoglobin: 10.5 g/dL — ABNORMAL LOW (ref 13.0–17.0)
MCH: 31.4 pg (ref 26.0–34.0)
MCHC: 33 g/dL (ref 30.0–36.0)
MCV: 95.2 fL (ref 80.0–100.0)
Platelets: 405 10*3/uL — ABNORMAL HIGH (ref 150–400)
RBC: 3.34 MIL/uL — ABNORMAL LOW (ref 4.22–5.81)
RDW: 15.9 % — ABNORMAL HIGH (ref 11.5–15.5)
WBC: 7.5 10*3/uL (ref 4.0–10.5)
nRBC: 0 % (ref 0.0–0.2)

## 2022-11-08 MED ORDER — AMOXICILLIN-POT CLAVULANATE 875-125 MG PO TABS
1.0000 | ORAL_TABLET | Freq: Two times a day (BID) | ORAL | Status: DC
  Administered 2022-11-08 – 2022-11-13 (×11): 1 via ORAL
  Filled 2022-11-08 (×11): qty 1

## 2022-11-08 MED ORDER — BENZOCAINE 10 % MT GEL
Freq: Four times a day (QID) | OROMUCOSAL | Status: DC | PRN
  Filled 2022-11-08: qty 9

## 2022-11-08 NOTE — Progress Notes (Signed)
Dressing changed to the right heel ulcer.  Ulcer noted with brown, tan and sangious drainage along with a foul odor.  Wound cleansed with normal saline and betadine, wet to dry dressing with kerlix and ace wrap applied.

## 2022-11-08 NOTE — Progress Notes (Signed)
PROGRESS NOTE  [name] PX:[id number] DOB: [birth year] DOA: 10/15/2022 PCP: Demetrios Isaacs, FNP   LOS: 24 days   Brief Narrative / Interim history: 67 year old male with with prior polysubstance abuse, EtOH, cocaine, noncompliance, right heel unstageable pressure injury, PVD who comes into the hospital with altered mental status.  He was recently admitted and discharged home as he refused SNF, and came back to the hospital confused, hypoxic, hypercarbic and noted to be aspirating.  He was initially required to the ICU requiring intubation.  Eventually extubated on 2/3 and transferred to [facility] on 2/5.  Currently stable, awaiting SNF  Subjective / 24h Interval events: Jaw slightly better  Assesement and Plan: Principal Problem:   Respiratory failure ([place]) Active Problems:   Alcohol abuse  Right jaw pain -CT scan with out abscess -PRN ibuprofen -PO abx x 5 days  Acute hypoxic and hypercarbic respiratory failure -initially requiring intubation, eventually extubated and now he is stable on room air.  Proteus mirabilis pneumonia -finished Augmentin, status post 8 days  Acute on Chronic Hyponatremia in the setting of beer potomania - On admission serum sodium 112, currently in the upper 120s to low 130s which  is his baseline   Dysphagia  -advanced to thin liquids -aspiration precautions   Hypokalemia, hypomagnesemia -continue to monitor and replete as indicated  Prediabetes, blood sugars are well-controlled - A1c 5.6 on 09/02/2022.   Chronic EtOH abuse with concern for withdrawal, cocaine abuse - UDS positive for cocaine on 09/20/2022. Continue folic acid supplement, thiamine supplement, multivitamins, out of window for alcohol withdrawal.   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 - Continue home regimen   Unstageable right heel pressure injury, POA - Continue local wound care  -unfortunately patient is not interested in following the advice of keeping heel off  bed/NWB   Right elbow swelling- Cellulitis vs thrombophlebitis  -no fracture, resolved    Scheduled Meds:  amoxicillin-clavulanate  1 tablet Oral Q12H   docusate  100 mg Oral BID   feeding supplement (NEPRO CARB STEADY)  237 mL Oral Q24H   fluticasone furoate-vilanterol  1 puff Inhalation Daily   folic acid  1 mg Oral Daily   heparin  5,000 Units Subcutaneous Q8H   magnesium oxide  400 mg Oral Daily   multivitamin with minerals  1 tablet Oral Daily   pantoprazole  40 mg Oral Daily   polyethylene glycol  17 g Oral Daily   thiamine  100 mg Oral Daily   Continuous Infusions: PRN Meds:.acetaminophen, albuterol, benzocaine, HYDROcodone-acetaminophen, ibuprofen, mouth rinse, polyethylene glycol  Current Outpatient Medications  Medication Instructions   albuterol (VENTOLIN HFA) 108 (90 Base) MCG/ACT inhaler 2 puffs, Inhalation, Every 6 hours PRN   amLODipine (NORVASC) 10 mg, Oral, Daily   fluticasone furoate-vilanterol (BREO ELLIPTA) 100-25 MCG/ACT AEPB 1 puff, Inhalation, Daily   folic acid (FOLVITE) 1 mg, Oral, Daily   guaiFENesin-dextromethorphan (ROBITUSSIN DM) 100-10 MG/5ML syrup 10 mLs, Oral, Every 4 hours PRN   lisinopril (ZESTRIL) 10 mg, Oral, Daily   Multiple Vitamin (MULTIVITAMIN WITH MINERALS) TABS tablet 1 tablet, Oral, Daily   pantoprazole (PROTONIX) 40 mg, Oral, Daily   thiamine (VITAMIN B1) 100 mg, Oral, Daily    Diet Orders (From admission, onward)     Start     Ordered   10/23/22 1420  DIET DYS 3 Room service appropriate? No; Fluid consistency: Thin  Diet effective now       Question Answer Comment  Room service appropriate? No   Fluid  consistency: Thin      10/23/22 1419            DVT prophylaxis: heparin injection 5,000 Units Start: 10/15/22 0930   Lab Results  Component Value Date   PLT 405 (H) 11/08/2022      Code Status: Full Code  Family Communication: sister at bedside 2/26  Status is: Inpatient  Remains inpatient appropriate because:  awaiting placement.  Level of care: Med-Surg  Consultants:  PCCM  Objective: Vitals:   11/08/22 0312 11/08/22 0815 11/08/22 0834 11/08/22 0856  BP: 134/71   (!) 153/84  Pulse: 96   (!) 106  Resp: 20   18  Temp:    98.2 F (36.8 C)  TempSrc:    Oral  SpO2: 98%  98% 99%  Weight:  62.4 kg    Height:        Intake/Output Summary (Last 24 hours) at 11/08/2022 1039 Last data filed at 11/08/2022 0900 Gross per 24 hour  Intake 480 ml  Output 1590 ml  Net -1110 ml   Wt Readings from Last 3 Encounters:  11/08/22 62.4 kg  09/23/22 58.8 kg  09/17/22 56.8 kg    Examination:   General: Appearance:    Well developed, well nourished male in no acute distress          Data Reviewed: I have independently reviewed following labs and imaging studies   CBC Recent Labs  Lab 11/08/22 0257  WBC 7.5  HGB 10.5*  HCT 31.8*  PLT 405*  MCV 95.2  MCH 31.4  MCHC 33.0  RDW 15.9*    Recent Labs  Lab 11/08/22 0257  NA 129*  K 4.2  CL 94*  CO2 26  GLUCOSE 119*  BUN 10  CREATININE 0.58*  CALCIUM 9.1    ------------------------------------------------------------------------------------------------------------------ No results for input(s): "CHOL", "HDL", "LDLCALC", "TRIG", "CHOLHDL", "LDLDIRECT" in the last 72 hours.  Lab Results  Component Value Date   HGBA1C 5.6 09/02/2022   ------------------------------------------------------------------------------------------------------------------ No results for input(s): "TSH", "T4TOTAL", "T3FREE", "THYROIDAB" in the last 72 hours.  Invalid input(s): "FREET3"  Cardiac Enzymes No results for input(s): "CKMB", "TROPONINI", "MYOGLOBIN" in the last 168 hours.  Invalid input(s): "CK" ------------------------------------------------------------------------------------------------------------------    Component Value Date/Time   BNP 71.0 10/15/2022 0636    CBG: No results for input(s): "GLUCAP" in the last 168 hours.   No  results found for this or any previous visit (from the past 240 hour(s)).   Radiology Studies: CT MAXILLOFACIAL W CONTRAST  Result Date: 11/07/2022 CLINICAL DATA:  Right lower jaw pain EXAM: CT MAXILLOFACIAL WITH CONTRAST TECHNIQUE: Multidetector CT imaging of the maxillofacial structures was performed with intravenous contrast. Multiplanar CT image reconstructions were also generated. RADIATION DOSE REDUCTION: This exam was performed according to the departmental dose-optimization program which includes automated exposure control, adjustment of the mA and/or kV according to patient size and/or use of iterative reconstruction technique. CONTRAST:  17m OMNIPAQUE IOHEXOL 350 MG/ML SOLN COMPARISON:  Head CT 09/19/2022 FINDINGS: Osseous: No fracture or mandibular dislocation. Multiple maxillary periapical lucencies. 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 Orbits: Negative. No traumatic or inflammatory finding. Sinuses: Clear. Soft tissues: Negative. Limited intracranial: No significant or unexpected finding. IMPRESSION: 1. No specific widening to account for right jaw pain. 2. Multiple maxillary periapical lucencies. 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 Electronically Signed   By: KUlyses Jarred  M.D.   On: 11/07/2022 23:19     Eulogio Bear DO [facility]  Between 7 am - 7 pm I am available, please contact me via Amion (for emergencies) or Securechat (non urgent messages)  Between 7 pm - 7 am I am not available, please contact night coverage MD/APP via Amion

## 2022-11-09 ENCOUNTER — Inpatient Hospital Stay (HOSPITAL_COMMUNITY): Payer: Medicare Other

## 2022-11-09 DIAGNOSIS — J9601 Acute respiratory failure with hypoxia: Secondary | ICD-10-CM | POA: Diagnosis not present

## 2022-11-09 DIAGNOSIS — F1010 Alcohol abuse, uncomplicated: Secondary | ICD-10-CM | POA: Diagnosis not present

## 2022-11-09 DIAGNOSIS — I739 Peripheral vascular disease, unspecified: Secondary | ICD-10-CM | POA: Diagnosis not present

## 2022-11-09 DIAGNOSIS — J1569 Pneumonia due to other gram-negative bacteria: Secondary | ICD-10-CM | POA: Diagnosis not present

## 2022-11-09 DIAGNOSIS — E871 Hypo-osmolality and hyponatremia: Secondary | ICD-10-CM | POA: Diagnosis not present

## 2022-11-09 LAB — VAS US ABI WITH/WO TBI: Right ABI: 1.12

## 2022-11-09 MED ORDER — DAKINS (1/4 STRENGTH) 0.125 % EX SOLN
Freq: Two times a day (BID) | CUTANEOUS | Status: AC
  Administered 2022-11-11 – 2022-11-15 (×2): 1
  Filled 2022-11-09: qty 473

## 2022-11-09 MED ORDER — JUVEN PO PACK
1.0000 | PACK | Freq: Two times a day (BID) | ORAL | Status: DC
  Administered 2022-11-09 – 2022-12-03 (×43): 1 via ORAL
  Filled 2022-11-09 (×45): qty 1

## 2022-11-09 NOTE — Progress Notes (Signed)
ABI has been completed.     Results can be found under chart review under CV PROC. 11/09/2022 5:11 PM Janoah Menna RVT, RDMS

## 2022-11-09 NOTE — Progress Notes (Signed)
Dressing to the right heel changed and daxkins used per orders.  Pt also had a xray of the foot. Ortho and the wound team was consulted today regarding the wound on the pt right heel

## 2022-11-09 NOTE — Progress Notes (Addendum)
PROGRESS NOTE  [name] PX:[id number] DOB: [birth year] DOA: 10/15/2022 PCP: Demetrios Isaacs, FNP   LOS: 25 days   Brief Narrative / Interim history: 67 year old male with with prior polysubstance abuse, EtOH, cocaine, noncompliance, right heel unstageable pressure injury, PVD who comes into the hospital with altered mental status.  He was recently admitted and discharged home as he refused SNF, and came back to the hospital confused, hypoxic, hypercarbic and noted to be aspirating.  He was initially required to the ICU requiring intubation.  Eventually extubated on 2/3 and transferred to [facility] on 2/5.  Currently stable, awaiting SNF  Subjective / 24h Interval events: Foot hurting more and per RN dressing change brought about a foul odor  Assesement and Plan: Principal Problem:   Respiratory failure ([place]) Active Problems:   Alcohol abuse  Right jaw pain -CT scan with out abscess -PRN ibuprofen -PO abx x 5 days  Unstageable right heel pressure injury, POA -  -appears worsened -will get MRI -ortho consult for possible debridement -has been unwilling in the past to follow recommendations but currently willing   Acute hypoxic and hypercarbic respiratory failure -initially requiring intubation, eventually extubated and now he is stable on room air.  Proteus mirabilis pneumonia -finished Augmentin, status post 8 days  Acute on Chronic Hyponatremia in the setting of beer potomania - On admission serum sodium 112, currently in the upper 120s to low 130s which  is his baseline   Dysphagia  -advanced to thin liquids -aspiration precautions   Hypokalemia, hypomagnesemia -continue to monitor and replete as indicated  Prediabetes, blood sugars are well-controlled - A1c 5.6 on 09/02/2022.   Chronic EtOH abuse with concern for withdrawal, cocaine abuse - UDS positive for cocaine on 09/20/2022. Continue folic acid supplement, thiamine supplement, multivitamins, out of window for alcohol  withdrawal.   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 - Continue home regimen   Right elbow swelling- Cellulitis vs thrombophlebitis  -no fracture, resolved    Scheduled Meds:  amoxicillin-clavulanate  1 tablet Oral Q12H   docusate  100 mg Oral BID   feeding supplement (NEPRO CARB STEADY)  237 mL Oral Q24H   fluticasone furoate-vilanterol  1 puff Inhalation Daily   folic acid  1 mg Oral Daily   heparin  5,000 Units Subcutaneous Q8H   magnesium oxide  400 mg Oral Daily   multivitamin with minerals  1 tablet Oral Daily   nutrition supplement (JUVEN)  1 packet Oral BID BM   pantoprazole  40 mg Oral Daily   polyethylene glycol  17 g Oral Daily   sodium hypochlorite   Irrigation BID   thiamine  100 mg Oral Daily   Continuous Infusions: PRN Meds:.acetaminophen, albuterol, benzocaine, HYDROcodone-acetaminophen, ibuprofen, mouth rinse, polyethylene glycol  Current Outpatient Medications  Medication Instructions   albuterol (VENTOLIN HFA) 108 (90 Base) MCG/ACT inhaler 2 puffs, Inhalation, Every 6 hours PRN   amLODipine (NORVASC) 10 mg, Oral, Daily   fluticasone furoate-vilanterol (BREO ELLIPTA) 100-25 MCG/ACT AEPB 1 puff, Inhalation, Daily   folic acid (FOLVITE) 1 mg, Oral, Daily   guaiFENesin-dextromethorphan (ROBITUSSIN DM) 100-10 MG/5ML syrup 10 mLs, Oral, Every 4 hours PRN   lisinopril (ZESTRIL) 10 mg, Oral, Daily   Multiple Vitamin (MULTIVITAMIN WITH MINERALS) TABS tablet 1 tablet, Oral, Daily   pantoprazole (PROTONIX) 40 mg, Oral, Daily   thiamine (VITAMIN B1) 100 mg, Oral, Daily    Diet Orders (From admission, onward)     Start     Ordered   10/23/22  1420  DIET DYS 3 Room service appropriate? No; Fluid consistency: Thin  Diet effective now       Question Answer Comment  Room service appropriate? No   Fluid consistency: Thin      10/23/22 1419            DVT prophylaxis: heparin injection 5,000 Units Start: 10/15/22 0930   Lab Results  Component Value  Date   PLT 405 (H) 11/08/2022      Code Status: Full Code  Family Communication: sister at bedside 2/26  Status is: Inpatient  Remains inpatient appropriate because: awaiting placement and possible need to go back to the OR  Level of care: Med-Surg  Consultants:  PCCM  Objective: Vitals:   11/08/22 1956 11/09/22 0508 11/09/22 0755 11/09/22 0820  BP: (!) 152/80 138/83 134/86   Pulse: 100 99 99   Resp: '14 16 17   '$ Temp: 98.2 F (36.8 C) 97.7 F (36.5 C) 98.4 F (36.9 C)   TempSrc: Oral Oral Oral   SpO2: 100% 99% 100%   Weight:    65 kg  Height:        Intake/Output Summary (Last 24 hours) at 11/09/2022 1036 Last data filed at 11/09/2022 0510 Gross per 24 hour  Intake 120 ml  Output 2625 ml  Net -2505 ml   Wt Readings from Last 3 Encounters:  11/09/22 65 kg  09/23/22 58.8 kg  09/17/22 56.8 kg    Examination:   General: Appearance:    Well developed, well nourished male in no acute distress          Data Reviewed: I have independently reviewed following labs and imaging studies   CBC Recent Labs  Lab 11/08/22 0257  WBC 7.5  HGB 10.5*  HCT 31.8*  PLT 405*  MCV 95.2  MCH 31.4  MCHC 33.0  RDW 15.9*    Recent Labs  Lab 11/08/22 0257  NA 129*  K 4.2  CL 94*  CO2 26  GLUCOSE 119*  BUN 10  CREATININE 0.58*  CALCIUM 9.1    ------------------------------------------------------------------------------------------------------------------ No results for input(s): "CHOL", "HDL", "LDLCALC", "TRIG", "CHOLHDL", "LDLDIRECT" in the last 72 hours.  Lab Results  Component Value Date   HGBA1C 5.6 09/02/2022   ------------------------------------------------------------------------------------------------------------------ No results for input(s): "TSH", "T4TOTAL", "T3FREE", "THYROIDAB" in the last 72 hours.  Invalid input(s): "FREET3"  Cardiac Enzymes No results for input(s): "CKMB", "TROPONINI", "MYOGLOBIN" in the last 168 hours.  Invalid  input(s): "CK" ------------------------------------------------------------------------------------------------------------------    Component Value Date/Time   BNP 71.0 10/15/2022 0636    CBG: No results for input(s): "GLUCAP" in the last 168 hours.   No results found for this or any previous visit (from the past 240 hour(s)).   Radiology Studies: No results found.   Eulogio Bear DO [facility]  Between 7 am - 7 pm I am available, please contact me via Amion (for emergencies) or Securechat (non urgent messages)  Between 7 pm - 7 am I am not available, please contact night coverage MD/APP via Amion

## 2022-11-09 NOTE — Consult Note (Signed)
[place] Nurse Consult Note: Reason for Consult: heel wound-re-evaluation; wound has worsened.  Patient with PAD, history of polysubstance abuse, BKA 2015/LLE. We have seen this patient this admission last on 2/5. Attempted to keep right heel wound stable, however patient refusing to offload at times.  Wound type: Unstageable Pressure Injury in the presence of DM/PAD Pressure Injury POA: Yes Measurement: 6cm x 6cm x 1cm  Wound bed:90% soft black eschar; lifting from wound bed; pale pink perimeter of wound bed visible  Drainage (amount, consistency, odor) reported moderate and purulent  Periwound: intact  Dressing procedure/placement/frequency: DC betadine at this point 1/4% Sodium hypochlorite gauze BID Consult VVS or orthopedics for debridement vs amputation. New xray ordered per hospitalist; may need MRI.  Offload with Prevalon boot at all times.    Discussed case with Dr. Eliseo Squires as well.    Re consult if needed, will not follow at this time. Thanks  Shonteria Abeln [employer], RN,CWOCN, CNS, [place] [phone])

## 2022-11-09 NOTE — Consult Note (Signed)
Reason for Consult:Right heel ulcer Referring Physician: Eulogio Bear Time called: [id number] Time at bedside: Ronald [name] is an 67 y.o. male.  HPI: Ronald [name] has been admitted for nearly a month. He's had a right heel ulceration that has been present for months or possibly over a year. He often refuses to let it be treated and will not wear an offloading device. He has consented now to treatment and does admit to pain with it. He does not ambulate.  Past Medical History:  Diagnosis Date   Critical lower limb ischemia (HCC)    ETOH abuse    Gout    Hypertension     Past Surgical History:  Procedure Laterality Date   ABDOMINAL AORTAGRAM N/A 12/26/2013   Procedure: ABDOMINAL Maxcine Ham;  Surgeon: Serafina Mitchell, MD;  Location: [facility] CATH LAB;  Service: Cardiovascular;  Laterality: N/A;   AMPUTATION Left 12/28/2013   Procedure: LEFT BELOW KNEE AMPUTATION;  Surgeon: Newt Minion, MD;  Location: [place] [facility];  Service: Orthopedics;  Laterality: Left;   BELOW KNEE LEG AMPUTATION     Left   LEG SURGERY      Family History  Problem Relation Age of Onset   Diabetes Mellitus II Mother    CAD Neg Hx    Stroke Neg Hx     Social History:  reports that he has been smoking cigarettes. He has a 20.00 pack-year smoking history. He has never used smokeless tobacco. He reports current alcohol use of about 120.0 standard drinks of alcohol per week. He reports current drug use. Drugs: Cocaine and Marijuana.  Allergies: No Known Allergies  Medications: I have reviewed the patient's current medications.  Results for orders placed or performed during the hospital encounter of 10/15/22 (from the past 48 hour(s))  CBC     Status: Abnormal   Collection Time: 11/08/22  2:57 AM  Result Value Ref Range   WBC 7.5 4.0 - 10.5 K/uL   RBC 3.34 (L) 4.22 - 5.81 MIL/uL   Hemoglobin 10.5 (L) 13.0 - 17.0 g/dL   HCT 31.8 (L) 39.0 - 52.0 %   MCV 95.2 80.0 - 100.0 fL   MCH 31.4 26.0 - 34.0 pg   MCHC 33.0 30.0 -  36.0 g/dL   RDW 15.9 (H) 11.5 - 15.5 %   Platelets 405 (H) 150 - 400 K/uL   nRBC 0.0 0.0 - 0.2 %    Comment: Performed at [facility], [address] [place], [place] [id number]  Basic metabolic panel     Status: Abnormal   Collection Time: 11/08/22  2:57 AM  Result Value Ref Range   Sodium 129 (L) 135 - 145 mmol/L   Potassium 4.2 3.5 - 5.1 mmol/L   Chloride 94 (L) 98 - 111 mmol/L   CO2 26 22 - 32 mmol/L   Glucose, Bld 119 (H) 70 - 99 mg/dL    Comment: Glucose reference range applies only to samples taken after fasting for at least 8 hours.   BUN 10 8 - 23 mg/dL   Creatinine, Ser 0.58 (L) 0.61 - 1.24 mg/dL   Calcium 9.1 8.9 - 10.3 mg/dL   GFR, Estimated >60 >60 mL/min    Comment: (NOTE) Calculated using the CKD-EPI Creatinine Equation (2021)    Anion gap 9 5 - 15    Comment: Performed at [facility] [address] [place], [place] [address]    CT MAXILLOFACIAL W CONTRAST  Result Date: 11/07/2022 CLINICAL DATA:  Right lower jaw pain EXAM: CT MAXILLOFACIAL WITH CONTRAST TECHNIQUE: Multidetector CT imaging of the maxillofacial structures was performed with intravenous contrast. Multiplanar CT image reconstructions were also generated. RADIATION DOSE REDUCTION: This exam was performed according to the departmental dose-optimization program which includes automated exposure control, adjustment of the mA and/or kV according to patient size and/or use of iterative reconstruction technique. CONTRAST:  57m OMNIPAQUE IOHEXOL 350 MG/ML SOLN COMPARISON:  Head CT 09/19/2022 FINDINGS: Osseous: No fracture or mandibular dislocation. Multiple maxillary periapical lucencies. 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 Orbits: Negative. No traumatic or inflammatory finding. Sinuses: Clear. Soft tissues: Negative. Limited intracranial: No significant or unexpected finding. IMPRESSION: 1. No specific widening to account for right jaw  pain. 2. Multiple maxillary periapical lucencies. 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 Electronically Signed   By: KUlyses JarredM.D.   On: 11/07/2022 23:19    Review of Systems  Constitutional:  Negative for chills, diaphoresis and fever.  HENT:  Negative for ear discharge, ear pain, hearing loss and tinnitus.   Eyes:  Negative for photophobia and pain.  Respiratory:  Negative for cough and shortness of breath.   Cardiovascular:  Negative for chest pain.  Gastrointestinal:  Negative for abdominal pain, nausea and vomiting.  Genitourinary:  Negative for dysuria, flank pain, frequency and urgency.  Musculoskeletal:  Positive for arthralgias (Right heel). Negative for back pain, myalgias and neck pain.  Neurological:  Negative for dizziness and headaches.  Hematological:  Does not bruise/bleed easily.  Psychiatric/Behavioral:  The patient is not nervous/anxious.    Blood pressure 134/86, pulse 99, temperature 98.4 F (36.9 C), temperature source Oral, resp. rate 17, height '5\' 7"'$  (1.702 m), weight 65 kg, SpO2 100 %. Physical Exam Constitutional:      General: He is not in acute distress.    Appearance: He is well-developed. He is not diaphoretic.  HENT:     Head: Normocephalic and atraumatic.  Eyes:     General: No scleral icterus.       Right eye: No discharge.        Left eye: No discharge.     Conjunctiva/sclera: Conjunctivae normal.  Cardiovascular:     Rate and Rhythm: Normal rate and regular rhythm.  Pulmonary:     Effort: Pulmonary effort is normal. No respiratory distress.  Musculoskeletal:     Cervical back: Normal range of motion.  Feet:     Comments: Right foot -- Heel ulceration with necrotic pad, mild foul odor, bloody discharge. 1+ DP, 0 PT. SPN/DPN/TN intact. Ankle fused. EHL 5/5. Skin:    General: Skin is warm and dry.  Neurological:     Mental Status: He is alert.  Psychiatric:        Mood  and Affect: Mood normal.        Behavior: Behavior normal.     Assessment/Plan: Right heel ulceration -- Will get MRI to look at underlying disease and will check ABI's. Dr. DSharol Givento evaluate later today or in AM.     MLisette Abu PA-C Orthopedic Surgery [phone]/27/2024, 10:02 AM

## 2022-11-10 ENCOUNTER — Inpatient Hospital Stay (HOSPITAL_COMMUNITY): Payer: Medicare Other

## 2022-11-10 DIAGNOSIS — J441 Chronic obstructive pulmonary disease with (acute) exacerbation: Secondary | ICD-10-CM | POA: Diagnosis not present

## 2022-11-10 DIAGNOSIS — M86171 Other acute osteomyelitis, right ankle and foot: Secondary | ICD-10-CM | POA: Diagnosis not present

## 2022-11-10 DIAGNOSIS — J9602 Acute respiratory failure with hypercapnia: Secondary | ICD-10-CM | POA: Diagnosis not present

## 2022-11-10 DIAGNOSIS — I739 Peripheral vascular disease, unspecified: Secondary | ICD-10-CM | POA: Diagnosis not present

## 2022-11-10 DIAGNOSIS — J9601 Acute respiratory failure with hypoxia: Secondary | ICD-10-CM | POA: Diagnosis not present

## 2022-11-10 DIAGNOSIS — J1569 Pneumonia due to other gram-negative bacteria: Secondary | ICD-10-CM | POA: Diagnosis not present

## 2022-11-10 MED ORDER — GADOBUTROL 1 MMOL/ML IV SOLN
7.0000 mL | Freq: Once | INTRAVENOUS | Status: AC | PRN
  Administered 2022-11-10: 7 mL via INTRAVENOUS

## 2022-11-10 NOTE — Progress Notes (Signed)
Occupational Therapy Treatment Patient Details Name: Ronald [name] MRN: [id number] DOB: 10-18-1955 Today's Date: 11/10/2022   History of present illness This is a 67 year old gentleman admitted 2/2 after being found down at home after a recent d/c from hospital where he refused SNF. Pt was hyponatremic hypoxemic hypercarbic was placed on BiPAP in the emergency room. Ultimately failed BiPAP requiring intubation mechanical ventilation 2/2--2/3. 2/6: PTA discussed with DO pt R heel ulcer (POA) and pt NWB as of 2/7. PMH: polysubstance abuse, cocaine, alcohol use, R heel pressure injury.   OT comments  Pt more agreeable to session today. Performing anterior/posterior transfer from bed<>chair and back during session with min A to chair and mod A to bed due to difficulty coordinating RLE back into bed and poor adherence to weightbearing precautions. Pt performing functional mobility into hall and required min cues for problem solving to locate room after mobility to rehab gym. Pt becoming frustrated with fatigue on return to room, requiring max redirection and mod A for safety with transfer back to bed. Continue to recommend SNF for continued OT services.    Recommendations for follow up therapy are one component of a multi-disciplinary discharge planning process, led by the attending physician.  Recommendations may be updated based on patient status, additional functional criteria and insurance authorization.    Follow Up Recommendations  Skilled nursing-short term rehab (<3 hours/day)     Assistance Recommended at Discharge Frequent or constant Supervision/Assistance  Patient can return home with the following  A little help with walking and/or transfers;A little help with bathing/dressing/bathroom;Assistance with cooking/housework;Direct supervision/assist for financial management;Direct supervision/assist for medications management;Help with stairs or ramp for entrance   Equipment  Recommendations  None recommended by OT    Recommendations for Other Services      Precautions / Restrictions Precautions Precautions: Fall Restrictions Weight Bearing Restrictions: Yes RLE Weight Bearing: Non weight bearing       Mobility Bed Mobility Overal bed mobility: Needs Assistance Bed Mobility: Supine to Sit, Sit to Supine     Supine to sit: Supervision Sit to supine: Supervision   General bed mobility comments: supervision for safety    Transfers Overall transfer level: Needs assistance Equipment used: None Transfers: Bed to chair/wheelchair/BSC         Anterior-Posterior transfers: Min assist, Mod assist   General transfer comment: Up to mod A for transfer back to bed due to poor adherence to precautions and impulsiveess. Assist provided for pt safety     Balance Overall balance assessment: Needs assistance Sitting-balance support: Bilateral upper extremity supported Sitting balance-Leahy Scale: Fair                                     ADL either performed or assessed with clinical judgement   ADL Overall ADL's : Needs assistance/impaired                         Toilet Transfer: Moderate assistance;Anterior/posterior Armed forces technical officer Details (indicate cue type and reason): to wheelchair         Functional mobility during ADLs: Wheelchair;Supervision/safety General ADL Comments: some decreased attention (selectively) and decreased effort during functional mobility in wheelchair in hall and nearly bumping into onjects 3x    Extremity/Trunk Assessment Upper Extremity Assessment Upper Extremity Assessment: Generalized weakness   Lower Extremity Assessment Lower Extremity Assessment: Defer to PT evaluation  Vision       Perception     Praxis      Cognition Arousal/Alertness: Awake/alert Behavior During Therapy: Agitated Overall Cognitive Status: No family/caregiver present to determine baseline  cognitive functioning Area of Impairment: Problem solving, Following commands, Attention, Awareness                   Current Attention Level: Selective Memory: Decreased recall of precautions Following Commands: Follows one step commands with increased time, Follows one step commands inconsistently Safety/Judgement: Decreased awareness of deficits Awareness: Intellectual Problem Solving: Slow processing, Requires verbal cues, Requires tactile cues, Decreased initiation          Exercises      Shoulder Instructions       General Comments      Pertinent Vitals/ Pain       Pain Assessment Pain Assessment: Faces Faces Pain Scale: Hurts little more Pain Location: R foot Pain Descriptors / Indicators: Grimacing, Guarding Pain Intervention(s): Limited activity within patient's tolerance, Monitored during session, Repositioned  Home Living                                          Prior Functioning/Environment              Frequency  Min 2X/week        Progress Toward Goals  OT Goals(current goals can now be found in the care plan section)  Progress towards OT goals: Progressing toward goals  Acute Rehab OT Goals Patient Stated Goal: go home after planned RLE amputation OT Goal Formulation: With patient Time For Goal Achievement: 11/17/22 Potential to Achieve Goals: Good ADL Goals Pt Will Perform Lower Body Dressing: with supervision;sitting/lateral leans Pt Will Transfer to Toilet: with min guard assist;anterior/posterior transfer Pt Will Perform Toileting - Clothing Manipulation and hygiene: with supervision;sitting/lateral leans Pt/caregiver will Perform Home Exercise Program: Increased strength;Right Upper extremity;Left upper extremity;Both right and left upper extremity;With written HEP provided Additional ADL Goal #1: Pt will transfer to wheelchair while maintaining WB precautions with min guard A.  Plan Discharge plan remains  appropriate    Co-evaluation                 AM-PAC OT "6 Clicks" Daily Activity     Outcome Measure   Help from another person eating meals?: None Help from another person taking care of personal grooming?: A Little Help from another person toileting, which includes using toliet, bedpan, or urinal?: A Little Help from another person bathing (including washing, rinsing, drying)?: A Little Help from another person to put on and taking off regular upper body clothing?: A Little Help from another person to put on and taking off regular lower body clothing?: A Lot 6 Click Score: 18    End of Session Equipment Utilized During Treatment: Other (comment) (wheel chair)  OT Visit Diagnosis: Unsteadiness on feet (R26.81);Other abnormalities of gait and mobility (R26.89);Muscle weakness (generalized) (M62.81)   Activity Tolerance Patient tolerated treatment well   Patient Left in bed;with call bell/phone within reach;with bed alarm set   Nurse Communication Mobility status        Time: [id number] OT Time Calculation (min): 25 min  Charges: OT General Charges $OT Visit: 1 Visit OT Treatments $Self Care/Home Management : 8-22 mins $Therapeutic Activity: 8-22 mins  Magnus Ivan, OTD, OTR/L [facility] Acute Rehabilitation Office: [phone]   Magnus Ivan  11/10/2022, 5:39 PM

## 2022-11-10 NOTE — Progress Notes (Signed)
PROGRESS NOTE  [name] [id number] DOB: 1956-07-19 DOA: 10/15/2022 PCP: Demetrios Isaacs, FNP   LOS: 26 days   Brief Narrative / Interim history: 67 year old male with with prior polysubstance abuse, EtOH, cocaine, noncompliance, right heel unstageable pressure injury, PVD who comes into the hospital with altered mental status.  He was recently admitted and discharged home as he refused SNF, and came back to the hospital confused, hypoxic, hypercarbic and noted to be aspirating.  He was initially required to the ICU requiring intubation.  Eventually extubated on 2/3 and transferred to [facility] on 2/5.  Currently stable, awaiting SNF  Subjective / 24h Interval events: No significant foot pain this morning  Assesement and Plan: Principal Problem:   Respiratory failure ([place]) Active Problems:   Alcohol abuse   Acute osteomyelitis of right calcaneus (HCC)   PVD (peripheral vascular disease) (HCC)  Principal problem Right jaw pain -CT scan with out abscess, PRN ibuprofen -PO abx x 5 days, today is day 3  Active problems Unstageable right heel pressure injury, POA -appears worsened -will get MRI, pending this morning -ortho consult for possible debridement -has been unwilling in the past to follow recommendations but currently willing   Acute hypoxic and hypercarbic respiratory failure -initially requiring intubation, eventually extubated and now he is stable on room air.  Proteus mirabilis pneumonia -finished Augmentin, status post 8 days  Acute on Chronic Hyponatremia in the setting of beer potomania - On admission serum sodium 112, currently in the upper 120s to low 130s which  is his baseline   Dysphagia  -advanced to thin liquids -aspiration precautions   Hypokalemia, hypomagnesemia -continue to monitor and replete as indicated  Prediabetes, blood sugars are well-controlled - A1c 5.6 on 09/02/2022.   Chronic EtOH abuse with concern for withdrawal, cocaine abuse - UDS  positive for cocaine on 09/20/2022. Continue folic acid supplement, thiamine supplement, multivitamins, out of window for alcohol withdrawal.   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 - Continue home regimen   Right elbow swelling- Cellulitis vs thrombophlebitis  -no fracture, resolved    Scheduled Meds:  amoxicillin-clavulanate  1 tablet Oral Q12H   docusate  100 mg Oral BID   feeding supplement (NEPRO CARB STEADY)  237 mL Oral Q24H   fluticasone furoate-vilanterol  1 puff Inhalation Daily   folic acid  1 mg Oral Daily   heparin  5,000 Units Subcutaneous Q8H   magnesium oxide  400 mg Oral Daily   multivitamin with minerals  1 tablet Oral Daily   nutrition supplement (JUVEN)  1 packet Oral BID BM   pantoprazole  40 mg Oral Daily   polyethylene glycol  17 g Oral Daily   sodium hypochlorite   Irrigation BID   thiamine  100 mg Oral Daily   Continuous Infusions: PRN Meds:.acetaminophen, albuterol, benzocaine, HYDROcodone-acetaminophen, ibuprofen, mouth rinse, polyethylene glycol  Current Outpatient Medications  Medication Instructions   albuterol (VENTOLIN HFA) 108 (90 Base) MCG/ACT inhaler 2 puffs, Inhalation, Every 6 hours PRN   amLODipine (NORVASC) 10 mg, Oral, Daily   fluticasone furoate-vilanterol (BREO ELLIPTA) 100-25 MCG/ACT AEPB 1 puff, Inhalation, Daily   folic acid (FOLVITE) 1 mg, Oral, Daily   guaiFENesin-dextromethorphan (ROBITUSSIN DM) 100-10 MG/5ML syrup 10 mLs, Oral, Every 4 hours PRN   lisinopril (ZESTRIL) 10 mg, Oral, Daily   Multiple Vitamin (MULTIVITAMIN WITH MINERALS) TABS tablet 1 tablet, Oral, Daily   pantoprazole (PROTONIX) 40 mg, Oral, Daily   thiamine (VITAMIN B1) 100 mg, Oral, Daily    Diet  Orders (From admission, onward)     Start     Ordered   10/23/22 1420  DIET DYS 3 Room service appropriate? No; Fluid consistency: Thin  Diet effective now       Question Answer Comment  Room service appropriate? No   Fluid consistency: Thin      10/23/22  1419            DVT prophylaxis: heparin injection 5,000 Units Start: 10/15/22 0930   Lab Results  Component Value Date   PLT 405 (H) 11/08/2022      Code Status: Full Code  Family Communication: no family at bedside  Status is: Inpatient  Remains inpatient appropriate because: awaiting placement and possible need to go back to the OR  Level of care: Med-Surg  Consultants:  PCCM  Objective: Vitals:   11/09/22 1941 11/10/22 0326 11/10/22 0801 11/10/22 0900  BP: 134/80 138/70  134/87  Pulse: 100 88  (!) 107  Resp: '18 18  18  '$ Temp: 98.5 F (36.9 C)   98.1 F (36.7 C)  TempSrc: Oral   Oral  SpO2: 100% 100%  99%  Weight:   65.5 kg   Height:        Intake/Output Summary (Last 24 hours) at 11/10/2022 1006 Last data filed at 11/10/2022 0644 Gross per 24 hour  Intake 1080 ml  Output 3100 ml  Net -2020 ml    Wt Readings from Last 3 Encounters:  11/10/22 65.5 kg  09/23/22 58.8 kg  09/17/22 56.8 kg    Examination:  Constitutional: NAD Eyes: lids and conjunctivae normal, no scleral icterus ENMT: mmm Neck: normal, supple Respiratory: clear to auscultation bilaterally, no wheezing, no crackles. Normal respiratory effort.  Cardiovascular: Regular rate and rhythm, no murmurs / rubs / gallops. No LE edema. Abdomen: soft, no distention, no tenderness. Bowel sounds positive.  Skin: no rashes Neurologic: no focal deficits, equal strength          Data Reviewed: I have independently reviewed following labs and imaging studies   CBC Recent Labs  Lab 11/08/22 0257  WBC 7.5  HGB 10.5*  HCT 31.8*  PLT 405*  MCV 95.2  MCH 31.4  MCHC 33.0  RDW 15.9*     Recent Labs  Lab 11/08/22 0257  NA 129*  K 4.2  CL 94*  CO2 26  GLUCOSE 119*  BUN 10  CREATININE 0.58*  CALCIUM 9.1     ------------------------------------------------------------------------------------------------------------------ No results for input(s): "CHOL", "HDL", "LDLCALC", "TRIG",  "CHOLHDL", "LDLDIRECT" in the last 72 hours.  Lab Results  Component Value Date   HGBA1C 5.6 09/02/2022   ------------------------------------------------------------------------------------------------------------------ No results for input(s): "TSH", "T4TOTAL", "T3FREE", "THYROIDAB" in the last 72 hours.  Invalid input(s): "FREET3"  Cardiac Enzymes No results for input(s): "CKMB", "TROPONINI", "MYOGLOBIN" in the last 168 hours.  Invalid input(s): "CK" ------------------------------------------------------------------------------------------------------------------    Component Value Date/Time   BNP 71.0 10/15/2022 0636    CBG: No results for input(s): "GLUCAP" in the last 168 hours.   No results found for this or any previous visit (from the past 240 hour(s)).   Radiology Studies: VAS [place] ABI WITH/WO TBI  Result Date: 11/09/2022  LOWER EXTREMITY DOPPLER STUDY Patient Name:  [facility] Rorie  Date of Exam:   11/09/2022 Medical Rec #: [id number]        Accession #:    [id number] Date of Birth: 11/18/1955        Patient Gender: M Patient Age:   67 years Exam Location:  [facility] Procedure:      VAS [place] ABI WITH/WO TBI Referring Phys: MICHAEL JEFFERY --------------------------------------------------------------------------------  High Risk Factors: Hypertension, current smoker. Other Factors: HX of PAD, HX LT BKA.  Comparison Study: Previosu exam 12/25/13 Performing Technologist: Rogelia Rohrer RVT, RDMS  Examination Guidelines: A complete evaluation includes at minimum, Doppler waveform signals and systolic blood pressure reading at the level of bilateral brachial, anterior tibial, and posterior tibial arteries, when vessel segments are accessible. Bilateral testing is considered an integral part of a complete examination. Photoelectric Plethysmograph (PPG) waveforms and toe systolic pressure readings are included as required and additional duplex testing as needed. Limited examinations  for reoccurring indications may be performed as noted.  ABI Findings: +---------+------------------+-----+---------+--------+ Right    Rt Pressure (mmHg)IndexWaveform Comment  +---------+------------------+-----+---------+--------+ Brachial 154                    triphasic         +---------+------------------+-----+---------+--------+ PTA      139               0.90 triphasic         +---------+------------------+-----+---------+--------+ DP       173               1.12 triphasic         +---------+------------------+-----+---------+--------+ Great Toe149               0.97 Normal            +---------+------------------+-----+---------+--------+ +--------+------------------+-----+---------+-------+ Left    Lt Pressure (mmHg)IndexWaveform Comment +--------+------------------+-----+---------+-------+ [id number]                    triphasic        +--------+------------------+-----+---------+-------+ +-------+-----------+-----------+------------+------------+ ABI/TBIToday's ABIToday's TBIPrevious ABIPrevious TBI +-------+-----------+-----------+------------+------------+ Right  1.12       0.97       1.1                      +-------+-----------+-----------+------------+------------+ Left   BKA        BKA        0.35                     +-------+-----------+-----------+------------+------------+  Summary: Right: Resting right ankle-brachial index is within normal range. The right toe-brachial index is normal. *See table(s) above for measurements and observations.  Electronically signed by Deitra Mayo MD on 11/09/2022 at 7:32:45 PM.    Final    DG Foot 2 Views Right  Result Date: 11/09/2022 CLINICAL DATA:  Wound EXAM: RIGHT FOOT - 2 VIEW COMPARISON:  10/15/2022. FINDINGS: First metatarsophalangeal degenerative changes with joint space narrowing and osteophytes. 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 Soft tissue defect consistent  with ulceration posterior to the calcaneus. Large posterior calcaneal Spur. No bony destructive process identified. IMPRESSION: Degenerative changes. Soft tissue defect consistent with open wound. Electronically Signed   By: Sammie Bench M.D.   On: 11/09/2022 10:50    Zethan Alfieri M. Cruzita Lederer, MD, PhD [facility]  Between 7 am - 7 pm you can contact me via Amion (for emergencies) or [place] (non urgent matters).  I am not available 7 pm - 7 am, please contact night coverage MD/APP via Amion

## 2022-11-10 NOTE — Progress Notes (Signed)
Patient ID: [name], male   DOB: 03-31-1956, 67 y.o.   MRN: [id number]   [place]  REQUESTING PHYSICIAN: [name], MD  Chief Complaint: Necrotic gangrenous right heel ulcer.  HPI: [name] is a 67 y.o. male who presents with necrotic gangrenous right heel ulcer.  Patient has undergone prolonged conservative therapy with pressure offloading.  Patient is status post a left transtibial amputation.  Past Medical History:  Diagnosis Date   Critical lower limb ischemia (HCC)    ETOH abuse    Gout    Hypertension    Past Surgical History:  Procedure Laterality Date   ABDOMINAL AORTAGRAM N/A 12/26/2013   Procedure: ABDOMINAL Maxcine Ham;  Surgeon: Serafina Mitchell, MD;  Location: [name];  Service: Cardiovascular;  Laterality: N/A;   AMPUTATION Left 12/28/2013   Procedure: LEFT BELOW KNEE AMPUTATION;  Surgeon: Newt Minion, MD;  Location: [place];  Service: Orthopedics;  Laterality: Left;   BELOW KNEE LEG AMPUTATION     Left   LEG SURGERY     Social History   Socioeconomic History   Marital status: Single    Spouse name: Not on file   Number of children: Not on file   Years of education: Not on file   Highest education level: Not on file  Occupational History   Not on file  Tobacco Use   Smoking status: Every Day    Packs/day: 0.50    Years: 40.00    Total pack years: 20.00    Types: Cigarettes   Smokeless tobacco: Never  Vaping Use   Vaping Use: Every day  Substance and Sexual Activity   Alcohol use: Yes    Alcohol/week: 120.0 standard drinks of alcohol    Types: 120 Cans of beer per week    Comment: 2 -3 40 oz beers per day   Drug use: Yes    Types: Cocaine, Marijuana    Comment: crack   Sexual activity: Not on file  Other Topics Concern   Not on file  Social History Narrative   Not on file   Social Determinants of Health   Financial Resource Strain: Not on file  Food Insecurity: No Food Insecurity (10/17/2022)   Hunger  Vital Sign    Worried About Running Out of Food in the Last Year: Never true    Ran Out of Food in the Last Year: Never true  Recent Concern: Food Insecurity - Food Insecurity Present (09/17/2022)   Hunger Vital Sign    Worried About Running Out of Food in the Last Year: Sometimes true    Ran Out of Food in the Last Year: Sometimes true  Transportation Needs: Unmet Transportation Needs (10/17/2022)   PRAPARE - Hydrologist (Medical): Yes    Lack of Transportation (Non-Medical): Yes  Physical Activity: Not on file  Stress: Not on file  Social Connections: Not on file   Family History  Problem Relation Age of Onset   Diabetes Mellitus II Mother    CAD Neg Hx    Stroke Neg Hx    - negative except otherwise stated in the family history section No Known Allergies Prior to Admission medications   Medication Sig Start Date End Date Taking? Authorizing Provider  albuterol (VENTOLIN HFA) 108 (90 Base) MCG/ACT inhaler Inhale 2 puffs into the lungs every 6 (six) hours as needed for wheezing or shortness of breath. 09/17/22  Yes Patrecia Pour, MD  amLODipine (NORVASC) 10 MG  tablet Take 1 tablet (10 mg total) by mouth daily. 09/17/22  Yes Patrecia Pour, MD  lisinopril (ZESTRIL) 10 MG tablet Take 1 tablet (10 mg total) by mouth daily. 09/17/22  Yes Patrecia Pour, MD  fluticasone furoate-vilanterol (BREO ELLIPTA) 100-25 MCG/ACT AEPB Inhale 1 puff into the lungs daily. Patient not taking: Reported on 10/18/2022 09/17/22   Patrecia Pour, MD  folic acid (FOLVITE) 1 MG tablet Take 1 tablet (1 mg total) by mouth daily. Patient not taking: Reported on 10/18/2022 04/17/22   Shelly Coss, MD  guaiFENesin-dextromethorphan (ROBITUSSIN DM) 100-10 MG/5ML syrup Take 10 mLs by mouth every 4 (four) hours as needed for cough. Patient not taking: Reported on 10/18/2022 09/28/22   Hosie Poisson, MD  Multiple Vitamin (MULTIVITAMIN WITH MINERALS) TABS tablet Take 1 tablet by mouth daily. Patient not  taking: Reported on 10/18/2022 09/29/22   Hosie Poisson, MD  pantoprazole (PROTONIX) 40 MG tablet Take 1 tablet (40 mg total) by mouth daily. Patient not taking: Reported on 10/18/2022 09/29/22   Hosie Poisson, MD  thiamine (VITAMIN B1) 100 MG tablet Take 1 tablet (100 mg total) by mouth daily. Patient not taking: Reported on 10/18/2022 09/17/22   Patrecia Pour, MD  ADVAIR [name] [phone] MCG/ACT inhaler Inhale 2 puffs into the lungs 2 (two) times daily. 04/16/22 09/17/22  Shelly Coss, MD   VAS [place] ABI WITH/WO TBI  Result Date: 11/09/2022  LOWER EXTREMITY DOPPLER STUDY Patient Name:  [name]  Date of Exam:   11/09/2022 Medical Rec #: [id number]        Accession #:    [id number] Date of Birth: 24-Dec-1955        Patient Gender: M Patient Age:   7 years Exam Location:  [name] Procedure:      VAS [place] ABI WITH/WO TBI Referring Phys: MICHAEL JEFFERY --------------------------------------------------------------------------------  High Risk Factors: Hypertension, current smoker. Other Factors: HX of PAD, HX LT BKA.  Comparison Study: Previosu exam 12/25/13 Performing Technologist: Rogelia Rohrer RVT, RDMS  Examination Guidelines: A complete evaluation includes at minimum, Doppler waveform signals and systolic blood pressure reading at the level of bilateral brachial, anterior tibial, and posterior tibial arteries, when vessel segments are accessible. Bilateral testing is considered an integral part of a complete examination. Photoelectric Plethysmograph (PPG) waveforms and toe systolic pressure readings are included as required and additional duplex testing as needed. Limited examinations for reoccurring indications may be performed as noted.  ABI Findings: +---------+------------------+-----+---------+--------+ Right    Rt Pressure (mmHg)IndexWaveform Comment  +---------+------------------+-----+---------+--------+ Brachial 154                    triphasic          +---------+------------------+-----+---------+--------+ PTA      139               0.90 triphasic         +---------+------------------+-----+---------+--------+ DP       173               1.12 triphasic         +---------+------------------+-----+---------+--------+ Great Toe149               0.97 Normal            +---------+------------------+-----+---------+--------+ +--------+------------------+-----+---------+-------+ Left    Lt Pressure (mmHg)IndexWaveform Comment +--------+------------------+-----+---------+-------+ [id number]                    triphasic        +--------+------------------+-----+---------+-------+ +-------+-----------+-----------+------------+------------+  ABI/TBIToday's ABIToday's TBIPrevious ABIPrevious TBI +-------+-----------+-----------+------------+------------+ Right  1.12       0.97       1.1                      +-------+-----------+-----------+------------+------------+ Left   BKA        BKA        0.35                     +-------+-----------+-----------+------------+------------+  Summary: Right: Resting right ankle-brachial index is within normal range. The right toe-brachial index is normal. *See table(s) above for measurements and observations.  Electronically signed by Deitra Mayo MD on 11/09/2022 at 7:32:45 PM.    Final    DG Foot 2 Views Right  Result Date: 11/09/2022 CLINICAL DATA:  Wound EXAM: RIGHT FOOT - 2 VIEW COMPARISON:  10/15/2022. FINDINGS: First metatarsophalangeal degenerative changes with joint space narrowing and osteophytes. 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 Soft tissue defect consistent with ulceration posterior to the calcaneus. Large posterior calcaneal Spur. No bony destructive process identified. IMPRESSION: Degenerative changes. Soft tissue defect consistent with open wound. Electronically Signed   By: Sammie Bench M.D.   On: 11/09/2022 10:50   - pertinent xrays, CT,  MRI studies were reviewed and independently interpreted  Positive ROS: All other systems have been reviewed and were otherwise negative with the exception of those mentioned in the HPI and as above.  Physical Exam: General: Alert, no acute distress Psychiatric: Patient is competent for consent with normal mood and affect Lymphatic: No axillary or cervical lymphadenopathy Cardiovascular: No pedal edema Respiratory: No cyanosis, no use of accessory musculature GI: No organomegaly, abdomen is soft and non-tender    Images:  '@ENCIMAGES'$ @  Labs:  Lab Results  Component Value Date   HGBA1C 5.6 09/02/2022   HGBA1C 5.9 (H) 11/09/2013   ESRSEDRATE 22 (H) 10/19/2022   ESRSEDRATE 31 (H) 11/07/2013   CRP 0.7 10/19/2022   CRP 5.6 (H) 11/07/2013   LABURIC 8.8 (H) 11/06/2013   REPTSTATUS 10/19/2022 FINAL 10/16/2022   GRAMSTAIN  10/16/2022    FEW WBC PRESENT, PREDOMINANTLY PMN NO ORGANISMS SEEN Performed at [name], [address] [place], [place] [address]    CULT RARE PROTEUS MIRABILIS 10/16/2022   LABORGA PROTEUS MIRABILIS 10/16/2022    Lab Results  Component Value Date   ALBUMIN 3.4 (L) 11/01/2022   ALBUMIN 3.8 10/15/2022   ALBUMIN 3.1 (L) 09/19/2022   LABURIC 8.8 (H) 11/06/2013        Latest Ref Rng & Units 11/08/2022    2:57 AM 11/01/2022    4:58 AM 10/27/2022    3:13 AM  CBC EXTENDED  WBC 4.0 - 10.5 K/uL 7.5  7.1  8.4   RBC 4.22 - 5.81 MIL/uL 3.34  3.03  2.95   Hemoglobin 13.0 - 17.0 g/dL 10.5  9.7  9.4   HCT 39.0 - 52.0 % 31.8  29.1  27.9   Platelets 150 - 400 K/uL 405  472  521     Neurologic: Patient does not have protective sensation bilateral lower extremities.   MUSCULOSKELETAL:   Skin: Examination patient has a large foul-smelling necrotic ulcer over the right calcaneus.  The ulcer probes to bone.  There is no ascending cellulitis.  Patient has a stable left below-knee amputation.  Patient's ankle-brachial indices show triphasic flow at the  ankle.  White cell count 7.5 hemoglobin 10.5.  MRI is pending.  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  Plan: Plan: Recommended proceeding with a transtibial amputation on the right.  Patient states he would like to stay in the [name] for 2 weeks prior to proceeding with surgery.  I will follow-up and anticipate surgery next Wednesday.  Thank you for the consult and the opportunity to see Mr. Terion Grosjean, [place] [phone] 8:17 AM

## 2022-11-10 NOTE — Progress Notes (Signed)
Nutrition Follow-up  DOCUMENTATION CODES:   Severe malnutrition in context of chronic illness  INTERVENTION:  Encourage adequate PO intake with emphasis on protein containing foods Nepro Shake po once daily, each supplement provides 425 kcal and 19 grams protein MVI with minerals daily Juven BID to support wound healing  NUTRITION DIAGNOSIS:   Severe Malnutrition related to chronic illness (COPD, polysubstance abuse) as evidenced by severe fat depletion, severe muscle depletion. - remains applicable  GOAL:   Patient will meet greater than or equal to 90% of their needs - goal met  MONITOR:   PO intake, Labs, Weight trends  REASON FOR ASSESSMENT:   Ventilator, Consult Enteral/tube feeding initiation and management  ASSESSMENT:   67 year old male who presented to the ED on 2/02 with SOB, chest pain, and AMS. PMH of COPD, ETOH abuse, cocaine abuse, noncompliance, DM, PVD s/p L BKA. Pt required intubation. Pt admitted with acute respiratory failure, hyponatremia.  2/3: extubated 2/4: s/p BSE- recommend dysphagia 3, nectar thick liquids  2/10: s/p MBS-continue dysphagia 3, liquids adv to thin   Pt now with osteomyelitis R calcaneous with necrotic R heel ulcer. Ortho plans for R transtibial amputation likely in 1 week.   Spoke with pt at bedside. He is in good spirits. He endorses ongoing great appetite. In addition to meals, he is provided snacks from the unit as well which he is pleased with. He continues to consume Nepro shakes. Juven supplements added to support wound healing which pt is agreeable to trying.   Medications: colace, folvite, mag-ox, MVI, protonix, miralax, thiamine  Labs: reviewed  Diet Order:   Diet Order             DIET DYS 3 Room service appropriate? No; Fluid consistency: Thin  Diet effective now                   EDUCATION NEEDS:   Not appropriate for education at this time  Skin:  Skin Assessment: Skin Integrity Issues: Skin  Integrity Issues:: Stage II Stage II: L elbow, L ischial tuberosity  Last BM:  2/27 (type 3-medium)  Height:   Ht Readings from Last 1 Encounters:  10/15/22 '5\' 7"'$  (1.702 m)    Weight:   Wt Readings from Last 1 Encounters:  11/10/22 65.5 kg   BMI:  Body mass index is 22.62 kg/m.  Estimated Nutritional Needs:   Kcal:  1850-2050  Protein:  85-105 grams  Fluid:  1.8-2.0 L  [name], RDN, LDN Clinical Nutrition

## 2022-11-11 DIAGNOSIS — J9601 Acute respiratory failure with hypoxia: Secondary | ICD-10-CM | POA: Diagnosis not present

## 2022-11-11 DIAGNOSIS — J9602 Acute respiratory failure with hypercapnia: Secondary | ICD-10-CM | POA: Diagnosis not present

## 2022-11-11 NOTE — Progress Notes (Signed)
PROGRESS NOTE  [name] [id number] DOB: Dec 23, 1955 DOA: 10/15/2022 PCP: Demetrios Isaacs, FNP   LOS: 27 days   Brief Narrative / Interim history: 67 year old male with with prior polysubstance abuse, EtOH, cocaine, noncompliance, right heel unstageable pressure injury, PVD who comes into the hospital with altered mental status.  He was recently admitted and discharged home as he refused SNF, and came back to the hospital confused, hypoxic, hypercarbic and noted to be aspirating.  He was initially required to the ICU requiring intubation.  Eventually extubated on 2/3 and transferred to [facility] on 2/5.  Currently stable, awaiting SNF  Subjective / 24h Interval events: Feels well.  Awaiting surgery next week  Assesement and Plan: Principal Problem:   Respiratory failure ([place]) Active Problems:   Alcohol abuse   Acute osteomyelitis of right calcaneus (HCC)   PVD (peripheral vascular disease) ([place])  Principal problem Right jaw pain -CT scan with out abscess, PRN ibuprofen -PO abx x 5 days, today is day 4  Active problems Unstageable right heel pressure injury, POA -appears worsened -MRI showing osteomyelitis, Ortho planned surgery next week  Acute hypoxic and hypercarbic respiratory failure -initially requiring intubation, eventually extubated and now he is stable on room air.  Proteus mirabilis pneumonia -status post course of antibiotics  Acute on Chronic Hyponatremia in the setting of beer potomania - On admission serum sodium 112, currently in the upper 120s to low 130s which  is his baseline   Dysphagia  -advanced to thin liquids -aspiration precautions   Hypokalemia, hypomagnesemia -continue to monitor and replete as indicated  Prediabetes, blood sugars are well-controlled - A1c 5.6 on 09/02/2022.   Chronic EtOH abuse with concern for withdrawal, cocaine abuse - UDS positive for cocaine on 09/20/2022. Continue folic acid supplement, thiamine supplement, multivitamins, out  of window for alcohol withdrawal.   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 - Continue home regimen   Right elbow swelling- Cellulitis vs thrombophlebitis  -no fracture, resolved    Scheduled Meds:  amoxicillin-clavulanate  1 tablet Oral Q12H   docusate  100 mg Oral BID   feeding supplement (NEPRO CARB STEADY)  237 mL Oral Q24H   fluticasone furoate-vilanterol  1 puff Inhalation Daily   folic acid  1 mg Oral Daily   heparin  5,000 Units Subcutaneous Q8H   magnesium oxide  400 mg Oral Daily   multivitamin with minerals  1 tablet Oral Daily   nutrition supplement (JUVEN)  1 packet Oral BID BM   pantoprazole  40 mg Oral Daily   polyethylene glycol  17 g Oral Daily   sodium hypochlorite   Irrigation BID   thiamine  100 mg Oral Daily   Continuous Infusions: PRN Meds:.acetaminophen, albuterol, benzocaine, HYDROcodone-acetaminophen, mouth rinse, polyethylene glycol  Current Outpatient Medications  Medication Instructions   albuterol (VENTOLIN HFA) 108 (90 Base) MCG/ACT inhaler 2 puffs, Inhalation, Every 6 hours PRN   amLODipine (NORVASC) 10 mg, Oral, Daily   fluticasone furoate-vilanterol (BREO ELLIPTA) 100-25 MCG/ACT AEPB 1 puff, Inhalation, Daily   folic acid (FOLVITE) 1 mg, Oral, Daily   guaiFENesin-dextromethorphan (ROBITUSSIN DM) 100-10 MG/5ML syrup 10 mLs, Oral, Every 4 hours PRN   lisinopril (ZESTRIL) 10 mg, Oral, Daily   Multiple Vitamin (MULTIVITAMIN WITH MINERALS) TABS tablet 1 tablet, Oral, Daily   pantoprazole (PROTONIX) 40 mg, Oral, Daily   thiamine (VITAMIN B1) 100 mg, Oral, Daily    Diet Orders (From admission, onward)     Start     Ordered   10/23/22  1420  DIET DYS 3 Room service appropriate? No; Fluid consistency: Thin  Diet effective now       Question Answer Comment  Room service appropriate? No   Fluid consistency: Thin      10/23/22 1419            DVT prophylaxis: heparin injection 5,000 Units Start: 10/15/22 0930   Lab Results   Component Value Date   PLT 405 (H) 11/08/2022      Code Status: Full Code  Family Communication: no family at bedside  Status is: Inpatient  Remains inpatient appropriate because: awaiting placement and possible need to go back to the OR  Level of care: Med-Surg  Consultants:  PCCM  Objective: Vitals:   11/10/22 1941 11/11/22 0500 11/11/22 0543 11/11/22 0757  BP: (!) 153/83  116/66 125/68  Pulse: (!) 103  97 (!) 103  Resp: '16  16 18  '$ Temp: 98 F (36.7 C)  98.1 F (36.7 C) 98 F (36.7 C)  TempSrc: Oral  Oral   SpO2: 97%  96% 98%  Weight:  65.9 kg    Height:        Intake/Output Summary (Last 24 hours) at 11/11/2022 1104 Last data filed at 11/11/2022 0546 Gross per 24 hour  Intake 360 ml  Output 2100 ml  Net -1740 ml    Wt Readings from Last 3 Encounters:  11/11/22 65.9 kg  09/23/22 58.8 kg  09/17/22 56.8 kg    Examination:  Constitutional: NAD Respiratory: CTA Cardiovascular: RRR          Data Reviewed: I have independently reviewed following labs and imaging studies   CBC Recent Labs  Lab 11/08/22 0257  WBC 7.5  HGB 10.5*  HCT 31.8*  PLT 405*  MCV 95.2  MCH 31.4  MCHC 33.0  RDW 15.9*     Recent Labs  Lab 11/08/22 0257  NA 129*  K 4.2  CL 94*  CO2 26  GLUCOSE 119*  BUN 10  CREATININE 0.58*  CALCIUM 9.1     ------------------------------------------------------------------------------------------------------------------ No results for input(s): "CHOL", "HDL", "LDLCALC", "TRIG", "CHOLHDL", "LDLDIRECT" in the last 72 hours.  Lab Results  Component Value Date   HGBA1C 5.6 09/02/2022   ------------------------------------------------------------------------------------------------------------------ No results for input(s): "TSH", "T4TOTAL", "T3FREE", "THYROIDAB" in the last 72 hours.  Invalid input(s): "FREET3"  Cardiac Enzymes No results for input(s): "CKMB", "TROPONINI", "MYOGLOBIN" in the last 168 hours.  Invalid  input(s): "CK" ------------------------------------------------------------------------------------------------------------------    Component Value Date/Time   BNP 71.0 10/15/2022 0636    CBG: No results for input(s): "GLUCAP" in the last 168 hours.   No results found for this or any previous visit (from the past 240 hour(s)).   Radiology Studies: MR FOOT RIGHT W WO CONTRAST  Result Date: 11/10/2022 CLINICAL DATA:  Right heel ulcer. EXAM: MRI OF THE RIGHT ANKLE WITHOUT AND WITH CONTRAST TECHNIQUE: Multiplanar, multisequence MR imaging of the right ankle was performed before and after the administration of intravenous contrast. CONTRAST:  49m GADAVIST GADOBUTROL 1 MMOL/ML IV SOLN COMPARISON:  Right foot x-rays from yesterday. FINDINGS: TENDONS Peroneal: Peroneal longus tendon intact. Peroneal brevis intact. Posteromedial: Posterior tibial tendon intact. Flexor digitorum longus tendon intact. Flexor hallucis longus tendon intact. Anterior: Tibialis anterior tendon intact. Extensor hallucis longus tendon intact. Extensor digitorum longus tendon intact. Achilles:  Intact. Plantar Fascia: Intact. LIGAMENTS Lateral: Anterior talofibular ligament intact. Calcaneofibular ligament intact. Posterior talofibular ligament intact. Anterior and posterior tibiofibular ligaments intact. Medial: Deltoid ligament intact. Spring ligament intact.  CARTILAGE Ankle Joint: No significant joint effusion. Diffuse cartilage thinning. Small focal full-thickness cartilage defect over the central tibial plafond with underlying subchondral marrow edema. Subtalar Joints/Sinus Tarsi: Normal subtalar joints. No subtalar joint effusion. Normal sinus tarsi. Bones: Mild marrow edema and enhancement in the calcaneal tuberosity (series 11, image 13), with early decreased T1 marrow signal (series 3, image 27). 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 Soft Tissue: Large soft tissue ulceration of the heel  extending close to bone. Surrounding soft tissue swelling and enhancement without fluid collection. Increased T2 signal within and atrophy of the intrinsic muscles of the forefoot, nonspecific, but likely related to diabetic muscle changes. IMPRESSION: 1. Large soft tissue ulceration of the heel extending close to bone with underlying early osteomyelitis of the calcaneal tuberosity. No abscess. Electronically Signed   By: Titus Dubin M.D.   On: 11/10/2022 11:29    Lucylle Foulkes M. Cruzita Lederer, MD, PhD [facility]  Between 7 am - 7 pm you can contact me via Amion (for emergencies) or [place] (non urgent matters).  I am not available 7 pm - 7 am, please contact night coverage MD/APP via Amion

## 2022-11-12 DIAGNOSIS — J9601 Acute respiratory failure with hypoxia: Secondary | ICD-10-CM | POA: Diagnosis present

## 2022-11-12 DIAGNOSIS — E1152 Type 2 diabetes mellitus with diabetic peripheral angiopathy with gangrene: Secondary | ICD-10-CM | POA: Diagnosis present

## 2022-11-12 DIAGNOSIS — Z89512 Acquired absence of left leg below knee: Secondary | ICD-10-CM | POA: Diagnosis not present

## 2022-11-12 DIAGNOSIS — J9602 Acute respiratory failure with hypercapnia: Secondary | ICD-10-CM | POA: Diagnosis present

## 2022-11-12 DIAGNOSIS — F1410 Cocaine abuse, uncomplicated: Secondary | ICD-10-CM | POA: Diagnosis present

## 2022-11-12 DIAGNOSIS — I10 Essential (primary) hypertension: Secondary | ICD-10-CM | POA: Diagnosis present

## 2022-11-12 DIAGNOSIS — G9341 Metabolic encephalopathy: Secondary | ICD-10-CM | POA: Diagnosis present

## 2022-11-12 DIAGNOSIS — D649 Anemia, unspecified: Secondary | ICD-10-CM | POA: Diagnosis present

## 2022-11-12 DIAGNOSIS — I70221 Atherosclerosis of native arteries of extremities with rest pain, right leg: Secondary | ICD-10-CM | POA: Diagnosis present

## 2022-11-12 DIAGNOSIS — E871 Hypo-osmolality and hyponatremia: Secondary | ICD-10-CM | POA: Diagnosis present

## 2022-11-12 DIAGNOSIS — L89610 Pressure ulcer of right heel, unstageable: Secondary | ICD-10-CM | POA: Diagnosis present

## 2022-11-12 DIAGNOSIS — E1169 Type 2 diabetes mellitus with other specified complication: Secondary | ICD-10-CM | POA: Diagnosis present

## 2022-11-12 DIAGNOSIS — L89322 Pressure ulcer of left buttock, stage 2: Secondary | ICD-10-CM | POA: Diagnosis present

## 2022-11-12 DIAGNOSIS — Z79899 Other long term (current) drug therapy: Secondary | ICD-10-CM | POA: Diagnosis not present

## 2022-11-12 DIAGNOSIS — J440 Chronic obstructive pulmonary disease with acute lower respiratory infection: Secondary | ICD-10-CM | POA: Diagnosis present

## 2022-11-12 DIAGNOSIS — M86171 Other acute osteomyelitis, right ankle and foot: Secondary | ICD-10-CM | POA: Diagnosis present

## 2022-11-12 DIAGNOSIS — J1569 Pneumonia due to other gram-negative bacteria: Secondary | ICD-10-CM | POA: Diagnosis present

## 2022-11-12 DIAGNOSIS — M86271 Subacute osteomyelitis, right ankle and foot: Secondary | ICD-10-CM | POA: Diagnosis present

## 2022-11-12 DIAGNOSIS — E876 Hypokalemia: Secondary | ICD-10-CM | POA: Diagnosis present

## 2022-11-12 DIAGNOSIS — F1010 Alcohol abuse, uncomplicated: Secondary | ICD-10-CM | POA: Diagnosis present

## 2022-11-12 DIAGNOSIS — J441 Chronic obstructive pulmonary disease with (acute) exacerbation: Secondary | ICD-10-CM | POA: Diagnosis present

## 2022-11-12 DIAGNOSIS — F17210 Nicotine dependence, cigarettes, uncomplicated: Secondary | ICD-10-CM | POA: Diagnosis present

## 2022-11-12 DIAGNOSIS — Z1152 Encounter for screening for COVID-19: Secondary | ICD-10-CM | POA: Diagnosis not present

## 2022-11-12 NOTE — Progress Notes (Signed)
PROGRESS NOTE  Ronald [name] DOB: 03-22-1956 DOA: 10/15/2022 PCP: Demetrios Isaacs, FNP   LOS: 28 days   Brief Narrative / Interim history: 67 year old male with with prior polysubstance abuse, EtOH, cocaine, noncompliance, right heel unstageable pressure injury, PVD who comes into the hospital with altered mental status.  He was recently admitted and discharged home as he refused SNF, and came back to the hospital confused, hypoxic, hypercarbic and noted to be aspirating.  He was initially required to the ICU requiring intubation.  Eventually extubated on 2/3 and transferred to [facility] on 2/5.  Currently stable, awaiting SNF  Subjective / 24h Interval events: No specific complaints.  Assesement and Plan: Principal Problem:   Respiratory failure ([place]) Active Problems:   Alcohol abuse   Acute osteomyelitis of right calcaneus (HCC)   PVD (peripheral vascular disease) (HCC)  Principal problem Right jaw pain -CT scan with out abscess, PRN ibuprofen -PO abx x 5 days, finishing today  Active problems Unstageable right heel pressure injury, POA -appears worsened -MRI showing osteomyelitis, Ortho planned surgery next week  Acute hypoxic and hypercarbic respiratory failure -initially requiring intubation, eventually extubated and now he is stable on room air.  Proteus mirabilis pneumonia -status post course of antibiotics  Acute on Chronic Hyponatremia in the setting of beer potomania - On admission serum sodium 112, currently in the upper 120s to low 130s which  is his baseline   Dysphagia  -advanced to thin liquids -aspiration precautions   Hypokalemia, hypomagnesemia -continue to monitor and replete as indicated  Prediabetes, blood sugars are well-controlled - A1c 5.6 on 09/02/2022.   Chronic EtOH abuse with concern for withdrawal, cocaine abuse - UDS positive for cocaine on 09/20/2022. Continue folic acid supplement, thiamine supplement, multivitamins, out of window for  alcohol withdrawal.   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 - Continue home regimen   Right elbow swelling- Cellulitis vs thrombophlebitis  -no fracture, resolved    Scheduled Meds:  amoxicillin-clavulanate  1 tablet Oral Q12H   docusate  100 mg Oral BID   feeding supplement (NEPRO CARB STEADY)  237 mL Oral Q24H   fluticasone furoate-vilanterol  1 puff Inhalation Daily   folic acid  1 mg Oral Daily   heparin  5,000 Units Subcutaneous Q8H   magnesium oxide  400 mg Oral Daily   multivitamin with minerals  1 tablet Oral Daily   nutrition supplement (JUVEN)  1 packet Oral BID BM   pantoprazole  40 mg Oral Daily   polyethylene glycol  17 g Oral Daily   sodium hypochlorite   Irrigation BID   thiamine  100 mg Oral Daily   Continuous Infusions: PRN Meds:.acetaminophen, albuterol, benzocaine, HYDROcodone-acetaminophen, mouth rinse, polyethylene glycol  Current Outpatient Medications  Medication Instructions   albuterol (VENTOLIN HFA) 108 (90 Base) MCG/ACT inhaler 2 puffs, Inhalation, Every 6 hours PRN   amLODipine (NORVASC) 10 mg, Oral, Daily   fluticasone furoate-vilanterol (BREO ELLIPTA) 100-25 MCG/ACT AEPB 1 puff, Inhalation, Daily   folic acid (FOLVITE) 1 mg, Oral, Daily   guaiFENesin-dextromethorphan (ROBITUSSIN DM) 100-10 MG/5ML syrup 10 mLs, Oral, Every 4 hours PRN   lisinopril (ZESTRIL) 10 mg, Oral, Daily   Multiple Vitamin (MULTIVITAMIN WITH MINERALS) TABS tablet 1 tablet, Oral, Daily   pantoprazole (PROTONIX) 40 mg, Oral, Daily   thiamine (VITAMIN B1) 100 mg, Oral, Daily    Diet Orders (From admission, onward)     Start     Ordered   10/23/22 1420  DIET DYS 3 Room  service appropriate? No; Fluid consistency: Thin  Diet effective now       Question Answer Comment  Room service appropriate? No   Fluid consistency: Thin      10/23/22 1419            DVT prophylaxis: heparin injection 5,000 Units Start: 10/15/22 0930   Lab Results  Component Value Date    PLT 405 (H) 11/08/2022      Code Status: Full Code  Family Communication: no family at bedside  Status is: Inpatient  Remains inpatient appropriate because: awaiting placement and possible need to go back to the OR  Level of care: Med-Surg  Consultants:  PCCM  Objective: Vitals:   11/11/22 2129 11/12/22 0338 11/12/22 0340 11/12/22 0922  BP: (!) 140/79 (!) 145/75  135/78  Pulse: (!) 103 (!) 101  (!) 101  Resp: '18 18  16  '$ Temp: 98.8 F (37.1 C) 98.8 F (37.1 C)  98.7 F (37.1 C)  TempSrc: Oral Oral  Oral  SpO2: 98% 97%  98%  Weight:   68.9 kg   Height:        Intake/Output Summary (Last 24 hours) at 11/12/2022 1134 Last data filed at 11/12/2022 0900 Gross per 24 hour  Intake 360 ml  Output 2800 ml  Net -2440 ml    Wt Readings from Last 3 Encounters:  11/12/22 68.9 kg  09/23/22 58.8 kg  09/17/22 56.8 kg    Examination:  Constitutional: NAD Respiratory: CTA Cardiovascular: RRR  Data Reviewed: I have independently reviewed following labs and imaging studies   CBC Recent Labs  Lab 11/08/22 0257  WBC 7.5  HGB 10.5*  HCT 31.8*  PLT 405*  MCV 95.2  MCH 31.4  MCHC 33.0  RDW 15.9*     Recent Labs  Lab 11/08/22 0257  NA 129*  K 4.2  CL 94*  CO2 26  GLUCOSE 119*  BUN 10  CREATININE 0.58*  CALCIUM 9.1     ------------------------------------------------------------------------------------------------------------------ No results for input(s): "CHOL", "HDL", "LDLCALC", "TRIG", "CHOLHDL", "LDLDIRECT" in the last 72 hours.  Lab Results  Component Value Date   HGBA1C 5.6 09/02/2022   ------------------------------------------------------------------------------------------------------------------ No results for input(s): "TSH", "T4TOTAL", "T3FREE", "THYROIDAB" in the last 72 hours.  Invalid input(s): "FREET3"  Cardiac Enzymes No results for input(s): "CKMB", "TROPONINI", "MYOGLOBIN" in the last 168 hours.  Invalid input(s):  "CK" ------------------------------------------------------------------------------------------------------------------    Component Value Date/Time   BNP 71.0 10/15/2022 0636    CBG: No results for input(s): "GLUCAP" in the last 168 hours.   No results found for this or any previous visit (from the past 240 hour(s)).   Radiology Studies: No results found.  Ronald Dimmitt M. Cruzita Lederer, MD, PhD [facility]  Between 7 am - 7 pm you can contact me via Amion (for emergencies) or [place] (non urgent matters).  I am not available 7 pm - 7 am, please contact night coverage MD/APP via Amion

## 2022-11-13 DIAGNOSIS — J9602 Acute respiratory failure with hypercapnia: Secondary | ICD-10-CM | POA: Diagnosis not present

## 2022-11-13 DIAGNOSIS — J9601 Acute respiratory failure with hypoxia: Secondary | ICD-10-CM | POA: Diagnosis not present

## 2022-11-13 NOTE — Plan of Care (Signed)
  Problem: Coping: Goal: Ability to adjust to condition or change in health will improve Outcome: Progressing   Problem: Fluid Volume: Goal: Ability to maintain a balanced intake and output will improve Outcome: Progressing   Problem: Metabolic: Goal: Ability to maintain appropriate glucose levels will improve Outcome: Progressing   Problem: Nutritional: Goal: Maintenance of adequate nutrition will improve Outcome: Progressing   Problem: Clinical Measurements: Goal: Respiratory complications will improve Outcome: Progressing   Problem: Clinical Measurements: Goal: Cardiovascular complication will be avoided Outcome: Progressing

## 2022-11-13 NOTE — Progress Notes (Signed)
PROGRESS NOTE  [name] PX:[id number] DOB: 1956-09-10 DOA: 10/15/2022 PCP: Demetrios Isaacs, FNP   LOS: 29 days   Brief Narrative / Interim history: 67 year old male with with prior polysubstance abuse, EtOH, cocaine, noncompliance, right heel unstageable pressure injury, PVD who comes into the hospital with altered mental status.  He was recently admitted and discharged home as he refused SNF, and came back to the hospital confused, hypoxic, hypercarbic and noted to be aspirating.  He was initially required to the ICU requiring intubation.  Eventually extubated on 2/3 and transferred to [facility] on 2/5.  Currently stable, awaiting SNF  Subjective / 24h Interval events: Comfortable, no overnight events, no complaints  Assesement and Plan: Principal Problem:   Respiratory failure ([place]) Active Problems:   Alcohol abuse   Acute osteomyelitis of right calcaneus (HCC)   PVD (peripheral vascular disease) (HCC)  Principal problem Right jaw pain -CT scan with out abscess, PRN ibuprofen.  Status post 5 days of Augmentin  Active problems Unstageable right heel pressure injury, POA -appears worsened -MRI showing early osteomyelitis, Ortho planning surgery next week.  Afebrile, no leukocytosis, no apparent cellulitis  Acute hypoxic and hypercarbic respiratory failure -initially requiring intubation, eventually extubated and now he is stable on room air.  Proteus mirabilis pneumonia -finished a course of antibiotics  Acute on Chronic Hyponatremia in the setting of beer potomania - On admission serum sodium 112, currently in the upper 120s to low 130s which  is his baseline   Dysphagia  -advanced to thin liquids -aspiration precautions   Hypokalemia, hypomagnesemia -continue to monitor and replete as indicated  Prediabetes, blood sugars are well-controlled - A1c 5.6 on 09/02/2022.   Chronic EtOH abuse with concern for withdrawal, cocaine abuse - UDS positive for cocaine on 09/20/2022.  Continue folic acid supplement, thiamine supplement, multivitamins, out of window for alcohol withdrawal.   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 - Continue home regimen   Right elbow swelling- Cellulitis vs thrombophlebitis  -no fracture, resolved    Scheduled Meds:  amoxicillin-clavulanate  1 tablet Oral Q12H   docusate  100 mg Oral BID   feeding supplement (NEPRO CARB STEADY)  237 mL Oral Q24H   fluticasone furoate-vilanterol  1 puff Inhalation Daily   folic acid  1 mg Oral Daily   heparin  5,000 Units Subcutaneous Q8H   magnesium oxide  400 mg Oral Daily   multivitamin with minerals  1 tablet Oral Daily   nutrition supplement (JUVEN)  1 packet Oral BID BM   pantoprazole  40 mg Oral Daily   polyethylene glycol  17 g Oral Daily   sodium hypochlorite   Irrigation BID   thiamine  100 mg Oral Daily   Continuous Infusions: PRN Meds:.acetaminophen, albuterol, benzocaine, HYDROcodone-acetaminophen, mouth rinse, polyethylene glycol  Current Outpatient Medications  Medication Instructions   albuterol (VENTOLIN HFA) 108 (90 Base) MCG/ACT inhaler 2 puffs, Inhalation, Every 6 hours PRN   amLODipine (NORVASC) 10 mg, Oral, Daily   fluticasone furoate-vilanterol (BREO ELLIPTA) 100-25 MCG/ACT AEPB 1 puff, Inhalation, Daily   folic acid (FOLVITE) 1 mg, Oral, Daily   guaiFENesin-dextromethorphan (ROBITUSSIN DM) 100-10 MG/5ML syrup 10 mLs, Oral, Every 4 hours PRN   lisinopril (ZESTRIL) 10 mg, Oral, Daily   Multiple Vitamin (MULTIVITAMIN WITH MINERALS) TABS tablet 1 tablet, Oral, Daily   pantoprazole (PROTONIX) 40 mg, Oral, Daily   thiamine (VITAMIN B1) 100 mg, Oral, Daily    Diet Orders (From admission, onward)     Start  Ordered   10/23/22 1420  DIET DYS 3 Room service appropriate? No; Fluid consistency: Thin  Diet effective now       Question Answer Comment  Room service appropriate? No   Fluid consistency: Thin      10/23/22 1419            DVT prophylaxis:  heparin injection 5,000 Units Start: 10/15/22 0930   Lab Results  Component Value Date   PLT 405 (H) 11/08/2022      Code Status: Full Code  Family Communication: no family at bedside  Status is: Inpatient  Remains inpatient appropriate because: awaiting placement and possible need to go back to the OR  Level of care: Med-Surg  Consultants:  PCCM  Objective: Vitals:   11/12/22 2004 11/13/22 0622 11/13/22 0752 11/13/22 0759  BP: (!) 143/85 125/67 (!) 149/83   Pulse: (!) 105 (!) 107 (!) 105 (!) 105  Resp: '15 18 15 15  '$ Temp: 98.4 F (36.9 C) 98.2 F (36.8 C) 98.3 F (36.8 C)   TempSrc: Oral Oral Oral   SpO2: 98% 98% 97%   Weight:      Height:        Intake/Output Summary (Last 24 hours) at 11/13/2022 1106 Last data filed at 11/13/2022 1000 Gross per 24 hour  Intake 480 ml  Output 2000 ml  Net -1520 ml    Wt Readings from Last 3 Encounters:  11/12/22 68.9 kg  09/23/22 58.8 kg  09/17/22 56.8 kg    Examination:  Constitutional: NAD Respiratory: CTA Cardiovascular: RRR  Data Reviewed: I have independently reviewed following labs and imaging studies   CBC Recent Labs  Lab 11/08/22 0257  WBC 7.5  HGB 10.5*  HCT 31.8*  PLT 405*  MCV 95.2  MCH 31.4  MCHC 33.0  RDW 15.9*     Recent Labs  Lab 11/08/22 0257  NA 129*  K 4.2  CL 94*  CO2 26  GLUCOSE 119*  BUN 10  CREATININE 0.58*  CALCIUM 9.1     ------------------------------------------------------------------------------------------------------------------ No results for input(s): "CHOL", "HDL", "LDLCALC", "TRIG", "CHOLHDL", "LDLDIRECT" in the last 72 hours.  Lab Results  Component Value Date   HGBA1C 5.6 09/02/2022   ------------------------------------------------------------------------------------------------------------------ No results for input(s): "TSH", "T4TOTAL", "T3FREE", "THYROIDAB" in the last 72 hours.  Invalid input(s): "FREET3"  Cardiac Enzymes No results for  input(s): "CKMB", "TROPONINI", "MYOGLOBIN" in the last 168 hours.  Invalid input(s): "CK" ------------------------------------------------------------------------------------------------------------------    Component Value Date/Time   BNP 71.0 10/15/2022 0636    CBG: No results for input(s): "GLUCAP" in the last 168 hours.   No results found for this or any previous visit (from the past 240 hour(s)).   Radiology Studies: No results found.  Rabab Currington M. Cruzita Lederer, MD, PhD [facility]  Between 7 am - 7 pm you can contact me via Amion (for emergencies) or [place] (non urgent matters).  I am not available 7 pm - 7 am, please contact night coverage MD/APP via Amion

## 2022-11-13 NOTE — Plan of Care (Signed)
  Problem: Education: Goal: Ability to describe self-care measures that may prevent or decrease complications (Diabetes Survival Skills Education) will improve Outcome: Progressing   Problem: Coping: Goal: Ability to adjust to condition or change in health will improve Outcome: Progressing   Problem: Fluid Volume: Goal: Ability to maintain a balanced intake and output will improve Outcome: Progressing   Problem: Health Behavior/Discharge Planning: Goal: Ability to identify and utilize available resources and services will improve Outcome: Progressing Goal: Ability to manage health-related needs will improve Outcome: Progressing   Problem: Metabolic: Goal: Ability to maintain appropriate glucose levels will improve Outcome: Progressing   Problem: Nutritional: Goal: Maintenance of adequate nutrition will improve Outcome: Progressing Goal: Progress toward achieving an optimal weight will improve Outcome: Progressing   Problem: Skin Integrity: Goal: Risk for impaired skin integrity will decrease Outcome: Progressing   Problem: Tissue Perfusion: Goal: Adequacy of tissue perfusion will improve Outcome: Progressing   Problem: Education: Goal: Knowledge of General Education information will improve Description: Including pain rating scale, medication(s)/side effects and non-pharmacologic comfort measures Outcome: Progressing   Problem: Health Behavior/Discharge Planning: Goal: Ability to manage health-related needs will improve Outcome: Progressing   Problem: Clinical Measurements: Goal: Ability to maintain clinical measurements within normal limits will improve Outcome: Progressing Goal: Will remain free from infection Outcome: Progressing Goal: Diagnostic test results will improve Outcome: Progressing Goal: Respiratory complications will improve Outcome: Progressing Goal: Cardiovascular complication will be avoided Outcome: Progressing   Problem: Activity: Goal:  Risk for activity intolerance will decrease Outcome: Progressing   Problem: Nutrition: Goal: Adequate nutrition will be maintained Outcome: Progressing   Problem: Coping: Goal: Level of anxiety will decrease Outcome: Progressing   Problem: Elimination: Goal: Will not experience complications related to bowel motility Outcome: Progressing Goal: Will not experience complications related to urinary retention Outcome: Progressing   Problem: Pain Managment: Goal: General experience of comfort will improve Outcome: Progressing   Problem: Safety: Goal: Ability to remain free from injury will improve Outcome: Progressing   Problem: Skin Integrity: Goal: Risk for impaired skin integrity will decrease Outcome: Progressing   Problem: Safety: Goal: Non-violent Restraint(s) Outcome: Progressing

## 2022-11-14 DIAGNOSIS — I70221 Atherosclerosis of native arteries of extremities with rest pain, right leg: Secondary | ICD-10-CM | POA: Diagnosis not present

## 2022-11-14 DIAGNOSIS — E876 Hypokalemia: Secondary | ICD-10-CM | POA: Diagnosis not present

## 2022-11-14 DIAGNOSIS — I10 Essential (primary) hypertension: Secondary | ICD-10-CM | POA: Diagnosis not present

## 2022-11-14 DIAGNOSIS — M86271 Subacute osteomyelitis, right ankle and foot: Secondary | ICD-10-CM | POA: Diagnosis not present

## 2022-11-14 DIAGNOSIS — J9602 Acute respiratory failure with hypercapnia: Secondary | ICD-10-CM | POA: Diagnosis not present

## 2022-11-14 DIAGNOSIS — D649 Anemia, unspecified: Secondary | ICD-10-CM | POA: Diagnosis not present

## 2022-11-14 DIAGNOSIS — J1569 Pneumonia due to other gram-negative bacteria: Secondary | ICD-10-CM | POA: Diagnosis not present

## 2022-11-14 DIAGNOSIS — L89322 Pressure ulcer of left buttock, stage 2: Secondary | ICD-10-CM | POA: Diagnosis not present

## 2022-11-14 DIAGNOSIS — J440 Chronic obstructive pulmonary disease with acute lower respiratory infection: Secondary | ICD-10-CM | POA: Diagnosis not present

## 2022-11-14 DIAGNOSIS — E1169 Type 2 diabetes mellitus with other specified complication: Secondary | ICD-10-CM | POA: Diagnosis not present

## 2022-11-14 DIAGNOSIS — J9601 Acute respiratory failure with hypoxia: Secondary | ICD-10-CM | POA: Diagnosis not present

## 2022-11-14 DIAGNOSIS — F17210 Nicotine dependence, cigarettes, uncomplicated: Secondary | ICD-10-CM | POA: Diagnosis not present

## 2022-11-14 DIAGNOSIS — Z79899 Other long term (current) drug therapy: Secondary | ICD-10-CM | POA: Diagnosis not present

## 2022-11-14 DIAGNOSIS — Z1152 Encounter for screening for COVID-19: Secondary | ICD-10-CM | POA: Diagnosis not present

## 2022-11-14 DIAGNOSIS — E871 Hypo-osmolality and hyponatremia: Secondary | ICD-10-CM | POA: Diagnosis not present

## 2022-11-14 DIAGNOSIS — J441 Chronic obstructive pulmonary disease with (acute) exacerbation: Secondary | ICD-10-CM | POA: Diagnosis not present

## 2022-11-14 DIAGNOSIS — G9341 Metabolic encephalopathy: Secondary | ICD-10-CM | POA: Diagnosis not present

## 2022-11-14 DIAGNOSIS — E1152 Type 2 diabetes mellitus with diabetic peripheral angiopathy with gangrene: Secondary | ICD-10-CM | POA: Diagnosis not present

## 2022-11-14 DIAGNOSIS — F1010 Alcohol abuse, uncomplicated: Secondary | ICD-10-CM | POA: Diagnosis not present

## 2022-11-14 DIAGNOSIS — Z89512 Acquired absence of left leg below knee: Secondary | ICD-10-CM | POA: Diagnosis not present

## 2022-11-14 DIAGNOSIS — L89610 Pressure ulcer of right heel, unstageable: Secondary | ICD-10-CM | POA: Diagnosis not present

## 2022-11-14 DIAGNOSIS — M86171 Other acute osteomyelitis, right ankle and foot: Secondary | ICD-10-CM | POA: Diagnosis not present

## 2022-11-14 DIAGNOSIS — F1410 Cocaine abuse, uncomplicated: Secondary | ICD-10-CM | POA: Diagnosis not present

## 2022-11-14 LAB — CBC
HCT: 33.8 % — ABNORMAL LOW (ref 39.0–52.0)
Hemoglobin: 11 g/dL — ABNORMAL LOW (ref 13.0–17.0)
MCH: 31.2 pg (ref 26.0–34.0)
MCHC: 32.5 g/dL (ref 30.0–36.0)
MCV: 95.8 fL (ref 80.0–100.0)
Platelets: 403 10*3/uL — ABNORMAL HIGH (ref 150–400)
RBC: 3.53 MIL/uL — ABNORMAL LOW (ref 4.22–5.81)
RDW: 15.6 % — ABNORMAL HIGH (ref 11.5–15.5)
WBC: 7.9 10*3/uL (ref 4.0–10.5)
nRBC: 0 % (ref 0.0–0.2)

## 2022-11-14 LAB — COMPREHENSIVE METABOLIC PANEL
ALT: 11 U/L (ref 0–44)
AST: 16 U/L (ref 15–41)
Albumin: 3.8 g/dL (ref 3.5–5.0)
Alkaline Phosphatase: 53 U/L (ref 38–126)
Anion gap: 11 (ref 5–15)
BUN: 13 mg/dL (ref 8–23)
CO2: 29 mmol/L (ref 22–32)
Calcium: 9.7 mg/dL (ref 8.9–10.3)
Chloride: 94 mmol/L — ABNORMAL LOW (ref 98–111)
Creatinine, Ser: 0.5 mg/dL — ABNORMAL LOW (ref 0.61–1.24)
GFR, Estimated: >60 mL/min (ref >60)
Glucose, Bld: 110 mg/dL — ABNORMAL HIGH (ref 70–99)
Potassium: 4 mmol/L (ref 3.5–5.1)
Sodium: 134 mmol/L — ABNORMAL LOW (ref 135–145)
Total Bilirubin: 0.3 mg/dL (ref 0.3–1.2)
Total Protein: 7.4 g/dL (ref 6.5–8.1)

## 2022-11-14 LAB — MAGNESIUM: Magnesium: 1.7 mg/dL (ref 1.7–2.4)

## 2022-11-14 NOTE — Progress Notes (Signed)
PROGRESS NOTE  [name] PX:[id number] DOB: November 16, 1955 DOA: 10/15/2022 PCP: Demetrios Isaacs, FNP   LOS: 30 days   Brief Narrative / Interim history: 67 year old male with with prior polysubstance abuse, EtOH, cocaine, noncompliance, right heel unstageable pressure injury, PVD who comes into the hospital with altered mental status.  He was recently admitted and discharged home as he refused SNF, and came back to the hospital confused, hypoxic, hypercarbic and noted to be aspirating.  He was initially required to the ICU requiring intubation.  Eventually extubated on 2/3 and transferred to [facility] on 2/5.  Currently stable, awaiting SNF  Subjective / 24h Interval events: Comfortable, no overnight events, no complaints  Assesement and Plan: Principal Problem:   Respiratory failure ([place]) Active Problems:   Alcohol abuse   Acute osteomyelitis of right calcaneus (HCC)   PVD (peripheral vascular disease) (HCC)  Principal problem Right jaw pain -CT scan with out abscess, PRN ibuprofen.  Completed Augmentin  Active problems Unstageable right heel pressure injury, POA -appears worsened -MRI showing early osteomyelitis, Ortho planning surgery next week.  Afebrile, no leukocytosis, no cellulitis  Acute hypoxic and hypercarbic respiratory failure -initially requiring intubation, eventually extubated and now he is stable on room air.  Proteus mirabilis pneumonia -finished a course of antibiotics  Acute on Chronic Hyponatremia in the setting of beer potomania - On admission serum sodium 112, currently in the upper 120s to low 130s which  is his baseline   Dysphagia  -advanced to thin liquids -aspiration precautions   Hypokalemia, hypomagnesemia -continue to monitor and replete as indicated  Prediabetes, blood sugars are well-controlled - A1c 5.6 on 09/02/2022.   Chronic EtOH abuse with concern for withdrawal, cocaine abuse - UDS positive for cocaine on 09/20/2022. Continue folic acid  supplement, thiamine supplement, multivitamins, out of window for alcohol withdrawal.   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 - Continue home regimen   Right elbow swelling- Cellulitis vs thrombophlebitis  -no fracture, resolved    Scheduled Meds:  docusate  100 mg Oral BID   feeding supplement (NEPRO CARB STEADY)  237 mL Oral Q24H   fluticasone furoate-vilanterol  1 puff Inhalation Daily   folic acid  1 mg Oral Daily   heparin  5,000 Units Subcutaneous Q8H   magnesium oxide  400 mg Oral Daily   multivitamin with minerals  1 tablet Oral Daily   nutrition supplement (JUVEN)  1 packet Oral BID BM   pantoprazole  40 mg Oral Daily   polyethylene glycol  17 g Oral Daily   sodium hypochlorite   Irrigation BID   thiamine  100 mg Oral Daily   Continuous Infusions: PRN Meds:.acetaminophen, albuterol, benzocaine, HYDROcodone-acetaminophen, mouth rinse, polyethylene glycol  Current Outpatient Medications  Medication Instructions   albuterol (VENTOLIN HFA) 108 (90 Base) MCG/ACT inhaler 2 puffs, Inhalation, Every 6 hours PRN   amLODipine (NORVASC) 10 mg, Oral, Daily   fluticasone furoate-vilanterol (BREO ELLIPTA) 100-25 MCG/ACT AEPB 1 puff, Inhalation, Daily   folic acid (FOLVITE) 1 mg, Oral, Daily   guaiFENesin-dextromethorphan (ROBITUSSIN DM) 100-10 MG/5ML syrup 10 mLs, Oral, Every 4 hours PRN   lisinopril (ZESTRIL) 10 mg, Oral, Daily   Multiple Vitamin (MULTIVITAMIN WITH MINERALS) TABS tablet 1 tablet, Oral, Daily   pantoprazole (PROTONIX) 40 mg, Oral, Daily   thiamine (VITAMIN B1) 100 mg, Oral, Daily    Diet Orders (From admission, onward)     Start     Ordered   10/23/22 1420  DIET DYS 3 Room service appropriate?  No; Fluid consistency: Thin  Diet effective now       Question Answer Comment  Room service appropriate? No   Fluid consistency: Thin      10/23/22 1419            DVT prophylaxis: heparin injection 5,000 Units Start: 10/15/22 0930   Lab Results   Component Value Date   PLT 405 (H) 11/08/2022      Code Status: Full Code  Family Communication: no family at bedside  Status is: Inpatient  Remains inpatient appropriate because: awaiting placement and possible need to go back to the OR  Level of care: Med-Surg  Consultants:  PCCM  Objective: Vitals:   11/13/22 0759 11/13/22 1602 11/13/22 1922 11/14/22 0434  BP:  (!) 140/70 129/77 122/63  Pulse: (!) 105 100 (!) 103 (!) 101  Resp: '15 16 17 17  '$ Temp:  98.3 F (36.8 C) 98.4 F (36.9 C) 98.6 F (37 C)  TempSrc:  Oral Oral Oral  SpO2:  97% 98% 96%  Weight:      Height:        Intake/Output Summary (Last 24 hours) at 11/14/2022 0748 Last data filed at 11/14/2022 0600 Gross per 24 hour  Intake 840 ml  Output 4050 ml  Net -3210 ml    Wt Readings from Last 3 Encounters:  11/12/22 68.9 kg  09/23/22 58.8 kg  09/17/22 56.8 kg    Examination:  Constitutional: NAD Respiratory: CTA Cardiovascular: RRR  Data Reviewed: I have independently reviewed following labs and imaging studies   CBC Recent Labs  Lab 11/08/22 0257  WBC 7.5  HGB 10.5*  HCT 31.8*  PLT 405*  MCV 95.2  MCH 31.4  MCHC 33.0  RDW 15.9*     Recent Labs  Lab 11/08/22 0257  NA 129*  K 4.2  CL 94*  CO2 26  GLUCOSE 119*  BUN 10  CREATININE 0.58*  CALCIUM 9.1     ------------------------------------------------------------------------------------------------------------------ No results for input(s): "CHOL", "HDL", "LDLCALC", "TRIG", "CHOLHDL", "LDLDIRECT" in the last 72 hours.  Lab Results  Component Value Date   HGBA1C 5.6 09/02/2022   ------------------------------------------------------------------------------------------------------------------ No results for input(s): "TSH", "T4TOTAL", "T3FREE", "THYROIDAB" in the last 72 hours.  Invalid input(s): "FREET3"  Cardiac Enzymes No results for input(s): "CKMB", "TROPONINI", "MYOGLOBIN" in the last 168 hours.  Invalid input(s):  "CK" ------------------------------------------------------------------------------------------------------------------    Component Value Date/Time   BNP 71.0 10/15/2022 0636    CBG: No results for input(s): "GLUCAP" in the last 168 hours.   No results found for this or any previous visit (from the past 240 hour(s)).   Radiology Studies: No results found.  Amiley Shishido M. Cruzita Lederer, MD, PhD [facility]  Between 7 am - 7 pm you can contact me via Amion (for emergencies) or [place] (non urgent matters).  I am not available 7 pm - 7 am, please contact night coverage MD/APP via Amion

## 2022-11-14 NOTE — Plan of Care (Signed)
  Problem: Fluid Volume: Goal: Ability to maintain a balanced intake and output will improve Outcome: Progressing   Problem: Metabolic: Goal: Ability to maintain appropriate glucose levels will improve Outcome: Progressing   Problem: Nutritional: Goal: Maintenance of adequate nutrition will improve Outcome: Progressing   Problem: Clinical Measurements: Goal: Respiratory complications will improve Outcome: Progressing   Problem: Clinical Measurements: Goal: Cardiovascular complication will be avoided Outcome: Progressing   Problem: Elimination: Goal: Will not experience complications related to bowel motility Outcome: Progressing   Problem: Elimination: Goal: Will not experience complications related to urinary retention Outcome: Progressing

## 2022-11-15 DIAGNOSIS — J9601 Acute respiratory failure with hypoxia: Secondary | ICD-10-CM | POA: Diagnosis not present

## 2022-11-15 DIAGNOSIS — M86171 Other acute osteomyelitis, right ankle and foot: Secondary | ICD-10-CM | POA: Diagnosis not present

## 2022-11-15 DIAGNOSIS — J9602 Acute respiratory failure with hypercapnia: Secondary | ICD-10-CM | POA: Diagnosis not present

## 2022-11-15 MED ORDER — AMOXICILLIN-POT CLAVULANATE 875-125 MG PO TABS
1.0000 | ORAL_TABLET | Freq: Two times a day (BID) | ORAL | Status: AC
  Administered 2022-11-15 – 2022-11-17 (×5): 1 via ORAL
  Filled 2022-11-15 (×5): qty 1

## 2022-11-15 NOTE — Plan of Care (Signed)
  Problem: Education: Goal: Ability to describe self-care measures that may prevent or decrease complications (Diabetes Survival Skills Education) will improve Outcome: Not Progressing   Problem: Coping: Goal: Ability to adjust to condition or change in health will improve Outcome: Not Progressing   Problem: Fluid Volume: Goal: Ability to maintain a balanced intake and output will improve Outcome: Not Progressing   Problem: Health Behavior/Discharge Planning: Goal: Ability to identify and utilize available resources and services will improve Outcome: Not Progressing Goal: Ability to manage health-related needs will improve Outcome: Not Progressing   Problem: Metabolic: Goal: Ability to maintain appropriate glucose levels will improve Outcome: Not Progressing   Problem: Nutritional: Goal: Maintenance of adequate nutrition will improve Outcome: Not Progressing Goal: Progress toward achieving an optimal weight will improve Outcome: Not Progressing   Problem: Skin Integrity: Goal: Risk for impaired skin integrity will decrease Outcome: Not Progressing   Problem: Tissue Perfusion: Goal: Adequacy of tissue perfusion will improve Outcome: Not Progressing   Problem: Education: Goal: Knowledge of General Education information will improve Description: Including pain rating scale, medication(s)/side effects and non-pharmacologic comfort measures Outcome: Not Progressing

## 2022-11-15 NOTE — H&P (View-Only) (Signed)
Patient ID: Ronald [name], male   DOB: 03/10/1956, 67 y.o.   MRN: [id number] Patient is seen in follow-up for osteomyelitis right calcaneus.  Patient has an existing left transtibial amputation.  Patient states he would like to proceed with a below-knee amputation on the right on Wednesday.  Hemoglobin is stable.

## 2022-11-15 NOTE — Progress Notes (Addendum)
PROGRESS NOTE  [name] DOB: 03/07/1956 DOA: 10/15/2022 PCP: Demetrios Isaacs, FNP   LOS: 31 days   Brief Narrative / Interim history: 67 year old male with with prior polysubstance abuse, EtOH, cocaine, noncompliance, right heel unstageable pressure injury, PVD who comes into the hospital with altered mental status.  He was recently admitted and discharged home as he refused SNF, and came back to the hospital confused, hypoxic, hypercarbic and noted to be aspirating.  He was initially required to the ICU requiring intubation.  Eventually extubated on 2/3 and transferred to [facility] on 2/5.  Currently stable, awaiting SNF  Subjective / 24h Interval events: No complaints this morning, no overnight events  Assesement and Plan: Principal Problem:   Respiratory failure ([place]) Active Problems:   Alcohol abuse   Acute osteomyelitis of right calcaneus (HCC)   PVD (peripheral vascular disease) (HCC)  Principal problem Unstageable right heel pressure injury, early osteomyelitis (POA) -orthopedic surgery consulted and evaluated patient.  MRI of the foot showed large soft tissue ulceration of the heel extending close to the bone with underlying early osteomyelitis, no abscess.  Discussed with Dr. Sharol Given at this morning, he plans to amputate on Wednesday 3/6.  He is afebrile, has no leukocytosis, no surrounding cellulitis but given presence of early osteomyelitis, will keep on suppressive Abx with Augmentin until after his surgery.  Active problems Acute hypoxic and hypercarbic respiratory failure -initially requiring intubation, eventually extubated and now he is stable on room air.  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   Chronic EtOH abuse with concern for withdrawal, cocaine abuse - UDS positive for cocaine on 09/20/2022. Continue folic acid supplement, thiamine supplement, multivitamins, out of window for alcohol withdrawal.   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 - Continue home regimen   Right elbow swelling- Cellulitis vs thrombophlebitis  -no fracture, resolved    Scheduled Meds:  docusate  100 mg Oral BID   feeding supplement (NEPRO CARB STEADY)  237 mL Oral Q24H   fluticasone furoate-vilanterol  1 puff Inhalation Daily   folic acid  1 mg Oral Daily   heparin  5,000 Units Subcutaneous Q8H   magnesium oxide  400 mg Oral Daily   multivitamin with minerals  1 tablet Oral Daily   nutrition supplement (JUVEN)  1 packet Oral BID BM   pantoprazole  40 mg Oral Daily   polyethylene glycol  17 g Oral Daily   sodium hypochlorite   Irrigation BID   thiamine  100 mg Oral Daily   Continuous Infusions: PRN Meds:.acetaminophen, albuterol, benzocaine, HYDROcodone-acetaminophen, mouth rinse, polyethylene glycol  Current Outpatient Medications  Medication Instructions   albuterol (VENTOLIN HFA) 108 (90 Base) MCG/ACT inhaler 2 puffs, Inhalation, Every 6 hours PRN   amLODipine (NORVASC) 10 mg, Oral, Daily   fluticasone furoate-vilanterol (BREO ELLIPTA) 100-25 MCG/ACT AEPB 1 puff, Inhalation, Daily   folic acid (FOLVITE) 1 mg, Oral, Daily   guaiFENesin-dextromethorphan (ROBITUSSIN DM) 100-10 MG/5ML syrup 10 mLs, Oral, Every 4 hours PRN   lisinopril (ZESTRIL) 10 mg, Oral, Daily   Multiple Vitamin (MULTIVITAMIN WITH MINERALS) TABS tablet  1 tablet, Oral, Daily   pantoprazole (PROTONIX) 40 mg, Oral, Daily   thiamine (VITAMIN B1) 100 mg, Oral, Daily    Diet Orders (From admission, onward)     Start     Ordered   10/23/22 1420  DIET DYS 3 Room service  appropriate? No; Fluid consistency: Thin  Diet effective now       Question Answer Comment  Room service appropriate? No   Fluid consistency: Thin      10/23/22 1419            DVT prophylaxis: heparin injection 5,000 Units Start: 10/15/22 0930   Lab Results  Component Value Date   PLT 403 (H) 11/14/2022      Code Status: Full Code  Family Communication: no family at bedside  Status is: Inpatient  Remains inpatient appropriate because: awaiting placement and possible need to go back to the OR  Level of care: Med-Surg  Consultants:  PCCM  Objective: Vitals:   11/14/22 0829 11/14/22 1454 11/14/22 2007 11/15/22 0401  BP:  137/81 (!) 160/79 132/82  Pulse:  (!) 104 (!) 104 (!) 105  Resp:  '18 18 18  '$ Temp:  98.4 F (36.9 C) 98.2 F (36.8 C)   TempSrc:  Oral Oral   SpO2: 95% 98% 99% 98%  Weight:      Height:        Intake/Output Summary (Last 24 hours) at 11/15/2022 0919 Last data filed at 11/14/2022 [id number] Gross per 24 hour  Intake --  Output 400 ml  Net -400 ml    Wt Readings from Last 3 Encounters:  11/12/22 68.9 kg  09/23/22 58.8 kg  09/17/22 56.8 kg    Examination:  Constitutional: NAD Eyes: lids and conjunctivae normal, no scleral icterus ENMT: mmm Neck: normal, supple Respiratory: clear to auscultation bilaterally, no wheezing, no crackles. Normal respiratory effort.  Cardiovascular: Regular rate and rhythm, no murmurs / rubs / gallops. No LE edema. Abdomen: soft, no distention, no tenderness. Bowel sounds positive.  Skin: no rashes  Data Reviewed: I have independently reviewed following labs and imaging studies   CBC Recent Labs  Lab 11/14/22 0805  WBC 7.9  HGB 11.0*  HCT 33.8*  PLT 403*  MCV 95.8  MCH 31.2  MCHC 32.5  RDW 15.6*     Recent Labs  Lab 11/14/22 0805  NA 134*  K 4.0  CL 94*  CO2 29  GLUCOSE 110*  BUN 13  CREATININE 0.50*  CALCIUM 9.7  AST 16  ALT 11  ALKPHOS 53  BILITOT 0.3  ALBUMIN 3.8  MG 1.7      ------------------------------------------------------------------------------------------------------------------ No results for input(s): "CHOL", "HDL", "LDLCALC", "TRIG", "CHOLHDL", "LDLDIRECT" in the last 72 hours.  Lab Results  Component Value Date   HGBA1C 5.6 09/02/2022   ------------------------------------------------------------------------------------------------------------------ No results for input(s): "TSH", "T4TOTAL", "T3FREE", "THYROIDAB" in the last 72 hours.  Invalid input(s): "FREET3"  Cardiac Enzymes No results for input(s): "CKMB", "TROPONINI", "MYOGLOBIN" in the last 168 hours.  Invalid input(s): "CK" ------------------------------------------------------------------------------------------------------------------    Component Value Date/Time   BNP 71.0 10/15/2022 0636    CBG: No results for input(s): "GLUCAP" in the last 168 hours.   No results found for this or any previous visit (from the past 240 hour(s)).   Radiology Studies: No results found.  Ronald [name] M. Cruzita Lederer, MD, PhD [facility]  Between 7 am - 7 pm you can contact me via Amion (for emergencies) or [place] (non urgent matters).  I am not available  7 pm - 7 am, please contact night coverage MD/APP via Amion

## 2022-11-15 NOTE — Progress Notes (Signed)
Patient ID: Ronald [name], male   DOB: 1956/03/29, 67 y.o.   MRN: [id number] Patient is seen in follow-up for osteomyelitis right calcaneus.  Patient has an existing left transtibial amputation.  Patient states he would like to proceed with a below-knee amputation on the right on Wednesday.  Hemoglobin is stable.

## 2022-11-16 DIAGNOSIS — M86171 Other acute osteomyelitis, right ankle and foot: Secondary | ICD-10-CM | POA: Diagnosis not present

## 2022-11-16 DIAGNOSIS — J9601 Acute respiratory failure with hypoxia: Secondary | ICD-10-CM | POA: Diagnosis not present

## 2022-11-16 DIAGNOSIS — J1569 Pneumonia due to other gram-negative bacteria: Secondary | ICD-10-CM | POA: Diagnosis not present

## 2022-11-16 LAB — BASIC METABOLIC PANEL
Anion gap: 10 (ref 5–15)
BUN: 14 mg/dL (ref 8–23)
CO2: 26 mmol/L (ref 22–32)
Calcium: 9.5 mg/dL (ref 8.9–10.3)
Chloride: 96 mmol/L — ABNORMAL LOW (ref 98–111)
Creatinine, Ser: 0.52 mg/dL — ABNORMAL LOW (ref 0.61–1.24)
GFR, Estimated: >60 mL/min (ref >60)
Glucose, Bld: 112 mg/dL — ABNORMAL HIGH (ref 70–99)
Potassium: 4 mmol/L (ref 3.5–5.1)
Sodium: 132 mmol/L — ABNORMAL LOW (ref 135–145)

## 2022-11-16 LAB — CBC
HCT: 31 % — ABNORMAL LOW (ref 39.0–52.0)
Hemoglobin: 10.5 g/dL — ABNORMAL LOW (ref 13.0–17.0)
MCH: 31.9 pg (ref 26.0–34.0)
MCHC: 33.9 g/dL (ref 30.0–36.0)
MCV: 94.2 fL (ref 80.0–100.0)
Platelets: 320 10*3/uL (ref 150–400)
RBC: 3.29 MIL/uL — ABNORMAL LOW (ref 4.22–5.81)
RDW: 15.4 % (ref 11.5–15.5)
WBC: 8.5 10*3/uL (ref 4.0–10.5)
nRBC: 0 % (ref 0.0–0.2)

## 2022-11-16 LAB — MAGNESIUM: Magnesium: 1.7 mg/dL (ref 1.7–2.4)

## 2022-11-16 MED ORDER — TRANEXAMIC ACID 1000 MG/10ML IV SOLN
2000.0000 mg | INTRAVENOUS | Status: DC
  Filled 2022-11-16: qty 20

## 2022-11-16 NOTE — Progress Notes (Signed)
PROGRESS NOTE  [name] DOB: [birth year] DOA: 10/15/2022 PCP: Demetrios Isaacs, FNP   LOS: 32 days   Brief Narrative / Interim history: 67 year old male with with prior polysubstance abuse, EtOH, cocaine, noncompliance, right heel unstageable pressure injury, PVD who comes into the hospital with altered mental status.  He was recently admitted and discharged home as he refused SNF, and came back to the hospital confused, hypoxic, hypercarbic and noted to be aspirating.  He was initially required to the ICU requiring intubation.  Eventually extubated on 2/3 and transferred to [facility] on 2/5.  Currently stable, awaiting SNF  Subjective / 24h Interval events: No complaints today.  No overnight events  Assesement and Plan: Principal Problem:   Respiratory failure ([place]) Active Problems:   Alcohol abuse   Acute osteomyelitis of right calcaneus (HCC)   PVD (peripheral vascular disease) (HCC)  Principal problem Unstageable right heel pressure injury, early osteomyelitis (POA) -orthopedic surgery consulted and evaluated patient.  MRI of the foot showed large soft tissue ulceration of the heel extending close to the bone with underlying early osteomyelitis, no abscess.  Discussed with Dr. Sharol Given, he plans to amputate tomorrow on 3/6.  He is afebrile, has no leukocytosis, no surrounding cellulitis but given presence of early osteomyelitis, will keep on suppressive Abx with Augmentin until after his surgery.  Active problems Acute hypoxic and hypercarbic respiratory failure -initially requiring intubation, eventually extubated and now he is stable on room air.  Proteus mirabilis pneumonia -finished a course of antibiotics  Acute on Chronic Hyponatremia in the setting of beer potomania - On admission serum sodium 112, currently in the upper 120s to low 130s which  is his baseline   Dysphagia  -tolerating a dysphagia 3 diet   Hypokalemia, hypomagnesemia -monitor and replete as  indicated  Right jaw pain -CT scan with out abscess  Prediabetes, blood sugars are well-controlled - A1c 5.6 on 09/02/2022.   Chronic EtOH abuse with concern for withdrawal, cocaine abuse - UDS positive for cocaine on 09/20/2022. Continue folic acid supplement, thiamine supplement, multivitamins, out of window for alcohol withdrawal.   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 - Continue home regimen   Right elbow swelling- Cellulitis vs thrombophlebitis  -no fracture, resolved    Scheduled Meds:  amoxicillin-clavulanate  1 tablet Oral Q12H   docusate  100 mg Oral BID   feeding supplement (NEPRO CARB STEADY)  237 mL Oral Q24H   fluticasone furoate-vilanterol  1 puff Inhalation Daily   folic acid  1 mg Oral Daily   heparin  5,000 Units Subcutaneous Q8H   magnesium oxide  400 mg Oral Daily   multivitamin with minerals  1 tablet Oral Daily   nutrition supplement (JUVEN)  1 packet Oral BID BM   pantoprazole  40 mg Oral Daily   polyethylene glycol  17 g Oral Daily   sodium hypochlorite   Irrigation BID   thiamine  100 mg Oral Daily   Continuous Infusions: PRN Meds:.acetaminophen, albuterol, benzocaine, HYDROcodone-acetaminophen, mouth rinse, polyethylene glycol  Current Outpatient Medications  Medication Instructions   albuterol (VENTOLIN HFA) 108 (90 Base) MCG/ACT inhaler 2 puffs, Inhalation, Every 6 hours PRN   amLODipine (NORVASC) 10 mg, Oral, Daily   fluticasone furoate-vilanterol (BREO ELLIPTA) 100-25 MCG/ACT AEPB 1 puff, Inhalation, Daily   folic acid (FOLVITE) 1 mg, Oral, Daily   guaiFENesin-dextromethorphan (ROBITUSSIN DM) 100-10 MG/5ML syrup 10 mLs, Oral, Every 4 hours PRN   lisinopril (ZESTRIL) 10 mg, Oral, Daily   Multiple Vitamin (  MULTIVITAMIN WITH MINERALS) TABS tablet 1 tablet, Oral, Daily   pantoprazole (PROTONIX) 40 mg, Oral, Daily   thiamine (VITAMIN B1) 100 mg, Oral, Daily    Diet Orders (From admission, onward)     Start     Ordered   11/17/22 0000   Diet NPO time specified  Diet effective ____        11/15/22 1728   10/23/22 1420  DIET DYS 3 Room service appropriate? No; Fluid consistency: Thin  Diet effective now       Question Answer Comment  Room service appropriate? No   Fluid consistency: Thin      10/23/22 1419            DVT prophylaxis: heparin injection 5,000 Units Start: 10/15/22 0930   Lab Results  Component Value Date   PLT 320 11/16/2022      Code Status: Full Code  Family Communication: no family at bedside  Status is: Inpatient  Remains inpatient appropriate because: awaiting placement and possible need to go back to the OR  Level of care: Med-Surg  Consultants:  PCCM  Objective: Vitals:   11/14/22 2007 11/15/22 0401 11/15/22 1500 11/15/22 1946  BP: (!) 160/79 132/82 (!) 145/86 (!) 144/83  Pulse: (!) 104 (!) 105 95 (!) 106  Resp: '18 18 17   '$ Temp: 98.2 F (36.8 C)  97.8 F (36.6 C) 98.3 F (36.8 C)  TempSrc: Oral  Oral Oral  SpO2: 99% 98% 97% 100%  Weight:      Height:        Intake/Output Summary (Last 24 hours) at 11/16/2022 0829 Last data filed at 11/16/2022 0328 Gross per 24 hour  Intake 1560 ml  Output 2550 ml  Net -990 ml    Wt Readings from Last 3 Encounters:  11/12/22 68.9 kg  09/23/22 58.8 kg  09/17/22 56.8 kg    Examination:  Constitutional: NAD Respiratory: CTA Cardiovascular: RRR  Data Reviewed: I have independently reviewed following labs and imaging studies   CBC Recent Labs  Lab 11/14/22 0805 11/16/22 0400  WBC 7.9 8.5  HGB 11.0* 10.5*  HCT 33.8* 31.0*  PLT 403* 320  MCV 95.8 94.2  MCH 31.2 31.9  MCHC 32.5 33.9  RDW 15.6* 15.4     Recent Labs  Lab 11/14/22 0805 11/16/22 0400  NA 134* 132*  K 4.0 4.0  CL 94* 96*  CO2 29 26  GLUCOSE 110* 112*  BUN 13 14  CREATININE 0.50* 0.52*  CALCIUM 9.7 9.5  AST 16  --   ALT 11  --   ALKPHOS 53  --   BILITOT 0.3  --   ALBUMIN 3.8  --   MG 1.7 1.7      ------------------------------------------------------------------------------------------------------------------ No results for input(s): "CHOL", "HDL", "LDLCALC", "TRIG", "CHOLHDL", "LDLDIRECT" in the last 72 hours.  Lab Results  Component Value Date   HGBA1C 5.6 09/02/2022   ------------------------------------------------------------------------------------------------------------------ No results for input(s): "TSH", "T4TOTAL", "T3FREE", "THYROIDAB" in the last 72 hours.  Invalid input(s): "FREET3"  Cardiac Enzymes No results for input(s): "CKMB", "TROPONINI", "MYOGLOBIN" in the last 168 hours.  Invalid input(s): "CK" ------------------------------------------------------------------------------------------------------------------    Component Value Date/Time   BNP 71.0 10/15/2022 0636    CBG: No results for input(s): "GLUCAP" in the last 168 hours.   No results found for this or any previous visit (from the past 240 hour(s)).   Radiology Studies: No results found.  Ronald [name] M. Cruzita Lederer, MD, PhD [facility]  Between 7 am - 7  pm you can contact me via Amion (for emergencies) or [place] (non urgent matters).  I am not available 7 pm - 7 am, please contact night coverage MD/APP via Amion

## 2022-11-17 ENCOUNTER — Other Ambulatory Visit: Payer: Self-pay

## 2022-11-17 ENCOUNTER — Encounter (HOSPITAL_COMMUNITY): Payer: Self-pay | Admitting: Internal Medicine

## 2022-11-17 ENCOUNTER — Encounter (HOSPITAL_COMMUNITY)
Admission: EM | Disposition: A | Discharge status: Skilled Nursing Facility | Payer: Self-pay | Source: Home / Self Care | Attending: Internal Medicine

## 2022-11-17 ENCOUNTER — Inpatient Hospital Stay (HOSPITAL_COMMUNITY): Payer: Medicare Other | Admitting: Anesthesiology

## 2022-11-17 DIAGNOSIS — J9601 Acute respiratory failure with hypoxia: Secondary | ICD-10-CM | POA: Diagnosis not present

## 2022-11-17 DIAGNOSIS — F17210 Nicotine dependence, cigarettes, uncomplicated: Secondary | ICD-10-CM | POA: Diagnosis not present

## 2022-11-17 DIAGNOSIS — J449 Chronic obstructive pulmonary disease, unspecified: Secondary | ICD-10-CM | POA: Diagnosis not present

## 2022-11-17 DIAGNOSIS — M86271 Subacute osteomyelitis, right ankle and foot: Secondary | ICD-10-CM

## 2022-11-17 DIAGNOSIS — F1010 Alcohol abuse, uncomplicated: Secondary | ICD-10-CM | POA: Diagnosis not present

## 2022-11-17 DIAGNOSIS — M869 Osteomyelitis, unspecified: Secondary | ICD-10-CM | POA: Diagnosis not present

## 2022-11-17 DIAGNOSIS — E871 Hypo-osmolality and hyponatremia: Secondary | ICD-10-CM | POA: Diagnosis not present

## 2022-11-17 DIAGNOSIS — I10 Essential (primary) hypertension: Secondary | ICD-10-CM

## 2022-11-17 DIAGNOSIS — J441 Chronic obstructive pulmonary disease with (acute) exacerbation: Secondary | ICD-10-CM | POA: Diagnosis not present

## 2022-11-17 DIAGNOSIS — J1569 Pneumonia due to other gram-negative bacteria: Secondary | ICD-10-CM | POA: Diagnosis not present

## 2022-11-17 HISTORY — PX: AMPUTATION: SHX166

## 2022-11-17 LAB — SURGICAL PCR SCREEN
MRSA, PCR: NEGATIVE
Staphylococcus aureus: NEGATIVE

## 2022-11-17 SURGERY — AMPUTATION BELOW KNEE
Anesthesia: Regional | Site: Knee | Laterality: Right

## 2022-11-17 MED ORDER — MAGNESIUM CITRATE PO SOLN: 1.0000 | Freq: Once | ORAL | Status: DC | PRN

## 2022-11-17 MED ORDER — LACTATED RINGERS IV SOLN: INTRAVENOUS | Status: DC

## 2022-11-17 MED ORDER — FENTANYL CITRATE (PF) 100 MCG/2ML IJ SOLN: 50.0000 ug | Freq: Once | INTRAMUSCULAR | Status: DC

## 2022-11-17 MED ORDER — MIDAZOLAM HCL 2 MG/2ML IJ SOLN: 2.0000 mg | Freq: Once | INTRAMUSCULAR | Status: AC

## 2022-11-17 MED ORDER — MIDAZOLAM HCL 2 MG/2ML IJ SOLN
INTRAMUSCULAR | Status: AC
  Administered 2022-11-17: 2 mg via INTRAVENOUS
  Filled 2022-11-17: qty 2

## 2022-11-17 MED ORDER — POLYETHYLENE GLYCOL 3350 17 G PO PACK: 17.0000 g | PACK | Freq: Every day | ORAL | Status: DC | PRN

## 2022-11-17 MED ORDER — ONDANSETRON HCL 4 MG/2ML IJ SOLN
INTRAMUSCULAR | Status: AC
  Filled 2022-11-17: qty 2

## 2022-11-17 MED ORDER — ONDANSETRON HCL 4 MG/2ML IJ SOLN: 4.0000 mg | Freq: Four times a day (QID) | INTRAMUSCULAR | Status: DC | PRN

## 2022-11-17 MED ORDER — PANTOPRAZOLE SODIUM 40 MG PO TBEC: 40.0000 mg | DELAYED_RELEASE_TABLET | Freq: Every day | ORAL | Status: DC

## 2022-11-17 MED ORDER — OXYCODONE HCL 5 MG PO TABS
10.0000 mg | ORAL_TABLET | ORAL | Status: DC | PRN
  Administered 2022-11-17: 15 mg via ORAL
  Administered 2022-11-18: 10 mg via ORAL
  Administered 2022-11-18 – 2022-11-23 (×15): 15 mg via ORAL
  Administered 2022-11-24: 10 mg via ORAL
  Administered 2022-11-24: 15 mg via ORAL
  Administered 2022-11-25: 10 mg via ORAL
  Administered 2022-11-26 – 2022-12-03 (×24): 15 mg via ORAL
  Filled 2022-11-17 (×6): qty 3
  Filled 2022-11-17: qty 2
  Filled 2022-11-17 (×14): qty 3
  Filled 2022-11-17: qty 2
  Filled 2022-11-17 (×22): qty 3

## 2022-11-17 MED ORDER — ALUM & MAG HYDROXIDE-SIMETH 200-200-20 MG/5ML PO SUSP: 15.0000 mL | ORAL | Status: DC | PRN

## 2022-11-17 MED ORDER — POTASSIUM CHLORIDE CRYS ER 20 MEQ PO TBCR: 20.0000 meq | EXTENDED_RELEASE_TABLET | Freq: Every day | ORAL | Status: DC | PRN

## 2022-11-17 MED ORDER — ZINC SULFATE 220 (50 ZN) MG PO CAPS
220.0000 mg | ORAL_CAPSULE | Freq: Every day | ORAL | Status: AC
  Administered 2022-11-17 – 2022-11-30 (×14): 220 mg via ORAL
  Filled 2022-11-17 (×14): qty 1

## 2022-11-17 MED ORDER — ONDANSETRON HCL 4 MG/2ML IJ SOLN
INTRAMUSCULAR | Status: DC | PRN
  Administered 2022-11-17: 4 mg via INTRAVENOUS

## 2022-11-17 MED ORDER — ORAL CARE MOUTH RINSE: 15.0000 mL | Freq: Once | OROMUCOSAL | Status: DC

## 2022-11-17 MED ORDER — OXYCODONE HCL 5 MG PO TABS
ORAL_TABLET | ORAL | Status: AC
  Filled 2022-11-17: qty 1

## 2022-11-17 MED ORDER — CEFAZOLIN SODIUM-DEXTROSE 2-4 GM/100ML-% IV SOLN
2.0000 g | INTRAVENOUS | Status: AC
  Administered 2022-11-17: 2 g via INTRAVENOUS
  Filled 2022-11-17: qty 100

## 2022-11-17 MED ORDER — CHLORHEXIDINE GLUCONATE 0.12 % MT SOLN
OROMUCOSAL | Status: AC
  Administered 2022-11-17: 15 mL
  Filled 2022-11-17: qty 15

## 2022-11-17 MED ORDER — TRANEXAMIC ACID-NACL 1000-0.7 MG/100ML-% IV SOLN
1000.0000 mg | INTRAVENOUS | Status: DC
  Filled 2022-11-17: qty 100

## 2022-11-17 MED ORDER — HYDRALAZINE HCL 20 MG/ML IJ SOLN: 5.0000 mg | INTRAMUSCULAR | Status: DC | PRN

## 2022-11-17 MED ORDER — OXYCODONE HCL 5 MG PO TABS
5.0000 mg | ORAL_TABLET | ORAL | Status: DC | PRN
  Administered 2022-11-23 – 2022-11-25 (×4): 10 mg via ORAL
  Filled 2022-11-17 (×5): qty 2

## 2022-11-17 MED ORDER — ACETAMINOPHEN 325 MG PO TABS
325.0000 mg | ORAL_TABLET | Freq: Four times a day (QID) | ORAL | Status: DC | PRN
  Administered 2022-11-24 – 2022-12-03 (×2): 650 mg via ORAL
  Filled 2022-11-17 (×2): qty 2

## 2022-11-17 MED ORDER — CEFAZOLIN SODIUM-DEXTROSE 2-4 GM/100ML-% IV SOLN
2.0000 g | Freq: Three times a day (TID) | INTRAVENOUS | Status: AC
  Administered 2022-11-17 – 2022-11-18 (×2): 2 g via INTRAVENOUS
  Filled 2022-11-17 (×2): qty 100

## 2022-11-17 MED ORDER — CHLORHEXIDINE GLUCONATE 4 % EX LIQD
60.0000 mL | Freq: Once | CUTANEOUS | Status: AC
  Administered 2022-11-17: 4 via TOPICAL
  Filled 2022-11-17: qty 60

## 2022-11-17 MED ORDER — METOPROLOL TARTRATE 5 MG/5ML IV SOLN
2.0000 mg | INTRAVENOUS | Status: DC | PRN
  Filled 2022-11-17: qty 5

## 2022-11-17 MED ORDER — FENTANYL CITRATE (PF) 100 MCG/2ML IJ SOLN
INTRAMUSCULAR | Status: AC
  Administered 2022-11-17: 50 ug
  Filled 2022-11-17: qty 2

## 2022-11-17 MED ORDER — JUVEN PO PACK: 1.0000 | PACK | Freq: Two times a day (BID) | ORAL | Status: DC

## 2022-11-17 MED ORDER — HYDROMORPHONE HCL 1 MG/ML IJ SOLN
0.5000 mg | INTRAMUSCULAR | Status: DC | PRN
  Administered 2022-11-18 – 2022-11-19 (×4): 1 mg via INTRAVENOUS
  Filled 2022-11-17 (×5): qty 1

## 2022-11-17 MED ORDER — LIDOCAINE 2% (20 MG/ML) 5 ML SYRINGE
INTRAMUSCULAR | Status: DC | PRN
  Administered 2022-11-17: 100 mg via INTRAVENOUS

## 2022-11-17 MED ORDER — PROPOFOL 10 MG/ML IV BOLUS
INTRAVENOUS | Status: DC | PRN
  Administered 2022-11-17: 200 mg via INTRAVENOUS

## 2022-11-17 MED ORDER — AMISULPRIDE (ANTIEMETIC) 5 MG/2ML IV SOLN: 10.0000 mg | Freq: Once | INTRAVENOUS | Status: DC | PRN

## 2022-11-17 MED ORDER — BUPIVACAINE HCL (PF) 0.5 % IJ SOLN: INTRAMUSCULAR | Status: DC | PRN

## 2022-11-17 MED ORDER — 0.9 % SODIUM CHLORIDE (POUR BTL) OPTIME
TOPICAL | Status: DC | PRN
  Administered 2022-11-17: 1000 mL

## 2022-11-17 MED ORDER — POVIDONE-IODINE 10 % EX SWAB
2.0000 | Freq: Once | CUTANEOUS | Status: AC
  Administered 2022-11-17: 2 via TOPICAL

## 2022-11-17 MED ORDER — OXYCODONE HCL 5 MG PO TABS
5.0000 mg | ORAL_TABLET | Freq: Once | ORAL | Status: AC | PRN
  Administered 2022-11-17: 5 mg via ORAL

## 2022-11-17 MED ORDER — LABETALOL HCL 5 MG/ML IV SOLN: 10.0000 mg | INTRAVENOUS | Status: DC | PRN

## 2022-11-17 MED ORDER — FENTANYL CITRATE (PF) 100 MCG/2ML IJ SOLN
25.0000 ug | INTRAMUSCULAR | Status: DC | PRN
  Administered 2022-11-17 (×3): 50 ug via INTRAVENOUS

## 2022-11-17 MED ORDER — DOCUSATE SODIUM 100 MG PO CAPS
100.0000 mg | ORAL_CAPSULE | Freq: Every day | ORAL | Status: DC
  Administered 2022-11-18 – 2022-11-30 (×13): 100 mg via ORAL
  Filled 2022-11-17 (×15): qty 1

## 2022-11-17 MED ORDER — PHENOL 1.4 % MT LIQD: 1.0000 | OROMUCOSAL | Status: DC | PRN

## 2022-11-17 MED ORDER — OXYCODONE HCL 5 MG/5ML PO SOLN: 5.0000 mg | Freq: Once | ORAL | Status: AC | PRN

## 2022-11-17 MED ORDER — PROPOFOL 10 MG/ML IV BOLUS
INTRAVENOUS | Status: AC
  Filled 2022-11-17: qty 20

## 2022-11-17 MED ORDER — VITAMIN C 500 MG PO TABS
1000.0000 mg | ORAL_TABLET | Freq: Every day | ORAL | Status: DC
  Administered 2022-11-17 – 2022-12-03 (×17): 1000 mg via ORAL
  Filled 2022-11-17 (×17): qty 2

## 2022-11-17 MED ORDER — MAGNESIUM SULFATE 2 GM/50ML IV SOLN: 2.0000 g | Freq: Every day | INTRAVENOUS | Status: DC | PRN

## 2022-11-17 MED ORDER — BUPIVACAINE HCL (PF) 0.5 % IJ SOLN
INTRAMUSCULAR | Status: DC | PRN
  Administered 2022-11-17: 10 mL via PERINEURAL
  Administered 2022-11-17: 15 mL via PERINEURAL

## 2022-11-17 MED ORDER — CHLORHEXIDINE GLUCONATE 0.12 % MT SOLN: 15.0000 mL | Freq: Once | OROMUCOSAL | Status: DC

## 2022-11-17 MED ORDER — FENTANYL CITRATE (PF) 100 MCG/2ML IJ SOLN
INTRAMUSCULAR | Status: AC
  Filled 2022-11-17: qty 2

## 2022-11-17 MED ORDER — BISACODYL 5 MG PO TBEC: 5.0000 mg | DELAYED_RELEASE_TABLET | Freq: Every day | ORAL | Status: DC | PRN

## 2022-11-17 MED ORDER — GUAIFENESIN-DM 100-10 MG/5ML PO SYRP: 15.0000 mL | ORAL_SOLUTION | ORAL | Status: DC | PRN

## 2022-11-17 MED ORDER — SODIUM CHLORIDE 0.9 % IV SOLN: INTRAVENOUS | Status: DC

## 2022-11-17 SURGICAL SUPPLY — 42 items
BAG COUNTER SPONGE SURGICOUNT (BAG) IMPLANT
BAG SPNG CNTER NS LX DISP (BAG)
BIT DRILL 3.2XOCPTL (BIT) ×1 IMPLANT
BIT DRL 3.2XOCPTL (BIT) ×1
BLADE SAW RECIP 87.9 MT (BLADE) ×1 IMPLANT
BLADE SURG 21 STRL SS (BLADE) ×1 IMPLANT
BNDG CMPR 5X6 CHSV STRCH STRL (GAUZE/BANDAGES/DRESSINGS)
BNDG COHESIVE 6X5 TAN ST LF (GAUZE/BANDAGES/DRESSINGS) IMPLANT
CANISTER WOUND CARE 500ML ATS (WOUND CARE) ×1 IMPLANT
COVER SURGICAL LIGHT HANDLE (MISCELLANEOUS) ×1 IMPLANT
CUFF TOURN SGL QUICK 34 (TOURNIQUET CUFF) ×1
CUFF TRNQT CYL 34X4.125X (TOURNIQUET CUFF) ×1 IMPLANT
DRAPE INCISE IOBAN 66X45 STRL (DRAPES) ×1 IMPLANT
DRAPE U-SHAPE 47X51 STRL (DRAPES) ×1 IMPLANT
DRESSING PREVENA PLUS CUSTOM (GAUZE/BANDAGES/DRESSINGS) ×1 IMPLANT
DRILL BIT (BIT) ×1
DRSG PREVENA PLUS CUSTOM (GAUZE/BANDAGES/DRESSINGS) ×1
DURAPREP 26ML APPLICATOR (WOUND CARE) ×1 IMPLANT
ELECT REM PT RETURN 9FT ADLT (ELECTROSURGICAL) ×1
ELECTRODE REM PT RTRN 9FT ADLT (ELECTROSURGICAL) ×1 IMPLANT
GLOVE BIOGEL PI IND STRL 9 (GLOVE) ×1 IMPLANT
GLOVE SURG ORTHO 9.0 STRL STRW (GLOVE) ×1 IMPLANT
GOWN STRL REUS W/ TWL XL LVL3 (GOWN DISPOSABLE) ×2 IMPLANT
GOWN STRL REUS W/TWL XL LVL3 (GOWN DISPOSABLE) ×2
GRAFT SKIN WND MICRO 38 (Tissue) IMPLANT
KIT BASIN OR (CUSTOM PROCEDURE TRAY) ×1 IMPLANT
KIT TURNOVER KIT B (KITS) ×1 IMPLANT
MANIFOLD NEPTUNE II (INSTRUMENTS) ×1 IMPLANT
NS IRRIG 1000ML POUR BTL (IV SOLUTION) ×1 IMPLANT
PACK ORTHO EXTREMITY (CUSTOM PROCEDURE TRAY) ×1 IMPLANT
PAD ARMBOARD 7.5X6 YLW CONV (MISCELLANEOUS) ×1 IMPLANT
PREVENA RESTOR ARTHOFORM 46X30 (CANNISTER) ×1 IMPLANT
SPONGE T-LAP 18X18 ~~LOC~~+RFID (SPONGE) IMPLANT
STAPLER VISISTAT 35W (STAPLE) IMPLANT
STOCKINETTE IMPERVIOUS LG (DRAPES) ×1 IMPLANT
SUT ETHILON 2 0 PSLX (SUTURE) IMPLANT
SUT SILK 2 0 (SUTURE) ×1
SUT SILK 2-0 18XBRD TIE 12 (SUTURE) ×1 IMPLANT
SUT VIC AB 1 CTX 27 (SUTURE) ×2 IMPLANT
TOWEL GREEN STERILE (TOWEL DISPOSABLE) ×1 IMPLANT
TUBE CONNECTING 12X1/4 (SUCTIONS) ×1 IMPLANT
YANKAUER SUCT BULB TIP NO VENT (SUCTIONS) ×1 IMPLANT

## 2022-11-17 NOTE — Interval H&P Note (Signed)
History and Physical Interval Note:  11/17/2022 9:15 AM  Ronald [name]  has presented today for surgery, with the diagnosis of Osteomyelitis Right Foot.  The various methods of treatment have been discussed with the patient and family. After consideration of risks, benefits and other options for treatment, the patient has consented to  Procedure(s): RIGHT BELOW KNEE AMPUTATION (Right) as a surgical intervention.  The patient's history has been reviewed, patient examined, no change in status, stable for surgery.  I have reviewed the patient's chart and labs.  Questions were answered to the patient's satisfaction.     Newt Minion

## 2022-11-17 NOTE — Anesthesia Preprocedure Evaluation (Addendum)
Anesthesia Evaluation  Patient identified by MRN, date of birth, ID band Patient awake    Reviewed: Allergy & Precautions, NPO status , Patient's Chart, lab work & pertinent test results  History of Anesthesia Complications Negative for: history of anesthetic complications  Airway Mallampati: III  TM Distance: >3 FB Neck ROM: Full    Dental  (+) Poor Dentition, Dental Advisory Given   Pulmonary neg shortness of breath, neg sleep apnea, COPD,  COPD inhaler, neg recent URI, Current Smoker and Patient abstained from smoking.   breath sounds clear to auscultation       Cardiovascular hypertension (amlodipine, lisinopril), Pt. on medications (-) angina + Peripheral Vascular Disease  + dysrhythmias (RBBB)  Rhythm:Regular Rate:Normal  TTE 09/03/2022: IMPRESSIONS     1. Left ventricular ejection fraction, by estimation, is 65 to 70%. The  left ventricle has normal function. The left ventricle has no regional  wall motion abnormalities. There is mild left ventricular hypertrophy.  Left ventricular diastolic parameters  are consistent with Grade I diastolic dysfunction (impaired relaxation).   2. Right ventricular systolic function is normal. The right ventricular  size is normal. There is normal pulmonary artery systolic pressure.   3. Trivial mitral valve regurgitation.   4. Aortic valve regurgitation is not visualized.   5. Aneurysm of the ascending aorta, measuring 41 mm.   6. The inferior vena cava is dilated in size with <50% respiratory  variability, suggesting right atrial pressure of 15 mmHg.     Neuro/Psych negative neurological ROS     GI/Hepatic ,GERD  Medicated,,(+)     substance abuse  alcohol use, cocaine use and marijuana use  Endo/Other  negative endocrine ROS    Renal/GU      Musculoskeletal osteomyelitis   Abdominal   Peds  Hematology negative hematology ROS (+)   Anesthesia Other  Findings 67 year old male with with prior polysubstance abuse, EtOH, cocaine, noncompliance, right heel unstageable pressure injury, PVD who comes into the hospital with altered mental status.  He was recently admitted and discharged home as he refused SNF, and came back to the hospital confused, hypoxic, hypercarbic and noted to be aspirating.  He was initially required to the ICU requiring intubation.  Eventually extubated on 2/3 and transferred to [facility] on 2/5.   Reproductive/Obstetrics                             Anesthesia Physical Anesthesia Plan  ASA: 3  Anesthesia Plan: General and Regional   Post-op Pain Management: Regional block* and Tylenol PO (pre-op)*   Induction: Intravenous  PONV Risk Score and Plan: 1 and Ondansetron, Dexamethasone and Treatment may vary due to age or medical condition  Airway Management Planned: LMA  Additional Equipment:   Intra-op Plan:   Post-operative Plan: Extubation in OR  Informed Consent: I have reviewed the patients History and Physical, chart, labs and discussed the procedure including the risks, benefits and alternatives for the proposed anesthesia with the patient or authorized representative who has indicated his/her understanding and acceptance.     Dental advisory given  Plan Discussed with: CRNA and Anesthesiologist  Anesthesia Plan Comments: (Discussed potential risks of nerve blocks including, but not limited to, infection, bleeding, nerve damage, seizures, pneumothorax, respiratory depression, and potential failure of the block. Alternatives to nerve blocks discussed. All questions answered.  Risks of general anesthesia discussed including, but not limited to, sore throat, hoarse voice, chipped/damaged teeth, injury to vocal cords,  nausea and vomiting, allergic reactions, lung infection, heart attack, stroke, and death. All questions answered. )        Anesthesia Quick Evaluation

## 2022-11-17 NOTE — Anesthesia Procedure Notes (Signed)
Procedure Name: LMA Insertion Date/Time: 11/17/2022 2:32 PM  Performed by: Georgia Duff, CRNAPre-anesthesia Checklist: Patient identified, Emergency Drugs available, Suction available and Patient being monitored Patient Re-evaluated:Patient Re-evaluated prior to induction Oxygen Delivery Method: Circle System Utilized Preoxygenation: Pre-oxygenation with 100% oxygen Induction Type: IV induction Ventilation: Mask ventilation without difficulty LMA: LMA inserted LMA Size: 5.0 Number of attempts: 1 Airway Equipment and Method: Bite block Placement Confirmation: positive ETCO2 Tube secured with: Tape Dental Injury: Teeth and Oropharynx as per pre-operative assessment

## 2022-11-17 NOTE — Anesthesia Postprocedure Evaluation (Signed)
Anesthesia Post Note  Patient: [name]  Procedure(s) Performed: RIGHT BELOW KNEE AMPUTATION (Right: Knee)     Patient location during evaluation: PACU Anesthesia Type: Regional and General Level of consciousness: awake Pain management: pain level controlled Vital Signs Assessment: post-procedure vital signs reviewed and stable Respiratory status: spontaneous breathing, nonlabored ventilation and respiratory function stable Cardiovascular status: blood pressure returned to baseline and stable Postop Assessment: no apparent nausea or vomiting Anesthetic complications: no   No notable events documented.  Last Vitals:  Vitals:   11/17/22 1535 11/17/22 1924  BP: (!) 160/86 118/68  Pulse: 100 (!) 109  Resp: 19 20  Temp: 36.9 C 36.9 C  SpO2: 94% 96%    Last Pain:  Vitals:   11/17/22 1924  TempSrc: Oral  PainSc: 0-No pain                 Nilda Simmer

## 2022-11-17 NOTE — Anesthesia Procedure Notes (Addendum)
Anesthesia Regional Block: Adductor canal block   Pre-Anesthetic Checklist: , timeout performed,  Correct Patient, Correct Site, Correct Laterality,  Correct Procedure, Correct Position, site marked,  Risks and benefits discussed,  Surgical consent,  Pre-op evaluation,  At surgeon's request and post-op pain management  Laterality: Right  Prep: chloraprep       Needles:  Injection technique: Single-shot  Needle Type: Echogenic Stimulator Needle     Needle Length: 9cm  Needle Gauge: 21     Additional Needles:   Procedures:,,,, ultrasound used (permanent image in chart),,    Narrative:  Start time: 11/17/2022 1:43 PM End time: 11/17/2022 1:45 PM Injection made incrementally with aspirations every 5 mL.  Performed by: Personally  Anesthesiologist: Nilda Simmer, MD  Additional Notes: Discussed risks and benefits of nerve block including, but not limited to, prolonged and/or permanent nerve injury involving sensory and/or motor function. Monitors were applied and a time-out was performed. The nerve and associated structures were visualized under ultrasound guidance. After negative aspiration, local anesthetic was slowly injected around the nerve. There was no evidence of high pressure during the procedure. There were no paresthesias. VSS remained stable and the patient tolerated the procedure well.

## 2022-11-17 NOTE — Plan of Care (Signed)
  Problem: Education: Goal: Ability to describe self-care measures that may prevent or decrease complications (Diabetes Survival Skills Education) will improve Outcome: Progressing   Problem: Coping: Goal: Ability to adjust to condition or change in health will improve Outcome: Progressing   Problem: Fluid Volume: Goal: Ability to maintain a balanced intake and output will improve Outcome: Progressing   Problem: Health Behavior/Discharge Planning: Goal: Ability to identify and utilize available resources and services will improve Outcome: Progressing Goal: Ability to manage health-related needs will improve Outcome: Progressing   Problem: Metabolic: Goal: Ability to maintain appropriate glucose levels will improve Outcome: Progressing   Problem: Nutritional: Goal: Maintenance of adequate nutrition will improve Outcome: Progressing Goal: Progress toward achieving an optimal weight will improve Outcome: Progressing   Problem: Skin Integrity: Goal: Risk for impaired skin integrity will decrease Outcome: Progressing   Problem: Tissue Perfusion: Goal: Adequacy of tissue perfusion will improve Outcome: Progressing   Problem: Education: Goal: Knowledge of General Education information will improve Description: Including pain rating scale, medication(s)/side effects and non-pharmacologic comfort measures Outcome: Progressing   Problem: Health Behavior/Discharge Planning: Goal: Ability to manage health-related needs will improve Outcome: Progressing   Problem: Clinical Measurements: Goal: Ability to maintain clinical measurements within normal limits will improve Outcome: Progressing Goal: Will remain free from infection Outcome: Progressing Goal: Diagnostic test results will improve Outcome: Progressing Goal: Respiratory complications will improve Outcome: Progressing Goal: Cardiovascular complication will be avoided Outcome: Progressing   Problem: Activity: Goal:  Risk for activity intolerance will decrease Outcome: Progressing   Problem: Nutrition: Goal: Adequate nutrition will be maintained Outcome: Progressing   Problem: Coping: Goal: Level of anxiety will decrease Outcome: Progressing   Problem: Elimination: Goal: Will not experience complications related to bowel motility Outcome: Progressing Goal: Will not experience complications related to urinary retention Outcome: Progressing   Problem: Pain Managment: Goal: General experience of comfort will improve Outcome: Progressing   Problem: Safety: Goal: Ability to remain free from injury will improve Outcome: Progressing   Problem: Skin Integrity: Goal: Risk for impaired skin integrity will decrease Outcome: Progressing   Problem: Safety: Goal: Non-violent Restraint(s) Outcome: Progressing   Problem: Education: Goal: Knowledge of the prescribed therapeutic regimen will improve Outcome: Progressing Goal: Ability to verbalize activity precautions or restrictions will improve Outcome: Progressing Goal: Understanding of discharge needs will improve Outcome: Progressing   Problem: Activity: Goal: Ability to perform//tolerate increased activity and mobilize with assistive devices will improve Outcome: Progressing   Problem: Clinical Measurements: Goal: Postoperative complications will be avoided or minimized Outcome: Progressing   Problem: Self-Care: Goal: Ability to meet self-care needs will improve Outcome: Progressing   Problem: Self-Concept: Goal: Ability to maintain and perform role responsibilities to the fullest extent possible will improve Outcome: Progressing   Problem: Pain Management: Goal: Pain level will decrease with appropriate interventions Outcome: Progressing   Problem: Skin Integrity: Goal: Demonstration of wound healing without infection will improve Outcome: Progressing

## 2022-11-17 NOTE — Anesthesia Procedure Notes (Signed)
Anesthesia Regional Block: Popliteal block   Pre-Anesthetic Checklist: , timeout performed,  Correct Patient, Correct Site, Correct Laterality,  Correct Procedure, Correct Position, site marked,  Risks and benefits discussed,  Surgical consent,  Pre-op evaluation,  At surgeon's request and post-op pain management  Laterality: Right  Prep: chloraprep       Needles:  Injection technique: Single-shot  Needle Type: Echogenic Stimulator Needle     Needle Length: 9cm  Needle Gauge: 21     Additional Needles:   Procedures:,,,, ultrasound used (permanent image in chart),,    Narrative:  Start time: 11/17/2022 1:38 PM End time: 11/17/2022 1:41 PM Injection made incrementally with aspirations every 5 mL.  Performed by: Personally  Anesthesiologist: Nilda Simmer, MD  Additional Notes: Discussed risks and benefits of nerve block including, but not limited to, prolonged and/or permanent nerve injury involving sensory and/or motor function. Monitors were applied and a time-out was performed. The nerve and associated structures were visualized under ultrasound guidance. After negative aspiration, local anesthetic was slowly injected around the nerve. There was no evidence of high pressure during the procedure. There were no paresthesias. VSS remained stable and the patient tolerated the procedure well.

## 2022-11-17 NOTE — Op Note (Signed)
11/17/2022  3:02 PM  PATIENT:  [name]    PRE-OPERATIVE DIAGNOSIS:  Osteomyelitis Right Foot  POST-OPERATIVE DIAGNOSIS:  Same  PROCEDURE:  RIGHT BELOW KNEE AMPUTATION Application of Kerecis micro graft 38 cm   Application of Prevena customizable and Prevena arthroform wound VAC dressings Application of Vive Wear stump shrinker and the Hanger limb protector  SURGEON:  Newt Minion, MD  ANESTHESIA:   General  PREOPERATIVE INDICATIONS:  Ronald [name] is a  67 y.o. male with a diagnosis of Osteomyelitis Right Foot who failed conservative measures and elected for surgical management.    The risks benefits and alternatives were discussed with the patient preoperatively including but not limited to the risks of infection, bleeding, nerve injury, cardiopulmonary complications, the need for revision surgery, among others, and the patient was willing to proceed.  OPERATIVE IMPLANTS: Kerecis micro graft 38 cm and Kerecis sheet 7 x 10 cm.   OPERATIVE FINDINGS: calcified arteries, calcified anterior compartment  OPERATIVE PROCEDURE: Patient was brought to the operating room after undergoing a regional anesthetic.  After adequate levels anesthesia were obtained a thigh tourniquet was placed and the lower extremity was prepped using DuraPrep draped into a sterile field. The foot was draped out of the sterile field with impervious stockinette.  A timeout was called and the tourniquet inflated.  A transverse skin incision was made 12 cm distal to the tibial tubercle, the incision curved proximally, and a large posterior flap was created.  The tibia was transected just proximal to the skin incision and beveled anteriorly.  The fibula was transected just proximal to the tibial incision.  The sciatic nerve was pulled cut and allowed to retract.  The vascular bundles were suture ligated with 2-0 silk.  The tourniquet was deflated and hemostasis obtained.       The Kerecis micro powder 38 cm  was applied to the open wound that has a 200 cm surface area.   The deep and superficial fascial layers were closed using #1 Vicryl.  The skin was closed using staples.    The Prevena customizable dressing was applied this was overwrapped with the arthroform sponge.  [name] was used to secure the sponges and the circumferential compression was secured to the skin with Dermatac.  This was connected to the wound VAC pump and had a good suction fit this was covered with a stump shrinker and a limb protector.  Patient was taken to the PACU in stable condition.   DISCHARGE PLANNING:  Antibiotic duration: 24-hour antibiotics  Weightbearing: Nonweightbearing on the operative extremity  Pain medication: Opioid pathway  Dressing care/ Wound VAC: Continue wound VAC with the Prevena plus pump at discharge for 1 week  Ambulatory devices: transfer training  Discharge to: Discharge planning based on recommendations per physical therapy  Follow-up: In the office 1 week after discharge.

## 2022-11-17 NOTE — Transfer of Care (Signed)
Immediate Anesthesia Transfer of Care Note  Patient: [name]  Procedure(s) Performed: RIGHT BELOW KNEE AMPUTATION (Right: Knee)  Patient Location: PACU  Anesthesia Type:General  Level of Consciousness: drowsy  Airway & Oxygen Therapy: Patient Spontanous Breathing  Post-op Assessment: Report given to RN and Post -op Vital signs reviewed and stable  Post vital signs: Reviewed and stable  Last Vitals:  Vitals Value Taken Time  BP 172/119 11/17/22 1506  Temp    Pulse 110 11/17/22 1506  Resp 17 11/17/22 1507  SpO2 98 % 11/17/22 1506  Vitals shown include unvalidated device data.  Last Pain:  Vitals:   11/17/22 1505  TempSrc:   PainSc: 10-Worst pain ever      Patients Stated Pain Goal: 3 ([phone] [id number])  Complications: No notable events documented.

## 2022-11-17 NOTE — Progress Notes (Signed)
OT Cancellation Note  Patient Details Name: Milagro Trayer MRN: [id number] DOB: 1956/07/03   Cancelled Treatment:    Reason Eval/Treat Not Completed: Patient at procedure or test/ unavailable. Pt at surgery for BKA. Will return s/p surgery.   Elder Cyphers, OTR/L [facility] Acute Rehabilitation Office: [phone]   Magnus Ivan 11/17/2022, 2:54 PM

## 2022-11-17 NOTE — Plan of Care (Signed)
  Problem: Education: Goal: Ability to describe self-care measures that may prevent or decrease complications (Diabetes Survival Skills Education) will improve Outcome: Not Progressing   Problem: Coping: Goal: Ability to adjust to condition or change in health will improve Outcome: Not Progressing   Problem: Fluid Volume: Goal: Ability to maintain a balanced intake and output will improve Outcome: Not Progressing   Problem: Health Behavior/Discharge Planning: Goal: Ability to identify and utilize available resources and services will improve Outcome: Not Progressing Goal: Ability to manage health-related needs will improve Outcome: Not Progressing   Problem: Metabolic: Goal: Ability to maintain appropriate glucose levels will improve Outcome: Not Progressing

## 2022-11-17 NOTE — Progress Notes (Signed)
[facility] PROGRESS NOTE    Progress Note  [name]  [id number] DOB: June 10, 1956 DOA: 10/15/2022 PCP: Demetrios Isaacs, FNP     Brief Narrative:   Ronald [name] is an 67 y.o. male past medical history of polysubstance abuse including alcohol cocaine, noncompliance right healed unstageable pressure ulcer peripheral vascular disease, recently discharged from the hospital home as he refused SNF comes into the hospital for acute encephalopathy, hypoxic and hypercarbic noted to be aspirating, required intubation admitted to the ICU extubated on 10/16/2022 transferred to [facility] on 10/18/2022 currently awaiting skilled nursing facility placement.   Assessment/Plan:   Unstageable right heel pressure ulcer with early osteomyelitis: MRI of the foot show large ulceration and underlying osteomyelitis no abscess. Orthopedic surgery was consulted and performed amputation on 11/17/2022. Due to surrounding cellulitis he was continue on Augmentin which we will probably continue for 2 weeks postsurgical intervention.  Acute respiratory failure with hypoxia and hypercarbia: Now extubated on room air.  Proteus Mirabella's pneumonia: He will finish his course of antibiotic Augmentin should cover.  Acute on chronic hyponatremia in the setting of beer potomania: Sodium continues to improve, yesterday was 132.  Dysphagia: Tolerating a dysphagia 3 diet.  Electrolyte imbalance hypokalemia/hypomagnesemia: Monitor and replete as indicated.  Right jaw pain: CT scan showed no abscess.  Prediabetes mellitus type II: With an A1c of 5.6. Continue to monitor CBGs intermittently.  Chronic alcohol abuse/cocaine abuse: Continue folic acid thiamine and multivitamins no signs of withdrawal.  Peripheral vascular disease: History of left BKA.  Right elbow swelling cellulitis versus thrombophlebitis: No fracture now resolved. Respiratory failure ([place])  Stage II ischial tuberosity pressure ulcer  present on admission RN Pressure Injury Documentation: Pressure Injury 10/15/22 Ischial tuberosity Left;Posterior;Proximal Stage 2 -  Partial thickness loss of dermis presenting as a shallow open injury with a red, pink wound bed without slough. (Active)  10/15/22 2000  Location: Ischial tuberosity  Location Orientation: Left;Posterior;Proximal  Staging: Stage 2 -  Partial thickness loss of dermis presenting as a shallow open injury with a red, pink wound bed without slough.  Wound Description (Comments):   Present on Admission:   Dressing Type Foam - Lift dressing to assess site every shift 11/10/22 0900     Pressure Injury 10/15/22 Buttocks Mid;Upper Stage 1 -  Intact skin with non-blanchable redness of a localized area usually over a bony prominence. (Active)  10/15/22 1100  Location: Buttocks  Location Orientation: Mid;Upper  Staging: Stage 1 -  Intact skin with non-blanchable redness of a localized area usually over a bony prominence.  Wound Description (Comments):   Present on Admission:   Dressing Type Foam - Lift dressing to assess site every shift 11/16/22 2111     Pressure Injury 10/15/22 Heel Right Unstageable - Full thickness tissue loss in which the base of the injury is covered by slough (yellow, tan, gray, green or brown) and/or eschar (tan, brown or black) in the wound bed. (Active)  10/15/22 2000  Location: Heel  Location Orientation: Right  Staging: Unstageable - Full thickness tissue loss in which the base of the injury is covered by slough (yellow, tan, gray, green or brown) and/or eschar (tan, brown or black) in the wound bed.  Wound Description (Comments):   Present on Admission: Yes  Dressing Type Gauze (Comment) 11/16/22 0800    DVT prophylaxis: lovenox Family Communication:none Status is: Inpatient Remains inpatient appropriate because: Osteomyelitis    Code Status:     Code Status Orders  (From admission, onward)  Start     Ordered    10/15/22 0927  Full code  Continuous       Question:  By:  Answer:  Consent: discussion documented in EHR   10/15/22 0927           Code Status History     Date Active Date Inactive Code Status Order ID Comments User Context   09/19/2022 2106 09/30/2022 1914 Full Code [id number]  Etta Quill, DO ED   09/02/2022 1722 09/18/2022 0032 Full Code [id number]  Erick Colace, NP ED   04/15/2022 0038 04/16/2022 1817 Full Code [id number]  Etta Quill, DO ED   03/18/2019 0228 03/26/2019 2142 Full Code [id number]  Milagros Loll, MD ED   08/29/2017 1901 08/31/2017 2307 Full Code [id number]  Valinda Party, DO Inpatient   01/23/2017 0427 01/23/2017 1224 Full Code [id number]  Charlann Lange, PA-C ED   06/19/2014 0249 06/19/2014 1817 Full Code [id number]  Rise Patience, MD Inpatient   12/28/2013 1854 01/01/2014 2253 Full Code [id number]  Newt Minion, MD Inpatient   12/25/2013 1352 12/28/2013 1854 Full Code [id number]  Consuelo Pandy, PA-C Inpatient   11/06/2013 2025 11/10/2013 2336 Full Code [id number]  Sheila Oats, MD Inpatient   09/19/2013 0234 09/26/2013 2115 Full Code [id number]  Etta Quill, DO ED         IV Access:   Peripheral IV   Procedures and diagnostic studies:   No results found.   Medical Consultants:   None.   Subjective:    [facility] no complaints  Objective:    Vitals:   11/16/22 0835 11/16/22 1333 11/16/22 1922 11/17/22 0421  BP:  137/78 (!) 145/91 (!) 142/91  Pulse: 98 (!) 107 (!) 107 (!) 101  Resp: '18 18 18 17  '$ Temp:  98.3 F (36.8 C) 98.6 F (37 C) 98.4 F (36.9 C)  TempSrc:  Oral    SpO2: 100% 98% 99% 98%  Weight:      Height:       SpO2: 98 % O2 Flow Rate (L/min): 3 L/min FiO2 (%): 40 %   Intake/Output Summary (Last 24 hours) at 11/17/2022 0824 Last data filed at 11/17/2022 0447 Gross per 24 hour  Intake 1440 ml  Output 1400 ml  Net 40 ml   Filed Weights   11/10/22 0801 11/11/22 0500 11/12/22 0340  Weight: 65.5  kg 65.9 kg 68.9 kg    Exam: General exam: In no acute distress. Respiratory system: Good air movement and clear to auscultation. Cardiovascular system: S1 & S2 heard, RRR. No JVD. Gastrointestinal system: Abdomen is nondistended, soft and nontender.  Skin: No rashes, lesions or ulcers Psychiatry: Judgement and insight appear normal. Mood & affect appropriate.    Data Reviewed:    Labs: Basic Metabolic Panel: Recent Labs  Lab 11/14/22 0805 11/16/22 0400  NA 134* 132*  K 4.0 4.0  CL 94* 96*  CO2 29 26  GLUCOSE 110* 112*  BUN 13 14  CREATININE 0.50* 0.52*  CALCIUM 9.7 9.5  MG 1.7 1.7   GFR Estimated Creatinine Clearance: 86.1 mL/min (A) (by C-G formula based on SCr of 0.52 mg/dL (L)). Liver Function Tests: Recent Labs  Lab 11/14/22 0805  AST 16  ALT 11  ALKPHOS 53  BILITOT 0.3  PROT 7.4  ALBUMIN 3.8   No results for input(s): "LIPASE", "AMYLASE" in the last 168 hours. No results for input(s): "AMMONIA" in the last 168 hours. Coagulation profile No results for  input(s): "INR", "PROTIME" in the last 168 hours. COVID-19 Labs  No results for input(s): "DDIMER", "FERRITIN", "LDH", "CRP" in the last 72 hours.  Lab Results  Component Value Date   SARSCOV2NAA NEGATIVE 10/15/2022   SARSCOV2NAA NEGATIVE 09/19/2022   [place] NEGATIVE 10/18/2020   [place] NEGATIVE 03/18/2019    CBC: Recent Labs  Lab 11/14/22 0805 11/16/22 0400  WBC 7.9 8.5  HGB 11.0* 10.5*  HCT 33.8* 31.0*  MCV 95.8 94.2  PLT 403* 320   Cardiac Enzymes: No results for input(s): "CKTOTAL", "CKMB", "CKMBINDEX", "TROPONINI" in the last 168 hours. BNP (last 3 results) No results for input(s): "PROBNP" in the last 8760 hours. CBG: No results for input(s): "GLUCAP" in the last 168 hours. D-Dimer: No results for input(s): "DDIMER" in the last 72 hours. Hgb A1c: No results for input(s): "HGBA1C" in the last 72 hours. Lipid Profile: No results for input(s): "CHOL", "HDL", "LDLCALC",  "TRIG", "CHOLHDL", "LDLDIRECT" in the last 72 hours. Thyroid function studies: No results for input(s): "TSH", "T4TOTAL", "T3FREE", "THYROIDAB" in the last 72 hours.  Invalid input(s): "FREET3" Anemia work up: No results for input(s): "VITAMINB12", "FOLATE", "FERRITIN", "TIBC", "IRON", "RETICCTPCT" in the last 72 hours. Sepsis Labs: Recent Labs  Lab 11/14/22 0805 11/16/22 0400  WBC 7.9 8.5   Microbiology Recent Results (from the past 240 hour(s))  Surgical pcr screen     Status: None   Collection Time: 11/17/22  4:41 AM   Specimen: Nasal Mucosa; Nasal Swab  Result Value Ref Range Status   MRSA, PCR NEGATIVE NEGATIVE Final   Staphylococcus aureus NEGATIVE NEGATIVE Final    Comment: (NOTE) The Xpert SA Assay (FDA approved for NASAL specimens in patients 62 years of age and older), is one component of a comprehensive surveillance program. It is not intended to diagnose infection nor to guide or monitor treatment. Performed at [facility], [place] [address], [place], [place] [address]      Medications:    amoxicillin-clavulanate  1 tablet Oral Q12H   chlorhexidine  60 mL Topical Once   docusate  100 mg Oral BID   feeding supplement (NEPRO CARB STEADY)  237 mL Oral Q24H   fluticasone furoate-vilanterol  1 puff Inhalation Daily   folic acid  1 mg Oral Daily   heparin  5,000 Units Subcutaneous Q8H   magnesium oxide  400 mg Oral Daily   multivitamin with minerals  1 tablet Oral Daily   nutrition supplement (JUVEN)  1 packet Oral BID BM   pantoprazole  40 mg Oral Daily   polyethylene glycol  17 g Oral Daily   povidone-iodine  2 Application Topical Once   sodium hypochlorite   Irrigation BID   thiamine  100 mg Oral Daily   tranexamic acid (CYKLOKAPRON) 2,000 mg in sodium chloride 0.9 % 50 mL Topical Application  123XX123 mg Topical To OR   Continuous Infusions:   ceFAZolin (ANCEF) IV     tranexamic acid        LOS: 33 days   Charlynne Cousins  [facility]  11/17/2022, 8:24 AM

## 2022-11-18 ENCOUNTER — Encounter (HOSPITAL_COMMUNITY): Payer: Self-pay | Admitting: Orthopedic Surgery

## 2022-11-18 DIAGNOSIS — J441 Chronic obstructive pulmonary disease with (acute) exacerbation: Secondary | ICD-10-CM | POA: Diagnosis not present

## 2022-11-18 DIAGNOSIS — E871 Hypo-osmolality and hyponatremia: Secondary | ICD-10-CM | POA: Diagnosis not present

## 2022-11-18 DIAGNOSIS — J1569 Pneumonia due to other gram-negative bacteria: Secondary | ICD-10-CM | POA: Diagnosis not present

## 2022-11-18 DIAGNOSIS — F1010 Alcohol abuse, uncomplicated: Secondary | ICD-10-CM | POA: Diagnosis not present

## 2022-11-18 DIAGNOSIS — J9601 Acute respiratory failure with hypoxia: Secondary | ICD-10-CM | POA: Diagnosis not present

## 2022-11-18 MED ORDER — METOPROLOL TARTRATE 25 MG PO TABS
25.0000 mg | ORAL_TABLET | Freq: Two times a day (BID) | ORAL | Status: DC
  Filled 2022-11-18: qty 1

## 2022-11-18 MED ORDER — HYDROMORPHONE HCL 1 MG/ML IJ SOLN
0.5000 mg | Freq: Once | INTRAMUSCULAR | Status: AC
  Administered 2022-11-18: 0.5 mg via INTRAVENOUS
  Filled 2022-11-18: qty 0.5

## 2022-11-18 MED ORDER — METOPROLOL TARTRATE 5 MG/5ML IV SOLN
5.0000 mg | Freq: Four times a day (QID) | INTRAVENOUS | Status: DC | PRN
  Administered 2022-11-18: 5 mg via INTRAVENOUS

## 2022-11-18 NOTE — Evaluation (Signed)
Physical Therapy Re-Evaluation Patient Details Name: Ronald [name] MRN: [id number] DOB: Jul 31, 1956 Today's Date: 11/18/2022  History of Present Illness  This is a 67 year old gentleman admitted 2/2 after being found down at home after a recent d/c from hospital where he refused SNF. Pt was hyponatremic hypoxemic hypercarbic was placed on BiPAP in the emergency room. Ultimately failed BiPAP requiring intubation mechanical ventilation 2/2--2/3. 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 PMH: polysubstance abuse, cocaine, alcohol use.  Clinical Impression  Patient re-evaluated following Rt BKA 3/6. Previously d/c from acute PT services after multiple refusals and non-compliance. Currently appropriate for physical therapy in acute setting to assist with BKA education and transfer training.  Required mod assist +2 for posterior scoot. Pt pre-medicated, not very conversant but agreeable to get OOB. Rt knee severely contracted, unable to donne amputee splint which was lying in bed when PT entered room. Modified prevalon boot  to unload pressure from wound bed, and gentle pressure applied to straps to facilitate Rt knee extension. Patient will continue to benefit from skilled physical therapy services to further improve independence with functional mobility. Pt currently with functional limitations due to the deficits listed below (see PT Problem List). Pt will benefit from skilled PT to increase their independence and safety with mobility.       Recommendations for follow up therapy are one component of a multi-disciplinary discharge planning process, led by the attending physician.  Recommendations may be updated based on patient status, additional functional criteria and insurance authorization.  Follow Up Recommendations Skilled nursing-short term rehab (<3 hours/day) Can patient physically be transported by private vehicle: No    Assistance Recommended at Discharge Frequent or constant Supervision/Assistance  Patient can  return home with the following  A lot of help with bathing/dressing/bathroom;Assistance with cooking/housework;Direct supervision/assist for medications management;Direct supervision/assist for financial management;Assist for transportation;Help with stairs or ramp for entrance;Two people to help with walking and/or transfers    Equipment Recommendations Wheelchair (measurements PT);Wheelchair cushion (measurements PT)  Recommendations for Other Services       Functional Status Assessment Patient has had a recent decline in their functional status and demonstrates the ability to make significant improvements in function in a reasonable and predictable amount of time.     Precautions / Restrictions Precautions Precautions: Fall Precaution Comments: 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) . New Rt BKA Required Braces or Orthoses:  (Amputee splint - (unable to donne due to Rt knee contraction.)) Restrictions Weight Bearing Restrictions: Yes RLE Weight Bearing: Non weight bearing      Mobility  Bed Mobility Overal bed mobility: Needs Assistance Bed Mobility: Supine to Sit     Supine to sit: +2 for physical assistance, +2 for safety/equipment, HOB elevated, Mod assist     General bed mobility comments: Mod assist for patient to pull through therapist hand and rotate in bed for set-up with posterior scoot transfer. +2 assist for rotating in bed using bed pad to pivot with VC and hand hand placement to facilitate.    Transfers Overall transfer level: Needs assistance   Transfers: Bed to chair/wheelchair/BSC         Anterior-Posterior transfers: Mod assist, +2 physical assistance   General transfer comment: Needs max VC and set-up assist for hand placement, cues to lean forward and push through BIL UEs while therapists assisted with posterior scoot transfer in 3 stages. Pt does initiate good push through UEs but was unable to scoot fully without the external support and assist with  bed pad.  Ambulation/Gait               General Gait Details: N/a  Stairs            Wheelchair Mobility    Modified Rankin (Stroke Patients Only)       Balance Overall balance assessment: Needs assistance Sitting-balance support: Bilateral upper extremity supported Sitting balance-Leahy Scale: Poor Sitting balance - Comments: Provided CGA at all times, appears to be able to brace himself without additional external support however due to reduced cognition did not allow pt to sit unsupported this date. Postural control: Posterior lean                                   Pertinent Vitals/Pain Pain Assessment Pain Assessment: Faces Faces Pain Scale: Hurts even more Pain Location: Rt residual limb Pain Descriptors / Indicators: Grimacing, Guarding Pain Intervention(s): Monitored during session, Premedicated before session, Repositioned, Limited activity within patient's tolerance    Home Living Family/patient expects to be discharged to:: Private residence Living Arrangements: Non-relatives/Friends Available Help at Discharge: Available 24 hours/day Type of Home: House Home Access: Ramped entrance       Home Layout: One level Home Equipment: BSC/3in1;Wheelchair - manual Additional Comments: prosthetic doesnt fit per pt    Prior Function Prior Level of Function : Patient poor historian/Family not available;Other (comment) (unsure, as per chart, pt requires  assist, but per pt, he was independent)             Mobility Comments: Reports he could transfer himself in and out of wc ADLs Comments: reports IND; sponge bathes at baseline     Hand Dominance   Dominant Hand: Right    Extremity/Trunk Assessment   Upper Extremity Assessment Upper Extremity Assessment: Defer to OT evaluation    Lower Extremity Assessment Lower Extremity Assessment: RLE deficits/detail;LLE deficits/detail RLE Deficits / Details: Recent BKA Rt, wearing wound  vac. Seems severely contracted at knee. RLE: Unable to fully assess due to pain LLE Deficits / Details: Healed L-BKA, appears to be developing contracture but has increased tone right now in bil LEs. possible guarding from pain.    Cervical / Trunk Assessment Cervical / Trunk Assessment: Normal  Communication   Communication: No difficulties  Cognition Arousal/Alertness: Lethargic, Suspect due to medications Behavior During Therapy: Flat affect Overall Cognitive Status: No family/caregiver present to determine baseline cognitive functioning Area of Impairment: Problem solving, Following commands, Attention, Awareness                   Current Attention Level: Selective Memory: Decreased recall of precautions Following Commands: Follows one step commands with increased time, Follows one step commands inconsistently Safety/Judgement: Decreased awareness of deficits Awareness: Intellectual Problem Solving: Slow processing, Requires verbal cues, Requires tactile cues, Decreased initiation, Difficulty sequencing General Comments: minimally verbal, recent pain medication administration. Agreeable to get OOB to chair.        General Comments General comments (skin integrity, edema, etc.): Could not extend Rt knee to wear amputation splint - modified prevalon boot to unload residual limb and straps to help facilitate Rt knee extension.    Exercises     Assessment/Plan    PT Assessment Patient needs continued PT services  PT Problem List Decreased strength;Decreased range of motion;Decreased activity tolerance;Decreased balance;Decreased mobility;Decreased cognition;Decreased knowledge of use of DME;Decreased safety awareness;Decreased knowledge of precautions;Pain       PT Treatment Interventions DME instruction;Functional mobility  training;Therapeutic activities;Therapeutic exercise;Balance training;Neuromuscular re-education;Cognitive remediation;Patient/family education;Gait  training;Wheelchair mobility training    PT Goals (Current goals can be found in the Care Plan section)  Acute Rehab PT Goals Patient Stated Goal: none stated PT Goal Formulation: With patient Time For Goal Achievement: 12/02/22 Potential to Achieve Goals: Fair    Frequency Min 3X/week     Co-evaluation PT/OT/SLP Co-Evaluation/Treatment: Yes Reason for Co-Treatment: To address functional/ADL transfers;For patient/therapist safety PT goals addressed during session: Mobility/safety with mobility;Strengthening/ROM;Balance         AM-PAC PT "6 Clicks" Mobility  Outcome Measure Help needed turning from your back to your side while in a flat bed without using bedrails?: A Lot Help needed moving from lying on your back to sitting on the side of a flat bed without using bedrails?: A Lot Help needed moving to and from a bed to a chair (including a wheelchair)?: Total Help needed standing up from a chair using your arms (e.g., wheelchair or bedside chair)?: Total Help needed to walk in hospital room?: Total Help needed climbing 3-5 steps with a railing? : Total 6 Click Score: 8    End of Session Equipment Utilized During Treatment: Oxygen Activity Tolerance: Patient limited by lethargy Patient left: with call bell/phone within reach;in chair;with chair alarm set;with nursing/sitter in room Nurse Communication: Mobility status PT Visit Diagnosis: Other abnormalities of gait and mobility (R26.89);Muscle weakness (generalized) (M62.81);History of falling (Z91.81)    Time: [id number] PT Time Calculation (min) (ACUTE ONLY): 19 min   Charges:   PT Evaluation $PT Re-evaluation: 1 Re-eval          Candie Mile, PT, DPT Physical Therapist Acute Rehabilitation Services [place]   Ellouise Newer 11/18/2022, 3:01 PM

## 2022-11-18 NOTE — Progress Notes (Signed)
An USGPIV (ultrasound guided PIV) has been placed for short-term vasopressor infusion. A correctly placed ivWatch must be used when administering Vasopressors. Should this treatment be needed beyond 72 hours, central line access should be obtained.  It will be the responsibility of the bedside nurse to follow best practice to prevent extravasations.   

## 2022-11-18 NOTE — Plan of Care (Signed)
  Problem: Education: Goal: Ability to describe self-care measures that may prevent or decrease complications (Diabetes Survival Skills Education) will improve Outcome: Progressing   Problem: Coping: Goal: Ability to adjust to condition or change in health will improve Outcome: Progressing   Problem: Fluid Volume: Goal: Ability to maintain a balanced intake and output will improve Outcome: Progressing   Problem: Health Behavior/Discharge Planning: Goal: Ability to identify and utilize available resources and services will improve 11/18/2022 1237 by Claudia Pollock, LPN Outcome: Progressing 11/18/2022 1232 by Claudia Pollock, LPN Outcome: Progressing Goal: Ability to manage health-related needs will improve 11/18/2022 1237 by Claudia Pollock, LPN Outcome: Progressing 11/18/2022 1232 by Claudia Pollock, LPN Outcome: Progressing   Problem: Metabolic: Goal: Ability to maintain appropriate glucose levels will improve 11/18/2022 1237 by Claudia Pollock, LPN Outcome: Progressing 11/18/2022 1232 by Claudia Pollock, LPN Outcome: Progressing   Problem: Nutritional: Goal: Maintenance of adequate nutrition will improve 11/18/2022 1237 by Claudia Pollock, LPN Outcome: Progressing 11/18/2022 1232 by Claudia Pollock, LPN Outcome: Progressing Goal: Progress toward achieving an optimal weight will improve Outcome: Progressing   Problem: Skin Integrity: Goal: Risk for impaired skin integrity will decrease Outcome: Progressing   Problem: Tissue Perfusion: Goal: Adequacy of tissue perfusion will improve Outcome: Progressing   Problem: Education: Goal: Knowledge of General Education information will improve Description: Including pain rating scale, medication(s)/side effects and non-pharmacologic comfort measures Outcome: Progressing

## 2022-11-18 NOTE — Progress Notes (Signed)
IP rehab admissions - Noted PT/OT recommending SNF placement.  Also noted Dr. Sharol Given feels patient likely will need SNF.  I spoke with Dr. Naaman Plummer who says patient is not a candidate for CIR and will need SNF placement.  I will sign off for CIR at this time.  Call me for questions.  [phone]

## 2022-11-18 NOTE — Progress Notes (Signed)
Patient ID: Ronald [name], male   DOB: 02/06/1956, 67 y.o.   MRN: [id number] Patient is postoperative day 1 right transtibial amputation.  Patient does have a flexion contracture of the right knee.  Patient is a bilateral transtibial amputation.  There is no drainage in the wound VAC canister.  Anticipate discharge to skilled nursing facility.

## 2022-11-18 NOTE — Progress Notes (Signed)
Nutrition Follow-up  DOCUMENTATION CODES:  Severe malnutrition in context of chronic illness  INTERVENTION:  Discussed diet with MD; MD agrees to regular diet Nepro Shake po once daily, each supplement provides 425 kcal and 19 grams protein Juven BID to support wound healing MVI with minerals  NUTRITION DIAGNOSIS:  Severe Malnutrition related to chronic illness (COPD, polysubstance abuse) as evidenced by severe fat depletion, severe muscle depletion. - ongoing  GOAL:  Patient will meet greater than or equal to 90% of their needs - goal met  MONITOR:  PO intake, Labs, Weight trends  REASON FOR ASSESSMENT:  Ventilator, Consult Enteral/tube feeding initiation and management  ASSESSMENT:  67 year old male who presented to the ED on 2/02 with SOB, chest pain, and AMS. PMH of COPD, ETOH abuse, cocaine abuse, noncompliance, DM, PVD s/p L BKA. Pt required intubation. Pt admitted with acute respiratory failure, hyponatremia.  2/3: extubated 2/4: s/p BSE- recommend dysphagia 3, nectar thick liquids  2/10: s/p MBS-continue dysphagia 3, liquids adv to thin  3/7: s/p R transtibial amputation + wound VAC   Patient has been on dysphagia 3 diet per ST, however has remained non-compliant eating foods such as peanut butter and crackers. Pt had been informed by staff the risks associated with this. No complications reported. Discussed with MD who agreed to regular diet.  Pt has remained with excellent PO intake throughout admission.  Meal completions: 100% x 8 recorded meals (03/02-03/05) He continues to consume Nepro shakes and Juven supplements.   Admit weight: 59 kg Current weight: 70 kg  Medications: Vitamin C, colace, folvite, MVI, protonix, miralax, thiamine, zinc, IV abx  Labs: reviewed  Diet Order:   Diet Order             Diet regular Room service appropriate? Yes; Fluid consistency: Thin  Diet effective now                   EDUCATION NEEDS:  Not appropriate for  education at this time  Skin:  Skin Assessment: Skin Integrity Issues: Skin Integrity Issues:: Incisions, Stage I Stage I: mid upper buttocks Stage II: ischial tuberosity Incisions: R leg (closed)  Last BM:  3/5 (type 6)  Height:  Ht Readings from Last 1 Encounters:  11/17/22 '5\' 7"'$  (1.702 m)    Weight:  Wt Readings from Last 1 Encounters:  11/17/22 70 kg   BMI:  Body mass index is 24.17 kg/m.  Estimated Nutritional Needs:   Kcal:  1850-2050  Protein:  85-105 grams  Fluid:  1.8-2.0 L  Ronald [name], RDN, LDN Clinical Nutrition

## 2022-11-18 NOTE — Progress Notes (Signed)
Occupational Therapy Re-evaluation/Treatment Patient Details Name: Ronald [name] MRN: [id number] DOB: 1956-07-06 Today's Date: 11/18/2022   History of present illness This is a 67 year old gentleman admitted 2/2 after being found down at home after a recent d/c from hospital where he refused SNF. Pt was hyponatremic hypoxemic hypercarbic was placed on BiPAP in the emergency room. Ultimately failed BiPAP requiring intubation mechanical ventilation 2/2--2/3. 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 PMH: polysubstance abuse, cocaine, alcohol use.   OT comments  Pt now s/p R BKA, so performing re-eval. Upon re-eval, pt with decreased attempts to verbalize. Requiring max cues for sequencing and safety during all mobility. Mod+2 to scoot posteriorly to chair. Pt with R knee in flexion and unable to extend (minimal ROM), thus unable to don R limy protector. Will continue to follow acutely. Pt to benefit from continued OT at SNF to optimize safety and independence in ADL.    Recommendations for follow up therapy are one component of a multi-disciplinary discharge planning process, led by the attending physician.  Recommendations may be updated based on patient status, additional functional criteria and insurance authorization.    Follow Up Recommendations  Skilled nursing-short term rehab (<3 hours/day)     Assistance Recommended at Discharge Frequent or constant Supervision/Assistance  Patient can return home with the following  A little help with walking and/or transfers;A little help with bathing/dressing/bathroom;Assistance with cooking/housework;Direct supervision/assist for financial management;Direct supervision/assist for medications management;Help with stairs or ramp for entrance   Equipment Recommendations  None recommended by OT    Recommendations for Other Services      Precautions / Restrictions Precautions Precautions: Fall Precaution Comments: 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) . New Rt BKA Required  Braces or Orthoses:  (Amputee splint - (unable to donne due to Rt knee contraction.)) Restrictions Weight Bearing Restrictions: Yes RLE Weight Bearing: Non weight bearing       Mobility Bed Mobility Overal bed mobility: Needs Assistance Bed Mobility: Supine to Sit     Supine to sit: +2 for physical assistance, +2 for safety/equipment, HOB elevated, Mod assist     General bed mobility comments: Mod assist for patient to pull through therapist hand and rotate in bed for set-up with posterior scoot transfer. +2 assist for rotating in bed using bed pad to pivot with VC and hand hand placement to facilitate.    Transfers Overall transfer level: Needs assistance   Transfers: Bed to chair/wheelchair/BSC         Anterior-Posterior transfers: Mod assist, +2 physical assistance   General transfer comment: Needs max VC and set-up assist for hand placement, cues to lean forward and push through BIL UEs while therapists assisted with posterior scoot transfer in 3 stages. Pt does initiate good push through UEs but was unable to scoot fully without the external support and assist with bed pad.     Balance Overall balance assessment: Needs assistance Sitting-balance support: Bilateral upper extremity supported Sitting balance-Leahy Scale: Poor Sitting balance - Comments: Provided CGA at all times, appears to be able to brace himself without additional external support however due to reduced cognition did not allow pt to sit unsupported this date. Postural control: Posterior lean                                 ADL either performed or assessed with clinical judgement   ADL Overall ADL's : Needs assistance/impaired  Toilet Transfer: +2 for physical assistance;Moderate assistance;Anterior/posterior Armed forces technical officer Details (indicate cue type and reason): to recliner                Extremity/Trunk Assessment Upper Extremity  Assessment Upper Extremity Assessment: Generalized weakness   Lower Extremity Assessment Lower Extremity Assessment: Defer to PT evaluation RLE Deficits / Details: Recent BKA Rt, wearing wound vac. Seems severely contracted at knee. RLE: Unable to fully assess due to pain LLE Deficits / Details: Healed L-BKA, appears to be developing contracture but has increased tone right now in bil LEs. possible guarding from pain.   Cervical / Trunk Assessment Cervical / Trunk Assessment: Normal    Vision   Vision Assessment?: No apparent visual deficits   Perception     Praxis      Cognition Arousal/Alertness: Lethargic, Suspect due to medications Behavior During Therapy: Flat affect Overall Cognitive Status: No family/caregiver present to determine baseline cognitive functioning Area of Impairment: Problem solving, Following commands, Attention, Awareness                   Current Attention Level: Selective Memory: Decreased recall of precautions Following Commands: Follows one step commands with increased time, Follows one step commands inconsistently Safety/Judgement: Decreased awareness of deficits Awareness: Intellectual Problem Solving: Slow processing, Requires verbal cues, Requires tactile cues, Decreased initiation, Difficulty sequencing General Comments: minimally verbal, recent pain medication administration. Agreeable to get OOB to chair. Significantly increased time to respond verbally, multimodal cues and step by step instruction for all mobility        Exercises      Shoulder Instructions       General Comments Could not extend Rt knee to wear amputation splint - modified prevalon boot to unload residual limb and straps to help facilitate Rt knee extension.    Pertinent Vitals/ Pain       Pain Assessment Faces Pain Scale: Hurts even more Pain Location: Rt residual limb Pain Descriptors / Indicators: Grimacing, Guarding Pain Intervention(s): Monitored  during session, Premedicated before session, Limited activity within patient's tolerance, Relaxation  Home Living Family/patient expects to be discharged to:: Private residence Living Arrangements: Non-relatives/Friends Available Help at Discharge: Available 24 hours/day Type of Home: House Home Access: Ramped entrance     Home Layout: One level     Bathroom Shower/Tub: Occupational psychologist: Standard     Home Equipment: BSC/3in1;Wheelchair - manual   Additional Comments: prosthetic doesnt fit per pt      Prior Functioning/Environment              Frequency  Min 2X/week        Progress Toward Goals  OT Goals(current goals can now be found in the care plan section)  Progress towards OT goals: Progressing toward goals  Acute Rehab OT Goals Patient Stated Goal: none stated OT Goal Formulation: With patient Time For Goal Achievement: 12/01/22 Potential to Achieve Goals: Good ADL Goals Pt Will Transfer to Toilet: with min assist;anterior/posterior transfer  Plan Discharge plan remains appropriate    Co-evaluation      Reason for Co-Treatment: To address functional/ADL transfers;For patient/therapist safety PT goals addressed during session: Mobility/safety with mobility;Strengthening/ROM;Balance        AM-PAC OT "6 Clicks" Daily Activity     Outcome Measure   Help from another person eating meals?: None Help from another person taking care of personal grooming?: A Little Help from another person toileting, which includes using toliet, bedpan, or urinal?: A Little  Help from another person bathing (including washing, rinsing, drying)?: A Little Help from another person to put on and taking off regular upper body clothing?: A Little Help from another person to put on and taking off regular lower body clothing?: A Lot 6 Click Score: 18    End of Session    OT Visit Diagnosis: Unsteadiness on feet (R26.81);Other abnormalities of gait and  mobility (R26.89);Muscle weakness (generalized) (M62.81)   Activity Tolerance Patient tolerated treatment well   Patient Left in chair;with call bell/phone within reach;with chair alarm set;with nursing/sitter in room   Nurse Communication Mobility status        Time: [id number] OT Time Calculation (min): 19 min  Charges: OT General Charges $OT Visit: 1 Visit OT Evaluation $OT Re-eval: 1 Re-eval  Elder Cyphers, OTR/L [facility] Acute Rehabilitation Office: [phone]   Magnus Ivan 11/18/2022, 3:05 PM

## 2022-11-18 NOTE — Plan of Care (Signed)
  Problem: Education: Goal: Ability to describe self-care measures that may prevent or decrease complications (Diabetes Survival Skills Education) will improve Outcome: Progressing   Problem: Coping: Goal: Ability to adjust to condition or change in health will improve Outcome: Progressing   Problem: Fluid Volume: Goal: Ability to maintain a balanced intake and output will improve Outcome: Progressing   Problem: Health Behavior/Discharge Planning: Goal: Ability to identify and utilize available resources and services will improve Outcome: Progressing Goal: Ability to manage health-related needs will improve Outcome: Progressing   Problem: Metabolic: Goal: Ability to maintain appropriate glucose levels will improve Outcome: Progressing   Problem: Nutritional: Goal: Maintenance of adequate nutrition will improve Outcome: Progressing Goal: Progress toward achieving an optimal weight will improve Outcome: Progressing   Problem: Skin Integrity: Goal: Risk for impaired skin integrity will decrease Outcome: Progressing   Problem: Tissue Perfusion: Goal: Adequacy of tissue perfusion will improve Outcome: Progressing   Problem: Education: Goal: Knowledge of General Education information will improve Description: Including pain rating scale, medication(s)/side effects and non-pharmacologic comfort measures Outcome: Progressing   Problem: Health Behavior/Discharge Planning: Goal: Ability to manage health-related needs will improve Outcome: Progressing   Problem: Clinical Measurements: Goal: Ability to maintain clinical measurements within normal limits will improve Outcome: Progressing Goal: Will remain free from infection Outcome: Progressing Goal: Diagnostic test results will improve Outcome: Progressing Goal: Respiratory complications will improve Outcome: Progressing Goal: Cardiovascular complication will be avoided Outcome: Progressing   Problem: Activity: Goal:  Risk for activity intolerance will decrease Outcome: Progressing   Problem: Nutrition: Goal: Adequate nutrition will be maintained Outcome: Progressing   Problem: Coping: Goal: Level of anxiety will decrease Outcome: Progressing   Problem: Elimination: Goal: Will not experience complications related to bowel motility Outcome: Progressing Goal: Will not experience complications related to urinary retention Outcome: Progressing   Problem: Pain Managment: Goal: General experience of comfort will improve Outcome: Progressing   Problem: Safety: Goal: Ability to remain free from injury will improve Outcome: Progressing   Problem: Skin Integrity: Goal: Risk for impaired skin integrity will decrease Outcome: Progressing   Problem: Safety: Goal: Non-violent Restraint(s) Outcome: Progressing   Problem: Education: Goal: Knowledge of the prescribed therapeutic regimen will improve Outcome: Progressing Goal: Ability to verbalize activity precautions or restrictions will improve Outcome: Progressing Goal: Understanding of discharge needs will improve Outcome: Progressing   Problem: Activity: Goal: Ability to perform//tolerate increased activity and mobilize with assistive devices will improve Outcome: Progressing   Problem: Clinical Measurements: Goal: Postoperative complications will be avoided or minimized Outcome: Progressing   Problem: Self-Care: Goal: Ability to meet self-care needs will improve Outcome: Progressing   Problem: Self-Concept: Goal: Ability to maintain and perform role responsibilities to the fullest extent possible will improve Outcome: Progressing   Problem: Pain Management: Goal: Pain level will decrease with appropriate interventions Outcome: Progressing   Problem: Skin Integrity: Goal: Demonstration of wound healing without infection will improve Outcome: Progressing

## 2022-11-18 NOTE — Plan of Care (Signed)

## 2022-11-18 NOTE — Progress Notes (Signed)
[facility] PROGRESS NOTE    Progress Note  [name]  [id number] DOB: 12-30-1955 DOA: 10/15/2022 PCP: Ronald Isaacs, FNP     Brief Narrative:   Ronald [name] is an 67 y.o. male past medical history of polysubstance abuse including alcohol cocaine, noncompliance right healed unstageable pressure ulcer peripheral vascular disease, recently discharged from the hospital home as he refused SNF comes into the hospital for acute encephalopathy, hypoxic and hypercarbic noted to be aspirating, required intubation admitted to the ICU extubated on 10/16/2022 transferred to [facility] 10/18/2022 currently awaiting skilled nursing facility placement.   Assessment/Plan:   Unstageable right heel pressure ulcer with early osteomyelitis: MRI of the foot show large ulceration and underlying osteomyelitis no abscess. Orthopedic surgery was consulted and performed amputation on 11/17/2022 with wound VAC placement. Due to surrounding cellulitis he was continue on Augmentin which we will probably continue for 2 weeks postsurgical intervention. PT OT eval is pending, anticipate skilled nursing facility needs.  Acute respiratory failure with hypoxia and hypercarbia: Now extubated on room air.  Proteus Mirabella's pneumonia: He will finish his course of antibiotic Augmentin should cover.  Acute on chronic hyponatremia in the setting of beer potomania: Has returned to his baseline of 1 30-1 34.  Dysphagia: Tolerating a dysphagia 3 diet.  Electrolyte imbalance hypokalemia/hypomagnesemia: Monitor and replete as indicated.  Right jaw pain: CT scan showed no abscess.  Prediabetes mellitus type II: With an A1c of 5.6. Continue to monitor CBGs intermittently.  Chronic alcohol abuse/cocaine abuse: Continue folic acid thiamine and multivitamins no signs of withdrawal.  Peripheral vascular disease: History of left BKA.  Right elbow swelling cellulitis versus thrombophlebitis:  Stage II  ischial tuberosity pressure ulcer present on admission RN Pressure Injury Documentation: Pressure Injury 10/15/22 Ischial tuberosity Left;Posterior;Proximal Stage 2 -  Partial thickness loss of dermis presenting as a shallow open injury with a red, pink wound bed without slough. (Active)  10/15/22 2000  Location: Ischial tuberosity  Location Orientation: Left;Posterior;Proximal  Staging: Stage 2 -  Partial thickness loss of dermis presenting as a shallow open injury with a red, pink wound bed without slough.  Wound Description (Comments):   Present on Admission:   Dressing Type Foam - Lift dressing to assess site every shift 11/10/22 0900     Pressure Injury 10/15/22 Buttocks Mid;Upper Stage 1 -  Intact skin with non-blanchable redness of a localized area usually over a bony prominence. (Active)  10/15/22 1100  Location: Buttocks  Location Orientation: Mid;Upper  Staging: Stage 1 -  Intact skin with non-blanchable redness of a localized area usually over a bony prominence.  Wound Description (Comments):   Present on Admission:   Dressing Type Foam - Lift dressing to assess site every shift 11/17/22 1924     Pressure Injury 10/15/22 Heel Right Unstageable - Full thickness tissue loss in which the base of the injury is covered by slough (yellow, tan, gray, green or brown) and/or eschar (tan, brown or black) in the wound bed. (Active)  10/15/22 2000  Location: Heel  Location Orientation: Right  Staging: Unstageable - Full thickness tissue loss in which the base of the injury is covered by slough (yellow, tan, gray, green or brown) and/or eschar (tan, brown or black) in the wound bed.  Wound Description (Comments):   Present on Admission: Yes  Dressing Type Gauze (Comment) 11/17/22 0800    DVT prophylaxis: lovenox Family Communication:none Status is: Inpatient Remains inpatient appropriate because: Osteomyelitis    Code Status:  Code Status Orders  (From admission, onward)            Start     Ordered   10/15/22 0927  Full code  Continuous       Question:  By:  Answer:  Consent: discussion documented in EHR   10/15/22 0927           Code Status History     Date Active Date Inactive Code Status Order ID Comments User Context   09/19/2022 2106 09/30/2022 1914 Full Code [id number]  Etta Quill, DO ED   09/02/2022 1722 09/18/2022 0032 Full Code [id number]  Erick Colace, NP ED   04/15/2022 0038 04/16/2022 1817 Full Code [id number]  Etta Quill, DO ED   03/18/2019 0228 03/26/2019 2142 Full Code [id number]  Milagros Loll, MD ED   08/29/2017 1901 08/31/2017 2307 Full Code [id number]  Valinda Party, DO Inpatient   01/23/2017 0427 01/23/2017 1224 Full Code [id number]  Charlann Lange, PA-C ED   06/19/2014 0249 06/19/2014 1817 Full Code [id number]  Rise Patience, MD Inpatient   12/28/2013 1854 01/01/2014 2253 Full Code [id number]  Newt Minion, MD Inpatient   12/25/2013 1352 12/28/2013 1854 Full Code [id number]  Consuelo Pandy, PA-C Inpatient   11/06/2013 2025 11/10/2013 2336 Full Code [id number]  Sheila Oats, MD Inpatient   09/19/2013 0234 09/26/2013 2115 Full Code [id number]  Etta Quill, DO ED         IV Access:   Peripheral IV   Procedures and diagnostic studies:   No results found.   Medical Consultants:   None.   Subjective:    [name] has no new complaints he has agreed to proceed with skilled nursing facility placement.  Objective:    Vitals:   11/17/22 1520 11/17/22 1527 11/17/22 1535 11/17/22 1924  BP: (!) 169/101 (!) 158/98 (!) 160/86 118/68  Pulse: (!) 101  100 (!) 109  Resp: '11  19 20  '$ Temp:   98.5 F (36.9 C) 98.4 F (36.9 C)  TempSrc:    Oral  SpO2: 92%  94% 96%  Weight:      Height:       SpO2: 96 % O2 Flow Rate (L/min): 2 L/min FiO2 (%): 40 %   Intake/Output Summary (Last 24 hours) at 11/18/2022 [id number] Last data filed at 11/17/2022 1459 Gross per 24 hour  Intake 600 ml  Output 600 ml   Net 0 ml    Filed Weights   11/11/22 0500 11/12/22 0340 11/17/22 1317  Weight: 65.9 kg 68.9 kg 70 kg    Exam: General exam: In no acute distress. Respiratory system: Good air movement and clear to auscultation. Cardiovascular system: S1 & S2 heard, RRR. No JVD. Gastrointestinal system: Abdomen is nondistended, soft and nontender.  Skin: No rashes, lesions or ulcers Psychiatry: Judgement and insight appear normal. Mood & affect appropriate.   Data Reviewed:    Labs: Basic Metabolic Panel: Recent Labs  Lab 11/14/22 0805 11/16/22 0400  NA 134* 132*  K 4.0 4.0  CL 94* 96*  CO2 29 26  GLUCOSE 110* 112*  BUN 13 14  CREATININE 0.50* 0.52*  CALCIUM 9.7 9.5  MG 1.7 1.7    GFR Estimated Creatinine Clearance: 86.1 mL/min (A) (by C-G formula based on SCr of 0.52 mg/dL (L)). Liver Function Tests: Recent Labs  Lab 11/14/22 0805  AST 16  ALT 11  ALKPHOS 53  BILITOT 0.3  PROT 7.4  ALBUMIN 3.8  No results for input(s): "LIPASE", "AMYLASE" in the last 168 hours. No results for input(s): "AMMONIA" in the last 168 hours. Coagulation profile No results for input(s): "INR", "PROTIME" in the last 168 hours. COVID-19 Labs  No results for input(s): "DDIMER", "FERRITIN", "LDH", "CRP" in the last 72 hours.  Lab Results  Component Value Date   SARSCOV2NAA NEGATIVE 10/15/2022   SARSCOV2NAA NEGATIVE 09/19/2022   [place] NEGATIVE 10/18/2020   [place] NEGATIVE 03/18/2019    CBC: Recent Labs  Lab 11/14/22 0805 11/16/22 0400  WBC 7.9 8.5  HGB 11.0* 10.5*  HCT 33.8* 31.0*  MCV 95.8 94.2  PLT 403* 320    Cardiac Enzymes: No results for input(s): "CKTOTAL", "CKMB", "CKMBINDEX", "TROPONINI" in the last 168 hours. BNP (last 3 results) No results for input(s): "PROBNP" in the last 8760 hours. CBG: No results for input(s): "GLUCAP" in the last 168 hours. D-Dimer: No results for input(s): "DDIMER" in the last 72 hours. Hgb A1c: No results for input(s):  "HGBA1C" in the last 72 hours. Lipid Profile: No results for input(s): "CHOL", "HDL", "LDLCALC", "TRIG", "CHOLHDL", "LDLDIRECT" in the last 72 hours. Thyroid function studies: No results for input(s): "TSH", "T4TOTAL", "T3FREE", "THYROIDAB" in the last 72 hours.  Invalid input(s): "FREET3" Anemia work up: No results for input(s): "VITAMINB12", "FOLATE", "FERRITIN", "TIBC", "IRON", "RETICCTPCT" in the last 72 hours. Sepsis Labs: Recent Labs  Lab 11/14/22 0805 11/16/22 0400  WBC 7.9 8.5    Microbiology Recent Results (from the past 240 hour(s))  Surgical pcr screen     Status: None   Collection Time: 11/17/22  4:41 AM   Specimen: Nasal Mucosa; Nasal Swab  Result Value Ref Range Status   MRSA, PCR NEGATIVE NEGATIVE Final   Staphylococcus aureus NEGATIVE NEGATIVE Final    Comment: (NOTE) The Xpert SA Assay (FDA approved for NASAL specimens in patients 30 years of age and older), is one component of a comprehensive surveillance program. It is not intended to diagnose infection nor to guide or monitor treatment. Performed at [facility], [place] [address], [place], [place] [address]      Medications:    amoxicillin-clavulanate  1 tablet Oral Q12H   vitamin C  1,000 mg Oral Daily   docusate sodium  100 mg Oral Daily   feeding supplement (NEPRO CARB STEADY)  237 mL Oral Q24H   fentaNYL (SUBLIMAZE) injection  50 mcg Intravenous Once   fluticasone furoate-vilanterol  1 puff Inhalation Daily   folic acid  1 mg Oral Daily   heparin  5,000 Units Subcutaneous Q8H   magnesium oxide  400 mg Oral Daily   multivitamin with minerals  1 tablet Oral Daily   nutrition supplement (JUVEN)  1 packet Oral BID BM   pantoprazole  40 mg Oral Daily   polyethylene glycol  17 g Oral Daily   sodium hypochlorite   Irrigation BID   thiamine  100 mg Oral Daily   zinc sulfate  220 mg Oral Daily   Continuous Infusions:  sodium chloride Stopped (11/17/22 2037)    ceFAZolin (ANCEF) IV 2 g  (11/17/22 2042)   magnesium sulfate bolus IVPB        LOS: 34 days   Charlynne Cousins  [facility]  11/18/2022, 8:08 AM

## 2022-11-19 DIAGNOSIS — E871 Hypo-osmolality and hyponatremia: Secondary | ICD-10-CM | POA: Diagnosis not present

## 2022-11-19 DIAGNOSIS — J441 Chronic obstructive pulmonary disease with (acute) exacerbation: Secondary | ICD-10-CM | POA: Diagnosis not present

## 2022-11-19 DIAGNOSIS — J9601 Acute respiratory failure with hypoxia: Secondary | ICD-10-CM | POA: Diagnosis not present

## 2022-11-19 DIAGNOSIS — F1010 Alcohol abuse, uncomplicated: Secondary | ICD-10-CM | POA: Diagnosis not present

## 2022-11-19 LAB — SURGICAL PATHOLOGY

## 2022-11-19 MED ORDER — METOPROLOL TARTRATE 50 MG PO TABS
50.0000 mg | ORAL_TABLET | Freq: Two times a day (BID) | ORAL | Status: DC
  Administered 2022-11-19 – 2022-12-03 (×29): 50 mg via ORAL
  Filled 2022-11-19 (×28): qty 1

## 2022-11-19 NOTE — Progress Notes (Signed)
Patient allowed wound VAC dressing to be removed. Dry dressing applied to right knee.

## 2022-11-19 NOTE — Progress Notes (Signed)
Patient ID: Ronald [name], male   DOB: 1956/01/19, 67 y.o.   MRN: [id number] Patient is status post right transtibial amputation.  The wound VAC is alarming that it is clogged.  The canister has minimal drainage.  After manipulating the tube there was still a blockage most likely underneath the track pad.  Will have the wound VAC removed and start dry dressing changes as needed.

## 2022-11-19 NOTE — Progress Notes (Addendum)
Patient refused for RN to remove the wound VAC dressing. Patient requesting Dr. Sharol Given to remove the wound VAC dressing in the morning. Charge nurse aware. Will attempt dressing change again.

## 2022-11-19 NOTE — Progress Notes (Signed)
[facility] PROGRESS NOTE    Progress Note  [name]  [id number] DOB: 18-Apr-1956 DOA: 10/15/2022 PCP: Demetrios Isaacs, FNP     Brief Narrative:   Ronald [name] is an 67 y.o. male past medical history of polysubstance abuse including alcohol cocaine, noncompliance right healed unstageable pressure ulcer peripheral vascular disease, recently discharged from the hospital home as he refused SNF comes into the hospital for acute encephalopathy, hypoxic and hypercarbic noted to be aspirating, required intubation admitted to the ICU extubated on 10/16/2022 transferred to [facility] on 10/18/2022 currently awaiting skilled nursing facility placement.MRI of the foot show large ulceration and underlying osteomyelitis no abscess.   Assessment/Plan:   Unstageable right heel pressure ulcer with early osteomyelitis: Orthopedic surgery was consulted and performed amputation on 11/17/2022 with wound VAC placement. Due to surrounding cellulitis he was continue on Augmentin which we will probably continue for 2 weeks postsurgical intervention. PT OT evaluated the patient, will need skilled nursing facility placement.  Acute respiratory failure with hypoxia and hypercarbia: Now on 2 L try to wean to room air. KVO all IV  fluids patient is tolerating his diet.  Proteus Mirabella's pneumonia: He will finish his course of antibiotic Augmentin should cover.  Acute on chronic hyponatremia in the setting of beer potomania: Has returned to his baseline of 1 30-1 34.  Dysphagia: Tolerating a dysphagia 3 diet.  Electrolyte imbalance hypokalemia/hypomagnesemia: Monitor and replete as indicated.  Right jaw pain: CT scan showed no abscess.  Prediabetes mellitus type II: With an A1c of 5.6. Continue to monitor CBGs intermittently.  Chronic alcohol abuse/cocaine abuse: Continue folic acid thiamine and multivitamins no signs of withdrawal.  Peripheral vascular disease: History of left  BKA.  Right elbow swelling cellulitis versus thrombophlebitis: Noted.  Stage II ischial tuberosity pressure ulcer present on admission RN Pressure Injury Documentation: Pressure Injury 10/15/22 Ischial tuberosity Left;Posterior;Proximal Stage 2 -  Partial thickness loss of dermis presenting as a shallow open injury with a red, pink wound bed without slough. (Active)  10/15/22 2000  Location: Ischial tuberosity  Location Orientation: Left;Posterior;Proximal  Staging: Stage 2 -  Partial thickness loss of dermis presenting as a shallow open injury with a red, pink wound bed without slough.  Wound Description (Comments):   Present on Admission:   Dressing Type Foam - Lift dressing to assess site every shift 11/10/22 0900     Pressure Injury 10/15/22 Buttocks Mid;Upper Stage 1 -  Intact skin with non-blanchable redness of a localized area usually over a bony prominence. (Active)  10/15/22 1100  Location: Buttocks  Location Orientation: Mid;Upper  Staging: Stage 1 -  Intact skin with non-blanchable redness of a localized area usually over a bony prominence.  Wound Description (Comments):   Present on Admission:   Dressing Type Foam - Lift dressing to assess site every shift 11/18/22 1916     Pressure Injury 10/15/22 Heel Right Unstageable - Full thickness tissue loss in which the base of the injury is covered by slough (yellow, tan, gray, green or brown) and/or eschar (tan, brown or black) in the wound bed. (Active)  10/15/22 2000  Location: Heel  Location Orientation: Right  Staging: Unstageable - Full thickness tissue loss in which the base of the injury is covered by slough (yellow, tan, gray, green or brown) and/or eschar (tan, brown or black) in the wound bed.  Wound Description (Comments):   Present on Admission: Yes  Dressing Type Gauze (Comment) 11/17/22 0800    DVT prophylaxis: lovenox Family Communication:none  Status is: Inpatient Remains inpatient appropriate because:  Osteomyelitis    Code Status:     Code Status Orders  (From admission, onward)           Start     Ordered   10/15/22 0927  Full code  Continuous       Question:  By:  Answer:  Consent: discussion documented in EHR   10/15/22 0927           Code Status History     Date Active Date Inactive Code Status Order ID Comments User Context   09/19/2022 2106 09/30/2022 1914 Full Code [id number]  Etta Quill, DO ED   09/02/2022 1722 09/18/2022 0032 Full Code [id number]  Erick Colace, NP ED   04/15/2022 0038 04/16/2022 1817 Full Code [id number]  Etta Quill, DO ED   03/18/2019 0228 03/26/2019 2142 Full Code [id number]  Milagros Loll, MD ED   08/29/2017 1901 08/31/2017 2307 Full Code [id number]  Valinda Party, DO Inpatient   01/23/2017 0427 01/23/2017 1224 Full Code [id number]  Charlann Lange, PA-C ED   06/19/2014 0249 06/19/2014 1817 Full Code [id number]  Rise Patience, MD Inpatient   12/28/2013 1854 01/01/2014 2253 Full Code [id number]  Newt Minion, MD Inpatient   12/25/2013 1352 12/28/2013 1854 Full Code [id number]  Consuelo Pandy, PA-C Inpatient   11/06/2013 2025 11/10/2013 2336 Full Code [id number]  Sheila Oats, MD Inpatient   09/19/2013 0234 09/26/2013 2115 Full Code [id number]  Etta Quill, DO ED         IV Access:   Peripheral IV   Procedures and diagnostic studies:   No results found.   Medical Consultants:   None.   Subjective:    [name] no complaints of bowel movement today and yesterday  Objective:    Vitals:   11/18/22 1850 11/18/22 2009 11/19/22 0451 11/19/22 0746  BP: 126/77 124/78 118/80 112/75  Pulse: (!) 121 (!) 120 (!) 116 (!) 114  Resp:  '20 20 19  '$ Temp:    98.9 F (37.2 C)  TempSrc:    Oral  SpO2:  93% 96% 95%  Weight:      Height:       SpO2: 95 % O2 Flow Rate (L/min): 2 L/min FiO2 (%): 40 %  No intake or output data in the 24 hours ending 11/19/22 0825  Filed Weights   11/11/22 0500 11/12/22  0340 11/17/22 1317  Weight: 65.9 kg 68.9 kg 70 kg    Exam: General exam: In no acute distress. Respiratory system: Good air movement and clear to auscultation. Cardiovascular system: S1 & S2 heard, RRR. No JVD. Gastrointestinal system: Abdomen is nondistended, soft and nontender.  Extremities: No pedal edema. Skin: No rashes, lesions or ulcers Psychiatry: Judgement and insight appear normal. Mood & affect appropriate.   Data Reviewed:    Labs: Basic Metabolic Panel: Recent Labs  Lab 11/14/22 0805 11/16/22 0400  NA 134* 132*  K 4.0 4.0  CL 94* 96*  CO2 29 26  GLUCOSE 110* 112*  BUN 13 14  CREATININE 0.50* 0.52*  CALCIUM 9.7 9.5  MG 1.7 1.7    GFR Estimated Creatinine Clearance: 86.1 mL/min (A) (by C-G formula based on SCr of 0.52 mg/dL (L)). Liver Function Tests: Recent Labs  Lab 11/14/22 0805  AST 16  ALT 11  ALKPHOS 53  BILITOT 0.3  PROT 7.4  ALBUMIN 3.8    No results for input(s): "LIPASE", "AMYLASE" in the  last 168 hours. No results for input(s): "AMMONIA" in the last 168 hours. Coagulation profile No results for input(s): "INR", "PROTIME" in the last 168 hours. COVID-19 Labs  No results for input(s): "DDIMER", "FERRITIN", "LDH", "CRP" in the last 72 hours.  Lab Results  Component Value Date   SARSCOV2NAA NEGATIVE 10/15/2022   SARSCOV2NAA NEGATIVE 09/19/2022   [place] NEGATIVE 10/18/2020   [place] NEGATIVE 03/18/2019    CBC: Recent Labs  Lab 11/14/22 0805 11/16/22 0400  WBC 7.9 8.5  HGB 11.0* 10.5*  HCT 33.8* 31.0*  MCV 95.8 94.2  PLT 403* 320    Cardiac Enzymes: No results for input(s): "CKTOTAL", "CKMB", "CKMBINDEX", "TROPONINI" in the last 168 hours. BNP (last 3 results) No results for input(s): "PROBNP" in the last 8760 hours. CBG: No results for input(s): "GLUCAP" in the last 168 hours. D-Dimer: No results for input(s): "DDIMER" in the last 72 hours. Hgb A1c: No results for input(s): "HGBA1C" in the last 72  hours. Lipid Profile: No results for input(s): "CHOL", "HDL", "LDLCALC", "TRIG", "CHOLHDL", "LDLDIRECT" in the last 72 hours. Thyroid function studies: No results for input(s): "TSH", "T4TOTAL", "T3FREE", "THYROIDAB" in the last 72 hours.  Invalid input(s): "FREET3" Anemia work up: No results for input(s): "VITAMINB12", "FOLATE", "FERRITIN", "TIBC", "IRON", "RETICCTPCT" in the last 72 hours. Sepsis Labs: Recent Labs  Lab 11/14/22 0805 11/16/22 0400  WBC 7.9 8.5    Microbiology Recent Results (from the past 240 hour(s))  Surgical pcr screen     Status: None   Collection Time: 11/17/22  4:41 AM   Specimen: Nasal Mucosa; Nasal Swab  Result Value Ref Range Status   MRSA, PCR NEGATIVE NEGATIVE Final   Staphylococcus aureus NEGATIVE NEGATIVE Final    Comment: (NOTE) The Xpert SA Assay (FDA approved for NASAL specimens in patients 54 years of age and older), is one component of a comprehensive surveillance program. It is not intended to diagnose infection nor to guide or monitor treatment. Performed at [facility], [place] [address] [place], [place] [address]      Medications:    vitamin C  1,000 mg Oral Daily   docusate sodium  100 mg Oral Daily   feeding supplement (NEPRO CARB STEADY)  237 mL Oral Q24H   fentaNYL (SUBLIMAZE) injection  50 mcg Intravenous Once   fluticasone furoate-vilanterol  1 puff Inhalation Daily   folic acid  1 mg Oral Daily   heparin  5,000 Units Subcutaneous Q8H   magnesium oxide  400 mg Oral Daily   metoprolol tartrate  25 mg Oral BID   multivitamin with minerals  1 tablet Oral Daily   nutrition supplement (JUVEN)  1 packet Oral BID BM   pantoprazole  40 mg Oral Daily   polyethylene glycol  17 g Oral Daily   sodium hypochlorite   Irrigation BID   thiamine  100 mg Oral Daily   zinc sulfate  220 mg Oral Daily   Continuous Infusions:  sodium chloride Stopped (11/17/22 2037)   magnesium sulfate bolus IVPB        LOS: 35 days   Charlynne Cousins  [facility]  11/19/2022, 8:25 AM

## 2022-11-19 NOTE — Plan of Care (Signed)
  Problem: Education: Goal: Ability to describe self-care measures that may prevent or decrease complications (Diabetes Survival Skills Education) will improve Outcome: Progressing   Problem: Coping: Goal: Ability to adjust to condition or change in health will improve Outcome: Progressing   Problem: Fluid Volume: Goal: Ability to maintain a balanced intake and output will improve Outcome: Progressing   Problem: Health Behavior/Discharge Planning: Goal: Ability to identify and utilize available resources and services will improve Outcome: Progressing Goal: Ability to manage health-related needs will improve Outcome: Progressing   Problem: Metabolic: Goal: Ability to maintain appropriate glucose levels will improve Outcome: Progressing   Problem: Nutritional: Goal: Maintenance of adequate nutrition will improve Outcome: Progressing Goal: Progress toward achieving an optimal weight will improve Outcome: Progressing   Problem: Skin Integrity: Goal: Risk for impaired skin integrity will decrease Outcome: Progressing   Problem: Tissue Perfusion: Goal: Adequacy of tissue perfusion will improve Outcome: Progressing   Problem: Education: Goal: Knowledge of General Education information will improve Description: Including pain rating scale, medication(s)/side effects and non-pharmacologic comfort measures Outcome: Progressing   Problem: Health Behavior/Discharge Planning: Goal: Ability to manage health-related needs will improve Outcome: Progressing   Problem: Clinical Measurements: Goal: Ability to maintain clinical measurements within normal limits will improve Outcome: Progressing Goal: Will remain free from infection Outcome: Progressing Goal: Diagnostic test results will improve Outcome: Progressing Goal: Respiratory complications will improve Outcome: Progressing Goal: Cardiovascular complication will be avoided Outcome: Progressing   Problem: Activity: Goal:  Risk for activity intolerance will decrease Outcome: Progressing   Problem: Nutrition: Goal: Adequate nutrition will be maintained Outcome: Progressing   Problem: Coping: Goal: Level of anxiety will decrease Outcome: Progressing   Problem: Elimination: Goal: Will not experience complications related to bowel motility Outcome: Progressing Goal: Will not experience complications related to urinary retention Outcome: Progressing   Problem: Pain Managment: Goal: General experience of comfort will improve Outcome: Progressing   Problem: Safety: Goal: Ability to remain free from injury will improve Outcome: Progressing   Problem: Skin Integrity: Goal: Risk for impaired skin integrity will decrease Outcome: Progressing   Problem: Safety: Goal: Non-violent Restraint(s) Outcome: Progressing   Problem: Education: Goal: Knowledge of the prescribed therapeutic regimen will improve Outcome: Progressing Goal: Ability to verbalize activity precautions or restrictions will improve Outcome: Progressing Goal: Understanding of discharge needs will improve Outcome: Progressing   Problem: Activity: Goal: Ability to perform//tolerate increased activity and mobilize with assistive devices will improve Outcome: Progressing   Problem: Clinical Measurements: Goal: Postoperative complications will be avoided or minimized Outcome: Progressing   Problem: Self-Care: Goal: Ability to meet self-care needs will improve Outcome: Progressing   Problem: Self-Concept: Goal: Ability to maintain and perform role responsibilities to the fullest extent possible will improve Outcome: Progressing   Problem: Pain Management: Goal: Pain level will decrease with appropriate interventions Outcome: Progressing   Problem: Skin Integrity: Goal: Demonstration of wound healing without infection will improve Outcome: Progressing   

## 2022-11-19 NOTE — Progress Notes (Signed)
PT Cancellation Note  Patient Details Name: [name] MRN: [id number] DOB: 15-Nov-1955   Cancelled Treatment:    Reason Eval/Treat Not Completed: (P) Patient declined, no reason specified (Pt reported RLE pain as 9/10 and declined mobility. Will continue to follow per PT POC.)   Michelle Nasuti 11/19/2022, 3:18 PM

## 2022-11-19 NOTE — Plan of Care (Signed)
  Problem: Education: Goal: Ability to describe self-care measures that may prevent or decrease complications (Diabetes Survival Skills Education) will improve Outcome: Progressing   Problem: Coping: Goal: Ability to adjust to condition or change in health will improve Outcome: Progressing   Problem: Fluid Volume: Goal: Ability to maintain a balanced intake and output will improve Outcome: Progressing   Problem: Health Behavior/Discharge Planning: Goal: Ability to identify and utilize available resources and services will improve Outcome: Progressing Goal: Ability to manage health-related needs will improve Outcome: Progressing   Problem: Metabolic: Goal: Ability to maintain appropriate glucose levels will improve Outcome: Progressing   Problem: Nutritional: Goal: Maintenance of adequate nutrition will improve Outcome: Progressing Goal: Progress toward achieving an optimal weight will improve Outcome: Progressing   Problem: Skin Integrity: Goal: Risk for impaired skin integrity will decrease Outcome: Progressing   Problem: Tissue Perfusion: Goal: Adequacy of tissue perfusion will improve Outcome: Progressing   Problem: Education: Goal: Knowledge of General Education information will improve Description: Including pain rating scale, medication(s)/side effects and non-pharmacologic comfort measures Outcome: Progressing   Problem: Health Behavior/Discharge Planning: Goal: Ability to manage health-related needs will improve Outcome: Progressing   Problem: Clinical Measurements: Goal: Ability to maintain clinical measurements within normal limits will improve Outcome: Progressing Goal: Will remain free from infection Outcome: Progressing

## 2022-11-19 NOTE — TOC Progression Note (Signed)
Transition of Care [facility]) - Progression Note    Patient Details  Name: [name] MRN: [id number] Date of Birth: [birth year]  Transition of Care [facility]) CM/SW Contact  Joanne Chars, LCSW Phone Number: 11/19/2022, 3:09 PM  Clinical Narrative:   Pt continues to have no bed offers.     Expected Discharge Plan: [place] Barriers to Discharge: Continued Medical Work up  Expected Discharge Plan and Services In-house Referral: Clinical Social Work     Living arrangements for the past 2 months: Single Family Home                                       Social Determinants of Health (SDOH) Interventions SDOH Screenings   Food Insecurity: No Food Insecurity (10/17/2022)  Recent Concern: Food Insecurity - Food Insecurity Present (09/17/2022)  Housing: Medium Risk (10/17/2022)  Transportation Needs: Unmet Transportation Needs (10/17/2022)  Utilities: Not At Risk (10/17/2022)  Tobacco Use: High Risk (11/18/2022)    Readmission Risk Interventions     No data to display

## 2022-11-20 DIAGNOSIS — J9602 Acute respiratory failure with hypercapnia: Secondary | ICD-10-CM | POA: Diagnosis not present

## 2022-11-20 DIAGNOSIS — J9601 Acute respiratory failure with hypoxia: Secondary | ICD-10-CM | POA: Diagnosis not present

## 2022-11-20 MED ORDER — CEPHALEXIN 500 MG PO CAPS
500.0000 mg | ORAL_CAPSULE | Freq: Three times a day (TID) | ORAL | Status: DC
  Administered 2022-11-20 – 2022-12-02 (×37): 500 mg via ORAL
  Filled 2022-11-20 (×37): qty 1

## 2022-11-20 MED ORDER — AMOXICILLIN-POT CLAVULANATE 875-125 MG PO TABS: 1.0000 | ORAL_TABLET | Freq: Two times a day (BID) | ORAL | Status: DC

## 2022-11-20 NOTE — Progress Notes (Signed)
[facility] PROGRESS NOTE    Progress Note  [name]  [id number] DOB: 1956-08-24 DOA: 10/15/2022 PCP: Demetrios Isaacs, FNP     Brief Narrative:   Ronald [name] is an 67 y.o. male past medical history of polysubstance abuse including alcohol cocaine, noncompliance right healed unstageable pressure ulcer peripheral vascular disease, recently discharged from the hospital home as he refused SNF comes into the hospital for acute encephalopathy, hypoxic and hypercarbic noted to be aspirating, required intubation admitted to the ICU extubated on 10/16/2022 transferred to [facility] 10[facility]18[facility]2022 currently awaiting skilled nursing facility placement. MRI of the foot show large ulceration and underlying osteomyelitis no abscess.   Assessment/Plan:   Unstageable right heel pressure ulcer with early osteomyelitis: Orthopedic surgery was consulted and performed amputation on 11/17/2022, wound VAC now removed on wet-to-dry dressing changes. Now afebrile, cont keflex. PT OT evaluated the patient, will need skilled nursing facility placement.  Acute respiratory failure with hypoxia and hypercarbia: HAs been wean to room air. KVO all IV  fluids patient is tolerating his diet.  Proteus Mirabella's pneumonia: He will finish his course of antibiotic Augmentin should cover.  Acute on chronic hyponatremia in the setting of beer potomania: Has returned to his baseline of 130-1 34.  Dysphagia: Tolerating a dysphagia 3 diet.  Electrolyte imbalance hypokalemia/hypomagnesemia: Monitor and replete as indicated.  Right jaw pain: CT scan showed no abscess.  Prediabetes mellitus type II: With an A1c of 5.6. Continue to monitor CBGs intermittently.  Chronic alcohol abuse/cocaine abuse: Continue folic acid thiamine and multivitamins no signs of withdrawal.  Peripheral vascular disease: History of left BKA.  Right elbow swelling cellulitis versus thrombophlebitis: Noted.  Stage II ischial  tuberosity pressure ulcer present on admission RN Pressure Injury Documentation: Pressure Injury 10/15/22 Ischial tuberosity Left;Posterior;Proximal Stage 2 -  Partial thickness loss of dermis presenting as a shallow open injury with a red, pink wound bed without slough. (Active)  10/15/22 2000  Location: Ischial tuberosity  Location Orientation: Left;Posterior;Proximal  Staging: Stage 2 -  Partial thickness loss of dermis presenting as a shallow open injury with a red, pink wound bed without slough.  Wound Description (Comments):   Present on Admission:   Dressing Type Foam - Lift dressing to assess site every shift 11/10/22 0900     Pressure Injury 10/15/22 Buttocks Mid;Upper Stage 1 -  Intact skin with non-blanchable redness of a localized area usually over a bony prominence. (Active)  10/15/22 1100  Location: Buttocks  Location Orientation: Mid;Upper  Staging: Stage 1 -  Intact skin with non-blanchable redness of a localized area usually over a bony prominence.  Wound Description (Comments):   Present on Admission:   Dressing Type Foam - Lift dressing to assess site every shift 11/19/22 1937     Pressure Injury 10/15/22 Heel Right Unstageable - Full thickness tissue loss in which the base of the injury is covered by slough (yellow, tan, gray, green or brown) and/or eschar (tan, brown or black) in the wound bed. (Active)  10/15/22 2000  Location: Heel  Location Orientation: Right  Staging: Unstageable - Full thickness tissue loss in which the base of the injury is covered by slough (yellow, tan, gray, green or brown) and/or eschar (tan, brown or black) in the wound bed.  Wound Description (Comments):   Present on Admission: Yes  Dressing Type Gauze (Comment) 11/17/22 0800    DVT prophylaxis: lovenox Family Communication:none Status is: Inpatient Remains inpatient appropriate because: Osteomyelitis    Code Status:  Code Status Orders  (From admission, onward)            Start     Ordered   10/15/22 0927  Full code  Continuous       Question:  By:  Answer:  Consent: discussion documented in EHR   10/15/22 0927           Code Status History     Date Active Date Inactive Code Status Order ID Comments User Context   09/19/2022 2106 09/30/2022 1914 Full Code [id number]  Etta Quill, DO ED   09/02/2022 1722 09/18/2022 0032 Full Code [id number]  Erick Colace, NP ED   04/15/2022 0038 04/16/2022 1817 Full Code [id number]  Etta Quill, DO ED   03/18/2019 0228 03/26/2019 2142 Full Code [id number]  Milagros Loll, MD ED   08/29/2017 1901 08/31/2017 2307 Full Code [id number]  Valinda Party, DO Inpatient   01/23/2017 0427 01/23/2017 1224 Full Code [id number]  Charlann Lange, PA-C ED   06/19/2014 0249 06/19/2014 1817 Full Code [id number]  Rise Patience, MD Inpatient   12/28/2013 1854 01/01/2014 2253 Full Code [id number]  Newt Minion, MD Inpatient   12/25/2013 1352 12/28/2013 1854 Full Code [id number]  Consuelo Pandy, PA-C Inpatient   11/06/2013 2025 11/10/2013 2336 Full Code [id number]  Sheila Oats, MD Inpatient   09/19/2013 0234 09/26/2013 2115 Full Code [id number]  Etta Quill, DO ED         IV Access:   Peripheral IV   Procedures and diagnostic studies:   No results found.   Medical Consultants:   None.   Subjective:    [name] no complains  Objective:    Vitals:   11/19/22 2220 11/20/22 0447 11/20/22 0813 11/20/22 0844  BP: 115/71 116/68 124/71   Pulse: (!) 101 (!) 105 95   Resp: '19 18 19   '$ Temp: 98.9 F (37.2 C) 98.1 F (36.7 C) 98.6 F (37 C)   TempSrc: Oral Oral Oral   SpO2: 95% 97% 97% 96%  Weight:      Height:       SpO2: 96 % O2 Flow Rate (L/min): 2 L/min FiO2 (%): 40 %   Intake/Output Summary (Last 24 hours) at 11/20/2022 0845 Last data filed at 11/19/2022 2200 Gross per 24 hour  Intake 557.59 ml  Output 1300 ml  Net -742.41 ml    Filed Weights   11/11/22 0500 11/12/22 0340  11/17/22 1317  Weight: 65.9 kg 68.9 kg 70 kg    Exam: General exam: In no acute distress. Respiratory system: Good air movement and clear to auscultation. Cardiovascular system: S1 & S2 heard, RRR. No JVD. Gastrointestinal system: Abdomen is nondistended, soft and nontender.  Extremities: No pedal edema. Skin: No rashes, lesions or ulcers Psychiatry: Judgement and insight appear normal. Mood & affect appropriate.   Data Reviewed:    Labs: Basic Metabolic Panel: Recent Labs  Lab 11/14/22 0805 11/16/22 0400  NA 134* 132*  K 4.0 4.0  CL 94* 96*  CO2 29 26  GLUCOSE 110* 112*  BUN 13 14  CREATININE 0.50* 0.52*  CALCIUM 9.7 9.5  MG 1.7 1.7    GFR Estimated Creatinine Clearance: 86.1 mL/min (A) (by C-G formula based on SCr of 0.52 mg/dL (L)). Liver Function Tests: Recent Labs  Lab 11/14/22 0805  AST 16  ALT 11  ALKPHOS 53  BILITOT 0.3  PROT 7.4  ALBUMIN 3.8    No results for input(s): "LIPASE", "AMYLASE"  in the last 168 hours. No results for input(s): "AMMONIA" in the last 168 hours. Coagulation profile No results for input(s): "INR", "PROTIME" in the last 168 hours. COVID-19 Labs  No results for input(s): "DDIMER", "FERRITIN", "LDH", "CRP" in the last 72 hours.  Lab Results  Component Value Date   SARSCOV2NAA NEGATIVE 10/15/2022   SARSCOV2NAA NEGATIVE 09/19/2022   [place] NEGATIVE 10/18/2020   [place] NEGATIVE 03/18/2019    CBC: Recent Labs  Lab 11/14/22 0805 11/16/22 0400  WBC 7.9 8.5  HGB 11.0* 10.5*  HCT 33.8* 31.0*  MCV 95.8 94.2  PLT 403* 320    Cardiac Enzymes: No results for input(s): "CKTOTAL", "CKMB", "CKMBINDEX", "TROPONINI" in the last 168 hours. BNP (last 3 results) No results for input(s): "PROBNP" in the last 8760 hours. CBG: No results for input(s): "GLUCAP" in the last 168 hours. D-Dimer: No results for input(s): "DDIMER" in the last 72 hours. Hgb A1c: No results for input(s): "HGBA1C" in the last 72 hours. Lipid  Profile: No results for input(s): "CHOL", "HDL", "LDLCALC", "TRIG", "CHOLHDL", "LDLDIRECT" in the last 72 hours. Thyroid function studies: No results for input(s): "TSH", "T4TOTAL", "T3FREE", "THYROIDAB" in the last 72 hours.  Invalid input(s): "FREET3" Anemia work up: No results for input(s): "VITAMINB12", "FOLATE", "FERRITIN", "TIBC", "IRON", "RETICCTPCT" in the last 72 hours. Sepsis Labs: Recent Labs  Lab 11/14/22 0805 11/16/22 0400  WBC 7.9 8.5    Microbiology Recent Results (from the past 240 hour(s))  Surgical pcr screen     Status: None   Collection Time: 11/17/22  4:41 AM   Specimen: Nasal Mucosa; Nasal Swab  Result Value Ref Range Status   MRSA, PCR NEGATIVE NEGATIVE Final   Staphylococcus aureus NEGATIVE NEGATIVE Final    Comment: (NOTE) The Xpert SA Assay (FDA approved for NASAL specimens in patients 60 years of age and older), is one component of a comprehensive surveillance program. It is not intended to diagnose infection nor to guide or monitor treatment. Performed at [facility], [place] [address], [place], [place] [address]      Medications:    vitamin C  1,000 mg Oral Daily   docusate sodium  100 mg Oral Daily   feeding supplement (NEPRO CARB STEADY)  237 mL Oral Q24H   fentaNYL (SUBLIMAZE) injection  50 mcg Intravenous Once   fluticasone furoate-vilanterol  1 puff Inhalation Daily   folic acid  1 mg Oral Daily   heparin  5,000 Units Subcutaneous Q8H   magnesium oxide  400 mg Oral Daily   metoprolol tartrate  50 mg Oral BID   multivitamin with minerals  1 tablet Oral Daily   nutrition supplement (JUVEN)  1 packet Oral BID BM   pantoprazole  40 mg Oral Daily   polyethylene glycol  17 g Oral Daily   thiamine  100 mg Oral Daily   zinc sulfate  220 mg Oral Daily   Continuous Infusions:  sodium chloride 10 mL/hr at 11/19/22 1852   magnesium sulfate bolus IVPB        LOS: 36 days   Charlynne Cousins  [facility]  11/20/2022, 8:45  AM

## 2022-11-21 DIAGNOSIS — J9601 Acute respiratory failure with hypoxia: Secondary | ICD-10-CM | POA: Diagnosis not present

## 2022-11-21 DIAGNOSIS — J9602 Acute respiratory failure with hypercapnia: Secondary | ICD-10-CM | POA: Diagnosis not present

## 2022-11-21 NOTE — Progress Notes (Signed)
Patient ID: Rusty Aus, male   DOB: 1956-07-19, 67 y.o.   MRN: [id number]   Subjective: 4 Days Post-Op Procedure(s) (LRB): RIGHT BELOW KNEE AMPUTATION (Right) Patient reports pain as mild.    Objective: Vital signs in last 24 hours: Temp:  [97.8 F (36.6 C)-98.4 F (36.9 C)] 97.8 F (36.6 C) (03/10 0832) Pulse Rate:  [88-100] 91 (03/10 0832) Resp:  [16-18] 17 (03/10 0832) BP: (115-142)/(62-93) 127/79 (03/10 0832) SpO2:  [95 %-98 %] 96 % (03/10 0832) Weight:  [67.4 kg] 67.4 kg (03/10 0708)  Intake/Output from previous day: 03/09 0701 - 03/10 0700 In: 720 [P.O.:720] Out: 650 [Urine:650] Intake/Output this shift: Total I/O In: 340 [P.O.:340] Out: -   No results for input(s): "HGB" in the last 72 hours. No results for input(s): "WBC", "RBC", "HCT", "PLT" in the last 72 hours. No results for input(s): "NA", "K", "CL", "CO2", "BUN", "CREATININE", "GLUCOSE", "CALCIUM" in the last 72 hours. No results for input(s): "LABPT", "INR" in the last 72 hours.  Neurologically intact  keflex dressing dry.  No results found.  Assessment/Plan: 4 Days Post-Op Procedure(s) (LRB): RIGHT BELOW KNEE AMPUTATION (Right) Plan:  SNF  Marybelle Killings 11/21/2022, 1:15 PM

## 2022-11-21 NOTE — Progress Notes (Signed)
[facility] PROGRESS NOTE    Progress Note  [name]  [id number] DOB: [birth year] DOA: 10/15/2022 PCP: Demetrios Isaacs, FNP     Brief Narrative:   Ronald [name] is an 67 y.o. male past medical history of polysubstance abuse including alcohol cocaine, noncompliance right healed unstageable pressure ulcer peripheral vascular disease, recently discharged from the hospital home as he refused SNF comes into the hospital for acute encephalopathy, hypoxic and hypercarbic noted to be aspirating, required intubation admitted to the ICU extubated on 10/16/2022 transferred to [facility] 10/18/2022 currently awaiting skilled nursing facility placement. MRI of the foot show large ulceration and underlying osteomyelitis no abscess.   Assessment/Plan:   Unstageable right heel pressure ulcer with early osteomyelitis: Orthopedic surgery was consulted and performed amputation on 11/17/2022, wound VAC now removed on wet-to-dry dressing changes. Now afebrile, cont keflex. PT OT evaluated the patient, awaiting skilled nursing facility placement.  Acute respiratory failure with hypoxia and hypercarbia: HAs been wean to room air. KVO all IV  fluids patient is tolerating his diet.  Proteus Mirabella's pneumonia: He will finish his course of antibiotic Augmentin should cover.  Acute on chronic hyponatremia in the setting of beer potomania: Has returned to his baseline of 130-1 34.  Dysphagia: Tolerating a dysphagia 3 diet.  Electrolyte imbalance hypokalemia/hypomagnesemia: Monitor and replete as indicated.  Right jaw pain: CT scan showed no abscess.  Prediabetes mellitus type II: With an A1c of 5.6. Continue to monitor CBGs intermittently.  Chronic alcohol abuse/cocaine abuse: Continue folic acid thiamine and multivitamins no signs of withdrawal.  Peripheral vascular disease: History of left BKA.  Right elbow swelling cellulitis versus thrombophlebitis: Noted.  Stage II ischial  tuberosity pressure ulcer present on admission RN Pressure Injury Documentation: Pressure Injury 10/15/22 Ischial tuberosity Left;Posterior;Proximal Stage 2 -  Partial thickness loss of dermis presenting as a shallow open injury with a red, pink wound bed without slough. (Active)  10/15/22 2000  Location: Ischial tuberosity  Location Orientation: Left;Posterior;Proximal  Staging: Stage 2 -  Partial thickness loss of dermis presenting as a shallow open injury with a red, pink wound bed without slough.  Wound Description (Comments):   Present on Admission:   Dressing Type Foam - Lift dressing to assess site every shift 11/10/22 0900     Pressure Injury 10/15/22 Buttocks Mid;Upper Stage 1 -  Intact skin with non-blanchable redness of a localized area usually over a bony prominence. (Active)  10/15/22 1100  Location: Buttocks  Location Orientation: Mid;Upper  Staging: Stage 1 -  Intact skin with non-blanchable redness of a localized area usually over a bony prominence.  Wound Description (Comments):   Present on Admission:   Dressing Type Foam - Lift dressing to assess site every shift 11/20/22 1015    DVT prophylaxis: lovenox Family Communication:none Status is: Inpatient Remains inpatient appropriate because: Osteomyelitis    Code Status:     Code Status Orders  (From admission, onward)           Start     Ordered   10/15/22 0927  Full code  Continuous       Question:  By:  Answer:  Consent: discussion documented in EHR   10/15/22 0927           Code Status History     Date Active Date Inactive Code Status Order ID Comments User Context   09/19/2022 2106 09/30/2022 1914 Full Code [id number]  Etta Quill, DO ED   09/02/2022 1722 09/18/2022 0032 Full Code  [id number]  Erick Colace, NP ED   04/15/2022 0038 04/16/2022 1817 Full Code [id number]  Etta Quill, DO ED   03/18/2019 0228 03/26/2019 2142 Full Code [id number]  Milagros Loll, MD ED   08/29/2017 1901 08/31/2017  2307 Full Code [id number]  Valinda Party, DO Inpatient   01/23/2017 0427 01/23/2017 1224 Full Code [id number]  Charlann Lange, PA-C ED   06/19/2014 0249 06/19/2014 1817 Full Code [id number]  Rise Patience, MD Inpatient   12/28/2013 1854 01/01/2014 2253 Full Code [id number]  Newt Minion, MD Inpatient   12/25/2013 1352 12/28/2013 1854 Full Code [id number]  Consuelo Pandy, PA-C Inpatient   11/06/2013 2025 11/10/2013 2336 Full Code [id number]  Sheila Oats, MD Inpatient   09/19/2013 0234 09/26/2013 2115 Full Code [id number]  Etta Quill, DO ED         IV Access:   Peripheral IV   Procedures and diagnostic studies:   No results found.   Medical Consultants:   None.   Subjective:    [name] no complains  Objective:    Vitals:   11/20/22 1553 11/20/22 2225 11/21/22 0423 11/21/22 0708  BP: 115/62 128/80 (!) 142/93   Pulse: 89 100 88   Resp: '18 16 16   '$ Temp: 98 F (36.7 C) 98 F (36.7 C) 98.4 F (36.9 C)   TempSrc: Oral  Oral   SpO2: 98% 95% 96%   Weight:    67.4 kg  Height:       SpO2: 96 % O2 Flow Rate (L/min): 2 L/min FiO2 (%): 40 %   Intake/Output Summary (Last 24 hours) at 11/21/2022 0813 Last data filed at 11/21/2022 0800 Gross per 24 hour  Intake 1060 ml  Output 650 ml  Net 410 ml    Filed Weights   11/12/22 0340 11/17/22 1317 11/21/22 0708  Weight: 68.9 kg 70 kg 67.4 kg    Exam: General exam: In no acute distress. Respiratory system: Good air movement and clear to auscultation. Cardiovascular system: S1 & S2 heard, RRR. No JVD. Gastrointestinal system: Abdomen is nondistended, soft and nontender.  Extremities: No pedal edema. Skin: No rashes, lesions or ulcers Psychiatry: Judgement and insight appear normal. Mood & affect appropriate.   Data Reviewed:    Labs: Basic Metabolic Panel: Recent Labs  Lab 11/14/22 0805 11/16/22 0400  NA 134* 132*  K 4.0 4.0  CL 94* 96*  CO2 29 26  GLUCOSE 110* 112*  BUN 13 14   CREATININE 0.50* 0.52*  CALCIUM 9.7 9.5  MG 1.7 1.7    GFR Estimated Creatinine Clearance: 86.1 mL/min (A) (by C-G formula based on SCr of 0.52 mg/dL (L)). Liver Function Tests: Recent Labs  Lab 11/14/22 0805  AST 16  ALT 11  ALKPHOS 53  BILITOT 0.3  PROT 7.4  ALBUMIN 3.8    No results for input(s): "LIPASE", "AMYLASE" in the last 168 hours. No results for input(s): "AMMONIA" in the last 168 hours. Coagulation profile No results for input(s): "INR", "PROTIME" in the last 168 hours. COVID-19 Labs  No results for input(s): "DDIMER", "FERRITIN", "LDH", "CRP" in the last 72 hours.  Lab Results  Component Value Date   SARSCOV2NAA NEGATIVE 10/15/2022   SARSCOV2NAA NEGATIVE 09/19/2022   [place] NEGATIVE 10/18/2020   [place] NEGATIVE 03/18/2019    CBC: Recent Labs  Lab 11/14/22 0805 11/16/22 0400  WBC 7.9 8.5  HGB 11.0* 10.5*  HCT 33.8* 31.0*  MCV 95.8 94.2  PLT 403* 320  Cardiac Enzymes: No results for input(s): "CKTOTAL", "CKMB", "CKMBINDEX", "TROPONINI" in the last 168 hours. BNP (last 3 results) No results for input(s): "PROBNP" in the last 8760 hours. CBG: No results for input(s): "GLUCAP" in the last 168 hours. D-Dimer: No results for input(s): "DDIMER" in the last 72 hours. Hgb A1c: No results for input(s): "HGBA1C" in the last 72 hours. Lipid Profile: No results for input(s): "CHOL", "HDL", "LDLCALC", "TRIG", "CHOLHDL", "LDLDIRECT" in the last 72 hours. Thyroid function studies: No results for input(s): "TSH", "T4TOTAL", "T3FREE", "THYROIDAB" in the last 72 hours.  Invalid input(s): "FREET3" Anemia work up: No results for input(s): "VITAMINB12", "FOLATE", "FERRITIN", "TIBC", "IRON", "RETICCTPCT" in the last 72 hours. Sepsis Labs: Recent Labs  Lab 11/14/22 0805 11/16/22 0400  WBC 7.9 8.5    Microbiology Recent Results (from the past 240 hour(s))  Surgical pcr screen     Status: None   Collection Time: 11/17/22  4:41 AM    Specimen: Nasal Mucosa; Nasal Swab  Result Value Ref Range Status   MRSA, PCR NEGATIVE NEGATIVE Final   Staphylococcus aureus NEGATIVE NEGATIVE Final    Comment: (NOTE) The Xpert SA Assay (FDA approved for NASAL specimens in patients 13 years of age and older), is one component of a comprehensive surveillance program. It is not intended to diagnose infection nor to guide or monitor treatment. Performed at [facility], [address], [place], [place] [address]      Medications:    vitamin C  1,000 mg Oral Daily   cephALEXin  500 mg Oral Q8H   docusate sodium  100 mg Oral Daily   feeding supplement (NEPRO CARB STEADY)  237 mL Oral Q24H   fentaNYL (SUBLIMAZE) injection  50 mcg Intravenous Once   fluticasone furoate-vilanterol  1 puff Inhalation Daily   folic acid  1 mg Oral Daily   heparin  5,000 Units Subcutaneous Q8H   magnesium oxide  400 mg Oral Daily   metoprolol tartrate  50 mg Oral BID   multivitamin with minerals  1 tablet Oral Daily   nutrition supplement (JUVEN)  1 packet Oral BID BM   pantoprazole  40 mg Oral Daily   polyethylene glycol  17 g Oral Daily   thiamine  100 mg Oral Daily   zinc sulfate  220 mg Oral Daily   Continuous Infusions:  sodium chloride 10 mL/hr at 11/19/22 1852   magnesium sulfate bolus IVPB        LOS: 37 days   Charlynne Cousins  [facility]  11/21/2022, 8:13 AM

## 2022-11-22 DIAGNOSIS — J1569 Pneumonia due to other gram-negative bacteria: Secondary | ICD-10-CM | POA: Diagnosis not present

## 2022-11-22 DIAGNOSIS — J9602 Acute respiratory failure with hypercapnia: Secondary | ICD-10-CM | POA: Diagnosis not present

## 2022-11-22 DIAGNOSIS — J9601 Acute respiratory failure with hypoxia: Secondary | ICD-10-CM | POA: Diagnosis not present

## 2022-11-22 NOTE — Progress Notes (Signed)
Physical Therapy Treatment Patient Details Name: Ronald [name] MRN: [id number] DOB: [birth year] Today's Date: 11/22/2022   History of Present Illness This is a 67 year old gentleman admitted 2/2 after being found down at home after a recent d/c from hospital where he refused SNF. Pt was hyponatremic hypoxemic hypercarbic was placed on BiPAP in the emergency room. Ultimately failed BiPAP requiring intubation mechanical ventilation 2/2--2/3. 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 PMH: polysubstance abuse, cocaine, alcohol use.    PT Comments    Tolerated majority of session well. Sat EOB x 10 min and participated in LE exercises. Education for mobility, precautions, and Rt knee extension to prevent further contracture. Pt emotionally labile, quickly becomes agitated and screams at staff. Today triggered by recommendation for repositioning RLE in extension using either amputee splint vs pillow/towels. Refused OOB mobility, transfer training, and further education for post-op amputee training. Unable to reasonably counsel. Patient will continue to benefit from skilled physical therapy services to further improve independence with functional mobility.   Recommendations for follow up therapy are one component of a multi-disciplinary discharge planning process, led by the attending physician.  Recommendations may be updated based on patient status, additional functional criteria and insurance authorization.  Follow Up Recommendations  Skilled nursing-short term rehab (<3 hours/day) Can patient physically be transported by private vehicle: No   Assistance Recommended at Discharge Frequent or constant Supervision/Assistance  Patient can return home with the following A lot of help with bathing/dressing/bathroom;Assistance with cooking/housework;Direct supervision/assist for medications management;Direct supervision/assist for financial management;Assist for transportation;Help with stairs or ramp for entrance;Two people to help  with walking and/or transfers   Equipment Recommendations  Wheelchair (measurements PT);Wheelchair cushion (measurements PT)    Recommendations for Other Services OT consult     Precautions / Restrictions Precautions Precautions: Fall Precaution Comments: 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) . New Rt BKA Required Braces or Orthoses:  (Amputee splint - Refuses) Restrictions Weight Bearing Restrictions: Yes RLE Weight Bearing: Non weight bearing     Mobility  Bed Mobility Overal bed mobility: Needs Assistance Bed Mobility: Supine to Sit, Sit to Supine     Supine to sit: HOB elevated, Min guard Sit to supine: Supervision   General bed mobility comments: Able to rise to EOB with min guard for safety, some posterior lean but able to self correct. Supervision for safety to return to supine. No physical assist required.    Transfers                   General transfer comment: Refused    Ambulation/Gait               General Gait Details: N/a   Stairs             Wheelchair Mobility    Modified Rankin (Stroke Patients Only)       Balance Overall balance assessment: Needs assistance Sitting-balance support: Single extremity supported Sitting balance-Leahy Scale: Poor Sitting balance - Comments: Sat EOB >10 min with intermtitent UE support to stabilize, no assist from therapist. Postural control: Posterior lean                                  Cognition Arousal/Alertness: Awake/alert Behavior During Therapy: Agitated Overall Cognitive Status: No family/caregiver present to determine baseline cognitive functioning  General Comments: Emotionally labile - was appropriate for majority of session however at end of session when discussing repositioning for RLE protection and extension pt became severely agitated and screams at staff.        Exercises Amputee Exercises Quad Sets: AROM,  Both, 10 reps, Supine Towel Squeeze: Strengthening, Both, 10 reps, Seated Hip ABduction/ADduction: Both, 10 reps, Strengthening, Seated (manual resistance) Knee Flexion: AROM, Both, 10 reps, Seated Knee Extension: Strengthening, Both, 10 reps, Seated Straight Leg Raises: AROM, Left, 10 reps, Supine Other Exercises Other Exercises: Passive hamstring stretch Rt knee extension 30" x2    General Comments General comments (skin integrity, edema, etc.): Refused transfer training, OOB, and amputee splint. Becomes labile and very agitated; unable to counsel, poor insight.      Pertinent Vitals/Pain Pain Assessment Pain Assessment: Faces Faces Pain Scale: Hurts little more Pain Location: Rt residual limb Pain Descriptors / Indicators: Guarding Pain Intervention(s): Monitored during session, Repositioned    Home Living                          Prior Function            PT Goals (current goals can now be found in the care plan section) Acute Rehab PT Goals Patient Stated Goal: none stated PT Goal Formulation: With patient Time For Goal Achievement: 12/02/22 Potential to Achieve Goals: Fair Progress towards PT goals: Progressing toward goals    Frequency    Min 3X/week      PT Plan Current plan remains appropriate    Co-evaluation              AM-PAC PT "6 Clicks" Mobility   Outcome Measure  Help needed turning from your back to your side while in a flat bed without using bedrails?: A Little Help needed moving from lying on your back to sitting on the side of a flat bed without using bedrails?: A Little Help needed moving to and from a bed to a chair (including a wheelchair)?: Total Help needed standing up from a chair using your arms (e.g., wheelchair or bedside chair)?: Total Help needed to walk in hospital room?: Total Help needed climbing 3-5 steps with a railing? : Total 6 Click Score: 10    End of Session   Activity Tolerance: Treatment limited  secondary to agitation Patient left: with call bell/phone within reach;in bed;with bed alarm set Nurse Communication: Mobility status PT Visit Diagnosis: Other abnormalities of gait and mobility (R26.89);Muscle weakness (generalized) (M62.81);History of falling (Z91.81);Pain;Difficulty in walking, not elsewhere classified (R26.2) Pain - Right/Left: Right Pain - part of body: Leg     Time: [id number] PT Time Calculation (min) (ACUTE ONLY): 15 min  Charges:  $Therapeutic Exercise: 8-22 mins                     Candie Mile, PT, DPT Physical Therapist [facility] [place]    Ellouise Newer 11/22/2022, 12:38 PM

## 2022-11-22 NOTE — Progress Notes (Signed)
[facility] PROGRESS NOTE    Progress Note  [name]  [id number] DOB: December 17, 1955 DOA: 10/15/2022 PCP: Demetrios Isaacs, FNP     Brief Narrative:   Ronald [name] is an 67 y.o. male past medical history of polysubstance abuse including alcohol cocaine, noncompliance right healed unstageable pressure ulcer peripheral vascular disease, recently discharged from the hospital home as he refused SNF comes into the hospital for acute encephalopathy, hypoxic and hypercarbic noted to be aspirating, required intubation admitted to the ICU extubated on 10/16/2022 transferred to [facility] 10/18/2022 currently awaiting skilled nursing facility placement. MRI of the foot show large ulceration and underlying osteomyelitis no abscess.   Assessment/Plan:   Unstageable right heel pressure ulcer with early osteomyelitis: Orthopedic surgery was consulted and performed amputation on 11/17/2022, wound VAC now removed on wet-to-dry dressing changes. Now afebrile, cont keflex. PT OT evaluated the patient, awaiting skilled nursing facility placement.  Acute respiratory failure with hypoxia and hypercarbia: Has been wean to room air. KVO all IV  fluids patient is tolerating his diet.  Proteus Mirabella's pneumonia: He will finish his course of antibiotic Augmentin should cover.  Acute on chronic hyponatremia in the setting of beer potomania: Has returned to his baseline of 130-1 34.  Dysphagia: Tolerating a dysphagia 3 diet.  Electrolyte imbalance hypokalemia/hypomagnesemia: Monitor and replete as indicated.  Right jaw pain: CT scan showed no abscess.  Prediabetes mellitus type II: With an A1c of 5.6. Continue to monitor CBGs intermittently.  Chronic alcohol abuse/cocaine abuse: Continue folic acid thiamine and multivitamins no signs of withdrawal.  Peripheral vascular disease: History of left BKA.  Right elbow swelling cellulitis versus thrombophlebitis: Noted.  Stage II ischial  tuberosity pressure ulcer present on admission RN Pressure Injury Documentation: Pressure Injury 10/15/22 Ischial tuberosity Left;Posterior;Proximal Stage 2 -  Partial thickness loss of dermis presenting as a shallow open injury with a red, pink wound bed without slough. (Active)  10/15/22 2000  Location: Ischial tuberosity  Location Orientation: Left;Posterior;Proximal  Staging: Stage 2 -  Partial thickness loss of dermis presenting as a shallow open injury with a red, pink wound bed without slough.  Wound Description (Comments):   Present on Admission:   Dressing Type Foam - Lift dressing to assess site every shift 11/10/22 0900     Pressure Injury 10/15/22 Buttocks Mid;Upper Stage 1 -  Intact skin with non-blanchable redness of a localized area usually over a bony prominence. (Active)  10/15/22 1100  Location: Buttocks  Location Orientation: Mid;Upper  Staging: Stage 1 -  Intact skin with non-blanchable redness of a localized area usually over a bony prominence.  Wound Description (Comments):   Present on Admission:   Dressing Type Foam - Lift dressing to assess site every shift 11/21/22 0800    DVT prophylaxis: lovenox Family Communication:none Status is: Inpatient Remains inpatient appropriate because: Osteomyelitis    Code Status:     Code Status Orders  (From admission, onward)           Start     Ordered   10/15/22 0927  Full code  Continuous       Question:  By:  Answer:  Consent: discussion documented in EHR   10/15/22 0927           Code Status History     Date Active Date Inactive Code Status Order ID Comments User Context   09/19/2022 2106 09/30/2022 1914 Full Code [id number]  Etta Quill, DO ED   09/02/2022 1722 09/18/2022 0032 Full Code  [id number]  Erick Colace, NP ED   04/15/2022 0038 04/16/2022 1817 Full Code [id number]  Etta Quill, DO ED   03/18/2019 0228 03/26/2019 2142 Full Code [id number]  Milagros Loll, MD ED   08/29/2017 1901 08/31/2017  2307 Full Code [id number]  Valinda Party, DO Inpatient   01/23/2017 0427 01/23/2017 1224 Full Code [id number]  Charlann Lange, PA-C ED   06/19/2014 0249 06/19/2014 1817 Full Code [id number]  Rise Patience, MD Inpatient   12/28/2013 1854 01/01/2014 2253 Full Code [id number]  Newt Minion, MD Inpatient   12/25/2013 1352 12/28/2013 1854 Full Code [id number]  Consuelo Pandy, PA-C Inpatient   11/06/2013 2025 11/10/2013 2336 Full Code [id number]  Sheila Oats, MD Inpatient   09/19/2013 0234 09/26/2013 2115 Full Code [id number]  Etta Quill, DO ED         IV Access:   Peripheral IV   Procedures and diagnostic studies:   No results found.   Medical Consultants:   None.   Subjective:    [facility] no complains  Objective:    Vitals:   11/21/22 0832 11/21/22 1709 11/21/22 2218 11/22/22 0800  BP: 127/79 (!) 150/78 (!) 142/86 121/73  Pulse: 91 95 98 93  Resp: '17 15 18 16  '$ Temp: 97.8 F (36.6 C) 98 F (36.7 C) 98.1 F (36.7 C) 98.1 F (36.7 C)  TempSrc:  Oral Oral Oral  SpO2: 96% 97% 98% 99%  Weight:      Height:       SpO2: 99 % O2 Flow Rate (L/min): 2 L/min FiO2 (%): 40 %   Intake/Output Summary (Last 24 hours) at 11/22/2022 0936 Last data filed at 11/22/2022 0700 Gross per 24 hour  Intake 1080 ml  Output 4450 ml  Net -3370 ml    Filed Weights   11/12/22 0340 11/17/22 1317 11/21/22 0708  Weight: 68.9 kg 70 kg 67.4 kg    Exam: General exam: In no acute distress. Respiratory system: Good air movement and clear to auscultation. Cardiovascular system: S1 & S2 heard, RRR. No JVD. Gastrointestinal system: Abdomen is nondistended, soft and nontender.  Extremities: No pedal edema. Skin: No rashes, lesions or ulcers Psychiatry: Judgement and insight appear normal. Mood & affect appropriate.   Data Reviewed:    Labs: Basic Metabolic Panel: Recent Labs  Lab 11/16/22 0400  NA 132*  K 4.0  CL 96*  CO2 26  GLUCOSE 112*  BUN 14   CREATININE 0.52*  CALCIUM 9.5  MG 1.7    GFR Estimated Creatinine Clearance: 86.1 mL/min (A) (by C-G formula based on SCr of 0.52 mg/dL (L)). Liver Function Tests: No results for input(s): "AST", "ALT", "ALKPHOS", "BILITOT", "PROT", "ALBUMIN" in the last 168 hours.  No results for input(s): "LIPASE", "AMYLASE" in the last 168 hours. No results for input(s): "AMMONIA" in the last 168 hours. Coagulation profile No results for input(s): "INR", "PROTIME" in the last 168 hours. COVID-19 Labs  No results for input(s): "DDIMER", "FERRITIN", "LDH", "CRP" in the last 72 hours.  Lab Results  Component Value Date   SARSCOV2NAA NEGATIVE 10/15/2022   SARSCOV2NAA NEGATIVE 09/19/2022   [place] NEGATIVE 10/18/2020   [place] NEGATIVE 03/18/2019    CBC: Recent Labs  Lab 11/16/22 0400  WBC 8.5  HGB 10.5*  HCT 31.0*  MCV 94.2  PLT 320    Cardiac Enzymes: No results for input(s): "CKTOTAL", "CKMB", "CKMBINDEX", "TROPONINI" in the last 168 hours. BNP (last 3 results) No results for input(s): "PROBNP"  in the last 8760 hours. CBG: No results for input(s): "GLUCAP" in the last 168 hours. D-Dimer: No results for input(s): "DDIMER" in the last 72 hours. Hgb A1c: No results for input(s): "HGBA1C" in the last 72 hours. Lipid Profile: No results for input(s): "CHOL", "HDL", "LDLCALC", "TRIG", "CHOLHDL", "LDLDIRECT" in the last 72 hours. Thyroid function studies: No results for input(s): "TSH", "T4TOTAL", "T3FREE", "THYROIDAB" in the last 72 hours.  Invalid input(s): "FREET3" Anemia work up: No results for input(s): "VITAMINB12", "FOLATE", "FERRITIN", "TIBC", "IRON", "RETICCTPCT" in the last 72 hours. Sepsis Labs: Recent Labs  Lab 11/16/22 0400  WBC 8.5    Microbiology Recent Results (from the past 240 hour(s))  Surgical pcr screen     Status: None   Collection Time: 11/17/22  4:41 AM   Specimen: Nasal Mucosa; Nasal Swab  Result Value Ref Range Status   MRSA, PCR  NEGATIVE NEGATIVE Final   Staphylococcus aureus NEGATIVE NEGATIVE Final    Comment: (NOTE) The Xpert SA Assay (FDA approved for NASAL specimens in patients 43 years of age and older), is one component of a comprehensive surveillance program. It is not intended to diagnose infection nor to guide or monitor treatment. Performed at [facility], [place] [address], [place], [place] [address]      Medications:    vitamin C  1,000 mg Oral Daily   cephALEXin  500 mg Oral Q8H   docusate sodium  100 mg Oral Daily   feeding supplement (NEPRO CARB STEADY)  237 mL Oral Q24H   fentaNYL (SUBLIMAZE) injection  50 mcg Intravenous Once   fluticasone furoate-vilanterol  1 puff Inhalation Daily   folic acid  1 mg Oral Daily   heparin  5,000 Units Subcutaneous Q8H   magnesium oxide  400 mg Oral Daily   metoprolol tartrate  50 mg Oral BID   multivitamin with minerals  1 tablet Oral Daily   nutrition supplement (JUVEN)  1 packet Oral BID BM   pantoprazole  40 mg Oral Daily   polyethylene glycol  17 g Oral Daily   thiamine  100 mg Oral Daily   zinc sulfate  220 mg Oral Daily   Continuous Infusions:  sodium chloride 10 mL/hr at 11/19/22 1852   magnesium sulfate bolus IVPB        LOS: 38 days   Charlynne Cousins  [facility]  11/22/2022, 9:36 AM

## 2022-11-23 DIAGNOSIS — J9602 Acute respiratory failure with hypercapnia: Secondary | ICD-10-CM | POA: Diagnosis not present

## 2022-11-23 DIAGNOSIS — J1569 Pneumonia due to other gram-negative bacteria: Secondary | ICD-10-CM | POA: Diagnosis not present

## 2022-11-23 DIAGNOSIS — J9601 Acute respiratory failure with hypoxia: Secondary | ICD-10-CM | POA: Diagnosis not present

## 2022-11-23 NOTE — Progress Notes (Addendum)
Occupational Therapy Treatment Patient Details Name: Ronald [name] MRN: [id number] DOB: 10-13-1955 Today's Date: 11/23/2022   History of present illness This is a 67 year old gentleman admitted 2/2 after being found down at home after a recent d/c from hospital where he refused SNF. Pt was hyponatremic hypoxemic hypercarbic was placed on BiPAP in the emergency room. Ultimately failed BiPAP requiring intubation mechanical ventilation 2/2--2/3. 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 PMH: polysubstance abuse, cocaine, alcohol use.   OT comments  Pt actively participated in skilled therapy with 3-4 bursts of agitation with requests for tasks that would cause RLE pain, required increased verbal cues for motivation and active listening to encourage understanding of increased pain level in RLE during activities for active participation.  Pt had difficulty with t/f x 1 from EOB to chair due to BUE weakness and pain while attempting lateral scooting, Pt agreeable to attempt sliding board t/f next session. Continue current DC recommendation of SNF to improve function and carryover of learned skills with DME training. Pt would benefit greatly from continued skilled OT to improve function, prevent contractures, with education on HEP and sliding board use, and to improve overall functional independence.    Recommendations for follow up therapy are one component of a multi-disciplinary discharge planning process, led by the attending physician.  Recommendations may be updated based on patient status, additional functional criteria and insurance authorization.    Follow Up Recommendations  Skilled nursing-short term rehab (<3 hours/day)     Assistance Recommended at Discharge Frequent or constant Supervision/Assistance  Patient can return home with the following  A little help with walking and/or transfers;A little help with bathing/dressing/bathroom;Assistance with cooking/housework;Direct supervision/assist for financial  management;Direct supervision/assist for medications management;Help with stairs or ramp for entrance   Equipment Recommendations  Other (comment) (sliding board)    Recommendations for Other Services      Precautions / Restrictions Precautions Precautions: Fall Precaution Comments: 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) . New Rt BKA Restrictions Weight Bearing Restrictions: Yes RLE Weight Bearing: Non weight bearing       Mobility Bed Mobility Overal bed mobility: Needs Assistance Bed Mobility: Supine to Sit, Sit to Supine Rolling: Supervision   Supine to sit: HOB elevated, Supervision Sit to supine: Max assist   General bed mobility comments: Pt. has difficulty with sit to supine and scooting to center of bed. Pt able to roll WFLs, but due to pain at RLE and decreased BUE strength Pt requires assistnace with lateral scooting    Transfers Overall transfer level: Needs assistance Equipment used: None Transfers: Bed to chair/wheelchair/BSC             General transfer comment: Attempted, unable to with x1 assistance, spoke to Pt about attempting with sliding board, Pt agreeable next session.     Balance Overall balance assessment: Needs assistance   Sitting balance-Leahy Scale: Fair                                     ADL either performed or assessed with clinical judgement   ADL Overall ADL's : Needs assistance/impaired Eating/Feeding: Independent Eating/Feeding Details (indicate cue type and reason): able to compelte BL tasks while eating, rolling/leaning in bed to reach food on side table WFLs Grooming: Set up;Sitting           Upper Body Dressing : Set up;Sitting   Lower Body Dressing: Moderate assistance;Bed level   Toilet Transfer: +  2 for physical assistance;Moderate assistance                  Extremity/Trunk Assessment Upper Extremity Assessment Upper Extremity Assessment: Generalized weakness            Vision        Perception     Praxis      Cognition Arousal/Alertness: Awake/alert Behavior During Therapy: Agitated Overall Cognitive Status: Within Functional Limits for tasks assessed                                 General Comments: some agitation due to pain and fear of pain, uncooperative with wearing RLE brace due to pain, but completed all tasks with verbal cues for motivation        Exercises General Exercises - Upper Extremity Shoulder Horizontal ABduction: 10 reps Theraband Level (Shoulder Horizontal Abduction): Level 2 (Red) Elbow Flexion: 10 reps Theraband Level (Elbow Flexion): Level 2 (Red) Elbow Extension: 10 reps Theraband Level (Elbow Extension): Level 2 (Red) Other Exercises Other Exercises: AROM for R knee ext. and flexion x10, long holds for knee extension. Pt refused to do more than 10 each,    Shoulder Instructions       General Comments      Pertinent Vitals/ Pain       Pain Assessment Pain Assessment: 0-10 Pain Score: 6  Faces Pain Scale: Hurts even more  Home Living                                          Prior Functioning/Environment              Frequency  Min 2X/week        Progress Toward Goals  OT Goals(current goals can now be found in the care plan section)  Progress towards OT goals: Progressing toward goals  Acute Rehab OT Goals Patient Stated Goal: to decrease pain and improve ROM at RLE OT Goal Formulation: With patient Time For Goal Achievement: 12/01/22 Potential to Achieve Goals: Good ADL Goals Pt Will Perform Lower Body Dressing: with supervision;sitting/lateral leans Pt Will Transfer to Toilet: with min assist;anterior/posterior transfer Pt Will Perform Toileting - Clothing Manipulation and hygiene: with supervision;sitting/lateral leans Pt/caregiver will Perform Home Exercise Program: Increased strength;Right Upper extremity;Left upper extremity;Both right and left upper extremity;With  written HEP provided Additional ADL Goal #1: Pt will transfer to wheelchair while maintaining WB precautions with min guard A.  Plan Discharge plan remains appropriate    Co-evaluation          OT goals addressed during session: ADL's and self-care;Proper use of Adaptive equipment and DME;Strengthening/ROM      AM-PAC OT "6 Clicks" Daily Activity     Outcome Measure   Help from another person eating meals?: None Help from another person taking care of personal grooming?: A Little Help from another person toileting, which includes using toliet, bedpan, or urinal?: A Lot Help from another person bathing (including washing, rinsing, drying)?: A Lot Help from another person to put on and taking off regular upper body clothing?: A Little Help from another person to put on and taking off regular lower body clothing?: A Lot 6 Click Score: 16    End of Session    OT Visit Diagnosis: Unsteadiness on feet (R26.81);Other abnormalities of gait and mobility (  R26.89);Muscle weakness (generalized) (M62.81)   Activity Tolerance Patient limited by pain   Patient Left in bed;with call bell/phone within reach;with bed alarm set   Nurse Communication Mobility status        Time: 1132-1200 OT Time Calculation (min): 28 min  Charges: OT General Charges $OT Visit: 1 Visit OT Treatments $Self Care/Home Management : 8-22 mins $Therapeutic Exercise: 8-22 mins  [place] 11/23/2022, 3:24 PM

## 2022-11-23 NOTE — Progress Notes (Signed)
[facility] PROGRESS NOTE    Progress Note  [name]  ZI:[id number] DOB: [birth year] DOA: 10/15/2022 PCP: Demetrios Isaacs, FNP     Brief Narrative:   Ronald [name] is an 67 y.o. male past medical history of polysubstance abuse including alcohol cocaine, noncompliance right healed unstageable pressure ulcer peripheral vascular disease, recently discharged from the hospital home as he refused SNF comes into the hospital for acute encephalopathy, hypoxic and hypercarbic noted to be aspirating, required intubation admitted to the ICU extubated on 10/16/2022 transferred to [facility] 10[facility]18[facility]22 currently awaiting skilled nursing facility placement. MRI of the foot show large ulceration and underlying osteomyelitis no abscess.   Assessment/Plan:   Unstageable right heel pressure ulcer with early osteomyelitis: Orthopedic surgery was consulted and performed amputation on 11/17/2022, wound VAC now removed on wet-to-dry dressing changes. Now afebrile, cont keflex. PT OT evaluated the patient, awaiting skilled nursing facility placement.  Acute respiratory failure with hypoxia and hypercarbia: Has been wean to room air. KVO all IV  fluids patient is tolerating his diet.  Proteus Mirabella's pneumonia: He will finish his course of antibiotic Augmentin should cover.  Acute on chronic hyponatremia in the setting of beer potomania: Has returned to his baseline of 130-1 34.  Dysphagia: Tolerating a dysphagia 3 diet.  Electrolyte imbalance hypokalemia/hypomagnesemia: Monitor and replete as indicated.  Right jaw pain: CT scan showed no abscess.  Prediabetes mellitus type II: With an A1c of 5.6. Continue to monitor CBGs intermittently.  Chronic alcohol abuse/cocaine abuse: Continue folic acid thiamine and multivitamins no signs of withdrawal.  Peripheral vascular disease: History of left BKA.  Right elbow swelling cellulitis versus thrombophlebitis: Noted.  Stage II ischial  tuberosity pressure ulcer present on admission RN Pressure Injury Documentation: Pressure Injury 10/15/22 Ischial tuberosity Left;Posterior;Proximal Stage 2 -  Partial thickness loss of dermis presenting as a shallow open injury with a red, pink wound bed without slough. (Active)  10/15/22 2000  Location: Ischial tuberosity  Location Orientation: Left;Posterior;Proximal  Staging: Stage 2 -  Partial thickness loss of dermis presenting as a shallow open injury with a red, pink wound bed without slough.  Wound Description (Comments):   Present on Admission:   Dressing Type Foam - Lift dressing to assess site every shift 11/10/22 0900     Pressure Injury 10/15/22 Buttocks Mid;Upper Stage 1 -  Intact skin with non-blanchable redness of a localized area usually over a bony prominence. (Active)  10/15/22 1100  Location: Buttocks  Location Orientation: Mid;Upper  Staging: Stage 1 -  Intact skin with non-blanchable redness of a localized area usually over a bony prominence.  Wound Description (Comments):   Present on Admission:   Dressing Type Foam - Lift dressing to assess site every shift 11/22/22 0930    DVT prophylaxis: lovenox Family Communication:none Status is: Inpatient Remains inpatient appropriate because: Osteomyelitis    Code Status:     Code Status Orders  (From admission, onward)           Start     Ordered   10/15/22 0927  Full code  Continuous       Question:  By:  Answer:  Consent: discussion documented in EHR   10/15/22 0927           Code Status History     Date Active Date Inactive Code Status Order ID Comments User Context   09/19/2022 2106 09/30/2022 1914 Full Code [id number]  Etta Quill, DO ED   09/02/2022 1722 09/18/2022 0032 Full Code  [id number]  Erick Colace, NP ED   04/15/2022 0038 04/16/2022 1817 Full Code [id number]  Etta Quill, DO ED   03/18/2019 0228 03/26/2019 2142 Full Code [id number]  Milagros Loll, MD ED   08/29/2017 1901 08/31/2017  2307 Full Code [id number]  Valinda Party, DO Inpatient   01/23/2017 0427 01/23/2017 1224 Full Code [id number]  Charlann Lange, PA-C ED   06/19/2014 0249 06/19/2014 1817 Full Code [id number]  Rise Patience, MD Inpatient   12/28/2013 1854 01/01/2014 2253 Full Code [id number]  Newt Minion, MD Inpatient   12/25/2013 1352 12/28/2013 1854 Full Code [id number]  Consuelo Pandy, PA-C Inpatient   11/06/2013 2025 11/10/2013 2336 Full Code [id number]  Sheila Oats, MD Inpatient   09/19/2013 0234 09/26/2013 2115 Full Code [id number]  Etta Quill, DO ED         IV Access:   Peripheral IV   Procedures and diagnostic studies:   No results found.   Medical Consultants:   None.   Subjective:    [facility] no complains  Objective:    Vitals:   11/22/22 1506 11/22/22 1943 11/23/22 0427 11/23/22 0758  BP: (!) 154/91 (!) 151/85 (!) 146/88 (!) 150/91  Pulse: 88 91 84 84  Resp: '18 18 18 18  '$ Temp: 98.5 F (36.9 C) 98.6 F (37 C) 98.5 F (36.9 C) 98.4 F (36.9 C)  TempSrc: Oral Oral Oral Oral  SpO2: 100% 100% 99% 98%  Weight:      Height:       SpO2: 98 % O2 Flow Rate (L/min): 2 L/min FiO2 (%): 40 %   Intake/Output Summary (Last 24 hours) at 11/23/2022 0844 Last data filed at 11/23/2022 0500 Gross per 24 hour  Intake 1320 ml  Output 5100 ml  Net -3780 ml    Filed Weights   11/12/22 0340 11/17/22 1317 11/21/22 0708  Weight: 68.9 kg 70 kg 67.4 kg    Exam: General exam: In no acute distress. Respiratory system: Good air movement and clear to auscultation. Cardiovascular system: S1 & S2 heard, RRR. No JVD. Gastrointestinal system: Abdomen is nondistended, soft and nontender.  Extremities: No pedal edema. Skin: No rashes, lesions or ulcers Psychiatry: Judgement and insight appear normal. Mood & affect appropriate.   Data Reviewed:    Labs: Basic Metabolic Panel: No results for input(s): "NA", "K", "CL", "CO2", "GLUCOSE", "BUN", "CREATININE",  "CALCIUM", "MG", "PHOS" in the last 168 hours.  GFR Estimated Creatinine Clearance: 86.1 mL/min (A) (by C-G formula based on SCr of 0.52 mg/dL (L)). Liver Function Tests: No results for input(s): "AST", "ALT", "ALKPHOS", "BILITOT", "PROT", "ALBUMIN" in the last 168 hours.  No results for input(s): "LIPASE", "AMYLASE" in the last 168 hours. No results for input(s): "AMMONIA" in the last 168 hours. Coagulation profile No results for input(s): "INR", "PROTIME" in the last 168 hours. COVID-19 Labs  No results for input(s): "DDIMER", "FERRITIN", "LDH", "CRP" in the last 72 hours.  Lab Results  Component Value Date   SARSCOV2NAA NEGATIVE 10/15/2022   SARSCOV2NAA NEGATIVE 09/19/2022   [place] NEGATIVE 10/18/2020   [place] NEGATIVE 03/18/2019    CBC: No results for input(s): "WBC", "NEUTROABS", "HGB", "HCT", "MCV", "PLT" in the last 168 hours.  Cardiac Enzymes: No results for input(s): "CKTOTAL", "CKMB", "CKMBINDEX", "TROPONINI" in the last 168 hours. BNP (last 3 results) No results for input(s): "PROBNP" in the last 8760 hours. CBG: No results for input(s): "GLUCAP" in the last 168 hours. D-Dimer: No results for input(s): "DDIMER"  in the last 72 hours. Hgb A1c: No results for input(s): "HGBA1C" in the last 72 hours. Lipid Profile: No results for input(s): "CHOL", "HDL", "LDLCALC", "TRIG", "CHOLHDL", "LDLDIRECT" in the last 72 hours. Thyroid function studies: No results for input(s): "TSH", "T4TOTAL", "T3FREE", "THYROIDAB" in the last 72 hours.  Invalid input(s): "FREET3" Anemia work up: No results for input(s): "VITAMINB12", "FOLATE", "FERRITIN", "TIBC", "IRON", "RETICCTPCT" in the last 72 hours. Sepsis Labs: No results for input(s): "PROCALCITON", "WBC", "LATICACIDVEN" in the last 168 hours.  Microbiology Recent Results (from the past 240 hour(s))  Surgical pcr screen     Status: None   Collection Time: 11/17/22  4:41 AM   Specimen: Nasal Mucosa; Nasal Swab   Result Value Ref Range Status   MRSA, PCR NEGATIVE NEGATIVE Final   Staphylococcus aureus NEGATIVE NEGATIVE Final    Comment: (NOTE) The Xpert SA Assay (FDA approved for NASAL specimens in patients 93 years of age and older), is one component of a comprehensive surveillance program. It is not intended to diagnose infection nor to guide or monitor treatment. Performed at [facility], [place] [address] [place], [place] [address]      Medications:    vitamin C  1,000 mg Oral Daily   cephALEXin  500 mg Oral Q8H   docusate sodium  100 mg Oral Daily   feeding supplement (NEPRO CARB STEADY)  237 mL Oral Q24H   fentaNYL (SUBLIMAZE) injection  50 mcg Intravenous Once   fluticasone furoate-vilanterol  1 puff Inhalation Daily   folic acid  1 mg Oral Daily   heparin  5,000 Units Subcutaneous Q8H   magnesium oxide  400 mg Oral Daily   metoprolol tartrate  50 mg Oral BID   multivitamin with minerals  1 tablet Oral Daily   nutrition supplement (JUVEN)  1 packet Oral BID BM   pantoprazole  40 mg Oral Daily   polyethylene glycol  17 g Oral Daily   thiamine  100 mg Oral Daily   zinc sulfate  220 mg Oral Daily   Continuous Infusions:  sodium chloride 10 mL/hr at 11/19/22 1852   magnesium sulfate bolus IVPB        LOS: 39 days   Charlynne Cousins  [facility]  11/23/2022, 8:44 AM

## 2022-11-23 NOTE — Progress Notes (Signed)
Inpatient Rehab Admissions Coordinator:    CIR reconsulted. PT/OT continue to recommend SNF, Pt. Has not been out of bed since 3/7, and rehab MD reviewed case last week and felt that Pt. Is not a candidate for CIR. I do not think Pt. Has demonstrated significant improvements since that assessment, I will not pursue for admit.    Clemens Catholic, [place], [place] Admissions Coordinator  [phone] ([place]) [phone] (office)

## 2022-11-24 DIAGNOSIS — F1010 Alcohol abuse, uncomplicated: Secondary | ICD-10-CM | POA: Diagnosis not present

## 2022-11-24 DIAGNOSIS — Z720 Tobacco use: Secondary | ICD-10-CM | POA: Diagnosis not present

## 2022-11-24 DIAGNOSIS — J1569 Pneumonia due to other gram-negative bacteria: Secondary | ICD-10-CM | POA: Diagnosis not present

## 2022-11-24 DIAGNOSIS — J9601 Acute respiratory failure with hypoxia: Secondary | ICD-10-CM | POA: Diagnosis not present

## 2022-11-24 DIAGNOSIS — E871 Hypo-osmolality and hyponatremia: Secondary | ICD-10-CM | POA: Diagnosis not present

## 2022-11-24 MED ORDER — DAKINS (1/4 STRENGTH) 0.125 % EX SOLN
Freq: Two times a day (BID) | CUTANEOUS | Status: AC
  Filled 2022-11-24: qty 473

## 2022-11-24 NOTE — Progress Notes (Signed)
Patient ID: Ronald [name], male   DOB: 06/08/1956, 67 y.o.   MRN: [id number] Patient is status post transtibial amputation.  There is some mild cellulitis and blistering over the surgical incision.  Patient is on Keflex.  Symptoms may be secondary from patient's knee contracture with pressure over the residual limb.  Recommend placing pillows under the thigh to keep the residual limb off the bed.

## 2022-11-24 NOTE — Progress Notes (Signed)
[facility]  PROGRESS NOTE  [name] DOB: Aug 24, 1956 DOA: 10/15/2022 PCP: Demetrios Isaacs, FNP   Brief HPI:   67 y.o. male past medical history of polysubstance abuse including alcohol cocaine, noncompliance right healed unstageable pressure ulcer peripheral vascular disease, recently discharged from the hospital home as he refused SNF comes into the hospital for acute encephalopathy, hypoxic and hypercarbic noted to be aspirating, required intubation admitted to the ICU extubated on 10/16/2022 transferred to [facility] 10[facility]18[facility]2022 currently awaiting skilled nursing facility placement. MRI of the foot show large ulceration and underlying osteomyelitis no abscess.     Subjective   Patient seen and examined, denies any complaints.   Assessment/Plan:     Unstageable right heel pressure ulcer with early osteomyelitis: Orthopedic surgery was consulted and performed amputation on 11/17/2022, wound VAC now removed on wet-to-dry dressing changes. Now afebrile, cont keflex. PT OT evaluated the patient, awaiting skilled nursing facility placement.   Acute respiratory failure with hypoxia and hypercarbia: Has been wean to room air. KVO all IV  fluids patient is tolerating his diet.   Proteus mirabilis pneumonia -Complete course with Augmentin   Acute on chronic hyponatremia in the setting of beer potomania: Has returned to his baseline of 130-1 34.   Dysphagia: Tolerating a dysphagia 3 diet.   Electrolyte imbalance hypokalemia/hypomagnesemia: Monitor and replete as indicated.   Right jaw pain: CT scan showed no abscess.   Prediabetes mellitus type II: With an A1c of 5.6. Continue to monitor CBGs intermittently. -CBG well-controlled   Chronic alcohol abuse/cocaine abuse: Continue folic acid thiamine and multivitamins no signs of withdrawal.   Peripheral vascular disease: History of left BKA.   Right elbow swelling cellulitis versus  thrombophlebitis: Noted.   Stage II ischial tuberosity pressure ulcer present on admission RN Pressure Injury Documentation:     Pressure Injury 10/15/22 Ischial tuberosity Left;Posterior;Proximal Stage 2 -  Partial thickness loss of dermis presenting as a shallow open injury with a red, pink wound bed without slough. (Active)  10/15/22 2000  Location: Ischial tuberosity  Location Orientation: Left;Posterior;Proximal  Staging: Stage 2 -  Partial thickness loss of dermis presenting as a shallow open injury with a red, pink wound bed without slough.  Wound Description (Comments):   Present on Admission:   Dressing Type Foam - Lift dressing to assess site every shift 11/10/22 0900     Pressure Injury 10/15/22 Buttocks Mid;Upper Stage 1 -  Intact skin with non-blanchable redness of a localized area usually over a bony prominence. (Active)  10/15/22 1100  Location: Buttocks  Location Orientation: Mid;Upper  Staging: Stage 1 -  Intact skin with non-blanchable redness of a localized area usually over a bony prominence.  Wound Description (Comments):   Present on Admission:   Dressing Type Foam - Lift dressing to assess site every shift 11/22/22 0930      Medications     vitamin C  1,000 mg Oral Daily   cephALEXin  500 mg Oral Q8H   docusate sodium  100 mg Oral Daily   feeding supplement (NEPRO CARB STEADY)  237 mL Oral Q24H   fentaNYL (SUBLIMAZE) injection  50 mcg Intravenous Once   fluticasone furoate-vilanterol  1 puff Inhalation Daily   folic acid  1 mg Oral Daily   heparin  5,000 Units Subcutaneous Q8H   magnesium oxide  400 mg Oral Daily   metoprolol tartrate  50 mg Oral BID   multivitamin with minerals  1 tablet Oral Daily   nutrition  supplement (JUVEN)  1 packet Oral BID BM   pantoprazole  40 mg Oral Daily   polyethylene glycol  17 g Oral Daily   sodium hypochlorite   Irrigation BID   thiamine  100 mg Oral Daily   zinc sulfate  220 mg Oral Daily     Data Reviewed:    CBG:  No results for input(s): "GLUCAP" in the last 168 hours.  SpO2: 99 % O2 Flow Rate (L/min): 2 L/min FiO2 (%): 40 %    Vitals:   11/23/22 2017 11/24/22 0408 11/24/22 0728 11/24/22 1351  BP: (!) 140/74 (!) 156/78 124/68 111/73  Pulse: 100 (!) 109 (!) 103 90  Resp: '18 16 16 16  '$ Temp: 98.2 F (36.8 C) 98.7 F (37.1 C) (!) 100.4 F (38 C) 99 F (37.2 C)  TempSrc:   Oral Oral  SpO2: 96% 96% 98% 99%  Weight:      Height:          Data Reviewed:  Basic Metabolic Panel: No results for input(s): "NA", "K", "CL", "CO2", "GLUCOSE", "BUN", "CREATININE", "CALCIUM", "MG", "PHOS" in the last 168 hours.  CBC: No results for input(s): "WBC", "NEUTROABS", "HGB", "HCT", "MCV", "PLT" in the last 168 hours.  LFT No results for input(s): "AST", "ALT", "ALKPHOS", "BILITOT", "PROT", "ALBUMIN" in the last 168 hours.   Antibiotics: Anti-infectives (From admission, onward)    Start     Dose/Rate Route Frequency Ordered Stop   11/20/22 1000  amoxicillin-clavulanate (AUGMENTIN) 875-125 MG per tablet 1 tablet  Status:  Discontinued        1 tablet Oral Every 12 hours 11/20/22 0851 11/20/22 0852   11/20/22 0945  cephALEXin (KEFLEX) capsule 500 mg        500 mg Oral Every 8 hours 11/20/22 0853     11/17/22 2300  ceFAZolin (ANCEF) IVPB 2g/100 mL premix        2 g 200 mL/hr over 30 Minutes Intravenous Every 8 hours 11/17/22 1552 11/18/22 1056   11/17/22 1300  ceFAZolin (ANCEF) IVPB 2g/100 mL premix        2 g 200 mL/hr over 30 Minutes Intravenous On call to O.R. 11/17/22 0442 11/17/22 1440   11/15/22 1030  amoxicillin-clavulanate (AUGMENTIN) 875-125 MG per tablet 1 tablet        1 tablet Oral Every 12 hours 11/15/22 0931 11/18/22 0959   11/08/22 1000  amoxicillin-clavulanate (AUGMENTIN) 875-125 MG per tablet 1 tablet  Status:  Discontinued        1 tablet Oral Every 12 hours 11/08/22 0840 11/13/22 1107   11/06/22 1015  amoxicillin-clavulanate (AUGMENTIN) 875-125 MG per tablet 1 tablet   Status:  Discontinued        1 tablet Oral Every 12 hours 11/06/22 0916 11/08/22 0815   10/27/22 1030  amoxicillin-clavulanate (AUGMENTIN) 875-125 MG per tablet 1 tablet        1 tablet Oral Every 12 hours 10/27/22 1020 10/27/22 2224   10/20/22 1045  amoxicillin-clavulanate (AUGMENTIN) 875-125 MG per tablet 1 tablet  Status:  Discontinued        1 tablet Oral Every 12 hours 10/20/22 0959 10/27/22 1020   10/15/22 1000  Ampicillin-Sulbactam (UNASYN) 3 g in sodium chloride 0.9 % 100 mL IVPB        3 g 200 mL/hr over 30 Minutes Intravenous Every 6 hours 10/15/22 0949 10/20/22 0858        DVT prophylaxis: Heparin  Code Status: Full code  Family Communication: No family at bedside  CONSULTS    Objective    Physical Examination:   General-appears in no acute distress Heart-S1-S2, regular, no murmur auscultated Lungs-clear to auscultation bilaterally, no wheezing or crackles auscultated Abdomen-soft, nontender, no organomegaly Extremities-no edema in the lower extremities Neuro-alert, oriented x3, no focal deficit noted   Status is: Inpatient:      Pressure Injury 10/15/22 Buttocks Mid;Upper Stage 1 -  Intact skin with non-blanchable redness of a localized area usually over a bony prominence. (Active)  10/15/22 1100  Location: Buttocks  Location Orientation: Mid;Upper  Staging: Stage 1 -  Intact skin with non-blanchable redness of a localized area usually over a bony prominence.  Wound Description (Comments):   Present on Admission:         [place]   [facility] If 7PM-7AM, please contact night-coverage at [email or web address], Office  [phone]   11/24/2022, 7:10 PM  LOS: 40 days

## 2022-11-24 NOTE — Progress Notes (Signed)
Patient noted to be more intermittently confused this morning than previous,. Asking for pants on counter (no pants on counter), and saying he has "...people here upstairs on [address]." Observed laying at foot of bed. Bed alarm activated. Right stump dressing removed by patient (2x) overnight; patient complains that dressing causes stump to be in more pain. Surgical site appears reddened with forming blisters distal to staple site.

## 2022-11-24 NOTE — TOC Progression Note (Addendum)
Transition of Care [facility]) - Progression Note    Patient Details  Name: Ronald [name] MRN: [id number] Date of Birth: [birth year]  [facility] CM/SW Contact  Joanne Chars, LCSW Phone Number: 11/24/2022, 1:58 PM  Clinical Narrative:   CSW spoke with [facility] supervisor Barbette Or regarding lack of bed offers for this pt.  Discussed that pt does have decision making capacity.  Did agree to SNF referral but has also talked about DC home.  Supervisor asked CSW to check medicare status to determine if pt has medicare as well as medicaid.  Email sent to financial counseling.   1130: Message from financial counseling.  Pt has medicare part B but not part A.     1330: CSW spoke with pt regarding lack of bed offers.  Pt does report that if there are no SNF bed offers, he would prefer to return home.      Expected Discharge Plan: [place] Barriers to Discharge: Continued Medical Work up  Expected Discharge Plan and Services In-house Referral: Clinical Social Work     Living arrangements for the past 2 months: Single Family Home                                       Social Determinants of Health (SDOH) Interventions SDOH Screenings   Food Insecurity: No Food Insecurity (10/17/2022)  Recent Concern: Food Insecurity - Food Insecurity Present (09/17/2022)  Housing: Medium Risk (10/17/2022)  Transportation Needs: Unmet Transportation Needs (10/17/2022)  Utilities: Not At Risk (10/17/2022)  Tobacco Use: High Risk (11/18/2022)    Readmission Risk Interventions     No data to display

## 2022-11-24 NOTE — Progress Notes (Signed)
Physical Therapy Treatment Patient Details Name: Ronald [name] MRN: [id number] DOB: [birth year] Today's Date: 11/24/2022   History of Present Illness This is a 67 year old gentleman admitted 2/2 after being found down at home after a recent d/c from hospital where he refused SNF. Pt was hyponatremic hypoxemic hypercarbic was placed on BiPAP in the emergency room. Ultimately failed BiPAP requiring intubation mechanical ventilation 2/2--2/3. 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 PMH: polysubstance abuse, cocaine, alcohol use.    PT Comments    Pt was received in supine and agreeable to session with encouragement. Pt on bedpan upon arrival and RN present to assist with pericare. Pt required up to mod A to roll x3 due to increased RLE pain. Pt with complaints of RLE pain when blanket touched it, however noted to have removed dressing and refusing dressing application. Pt deferred further mobility and exercises despite education and encouragement. Pt provided copy of HEP per pt request and encouraged to complete exercises independently. Pt continues to benefit from PT services to progress toward functional mobility goals.    Recommendations for follow up therapy are one component of a multi-disciplinary discharge planning process, led by the attending physician.  Recommendations may be updated based on patient status, additional functional criteria and insurance authorization.  Follow Up Recommendations  Skilled nursing-short term rehab (<3 hours/day) Can patient physically be transported by private vehicle: No   Assistance Recommended at Discharge Frequent or constant Supervision/Assistance  Patient can return home with the following A lot of help with bathing/dressing/bathroom;Assistance with cooking/housework;Direct supervision/assist for medications management;Direct supervision/assist for financial management;Assist for transportation;Help with stairs or ramp for entrance;Two people to help with walking and/or  transfers   Equipment Recommendations  Wheelchair (measurements PT);Wheelchair cushion (measurements PT)    Recommendations for Other Services       Precautions / Restrictions Precautions Precautions: Fall Precaution Comments: 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) . New Rt BKA Restrictions Weight Bearing Restrictions: Yes RLE Weight Bearing: Non weight bearing     Mobility  Bed Mobility Overal bed mobility: Needs Assistance Bed Mobility: Rolling Rolling: Mod assist         General bed mobility comments: Mod A for rolling due to increased pain. Cues for use of handrail    Transfers                   General transfer comment: Pt declined OOB mobility        Balance       Sitting balance - Comments: unable to assess                                    Cognition Arousal/Alertness: Awake/alert Behavior During Therapy: Agitated Overall Cognitive Status: Within Functional Limits for tasks assessed                                 General Comments: Pt became agitated with increased pain and when asked questions        Exercises Other Exercises Other Exercises: Reviewed HEP    General Comments General comments (skin integrity, edema, etc.): Pt noted to have removed RLE dressing. Pt became agitated with attempts at education and questions. Pt refused pillow under RLE for pressure relief and was not accepting of education.      Pertinent Vitals/Pain Pain Assessment Pain Assessment: Faces Faces Pain Scale: Hurts whole  lot Pain Location: Rt residual limb Pain Intervention(s): Limited activity within patient's tolerance, Monitored during session, Repositioned     PT Goals (current goals can now be found in the care plan section) Acute Rehab PT Goals Patient Stated Goal: none stated PT Goal Formulation: With patient Time For Goal Achievement: 12/02/22 Potential to Achieve Goals: Fair Progress towards PT goals: Not  progressing toward goals - comment (due to agitation and lack of motivation to participate in therapy)    Frequency    Min 3X/week      PT Plan Current plan remains appropriate       AM-PAC PT "6 Clicks" Mobility   Outcome Measure  Help needed turning from your back to your side while in a flat bed without using bedrails?: A Lot Help needed moving from lying on your back to sitting on the side of a flat bed without using bedrails?: A Lot Help needed moving to and from a bed to a chair (including a wheelchair)?: Total Help needed standing up from a chair using your arms (e.g., wheelchair or bedside chair)?: Total Help needed to walk in hospital room?: Total Help needed climbing 3-5 steps with a railing? : Total 6 Click Score: 8    End of Session   Activity Tolerance: Treatment limited secondary to agitation Patient left: with call bell/phone within reach;in bed;with nursing/sitter in room Nurse Communication: Mobility status PT Visit Diagnosis: Other abnormalities of gait and mobility (R26.89);Muscle weakness (generalized) (M62.81);History of falling (Z91.81);Pain;Difficulty in walking, not elsewhere classified (R26.2)     Time: [id number] PT Time Calculation (min) (ACUTE ONLY): 16 min  Charges:  $Therapeutic Activity: 8-22 mins                     Michelle Nasuti, PTA Acute Rehabilitation Services Secure Chat Preferred  [phone]    Michelle Nasuti 11/24/2022, 3:58 PM

## 2022-11-25 DIAGNOSIS — J9601 Acute respiratory failure with hypoxia: Secondary | ICD-10-CM | POA: Diagnosis not present

## 2022-11-25 DIAGNOSIS — Z720 Tobacco use: Secondary | ICD-10-CM | POA: Diagnosis not present

## 2022-11-25 DIAGNOSIS — F1010 Alcohol abuse, uncomplicated: Secondary | ICD-10-CM | POA: Diagnosis not present

## 2022-11-25 DIAGNOSIS — J1569 Pneumonia due to other gram-negative bacteria: Secondary | ICD-10-CM | POA: Diagnosis not present

## 2022-11-25 DIAGNOSIS — E871 Hypo-osmolality and hyponatremia: Secondary | ICD-10-CM | POA: Diagnosis not present

## 2022-11-25 NOTE — Progress Notes (Signed)
[facility]  PROGRESS NOTE  [name] DOB: Sep 09, 1956 DOA: 10/15/2022 PCP: Demetrios Isaacs, FNP   Brief HPI:   67 y.o. male past medical history of polysubstance abuse including alcohol cocaine, noncompliance right healed unstageable pressure ulcer peripheral vascular disease, recently discharged from the hospital home as he refused SNF comes into the hospital for acute encephalopathy, hypoxic and hypercarbic noted to be aspirating, required intubation admitted to the ICU extubated on 10/16/2022 transferred to [facility] 10[facility]18[facility]22 currently awaiting skilled nursing facility placement. MRI of the foot show large ulceration and underlying osteomyelitis no abscess.     Subjective   Patient seen and examined, no new complaints.   Assessment/Plan:     Unstageable right heel pressure ulcer with early osteomyelitis: Orthopedic surgery was consulted and performed amputation on 11/17/2022, wound VAC now removed on wet-to-dry dressing changes. Now afebrile, cont keflex. PT OT evaluated the patient, awaiting skilled nursing facility placement.   Acute respiratory failure with hypoxia and hypercarbia: Has been wean to room air. KVO all IV  fluids patient is tolerating his diet.   Proteus mirabilis pneumonia -Complete course with Augmentin   Acute on chronic hyponatremia in the setting of beer potomania: Has returned to his baseline of 130-1 34.   Dysphagia: Tolerating a dysphagia 3 diet.   Electrolyte imbalance hypokalemia/hypomagnesemia: Monitor and replete as indicated.   Right jaw pain: CT scan showed no abscess.   Prediabetes mellitus type II: With an A1c of 5.6. Continue to monitor CBGs intermittently. -CBG well-controlled   Chronic alcohol abuse/cocaine abuse: Continue folic acid thiamine and multivitamins no signs of withdrawal.   Peripheral vascular disease: History of left BKA.   Right elbow swelling cellulitis versus thrombophlebitis: Noted.    Stage II ischial tuberosity pressure ulcer present on admission RN Pressure Injury Documentation:     Pressure Injury 10/15/22 Ischial tuberosity Left;Posterior;Proximal Stage 2 -  Partial thickness loss of dermis presenting as a shallow open injury with a red, pink wound bed without slough. (Active)  10/15/22 2000  Location: Ischial tuberosity  Location Orientation: Left;Posterior;Proximal  Staging: Stage 2 -  Partial thickness loss of dermis presenting as a shallow open injury with a red, pink wound bed without slough.  Wound Description (Comments):   Present on Admission:   Dressing Type Foam - Lift dressing to assess site every shift 11/10/22 0900     Pressure Injury 10/15/22 Buttocks Mid;Upper Stage 1 -  Intact skin with non-blanchable redness of a localized area usually over a bony prominence. (Active)  10/15/22 1100  Location: Buttocks  Location Orientation: Mid;Upper  Staging: Stage 1 -  Intact skin with non-blanchable redness of a localized area usually over a bony prominence.  Wound Description (Comments):   Present on Admission:   Dressing Type Foam - Lift dressing to assess site every shift 11/22/22 0930      Medications     vitamin C  1,000 mg Oral Daily   cephALEXin  500 mg Oral Q8H   docusate sodium  100 mg Oral Daily   feeding supplement (NEPRO CARB STEADY)  237 mL Oral Q24H   fentaNYL (SUBLIMAZE) injection  50 mcg Intravenous Once   fluticasone furoate-vilanterol  1 puff Inhalation Daily   folic acid  1 mg Oral Daily   heparin  5,000 Units Subcutaneous Q8H   magnesium oxide  400 mg Oral Daily   metoprolol tartrate  50 mg Oral BID   multivitamin with minerals  1 tablet Oral Daily   nutrition  supplement (JUVEN)  1 packet Oral BID BM   pantoprazole  40 mg Oral Daily   polyethylene glycol  17 g Oral Daily   sodium hypochlorite   Irrigation BID   thiamine  100 mg Oral Daily   zinc sulfate  220 mg Oral Daily     Data Reviewed:   CBG:  No results for  input(s): "GLUCAP" in the last 168 hours.  SpO2: 98 % O2 Flow Rate (L/min): 2 L/min FiO2 (%): 40 %    Vitals:   11/25/22 0752 11/25/22 0818 11/25/22 1449 11/25/22 1510  BP: 123/76  110/73 117/69  Pulse: 95  89 92  Resp: '17  17 16  '$ Temp: 98.1 F (36.7 C)  98 F (36.7 C) 98.7 F (37.1 C)  TempSrc: Oral  Oral Oral  SpO2: 98% 98% 98% 98%  Weight:      Height:          Data Reviewed:  Basic Metabolic Panel: No results for input(s): "NA", "K", "CL", "CO2", "GLUCOSE", "BUN", "CREATININE", "CALCIUM", "MG", "PHOS" in the last 168 hours.  CBC: No results for input(s): "WBC", "NEUTROABS", "HGB", "HCT", "MCV", "PLT" in the last 168 hours.  LFT No results for input(s): "AST", "ALT", "ALKPHOS", "BILITOT", "PROT", "ALBUMIN" in the last 168 hours.   Antibiotics: Anti-infectives (From admission, onward)    Start     Dose/Rate Route Frequency Ordered Stop   11/20/22 1000  amoxicillin-clavulanate (AUGMENTIN) 875-125 MG per tablet 1 tablet  Status:  Discontinued        1 tablet Oral Every 12 hours 11/20/22 0851 11/20/22 0852   11/20/22 0945  cephALEXin (KEFLEX) capsule 500 mg        500 mg Oral Every 8 hours 11/20/22 0853     11/17/22 2300  ceFAZolin (ANCEF) IVPB 2g/100 mL premix        2 g 200 mL/hr over 30 Minutes Intravenous Every 8 hours 11/17/22 1552 11/18/22 1056   11/17/22 1300  ceFAZolin (ANCEF) IVPB 2g/100 mL premix        2 g 200 mL/hr over 30 Minutes Intravenous On call to O.R. 11/17/22 0442 11/17/22 1440   11/15/22 1030  amoxicillin-clavulanate (AUGMENTIN) 875-125 MG per tablet 1 tablet        1 tablet Oral Every 12 hours 11/15/22 0931 11/18/22 0959   11/08/22 1000  amoxicillin-clavulanate (AUGMENTIN) 875-125 MG per tablet 1 tablet  Status:  Discontinued        1 tablet Oral Every 12 hours 11/08/22 0840 11/13/22 1107   11/06/22 1015  amoxicillin-clavulanate (AUGMENTIN) 875-125 MG per tablet 1 tablet  Status:  Discontinued        1 tablet Oral Every 12 hours 11/06/22 0916  11/08/22 0815   10/27/22 1030  amoxicillin-clavulanate (AUGMENTIN) 875-125 MG per tablet 1 tablet        1 tablet Oral Every 12 hours 10/27/22 1020 10/27/22 2224   10/20/22 1045  amoxicillin-clavulanate (AUGMENTIN) 875-125 MG per tablet 1 tablet  Status:  Discontinued        1 tablet Oral Every 12 hours 10/20/22 0959 10/27/22 1020   10/15/22 1000  Ampicillin-Sulbactam (UNASYN) 3 g in sodium chloride 0.9 % 100 mL IVPB        3 g 200 mL/hr over 30 Minutes Intravenous Every 6 hours 10/15/22 0949 10/20/22 0858        DVT prophylaxis: Heparin  Code Status: Full code  Family Communication: No family at bedside   CONSULTS    Objective  Physical Examination:   General-appears in no acute distress Heart-S1-S2, regular, no murmur auscultated Lungs-clear to auscultation bilaterally, no wheezing or crackles auscultated Abdomen-soft, nontender, no organomegaly Extremities-s/p right BKA, has existing left transtibial amputation Neuro-alert, oriented x3, no focal deficit noted   Status is: Inpatient:      Pressure Injury 10/15/22 Buttocks Mid;Upper Stage 1 -  Intact skin with non-blanchable redness of a localized area usually over a bony prominence. (Active)  10/15/22 1100  Location: Buttocks  Location Orientation: Mid;Upper  Staging: Stage 1 -  Intact skin with non-blanchable redness of a localized area usually over a bony prominence.  Wound Description (Comments):   Present on Admission:         [place]   [facility] If 7PM-7AM, please contact night-coverage at [email or web address], Office  [phone]   11/25/2022, 5:31 PM  LOS: 41 days

## 2022-11-25 NOTE — Progress Notes (Signed)
Dressing changed overnight; patient removed dressing c/o it causing him more pain. Refused redress.  Cleaned with dakins as ordered, placed extremity on paper chux elevated on pillow. Bed low and locked, call bell in reach.

## 2022-11-25 NOTE — Progress Notes (Deleted)
Occupational Therapy Treatment Patient Details Name: Ronald [name] MRN: [id number] DOB: 1956/07/04 Today's Date: 11/25/2022   History of present illness This is a 67 year old gentleman admitted 2/2 after being found down at home after a recent d/c from hospital where he refused SNF. Pt was hyponatremic hypoxemic hypercarbic was placed on BiPAP in the emergency room. Ultimately failed BiPAP requiring intubation mechanical ventilation 2/2--2/3. 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 PMH: polysubstance abuse, cocaine, alcohol use.   OT comments  Pt agitated today, but actively participated out of frustration with nursing tech present to assist in sliding board t/f, mod A, from bed to chair.  Pt displays decreased safety awareness as when he gets agitated he rushes and states he will try to get back to bed by himself whether he falls or not, with nurse and nurse tech present.  Pt calmed down and agreed to t/f and completed without sliding board with mod A, stating sliding board was annoying.  Pt agreeable to exercises to improve strength and functional independence and calmed down at end of session. Pt would greatly benefit from continued skilled therapy to improve strength and function for best outcome and carryover of learned techniques.   Recommendations for follow up therapy are one component of a multi-disciplinary discharge planning process, led by the attending physician.  Recommendations may be updated based on patient status, additional functional criteria and insurance authorization.    Follow Up Recommendations        Assistance Recommended at Discharge    Patient can return home with the following      Equipment Recommendations       Recommendations for Other Services      Precautions / Restrictions Precautions Precautions: Fall Precaution Comments: 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) . New Rt BKA Restrictions Weight Bearing Restrictions: Yes RLE Weight Bearing: Non weight bearing        Mobility Bed Mobility Overal bed mobility: Needs Assistance Bed Mobility: Rolling, Supine to Sit Rolling: Mod assist   Supine to sit: HOB elevated, Supervision Sit to supine: Max assist (for scotting L/R)   General bed mobility comments: Mod A for rolling due to increased pain. Cues for use of handrail    Transfers Overall transfer level: Needs assistance Equipment used: Sliding board Transfers: Bed to chair/wheelchair/BSC            Lateral/Scoot Transfers: Mod assist, With slide board, From elevated surface, +2 physical assistance General transfer comment: Pt. agitated when asked to perform sliding board t/f, but agreed to attempt. Pt c/o severe RLE pain at surgical site. refused to spend any amount of time in chair, went straight back to bed.     Balance   Sitting-balance support: Bilateral upper extremity supported Sitting balance-Leahy Scale: Fair                                     ADL either performed or assessed with clinical judgement   ADL                                              Extremity/Trunk Assessment Upper Extremity Assessment Upper Extremity Assessment: Generalized weakness            Vision       Perception     Praxis  Cognition Arousal/Alertness: Awake/alert Behavior During Therapy: Agitated Overall Cognitive Status: Within Functional Limits for tasks assessed Area of Impairment: Safety/judgement, Following commands, Attention                   Current Attention Level: Selective     Safety/Judgement: Decreased awareness of safety     General Comments: Pt became agitated with increased pain and when asked to complete activities        Exercises Exercises: General Upper Extremity General Exercises - Upper Extremity Shoulder Horizontal ABduction: 15 reps, Strengthening, Theraband Theraband Level (Shoulder Horizontal Abduction): Level 2 (Red) Elbow Flexion: 20 reps, Theraband,  Strengthening Theraband Level (Elbow Flexion): Level 2 (Red) Elbow Extension: 20 reps, Theraband, Strengthening Theraband Level (Elbow Extension): Level 2 (Red)    Shoulder Instructions       General Comments      Pertinent Vitals/ Pain       Pain Assessment Pain Assessment: 0-10 Pain Score: 7  Faces Pain Scale: Hurts even more  Home Living                                          Prior Functioning/Environment              Frequency           Progress Toward Goals  OT Goals(current goals can now be found in the care plan section)        Plan      Co-evaluation          OT goals addressed during session: Proper use of Adaptive equipment and DME;Strengthening/ROM      AM-PAC OT "6 Clicks" Daily Activity     Outcome Measure                    End of Session        Activity Tolerance     Patient Left     Nurse Communication          Time:  -     Charges:    Ronald [name], Ronald [name] 11/25/2022, 4:18 PM

## 2022-11-25 NOTE — Progress Notes (Signed)
Nutrition Follow-up  DOCUMENTATION CODES:   Severe malnutrition in context of chronic illness  INTERVENTION:  Continue Regular diet  Nepro Shake po once daily, each supplement provides 425 kcal and 19 grams protein Juven BID to support wound healing MVI with minerals daily  NUTRITION DIAGNOSIS:   Severe Malnutrition related to chronic illness (COPD, polysubstance abuse) as evidenced by severe fat depletion, severe muscle depletion. - ongoing  GOAL:   Patient will meet greater than or equal to 90% of their needs - goal met via meals and nutrition supplements  MONITOR:   PO intake, Labs, Weight trends  REASON FOR ASSESSMENT:   Ventilator, Consult Enteral/tube feeding initiation and management  ASSESSMENT:   67 year old male who presented to the ED on 2/02 with SOB, chest pain, and AMS. PMH of COPD, ETOH abuse, cocaine abuse, noncompliance, DM, PVD s/p L BKA. Pt required intubation. Pt admitted with acute respiratory failure, hyponatremia.  2/3: extubated 2/4: s/p BSE- recommend dysphagia 3, nectar thick liquids  2/10: s/p MBS-continue dysphagia 3, liquids adv to thin  3/7: s/p R transtibial amputation + wound VAC    Not candidate for CIR. Continues to await SNF placement.   Pt continues with good PO intake. Continues with 100% of meals, snacks frequently between meals and consumes nutrition supplements.   Admit weight: 59 kg Current weight: 67.4 kg  Despite having R BKA (on 3/7), pt's weight continues to gradually trend up throughout admission although appears to be stabilizing around 65-67 kg. Will continue to monitor trends for any significant weight changes.   Medications: Vitamin C '1000mg'$  daily, colace, folvite, mag-ox, MVI, protonix, miralax, thiamine, zinc (ending 3/20)\  Labs reviewed  Diet Order:   Diet Order             Diet regular Room service appropriate? Yes; Fluid consistency: Thin  Diet effective now                   EDUCATION NEEDS:    Not appropriate for education at this time  Skin:  Skin Assessment: Skin Integrity Issues: Skin Integrity Issues:: Incisions, Stage I Stage I: mid upper buttocks Stage II: ischial tuberosity Incisions: R leg (closed) (3/13) Per Ortho, noted mild cellulitis and blistering over surgical incision R transtibial amputation.  Last BM:  3/13 (type 5)  Height:   Ht Readings from Last 1 Encounters:  11/17/22 '5\' 7"'$  (1.702 m)    Weight:   Wt Readings from Last 1 Encounters:  11/21/22 67.4 kg   BMI:  Body mass index is 23.27 kg/m.  Estimated Nutritional Needs:   Kcal:  1850-2050  Protein:  85-105 grams  Fluid:  1.8-2.0 L  Clayborne Dana, RDN, LDN Clinical Nutrition

## 2022-11-26 DIAGNOSIS — F1010 Alcohol abuse, uncomplicated: Secondary | ICD-10-CM | POA: Diagnosis not present

## 2022-11-26 DIAGNOSIS — J9601 Acute respiratory failure with hypoxia: Secondary | ICD-10-CM | POA: Diagnosis not present

## 2022-11-26 DIAGNOSIS — Z720 Tobacco use: Secondary | ICD-10-CM | POA: Diagnosis not present

## 2022-11-26 DIAGNOSIS — E871 Hypo-osmolality and hyponatremia: Secondary | ICD-10-CM | POA: Diagnosis not present

## 2022-11-26 NOTE — Progress Notes (Signed)
PT Cancellation Note  Patient Details Name: Ronald [name] MRN: [id number] DOB: [birth year]   Cancelled Treatment:    Reason Eval/Treat Not Completed: Patient declined, no reason specified  3rd attempt to see patient for physical therapy follow-up today. Pt requested return afternoon after 1st attempt, wanted to wait until after dressing chang 2nd attempt, and stating he is going to eat lunch first on 3rd attempt. Encouraged OOB with therapy to recliner to sit up, and eat lunch but declined. Will continue to follow and work with patient as he is willing.  Candie Mile, PT, DPT Physical Therapist Acute Rehabilitation Services [facility] [place] [place] 11/26/2022, 1:49 PM

## 2022-11-26 NOTE — TOC Progression Note (Addendum)
Transition of Care [facility]) - Progression Note    Patient Details  Name: [name] MRN: [id number] Date of Birth: Dec 15, 1955  [facility] CM/SW Contact  Joanne Chars, LCSW Phone Number: 11/26/2022, 3:24 PM  Clinical Narrative:   Per PT note, pt declined to work with PT 3x today.  CSW spoke with pt sister [name] regarding home discharge options.  [name] reports that since pt has been at the hospital, they have boarded up the house, as it is "falling down from the inside."  [name] reports that no family member able to take pt in, but she is agreeable to talking with her sisters and coming to the hospital next week to discuss a workable DC plan.  MD/TOC supervisors updated on this.    Pt does have disability income of approx $900 per month.   Expected Discharge Plan: [place] Barriers to Discharge: Continued Medical Work up  Expected Discharge Plan and Services In-house Referral: Clinical Social Work     Living arrangements for the past 2 months: Single Family Home                                       Social Determinants of Health (SDOH) Interventions SDOH Screenings   Food Insecurity: No Food Insecurity (10/17/2022)  Recent Concern: Food Insecurity - Food Insecurity Present (09/17/2022)  Housing: Medium Risk (10/17/2022)  Transportation Needs: Unmet Transportation Needs (10/17/2022)  Utilities: Not At Risk (10/17/2022)  Tobacco Use: High Risk (11/18/2022)    Readmission Risk Interventions     No data to display

## 2022-11-26 NOTE — Progress Notes (Signed)
Pt alert and oriented, respirations even and unlabored on room air. Dressing change to right BKA as per order. Staples intact with one area appearing to have open space not approximated well. Pain controlled by prn medication

## 2022-11-26 NOTE — Progress Notes (Signed)
[facility]  PROGRESS NOTE  [name] PX:[id number] DOB: 1956/09/07 DOA: 10/15/2022 PCP: Demetrios Isaacs, FNP   Brief HPI:   67 y.o. male past medical history of polysubstance abuse including alcohol cocaine, noncompliance right healed unstageable pressure ulcer peripheral vascular disease, recently discharged from the hospital home as he refused SNF comes into the hospital for acute encephalopathy, hypoxic and hypercarbic noted to be aspirating, required intubation admitted to the ICU extubated on 10/16/2022 transferred to [facility] 10[facility]18/2022 currently awaiting skilled nursing facility placement. MRI of the foot show large ulceration and underlying osteomyelitis no abscess.     Subjective   .  Patient seen and examined, denies any complaints   Assessment/Plan:     Unstageable right heel pressure ulcer with early osteomyelitis: Orthopedic surgery was consulted and performed amputation on 11/17/2022, wound VAC now removed on wet-to-dry dressing changes. Now afebrile, cont keflex. PT OT evaluated the patient, awaiting skilled nursing facility placement.   Acute respiratory failure with hypoxia and hypercarbia: Has been wean to room air. KVO all IV  fluids patient is tolerating his diet.   Proteus mirabilis pneumonia -Complete course with Augmentin   Acute on chronic hyponatremia in the setting of beer potomania: Has returned to his baseline of 130-1 34.   Dysphagia: Tolerating a dysphagia 3 diet.   Electrolyte imbalance hypokalemia/hypomagnesemia: Monitor and replete as indicated.   Right jaw pain: CT scan showed no abscess.   Prediabetes mellitus type II: With an A1c of 5.6. Continue to monitor CBGs intermittently. -CBG well-controlled   Chronic alcohol abuse/cocaine abuse: Continue folic acid thiamine and multivitamins no signs of withdrawal.   Peripheral vascular disease: History of left BKA.   Right elbow swelling cellulitis versus  thrombophlebitis: Noted.   Stage II ischial tuberosity pressure ulcer present on admission RN Pressure Injury Documentation:     Pressure Injury 10/15/22 Ischial tuberosity Left;Posterior;Proximal Stage 2 -  Partial thickness loss of dermis presenting as a shallow open injury with a red, pink wound bed without slough. (Active)  10/15/22 2000  Location: Ischial tuberosity  Location Orientation: Left;Posterior;Proximal  Staging: Stage 2 -  Partial thickness loss of dermis presenting as a shallow open injury with a red, pink wound bed without slough.  Wound Description (Comments):   Present on Admission:   Dressing Type Foam - Lift dressing to assess site every shift 11/10/22 0900     Pressure Injury 10/15/22 Buttocks Mid;Upper Stage 1 -  Intact skin with non-blanchable redness of a localized area usually over a bony prominence. (Active)  10/15/22 1100  Location: Buttocks  Location Orientation: Mid;Upper  Staging: Stage 1 -  Intact skin with non-blanchable redness of a localized area usually over a bony prominence.  Wound Description (Comments):   Present on Admission:   Dressing Type Foam - Lift dressing to assess site every shift 11/22/22 0930      Medications     vitamin C  1,000 mg Oral Daily   cephALEXin  500 mg Oral Q8H   docusate sodium  100 mg Oral Daily   feeding supplement (NEPRO CARB STEADY)  237 mL Oral Q24H   fentaNYL (SUBLIMAZE) injection  50 mcg Intravenous Once   fluticasone furoate-vilanterol  1 puff Inhalation Daily   folic acid  1 mg Oral Daily   heparin  5,000 Units Subcutaneous Q8H   magnesium oxide  400 mg Oral Daily   metoprolol tartrate  50 mg Oral BID   multivitamin with minerals  1 tablet Oral Daily  nutrition supplement (JUVEN)  1 packet Oral BID BM   pantoprazole  40 mg Oral Daily   polyethylene glycol  17 g Oral Daily   sodium hypochlorite   Irrigation BID   thiamine  100 mg Oral Daily   zinc sulfate  220 mg Oral Daily     Data Reviewed:    CBG:  No results for input(s): "GLUCAP" in the last 168 hours.  SpO2: 98 % O2 Flow Rate (L/min): 2 L/min FiO2 (%): 40 %    Vitals:   11/26/22 0422 11/26/22 0700 11/26/22 0828 11/26/22 0842  BP: (!) 132/90  127/79   Pulse: 83  81   Resp:   18   Temp: 98.7 F (37.1 C)     TempSrc: Oral     SpO2: 97%  98% 98%  Weight:  65.7 kg    Height:          Data Reviewed:  Basic Metabolic Panel: No results for input(s): "NA", "K", "CL", "CO2", "GLUCOSE", "BUN", "CREATININE", "CALCIUM", "MG", "PHOS" in the last 168 hours.  CBC: No results for input(s): "WBC", "NEUTROABS", "HGB", "HCT", "MCV", "PLT" in the last 168 hours.  LFT No results for input(s): "AST", "ALT", "ALKPHOS", "BILITOT", "PROT", "ALBUMIN" in the last 168 hours.   Antibiotics: Anti-infectives (From admission, onward)    Start     Dose/Rate Route Frequency Ordered Stop   11/20/22 1000  amoxicillin-clavulanate (AUGMENTIN) 875-125 MG per tablet 1 tablet  Status:  Discontinued        1 tablet Oral Every 12 hours 11/20/22 0851 11/20/22 0852   11/20/22 0945  cephALEXin (KEFLEX) capsule 500 mg        500 mg Oral Every 8 hours 11/20/22 0853     11/17/22 2300  ceFAZolin (ANCEF) IVPB 2g/100 mL premix        2 g 200 mL/hr over 30 Minutes Intravenous Every 8 hours 11/17/22 1552 11/18/22 1056   11/17/22 1300  ceFAZolin (ANCEF) IVPB 2g/100 mL premix        2 g 200 mL/hr over 30 Minutes Intravenous On call to O.R. 11/17/22 0442 11/17/22 1440   11/15/22 1030  amoxicillin-clavulanate (AUGMENTIN) 875-125 MG per tablet 1 tablet        1 tablet Oral Every 12 hours 11/15/22 0931 11/18/22 0959   11/08/22 1000  amoxicillin-clavulanate (AUGMENTIN) 875-125 MG per tablet 1 tablet  Status:  Discontinued        1 tablet Oral Every 12 hours 11/08/22 0840 11/13/22 1107   11/06/22 1015  amoxicillin-clavulanate (AUGMENTIN) 875-125 MG per tablet 1 tablet  Status:  Discontinued        1 tablet Oral Every 12 hours 11/06/22 0916 11/08/22 0815    10/27/22 1030  amoxicillin-clavulanate (AUGMENTIN) 875-125 MG per tablet 1 tablet        1 tablet Oral Every 12 hours 10/27/22 1020 10/27/22 2224   10/20/22 1045  amoxicillin-clavulanate (AUGMENTIN) 875-125 MG per tablet 1 tablet  Status:  Discontinued        1 tablet Oral Every 12 hours 10/20/22 0959 10/27/22 1020   10/15/22 1000  Ampicillin-Sulbactam (UNASYN) 3 g in sodium chloride 0.9 % 100 mL IVPB        3 g 200 mL/hr over 30 Minutes Intravenous Every 6 hours 10/15/22 0949 10/20/22 0858        DVT prophylaxis: Heparin  Code Status: Full code  Family Communication: No family at bedside   CONSULTS    Objective    Physical  Examination:   General-appears in no acute distress Heart-S1-S2, regular, no murmur auscultated Lungs-clear to auscultation bilaterally, no wheezing or crackles auscultated Abdomen-soft, nontender, no organomegaly Extremities-status post left transtibial amputation, s/p right BKA Neuro-alert, oriented x3, no focal deficit noted   Status is: Inpatient:      Pressure Injury 10/15/22 Buttocks Mid;Upper Stage 1 -  Intact skin with non-blanchable redness of a localized area usually over a bony prominence. (Active)  10/15/22 1100  Location: Buttocks  Location Orientation: Mid;Upper  Staging: Stage 1 -  Intact skin with non-blanchable redness of a localized area usually over a bony prominence.  Wound Description (Comments):   Present on Admission:         [place]   [facility] If 7PM-7AM, please contact night-coverage at [email or web address], Office  [phone]   11/26/2022, 10:07 AM  LOS: 42 days

## 2022-11-26 NOTE — Plan of Care (Signed)
  Problem: Coping: Goal: Ability to adjust to condition or change in health will improve Outcome: Progressing   Problem: Fluid Volume: Goal: Ability to maintain a balanced intake and output will improve Outcome: Progressing   

## 2022-11-27 DIAGNOSIS — J9601 Acute respiratory failure with hypoxia: Secondary | ICD-10-CM | POA: Diagnosis not present

## 2022-11-27 DIAGNOSIS — Z720 Tobacco use: Secondary | ICD-10-CM | POA: Diagnosis not present

## 2022-11-27 DIAGNOSIS — E871 Hypo-osmolality and hyponatremia: Secondary | ICD-10-CM | POA: Diagnosis not present

## 2022-11-27 DIAGNOSIS — F1010 Alcohol abuse, uncomplicated: Secondary | ICD-10-CM | POA: Diagnosis not present

## 2022-11-27 NOTE — Progress Notes (Signed)
[facility]  PROGRESS NOTE  [name] DOB: 1956/02/05 DOA: 10/15/2022 PCP: Demetrios Isaacs, FNP   Brief HPI:   67 y.o. male past medical history of polysubstance abuse including alcohol cocaine, noncompliance right healed unstageable pressure ulcer peripheral vascular disease, recently discharged from the hospital home as he refused SNF comes into the hospital for acute encephalopathy, hypoxic and hypercarbic noted to be aspirating, required intubation admitted to the ICU extubated on 10/16/2022 transferred to [facility] on 10/18/2022 currently awaiting skilled nursing facility placement. MRI of the foot show large ulceration and underlying osteomyelitis no abscess.     Subjective   Patient seen and examined, denies any new complaints.   Assessment/Plan:     Unstageable right heel pressure ulcer with early osteomyelitis: Orthopedic surgery was consulted and performed amputation on 11/17/2022, wound VAC now removed on wet-to-dry dressing changes. Now afebrile, cont keflex. PT OT evaluated the patient, awaiting skilled nursing facility placement.   Acute respiratory failure with hypoxia and hypercarbia: Has been wean to room air. KVO all IV  fluids patient is tolerating his diet.   Proteus mirabilis pneumonia -Complete course with Augmentin   Acute on chronic hyponatremia in the setting of beer potomania: Has returned to his baseline of 130-1 34.   Dysphagia: Tolerating a dysphagia 3 diet.   Electrolyte imbalance hypokalemia/hypomagnesemia: Monitor and replete as indicated.   Right jaw pain: CT scan showed no abscess.   Prediabetes mellitus type II: With an A1c of 5.6. Continue to monitor CBGs intermittently. -CBG well-controlled   Chronic alcohol abuse/cocaine abuse: Continue folic acid thiamine and multivitamins no signs of withdrawal.   Peripheral vascular disease: History of left BKA.   Right elbow swelling cellulitis versus  thrombophlebitis: Noted.   Stage II ischial tuberosity pressure ulcer present on admission RN Pressure Injury Documentation:     Pressure Injury 10/15/22 Ischial tuberosity Left;Posterior;Proximal Stage 2 -  Partial thickness loss of dermis presenting as a shallow open injury with a red, pink wound bed without slough. (Active)  10/15/22 2000  Location: Ischial tuberosity  Location Orientation: Left;Posterior;Proximal  Staging: Stage 2 -  Partial thickness loss of dermis presenting as a shallow open injury with a red, pink wound bed without slough.  Wound Description (Comments):   Present on Admission:   Dressing Type Foam - Lift dressing to assess site every shift 11/10/22 0900     Pressure Injury 10/15/22 Buttocks Mid;Upper Stage 1 -  Intact skin with non-blanchable redness of a localized area usually over a bony prominence. (Active)  10/15/22 1100  Location: Buttocks  Location Orientation: Mid;Upper  Staging: Stage 1 -  Intact skin with non-blanchable redness of a localized area usually over a bony prominence.  Wound Description (Comments):   Present on Admission:   Dressing Type Foam - Lift dressing to assess site every shift 11/22/22 0930      Medications     vitamin C  1,000 mg Oral Daily   cephALEXin  500 mg Oral Q8H   docusate sodium  100 mg Oral Daily   feeding supplement (NEPRO CARB STEADY)  237 mL Oral Q24H   fentaNYL (SUBLIMAZE) injection  50 mcg Intravenous Once   fluticasone furoate-vilanterol  1 puff Inhalation Daily   folic acid  1 mg Oral Daily   heparin  5,000 Units Subcutaneous Q8H   magnesium oxide  400 mg Oral Daily   metoprolol tartrate  50 mg Oral BID   multivitamin with minerals  1 tablet Oral Daily  nutrition supplement (JUVEN)  1 packet Oral BID BM   pantoprazole  40 mg Oral Daily   polyethylene glycol  17 g Oral Daily   sodium hypochlorite   Irrigation BID   thiamine  100 mg Oral Daily   zinc sulfate  220 mg Oral Daily     Data Reviewed:    CBG:  No results for input(s): "GLUCAP" in the last 168 hours.  SpO2: 98 % O2 Flow Rate (L/min): 2 L/min FiO2 (%): 40 %    Vitals:   11/27/22 0419 11/27/22 0500 11/27/22 0822 11/27/22 0830  BP: 126/82  113/72   Pulse: 82  86   Resp: 16  17   Temp: 98.3 F (36.8 C)  98.8 F (37.1 C)   TempSrc:   Oral   SpO2: 96%  98% 98%  Weight:  66.2 kg    Height:          Data Reviewed:  Basic Metabolic Panel: No results for input(s): "NA", "K", "CL", "CO2", "GLUCOSE", "BUN", "CREATININE", "CALCIUM", "MG", "PHOS" in the last 168 hours.  CBC: No results for input(s): "WBC", "NEUTROABS", "HGB", "HCT", "MCV", "PLT" in the last 168 hours.  LFT No results for input(s): "AST", "ALT", "ALKPHOS", "BILITOT", "PROT", "ALBUMIN" in the last 168 hours.   Antibiotics: Anti-infectives (From admission, onward)    Start     Dose/Rate Route Frequency Ordered Stop   11/20/22 1000  amoxicillin-clavulanate (AUGMENTIN) 875-125 MG per tablet 1 tablet  Status:  Discontinued        1 tablet Oral Every 12 hours 11/20/22 0851 11/20/22 0852   11/20/22 0945  cephALEXin (KEFLEX) capsule 500 mg        500 mg Oral Every 8 hours 11/20/22 0853     11/17/22 2300  ceFAZolin (ANCEF) IVPB 2g/100 mL premix        2 g 200 mL/hr over 30 Minutes Intravenous Every 8 hours 11/17/22 1552 11/18/22 1056   11/17/22 1300  ceFAZolin (ANCEF) IVPB 2g/100 mL premix        2 g 200 mL/hr over 30 Minutes Intravenous On call to O.R. 11/17/22 0442 11/17/22 1440   11/15/22 1030  amoxicillin-clavulanate (AUGMENTIN) 875-125 MG per tablet 1 tablet        1 tablet Oral Every 12 hours 11/15/22 0931 11/18/22 0959   11/08/22 1000  amoxicillin-clavulanate (AUGMENTIN) 875-125 MG per tablet 1 tablet  Status:  Discontinued        1 tablet Oral Every 12 hours 11/08/22 0840 11/13/22 1107   11/06/22 1015  amoxicillin-clavulanate (AUGMENTIN) 875-125 MG per tablet 1 tablet  Status:  Discontinued        1 tablet Oral Every 12 hours 11/06/22 0916  11/08/22 0815   10/27/22 1030  amoxicillin-clavulanate (AUGMENTIN) 875-125 MG per tablet 1 tablet        1 tablet Oral Every 12 hours 10/27/22 1020 10/27/22 2224   10/20/22 1045  amoxicillin-clavulanate (AUGMENTIN) 875-125 MG per tablet 1 tablet  Status:  Discontinued        1 tablet Oral Every 12 hours 10/20/22 0959 10/27/22 1020   10/15/22 1000  Ampicillin-Sulbactam (UNASYN) 3 g in sodium chloride 0.9 % 100 mL IVPB        3 g 200 mL/hr over 30 Minutes Intravenous Every 6 hours 10/15/22 0949 10/20/22 0858        DVT prophylaxis: Heparin  Code Status: Full code  Family Communication: No family at bedside   CONSULTS    Objective  Physical Examination:   General-appears in no acute distress Heart-S1-S2, regular, no murmur auscultated Lungs-clear to auscultation bilaterally, no wheezing or crackles auscultated Abdomen-soft, nontender, no organomegaly Extremities-no edema in the lower extremities, s/p left BKA Neuro-alert, oriented x3, no focal deficit noted   Status is: Inpatient:      Pressure Injury 10/15/22 Buttocks Mid;Upper Stage 1 -  Intact skin with non-blanchable redness of a localized area usually over a bony prominence. (Active)  10/15/22 1100  Location: Buttocks  Location Orientation: Mid;Upper  Staging: Stage 1 -  Intact skin with non-blanchable redness of a localized area usually over a bony prominence.  Wound Description (Comments):   Present on Admission:         [place]   [facility] If 7PM-7AM, please contact night-coverage at [email or web address], Office  [phone]   11/27/2022, 12:27 PM  LOS: 43 days

## 2022-11-28 DIAGNOSIS — I739 Peripheral vascular disease, unspecified: Secondary | ICD-10-CM | POA: Diagnosis not present

## 2022-11-28 DIAGNOSIS — M86171 Other acute osteomyelitis, right ankle and foot: Secondary | ICD-10-CM | POA: Diagnosis not present

## 2022-11-28 NOTE — Progress Notes (Signed)
[facility]  PROGRESS NOTE  [name] PX:[id number] DOB: 08-27-1956 DOA: 10/15/2022 PCP: Demetrios Isaacs, FNP   Brief HPI:   67 y.o. male past medical history of polysubstance abuse including alcohol cocaine, noncompliance right healed unstageable pressure ulcer peripheral vascular disease, recently discharged from the hospital home as he refused SNF comes into the hospital for acute encephalopathy, hypoxic and hypercarbic noted to be aspirating, required intubation admitted to the ICU extubated on 10/16/2022 transferred to [facility] on 10/18/2022 currently awaiting skilled nursing facility placement. MRI of the foot show large ulceration and underlying osteomyelitis no abscess.     Subjective   Patient seen and examined, denies pain.   Assessment/Plan:     Unstageable right heel pressure ulcer with early osteomyelitis: Orthopedic surgery was consulted and performed amputation on 11/17/2022, wound VAC now removed on wet-to-dry dressing changes. Now afebrile, cont keflex. PT OT evaluated the patient, awaiting skilled nursing facility placement.   Acute respiratory failure with hypoxia and hypercarbia: Has been weaned to room air. KVO all IV  fluids patient is tolerating his diet.   Proteus mirabilis pneumonia -Completed course with Augmentin   Acute on chronic hyponatremia in the setting of beer potomania: Has returned to his baseline of 130-1 34.   Dysphagia: Tolerating a dysphagia 3 diet.   Electrolyte imbalance hypokalemia/hypomagnesemia: Monitor and replete as indicated.   Right jaw pain: CT scan showed no abscess.   Prediabetes mellitus type II: With an A1c of 5.6. Continue to monitor CBGs intermittently. -CBG well-controlled   Chronic alcohol abuse/cocaine abuse: Continue folic acid thiamine and multivitamins no signs of withdrawal.   Peripheral vascular disease: History of left BKA.   Right elbow swelling cellulitis versus thrombophlebitis: Noted.    Stage II ischial tuberosity pressure ulcer present on admission RN Pressure Injury Documentation:     Pressure Injury 10/15/22 Ischial tuberosity Left;Posterior;Proximal Stage 2 -  Partial thickness loss of dermis presenting as a shallow open injury with a red, pink wound bed without slough. (Active)  10/15/22 2000  Location: Ischial tuberosity  Location Orientation: Left;Posterior;Proximal  Staging: Stage 2 -  Partial thickness loss of dermis presenting as a shallow open injury with a red, pink wound bed without slough.  Wound Description (Comments):   Present on Admission:   Dressing Type Foam - Lift dressing to assess site every shift 11/10/22 0900     Pressure Injury 10/15/22 Buttocks Mid;Upper Stage 1 -  Intact skin with non-blanchable redness of a localized area usually over a bony prominence. (Active)  10/15/22 1100  Location: Buttocks  Location Orientation: Mid;Upper  Staging: Stage 1 -  Intact skin with non-blanchable redness of a localized area usually over a bony prominence.  Wound Description (Comments):   Present on Admission:   Dressing Type Foam - Lift dressing to assess site every shift 11/22/22 0930      Medications     vitamin C  1,000 mg Oral Daily   cephALEXin  500 mg Oral Q8H   docusate sodium  100 mg Oral Daily   feeding supplement (NEPRO CARB STEADY)  237 mL Oral Q24H   fentaNYL (SUBLIMAZE) injection  50 mcg Intravenous Once   fluticasone furoate-vilanterol  1 puff Inhalation Daily   folic acid  1 mg Oral Daily   heparin  5,000 Units Subcutaneous Q8H   magnesium oxide  400 mg Oral Daily   metoprolol tartrate  50 mg Oral BID   multivitamin with minerals  1 tablet Oral Daily   nutrition supplement (  JUVEN)  1 packet Oral BID BM   pantoprazole  40 mg Oral Daily   polyethylene glycol  17 g Oral Daily   sodium hypochlorite   Irrigation BID   thiamine  100 mg Oral Daily   zinc sulfate  220 mg Oral Daily     Data Reviewed:   CBG:  No results for  input(s): "GLUCAP" in the last 168 hours.  SpO2: 98 % O2 Flow Rate (L/min): 2 L/min FiO2 (%): 40 %    Vitals:   11/27/22 0500 11/27/22 0822 11/27/22 0830 11/28/22 0814  BP:  113/72  115/64  Pulse:  86  83  Resp:  17  18  Temp:  98.8 F (37.1 C)  98.5 F (36.9 C)  TempSrc:  Oral  Oral  SpO2:  98% 98% 98%  Weight: 66.2 kg     Height:          Data Reviewed:  Basic Metabolic Panel: No results for input(s): "NA", "K", "CL", "CO2", "GLUCOSE", "BUN", "CREATININE", "CALCIUM", "MG", "PHOS" in the last 168 hours.  CBC: No results for input(s): "WBC", "NEUTROABS", "HGB", "HCT", "MCV", "PLT" in the last 168 hours.  LFT No results for input(s): "AST", "ALT", "ALKPHOS", "BILITOT", "PROT", "ALBUMIN" in the last 168 hours.   Antibiotics: Anti-infectives (From admission, onward)    Start     Dose/Rate Route Frequency Ordered Stop   11/20/22 1000  amoxicillin-clavulanate (AUGMENTIN) 875-125 MG per tablet 1 tablet  Status:  Discontinued        1 tablet Oral Every 12 hours 11/20/22 0851 11/20/22 0852   11/20/22 0945  cephALEXin (KEFLEX) capsule 500 mg        500 mg Oral Every 8 hours 11/20/22 0853     11/17/22 2300  ceFAZolin (ANCEF) IVPB 2g/100 mL premix        2 g 200 mL/hr over 30 Minutes Intravenous Every 8 hours 11/17/22 1552 11/18/22 1056   11/17/22 1300  ceFAZolin (ANCEF) IVPB 2g/100 mL premix        2 g 200 mL/hr over 30 Minutes Intravenous On call to O.R. 11/17/22 0442 11/17/22 1440   11/15/22 1030  amoxicillin-clavulanate (AUGMENTIN) 875-125 MG per tablet 1 tablet        1 tablet Oral Every 12 hours 11/15/22 0931 11/18/22 0959   11/08/22 1000  amoxicillin-clavulanate (AUGMENTIN) 875-125 MG per tablet 1 tablet  Status:  Discontinued        1 tablet Oral Every 12 hours 11/08/22 0840 11/13/22 1107   11/06/22 1015  amoxicillin-clavulanate (AUGMENTIN) 875-125 MG per tablet 1 tablet  Status:  Discontinued        1 tablet Oral Every 12 hours 11/06/22 0916 11/08/22 0815    10/27/22 1030  amoxicillin-clavulanate (AUGMENTIN) 875-125 MG per tablet 1 tablet        1 tablet Oral Every 12 hours 10/27/22 1020 10/27/22 2224   10/20/22 1045  amoxicillin-clavulanate (AUGMENTIN) 875-125 MG per tablet 1 tablet  Status:  Discontinued        1 tablet Oral Every 12 hours 10/20/22 0959 10/27/22 1020   10/15/22 1000  Ampicillin-Sulbactam (UNASYN) 3 g in sodium chloride 0.9 % 100 mL IVPB        3 g 200 mL/hr over 30 Minutes Intravenous Every 6 hours 10/15/22 0949 10/20/22 0858        DVT prophylaxis: Heparin  Code Status: Full code  Family Communication: No family at bedside   CONSULTS    Objective    Physical  Examination:  General-appears in no acute distress Heart-S1-S2, regular, no murmur auscultated Lungs-clear to auscultation bilaterally, no wheezing or crackles auscultated Abdomen-soft, nontender, no organomegaly Extremities-bilateral BKA Neuro-alert, oriented x3, no focal deficit noted   Status is: Inpatient:      Pressure Injury 10/15/22 Buttocks Mid;Upper Stage 1 -  Intact skin with non-blanchable redness of a localized area usually over a bony prominence. (Active)  10/15/22 1100  Location: Buttocks  Location Orientation: Mid;Upper  Staging: Stage 1 -  Intact skin with non-blanchable redness of a localized area usually over a bony prominence.  Wound Description (Comments):   Present on Admission:         [place]   [facility] If 7PM-7AM, please contact night-coverage at [email or web address], Office  [phone]   11/28/2022, 10:27 AM  LOS: 44 days

## 2022-11-29 DIAGNOSIS — I739 Peripheral vascular disease, unspecified: Secondary | ICD-10-CM | POA: Diagnosis not present

## 2022-11-29 DIAGNOSIS — M86171 Other acute osteomyelitis, right ankle and foot: Secondary | ICD-10-CM | POA: Diagnosis not present

## 2022-11-29 DIAGNOSIS — J9601 Acute respiratory failure with hypoxia: Secondary | ICD-10-CM | POA: Diagnosis not present

## 2022-11-29 DIAGNOSIS — J1569 Pneumonia due to other gram-negative bacteria: Secondary | ICD-10-CM | POA: Diagnosis not present

## 2022-11-29 NOTE — Progress Notes (Signed)
[facility]  PROGRESS NOTE  [name] PX:[id number] DOB: [birth year] DOA: 10/15/2022 PCP: Demetrios Isaacs, FNP   Brief HPI:   67 y.o. male past medical history of polysubstance abuse including alcohol cocaine, noncompliance right healed unstageable pressure ulcer peripheral vascular disease, recently discharged from the hospital home as he refused SNF comes into the hospital for acute encephalopathy, hypoxic and hypercarbic noted to be aspirating, required intubation admitted to the ICU extubated on 10/16/2022 transferred to [facility] 10[facility]18/2022 currently awaiting skilled nursing facility placement. MRI of the foot show large ulceration and underlying osteomyelitis no abscess.     Subjective   Patient seen, denies shortness of breath or chest pain.  Awaiting bed at skilled nursing facility.   Assessment/Plan:     Unstageable right heel pressure ulcer with early osteomyelitis: Orthopedic surgery was consulted and performed amputation on 11/17/2022, wound VAC now removed on wet-to-dry dressing changes. Now afebrile, cont keflex. PT OT evaluated the patient, awaiting skilled nursing facility placement.   Acute respiratory failure with hypoxia and hypercarbia: Has been weaned to room air. KVO all IV  fluids patient is tolerating his diet.   Proteus mirabilis pneumonia -Completed course with Augmentin   Acute on chronic hyponatremia in the setting of beer potomania: Has returned to his baseline of 130-1 34.   Dysphagia: Tolerating a dysphagia 3 diet.   Electrolyte imbalance hypokalemia/hypomagnesemia: Monitor and replete as indicated.   Right jaw pain: CT scan showed no abscess.   Prediabetes mellitus type II: With an A1c of 5.6. Continue to monitor CBGs intermittently. -CBG well-controlled   Chronic alcohol abuse/cocaine abuse: Continue folic acid thiamine and multivitamins no signs of withdrawal.   Peripheral vascular disease: History of left BKA.   Right elbow  swelling cellulitis versus thrombophlebitis: Noted.   Stage II ischial tuberosity pressure ulcer present on admission RN Pressure Injury Documentation:     Pressure Injury 10/15/22 Ischial tuberosity Left;Posterior;Proximal Stage 2 -  Partial thickness loss of dermis presenting as a shallow open injury with a red, pink wound bed without slough. (Active)  10/15/22 2000  Location: Ischial tuberosity  Location Orientation: Left;Posterior;Proximal  Staging: Stage 2 -  Partial thickness loss of dermis presenting as a shallow open injury with a red, pink wound bed without slough.  Wound Description (Comments):   Present on Admission:   Dressing Type Foam - Lift dressing to assess site every shift 11/10/22 0900     Pressure Injury 10/15/22 Buttocks Mid;Upper Stage 1 -  Intact skin with non-blanchable redness of a localized area usually over a bony prominence. (Active)  10/15/22 1100  Location: Buttocks  Location Orientation: Mid;Upper  Staging: Stage 1 -  Intact skin with non-blanchable redness of a localized area usually over a bony prominence.  Wound Description (Comments):   Present on Admission:   Dressing Type Foam - Lift dressing to assess site every shift 11/22/22 0930      Medications     vitamin C  1,000 mg Oral Daily   cephALEXin  500 mg Oral Q8H   docusate sodium  100 mg Oral Daily   feeding supplement (NEPRO CARB STEADY)  237 mL Oral Q24H   fentaNYL (SUBLIMAZE) injection  50 mcg Intravenous Once   fluticasone furoate-vilanterol  1 puff Inhalation Daily   folic acid  1 mg Oral Daily   heparin  5,000 Units Subcutaneous Q8H   magnesium oxide  400 mg Oral Daily   metoprolol tartrate  50 mg Oral BID   multivitamin with  minerals  1 tablet Oral Daily   nutrition supplement (JUVEN)  1 packet Oral BID BM   pantoprazole  40 mg Oral Daily   polyethylene glycol  17 g Oral Daily   sodium hypochlorite   Irrigation BID   thiamine  100 mg Oral Daily   zinc sulfate  220 mg Oral  Daily     Data Reviewed:   CBG:  No results for input(s): "GLUCAP" in the last 168 hours.  SpO2: 94 % O2 Flow Rate (L/min): 2 L/min FiO2 (%): 40 %    Vitals:   11/28/22 0814 11/28/22 1631 11/28/22 2200 11/29/22 0837  BP: 115/64 119/68 128/74 (!) 135/97  Pulse: 83 83 96 90  Resp: 18 20 20 18   Temp: 98.5 F (36.9 C) 98 F (36.7 C) 98.6 F (37 C) 97.8 F (36.6 C)  TempSrc: Oral Oral Oral Oral  SpO2: 98% 98% 94% 94%  Weight:      Height:          Data Reviewed:  Basic Metabolic Panel: No results for input(s): "NA", "K", "CL", "CO2", "GLUCOSE", "BUN", "CREATININE", "CALCIUM", "MG", "PHOS" in the last 168 hours.  CBC: No results for input(s): "WBC", "NEUTROABS", "HGB", "HCT", "MCV", "PLT" in the last 168 hours.  LFT No results for input(s): "AST", "ALT", "ALKPHOS", "BILITOT", "PROT", "ALBUMIN" in the last 168 hours.   Antibiotics: Anti-infectives (From admission, onward)    Start     Dose/Rate Route Frequency Ordered Stop   11/20/22 1000  amoxicillin-clavulanate (AUGMENTIN) 875-125 MG per tablet 1 tablet  Status:  Discontinued        1 tablet Oral Every 12 hours 11/20/22 0851 11/20/22 0852   11/20/22 0945  cephALEXin (KEFLEX) capsule 500 mg        500 mg Oral Every 8 hours 11/20/22 0853     11/17/22 2300  ceFAZolin (ANCEF) IVPB 2g/100 mL premix        2 g 200 mL/hr over 30 Minutes Intravenous Every 8 hours 11/17/22 1552 11/18/22 1056   11/17/22 1300  ceFAZolin (ANCEF) IVPB 2g/100 mL premix        2 g 200 mL/hr over 30 Minutes Intravenous On call to O.R. 11/17/22 0442 11/17/22 1440   11/15/22 1030  amoxicillin-clavulanate (AUGMENTIN) 875-125 MG per tablet 1 tablet        1 tablet Oral Every 12 hours 11/15/22 0931 11/18/22 0959   11/08/22 1000  amoxicillin-clavulanate (AUGMENTIN) 875-125 MG per tablet 1 tablet  Status:  Discontinued        1 tablet Oral Every 12 hours 11/08/22 0840 11/13/22 1107   11/06/22 1015  amoxicillin-clavulanate (AUGMENTIN) 875-125 MG per  tablet 1 tablet  Status:  Discontinued        1 tablet Oral Every 12 hours 11/06/22 0916 11/08/22 0815   10/27/22 1030  amoxicillin-clavulanate (AUGMENTIN) 875-125 MG per tablet 1 tablet        1 tablet Oral Every 12 hours 10/27/22 1020 10/27/22 2224   10/20/22 1045  amoxicillin-clavulanate (AUGMENTIN) 875-125 MG per tablet 1 tablet  Status:  Discontinued        1 tablet Oral Every 12 hours 10/20/22 0959 10/27/22 1020   10/15/22 1000  Ampicillin-Sulbactam (UNASYN) 3 g in sodium chloride 0.9 % 100 mL IVPB        3 g 200 mL/hr over 30 Minutes Intravenous Every 6 hours 10/15/22 0949 10/20/22 0858        DVT prophylaxis: Heparin  Code Status: Full code  Family  Communication: No family at bedside   CONSULTS    Objective    Physical Examination:  General-appears in no acute distress Heart-S1-S2, regular, no murmur auscultated Lungs-clear to auscultation bilaterally, no wheezing or crackles auscultated Abdomen-soft, nontender, no organomegaly Extremities-bilateral BKA Neuro-alert, oriented x3, no focal deficit noted   Status is: Inpatient:      Pressure Injury 10/15/22 Buttocks Mid;Upper Stage 1 -  Intact skin with non-blanchable redness of a localized area usually over a bony prominence. (Active)  10/15/22 1100  Location: Buttocks  Location Orientation: Mid;Upper  Staging: Stage 1 -  Intact skin with non-blanchable redness of a localized area usually over a bony prominence.  Wound Description (Comments):   Present on Admission:         [place]   [facility] If 7PM-7AM, please contact night-coverage at [email or web address], Office  [phone]   11/29/2022, 10:20 AM  LOS: 45 days

## 2022-11-29 NOTE — Progress Notes (Signed)
Physical Therapy Treatment Patient Details Name: Ronald [name] MRN: [id number] DOB: 12/28/1955 Today's Date: 11/29/2022   History of Present Illness This is a 67 year old gentleman admitted 2/2 after being found down at home after a recent d/c from hospital where he refused SNF. Pt was hyponatremic hypoxemic hypercarbic was placed on BiPAP in the emergency room. Ultimately failed BiPAP requiring intubation mechanical ventilation 2/2--2/3. 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 PMH: polysubstance abuse, cocaine, alcohol use.    PT Comments    Pt was received supine and agreeable to session with encouragement. Pt was initially agitated, however became calm and focused on exercises once sitting EOB. Session focused on improving sitting balance with pt able to reach slightly outside BOS and complete seated exercises. When reaching to therapist's hand, pt required cues to remain focused on target. Pt would reach almost to therapists hand and then stop, however when cued could reach to touch it. Pt demonstrated tendency to lean back due to fear of falling despite cues for anterior weight shift. Pt stated that he liked the reaching exercise. Pt continues to benefit from PT services to progress toward functional mobility goals.     Recommendations for follow up therapy are one component of a multi-disciplinary discharge planning process, led by the attending physician.  Recommendations may be updated based on patient status, additional functional criteria and insurance authorization.  Follow Up Recommendations  Skilled nursing-short term rehab (<3 hours/day) Can patient physically be transported by private vehicle: No   Assistance Recommended at Discharge Frequent or constant Supervision/Assistance  Patient can return home with the following A lot of help with bathing/dressing/bathroom;Assistance with cooking/housework;Direct supervision/assist for medications management;Direct supervision/assist for financial  management;Assist for transportation;Help with stairs or ramp for entrance;Two people to help with walking and/or transfers   Equipment Recommendations  Wheelchair (measurements PT);Wheelchair cushion (measurements PT)    Recommendations for Other Services       Precautions / Restrictions Precautions Precautions: Fall Precaution Comments: 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) . New Rt BKA Restrictions Weight Bearing Restrictions: No RLE Weight Bearing: Non weight bearing     Mobility  Bed Mobility Overal bed mobility: Needs Assistance Bed Mobility: Supine to Sit, Sit to Supine     Supine to sit: HOB elevated, Supervision Sit to supine: Supervision, HOB elevated   General bed mobility comments: Pt able to complete bed mobility without physical assist. Supervision for safety. Pt able to sit EOB for ~8 mins       Balance Overall balance assessment: Needs assistance Sitting-balance support: Bilateral upper extremity supported Sitting balance-Leahy Scale: Fair Sitting balance - Comments: sitting EOB with intermittent single or no UE support. Pt demonstrated mild posterior LOB with no UE support, however pt able to correct without assist.                                    Cognition Arousal/Alertness: Awake/alert Behavior During Therapy: Agitated Overall Cognitive Status: Within Functional Limits for tasks assessed                                 General Comments: Pt beginning the session agitated, however becoming more calm when performing EOB exercises        Exercises General Exercises - Lower Extremity Long Arc Quad: AROM, Seated, 5 reps, Both Hip Flexion/Marching: AROM, 10 reps, Seated, Both Other Exercises Other  Exercises: Reaching outside BOS with BUEs    General Comments        Pertinent Vitals/Pain Pain Assessment Pain Assessment: Faces Faces Pain Scale: Hurts even more Pain Location: Rt residual limb Pain Descriptors /  Indicators: Grimacing, Guarding Pain Intervention(s): Limited activity within patient's tolerance, Monitored during session, Repositioned     PT Goals (current goals can now be found in the care plan section) Acute Rehab PT Goals Patient Stated Goal: none stated PT Goal Formulation: With patient Time For Goal Achievement: 12/02/22 Potential to Achieve Goals: Fair Progress towards PT goals: Progressing toward goals    Frequency    Min 3X/week      PT Plan Current plan remains appropriate       AM-PAC PT "6 Clicks" Mobility   Outcome Measure  Help needed turning from your back to your side while in a flat bed without using bedrails?: A Little Help needed moving from lying on your back to sitting on the side of a flat bed without using bedrails?: A Little Help needed moving to and from a bed to a chair (including a wheelchair)?: Total Help needed standing up from a chair using your arms (e.g., wheelchair or bedside chair)?: Total Help needed to walk in hospital room?: Total Help needed climbing 3-5 steps with a railing? : Total 6 Click Score: 10    End of Session   Activity Tolerance: Patient tolerated treatment well Patient left: in bed;with call bell/phone within reach Nurse Communication: Mobility status PT Visit Diagnosis: Other abnormalities of gait and mobility (R26.89);Muscle weakness (generalized) (M62.81);History of falling (Z91.81);Pain;Difficulty in walking, not elsewhere classified (R26.2)     Time: [id number] PT Time Calculation (min) (ACUTE ONLY): 16 min  Charges:  $Therapeutic Exercise: 8-22 mins                     Michelle Nasuti, PTA Acute Rehabilitation Services Secure Chat Preferred  [phone]    Michelle Nasuti 11/29/2022, 4:42 PM

## 2022-11-30 DIAGNOSIS — I739 Peripheral vascular disease, unspecified: Secondary | ICD-10-CM | POA: Diagnosis not present

## 2022-11-30 DIAGNOSIS — J9601 Acute respiratory failure with hypoxia: Secondary | ICD-10-CM | POA: Diagnosis not present

## 2022-11-30 DIAGNOSIS — J1569 Pneumonia due to other gram-negative bacteria: Secondary | ICD-10-CM | POA: Diagnosis not present

## 2022-11-30 DIAGNOSIS — M86171 Other acute osteomyelitis, right ankle and foot: Secondary | ICD-10-CM | POA: Diagnosis not present

## 2022-11-30 NOTE — Progress Notes (Signed)
Occupational Therapy Treatment Note - late entry    11/25/22 1600  OT Visit Information  Last OT Received On 11/25/22  Assistance Needed +2  OT goals addressed during session Proper use of Adaptive equipment and DME;Strengthening/ROM  History of Present Illness This is a 67 year old gentleman admitted 2/2 after being found down at home after a recent d/c from hospital where he refused SNF. Pt was hyponatremic hypoxemic hypercarbic was placed on BiPAP in the emergency room. Ultimately failed BiPAP requiring intubation mechanical ventilation 2/2--2/3. 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 PMH: polysubstance abuse, cocaine, alcohol use.  Precautions  Precautions Fall  Precaution Comments 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) . New Rt BKA  Restrictions  Weight Bearing Restrictions Yes  RLE Weight Bearing NWB  Pain Assessment  Pain Assessment 0-10  Pain Score 7  Faces Pain Scale 6  Cognition  Arousal/Alertness Awake/alert  Behavior During Therapy Agitated  Overall Cognitive Status Within Functional Limits for tasks assessed  Area of Impairment Safety/judgement;Following commands;Attention  Current Attention Level Selective  Safety/Judgement Decreased awareness of safety  General Comments Pt became agitated with increased pain and when asked to complete activities  Upper Extremity Assessment  Upper Extremity Assessment Generalized weakness  Bed Mobility  Overal bed mobility Needs Assistance  Bed Mobility Rolling;Supine to Sit  Rolling Mod assist  Supine to sit HOB elevated;Supervision  Sit to supine Max assist (for scotting L/R)  General bed mobility comments Mod A for rolling due to increased pain. Cues for use of handrail  Transfers  Overall transfer level Needs assistance  Equipment used Sliding board  Transfers Bed to chair/wheelchair/BSC  Bed to/from chair/wheelchair/BSC transfer type: Lateral/scoot transfer   Lateral/Scoot Transfers Mod assist;With slide board;From elevated surface;+2 physical  assistance  General transfer comment Pt. agitated when asked to perform sliding board t/f, but agreed to attempt. Pt c/o severe RLE pain at surgical site. refused to spend any amount of time in chair, went straight back to bed.  Balance  Sitting-balance support Bilateral upper extremity supported  Sitting balance-Leahy Scale Fair  Exercises  Exercises General Upper Extremity  General Exercises - Upper Extremity  Shoulder Horizontal ABduction 15 reps;Strengthening;Theraband  Theraband Level (Shoulder Horizontal Abduction) Level 2 (Red)  Elbow Flexion 20 reps;Theraband;Strengthening  Theraband Level (Elbow Flexion) Level 2 (Red)  Elbow Extension 20 reps;Theraband;Strengthening  Theraband Level (Elbow Extension) Level 2 (Red)  Shoulder Exercises  Shoulder External Rotation 20 reps;Theraband  Theraband Level (Shoulder External Rotation) Level 2 (Red)  OT - End of Session  Nurse Communication Mobility status  OT Assessment/Plan  OT Plan Discharge plan remains appropriate  OT Visit Diagnosis Unsteadiness on feet (R26.81);Other abnormalities of gait and mobility (R26.89);Muscle weakness (generalized) (M62.81)  OT Frequency (ACUTE ONLY) Min 2X/week  Follow Up Recommendations Skilled nursing-short term rehab (<3 hours/day)  Assistance recommended at discharge Frequent or constant Supervision/Assistance  Patient can return home with the following A little help with walking and/or transfers;A little help with bathing/dressing/bathroom;Assistance with cooking/housework;Direct supervision/assist for financial management;Direct supervision/assist for medications management;Help with stairs or ramp for entrance  AM-PAC OT "6 Clicks" Daily Activity Outcome Measure (Version 2)  Help from another person eating meals? 4  Help from another person taking care of personal grooming? 3  Help from another person toileting, which includes using toliet, bedpan, or urinal? 2  Help from another person bathing  (including washing, rinsing, drying)? 2  Help from another person to put on and taking off regular upper body clothing? 3  Help from another person  to put on and taking off regular lower body clothing? 2  6 Click Score 16  Progressive Mobility  What is the highest level of mobility based on the progressive mobility assessment? Level 2 (Chairfast) - Balance while sitting on edge of bed and cannot stand  OT Goal Progression  Progress towards OT goals Progressing toward goals  Acute Rehab OT Goals  OT Goal Formulation With patient  Time For Goal Achievement 12/01/22  Potential to Achieve Goals Fair  ADL Goals  Pt Will Perform Lower Body Dressing with supervision;sitting/lateral leans  Pt Will Transfer to Toilet with min assist;anterior/posterior transfer  Pt Will Perform Toileting - Clothing Manipulation and hygiene with supervision;sitting/lateral leans  Pt/caregiver will Perform Home Exercise Program Increased strength;Right Upper extremity;Left upper extremity;Both right and left upper extremity;With written HEP provided  Additional ADL Goal #1 Pt will transfer to wheelchair while maintaining WB precautions with min guard A.  OT Time Calculation  OT Start Time (ACUTE ONLY) 1535  OT Stop Time (ACUTE ONLY) 1605  OT Time Calculation (min) 30 min  OT General Charges  $OT Visit 1 Visit  OT Treatments  $Self Care/Home Management  8-22 mins  $Therapeutic Exercise 8-22 mins  Ronald [name], OTR/L

## 2022-11-30 NOTE — Progress Notes (Signed)
Occupational Therapy Treatment Patient Details Name: Ronald [name] MRN: [id number] DOB: 1956/01/27 Today's Date: 11/30/2022   History of present illness This is a 67 year old gentleman admitted 2/2 after being found down at home after a recent d/c from hospital where he refused SNF. Pt was hyponatremic hypoxemic hypercarbic was placed on BiPAP in the emergency room. Ultimately failed BiPAP requiring intubation mechanical ventilation 2/2--2/3. 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 PMH: polysubstance abuse, cocaine, alcohol use.   OT comments  Pt tolerated session well, significant decrease in pain to RLE observed as Pt able to perform mobility and t/fs with decrease c/o pain, minimal grimacing, etc.  Pt, however, continues to state pain is 9/10, but appears to be ~3/10, did not limit activities today.  Pt participated actively in ADLs and bed mobility/positoning, refused OOB activiites, t/f to chair, exercises, slightly agitated when asked to perform undesirable activities. PT would benefit from continued skilled OT to further instruct on t/fs as B BKAs will require significant training on compensatory and modified strategies to perform ADLs, t/fs, bed mobility, and safety precautions.   Recommendations for follow up therapy are one component of a multi-disciplinary discharge planning process, led by the attending physician.  Recommendations may be updated based on patient status, additional functional criteria and insurance authorization.    Follow Up Recommendations  Skilled nursing-short term rehab (<3 hours/day)     Assistance Recommended at Discharge Frequent or constant Supervision/Assistance  Patient can return home with the following  A little help with walking and/or transfers;A little help with bathing/dressing/bathroom;Assistance with cooking/housework;Direct supervision/assist for financial management;Direct supervision/assist for medications management;Help with stairs or ramp for entrance   Equipment  Recommendations       Recommendations for Other Services      Precautions / Restrictions Precautions Precautions: Fall Precaution Comments: 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) . New Rt BKA Restrictions Weight Bearing Restrictions: Yes RLE Weight Bearing: Non weight bearing       Mobility Bed Mobility Overal bed mobility: Needs Assistance Bed Mobility: Rolling Rolling: Supervision   Supine to sit: HOB elevated, Supervision Sit to supine: Supervision, HOB elevated   General bed mobility comments: PT able to perform scooting/rolling today with S for hand placement    Transfers Overall transfer level: Needs assistance                 General transfer comment: Pt. refused t/f today, pain is better than prior week     Balance Overall balance assessment: Needs assistance Sitting-balance support: Bilateral upper extremity supported Sitting balance-Leahy Scale: Fair Sitting balance - Comments: Pt. able to sit up in bed with B hands for support and HOB elevated, decreased complaints of RLE pain.                                   ADL either performed or assessed with clinical judgement   ADL Overall ADL's : Needs assistance/impaired Eating/Feeding: Set up   Grooming: Set up           Upper Body Dressing : Set up;Sitting   Lower Body Dressing: Set up;Sitting/lateral leans;Bed level   Toilet Transfer: +2 for physical assistance;Moderate assistance             General ADL Comments: Pt improving with LB dressing as RLE pain has decreased.    Extremity/Trunk Assessment Upper Extremity Assessment Upper Extremity Assessment: Overall WFL for tasks assessed   Lower Extremity Assessment  Lower Extremity Assessment: Defer to PT evaluation        Vision       Perception     Praxis      Cognition Arousal/Alertness: Awake/alert Behavior During Therapy: Agitated Overall Cognitive Status: Within Functional Limits for tasks assessed Area  of Impairment: Attention, Safety/judgement, Problem solving                   Current Attention Level: Selective   Following Commands: Follows one step commands with increased time, Follows one step commands inconsistently       General Comments: Pt becomes agitated with any requests and refuses OOB activities.        Exercises      Shoulder Instructions       General Comments      Pertinent Vitals/ Pain       Pain Assessment Pain Assessment: 0-10 Pain Score: 9  Faces Pain Scale: Hurts a little bit Breathing: occasional labored breathing, short period of hyperventilation  Home Living                                          Prior Functioning/Environment              Frequency  Min 2X/week        Progress Toward Goals  OT Goals(current goals can now be found in the care plan section)  Progress towards OT goals: Progressing toward goals  Acute Rehab OT Goals Patient Stated Goal: to decrease level of pain and return home OT Goal Formulation: With patient Time For Goal Achievement: 12/01/22 Potential to Achieve Goals: Fair ADL Goals Pt Will Perform Lower Body Dressing: with supervision;sitting/lateral leans Pt Will Transfer to Toilet: with min assist;anterior/posterior transfer Pt Will Perform Toileting - Clothing Manipulation and hygiene: with supervision;sitting/lateral leans Pt/caregiver will Perform Home Exercise Program: Increased strength;Right Upper extremity;Left upper extremity;Both right and left upper extremity;With written HEP provided Additional ADL Goal #1: Pt will transfer to wheelchair while maintaining WB precautions with min guard A.  Plan Discharge plan remains appropriate    Co-evaluation          OT goals addressed during session: ADL's and self-care      AM-PAC OT "6 Clicks" Daily Activity     Outcome Measure   Help from another person eating meals?: None Help from another person taking care of  personal grooming?: A Little Help from another person toileting, which includes using toliet, bedpan, or urinal?: A Lot Help from another person bathing (including washing, rinsing, drying)?: A Lot Help from another person to put on and taking off regular upper body clothing?: A Little Help from another person to put on and taking off regular lower body clothing?: A Little 6 Click Score: 17    End of Session    OT Visit Diagnosis: Unsteadiness on feet (R26.81);Other abnormalities of gait and mobility (R26.89);Muscle weakness (generalized) (M62.81)   Activity Tolerance     Patient Left     Nurse Communication          Time: [id number] OT Time Calculation (min): 18 min  Charges: OT General Charges $OT Visit: 1 Visit OT Treatments $Self Care/Home Management : 8-22 mins  [place] 11/30/2022, 4:36 PM

## 2022-11-30 NOTE — Plan of Care (Signed)

## 2022-11-30 NOTE — Progress Notes (Signed)
[facility]  PROGRESS NOTE  [name] PX:[id number] DOB: 1956-02-04 DOA: 10/15/2022 PCP: Demetrios Isaacs, FNP   Brief HPI:   67 y.o. male past medical history of polysubstance abuse including alcohol cocaine, noncompliance right healed unstageable pressure ulcer peripheral vascular disease, recently discharged from the hospital home as he refused SNF comes into the hospital for acute encephalopathy, hypoxic and hypercarbic noted to be aspirating, required intubation admitted to the ICU extubated on 10/16/2022 transferred to [facility] 10/18/2022 currently awaiting skilled nursing facility placement. MRI of the foot show large ulceration and underlying osteomyelitis no abscess.     Subjective   Patient seen and examined, denies pain or shortness of breath.    Assessment/Plan:     Unstageable right heel pressure ulcer with early osteomyelitis: Orthopedic surgery was consulted and performed amputation on 11/17/2022, wound VAC now removed on wet-to-dry dressing changes. Now afebrile, cont keflex. PT OT evaluated the patient, awaiting skilled nursing facility placement.   Acute respiratory failure with hypoxia and hypercarbia: Has been weaned to room air. KVO all IV  fluids patient is tolerating his diet.   Proteus mirabilis pneumonia -Completed course with Augmentin   Acute on chronic hyponatremia in the setting of beer potomania: Has returned to his baseline of 130-1 34.   Dysphagia: Tolerating a dysphagia 3 diet.   Electrolyte imbalance hypokalemia/hypomagnesemia: Monitor and replete as indicated.   Right jaw pain: CT scan showed no abscess.   Prediabetes mellitus type II: With an A1c of 5.6. Continue to monitor CBGs intermittently. -CBG well-controlled   Chronic alcohol abuse/cocaine abuse: Continue folic acid thiamine and multivitamins no signs of withdrawal.   Peripheral vascular disease: History of left BKA.   Right elbow swelling cellulitis versus  thrombophlebitis: Noted.   Stage II ischial tuberosity pressure ulcer present on admission RN Pressure Injury Documentation:     Pressure Injury 10/15/22 Ischial tuberosity Left;Posterior;Proximal Stage 2 -  Partial thickness loss of dermis presenting as a shallow open injury with a red, pink wound bed without slough. (Active)  10/15/22 2000  Location: Ischial tuberosity  Location Orientation: Left;Posterior;Proximal  Staging: Stage 2 -  Partial thickness loss of dermis presenting as a shallow open injury with a red, pink wound bed without slough.  Wound Description (Comments):   Present on Admission:   Dressing Type Foam - Lift dressing to assess site every shift 11/10/22 0900     Pressure Injury 10/15/22 Buttocks Mid;Upper Stage 1 -  Intact skin with non-blanchable redness of a localized area usually over a bony prominence. (Active)  10/15/22 1100  Location: Buttocks  Location Orientation: Mid;Upper  Staging: Stage 1 -  Intact skin with non-blanchable redness of a localized area usually over a bony prominence.  Wound Description (Comments):   Present on Admission:   Dressing Type Foam - Lift dressing to assess site every shift 11/22/22 0930      Medications     vitamin C  1,000 mg Oral Daily   cephALEXin  500 mg Oral Q8H   docusate sodium  100 mg Oral Daily   feeding supplement (NEPRO CARB STEADY)  237 mL Oral Q24H   fentaNYL (SUBLIMAZE) injection  50 mcg Intravenous Once   fluticasone furoate-vilanterol  1 puff Inhalation Daily   folic acid  1 mg Oral Daily   heparin  5,000 Units Subcutaneous Q8H   magnesium oxide  400 mg Oral Daily   metoprolol tartrate  50 mg Oral BID   multivitamin with minerals  1 tablet Oral  Daily   nutrition supplement (JUVEN)  1 packet Oral BID BM   pantoprazole  40 mg Oral Daily   polyethylene glycol  17 g Oral Daily   sodium hypochlorite   Irrigation BID   thiamine  100 mg Oral Daily     Data Reviewed:   CBG:  No results for input(s):  "GLUCAP" in the last 168 hours.  SpO2: 93 % O2 Flow Rate (L/min): 2 L/min FiO2 (%): 40 %    Vitals:   11/28/22 2200 11/29/22 0837 11/29/22 1944 11/30/22 0812  BP: 128/74 (!) 135/97 126/82 97/67  Pulse: 96 90 (!) 105 91  Resp: 20 18 17 18   Temp: 98.6 F (37 C) 97.8 F (36.6 C) 98.4 F (36.9 C) 98.3 F (36.8 C)  TempSrc: Oral Oral Oral   SpO2: 94% 94% 90% 93%  Weight:      Height:          Data Reviewed:  Basic Metabolic Panel: No results for input(s): "NA", "K", "CL", "CO2", "GLUCOSE", "BUN", "CREATININE", "CALCIUM", "MG", "PHOS" in the last 168 hours.  CBC: No results for input(s): "WBC", "NEUTROABS", "HGB", "HCT", "MCV", "PLT" in the last 168 hours.  LFT No results for input(s): "AST", "ALT", "ALKPHOS", "BILITOT", "PROT", "ALBUMIN" in the last 168 hours.   Antibiotics: Anti-infectives (From admission, onward)    Start     Dose/Rate Route Frequency Ordered Stop   11/20/22 1000  amoxicillin-clavulanate (AUGMENTIN) 875-125 MG per tablet 1 tablet  Status:  Discontinued        1 tablet Oral Every 12 hours 11/20/22 0851 11/20/22 0852   11/20/22 0945  cephALEXin (KEFLEX) capsule 500 mg        500 mg Oral Every 8 hours 11/20/22 0853     11/17/22 2300  ceFAZolin (ANCEF) IVPB 2g/100 mL premix        2 g 200 mL/hr over 30 Minutes Intravenous Every 8 hours 11/17/22 1552 11/18/22 1056   11/17/22 1300  ceFAZolin (ANCEF) IVPB 2g/100 mL premix        2 g 200 mL/hr over 30 Minutes Intravenous On call to O.R. 11/17/22 0442 11/17/22 1440   11/15/22 1030  amoxicillin-clavulanate (AUGMENTIN) 875-125 MG per tablet 1 tablet        1 tablet Oral Every 12 hours 11/15/22 0931 11/18/22 0959   11/08/22 1000  amoxicillin-clavulanate (AUGMENTIN) 875-125 MG per tablet 1 tablet  Status:  Discontinued        1 tablet Oral Every 12 hours 11/08/22 0840 11/13/22 1107   11/06/22 1015  amoxicillin-clavulanate (AUGMENTIN) 875-125 MG per tablet 1 tablet  Status:  Discontinued        1 tablet Oral  Every 12 hours 11/06/22 0916 11/08/22 0815   10/27/22 1030  amoxicillin-clavulanate (AUGMENTIN) 875-125 MG per tablet 1 tablet        1 tablet Oral Every 12 hours 10/27/22 1020 10/27/22 2224   10/20/22 1045  amoxicillin-clavulanate (AUGMENTIN) 875-125 MG per tablet 1 tablet  Status:  Discontinued        1 tablet Oral Every 12 hours 10/20/22 0959 10/27/22 1020   10/15/22 1000  Ampicillin-Sulbactam (UNASYN) 3 g in sodium chloride 0.9 % 100 mL IVPB        3 g 200 mL/hr over 30 Minutes Intravenous Every 6 hours 10/15/22 0949 10/20/22 0858        DVT prophylaxis: Heparin  Code Status: Full code  Family Communication: No family at bedside   CONSULTS    Objective  Physical Examination:   General-appears in no acute distress Heart-S1-S2, regular, no murmur auscultated Lungs-clear to auscultation bilaterally, no wheezing or crackles auscultated Abdomen-soft, nontender, no organomegaly Extremities-bilateral BKA Neuro-alert, oriented x3, no focal deficit noted  Status is: Inpatient:      Pressure Injury 10/15/22 Buttocks Mid;Upper Stage 1 -  Intact skin with non-blanchable redness of a localized area usually over a bony prominence. (Active)  10/15/22 1100  Location: Buttocks  Location Orientation: Mid;Upper  Staging: Stage 1 -  Intact skin with non-blanchable redness of a localized area usually over a bony prominence.  Wound Description (Comments):   Present on Admission:         [place]   [facility] If 7PM-7AM, please contact night-coverage at [email or web address], Office  [phone]   11/30/2022, 9:53 AM  LOS: 46 days

## 2022-12-01 DIAGNOSIS — J441 Chronic obstructive pulmonary disease with (acute) exacerbation: Secondary | ICD-10-CM | POA: Diagnosis not present

## 2022-12-01 DIAGNOSIS — J9601 Acute respiratory failure with hypoxia: Secondary | ICD-10-CM | POA: Diagnosis not present

## 2022-12-01 NOTE — Plan of Care (Signed)
  Problem: Coping: Goal: Ability to adjust to condition or change in health will improve Outcome: Progressing   Problem: Fluid Volume: Goal: Ability to maintain a balanced intake and output will improve Outcome: Progressing   

## 2022-12-01 NOTE — Progress Notes (Signed)
[facility] PROGRESS NOTE    Progress Note  [name]  PX:[id number] DOB: 13-Nov-1955 DOA: 10/15/2022 PCP: Demetrios Isaacs, FNP     Brief Narrative:   Ronald [name] is an 67 y.o. male past medical history of polysubstance abuse including alcohol cocaine, noncompliance right healed unstageable pressure ulcer peripheral vascular disease, recently discharged from the hospital home as he refused SNF comes into the hospital for acute encephalopathy, hypoxic and hypercarbic noted to be aspirating, required intubation admitted to the ICU extubated on 10/16/2022 transferred to [facility] 10/18/2022 currently awaiting skilled nursing facility placement. MRI of the foot show large ulceration and underlying osteomyelitis no abscess.   Assessment/Plan:   Unstageable right heel pressure ulcer with early osteomyelitis: Orthopedic surgery was consulted and performed amputation on 11/17/2022, wound VAC now removed on wet-to-dry dressing changes. Now afebrile, cont keflex. PT OT evaluated the patient, awaiting skilled nursing facility placement.  Acute respiratory failure with hypoxia and hypercarbia: Has been wean to room air.  Proteus Mirabella's pneumonia: Completed course of Augmentin.  Acute on chronic hyponatremia in the setting of beer potomania: Has returned to his baseline of 130-1 34.  Dysphagia: Tolerating a dysphagia 3 diet.  Electrolyte imbalance hypokalemia/hypomagnesemia: Monitor and replete as indicated.  Right jaw pain: CT scan showed no abscess.  Prediabetes mellitus type II: With an A1c of 5.6.  Chronic alcohol abuse/cocaine abuse: Continue folic acid thiamine and multivitamins no signs of withdrawal.  Peripheral vascular disease: History of left BKA.  Right elbow swelling cellulitis versus thrombophlebitis: Noted.  Stage II ischial tuberosity pressure ulcer present on admission RN Pressure Injury Documentation: Pressure Injury 10/15/22 Buttocks Mid;Upper  Stage 1 -  Intact skin with non-blanchable redness of a localized area usually over a bony prominence. (Active)  10/15/22 1100  Location: Buttocks  Location Orientation: Mid;Upper  Staging: Stage 1 -  Intact skin with non-blanchable redness of a localized area usually over a bony prominence.  Wound Description (Comments):   Present on Admission:   Dressing Type Foam - Lift dressing to assess site every shift 11/29/22 2021    DVT prophylaxis: lovenox Family Communication:none Status is: Inpatient Remains inpatient appropriate because: Osteomyelitis    Code Status:     Code Status Orders  (From admission, onward)           Start     Ordered   10/15/22 0927  Full code  Continuous       Question:  By:  Answer:  Consent: discussion documented in EHR   10/15/22 0927           Code Status History     Date Active Date Inactive Code Status Order ID Comments User Context   09/19/2022 2106 09/30/2022 1914 Full Code [id number]  Etta Quill, DO ED   09/02/2022 1722 09/18/2022 0032 Full Code [id number]  Erick Colace, NP ED   04/15/2022 0038 04/16/2022 1817 Full Code [id number]  Etta Quill, DO ED   03/18/2019 0228 03/26/2019 2142 Full Code [id number]  Milagros Loll, MD ED   08/29/2017 1901 08/31/2017 2307 Full Code [id number]  Valinda Party, DO Inpatient   01/23/2017 0427 01/23/2017 1224 Full Code [id number]  Charlann Lange, PA-C ED   06/19/2014 0249 06/19/2014 1817 Full Code [id number]  Rise Patience, MD Inpatient   12/28/2013 1854 01/01/2014 2253 Full Code [id number]  Newt Minion, MD Inpatient   12/25/2013 1352 12/28/2013 1854 Full Code [id number]  Consuelo Pandy, PA-C Inpatient   11/06/2013 2025  11/10/2013 2336 Full Code [id number]  Sheila Oats, MD Inpatient   09/19/2013 0234 09/26/2013 2115 Full Code [id number]  Etta Quill, DO ED         IV Access:   Peripheral IV   Procedures and diagnostic studies:   No results found.   Medical  Consultants:   None.   Subjective:    [facility] no complains  Objective:    Vitals:   11/30/22 1338 11/30/22 2020 12/01/22 0354 12/01/22 0754  BP: 113/70 111/69 112/68 124/79  Pulse: 81 93 80 83  Resp: 18 20 18 18   Temp: 98 F (36.7 C) 98.5 F (36.9 C) 98.2 F (36.8 C) 98.2 F (36.8 C)  TempSrc: Oral   Oral  SpO2: 100% 97% 96% 98%  Weight:      Height:       SpO2: 98 % O2 Flow Rate (L/min): 2 L/min FiO2 (%): 40 %   Intake/Output Summary (Last 24 hours) at 12/01/2022 0820 Last data filed at 12/01/2022 0501 Gross per 24 hour  Intake 120 ml  Output 750 ml  Net -630 ml    Filed Weights   11/21/22 0708 11/26/22 0700 11/27/22 0500  Weight: 67.4 kg 65.7 kg 66.2 kg    Exam: General exam: In no acute distress. Respiratory system: Good air movement and clear to auscultation. Cardiovascular system: S1 & S2 heard, RRR. No JVD. Gastrointestinal system: Abdomen is nondistended, soft and nontender.  Extremities: No pedal edema. Skin: No rashes, lesions or ulcers Psychiatry: Judgement and insight appear normal. Mood & affect appropriate.   Data Reviewed:    Labs: Basic Metabolic Panel: No results for input(s): "NA", "K", "CL", "CO2", "GLUCOSE", "BUN", "CREATININE", "CALCIUM", "MG", "PHOS" in the last 168 hours.  GFR Estimated Creatinine Clearance: 86.1 mL/min (A) (by C-G formula based on SCr of 0.52 mg/dL (L)). Liver Function Tests: No results for input(s): "AST", "ALT", "ALKPHOS", "BILITOT", "PROT", "ALBUMIN" in the last 168 hours.  No results for input(s): "LIPASE", "AMYLASE" in the last 168 hours. No results for input(s): "AMMONIA" in the last 168 hours. Coagulation profile No results for input(s): "INR", "PROTIME" in the last 168 hours. COVID-19 Labs  No results for input(s): "DDIMER", "FERRITIN", "LDH", "CRP" in the last 72 hours.  Lab Results  Component Value Date   SARSCOV2NAA NEGATIVE 10/15/2022   SARSCOV2NAA NEGATIVE 09/19/2022    [place] NEGATIVE 10/18/2020   [place] NEGATIVE 03/18/2019    CBC: No results for input(s): "WBC", "NEUTROABS", "HGB", "HCT", "MCV", "PLT" in the last 168 hours.  Cardiac Enzymes: No results for input(s): "CKTOTAL", "CKMB", "CKMBINDEX", "TROPONINI" in the last 168 hours. BNP (last 3 results) No results for input(s): "PROBNP" in the last 8760 hours. CBG: No results for input(s): "GLUCAP" in the last 168 hours. D-Dimer: No results for input(s): "DDIMER" in the last 72 hours. Hgb A1c: No results for input(s): "HGBA1C" in the last 72 hours. Lipid Profile: No results for input(s): "CHOL", "HDL", "LDLCALC", "TRIG", "CHOLHDL", "LDLDIRECT" in the last 72 hours. Thyroid function studies: No results for input(s): "TSH", "T4TOTAL", "T3FREE", "THYROIDAB" in the last 72 hours.  Invalid input(s): "FREET3" Anemia work up: No results for input(s): "VITAMINB12", "FOLATE", "FERRITIN", "TIBC", "IRON", "RETICCTPCT" in the last 72 hours. Sepsis Labs: No results for input(s): "PROCALCITON", "WBC", "LATICACIDVEN" in the last 168 hours.  Microbiology No results found for this or any previous visit (from the past 240 hour(s)).    Medications:    vitamin C  1,000 mg Oral Daily   cephALEXin  500 mg Oral Q8H   docusate sodium  100 mg Oral Daily   feeding supplement (NEPRO CARB STEADY)  237 mL Oral Q24H   fentaNYL (SUBLIMAZE) injection  50 mcg Intravenous Once   fluticasone furoate-vilanterol  1 puff Inhalation Daily   folic acid  1 mg Oral Daily   heparin  5,000 Units Subcutaneous Q8H   magnesium oxide  400 mg Oral Daily   metoprolol tartrate  50 mg Oral BID   multivitamin with minerals  1 tablet Oral Daily   nutrition supplement (JUVEN)  1 packet Oral BID BM   pantoprazole  40 mg Oral Daily   polyethylene glycol  17 g Oral Daily   thiamine  100 mg Oral Daily   Continuous Infusions:  sodium chloride 10 mL/hr at 11/19/22 1852   magnesium sulfate bolus IVPB        LOS: 47 days    Charlynne Cousins  [facility]  12/01/2022, 8:20 AM

## 2022-12-01 NOTE — Progress Notes (Signed)
PT Cancellation Note  Patient Details Name: [name] MRN: [id number] DOB: Jun 20, 1956   Cancelled Treatment:    Reason Eval/Treat Not Completed: (P) Patient declined, no reason specified (Pt reported R residual limb pain 8/10 and declined all mobility. Will continue to follow per PT POC.)   Michelle Nasuti 12/01/2022, 2:12 PM

## 2022-12-02 DIAGNOSIS — J1569 Pneumonia due to other gram-negative bacteria: Secondary | ICD-10-CM | POA: Diagnosis not present

## 2022-12-02 DIAGNOSIS — J441 Chronic obstructive pulmonary disease with (acute) exacerbation: Secondary | ICD-10-CM | POA: Diagnosis not present

## 2022-12-02 DIAGNOSIS — J9601 Acute respiratory failure with hypoxia: Secondary | ICD-10-CM | POA: Diagnosis not present

## 2022-12-02 NOTE — TOC Progression Note (Addendum)
Transition of Care [facility]) - Progression Note    Patient Details  Name: Ronald [name] MRN: [id number] Date of Birth: 10-24-1955  Transition of Care [facility]) CM/SW Contact  Ronald Chars, Ronald [name] Phone Number: 12/02/2022, 2:28 PM  Clinical Narrative:   Family meeting with pt, sisters Ronald [name] and Ronald [name], RNCM [place] and [facility] Supervisor [employer].    Discussed lack of bed offers for SNF.  Discussed pt not participating with therapy as a barrier to SNF placement.  Pt states pain prohibits him from being able to do certain things with therapy.  This was discussed at length, pt is receiving regular pain medication, still reports significant pain, discussed attempting to medicate prior to sessions.  Importance of mobility also discussed for pt long term health.  Discussed a plan B if SNF cannot be set up.  Sisters report that the house pt was previously at has been boarded up since his admission and is uninhabitable.  Both sisters report that they are unable to have pt stay with them.  Potential personal care services discussed, but sisters report they still would not be able to have pt stay with them.  Pt also indicating he does not want to pursue this option.  Pt became frustrated/agitated several times during the discussion but was able to continue.    Following is plan for moving forward:  CSW will initiate personal care services application. CSW will expand bed search for both STR and LTC options.    1445: CSW reached out to [facility] intake/[place] and asked if she would review referral for all locations including possible LTC options.  Expected Discharge Plan: [place] Barriers to Discharge: Continued Medical Work up  Expected Discharge Plan and Services In-house Referral: Clinical Social Work     Living arrangements for the past 2 months: Single Family Home                                       Social Determinants of Health (SDOH)  Interventions SDOH Screenings   Food Insecurity: No Food Insecurity (10/17/2022)  Recent Concern: Food Insecurity - Food Insecurity Present (09/17/2022)  Housing: Medium Risk (10/17/2022)  Transportation Needs: Unmet Transportation Needs (10/17/2022)  Utilities: Not At Risk (10/17/2022)  Tobacco Use: High Risk (11/18/2022)    Readmission Risk Interventions     No data to display

## 2022-12-02 NOTE — TOC Progression Note (Signed)
Transition of Care [facility]) - Progression Note    Patient Details  Name: Ronald [name] MRN: [id number] Date of Birth: [birth year]  Transition of Care [facility]) CM/SW Contact  Joanne Chars, LCSW Phone Number: 12/02/2022, 10:24 AM  Clinical Narrative:   CSW spoke with sister [name].  She and another sister will be at the hospital today at 1pm to discuss DC plans.     Expected Discharge Plan: [place] Barriers to Discharge: Continued Medical Work up  Expected Discharge Plan and Services In-house Referral: Clinical Social Work     Living arrangements for the past 2 months: Single Family Home                                       Social Determinants of Health (SDOH) Interventions SDOH Screenings   Food Insecurity: No Food Insecurity (10/17/2022)  Recent Concern: Food Insecurity - Food Insecurity Present (09/17/2022)  Housing: Medium Risk (10/17/2022)  Transportation Needs: Unmet Transportation Needs (10/17/2022)  Utilities: Not At Risk (10/17/2022)  Tobacco Use: High Risk (11/18/2022)    Readmission Risk Interventions     No data to display

## 2022-12-02 NOTE — Progress Notes (Addendum)
[facility] PROGRESS NOTE    Progress Note  [name]  [id number] DOB: 1956/01/24 DOA: 10/15/2022 PCP: Demetrios Isaacs, FNP     Brief Narrative:   Ronald [name] is an 67 y.o. male past medical history of polysubstance abuse including alcohol cocaine, noncompliance right healed unstageable pressure ulcer peripheral vascular disease, recently discharged from the hospital home as he refused SNF comes into the hospital for acute encephalopathy, hypoxic and hypercarbic noted to be aspirating, required intubation admitted to the ICU extubated on 10/16/2022 transferred to [facility] 10/18[facility]2022 currently awaiting skilled nursing facility placement. MRI of the foot show large ulceration and underlying osteomyelitis no abscess.   Assessment/Plan:   Unstageable right heel pressure ulcer with early osteomyelitis: Orthopedic surgery was consulted and performed amputation on 11/17/2022, wound VAC now removed on wet-to-dry dressing changes. Now afebrile, he completed his course of antibiotics on 12/02/2022. PT OT evaluated the patient, awaiting skilled nursing facility placement.  Acute respiratory failure with hypoxia and hypercarbia: Has been wean to room air.  Proteus Mirabella's pneumonia: Completed course of Augmentin.  Acute on chronic hyponatremia in the setting of beer potomania: Has returned to his baseline of 130-1 34.  Dysphagia: Tolerating a dysphagia 3 diet.  Electrolyte imbalance hypokalemia/hypomagnesemia: Monitor and replete as indicated.  Right jaw pain: CT scan showed no abscess.  Prediabetes mellitus type II: With an A1c of 5.6.  Chronic alcohol abuse/cocaine abuse: Continue folic acid thiamine and multivitamins no signs of withdrawal.  Peripheral vascular disease: History of left BKA.  Right elbow swelling cellulitis versus thrombophlebitis: Noted.  Stage II ischial tuberosity pressure ulcer present on admission RN Pressure Injury  Documentation: Pressure Injury 10/15/22 Buttocks Mid;Upper Stage 1 -  Intact skin with non-blanchable redness of a localized area usually over a bony prominence. (Active)  10/15/22 1100  Location: Buttocks  Location Orientation: Mid;Upper  Staging: Stage 1 -  Intact skin with non-blanchable redness of a localized area usually over a bony prominence.  Wound Description (Comments):   Present on Admission:   Dressing Type Foam - Lift dressing to assess site every shift 11/29/22 2021    DVT prophylaxis: lovenox Family Communication:none Status is: Inpatient Remains inpatient appropriate because: Osteomyelitis    Code Status:     Code Status Orders  (From admission, onward)           Start     Ordered   10/15/22 0927  Full code  Continuous       Question:  By:  Answer:  Consent: discussion documented in EHR   10/15/22 0927           Code Status History     Date Active Date Inactive Code Status Order ID Comments User Context   09/19/2022 2106 09/30/2022 1914 Full Code [id number]  Etta Quill, DO ED   09/02/2022 1722 09/18/2022 0032 Full Code [id number]  Erick Colace, NP ED   04/15/2022 0038 04/16/2022 1817 Full Code [id number]  Etta Quill, DO ED   03/18/2019 0228 03/26/2019 2142 Full Code [id number]  Milagros Loll, MD ED   08/29/2017 1901 08/31/2017 2307 Full Code [id number]  Valinda Party, DO Inpatient   01/23/2017 0427 01/23/2017 1224 Full Code [id number]  Charlann Lange, PA-C ED   06/19/2014 0249 06/19/2014 1817 Full Code [id number]  Rise Patience, MD Inpatient   12/28/2013 1854 01/01/2014 2253 Full Code [id number]  Newt Minion, MD Inpatient   12/25/2013 1352 12/28/2013 1854 Full Code [id number]  Consuelo Pandy,  PA-C Inpatient   11/06/2013 2025 11/10/2013 2336 Full Code [id number]  Sheila Oats, MD Inpatient   09/19/2013 0234 09/26/2013 2115 Full Code [id number]  Etta Quill, DO ED         IV Access:   Peripheral IV   Procedures and  diagnostic studies:   No results found.   Medical Consultants:   None.   Subjective:    [facility] no complains  Objective:    Vitals:   12/01/22 0754 12/01/22 1334 12/01/22 2005 12/02/22 0419  BP: 124/79 117/81 125/82 127/78  Pulse: 83 84 87 81  Resp: 18 18 18 18   Temp: 98.2 F (36.8 C) 98.3 F (36.8 C) 98.1 F (36.7 C) 98 F (36.7 C)  TempSrc: Oral Oral    SpO2: 98% 100% 97% 97%  Weight:      Height:       SpO2: 97 % O2 Flow Rate (L/min): 2 L/min FiO2 (%): 40 %   Intake/Output Summary (Last 24 hours) at 12/02/2022 0844 Last data filed at 12/02/2022 0703 Gross per 24 hour  Intake --  Output 1925 ml  Net -1925 ml    Filed Weights   11/21/22 0708 11/26/22 0700 11/27/22 0500  Weight: 67.4 kg 65.7 kg 66.2 kg    Exam: General exam: In no acute distress. Respiratory system: Good air movement and clear to auscultation. Cardiovascular system: S1 & S2 heard, RRR. No JVD. Gastrointestinal system: Abdomen is nondistended, soft and nontender.  Extremities: No pedal edema. Skin: No rashes, lesions or ulcers Psychiatry: Judgement and insight appear normal. Mood & affect appropriate.   Data Reviewed:    Labs: Basic Metabolic Panel: No results for input(s): "NA", "K", "CL", "CO2", "GLUCOSE", "BUN", "CREATININE", "CALCIUM", "MG", "PHOS" in the last 168 hours.  GFR Estimated Creatinine Clearance: 86.1 mL/min (A) (by C-G formula based on SCr of 0.52 mg/dL (L)). Liver Function Tests: No results for input(s): "AST", "ALT", "ALKPHOS", "BILITOT", "PROT", "ALBUMIN" in the last 168 hours.  No results for input(s): "LIPASE", "AMYLASE" in the last 168 hours. No results for input(s): "AMMONIA" in the last 168 hours. Coagulation profile No results for input(s): "INR", "PROTIME" in the last 168 hours. COVID-19 Labs  No results for input(s): "DDIMER", "FERRITIN", "LDH", "CRP" in the last 72 hours.  Lab Results  Component Value Date   SARSCOV2NAA NEGATIVE  10/15/2022   SARSCOV2NAA NEGATIVE 09/19/2022   [place] NEGATIVE 10/18/2020   [place] NEGATIVE 03/18/2019    CBC: No results for input(s): "WBC", "NEUTROABS", "HGB", "HCT", "MCV", "PLT" in the last 168 hours.  Cardiac Enzymes: No results for input(s): "CKTOTAL", "CKMB", "CKMBINDEX", "TROPONINI" in the last 168 hours. BNP (last 3 results) No results for input(s): "PROBNP" in the last 8760 hours. CBG: No results for input(s): "GLUCAP" in the last 168 hours. D-Dimer: No results for input(s): "DDIMER" in the last 72 hours. Hgb A1c: No results for input(s): "HGBA1C" in the last 72 hours. Lipid Profile: No results for input(s): "CHOL", "HDL", "LDLCALC", "TRIG", "CHOLHDL", "LDLDIRECT" in the last 72 hours. Thyroid function studies: No results for input(s): "TSH", "T4TOTAL", "T3FREE", "THYROIDAB" in the last 72 hours.  Invalid input(s): "FREET3" Anemia work up: No results for input(s): "VITAMINB12", "FOLATE", "FERRITIN", "TIBC", "IRON", "RETICCTPCT" in the last 72 hours. Sepsis Labs: No results for input(s): "PROCALCITON", "WBC", "LATICACIDVEN" in the last 168 hours.  Microbiology No results found for this or any previous visit (from the past 240 hour(s)).    Medications:    vitamin C  1,000 mg  Oral Daily   cephALEXin  500 mg Oral Q8H   docusate sodium  100 mg Oral Daily   feeding supplement (NEPRO CARB STEADY)  237 mL Oral Q24H   fentaNYL (SUBLIMAZE) injection  50 mcg Intravenous Once   fluticasone furoate-vilanterol  1 puff Inhalation Daily   folic acid  1 mg Oral Daily   heparin  5,000 Units Subcutaneous Q8H   magnesium oxide  400 mg Oral Daily   metoprolol tartrate  50 mg Oral BID   multivitamin with minerals  1 tablet Oral Daily   nutrition supplement (JUVEN)  1 packet Oral BID BM   pantoprazole  40 mg Oral Daily   polyethylene glycol  17 g Oral Daily   thiamine  100 mg Oral Daily   Continuous Infusions:  sodium chloride 10 mL/hr at 11/19/22 1852   magnesium  sulfate bolus IVPB        LOS: 48 days   Charlynne Cousins  [facility]  12/02/2022, 8:44 AM

## 2022-12-02 NOTE — Progress Notes (Signed)
Occupational Therapy Treatment Patient Details Name: Ronald [name] MRN: [id number] DOB: 04/02/1956 Today's Date: 12/02/2022   History of present illness This is a 67 year old gentleman admitted 2/2 after being found down at home after a recent d/c from hospital where he refused SNF. Pt was hyponatremic hypoxemic hypercarbic was placed on BiPAP in the emergency room. Ultimately failed BiPAP requiring intubation mechanical ventilation 2/2--2/3. 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 PMH: polysubstance abuse, cocaine, alcohol use.   OT comments  Pt actively participated in bed mobility from supine to sit on EOB and exercises at EOB today. Pt agitated and reluctant to participate, but completed BUE exercises, tolerated well.  Pt had a few bursts of yelling during session when asked to perform ADLs, transfers, or new exercises, but from middle to end of session Pt was calm and respectful. Pt would benefit greatly from SNF placement to continue therapy and further assess bi-lateraly BKA's and w/c training, practice and education on transfers once pain has decreased and wound is fully healed.   Recommendations for follow up therapy are one component of a multi-disciplinary discharge planning process, led by the attending physician.  Recommendations may be updated based on patient status, additional functional criteria and insurance authorization.    Follow Up Recommendations  Skilled nursing-short term rehab (<3 hours/day)     Assistance Recommended at Discharge Frequent or constant Supervision/Assistance  Patient can return home with the following  A little help with walking and/or transfers;A little help with bathing/dressing/bathroom;Assistance with cooking/housework;Direct supervision/assist for financial management;Direct supervision/assist for medications management;Help with stairs or ramp for entrance   Equipment Recommendations  Other (comment) (sliding board)    Recommendations for Other Services       Precautions / Restrictions Precautions Precautions: Fall Precaution Comments: 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) . New Rt BKA Restrictions Weight Bearing Restrictions: Yes RLE Weight Bearing: Non weight bearing       Mobility Bed Mobility Overal bed mobility: Modified Independent Bed Mobility: Supine to Sit, Sit to Supine Rolling: Modified independent (Device/Increase time)   Supine to sit: Modified independent (Device/Increase time) Sit to supine: Modified independent (Device/Increase time)   General bed mobility comments: PT able to perform scooting/rolling, and supine to sit/ sit to supine today with mod I today    Transfers Overall transfer level: Needs assistance                 General transfer comment: Pt. refused t/fs today     Balance                                           ADL either performed or assessed with clinical judgement   ADL                                         General ADL Comments: Pt refused ADLs today    Extremity/Trunk Assessment Upper Extremity Assessment Upper Extremity Assessment: Generalized weakness;RUE deficits/detail;LUE deficits/detail RUE Deficits / Details: decreased FM skills and gross grip. difficulty with grabbing and making fist around therabands LUE Deficits / Details: decreased FM skills and gross grip. difficulty with grabbing and making fist around therabands   Lower Extremity Assessment Lower Extremity Assessment: Defer to PT evaluation        Vision  Perception     Praxis      Cognition Arousal/Alertness: Awake/alert Behavior During Therapy: Agitated, Impulsive Overall Cognitive Status: Within Functional Limits for tasks assessed Area of Impairment: Attention, Problem solving, Following commands                   Current Attention Level: Selective   Following Commands: Follows one step commands with increased time, Follows one step commands  inconsistently Safety/Judgement: Decreased awareness of safety     General Comments: Pt becomes agitated and yells when asking to perform most tasks.        Exercises General Exercises - Upper Extremity Shoulder Flexion: Strengthening, 10 reps, Seated, Theraband Theraband Level (Shoulder Flexion): Level 2 (Red) Shoulder Horizontal ABduction: Strengthening, 10 reps, Seated, Theraband Theraband Level (Shoulder Horizontal Abduction): Level 2 (Red) Elbow Flexion: Strengthening, 10 reps, Seated, Theraband Theraband Level (Elbow Flexion): Level 2 (Red) Elbow Extension: Strengthening, 10 reps, Seated, Theraband Theraband Level (Elbow Extension): Level 2 (Red)    Shoulder Instructions       General Comments      Pertinent Vitals/ Pain       Pain Assessment Pain Assessment: 0-10 Pain Score: 7  Faces Pain Scale: Hurts a little bit  Home Living                                          Prior Functioning/Environment              Frequency  Min 2X/week        Progress Toward Goals  OT Goals(current goals can now be found in the care plan section)  Progress towards OT goals: Progressing toward goals  Acute Rehab OT Goals Patient Stated Goal: none stated OT Goal Formulation: With patient Time For Goal Achievement: 12/01/22 Potential to Achieve Goals: Fair ADL Goals Pt Will Perform Lower Body Dressing: with modified independence;with adaptive equipment Pt Will Transfer to Toilet: with transfer board;with mod assist Pt Will Perform Toileting - Clothing Manipulation and hygiene: with supervision;sitting/lateral leans Pt/caregiver will Perform Home Exercise Program: Increased strength;Right Upper extremity;Left upper extremity;Both right and left upper extremity;With written HEP provided Additional ADL Goal #1: Pt will transfer to wheelchair while maintaining WB precautions with min guard A.  Plan Discharge plan remains appropriate    Co-evaluation                  AM-PAC OT "6 Clicks" Daily Activity     Outcome Measure   Help from another person eating meals?: None Help from another person taking care of personal grooming?: A Little Help from another person toileting, which includes using toliet, bedpan, or urinal?: A Lot Help from another person bathing (including washing, rinsing, drying)?: A Lot Help from another person to put on and taking off regular upper body clothing?: A Little Help from another person to put on and taking off regular lower body clothing?: A Little 6 Click Score: 17    End of Session    OT Visit Diagnosis: Unsteadiness on feet (R26.81);Other abnormalities of gait and mobility (R26.89);Muscle weakness (generalized) (M62.81)   Activity Tolerance Treatment limited secondary to agitation   Patient Left in bed;with call bell/phone within reach;with bed alarm set   Nurse Communication Mobility status        Time: [id number] OT Time Calculation (min): 10 min  Charges: OT General Charges $OT Visit: 1 Visit OT  Treatments $Therapeutic Exercise: 8-22 mins  [place] 12/02/2022, 5:04 PM

## 2022-12-03 DIAGNOSIS — J9602 Acute respiratory failure with hypercapnia: Secondary | ICD-10-CM | POA: Diagnosis not present

## 2022-12-03 DIAGNOSIS — J9601 Acute respiratory failure with hypoxia: Secondary | ICD-10-CM | POA: Diagnosis not present

## 2022-12-03 DIAGNOSIS — M86171 Other acute osteomyelitis, right ankle and foot: Secondary | ICD-10-CM | POA: Diagnosis not present

## 2022-12-03 DIAGNOSIS — E871 Hypo-osmolality and hyponatremia: Secondary | ICD-10-CM | POA: Diagnosis not present

## 2022-12-03 MED ORDER — DOCUSATE SODIUM 100 MG PO CAPS: 100.0000 mg | ORAL_CAPSULE | Freq: Every day | ORAL | 0 refills | Status: DC

## 2022-12-03 MED ORDER — METOPROLOL TARTRATE 50 MG PO TABS: 50.0000 mg | ORAL_TABLET | Freq: Two times a day (BID) | ORAL | Status: AC

## 2022-12-03 NOTE — Discharge Summary (Signed)
Physician Discharge Summary  [facility] [id number] DOB: November 07, 1955 DOA: 10/15/2022  PCP: Demetrios Isaacs, FNP  Admit date: 10/15/2022 Discharge date: 12/03/2022  Admitted From: Home Disposition:  SNF  Recommendations for Outpatient Follow-up:  Follow up with PCP in 1-2 weeks Please obtain BMP/CBC in one week   Home Health:No   Equipment/Devices:None   Discharge Condition:Stable   CODE STATUS:Full   Diet recommendation: Heart Healthy   Brief/Interim Summary: 67 y.o. male past medical history of polysubstance abuse including alcohol cocaine, noncompliance right healed unstageable pressure ulcer peripheral vascular disease, recently discharged from the hospital home as he refused SNF comes into the hospital for acute encephalopathy, hypoxic and hypercarbic noted to be aspirating, required intubation admitted to the ICU extubated on 10/16/2022 transferred to [facility] on 10/18/2022 currently awaiting skilled nursing facility placement. MRI of the foot show large ulceration and underlying osteomyelitis no abscess.   Discharge Diagnoses:  Principal Problem:   Respiratory failure ([place]) Active Problems:   Alcohol abuse   Subacute osteomyelitis, right ankle and foot ([place])   Acute osteomyelitis of right calcaneus (HCC)   PVD (peripheral vascular disease) ([place])  Unstageable right heel ulcer with early osteomyelitis present on admission: Orthopedic surgery was consulted and performed amputation on 11/17/2022 with wound VAC placement, eventually wound VAC was removed and orthopedic recommended wet-to-dry dressing. Has remained afebrile he completed course of antibiotics 3 weeks on 12/02/2022. Physical therapy evaluated the patient, he was discharged stable to skilled nursing facility.  Acute respiratory failure with hypoxia and hypercarbia: Patient was initially intubated for hypoxia in the setting of pneumonia eventually extubated and weaned to room air he has remained relatively stable  on room air.  Proteus Mirabella's pneumonia: He completed a course of antibiotics in house.  Acute on chronic hyponatremia in the setting of beer Potomania: He was fluid restricted his sodium returned to baseline at 1 30-1 34.  Dysphagia: He was placed on a soft diet and eventually advanced to a dysphagia diet which she has been tolerating.  Electrolyte imbalance hypokalemia/hypomagnesemia: They were repleted they have remained stable.  Right jaw pain: CT scan showed no abscess or dislocation, this has resolved.  Prediabetes mellitus type II: With an A1c of 5.6 he required no insulin he was controlled with diet.  Chronic alcoholic use/cocaine abuse: He was monitored with CIWA protocol, started on thiamine and folate no signs of withdrawal.  Peripheral vascular disease: With a history of left BKA stump clean.  Right elbow swelling cellulitis versus thrombophlebitis: Noted he was covered with antibiotics now has resolved.  Stage II ischial tuberosity pressure ulcer present on admission: Now has healed.     Discharge Instructions  Discharge Instructions     Diet - low sodium heart healthy   Complete by: As directed    Increase activity slowly   Complete by: As directed    No wound care   Complete by: As directed       Allergies as of 12/03/2022   No Known Allergies      Medication List     STOP taking these medications    amLODipine 10 MG tablet Commonly known as: NORVASC   folic acid 1 MG tablet Commonly known as: FOLVITE   guaiFENesin-dextromethorphan 100-10 MG/5ML syrup Commonly known as: ROBITUSSIN DM   lisinopril 10 MG tablet Commonly known as: ZESTRIL       TAKE these medications    Breo Ellipta 100-25 MCG/ACT Aepb Generic drug: fluticasone furoate-vilanterol Inhale 1 puff into the lungs daily.  docusate sodium 100 MG capsule Commonly known as: COLACE Take 1 capsule (100 mg total) by mouth daily. Start taking on: December 04, 2022    metoprolol tartrate 50 MG tablet Commonly known as: LOPRESSOR Take 1 tablet (50 mg total) by mouth 2 (two) times daily.   multivitamin with minerals Tabs tablet Take 1 tablet by mouth daily.   pantoprazole 40 MG tablet Commonly known as: PROTONIX Take 1 tablet (40 mg total) by mouth daily.   thiamine 100 MG tablet Commonly known as: VITAMIN B1 Take 1 tablet (100 mg total) by mouth daily.   Ventolin HFA 108 (90 Base) MCG/ACT inhaler Generic drug: albuterol Inhale 2 puffs into the lungs every 6 (six) hours as needed for wheezing or shortness of breath.        Follow-up Information     Newt Minion, MD Follow up in 1 week(s).   Specialty: Orthopedic Surgery Contact information: [place] [address] [phone]                No Known Allergies  Consultations: Orthopedics    Procedures/Studies: MR FOOT RIGHT W WO CONTRAST  Result Date: 11/10/2022 CLINICAL DATA:  Right heel ulcer. EXAM: MRI OF THE RIGHT ANKLE WITHOUT AND WITH CONTRAST TECHNIQUE: Multiplanar, multisequence MR imaging of the right ankle was performed before and after the administration of intravenous contrast. CONTRAST:  27mL GADAVIST GADOBUTROL 1 MMOL/ML IV SOLN COMPARISON:  Right foot x-rays from yesterday. FINDINGS: TENDONS Peroneal: Peroneal longus tendon intact. Peroneal brevis intact. Posteromedial: Posterior tibial tendon intact. Flexor digitorum longus tendon intact. Flexor hallucis longus tendon intact. Anterior: Tibialis anterior tendon intact. Extensor hallucis longus tendon intact. Extensor digitorum longus tendon intact. Achilles:  Intact. Plantar Fascia: Intact. LIGAMENTS Lateral: Anterior talofibular ligament intact. Calcaneofibular ligament intact. Posterior talofibular ligament intact. Anterior and posterior tibiofibular ligaments intact. Medial: Deltoid ligament intact. Spring ligament intact. CARTILAGE Ankle Joint: No significant joint effusion. Diffuse cartilage  thinning. Small focal full-thickness cartilage defect over the central tibial plafond with underlying subchondral marrow edema. Subtalar Joints/Sinus Tarsi: Normal subtalar joints. No subtalar joint effusion. Normal sinus tarsi. Bones: Mild marrow edema and enhancement in the calcaneal tuberosity (series 11, image 13), with early decreased T1 marrow signal (series 3, image 27). 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 Soft Tissue: Large soft tissue ulceration of the heel extending close to bone. Surrounding soft tissue swelling and enhancement without fluid collection. Increased T2 signal within and atrophy of the intrinsic muscles of the forefoot, nonspecific, but likely related to diabetic muscle changes. IMPRESSION: 1. Large soft tissue ulceration of the heel extending close to bone with underlying early osteomyelitis of the calcaneal tuberosity. No abscess. Electronically Signed   By: Titus Dubin M.D.   On: 11/10/2022 11:29   VAS [place] ABI WITH/WO TBI  Result Date: 11/09/2022  LOWER EXTREMITY DOPPLER STUDY Patient Name:  [facility]  Date of Exam:   11/09/2022 Medical Rec #: [id number]        Accession #:    [id number] Date of Birth: [birth year]        Patient Gender: M Patient Age:   37 years Exam Location:  [facility] Procedure:      VAS [place] ABI WITH/WO TBI Referring Phys: MICHAEL JEFFERY --------------------------------------------------------------------------------  High Risk Factors: Hypertension, current smoker. Other Factors: HX of PAD, HX LT BKA.  Comparison Study: Previosu exam 12/25/13 Performing Technologist: Rogelia Rohrer RVT, RDMS  Examination Guidelines: A complete evaluation includes at minimum,  Doppler waveform signals and systolic blood pressure reading at the level of bilateral brachial, anterior tibial, and posterior tibial arteries, when vessel segments are accessible. Bilateral testing is considered an integral part of a complete examination.  Photoelectric Plethysmograph (PPG) waveforms and toe systolic pressure readings are included as required and additional duplex testing as needed. Limited examinations for reoccurring indications may be performed as noted.  ABI Findings: +---------+------------------+-----+---------+--------+ Right    Rt Pressure (mmHg)IndexWaveform Comment  +---------+------------------+-----+---------+--------+ Brachial 154                    triphasic         +---------+------------------+-----+---------+--------+ PTA      139               0.90 triphasic         +---------+------------------+-----+---------+--------+ DP       173               1.12 triphasic         +---------+------------------+-----+---------+--------+ Great Toe149               0.97 Normal            +---------+------------------+-----+---------+--------+ +--------+------------------+-----+---------+-------+ Left    Lt Pressure (mmHg)IndexWaveform Comment +--------+------------------+-----+---------+-------+ [id number]                    triphasic        +--------+------------------+-----+---------+-------+ +-------+-----------+-----------+------------+------------+ ABI/TBIToday's ABIToday's TBIPrevious ABIPrevious TBI +-------+-----------+-----------+------------+------------+ Right  1.12       0.97       1.1                      +-------+-----------+-----------+------------+------------+ Left   BKA        BKA        0.35                     +-------+-----------+-----------+------------+------------+  Summary: Right: Resting right ankle-brachial index is within normal range. The right toe-brachial index is normal. *See table(s) above for measurements and observations.  Electronically signed by Deitra Mayo MD on 11/09/2022 at 7:32:45 PM.    Final    DG Foot 2 Views Right  Result Date: 11/09/2022 CLINICAL DATA:  Wound EXAM: RIGHT FOOT - 2 VIEW COMPARISON:  10/15/2022. FINDINGS: First  metatarsophalangeal degenerative changes with joint space narrowing and osteophytes. 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 Soft tissue defect consistent with ulceration posterior to the calcaneus. Large posterior calcaneal Spur. No bony destructive process identified. IMPRESSION: Degenerative changes. Soft tissue defect consistent with open wound. Electronically Signed   By: Sammie Bench M.D.   On: 11/09/2022 10:50   CT MAXILLOFACIAL W CONTRAST  Result Date: 11/07/2022 CLINICAL DATA:  Right lower jaw pain EXAM: CT MAXILLOFACIAL WITH CONTRAST TECHNIQUE: Multidetector CT imaging of the maxillofacial structures was performed with intravenous contrast. Multiplanar CT image reconstructions were also generated. RADIATION DOSE REDUCTION: This exam was performed according to the departmental dose-optimization program which includes automated exposure control, adjustment of the mA and/or kV according to patient size and/or use of iterative reconstruction technique. CONTRAST:  77mL OMNIPAQUE IOHEXOL 350 MG/ML SOLN COMPARISON:  Head CT 09/19/2022 FINDINGS: Osseous: No fracture or mandibular dislocation. Multiple maxillary periapical lucencies. 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 Orbits: Negative. No traumatic or inflammatory finding. Sinuses: Clear. Soft tissues: Negative. Limited intracranial: No significant or unexpected finding. IMPRESSION: 1. No specific widening  to account for right jaw pain. 2. Multiple maxillary periapical lucencies. 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 Electronically Signed   By: Ulyses Jarred M.D.   On: 11/07/2022 23:19   (Echo, Carotid, EGD, Colonoscopy, ERCP)    Subjective: No complains  Discharge Exam: Vitals:   12/03/22 0413 12/03/22 0840  BP: 120/71   Pulse: 76   Resp: 18   Temp:    SpO2: 98% 97%   Vitals:   12/02/22  2008 12/03/22 0413 12/03/22 0500 12/03/22 0840  BP: 130/71 120/71    Pulse: 86 76    Resp: 18 18    Temp:      TempSrc:      SpO2: 97% 98%  97%  Weight:   68 kg   Height:        General: Pt is alert, awake, not in acute distress Cardiovascular: RRR, S1/S2 +, no rubs, no gallops Respiratory: CTA bilaterally, no wheezing, no rhonchi Abdominal: Soft, NT, ND, bowel sounds + Extremities: no edema, no cyanosis    The results of significant diagnostics from this hospitalization (including imaging, microbiology, ancillary and laboratory) are listed below for reference.     Microbiology: No results found for this or any previous visit (from the past 240 hour(s)).   Labs: BNP (last 3 results) Recent Labs    02/03/22 0013 09/02/22 1300 10/15/22 0636  BNP 37.7 81.2 [id number]   Basic Metabolic Panel: No results for input(s): "NA", "K", "CL", "CO2", "GLUCOSE", "BUN", "CREATININE", "CALCIUM", "MG", "PHOS" in the last 168 hours. Liver Function Tests: No results for input(s): "AST", "ALT", "ALKPHOS", "BILITOT", "PROT", "ALBUMIN" in the last 168 hours. No results for input(s): "LIPASE", "AMYLASE" in the last 168 hours. No results for input(s): "AMMONIA" in the last 168 hours. CBC: No results for input(s): "WBC", "NEUTROABS", "HGB", "HCT", "MCV", "PLT" in the last 168 hours. Cardiac Enzymes: No results for input(s): "CKTOTAL", "CKMB", "CKMBINDEX", "TROPONINI" in the last 168 hours. BNP: Invalid input(s): "POCBNP" CBG: No results for input(s): "GLUCAP" in the last 168 hours. D-Dimer No results for input(s): "DDIMER" in the last 72 hours. Hgb A1c No results for input(s): "HGBA1C" in the last 72 hours. Lipid Profile No results for input(s): "CHOL", "HDL", "LDLCALC", "TRIG", "CHOLHDL", "LDLDIRECT" in the last 72 hours. Thyroid function studies No results for input(s): "TSH", "T4TOTAL", "T3FREE", "THYROIDAB" in the last 72 hours.  Invalid input(s): "FREET3" Anemia work up No results for  input(s): "VITAMINB12", "FOLATE", "FERRITIN", "TIBC", "IRON", "RETICCTPCT" in the last 72 hours. Urinalysis    Component Value Date/Time   COLORURINE STRAW (A) 09/20/2022 0229   APPEARANCEUR CLEAR 09/20/2022 0229   LABSPEC 1.003 (L) 09/20/2022 0229   PHURINE 7.0 09/20/2022 0229   GLUCOSEU NEGATIVE 09/20/2022 0229   HGBUR NEGATIVE 09/20/2022 0229   BILIRUBINUR NEGATIVE 09/20/2022 0229   KETONESUR NEGATIVE 09/20/2022 0229   PROTEINUR NEGATIVE 09/20/2022 0229   NITRITE NEGATIVE 09/20/2022 0229   LEUKOCYTESUR NEGATIVE 09/20/2022 0229   Sepsis Labs No results for input(s): "WBC" in the last 168 hours.  Invalid input(s): "PROCALCITONIN", "LACTICIDVEN" Microbiology No results found for this or any previous visit (from the past 240 hour(s)).   Time coordinating discharge: Over 30 minutes  SIGNED:   Charlynne Cousins, MD  [facility] 12/03/2022, 1:15 PM Pager   If 7PM-7AM, please contact night-coverage [email or web address] Password [email or web address]

## 2022-12-03 NOTE — Progress Notes (Signed)
[facility] PROGRESS NOTE    Progress Note  [name]  PX:[id number] DOB: [birth year] DOA: 10/15/2022 PCP: Demetrios Isaacs, FNP     Brief Narrative:   Ronald [name] is an 67 y.o. male past medical history of polysubstance abuse including alcohol cocaine, noncompliance right healed unstageable pressure ulcer peripheral vascular disease, recently discharged from the hospital home as he refused SNF comes into the hospital for acute encephalopathy, hypoxic and hypercarbic noted to be aspirating, required intubation admitted to the ICU extubated on 10/16/2022 transferred to [facility] on 10/18/2022 currently awaiting skilled nursing facility placement. MRI of the foot show large ulceration and underlying osteomyelitis no abscess.   Assessment/Plan:   Unstageable right heel pressure ulcer with early osteomyelitis: Orthopedic surgery was consulted and performed amputation on 11/17/2022, wound VAC now removed on wet-to-dry dressing changes. Now afebrile, he completed his course of antibiotics on 12/02/2022. PT OT evaluated the patient, awaiting skilled nursing facility placement.  Acute respiratory failure with hypoxia and hypercarbia: Has been wean to room air.  Proteus Mirabella's pneumonia: Completed course of Augmentin.  Acute on chronic hyponatremia in the setting of beer potomania: Has returned to his baseline of 130-1 34.  Dysphagia: Tolerating a dysphagia 3 diet.  Electrolyte imbalance hypokalemia/hypomagnesemia: Monitor and replete as indicated.  Right jaw pain: CT scan showed no abscess.  Prediabetes mellitus type II: With an A1c of 5.6.  Chronic alcohol abuse/cocaine abuse: Continue folic acid thiamine and multivitamins no signs of withdrawal.  Peripheral vascular disease: History of left BKA.  Right elbow swelling cellulitis versus thrombophlebitis: Noted.  Stage II ischial tuberosity pressure ulcer present on admission RN Pressure Injury  Documentation: Pressure Injury 10/15/22 Buttocks Mid;Upper Stage 1 -  Intact skin with non-blanchable redness of a localized area usually over a bony prominence. (Active)  10/15/22 1100  Location: Buttocks  Location Orientation: Mid;Upper  Staging: Stage 1 -  Intact skin with non-blanchable redness of a localized area usually over a bony prominence.  Wound Description (Comments):   Present on Admission:   Dressing Type Foam - Lift dressing to assess site every shift 11/29/22 2021    DVT prophylaxis: lovenox Family Communication:none Status is: Inpatient Remains inpatient appropriate because: Osteomyelitis    Code Status:     Code Status Orders  (From admission, onward)           Start     Ordered   10/15/22 0927  Full code  Continuous       Question:  By:  Answer:  Consent: discussion documented in EHR   10/15/22 0927           Code Status History     Date Active Date Inactive Code Status Order ID Comments User Context   09/19/2022 2106 09/30/2022 1914 Full Code [id number]  Etta Quill, DO ED   09/02/2022 1722 09/18/2022 0032 Full Code [id number]  Erick Colace, NP ED   04/15/2022 0038 04/16/2022 1817 Full Code [id number]  Etta Quill, DO ED   03/18/2019 0228 03/26/2019 2142 Full Code [id number]  Milagros Loll, MD ED   08/29/2017 1901 08/31/2017 2307 Full Code [id number]  Valinda Party, DO Inpatient   01/23/2017 0427 01/23/2017 1224 Full Code [id number]  Charlann Lange, PA-C ED   06/19/2014 0249 06/19/2014 1817 Full Code [id number]  Rise Patience, MD Inpatient   12/28/2013 1854 01/01/2014 2253 Full Code [id number]  Newt Minion, MD Inpatient   12/25/2013 1352 12/28/2013 1854 Full Code [id number]  Consuelo Pandy,  PA-C Inpatient   11/06/2013 2025 11/10/2013 2336 Full Code [id number]  Sheila Oats, MD Inpatient   09/19/2013 0234 09/26/2013 2115 Full Code [id number]  Etta Quill, DO ED         IV Access:   Peripheral IV   Procedures and  diagnostic studies:   No results found.   Medical Consultants:   None.   Subjective:    Ronald [name] no complains  Objective:    Vitals:   12/02/22 1500 12/02/22 2008 12/03/22 0413 12/03/22 0500  BP: 132/84 130/71 120/71   Pulse: 74 86 76   Resp: 18 18 18    Temp: 98 F (36.7 C)     TempSrc: Oral     SpO2: 98% 97% 98%   Weight:    68 kg  Height:       SpO2: 98 % O2 Flow Rate (L/min): 2 L/min FiO2 (%): 40 %   Intake/Output Summary (Last 24 hours) at 12/03/2022 0750 Last data filed at 12/03/2022 0400 Gross per 24 hour  Intake 600 ml  Output 2850 ml  Net -2250 ml    Filed Weights   11/27/22 0500 12/02/22 0703 12/03/22 0500  Weight: 66.2 kg 66 kg 68 kg    Exam: General exam: In no acute distress. Respiratory system: Good air movement and clear to auscultation. Cardiovascular system: S1 & S2 heard, RRR. No JVD. Gastrointestinal system: Abdomen is nondistended, soft and nontender.  Extremities: No pedal edema. Skin: No rashes, lesions or ulcers Psychiatry: Judgement and insight appear normal. Mood & affect appropriate.   Data Reviewed:    Labs: Basic Metabolic Panel: No results for input(s): "NA", "K", "CL", "CO2", "GLUCOSE", "BUN", "CREATININE", "CALCIUM", "MG", "PHOS" in the last 168 hours.  GFR Estimated Creatinine Clearance: 86.1 mL/min (A) (by C-G formula based on SCr of 0.52 mg/dL (L)). Liver Function Tests: No results for input(s): "AST", "ALT", "ALKPHOS", "BILITOT", "PROT", "ALBUMIN" in the last 168 hours.  No results for input(s): "LIPASE", "AMYLASE" in the last 168 hours. No results for input(s): "AMMONIA" in the last 168 hours. Coagulation profile No results for input(s): "INR", "PROTIME" in the last 168 hours. COVID-19 Labs  No results for input(s): "DDIMER", "FERRITIN", "LDH", "CRP" in the last 72 hours.  Lab Results  Component Value Date   SARSCOV2NAA NEGATIVE 10/15/2022   SARSCOV2NAA NEGATIVE 09/19/2022   [place] NEGATIVE  10/18/2020   [place] NEGATIVE 03/18/2019    CBC: No results for input(s): "WBC", "NEUTROABS", "HGB", "HCT", "MCV", "PLT" in the last 168 hours.  Cardiac Enzymes: No results for input(s): "CKTOTAL", "CKMB", "CKMBINDEX", "TROPONINI" in the last 168 hours. BNP (last 3 results) No results for input(s): "PROBNP" in the last 8760 hours. CBG: No results for input(s): "GLUCAP" in the last 168 hours. D-Dimer: No results for input(s): "DDIMER" in the last 72 hours. Hgb A1c: No results for input(s): "HGBA1C" in the last 72 hours. Lipid Profile: No results for input(s): "CHOL", "HDL", "LDLCALC", "TRIG", "CHOLHDL", "LDLDIRECT" in the last 72 hours. Thyroid function studies: No results for input(s): "TSH", "T4TOTAL", "T3FREE", "THYROIDAB" in the last 72 hours.  Invalid input(s): "FREET3" Anemia work up: No results for input(s): "VITAMINB12", "FOLATE", "FERRITIN", "TIBC", "IRON", "RETICCTPCT" in the last 72 hours. Sepsis Labs: No results for input(s): "PROCALCITON", "WBC", "LATICACIDVEN" in the last 168 hours.  Microbiology No results found for this or any previous visit (from the past 240 hour(s)).    Medications:    vitamin C  1,000 mg Oral Daily   docusate sodium  100 mg Oral Daily   feeding supplement (NEPRO CARB STEADY)  237 mL Oral Q24H   fentaNYL (SUBLIMAZE) injection  50 mcg Intravenous Once   fluticasone furoate-vilanterol  1 puff Inhalation Daily   folic acid  1 mg Oral Daily   heparin  5,000 Units Subcutaneous Q8H   magnesium oxide  400 mg Oral Daily   metoprolol tartrate  50 mg Oral BID   multivitamin with minerals  1 tablet Oral Daily   nutrition supplement (JUVEN)  1 packet Oral BID BM   pantoprazole  40 mg Oral Daily   polyethylene glycol  17 g Oral Daily   thiamine  100 mg Oral Daily   Continuous Infusions:  sodium chloride 10 mL/hr at 11/19/22 1852   magnesium sulfate bolus IVPB        LOS: 49 days   Charlynne Cousins  [facility]  12/03/2022, 7:50 AM

## 2022-12-03 NOTE — Progress Notes (Signed)
Nutrition Brief Note  Patient remains medically stable for discharge. Disposition pending.   He continues with adequate PO intake. Meal completions of 100%. Also consuming multiple snacks daily, Nepro shakes once daily and Juven at least once daily.   He has had no changes in status. Reviewed documented weights which have appeared variable however noted to trend up throughout admission. Continues with regular bowel movements.   RD will follow up as appropriate or if change of status noted. Please re-consult if additional nutrition related needs arise.   Clayborne Dana, RDN, LDN Clinical Nutrition

## 2022-12-03 NOTE — TOC Transition Note (Signed)
Transition of Care [facility]) - CM/SW Discharge Note   Patient Details  Name: [name] MRN: [id number] Date of Birth: 1955-12-19  Transition of Care [facility]) CM/SW Contact:  Joanne Chars, LCSW Phone Number: 12/03/2022, 1:57 PM   Clinical Narrative:   Pt discharging to [facility].  RN call report to [phone].    Final next level of care: Skilled Nursing Facility Barriers to Discharge: Barriers Resolved   Patient Goals and CMS Choice      Discharge Placement                Patient chooses bed at:  ([facility]) Patient to be transferred to facility by: [place] Name of family member notified: sisters [name] and [facility] in room Patient and family notified of of transfer: 12/03/22  Discharge Plan and Services Additional resources added to the After Visit Summary for   In-house Referral: Clinical Social Work                                   Social Determinants of Health ([place]) Interventions SDOH Screenings   Food Insecurity: No Food Insecurity (10/17/2022)  Recent Concern: Food Insecurity - Food Insecurity Present (09/17/2022)  Housing: Medium Risk (10/17/2022)  Transportation Needs: Unmet Transportation Needs (10/17/2022)  Utilities: Not At Risk (10/17/2022)  Tobacco Use: High Risk (11/18/2022)     Readmission Risk Interventions     No data to display

## 2022-12-03 NOTE — TOC Progression Note (Addendum)
Transition of Care [facility]) - Progression Note    Patient Details  Name: [name] MRN: [id number] Date of Birth: 1955-10-06  Transition of Care [facility]) CM/SW Contact  Joanne Chars, [place] Phone Number: 12/03/2022, 9:49 AM  Clinical Narrative:    0830: CSW attempting to fax PCS referral to [employer].  0930: Message from [facility].  They can offer STR bed for pt.  CSW spoke with pt and informed him of bed offer.  Pt stating he does not want to leave city of [place] for placement. CSW spoke with pt sister [name] and informed her of bed offer.  She will be by the hospital after lunch today.  1300: Pt, CSW, Olga Coaster, pt sisters [name] and [employer] all discussed bed offer at [facility].  Pt initially remained resistant, pt niece Danae Chen joined the conversation by phone, CSW and Olga Coaster stepped out to let them discuss. Shortly thereafter, CSW and Hassan Rowan called back into the room and pt is in agreement with plan to DC to [facility].  Contact information provided to family.  [facility] confirms they have [place].    Expected Discharge Plan: [place] Barriers to Discharge: Continued Medical Work up  Expected Discharge Plan and Services In-house Referral: Clinical Social Work     Living arrangements for the past 2 months: Single Family Home                                       Social Determinants of Health (SDOH) Interventions SDOH Screenings   Food Insecurity: No Food Insecurity (10/17/2022)  Recent Concern: Food Insecurity - Food Insecurity Present (09/17/2022)  Housing: Medium Risk (10/17/2022)  Transportation Needs: Unmet Transportation Needs (10/17/2022)  Utilities: Not At Risk (10/17/2022)  Tobacco Use: High Risk (11/18/2022)    Readmission Risk Interventions     No data to display

## 2022-12-03 NOTE — Progress Notes (Signed)
Report attempted to [facility] - left voicemail for [facility] -unit manager for a call back

## 2022-12-21 ENCOUNTER — Encounter: Payer: Medicare Other | Admitting: Family

## 2022-12-24 ENCOUNTER — Ambulatory Visit (INDEPENDENT_AMBULATORY_CARE_PROVIDER_SITE_OTHER): Payer: Medicare Other | Admitting: Family

## 2022-12-24 ENCOUNTER — Encounter: Payer: Self-pay | Admitting: Family

## 2022-12-24 DIAGNOSIS — Z89511 Acquired absence of right leg below knee: Secondary | ICD-10-CM

## 2022-12-24 NOTE — Progress Notes (Signed)
Office Visit Note   Patient: Ronald [name]           Date of Birth: [birth year]           MRN: [id number] Visit Date: 12/24/2022              Requested by: Donato Schultz, FNP [address] [place],  [place] [address] PCP: Donato Schultz, FNP  Chief Complaint  Patient presents with   Right Leg - Routine Post Op    11/17/22 right bka      HPI: The patient is a 67 year old gentleman seen status post right below-knee amputation March 6.  He has been residing at rehab have been in [facility]  Assessment & Plan: Visit Diagnoses: No diagnosis found.  Plan: Given an order for prosthesis set up on the right.  Patient will call for an appointment.  Wear shrinker around-the-clock once obtained.  May continue daily Dial soap cleansing  Follow-Up Instructions: No follow-ups on file.   Ortho Exam  Patient is alert, oriented, no adenopathy, well-dressed, normal affect, normal respiratory effort. On examination of the right residual limb staples are in place incision well-approximated well-healed staples harvested today there is no gaping or drainage no erythema limb is well consolidated  Imaging: No results found. No images are attached to the encounter.  Labs: Lab Results  Component Value Date   HGBA1C 5.6 09/02/2022   HGBA1C 5.9 (H) 11/09/2013   ESRSEDRATE 22 (H) 10/19/2022   ESRSEDRATE 31 (H) 11/07/2013   CRP 0.7 10/19/2022   CRP 5.6 (H) 11/07/2013   LABURIC 8.8 (H) 11/06/2013   REPTSTATUS 10/19/2022 FINAL 10/16/2022   GRAMSTAIN  10/16/2022    FEW WBC PRESENT, PREDOMINANTLY PMN NO ORGANISMS SEEN Performed at [facility], [address], [place], [place] [address]    CULT RARE PROTEUS MIRABILIS 10/16/2022   LABORGA PROTEUS MIRABILIS 10/16/2022     Lab Results  Component Value Date   ALBUMIN 3.8 11/14/2022   ALBUMIN 3.4 (L) 11/01/2022   ALBUMIN 3.8 10/15/2022    Lab Results  Component Value Date   MG 1.7 11/16/2022   MG 1.7 11/14/2022   MG  1.6 (L) 11/01/2022   No results found for: "VD25OH"  No results found for: "PREALBUMIN"    Latest Ref Rng & Units 11/16/2022    4:00 AM 11/14/2022    8:05 AM 11/08/2022    2:57 AM  CBC EXTENDED  WBC 4.0 - 10.5 K/uL 8.5  7.9  7.5   RBC 4.22 - 5.81 MIL/uL 3.29  3.53  3.34   Hemoglobin 13.0 - 17.0 g/dL 85.2  77.8  24.2   HCT 39.0 - 52.0 % 31.0  33.8  31.8   Platelets 150 - 400 K/uL 320  403  405      There is no height or weight on file to calculate BMI.  Orders:  No orders of the defined types were placed in this encounter.  No orders of the defined types were placed in this encounter.    Procedures: No procedures performed  Clinical Data: No additional findings.  ROS:  All other systems negative, except as noted in the HPI. Review of Systems  Objective: Vital Signs: There were no vitals taken for this visit.  Specialty Comments:  No specialty comments available.  PMFS History: Patient Active Problem List   Diagnosis Date Noted   Acute osteomyelitis of right calcaneus 11/10/2022   PVD (peripheral vascular disease) 11/10/2022   Respiratory  failure 10/15/2022   Chronic hypercapnic respiratory failure 09/19/2022   Protein-calorie malnutrition, severe 09/04/2022   Acute hypercapnic respiratory failure 09/02/2022   Hx of BKA, left 09/02/2022   Hypotension 09/02/2022   Right bundle branch block 09/02/2022   Acute respiratory failure 09/02/2022   Hyponatremia 03/26/2019   Altered mental status 03/26/2019   Acute hypoxemic respiratory failure 03/26/2019   Dysphagia 03/26/2019   Acute encephalopathy 03/18/2019   COPD exacerbation 08/29/2017   Subdural hematoma 06/19/2014   Critical lower limb ischemia 12/25/2013   Subacute osteomyelitis, right ankle and foot 11/06/2013   HTN (hypertension), benign 11/06/2013   Alcohol abuse 11/06/2013   Osteomyelitis of left foot 11/06/2013   COPD (chronic obstructive pulmonary disease) 11/06/2013   Accelerated hypertension  09/25/2013   Hypokalemia 09/25/2013   Abscess of left foot 09/21/2013   Cellulitis of left foot 09/19/2013   Past Medical History:  Diagnosis Date   Critical lower limb ischemia (HCC)    ETOH abuse    Gout    Hypertension     Family History  Problem Relation Age of Onset   Diabetes Mellitus II Mother    CAD Neg Hx    Stroke Neg Hx     Past Surgical History:  Procedure Laterality Date   ABDOMINAL AORTAGRAM N/A 12/26/2013   Procedure: ABDOMINAL Ronny Flurry;  Surgeon: [name], MD;  Location: [facility] CATH LAB;  Service: Cardiovascular;  Laterality: N/A;   AMPUTATION Left 12/28/2013   Procedure: LEFT BELOW KNEE AMPUTATION;  Surgeon: Nadara Mustard, MD;  Location: [facility] OR;  Service: Orthopedics;  Laterality: Left;   AMPUTATION Right 11/17/2022   Procedure: RIGHT BELOW KNEE AMPUTATION;  Surgeon: Nadara Mustard, MD;  Location: [facility] OR;  Service: Orthopedics;  Laterality: Right;   BELOW KNEE LEG AMPUTATION     Left   LEG SURGERY     Social History   Occupational History   Not on file  Tobacco Use   Smoking status: Every Day    Packs/day: 0.50    Years: 40.00    Additional pack years: 0.00    Total pack years: 20.00    Types: Cigarettes   Smokeless tobacco: Never  Vaping Use   Vaping Use: Every day  Substance and Sexual Activity   Alcohol use: Yes    Alcohol/week: 120.0 standard drinks of alcohol    Types: 120 Cans of beer per week    Comment: 2 -3 40 oz beers per day   Drug use: Yes    Types: Cocaine, Marijuana    Comment: crack   Sexual activity: Not on file

## 2023-09-14 NOTE — Consults (Signed)
 Nutrition Note   PATIENT NAME: Ronald  [name] DATE: 09/14/2023  TIME: 12:21 PM  Note Type: Assessment Referral Reason: Other (Cortrak feeding tube placement ordered.)  Assessment  Per MD  note: 68 y.o. 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  Nutrition History: Per MBS 12/31, SLP recommends NPO. Cortrak feeding tube placement is ordered. Pt is ordered IV thiamine  and po folic acid  and MVI.    Interventions  Nutrition Intervention: Collaboration and referral of nutrition care, Enteral nutrition  When enteral feeding access obtained, recommend initiate Osmolite 1.5 at 20 mL/hr, advance by 20 mL/hr every 4 hours to goal rate 60 mL/hr to provide 2160 kcal, 91 g protein, 1094 mL free water.   Recommend standard water flushes 30 mL every 4 hours for tube patency. Additional water per team.  PO diet per SLP.     Nutrition Diagnosis   Nutrition Diagnosis: Inadequate oral intake Problem Related To: Dysphagia Problem as Evidenced By: NPO per [facility] 12/31    Nutrition Focused Physical Findings  Deferred Due To: NFPE not indicated  Muscle Mass Depletion:     Body Fat Depletion:              Pertinent Information  Anthropometrics Height: 154.9 cm (5' 1) Weight: 81.5 kg (179 lb 10.8 oz) Weight History/Comments: 1/1- 179# Wt Readings from Last 10 Encounters:  09/14/23 81.5 kg (179 lb 10.8 oz)  07/13/23 83.9 kg (185 lb)  03/17/23 80.2 kg (176 lb 14.4 oz)  12/15/22 65 kg (143 lb 4.8 oz)   Weight Classification: Overweight, For adult > 86 years old BMI: 36.2 adjusted for bilateral BKA Skin Integrity: no PI documented Nutrition Related Medications: reviewed Scheduled Meds:budesonide , 0.5 mg, nebulization, RBID busPIRone , 5 mg, oral, At Bedtime cefTRIAXone , 2 g,  intravenous, Q24H doxycycline, 100 mg, intravenous, Q12H enoxaparin , 40 mg, subcutaneous, At Bedtime folic acid , 1 mg, oral, Daily formoterol , 20 mcg, nebulization, RBID multivitamin with minerals, 1 tablet, oral, Daily nicotine , 1 patch, transdermal, Nightly pantoprazole , 40 mg, intravenous, Q24H SCH revefenacin , 175 mcg, nebulization, RDaily thiamine , 500 mg, intravenous, Daily  Followed by NOREEN ON 09/17/2023] thiamine , 100 mg, oral, Daily   Continuous Infusions:  PRN Meds:..  dextrose  .  dextrose  .  ipratropium-albuteroL  .  polyethylene glycol  Labs: Na 127, BUN 5, creatinine 0.4, glucose 62-136 GI Symptoms: None Last BM Date:  (pta)    Current Diet and Nutrition Support Details  Diet NPO     Estimated Needs  Nutrition Needs Based On: Actual weight    Calories (kcal/day): 2040  Calories based on:  (25 kcal/kg)  Protein (g/day): 82-98  Protein based on: 1-1.2g/kg, ABW  Fluid (mL/day):   1-1.1 mL/kcal    Monitoring & Evaluation  Nutrition Goals: Tolerance to nutrition support Nutrition Goal Status: Initial   Follow-up Information  Follow-Up Date: 09/21/23 Follow-Up Reason: Reassessment   Contact RD on treatment team. After hours or weekends, use secure chat group: [facility]  [facility] Adult Dietitians [place]  [facility] Dietitians [facility]  [facility] Dietitians [facility]  [facility] Dietitians [facility]  [facility] Dietitians   Ronald [name], Ronald [name] 09/14/2023 12:21 PM

## 2023-09-15 NOTE — Consults (Signed)
 Cortrak note:  Consulted for small bore feeding tube placement. After patient screened for contraindications, Postpyloric, D2 feeding tube placed at bedside by Cortrak team.   Unable to pass tube through right nare. Tube inserted through Left nare with placement depth at 107 cm/10 Fr. Secured in place with CORGRIP tube retention system. Tube flushes easily.    Patient tolerated procedure well. Bedside RN notified. X-ray ordered for placement verification per policy. Order for small bore feeding tube placement to be discontinued.

## 2023-09-15 NOTE — Unmapped External Note (Signed)
 Acute Physical Therapy Evaluation  Visit Count: PT Visit Count: 1  Precautions: Other Therapy Precautions: Fall risk Comment: PMH of B BKAs  Medical Diagnosis/Course: PT Diagnosis/Course PT Medical Dx: Hyponatremia, COPD exacerbation, CAP Pertinent Medical Course: per note by Dr. Janeth on 09/14/23:  Mr. 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    Assessment and Plan  Assessment: Ronald  [name] is a 68 y.o. admitted on 09/10/2023 for COPD exacerbation and CAP.  Pt is A&O x 4.  He reports no pain at rest, and declines upright/EOB mobility, but agreeable to bed level testing and therex.  Pt performs supine BLEs therex and is educated about performing these exercises throughout the day to prepare him for ambulation with BLE prostheses (pt reports that he has LLE prosthesis but does not yet have one for RLE).  Pt is interested in receiving a RLE prosthesis.  He scored 10/24 on AMPAC, indicating 76.75% functional impairment and demonstrates a moderate fall risk.  Pt is appropriate for continued acute PT to increase endurance, safety and independence with functional mobility.    PT Treatment Diagnosis:    Difficulty Walking (not elsewhere specified) and Reduced Mobility  Problem list: Impairments/Limitations: Activity Tolerance Deficits, Ambulation Deficits, Balance Deficits, Bed mobility deficits, Mobility deficits, Transfer deficits   PT Recommendation: PT Recommendation: Rehabilitation Facility PT Equipment Recommended: Defer to next level of care  PT recommends: assistance with transfers   PT Goals:  Patient and Family Goals: To return to SNF Treatment Plan/Goals Established with Patient/Caregiver: Yes  Encounter Problems     Encounter Problems (Active)     Physical  Therapy     Patient will go supine to/from sit Independently     Start:  09/15/23    Expected End:  10/13/23             Patient will perform sit-to-stand with minimal assistance using Walker-rolling and and at least 1 LE prosthesis     Start:  09/15/23    Expected End:  10/13/23             Patient will perform Lateral transfer with sliding board bed to/from chair Independently using Slideboard     Start:  09/15/23    Expected End:  10/13/23              Patient will improve dynamic sitting balance to good     Start:  09/15/23    Expected End:  10/13/23         Patient/caregiver will be able to verbalize 2 fall prevention strategies to decrease risk of falls during functional mobility     Start:  09/15/23    Expected End:  10/13/23         Pt will increase score on SPPB to at least 6/12 to demonstrate improved safety and independence with all standing and ambulation activities.      Start:  09/15/23    Expected End:  10/13/23             Rehab Potential:  Rehab Potential: Fair Impairments/Limitations: Activity Tolerance Deficits, Ambulation Deficits, Balance Deficits, Bed mobility deficits, Mobility deficits, Transfer deficits  Plan:  Patient's Prior Level of Function Needs a Little Help (AMPAC 12-21)    (2pts)   Patient's Current Level of Function (10) Needs >= Mod A (AMPAC <12)   (  1pt)   Likelihood for functional decline without therapy .  Fairly Likely    (2pts)  Likelihood  to make significant functional gains with therapy. Fairly Likely    (2pts)  Acute therapy likely to change discharge disposition. Fairly Likely    (2pts)   Recommended Frequency: 3-4 times a week (8-12 pts total)  PT Frequency: 3x week  Recommend Duration:  For 3 weeks  Recommend Interventions: Therapeutic activity, Patient/Caregiver education, Therapeutic exercise, Wheelchair management/mobility, Gait/mobility training, Self care home management  Medications, Past Medical  History, and Prior Functional Status  CURRENT MEDS: Medications Administered (24h ago, onward)     Start       09/15/23 0815  lactated ringer 's infusion  Continuous       Last Admin Time: 09/15/23 0935       09/14/23 0915  thiamine  (VITAMIN B1) injection 500 mg  (thiamine  IVPB)  Daily       Last Admin Time: 09/15/23 0944    Placed in Followed by Linked Group     09/14/23 0145  cefTRIAXone  (ROCEPHIN ) injection 2 g  Every 24 hours       Last Admin Time: 09/15/23 0118       09/13/23 1145  revefenacin  (YUPELRI ) 175 mcg/3 mL nebulizer solution 175 mcg  Daily RT       Last Admin Time: 09/15/23 0850       09/11/23 2100  enoxaparin  (LOVENOX ) syringe 40 mg  (ADULT PHARM VTE Prophylaxis)  At bedtime       Last Admin Time: 09/14/23 2052       09/11/23 0900  pantoprazole  (PROTONIX ) injection 40 mg  Every 24 hours scheduled       Last Admin Time: 09/15/23 0943       09/11/23 0345  doxycycline (VIBRAMYCIN) 100 mg in sodium chloride  0.9 % 100 mL IVPB  Every 12 hours       Last Admin Time: 09/15/23 1515       09/11/23 0125  formoterol  (PERFOROMIST ) 20 mcg/2 mL nebulizer solution 20 mcg  2 times daily RT       Last Admin Time: 09/15/23 0848                Past Medical History: Past Medical History:  Diagnosis Date  . COPD (chronic obstructive pulmonary disease) (CMD)   . Hypertension   . PVD (peripheral vascular disease) (CMD)     Past Surgical History: Past Surgical History:  Procedure Laterality Date  . BELOW KNEE LEG AMPUTATION Bilateral     Home Living Environment: Type of Home Skilled Nursing Facility  Home Layout    Exterior Stairs - number    Exterior Stairs - rails    Interior Stairs - number    Interior Stairs - Administrator, Sports / Tub Retail Buyer Slideboard, [employer]  Equipment Currently Using    Additional Comments     Prior Level of Function: Level of Independence Requiring  assistance  Lives With    [employer]) able to assist at d/c    Patient Responsibilities    Requires Assist With Transfers, LE Bathing, LE Dressing, Toileting/Hygiene    SUBJECTIVE  Communication:  Communication: Patient, Nursing Communication Details: role of PT, importance of mobility, RN approved PT session and notified of pt performance  General Family/Caregiver Present: none Fall in the last year?: Yes Fall/Injury Details: 2 falls, once OOB and once transferring to w/c  while using a slide board Feel unsteady or off balance?: Yes  Subjective: Pt is pleasant and cooperative.   Pain: Pain Assessment Pain Assessment: No/denies pain Patient's Stated Pain Goal: No pain  OBJECTIVE  Vitals:  Vitals:   09/15/23 1200  BP:   Pulse:   Resp:   Temp:   SpO2: 90%     Cognition: Orientation Level: Oriented x4    Range of Motion: Overall ROM Assessment: Within Functional Limits  Strength: Overall Strength Assessment: Within Functional Limits   Tone Assessment Tone Assessment: Within Functional Limits  Skin Integrity and Edema:  Skin Integrity & Edema Skin: Intact in visualized areas Edema: No edema   Balance: Balance Additional Comments: UTA, pt declined upright/EOB  Functional Mobility:  Not assessed this session, pt declined  Interventions: None performed this visit     Outcomes AM-PAC - Basic Mobility:    Flowsheet Row Most Recent Value  AM-PAC 6-Clicks - Basic Mobility  Turning from you back to your side while in a flat bed without using bed rails? A little  Moving from lying on your back to sitting on the side of a flat bed without using bed rails? A lot  Moving to and from a bed to a chair (including a wheelchair)? A lot  Standing up from a chair using your arms (e.g, wheelchair, or bedside chair)? Total Assist  To walk in a hospital room? Total Assist  Climbing 3-5 steps with a railing? Total Assist  AM-PAC Total Score 10     Condition After  Therapy:  Back to bed, Nursing notified of condition, All needs within reach, Lines intact   Treatment Times Time In: 1402 Time Out: 1416 Time in Timed codes:   Total Treatment Time: 14 PT Eval Low Complexity minutes: 14       Charges           09/15/2023   Code Description Service Provider Modifiers Quantity  [id number] Hc Pt Physical Therapy Evaluation Low Complex 20 Mins Amy Dexter, PT GP 1        Time of Service Note Type Status  None

## 2023-09-15 NOTE — Consults (Signed)
 Nutrition Note   PATIENT NAME: Ronald  [name] DATE: 09/15/2023  TIME: 9:28 AM  Note Type: Brief note Referral Reason: Nutrition consult, TF  Assessment  Per MD  note: 68 y.o. 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  Nutrition History: Spoke with Ronald [name]. Waiting on feeding tube placement, has been ordered. Plans to start IVF. Please see previous nutrition note for full nutrition assessment. RD to follow up per protocol.   RD ordered: Osmolite 1.5 at 20 mL/hr, advance by 20 mL/hr every 4 hours to goal rate 60 mL/hr to provide 2160 kcal, 91 g protein, 1094 mL free water.  Suggest minimal FWF of 30 mL q4h Diet advancement per SLP recommendations   Estimated Needs  Nutrition Needs Based On: Actual weight    Calories (kcal/day): 2040  Calories based on:  (25 kcal/kg)  Protein (g/day): 82-98  Protein based on: 1-1.2g/kg, ABW  Fluid (mL/day):   1-1.1 mL/kcal       Follow-up Information  Follow-Up Date: 09/20/23 Follow-Up Reason: Reassessment, Tolerance of tube feeding   Contact RD on treatment team. After hours or weekends, use secure chat group: [facility]  [facility] Adult Dietitians [place]  [facility] Dietitians [facility]  [facility] Dietitians [facility]  [facility] Dietitians [facility]  [facility] Dietitians   Ronald [name], RD 09/15/2023 9:28 AM

## 2023-09-16 NOTE — Discharge Summary (Signed)
 ------------------------------------------------------------------------------- Attestation signed by Logan Cassis, MD at 09/19/2023  3:34 PM I have personally seen and performed a physical examination on patient Ronald  [name] . I discussed the documented exam with the resident and agree, and have added my changes below. I have participated in the medical decision making process and agree with the documentation of the Resident as consistent with my own findings, exam and plan as amended or described in this note.  Ronald [name] is a long-term care resident at [facility] where he's been drinking a lot of water provided by his sisters (reportedly one case per week, in addition to unknown amount of alcohol ), likely contributing to his repeated hyponatremia. He has cognitive impairment at baseline though not officially diagnosed with dementia. His sisters say that he's always been at times irrational, such as thinking that his midline should not be there despite multiple reassurances. They're trying to move him to a sister facility affiliated with [facility] in [place] for closer family visiting. His ACTH  was collected at the time of being on steroids for COPD exacerbation, thus inaccurate.   -------------------------------------------------------------------------------    Gerontology (ACE Unit) Discharge Summary  Name: Ronald [name] MRN: [id number] Age: 68 yrs DOB: [birth year]  Admit date: 09/10/2023   Discharge date and time: 09/19/2023  Admitting Physician: Powell Macario Gaskins, MD Discharge Physician: : Logan Cassis, MD  Admission Diagnoses:  Hyponatremia [E87.1] Acute respiratory failure with hypoxia and hypercarbia (CMD) [J96.01, J96.02]   Discharge Diagnoses:  Acute on chronic hyponatremia Acute on chronic hypercapnic respiratory failure Community-acquired pneumonia COPD exacerbation Acute encephalopathy  Admission Condition: poor Discharged Condition: fair  Hospital Course:  For full  details, please see H&P, progress notes, consult notes and ancillary notes. 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  He does have a history of chronic hyponatremia with baseline around 130 and was previously hospitalized in October for similar issue.  He was admitted to the MICU and required hypertonic saline for his hyponatremia.  His course was complicated by overcorrection and he required DDAVP as well.  He was started on BiPAP for concurrent respiratory failure.  He was treated for COPD exacerbation with steroids, nebulizers and additional oxygen support.  He was also treated for community-acquired pneumonia with antibiotics. He was seen by the dietitian who recommended Dobbhoff placement and tube feeds due to poor nutrition.  His mental status gradually improved and his sodium was 127 at time of transfer to the floor.  His respiratory status also improved with treatment, he remains on 4 L at time of transfer to floor.  He had initially failed his swallow evaluation with aspiration in all phases, so Dobbhoff tube was continued to assist with nutrition in the setting of his hyponatremia.  Family was engaged to discuss goals of care and whether patient would want a PEG tube for further feeds.  However on 1/4 he pulled out his feeding tube and was reevaluated by SLP with resolution of his dysphagia and was started on a soft and bite-size diet.  Patient was discharged on this diet with goal to advance as indicated.  His hyponatremia was likely caused by poor p.o. intake in the setting of drinking a lot of water and not liking the food at his rehab facility.  His sisters also mentioned  that he is known to drink alcohol , even at the rehab facility.  Patient is on 09/18/2022 removed midline, no  trauma or bleeding from the arm appreciated.  Also became alert and oriented x 3 during his time, but cannot describe as to why he removed the midline, stated that did not appear to be working for him.  Was working prior, and even demonstrated prior with patient that was able to remove blood.  On the day of discharge, patient evaluated by the attending, questions answered, and deemed medically ready for discharge to skilled nursing facility.  Patient to stop taking clonidine, will have blood pressure control with amlodipine  10 mg daily.  Patient also started taking buspirone  5 mg tablet daily, nicotine  patches, and daily GlycoLax  for constipation.  For outpatient PCP, will need follow-up in 1 to 2 weeks, will need to check BMP for hyponatremia at that time.  Also will need to continue dysphagia diet with soft and bite-size foods, small sips as well  Tests Pending at the time of discharge: Pending Labs     Order Current Status   Antidiuretic Hormone (ADH) In process       Discharge Follow-up Action Items: Follow-up BMP for hyponatremia in 1 to 2 weeks Encourage p.o. intake and adequate amount of water Continue dysphagia diet with soft and bite-size foods, advance as tolerated Follow-up with PCP in 1 to 2 weeks ADH lab currently still pending from hyponatremia evaluation   Patient's Ordered Code Status: Full Code  Consults: IP CONSULT TO NUTRITION SERVICES  CBC:  Results from last 7 days  Lab Units 09/17/23 0827 09/16/23 0532 09/14/23 2327 09/14/23 0206  WHITE BLOOD CELL COUNT 10*3/uL 10.10 9.90 9.60 9.60  HEMOGLOBIN g/dL 86.0* 86.9* 86.6* 87.7*  HEMATOCRIT % 41.3* 38.9* 39.1* 36.2*  PLATELET COUNT 10*3/uL 473* 451* 458* 387   CMP:  Results from last 7 days  Lab Units 09/19/23 0840 09/18/23 2026 09/18/23 0317 09/17/23 2022 09/17/23 0827 09/16/23 1711 09/16/23 0532  SODIUM mmol/L 130* 131* 135* 133* 134*   < > 132*  POTASSIUM mmol/L 4.7 4.7 4.7 4.4 4.6   < > 4.1   CHLORIDE mmol/L 94* 96* 99 98 96*   < > 92*  CO2 mmol/L 26 29 29 28 29    < > 33*  BUN mg/dL 8 10 10 10 9    < > 10  CREATININE mg/dL 9.50* 9.38* 9.50* 9.46* 0.47*   < > 0.51*  CALCIUM  mg/dL 9.1 8.8 9.0 9.0 8.9   < > 9.1  MAGNESIUM  mg/dL 1.8*  --  1.6*  --  1.8*  --  1.8*  PHOSPHORUS mg/dL 3.9  --  4.3  --  3.0  --  2.6  ANION GAP mmol/L 10 6 7 7 9    < > 7   < > = values in this interval not displayed.    Disposition: Skilled Nursing Facility   Patient Instructions:    Discharge Medications     New Medications      Sig Disp Refill Start End  busPIRone  5 mg tablet Commonly known as: BUSPAR   Take 1 tablet (5 mg total) by mouth at bedtime.  30 tablet  0     nicotine  14 mg/24 hr patch Commonly known as: NICODERM CQ   Place 1 patch on the skin nightly.  30 patch  0     polyethylene glycol 17 gram packet Commonly known as: GLYCOLAX   Take 17 g by mouth daily for 14 days.  238 g  0         Medications To Continue      Sig Disp Refill Start End  acetaminophen  325 mg tablet Commonly known as: TYLENOL   Take 650 mg by mouth every 6 (six) hours as needed (For pain.).   0     Advair  Diskus 250-50 mcg/dose diskus inhaler Generic drug: fluticasone  propion-salmeteroL  Inhale 1 puff  in the morning and 1 puff before bedtime.   0     amLODIPine  10 mg tablet Commonly known as: NORVASC   Take 10 mg by mouth daily.   0     docusate sodium  100 mg capsule Commonly known as: COLACE  Take 100 mg by mouth daily.   0     ergocalciferol 200 mcg/mL (8,000 unit/mL) Drop drops Commonly known as: VITAMIN D2  Take 0.25 mL (2,000 Units total) by mouth daily.   0     folic acid  400 mcg tablet Commonly known as: FOLVITE   Take 400 mcg by mouth daily.   0     melatonin 3 mg tablet  Take 3 mg by mouth at bedtime.   0     metoprolol  tartrate 50 mg tablet Commonly known as: LOPRESSOR   Take 50 mg by mouth 2 (two) times a day.   0     pantoprazole  40 mg EC tablet Commonly known  as: PROTONIX   Take 40 mg by mouth every morning before breakfast.   0     thiamine  100 mg tablet Commonly known as: VITAMIN B1  Take 100 mg by mouth daily.   0     Ventolin  HFA 90 mcg/actuation inhaler Generic drug: albuterol  HFA  Inhale 2 puffs every 6 (six) hours as needed for shortness of breath.   0         Stopped Medications    cloNIDine 0.1 mg tablet Commonly known as: CATAPRES          Follow-up  Future Appointments  Date Time Provider Department Center  10/04/2023 10:00 AM [facility] CT [facility]  10/31/2023  8:30 AM Katheryn Derral Hind, NP [facility] None     Electronically signed by: Ozell Signe Hipp, MD 09/19/2023

## 2023-11-22 ENCOUNTER — Other Ambulatory Visit (HOSPITAL_COMMUNITY): Payer: Self-pay

## 2024-02-17 ENCOUNTER — Inpatient Hospital Stay (HOSPITAL_COMMUNITY)

## 2024-02-17 ENCOUNTER — Encounter (HOSPITAL_COMMUNITY): Payer: Self-pay

## 2024-02-17 ENCOUNTER — Inpatient Hospital Stay (HOSPITAL_COMMUNITY)
Admission: EM | Admit: 2024-02-17 | Discharge: 2024-02-23 | DRG: 208 | Disposition: A | Discharge status: Other Acute Inpatient Hospital | Source: Other Acute Inpatient Hospital | Attending: Internal Medicine | Admitting: Internal Medicine

## 2024-02-17 ENCOUNTER — Emergency Department (HOSPITAL_COMMUNITY)

## 2024-02-17 DIAGNOSIS — R911 Solitary pulmonary nodule: Secondary | ICD-10-CM | POA: Diagnosis present

## 2024-02-17 DIAGNOSIS — G9340 Encephalopathy, unspecified: Secondary | ICD-10-CM | POA: Diagnosis not present

## 2024-02-17 DIAGNOSIS — J189 Pneumonia, unspecified organism: Secondary | ICD-10-CM | POA: Diagnosis present

## 2024-02-17 DIAGNOSIS — E222 Syndrome of inappropriate secretion of antidiuretic hormone: Secondary | ICD-10-CM | POA: Diagnosis present

## 2024-02-17 DIAGNOSIS — Z7951 Long term (current) use of inhaled steroids: Secondary | ICD-10-CM

## 2024-02-17 DIAGNOSIS — F17210 Nicotine dependence, cigarettes, uncomplicated: Secondary | ICD-10-CM | POA: Diagnosis present

## 2024-02-17 DIAGNOSIS — E039 Hypothyroidism, unspecified: Secondary | ICD-10-CM | POA: Diagnosis present

## 2024-02-17 DIAGNOSIS — Z833 Family history of diabetes mellitus: Secondary | ICD-10-CM | POA: Diagnosis not present

## 2024-02-17 DIAGNOSIS — E2740 Unspecified adrenocortical insufficiency: Secondary | ICD-10-CM | POA: Diagnosis present

## 2024-02-17 DIAGNOSIS — Z79899 Other long term (current) drug therapy: Secondary | ICD-10-CM | POA: Diagnosis not present

## 2024-02-17 DIAGNOSIS — E871 Hypo-osmolality and hyponatremia: Secondary | ICD-10-CM

## 2024-02-17 DIAGNOSIS — J9622 Acute and chronic respiratory failure with hypercapnia: Principal | ICD-10-CM | POA: Diagnosis present

## 2024-02-17 DIAGNOSIS — J440 Chronic obstructive pulmonary disease with acute lower respiratory infection: Secondary | ICD-10-CM | POA: Diagnosis present

## 2024-02-17 DIAGNOSIS — Z89511 Acquired absence of right leg below knee: Secondary | ICD-10-CM

## 2024-02-17 DIAGNOSIS — D649 Anemia, unspecified: Secondary | ICD-10-CM | POA: Diagnosis present

## 2024-02-17 DIAGNOSIS — Z1152 Encounter for screening for COVID-19: Secondary | ICD-10-CM | POA: Diagnosis not present

## 2024-02-17 DIAGNOSIS — Z87891 Personal history of nicotine dependence: Secondary | ICD-10-CM | POA: Diagnosis not present

## 2024-02-17 DIAGNOSIS — B349 Viral infection, unspecified: Secondary | ICD-10-CM | POA: Diagnosis present

## 2024-02-17 DIAGNOSIS — R631 Polydipsia: Secondary | ICD-10-CM | POA: Diagnosis present

## 2024-02-17 DIAGNOSIS — I10 Essential (primary) hypertension: Secondary | ICD-10-CM

## 2024-02-17 DIAGNOSIS — J9621 Acute and chronic respiratory failure with hypoxia: Secondary | ICD-10-CM | POA: Diagnosis present

## 2024-02-17 DIAGNOSIS — Z89512 Acquired absence of left leg below knee: Secondary | ICD-10-CM | POA: Diagnosis not present

## 2024-02-17 DIAGNOSIS — R739 Hyperglycemia, unspecified: Secondary | ICD-10-CM | POA: Diagnosis not present

## 2024-02-17 DIAGNOSIS — J439 Emphysema, unspecified: Secondary | ICD-10-CM | POA: Diagnosis present

## 2024-02-17 DIAGNOSIS — G9341 Metabolic encephalopathy: Secondary | ICD-10-CM | POA: Diagnosis not present

## 2024-02-17 DIAGNOSIS — J441 Chronic obstructive pulmonary disease with (acute) exacerbation: Principal | ICD-10-CM

## 2024-02-17 DIAGNOSIS — J123 Human metapneumovirus pneumonia: Secondary | ICD-10-CM | POA: Diagnosis not present

## 2024-02-17 DIAGNOSIS — F05 Delirium due to known physiological condition: Secondary | ICD-10-CM | POA: Diagnosis not present

## 2024-02-17 LAB — BLOOD GAS, ARTERIAL
Acid-base deficit: 0.5 mmol/L (ref 0.0–2.0)
Bicarbonate: 24.2 mmol/L (ref 20.0–28.0)
Drawn by: 31394
FIO2: 100 %
MECHVT: 520 mL
O2 Saturation: 100 %
PEEP: 5 cmH2O
Patient temperature: 35.5
RATE: 24 {breaths}/min
pCO2 arterial: 37 mmHg (ref 32–48)
pH, Arterial: 7.42 (ref 7.35–7.45)
pO2, Arterial: 376 mmHg — ABNORMAL HIGH (ref 83–108)

## 2024-02-17 LAB — HIV ANTIBODY (ROUTINE TESTING W REFLEX): HIV Screen 4th Generation wRfx: NONREACTIVE

## 2024-02-17 LAB — SODIUM
Sodium: 115 mmol/L — CL (ref 135–145)
Sodium: 113 mmol/L — CL (ref 135–145)
Sodium: 113 mmol/L — CL (ref 135–145)

## 2024-02-17 LAB — TROPONIN I (HIGH SENSITIVITY)
Troponin I (High Sensitivity): 5 ng/L (ref <18)
Troponin I (High Sensitivity): 6 ng/L (ref <18)

## 2024-02-17 LAB — URINALYSIS, ROUTINE W REFLEX MICROSCOPIC
Bacteria, UA: NONE SEEN
Bilirubin Urine: NEGATIVE
Glucose, UA: NEGATIVE mg/dL
Hgb urine dipstick: NEGATIVE
Ketones, ur: 5 mg/dL — AB
Leukocytes,Ua: NEGATIVE
Nitrite: NEGATIVE
Protein, ur: 100 mg/dL — AB
Specific Gravity, Urine: 1.016 (ref 1.005–1.030)
pH: 6 (ref 5.0–8.0)

## 2024-02-17 LAB — CBC WITH DIFFERENTIAL/PLATELET
Abs Immature Granulocytes: 0.12 10*3/uL — ABNORMAL HIGH (ref 0.00–0.07)
Basophils Absolute: 0 10*3/uL (ref 0.0–0.1)
Basophils Relative: 0 %
Eosinophils Absolute: 0 10*3/uL (ref 0.0–0.5)
Eosinophils Relative: 0 %
HCT: 38.9 % — ABNORMAL LOW (ref 39.0–52.0)
Hemoglobin: 13.7 g/dL (ref 13.0–17.0)
Immature Granulocytes: 1 %
Lymphocytes Relative: 5 %
Lymphs Abs: 0.9 10*3/uL (ref 0.7–4.0)
MCH: 29.8 pg (ref 26.0–34.0)
MCHC: 35.2 g/dL (ref 30.0–36.0)
MCV: 84.6 fL (ref 80.0–100.0)
Monocytes Absolute: 0.7 10*3/uL (ref 0.1–1.0)
Monocytes Relative: 4 %
Neutro Abs: 16.9 10*3/uL — ABNORMAL HIGH (ref 1.7–7.7)
Neutrophils Relative %: 90 %
Platelets: 377 10*3/uL (ref 150–400)
RBC: 4.6 MIL/uL (ref 4.22–5.81)
RDW: 13.1 % (ref 11.5–15.5)
WBC: 18.7 10*3/uL — ABNORMAL HIGH (ref 4.0–10.5)
nRBC: 0 % (ref 0.0–0.2)

## 2024-02-17 LAB — HEPATIC FUNCTION PANEL
ALT: 8 U/L (ref 0–44)
AST: 19 U/L (ref 15–41)
Albumin: 3.4 g/dL — ABNORMAL LOW (ref 3.5–5.0)
Alkaline Phosphatase: 70 U/L (ref 38–126)
Bilirubin, Direct: 0.2 mg/dL (ref 0.0–0.2)
Indirect Bilirubin: 0.9 mg/dL (ref 0.3–0.9)
Total Bilirubin: 1.1 mg/dL (ref 0.0–1.2)
Total Protein: 6.9 g/dL (ref 6.5–8.1)

## 2024-02-17 LAB — I-STAT CHEM 8, ED
BUN: 7 mg/dL — ABNORMAL LOW (ref 8–23)
Calcium, Ion: 1.06 mmol/L — ABNORMAL LOW (ref 1.15–1.40)
Chloride: 74 mmol/L — ABNORMAL LOW (ref 98–111)
Creatinine, Ser: 0.5 mg/dL — ABNORMAL LOW (ref 0.61–1.24)
Glucose, Bld: 119 mg/dL — ABNORMAL HIGH (ref 70–99)
HCT: 47 % (ref 39.0–52.0)
Hemoglobin: 16 g/dL (ref 13.0–17.0)
Potassium: 5.6 mmol/L — ABNORMAL HIGH (ref 3.5–5.1)
Sodium: 112 mmol/L — CL (ref 135–145)
TCO2: 36 mmol/L — ABNORMAL HIGH (ref 22–32)

## 2024-02-17 LAB — GLUCOSE, CAPILLARY
Glucose-Capillary: 197 mg/dL — ABNORMAL HIGH (ref 70–99)
Glucose-Capillary: 135 mg/dL — ABNORMAL HIGH (ref 70–99)
Glucose-Capillary: 132 mg/dL — ABNORMAL HIGH (ref 70–99)
Glucose-Capillary: 135 mg/dL — ABNORMAL HIGH (ref 70–99)

## 2024-02-17 LAB — BASIC METABOLIC PANEL WITH GFR
Anion gap: 10 (ref 5–15)
BUN: 7 mg/dL — ABNORMAL LOW (ref 8–23)
CO2: 29 mmol/L (ref 22–32)
Calcium: 8.2 mg/dL — ABNORMAL LOW (ref 8.9–10.3)
Chloride: 73 mmol/L — ABNORMAL LOW (ref 98–111)
Creatinine, Ser: 0.37 mg/dL — ABNORMAL LOW (ref 0.61–1.24)
GFR, Estimated: >60 mL/min (ref >60)
Glucose, Bld: 115 mg/dL — ABNORMAL HIGH (ref 70–99)
Potassium: 4.7 mmol/L (ref 3.5–5.1)
Sodium: 112 mmol/L — CL (ref 135–145)

## 2024-02-17 LAB — BLOOD GAS, VENOUS
Acid-Base Excess: 5.2 mmol/L — ABNORMAL HIGH (ref 0.0–2.0)
Acid-base deficit: 4.3 mmol/L — ABNORMAL HIGH (ref 0.0–2.0)
Bicarbonate: 35.5 mmol/L — ABNORMAL HIGH (ref 20.0–28.0)
Bicarbonate: 28.9 mmol/L — ABNORMAL HIGH (ref 20.0–28.0)
O2 Saturation: 100 %
O2 Saturation: 100 %
Patient temperature: 37
Patient temperature: 37
pCO2, Ven: 81 mmHg (ref 44–60)
pCO2, Ven: 102 mmHg (ref 44–60)
pH, Ven: 7.25 (ref 7.25–7.43)
pH, Ven: 7.06 — CL (ref 7.25–7.43)
pO2, Ven: 140 mmHg — ABNORMAL HIGH (ref 32–45)
pO2, Ven: 144 mmHg — ABNORMAL HIGH (ref 32–45)

## 2024-02-17 LAB — CBC
HCT: 34.7 % — ABNORMAL LOW (ref 39.0–52.0)
Hemoglobin: 12.3 g/dL — ABNORMAL LOW (ref 13.0–17.0)
MCH: 30.2 pg (ref 26.0–34.0)
MCHC: 35.4 g/dL (ref 30.0–36.0)
MCV: 85.3 fL (ref 80.0–100.0)
Platelets: 308 10*3/uL (ref 150–400)
RBC: 4.07 MIL/uL — ABNORMAL LOW (ref 4.22–5.81)
RDW: 12.9 % (ref 11.5–15.5)
WBC: 20.5 10*3/uL — ABNORMAL HIGH (ref 4.0–10.5)
nRBC: 0 % (ref 0.0–0.2)

## 2024-02-17 LAB — LACTIC ACID, PLASMA
Lactic Acid, Venous: 5 mmol/L (ref 0.5–1.9)
Lactic Acid, Venous: 4.1 mmol/L (ref 0.5–1.9)
Lactic Acid, Venous: 2.4 mmol/L (ref 0.5–1.9)

## 2024-02-17 LAB — MAGNESIUM: Magnesium: 1.5 mg/dL — ABNORMAL LOW (ref 1.7–2.4)

## 2024-02-17 LAB — RESP PANEL BY RT-PCR (RSV, FLU A&B, COVID)  RVPGX2
Influenza A by PCR: NEGATIVE
Influenza B by PCR: NEGATIVE
Resp Syncytial Virus by PCR: NEGATIVE
SARS Coronavirus 2 by RT PCR: NEGATIVE

## 2024-02-17 LAB — MRSA NEXT GEN BY PCR, NASAL: MRSA by PCR Next Gen: NOT DETECTED

## 2024-02-17 LAB — HEMOGLOBIN A1C
Hgb A1c MFr Bld: 5.4 % (ref 4.8–5.6)
Mean Plasma Glucose: 108.28 mg/dL

## 2024-02-17 LAB — OSMOLALITY: Osmolality: 237 mosm/kg — CL (ref 275–295)

## 2024-02-17 LAB — CREATININE, SERUM
Creatinine, Ser: 0.55 mg/dL — ABNORMAL LOW (ref 0.61–1.24)
GFR, Estimated: >60 mL/min (ref >60)

## 2024-02-17 LAB — I-STAT CG4 LACTIC ACID, ED: Lactic Acid, Venous: 0.4 mmol/L — ABNORMAL LOW (ref 0.5–1.9)

## 2024-02-17 LAB — CORTISOL: Cortisol, Plasma: 20 ug/dL

## 2024-02-17 LAB — PHOSPHORUS: Phosphorus: <1 mg/dL — CL (ref 2.5–4.6)

## 2024-02-17 LAB — BRAIN NATRIURETIC PEPTIDE: B Natriuretic Peptide: 250.6 pg/mL — ABNORMAL HIGH (ref 0.0–100.0)

## 2024-02-17 LAB — OSMOLALITY, URINE: Osmolality, Ur: 451 mosm/kg (ref 300–900)

## 2024-02-17 LAB — SODIUM, URINE, RANDOM: Sodium, Ur: 77 mmol/L

## 2024-02-17 LAB — CBG MONITORING, ED: Glucose-Capillary: 126 mg/dL — ABNORMAL HIGH (ref 70–99)

## 2024-02-17 MED ORDER — POLYETHYLENE GLYCOL 3350 17 G PO PACK: 17.0000 g | PACK | Freq: Every day | ORAL | Status: DC | PRN

## 2024-02-17 MED ORDER — CHLORHEXIDINE GLUCONATE CLOTH 2 % EX PADS
6.0000 | MEDICATED_PAD | Freq: Every day | CUTANEOUS | Status: DC
  Administered 2024-02-17: 6 via TOPICAL

## 2024-02-17 MED ORDER — FENTANYL CITRATE PF 50 MCG/ML IJ SOSY
25.0000 ug | PREFILLED_SYRINGE | INTRAMUSCULAR | Status: DC | PRN
  Administered 2024-02-17 (×2): 100 ug via INTRAVENOUS
  Administered 2024-02-17: 50 ug via INTRAVENOUS
  Administered 2024-02-17 – 2024-02-18 (×5): 100 ug via INTRAVENOUS
  Filled 2024-02-17 (×6): qty 2
  Filled 2024-02-17: qty 1
  Filled 2024-02-17: qty 2

## 2024-02-17 MED ORDER — IPRATROPIUM-ALBUTEROL 0.5-2.5 (3) MG/3ML IN SOLN: 3.0000 mL | RESPIRATORY_TRACT | Status: DC | PRN

## 2024-02-17 MED ORDER — DOCUSATE SODIUM 100 MG PO CAPS: 100.0000 mg | ORAL_CAPSULE | Freq: Two times a day (BID) | ORAL | Status: DC | PRN

## 2024-02-17 MED ORDER — METHYLPREDNISOLONE SODIUM SUCC 125 MG IJ SOLR
125.0000 mg | Freq: Once | INTRAMUSCULAR | Status: AC
  Administered 2024-02-17: 125 mg via INTRAVENOUS
  Filled 2024-02-17: qty 2

## 2024-02-17 MED ORDER — SODIUM CHLORIDE 0.9 % IV SOLN
1.0000 g | Freq: Once | INTRAVENOUS | Status: AC
  Administered 2024-02-17: 1 g via INTRAVENOUS
  Filled 2024-02-17: qty 10

## 2024-02-17 MED ORDER — PROPOFOL 500 MG/50ML IV EMUL
INTRAVENOUS | Status: AC
  Administered 2024-02-17: 5 ug/kg/min via INTRAVENOUS
  Filled 2024-02-17: qty 50

## 2024-02-17 MED ORDER — PANTOPRAZOLE SODIUM 40 MG IV SOLR
40.0000 mg | Freq: Every day | INTRAVENOUS | Status: DC
Start: 2024-02-17 — End: 2024-02-20
  Administered 2024-02-17 – 2024-02-20 (×4): 40 mg via INTRAVENOUS
  Filled 2024-02-17 (×4): qty 10

## 2024-02-17 MED ORDER — REVEFENACIN 175 MCG/3ML IN SOLN
175.0000 ug | Freq: Every day | RESPIRATORY_TRACT | Status: DC
  Administered 2024-02-17 – 2024-02-23 (×7): 175 ug via RESPIRATORY_TRACT
  Filled 2024-02-17 (×7): qty 3

## 2024-02-17 MED ORDER — ORAL CARE MOUTH RINSE: 15.0000 mL | OROMUCOSAL | Status: DC | PRN

## 2024-02-17 MED ORDER — IPRATROPIUM BROMIDE 0.02 % IN SOLN
0.5000 mg | Freq: Once | RESPIRATORY_TRACT | Status: AC
  Administered 2024-02-17: 0.5 mg via RESPIRATORY_TRACT
  Filled 2024-02-17: qty 2.5

## 2024-02-17 MED ORDER — FENTANYL CITRATE PF 50 MCG/ML IJ SOSY
25.0000 ug | PREFILLED_SYRINGE | INTRAMUSCULAR | Status: DC | PRN
  Administered 2024-02-17: 25 ug via INTRAVENOUS
  Filled 2024-02-17: qty 1

## 2024-02-17 MED ORDER — ORAL CARE MOUTH RINSE
15.0000 mL | OROMUCOSAL | Status: DC
  Administered 2024-02-17 – 2024-02-18 (×14): 15 mL via OROMUCOSAL

## 2024-02-17 MED ORDER — ETOMIDATE 2 MG/ML IV SOLN
INTRAVENOUS | Status: AC
  Administered 2024-02-17: 20 mg
  Filled 2024-02-17: qty 10

## 2024-02-17 MED ORDER — FENTANYL 2500MCG IN NS 250ML (10MCG/ML) PREMIX INFUSION: 0.0000 ug/h | INTRAVENOUS | Status: DC

## 2024-02-17 MED ORDER — ETOMIDATE 2 MG/ML IV SOLN: 20.0000 mg | Freq: Once | INTRAVENOUS | Status: DC

## 2024-02-17 MED ORDER — ROCURONIUM BROMIDE 10 MG/ML (PF) SYRINGE: 100.0000 mg | PREFILLED_SYRINGE | Freq: Once | INTRAVENOUS | Status: DC

## 2024-02-17 MED ORDER — SODIUM CHLORIDE 0.9 % IV BOLUS: 1000.0000 mL | Freq: Once | INTRAVENOUS | Status: DC

## 2024-02-17 MED ORDER — HEPARIN SODIUM (PORCINE) 5000 UNIT/ML IJ SOLN
5000.0000 [IU] | Freq: Three times a day (TID) | INTRAMUSCULAR | Status: DC
  Administered 2024-02-17 – 2024-02-23 (×17): 5000 [IU] via SUBCUTANEOUS
  Filled 2024-02-17 (×17): qty 1

## 2024-02-17 MED ORDER — PROSOURCE TF20 ENFIT COMPATIBL EN LIQD
60.0000 mL | Freq: Every day | ENTERAL | Status: DC
  Administered 2024-02-17: 60 mL
  Filled 2024-02-17: qty 60

## 2024-02-17 MED ORDER — FAMOTIDINE 20 MG PO TABS
20.0000 mg | ORAL_TABLET | Freq: Two times a day (BID) | ORAL | Status: DC
Start: 2024-02-17 — End: 2024-02-17

## 2024-02-17 MED ORDER — SCOPOLAMINE 1 MG/3DAYS TD PT72
1.0000 | MEDICATED_PATCH | Freq: Once | TRANSDERMAL | Status: DC
  Administered 2024-02-17: 1.5 mg via TRANSDERMAL
  Filled 2024-02-17: qty 1

## 2024-02-17 MED ORDER — ONDANSETRON HCL 4 MG/2ML IJ SOLN
4.0000 mg | Freq: Once | INTRAMUSCULAR | Status: AC
  Administered 2024-02-17: 4 mg via INTRAVENOUS
  Filled 2024-02-17: qty 2

## 2024-02-17 MED ORDER — BUDESONIDE 0.5 MG/2ML IN SUSP
0.5000 mg | Freq: Two times a day (BID) | RESPIRATORY_TRACT | Status: DC
  Administered 2024-02-17 – 2024-02-23 (×13): 0.5 mg via RESPIRATORY_TRACT
  Filled 2024-02-17 (×13): qty 2

## 2024-02-17 MED ORDER — METHYLPREDNISOLONE SODIUM SUCC 40 MG IJ SOLR
40.0000 mg | Freq: Two times a day (BID) | INTRAMUSCULAR | Status: DC
  Administered 2024-02-17 – 2024-02-18 (×3): 40 mg via INTRAVENOUS
  Filled 2024-02-17 (×3): qty 1

## 2024-02-17 MED ORDER — VITAL 1.5 CAL PO LIQD
1000.0000 mL | ORAL | Status: DC
  Administered 2024-02-17 – 2024-02-18 (×2): 1000 mL
  Filled 2024-02-17 (×4): qty 1000

## 2024-02-17 MED ORDER — ROCURONIUM BROMIDE 10 MG/ML (PF) SYRINGE
PREFILLED_SYRINGE | INTRAVENOUS | Status: AC
  Administered 2024-02-17: 100 mg
  Filled 2024-02-17: qty 10

## 2024-02-17 MED ORDER — MAGNESIUM SULFATE 2 GM/50ML IV SOLN
2.0000 g | Freq: Once | INTRAVENOUS | Status: AC
  Administered 2024-02-17: 2 g via INTRAVENOUS
  Filled 2024-02-17: qty 50

## 2024-02-17 MED ORDER — ALBUTEROL SULFATE (2.5 MG/3ML) 0.083% IN NEBU
15.0000 mg/h | INHALATION_SOLUTION | Freq: Once | RESPIRATORY_TRACT | Status: AC
  Administered 2024-02-17: 15 mg/h via RESPIRATORY_TRACT
  Filled 2024-02-17: qty 3

## 2024-02-17 MED ORDER — SODIUM PHOSPHATES 45 MMOLE/15ML IV SOLN
30.0000 mmol | Freq: Once | INTRAVENOUS | Status: AC
  Administered 2024-02-17: 30 mmol via INTRAVENOUS
  Filled 2024-02-17: qty 10

## 2024-02-17 MED ORDER — SODIUM CHLORIDE 0.9 % IV SOLN
1.0000 g | INTRAVENOUS | Status: AC
  Administered 2024-02-18 – 2024-02-21 (×4): 1 g via INTRAVENOUS
  Filled 2024-02-17 (×4): qty 10

## 2024-02-17 MED ORDER — SODIUM CHLORIDE 0.9 % IV SOLN: INTRAVENOUS | Status: DC

## 2024-02-17 MED ORDER — SODIUM CHLORIDE 0.9 % IV SOLN
500.0000 mg | INTRAVENOUS | Status: AC
  Administered 2024-02-17 – 2024-02-21 (×5): 500 mg via INTRAVENOUS
  Filled 2024-02-17 (×5): qty 5

## 2024-02-17 MED ORDER — POLYETHYLENE GLYCOL 3350 17 G PO PACK
17.0000 g | PACK | Freq: Every day | ORAL | Status: DC
  Administered 2024-02-17: 17 g
  Filled 2024-02-17: qty 1

## 2024-02-17 MED ORDER — ORAL CARE MOUTH RINSE
15.0000 mL | OROMUCOSAL | Status: DC
Start: 2024-02-17 — End: 2024-02-17

## 2024-02-17 MED ORDER — ARFORMOTEROL TARTRATE 15 MCG/2ML IN NEBU
15.0000 ug | INHALATION_SOLUTION | Freq: Two times a day (BID) | RESPIRATORY_TRACT | Status: DC
  Administered 2024-02-17 – 2024-02-23 (×12): 15 ug via RESPIRATORY_TRACT
  Filled 2024-02-17 (×12): qty 2

## 2024-02-17 MED ORDER — PROPOFOL 1000 MG/100ML IV EMUL
0.0000 ug/kg/min | INTRAVENOUS | Status: DC
  Administered 2024-02-17: 40 ug/kg/min via INTRAVENOUS
  Administered 2024-02-17 (×2): 50 ug/kg/min via INTRAVENOUS
  Administered 2024-02-18: 60 ug/kg/min via INTRAVENOUS
  Administered 2024-02-18: 70 ug/kg/min via INTRAVENOUS
  Filled 2024-02-17 (×5): qty 100

## 2024-02-17 MED ORDER — DOCUSATE SODIUM 50 MG/5ML PO LIQD
100.0000 mg | Freq: Two times a day (BID) | ORAL | Status: DC
  Administered 2024-02-17 (×2): 100 mg
  Filled 2024-02-17 (×2): qty 10

## 2024-02-17 MED ORDER — ALBUTEROL SULFATE (2.5 MG/3ML) 0.083% IN NEBU
10.0000 mg/h | INHALATION_SOLUTION | Freq: Once | RESPIRATORY_TRACT | Status: AC
  Administered 2024-02-17: 10 mg/h via RESPIRATORY_TRACT
  Filled 2024-02-17: qty 12

## 2024-02-17 MED ORDER — INSULIN ASPART 100 UNIT/ML IJ SOLN
0.0000 [IU] | INTRAMUSCULAR | Status: DC
  Administered 2024-02-17: 2 [IU] via SUBCUTANEOUS
  Administered 2024-02-17 – 2024-02-19 (×7): 1 [IU] via SUBCUTANEOUS

## 2024-02-17 MED ORDER — FENTANYL BOLUS VIA INFUSION: 25.0000 ug | INTRAVENOUS | Status: DC | PRN

## 2024-02-17 NOTE — Progress Notes (Signed)
 Notified of phos <1, Magnesium  1.5 Placed orders for sodium phosphate  IV, magnesium  sulfate 2g IV Will continue to monitor labs and give additional repletion as warranted.

## 2024-02-17 NOTE — ED Notes (Signed)
 Family at bedside and provided with update.

## 2024-02-17 NOTE — ED Notes (Signed)
Urine sent down. 

## 2024-02-17 NOTE — ED Notes (Signed)
 Critical care at bedside

## 2024-02-17 NOTE — H&P (Signed)
 NAME:  Ronald  [name], MRN:  [id number], DOB:  05-28-1956, LOS: 0 ADMISSION DATE:  02/17/2024, CONSULTATION DATE:  02/17/24 REFERRING MD:  Linder Revere - EM, CHIEF COMPLAINT:  AMS    History of Present Illness:  68 yo M PMH emphysema on advair /albuterol , Chronic hypoxia on 2L, tobacco use, R apical lung nodule (follows w [facility]), chronic hypoNa (baseline 130, was admitted 12/24 to [facility] w Na 107) who presented to [facility] ED 6/6 from his facility [facility] w CC SOB and lethargy. In ED initially breathing felt somewhat improved but subsequently declined w SOB, wheezing, poor air movement + progressive confusion/lethargy. Labs significant for Na 112 and Abg after his resp/mental status worsened was 7.06/102/144. WBC 19,CXR w bibasilar ASD   EM has sent urine Na urine osm  -- still pending   He was started on BiPAP   PCCM was consulted in this setting  He acutely declined on BiPAP and required intubation in the ED shortly before PCCM could evaluate the pt    Chart reviewed, looks like on imaging June 2024 to Jan 2025, RUL nodule was felt unchanged and likely benign scar as PET was neg.  In review of prior admission for hypoNa- felt r/t incr water intake, etoh intake    Pertinent  Medical History  COPD Tobacco use Lung nodule S/p BLE amputations   Significant Hospital Events: Including procedures, antibiotic start and stop dates in addition to other pertinent events     Interim History / Subjective:  Intubated recently   Objective    Blood pressure 139/76, pulse (!) 117, temperature (!) 95.9 F (35.5 C), temperature source Core, resp. rate 20, height 5\' 7"  (1.702 m), weight 70 kg, SpO2 100%.    Vent Mode: PRVC FiO2 (%):  [50 %-100 %] 100 % Set Rate:  [24 bmp] 24 bmp Vt Set:  [520 mL] 520 mL PEEP:  [5 cmH20-6 cmH20] 5 cmH20 Pressure Support:  [14 cmH20] 14 cmH20 Plateau Pressure:  [17 cmH20] 17 cmH20   Intake/Output Summary (Last 24 hours) at 02/17/2024 1038 Last data filed at 02/17/2024  4098 Gross per 24 hour  Intake 350 ml  Output --  Net 350 ml   Filed Weights   02/17/24 0815  Weight: 70 kg    Examination: General: Chronically and critically ill M, appears older than stated age  HENT: ETT secure anicteric sclera   Lungs: Some L upper crackles, otherwise pretty clear. Mechanically ventilated.  Cardiovascular: rr s1s2 cap refill is brisk and rad pulses are 2+  Abdomen: round protuberant soft  Extremities: no acute appearing joint deformity. BLE BKAs  Neuro: sedated. Pupils are 2mm  GU: defer   Resolved problem list   Assessment and Plan   Acute encephalopathy, multifactorial -hypercarbia, hyponatremia -think CO2 might be bigger driver of the two based on timeline in ED P -gas post intubation -na as below -prop gtt + PRN fent for PAD -- goal RASS 0/-1   Acute on chronic respiratory failure with hypoxemia and hypercarbia  AECOPD RUL lung nodule  Hx tobacco use  -nodule has been stable outpt 6/26-1/25 w plan for repeat in 50mo. Felt benign/scarring.  -flu covid rsv neg  P -CT chest -- if c/f malignancy then would need to figure out bronch, consider neuro imaging etc  -MV support -CAP coverage  -steroids -triple therapy  -RVP -trach asp   AoC hyponatremia -baseline 130s. Prior hospitalization late 2024 for hypoNa felt related to low solute intake. ADH normal, ACTH low but was  on steroids for AECOPD   -we dont have a great hx acutely, could be more of the same (high water intake) but can't rule out other process like Ca (known lung nodule) contributing P -repeat Na - -ensure validity then serial Na  -urine Na urine osm pending  -might need HTS -- for now just start w NS 50/hr while we are waiting for more information.  -Noted in prior admissions that he has quickly overcorrected w HTS in the past.  -send cortisol though has rcvd solumedrol so timing of lab might be skewed   HTN -seeing how we level out of sedation gtts before we add meds     Best Practice (right click and "Reselect all SmartList Selections" daily)   Diet/type: NPO -- will order EN per RDN  DVT prophylaxis prophylactic heparin   Pressure ulcer(s): pressure ulcer assessment deferred  GI prophylaxis: PPI Lines: N/A Foley:  N/A Code Status:  full code Last date of multidisciplinary goals of care discussion [--]  Labs   CBC: Recent Labs  Lab 02/17/24 0737 02/17/24 0747  WBC 18.7*  --   NEUTROABS 16.9*  --   HGB 13.7 16.0  HCT 38.9* 47.0  MCV 84.6  --   PLT 377  --     Basic Metabolic Panel: Recent Labs  Lab 02/17/24 0737 02/17/24 0747  NA 112* 112*  K 4.7 5.6*  CL 73* 74*  CO2 29  --   GLUCOSE 115* 119*  BUN 7* 7*  CREATININE 0.37* 0.50*  CALCIUM  8.2*  --    GFR: Estimated Creatinine Clearance: 84.9 mL/min (A) (by C-G formula based on SCr of 0.5 mg/dL (L)). Recent Labs  Lab 02/17/24 0737 02/17/24 0748  WBC 18.7*  --   LATICACIDVEN  --  0.4*    Liver Function Tests: No results for input(s): "AST", "ALT", "ALKPHOS", "BILITOT", "PROT", "ALBUMIN" in the last 168 hours. No results for input(s): "LIPASE", "AMYLASE" in the last 168 hours. No results for input(s): "AMMONIA" in the last 168 hours.  ABG    Component Value Date/Time   PHART 7.253 (L) 10/15/2022 1148   PCO2ART 62.4 (H) 10/15/2022 1148   PO2ART 76 (L) 10/15/2022 1148   HCO3 28.9 (H) 02/17/2024 0857   TCO2 36 (H) 02/17/2024 0747   ACIDBASEDEF 4.3 (H) 02/17/2024 0857   O2SAT 100 02/17/2024 0857     Coagulation Profile: No results for input(s): "INR", "PROTIME" in the last 168 hours.  Cardiac Enzymes: No results for input(s): "CKTOTAL", "CKMB", "CKMBINDEX", "TROPONINI" in the last 168 hours.  HbA1C: Hgb A1c MFr Bld  Date/Time Value Ref Range Status  09/02/2022 09:03 PM 5.6 4.8 - 5.6 % Final    Comment:    (NOTE)         Prediabetes: 5.7 - 6.4         Diabetes: >6.4         Glycemic control for adults with diabetes: <7.0   11/09/2013 01:00 PM 5.9 (H) <5.7  % Final    Comment:    (NOTE)                                                                       According to the ADA Clinical Practice Recommendations for  2011, when HbA1c is used as a screening test:  >=6.5%   Diagnostic of Diabetes Mellitus           (if abnormal result is confirmed) 5.7-6.4%   Increased risk of developing Diabetes Mellitus References:Diagnosis and Classification of Diabetes Mellitus,Diabetes Care,2011,34(Suppl 1):S62-S69 and Standards of Medical Care in         Diabetes - 2011,Diabetes Care,2011,34 (Suppl 1):S11-S61.    CBG: Recent Labs  Lab 02/17/24 0747  GLUCAP 126*    Review of Systems:   Unable to obtain intubated sedated   Past Medical History:  He,  has a past medical history of Critical lower limb ischemia (HCC), ETOH abuse, Gout, and Hypertension.   Surgical History:   Past Surgical History:  Procedure Laterality Date   ABDOMINAL AORTAGRAM N/A 12/26/2013   Procedure: ABDOMINAL AORTAGRAM;  Surgeon: Margherita Shell, MD;  Location: [facility] CATH LAB;  Service: Cardiovascular;  Laterality: N/A;   AMPUTATION Left 12/28/2013   Procedure: LEFT BELOW KNEE AMPUTATION;  Surgeon: Timothy Ford, MD;  Location: [facility] OR;  Service: Orthopedics;  Laterality: Left;   AMPUTATION Right 11/17/2022   Procedure: RIGHT BELOW KNEE AMPUTATION;  Surgeon: Timothy Ford, MD;  Location[facility];  Service: Orthopedics;  Laterality: Right;   BELOW KNEE LEG AMPUTATION     Left   LEG SURGERY       Social History:   reports that he has been smoking cigarettes. He has a 20 pack-year smoking history. He has never used smokeless tobacco. He reports current alcohol  use of about 120.0 standard drinks of alcohol  per week. He reports current drug use. Drugs: Cocaine  and Marijuana.   Family History:  His family history includes Diabetes Mellitus II in his mother. There is no history of CAD or Stroke.   Allergies No Known Allergies   Home Medications  Prior to Admission medications    Medication Sig Start Date End Date Taking? Authorizing Provider  albuterol  (VENTOLIN  HFA) 108 (90 Base) MCG/ACT inhaler Inhale 2 puffs into the lungs every 6 (six) hours as needed for wheezing or shortness of breath. 09/17/22   Wynetta Heckle, MD  docusate sodium  (COLACE) 100 MG capsule Take 1 capsule (100 mg total) by mouth daily. 12/04/22   Macdonald Savoy, MD  fluticasone  furoate-vilanterol (BREO ELLIPTA ) 100-25 MCG/ACT AEPB Inhale 1 puff into the lungs daily. Patient not taking: Reported on 10/18/2022 09/17/22   Wynetta Heckle, MD  metoprolol  tartrate (LOPRESSOR ) 50 MG tablet Take 1 tablet (50 mg total) by mouth 2 (two) times daily. 12/03/22   Macdonald Savoy, MD  Multiple Vitamin (MULTIVITAMIN WITH MINERALS) TABS tablet Take 1 tablet by mouth daily. Patient not taking: Reported on 10/18/2022 09/29/22   Akula, Vijaya, MD  pantoprazole  (PROTONIX ) 40 MG tablet Take 1 tablet (40 mg total) by mouth daily. Patient not taking: Reported on 10/18/2022 09/29/22   Akula, Vijaya, MD  thiamine  (VITAMIN B1) 100 MG tablet Take 1 tablet (100 mg total) by mouth daily. Patient not taking: Reported on 10/18/2022 09/17/22   Wynetta Heckle, MD  ADVAIR  HFA 230-21 MCG/ACT inhaler Inhale 2 puffs into the lungs 2 (two) times daily. 04/16/22 09/17/22  Leona Rake, MD     Critical care time: 46 min       CRITICAL CARE Performed by: Delories Fetter   Total critical care time: 46 minutes  Critical care time was exclusive of separately billable procedures and treating other patients. Critical care was necessary to treat or  prevent imminent or life-threatening deterioration.  Critical care was time spent personally by me on the following activities: development of treatment plan with patient and/or surrogate as well as nursing, discussions with consultants, evaluation of patient's response to treatment, examination of patient, obtaining history from patient or surrogate, ordering and performing treatments and  interventions, ordering and review of laboratory studies, ordering and review of radiographic studies, pulse oximetry and re-evaluation of patient's condition.  Eston Hence MSN, AGACNP-BC [facility] Amion for pager  02/17/2024, 10:38 AM

## 2024-02-17 NOTE — Procedures (Signed)
 Adjusted ETT 2 cm per MD.25 cm at the lips.

## 2024-02-17 NOTE — IPAL (Signed)
  Interdisciplinary Goals of Care Family Meeting   Date carried out: 02/17/2024  Location of the meeting: Bedside  Member's involved: Nurse Practitioner, Bedside Registered Nurse, and Family Member or next of kin  Durable Power of Attorney or acting medical decision maker: sisters     Discussion: We discussed goals of care for [employer] .  Updated on clinical status, plan of care. All questions answered   Code status:   Code Status: Full Code   Disposition: Continue current acute care  Time spent for the meeting: 10 min     Delories Fetter, NP  02/17/2024, 11:10 AM

## 2024-02-17 NOTE — ED Provider Notes (Signed)
 [facility] AT [facility] Provider Note   CSN: [id number] Arrival date & time: 02/17/24  4098     History  Chief Complaint  Patient presents with   Shortness of Breath    Ronald [name] is a 68 y.o. male.  This is a 68 year old male coming from [facility] complaining of shortness of breath x 2 days.  Reportedly hypoxic with EMS 82% on room air and the staff noted that he was more sleepy today.  Patient is a poor historian, but is able to tell me his name and where he is.  Reports breathing somewhat improved.  Denies chest pain.   Shortness of Breath      Home Medications Prior to Admission medications   Medication Sig Start Date End Date Taking? Authorizing Provider  acetaminophen  (TYLENOL ) 325 MG tablet Take 650 mg by mouth every 6 (six) hours as needed for mild pain (pain score 1-3). Do not exceed 3000mg  in 24 hours. (Note order for scheduled 1000mg  BID)   Yes [provider]  acetaminophen  (TYLENOL ) 500 MG tablet Take 1,000 mg by mouth in the morning and at bedtime.   Yes [provider]  amLODipine  (NORVASC ) 10 MG tablet Take 10 mg by mouth daily.   Yes [provider]  busPIRone (BUSPAR) 5 MG tablet Take 5 mg by mouth at bedtime.   Yes [provider]  docusate sodium  (COLACE) 100 MG capsule Take 100 mg by mouth daily.   Yes [provider]  ergocalciferol, VITAMIN D2, (DRISDOL) 200 MCG/ML drops Take 2,000 Units by mouth daily. 0.41ml = 2000 units = 50 mcg   Yes [provider]  fluticasone -salmeterol (ADVAIR ) 250-50 MCG/ACT AEPB Inhale 1 puff into the lungs in the morning and at bedtime.   Yes [provider]  guaiFENesin -dextromethorphan  (ROBITUSSIN DM) 100-10 MG/5ML syrup Take 10 mLs by mouth every 6 (six) hours as needed for cough.   Yes [provider]  ipratropium-albuterol  (DUONEB) 0.5-2.5 (3) MG/3ML SOLN Take 3 mLs by nebulization in the morning and at bedtime.    Yes [provider]  ipratropium-albuterol  (DUONEB) 0.5-2.5 (3) MG/3ML SOLN Take 3 mLs by nebulization every 6 (six) hours as needed (shortness of breath or wheezing).   Yes [provider]  melatonin 3 MG TABS tablet Take 3 mg by mouth at bedtime.   Yes [provider]  metoprolol  tartrate (LOPRESSOR ) 50 MG tablet Take 1 tablet (50 mg total) by mouth 2 (two) times daily. 12/03/22  Yes Macdonald Savoy, MD  pantoprazole  (PROTONIX ) 40 MG tablet Take 1 tablet (40 mg total) by mouth daily. 09/29/22  Yes Akula, Vijaya, MD  thiamine  (VITAMIN B1) 100 MG tablet Take 1 tablet (100 mg total) by mouth daily. 09/17/22  Yes Wynetta Heckle, MD  cetirizine (ZYRTEC) 10 MG tablet Take 10 mg by mouth daily. Patient not taking: Reported on 02/17/2024    [provider]  fluticasone  (FLONASE) 50 MCG/ACT nasal spray Place 1 spray into both nostrils daily. Patient not taking: Reported on 02/17/2024    [provider]      Allergies    Patient has no known allergies.    Review of Systems   Review of Systems  Respiratory:  Positive for shortness of breath.     Physical Exam Updated Vital Signs BP (!) 91/49   Pulse 89   Temp (!) 100.4 F (38 C)   Resp (!) 24   Ht 5\' 7"  (1.702 m)  Wt 70 kg   SpO2 100%   BMI 24.17 kg/m  Physical Exam Vitals and nursing note reviewed.  Constitutional:      Appearance: He is obese. He is ill-appearing.  HENT:     Head: Normocephalic.  Cardiovascular:     Rate and Rhythm: Normal rate and regular rhythm.  Pulmonary:     Effort: Accessory muscle usage and respiratory distress present.     Breath sounds: Decreased breath sounds and wheezing present.  Musculoskeletal:     Comments: Bilateral BKA  Skin:    General: Skin is warm and dry.     Capillary Refill: Capillary refill takes less than 2 seconds.  Neurological:     Mental Status: He is alert.     Comments: No gross motor deficits     ED Results / Procedures /  Treatments   Labs (all labs ordered are listed, but only abnormal results are displayed) Labs Reviewed  BASIC METABOLIC PANEL WITH GFR - Abnormal; Notable for the following components:      Result Value   Sodium 112 (*)    Chloride 73 (*)    Glucose, Bld 115 (*)    BUN 7 (*)    Creatinine, Ser 0.37 (*)    Calcium  8.2 (*)    All other components within normal limits  CBC WITH DIFFERENTIAL/PLATELET - Abnormal; Notable for the following components:   WBC 18.7 (*)    HCT 38.9 (*)    Neutro Abs 16.9 (*)    Abs Immature Granulocytes 0.12 (*)    All other components within normal limits  BLOOD GAS, VENOUS - Abnormal; Notable for the following components:   pCO2, Ven 81 (*)    pO2, Ven 140 (*)    Bicarbonate 35.5 (*)    Acid-Base Excess 5.2 (*)    All other components within normal limits  BLOOD GAS, VENOUS - Abnormal; Notable for the following components:   pH, Ven 7.06 (*)    pCO2, Ven 102 (*)    pO2, Ven 144 (*)    Bicarbonate 28.9 (*)    Acid-base deficit 4.3 (*)    All other components within normal limits  BLOOD GAS, ARTERIAL - Abnormal; Notable for the following components:   pO2, Arterial 376 (*)    All other components within normal limits  CBC - Abnormal; Notable for the following components:   WBC 20.5 (*)    RBC 4.07 (*)    Hemoglobin 12.3 (*)    HCT 34.7 (*)    All other components within normal limits  CREATININE, SERUM - Abnormal; Notable for the following components:   Creatinine, Ser 0.55 (*)    All other components within normal limits  BRAIN NATRIURETIC PEPTIDE - Abnormal; Notable for the following components:   B Natriuretic Peptide 250.6 (*)    All other components within normal limits  LACTIC ACID, PLASMA - Abnormal; Notable for the following components:   Lactic Acid, Venous 5.0 (*)    All other components within normal limits  URINALYSIS, ROUTINE W REFLEX MICROSCOPIC - Abnormal; Notable for the following components:   Ketones, ur 5 (*)    Protein,  ur 100 (*)    All other components within normal limits  SODIUM - Abnormal; Notable for the following components:   Sodium 115 (*)    All other components within normal limits  GLUCOSE, CAPILLARY - Abnormal; Notable for the following components:   Glucose-Capillary 197 (*)    All other components within  normal limits  LACTIC ACID, PLASMA - Abnormal; Notable for the following components:   Lactic Acid, Venous 4.1 (*)    All other components within normal limits  HEPATIC FUNCTION PANEL - Abnormal; Notable for the following components:   Albumin 3.4 (*)    All other components within normal limits  SODIUM - Abnormal; Notable for the following components:   Sodium 113 (*)    All other components within normal limits  CBG MONITORING, ED - Abnormal; Notable for the following components:   Glucose-Capillary 126 (*)    All other components within normal limits  I-STAT CHEM 8, ED - Abnormal; Notable for the following components:   Sodium 112 (*)    Potassium 5.6 (*)    Chloride 74 (*)    BUN 7 (*)    Creatinine, Ser 0.50 (*)    Glucose, Bld 119 (*)    Calcium , Ion 1.06 (*)    TCO2 36 (*)    All other components within normal limits  I-STAT CG4 LACTIC ACID, ED - Abnormal; Notable for the following components:   Lactic Acid, Venous 0.4 (*)    All other components within normal limits  RESP PANEL BY RT-PCR (RSV, FLU A&B, COVID)  RVPGX2  MRSA NEXT GEN BY PCR, NASAL  CULTURE, BLOOD (ROUTINE X 2)  CULTURE, BLOOD (ROUTINE X 2)  RESPIRATORY PANEL BY PCR  CULTURE, RESPIRATORY W GRAM STAIN  MRSA NEXT GEN BY PCR, NASAL  OSMOLALITY, URINE  SODIUM, URINE, RANDOM  HEMOGLOBIN A1C  HIV ANTIBODY (ROUTINE TESTING W REFLEX)  CORTISOL  LACTIC ACID, PLASMA  MAGNESIUM   PHOSPHORUS  SODIUM  SODIUM  OSMOLALITY  TROPONIN I (HIGH SENSITIVITY)  TROPONIN I (HIGH SENSITIVITY)    EKG EKG Interpretation Date/Time:  Friday February 17 2024 07:35:37 EDT Ventricular Rate:  89 PR Interval:  204 QRS  Duration:  135 QT Interval:  378 QTC Calculation: 460 R Axis:   -80  Text Interpretation: Sinus rhythm 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 RADIATION DOSE REDUCTION: This exam was performed according to the departmental dose-optimization program which includes automated exposure control, adjustment of the mA and/or kV according to patient size and/or use of iterative reconstruction technique. COMPARISON:  09/02/2022 FINDINGS: Cardiovascular: Aortic atherosclerosis. Normal heart size. Left coronary artery calcifications. Enlargement of the main pulmonary artery measuring up to 4.3 cm in caliber. No pericardial effusion. Mediastinum/Nodes: No enlarged mediastinal, hilar, or axillary lymph nodes. Thyroid  gland, trachea, and esophagus demonstrate no significant findings. Lungs/Pleura: Endotracheal intubation, tube tip just above the carina near the right mainstem bronchial ostium mild centrilobular emphysema. Diffuse bilateral bronchial wall thickening. Dependent bibasilar atelectasis or consolidation. No pleural effusion or pneumothorax. Upper Abdomen: No acute abnormality.  Tiny gallstone. Musculoskeletal: No chest wall abnormality. No acute osseous findings. IMPRESSION: 1. Endotracheal intubation, tube tip just above the carina near the right mainstem bronchial ostium. Consider slight retraction. 2. Dependent bibasilar atelectasis or consolidation. 3. Emphysema and diffuse bilateral bronchial wall thickening. 4. Enlargement of the main pulmonary artery, as can be seen in pulmonary hypertension. 5. Coronary artery disease. 6. Cholelithiasis. Aortic Atherosclerosis  (ICD10-I70.0) and Emphysema (ICD10-J43.9). Electronically Signed   By: Fredricka Jenny M.D.   On: 02/17/2024  10:29   DG Abd Portable 1V Result Date: 02/17/2024 CLINICAL DATA:  Post intubation EXAM: PORTABLE ABDOMEN - 1 VIEW COMPARISON:  None Available. FINDINGS: Comparison with prior examination there is no change in the position of the endotracheal tube the tip of which remains 1 cm from carina. The nasogastric tube remains in anatomic position the tip in the stomach. Prominence could correlate with IMPRESSION: *Endotracheal tube tip remains 1 cm from carina. *Nasogastric tube tip in the stomach. Electronically Signed   By: Fredrich Jefferson M.D.   On: 02/17/2024 09:47   DG Chest Portable 1 View Result Date: 02/17/2024 CLINICAL DATA:  Post intubation EXAM: PORTABLE CHEST 1 VIEW COMPARISON:  February 17, 2024, 7:49 a.m. FINDINGS: Comparison with prior examination there is no change in the position of the endotracheal tube the tip of which remains 2 cm from carina. The nasogastric tube remains in anatomic position the tip in the stomach. Minimal basilar hypoventilatory atelectasis without acute infiltrates consolidations or significant pulmonary edema. Obscuration of the left hemidiaphragm may correlate with subtle pleural effusion no hypoventilatory atelectasis IMPRESSION: Post intubation tip of the endotracheal tube in good position Electronically Signed   By: Fredrich Jefferson M.D.   On: 02/17/2024 09:45   DG Chest Port 1 View Result Date: 02/17/2024 CLINICAL DATA:  COPD and shortness of breath. EXAM: PORTABLE CHEST 1 VIEW COMPARISON:  10/15/2022 FINDINGS: Stable cardiomediastinal contours. Aortic atherosclerotic calcifications. Bilateral lower lobe airspace opacities identified. Visualized osseous structures are unremarkable. IMPRESSION: Bilateral lower lobe airspace opacities compatible with pneumonia. Aortic Atherosclerosis (ICD10-I70.0). Electronically Signed   By: Kimberley Penman M.D.   On: 02/17/2024 07:59     Procedures .Critical Care  Performed by: Rolinda Climes, DO Authorized by: Rolinda Climes, DO   Critical care provider statement:    Critical care time (minutes):  60   Critical care was necessary to treat or prevent imminent or life-threatening deterioration of the following conditions:  CNS failure or compromise, respiratory failure and metabolic crisis   Critical care was time spent personally by me on the following activities:  Development of treatment plan with patient or surrogate, discussions with consultants, evaluation of patient's response to treatment, examination of patient, ordering and review of laboratory studies, ordering and review of radiographic studies, ordering and performing treatments and interventions, pulse oximetry, re-evaluation of patient's condition and review of old charts   Care discussed with: admitting provider   Procedure Name: Intubation Date/Time: 02/17/2024 9:39 AM  Performed by: Rolinda Climes, DOPre-anesthesia Checklist: Patient identified, Emergency Drugs available, Suction available and Timeout performed Oxygen Delivery Method: Ambu bag Preoxygenation: Pre-oxygenation with 100% oxygen Induction Type: Rapid sequence Ventilation: Mask ventilation without difficulty Laryngoscope Size: Glidescope Grade View: Grade II Tube size: 8.0 mm Number of attempts: 1 Airway Equipment and Method: Patient positioned with wedge pillow, Rigid stylet and Video-laryngoscopy Placement Confirmation: ETT inserted through vocal cords under direct vision, Positive ETCO2 and Breath sounds checked- equal and bilateral Secured at: 24 cm Tube secured with: Tape Dental Injury: Teeth and Oropharynx as per pre-operative assessment  Difficulty Due To: Difficulty was anticipated Comments: Intubated for hyercarbia and airway protection.         Medications Ordered in ED Medications  azithromycin  (ZITHROMAX ) 500 mg in sodium chloride  0.9 % 250 mL IVPB (0 mg Intravenous  Stopped 02/17/24 0942)  propofol  (DIPRIVAN ) 1000 MG/100ML infusion (40 mcg/kg/min  70 kg Intravenous Infusion Verify 02/17/24 1253)  docusate (COLACE) 50 MG/5ML liquid 100 mg (100 mg Per Tube Given  02/17/24 1121)  polyethylene glycol (MIRALAX  / GLYCOLAX ) packet 17 g (17 g Per Tube Given 02/17/24 1121)  pantoprazole  (PROTONIX ) injection 40 mg (40 mg Intravenous Given 02/17/24 1121)  Oral care mouth rinse (15 mLs Mouth Rinse Given 02/17/24 1451)  Oral care mouth rinse (has no administration in time range)  fentaNYL  (SUBLIMAZE ) injection 25 mcg (25 mcg Intravenous Given 02/17/24 1213)  fentaNYL  (SUBLIMAZE ) injection 25-100 mcg (100 mcg Intravenous Given 02/17/24 1447)  rocuronium  (ZEMURON ) injection 100 mg (has no administration in time range)  etomidate  (AMIDATE ) injection 20 mg (has no administration in time range)  heparin  injection 5,000 Units (5,000 Units Subcutaneous Given 02/17/24 1450)  arformoterol  (BROVANA ) nebulizer solution 15 mcg (15 mcg Nebulization Not Given 02/17/24 1216)  budesonide  (PULMICORT ) nebulizer solution 0.5 mg (0.5 mg Nebulization Given 02/17/24 1216)  revefenacin  (YUPELRI ) nebulizer solution 175 mcg (175 mcg Nebulization Given 02/17/24 1216)  ipratropium-albuterol  (DUONEB) 0.5-2.5 (3) MG/3ML nebulizer solution 3 mL (has no administration in time range)  Chlorhexidine  Gluconate Cloth 2 % PADS 6 each (6 each Topical Given 02/17/24 1034)  Oral care mouth rinse (has no administration in time range)  cefTRIAXone  (ROCEPHIN ) 1 g in sodium chloride  0.9 % 100 mL IVPB (has no administration in time range)  methylPREDNISolone  sodium succinate (SOLU-MEDROL ) 40 mg/mL injection 40 mg (has no administration in time range)  insulin  aspart (novoLOG ) injection 0-9 Units (2 Units Subcutaneous Given 02/17/24 1155)  feeding supplement (PROSource TF20) liquid 60 mL (60 mLs Per Tube Given 02/17/24 1451)  feeding supplement (VITAL 1.5 CAL) liquid 1,000 mL (1,000 mLs Per Tube New Bag/Given 02/17/24 1535)  scopolamine  (TRANSDERM-SCOP) 1 MG/3DAYS 1.5 mg (1.5 mg Transdermal Patch Applied 02/17/24 1540)  albuterol  (PROVENTIL ) (2.5 MG/3ML) 0.083% nebulizer solution (15 mg/hr Nebulization Given 02/17/24 0746)  ipratropium (ATROVENT ) nebulizer solution 0.5 mg (0.5 mg Nebulization Given 02/17/24 0746)  methylPREDNISolone  sodium succinate (SOLU-MEDROL ) 125 mg/2 mL injection 125 mg (125 mg Intravenous Given 02/17/24 0746)  ondansetron  (ZOFRAN ) injection 4 mg (4 mg Intravenous Given 02/17/24 0756)  cefTRIAXone  (ROCEPHIN ) 1 g in sodium chloride  0.9 % 100 mL IVPB (0 g Intravenous Stopped 02/17/24 0900)  etomidate  (AMIDATE ) 2 MG/ML injection (20 mg  Given 02/17/24 1610)  rocuronium  (ZEMURON ) 100 MG/10ML injection (100 mg  Given 02/17/24 0928)  albuterol  (PROVENTIL ) (2.5 MG/3ML) 0.083% nebulizer solution (10 mg/hr Nebulization Given 02/17/24 0940)  ipratropium (ATROVENT ) nebulizer solution 0.5 mg (0.5 mg Nebulization Given 02/17/24 0940)    ED Course/ Medical Decision Making/ A&P Clinical Course as of 02/17/24 1542  Fri Feb 17, 2024  0759 Per note on 09/10/23: "# 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 Sisters reported that patient often drinks a lot of water and unknown amount of alcohol  at [facility] where he's LTC resident -- Patient was previously hospitalized for a similar issue and discharged on 07/10/2023 -- Patient required hypertonic saline and BiPAP for respiratory failure and is admitted to MICU A -- Patient's hyponatremia most likely due to poor po intake and excessive water intake -- Has overcorrected quickly during prior admissions -- Hyponatremia labs notable for serum 232, Urine osm 107, urine Na < 13 -- UDS neg, Ethanol < 10, TSH wnl, morning cortisol wnl -- Somewhat mixed picture as labs consistent with  SIADH, hypothyroidism, adrenal insuff, or drug use, but  clinical context consistent with primary polydipsia, beer potomania, poor PO intake -ACTH low (1.3) in setting of steroid administration, but with normal cortisol -BP has been normal, but will CTM for signs of adrenal insufficiency -- Patient's baseline seems to be around 130's - now at baseline  " [TY]  0804 Called patient's sister, she notes that he is typically alert and oriented x 3 able to carry on a normal conversation does need some help with ADLs somewhat.  She confirmed patient full code.  She also corroborated my chart review that patient with polydipsia. [TY]  0818 WBC(!): 18.7 CXR with PNA; will cover with antibiotics.  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  [TY]  [id number] Case discussed with ICU, will come evaluate patient for admission. [TY]  0932 Patient's mentation seemingly worsened after completion of hour-long breathing treatment.  pCO2 worsened as well.  Intubated for airway protection as well as hypercarbia. [TY]    Clinical Course User Index [TY] Rolinda Climes, DO                                 Medical Decision Making This is a 68 year old male presenting the emergency department for shortness of breath.  Complex past medical history to include COPD (on 2L [place]), EtOH/Polysubstance use, HTN, chronic hyponatremia, bilateral BKA.  Patient hypoxic per EMS reports.  He is somewhat somnolent, but able to tell me his name and where he is at on initial evaluation.  Lungs with poor air movement and wheezing.  Placed on BiPAP as well as hour-long breathing treatment.  Initial VBG with CO2 retention.  Mentation was seemingly improving, however after breathing treatment patient came more agitated and began pulling off mask several  times.  Worsening CO2 and became unarousable.  Intubated.  Of note he also has hyponatremia 112.  Likely secondary to polydipsia.  Did speak to family; see ED course.  Case discussed with ICU recommended hold off on hypertonic saline and will admit patient.  Amount and/or Complexity of Data Reviewed Labs: ordered. Decision-making details documented in ED Course. Radiology: ordered. ECG/medicine tests: ordered.  Risk Prescription drug management. Decision regarding hospitalization.         Final Clinical Impression(s) / ED Diagnoses Final diagnoses:  COPD exacerbation (HCC)  Hyponatremia    Rx / DC Orders ED Discharge Orders     None         Rolinda Climes, DO 02/17/24 1542

## 2024-02-17 NOTE — Progress Notes (Signed)
 Patient transported on the ventilator to CT and then to 1223 with no complications. Vitals stable.

## 2024-02-17 NOTE — ED Notes (Signed)
 Patient ripped Bipap off. Respiratory at bedside.

## 2024-02-17 NOTE — Plan of Care (Signed)
  Problem: Respiratory: Goal: Ability to maintain a clear airway and adequate ventilation will improve Outcome: Not Progressing   Problem: Role Relationship: Goal: Method of communication will improve Outcome: Not Progressing   Problem: Education: Goal: Knowledge of General Education information will improve Description: Including pain rating scale, medication(s)/side effects and non-pharmacologic comfort measures Outcome: Not Progressing   Problem: Health Behavior/Discharge Planning: Goal: Ability to manage health-related needs will improve Outcome: Not Progressing   Problem: Activity: Goal: Risk for activity intolerance will decrease Outcome: Not Progressing

## 2024-02-17 NOTE — ED Triage Notes (Signed)
 PT BIB EMS coming from [facility] c/o Respiratory distress x2 days. Per EMS pt 82% in RA at scene. Pt lethargic more than usual today. No oxygen at baseline  EMS: BP 190/80, HR 84,Spo2 DuoNeb. Patient given duoneb upon arrival per EMS

## 2024-02-17 NOTE — ED Notes (Addendum)
 Dr. Linder Revere at bedside to intubate. Patient became lethargic, unresponsive to painful stimuli. Respiratory at bedside.

## 2024-02-17 NOTE — Progress Notes (Signed)
 Initial Nutrition Assessment  DOCUMENTATION CODES:   Not applicable  INTERVENTION:  - Initiate tube feeding via OGT: Vital 1.5 at 50 ml/h (1200 ml per day) *Start at 39mL/hr and advance by 10mL Q8H Prosource TF20 60 ml daily Provides 1880 kcal, 101 gm protein, 917 ml free water daily  - Monitor magnesium , potassium, and phosphorus BID for at least 3 days, MD to replete as needed, as pt may be at risk for refeeding syndrome.  - FWF per CCM.  - Monitor weight trends.    NUTRITION DIAGNOSIS:   Inadequate oral intake related to inability to eat as evidenced by NPO status.  GOAL:   Patient will meet greater than or equal to 90% of their needs  MONITOR:   Vent status, Labs, Weight trends, TF tolerance  REASON FOR ASSESSMENT:   Consult Enteral/tube feeding initiation and management  ASSESSMENT:   68 yo M PMH emphysema, Chronic hypoxia on 2L, tobacco use, R apical lung nodule, chronic hypoNa (baseline 130), s/p bilateral LE amputations who presented with SOB and lethargy. Admitted for acute encephalopathy and acute on chronic respiratory failure.   6/6 Admit; Intubated  Patient is currently intubated on ventilator support MV: 12.6 L/min Temp (24hrs), Avg:96.6 F (35.9 C), Min:95.4 F (35.2 C), Max:99 F (37.2 C)  No family at bedside at time of visit.  Per chart review, no weight history within the past year to assess for recent changes.   OGT placed, xray verified in the stomach. Per CCM, can start tube feeds today and advance towards goal. Discussed with RN.   Medications reviewed and include: Colace, Miralax  Propofol  @ 16.50mL/hr (provides 443 kcals over 24 hours)  Labs reviewed:  Na 112   NUTRITION - FOCUSED PHYSICAL EXAM:  Flowsheet Row Most Recent Value  Orbital Region Moderate depletion  Upper Arm Region No depletion  Thoracic and Lumbar Region Unable to assess  Buccal Region Unable to assess  Temple Region Moderate depletion  Clavicle Bone Region  Mild depletion  Clavicle and Acromion Bone Region Unable to assess  Scapular Bone Region Unable to assess  Dorsal Hand No depletion  Patellar Region Mild depletion  Anterior Thigh Region Mild depletion  Posterior Calf Region Unable to assess  [bilateral BKA's]  Edema (RD Assessment) None  Hair Reviewed  Eyes Unable to assess  Mouth Unable to assess  Skin Reviewed  Nails Reviewed       Diet Order:   Diet Order             Diet NPO time specified  Diet effective now                   EDUCATION NEEDS:  Not appropriate for education at this time  Skin:  Skin Assessment: Reviewed RN Assessment  Last BM:  unknown  Height:  Ht Readings from Last 1 Encounters:  02/17/24 5\' 7"  (1.702 m)   Weight:  Wt Readings from Last 1 Encounters:  02/17/24 70 kg   Ideal Body Weight:  58.5 kg (adjusted for bilateral BKA's)  BMI:  Body mass index is 24.17 kg/m.  Estimated Nutritional Needs:  Kcal:  1750-2100 kcals Protein:  100-115 grams Fluid:  >/= 1.8L    Scheryl Cushing RD, LDN Contact via Secure Chat.

## 2024-02-18 ENCOUNTER — Other Ambulatory Visit: Payer: Self-pay

## 2024-02-18 DIAGNOSIS — R739 Hyperglycemia, unspecified: Secondary | ICD-10-CM

## 2024-02-18 LAB — BLOOD GAS, VENOUS
Acid-Base Excess: 6.4 mmol/L — ABNORMAL HIGH (ref 0.0–2.0)
Bicarbonate: 35.2 mmol/L — ABNORMAL HIGH (ref 20.0–28.0)
O2 Saturation: 99.3 %
Patient temperature: 37.2
pCO2, Ven: 71 mmHg (ref 44–60)
pH, Ven: 7.31 (ref 7.25–7.43)
pO2, Ven: 96 mmHg — ABNORMAL HIGH (ref 32–45)

## 2024-02-18 LAB — BLOOD GAS, ARTERIAL
Acid-Base Excess: 3.8 mmol/L — ABNORMAL HIGH (ref 0.0–2.0)
Bicarbonate: 32 mmol/L — ABNORMAL HIGH (ref 20.0–28.0)
Drawn by: 25770
FIO2: 65 %
O2 Saturation: 99.9 %
Patient temperature: 36.8
pCO2 arterial: 64 mmHg — ABNORMAL HIGH (ref 32–48)
pH, Arterial: 7.3 — ABNORMAL LOW (ref 7.35–7.45)
pO2, Arterial: 127 mmHg — ABNORMAL HIGH (ref 83–108)

## 2024-02-18 LAB — GLUCOSE, CAPILLARY
Glucose-Capillary: 108 mg/dL — ABNORMAL HIGH (ref 70–99)
Glucose-Capillary: 108 mg/dL — ABNORMAL HIGH (ref 70–99)
Glucose-Capillary: 119 mg/dL — ABNORMAL HIGH (ref 70–99)
Glucose-Capillary: 126 mg/dL — ABNORMAL HIGH (ref 70–99)
Glucose-Capillary: 130 mg/dL — ABNORMAL HIGH (ref 70–99)
Glucose-Capillary: 140 mg/dL — ABNORMAL HIGH (ref 70–99)

## 2024-02-18 LAB — PHOSPHORUS
Phosphorus: 3.2 mg/dL (ref 2.5–4.6)
Phosphorus: 3.6 mg/dL (ref 2.5–4.6)

## 2024-02-18 LAB — BASIC METABOLIC PANEL WITH GFR
Anion gap: 12 (ref 5–15)
BUN: 18 mg/dL (ref 8–23)
CO2: 24 mmol/L (ref 22–32)
Calcium: 8.2 mg/dL — ABNORMAL LOW (ref 8.9–10.3)
Chloride: 80 mmol/L — ABNORMAL LOW (ref 98–111)
Creatinine, Ser: 0.66 mg/dL (ref 0.61–1.24)
GFR, Estimated: >60 mL/min (ref >60)
Glucose, Bld: 138 mg/dL — ABNORMAL HIGH (ref 70–99)
Potassium: 3.7 mmol/L (ref 3.5–5.1)
Sodium: 116 mmol/L — CL (ref 135–145)

## 2024-02-18 LAB — CBC
HCT: 32.6 % — ABNORMAL LOW (ref 39.0–52.0)
Hemoglobin: 11.8 g/dL — ABNORMAL LOW (ref 13.0–17.0)
MCH: 30.4 pg (ref 26.0–34.0)
MCHC: 36.2 g/dL — ABNORMAL HIGH (ref 30.0–36.0)
MCV: 84 fL (ref 80.0–100.0)
Platelets: 301 10*3/uL (ref 150–400)
RBC: 3.88 MIL/uL — ABNORMAL LOW (ref 4.22–5.81)
RDW: 13 % (ref 11.5–15.5)
WBC: 11 10*3/uL — ABNORMAL HIGH (ref 4.0–10.5)
nRBC: 0 % (ref 0.0–0.2)

## 2024-02-18 LAB — SODIUM
Sodium: 115 mmol/L — CL (ref 135–145)
Sodium: 118 mmol/L — CL (ref 135–145)
Sodium: 120 mmol/L — ABNORMAL LOW (ref 135–145)
Sodium: 127 mmol/L — ABNORMAL LOW (ref 135–145)

## 2024-02-18 LAB — MAGNESIUM
Magnesium: 2.1 mg/dL (ref 1.7–2.4)
Magnesium: 2.4 mg/dL (ref 1.7–2.4)

## 2024-02-18 LAB — TRIGLYCERIDES: Triglycerides: 90 mg/dL (ref <150)

## 2024-02-18 MED ORDER — NICOTINE 7 MG/24HR TD PT24
7.0000 mg | MEDICATED_PATCH | Freq: Every day | TRANSDERMAL | Status: DC
  Administered 2024-02-18: 7 mg via TRANSDERMAL
  Filled 2024-02-18 (×2): qty 1

## 2024-02-18 MED ORDER — CHLORHEXIDINE GLUCONATE CLOTH 2 % EX PADS
6.0000 | MEDICATED_PAD | Freq: Every evening | CUTANEOUS | Status: DC
  Administered 2024-02-19 – 2024-02-22 (×5): 6 via TOPICAL

## 2024-02-18 MED ORDER — ORAL CARE MOUTH RINSE: 15.0000 mL | OROMUCOSAL | Status: DC | PRN

## 2024-02-18 MED ORDER — DEXMEDETOMIDINE HCL IN NACL 200 MCG/50ML IV SOLN
0.0000 ug/kg/h | INTRAVENOUS | Status: DC
  Administered 2024-02-18: 0.5 ug/kg/h via INTRAVENOUS
  Administered 2024-02-18: 0.4 ug/kg/h via INTRAVENOUS
  Administered 2024-02-19 (×2): 0.5 ug/kg/h via INTRAVENOUS
  Filled 2024-02-18 (×4): qty 50

## 2024-02-18 MED ORDER — POTASSIUM CHLORIDE 20 MEQ PO PACK
40.0000 meq | PACK | Freq: Once | ORAL | Status: AC
  Administered 2024-02-18: 40 meq
  Filled 2024-02-18: qty 2

## 2024-02-18 NOTE — Progress Notes (Signed)
 NAME:  Ronald  [name], MRN:  [id number], DOB:  [birth year], LOS: 1 ADMISSION DATE:  02/17/2024, CONSULTATION DATE:  02/17/24 REFERRING MD:  Linder Revere - EM, CHIEF COMPLAINT:  AMS    History of Present Illness:  68 yo M PMH emphysema on advair /albuterol , Chronic hypoxia on 2L, tobacco use, R apical lung nodule ([facility]), chronic hypoNa (baseline 130, was [facility] 107) who presented to [facility] ED 6/6 from his facility [facility] w CC SOB and lethargy. In ED initially breathing felt somewhat improved but subsequently declined w SOB, wheezing, poor air movement + progressive confusion/lethargy. Labs significant for Na 112 and Abg after his resp/mental status worsened was 7.06/102/144. WBC 19,CXR w bibasilar ASD   EM has sent urine Na urine osm  -- still pending   He was started on BiPAP   PCCM was consulted in this setting  He acutely declined on BiPAP and required intubation in the ED shortly before PCCM could evaluate the pt    Chart reviewed, looks like on imaging June 2024 to Jan 2025, RUL nodule was felt unchanged and likely benign scar as PET was neg.  In review of prior admission for hypoNa- felt r/t incr water intake, etoh intake    Pertinent  Medical History  COPD Tobacco use Lung nodule S/p BLE amputations   Significant Hospital Events: Including procedures, antibiotic start and stop dates in addition to other pertinent events     Interim History / Subjective:  Na 113 > 115 > 116  Osmolality > 237   Objective    Blood pressure (!) 107/55, pulse 73, temperature 98.4 F (36.9 C), temperature source Bladder, resp. rate (!) 24, height 5\' 7"  (1.702 m), weight 89.8 kg, SpO2 100%.    Vent Mode: PRVC FiO2 (%):  [50 %-100 %] 50 % Set Rate:  [24 bmp] 24 bmp Vt Set:  [520 mL] 520 mL PEEP:  [5 cmH20-6 cmH20] 5 cmH20 Pressure Support:  [14 cmH20] 14 cmH20 Plateau Pressure:  [17 cmH20-25 cmH20] 25 cmH20   Intake/Output Summary (Last 24 hours) at 02/18/2024  4098 Last data filed at 02/18/2024 0700 Gross per 24 hour  Intake 2233.88 ml  Output 1425 ml  Net 808.88 ml   Filed Weights   02/17/24 0815 02/18/24 0600  Weight: 70 kg 89.8 kg    Examination: General: Chronically ill-appearing man, comfortable HENT: ET tube in good position, some clear secretions Lungs: Somewhat shallow breaths but overall good air movement, no wheezes or crackles Cardiovascular: Regular, no murmur Abdomen: Protuberant, nondistended, nontender Extremities: Bilateral lower extremity BKA Neuro: Awake, nods to questions, follows commands, moderate cough strength  Resolved problem list   Assessment and Plan   Acute encephalopathy, multifactorial -hypercarbia, hyponatremia -think CO2 might be bigger driver of the two based on timeline in ED P -work to correct underlying contributors, hyponatremia and hypercapnia -wean propofol  as able  Acute on chronic respiratory failure with hypoxemia and hypercarbia  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 Felt benign/scarring.  -flu covid rsv neg  P -PRVC 8 cc/kg, follow ABG for resolution hypercapnia -push for WUA and SBT as resp status improves -sedation as per PAD protocol -VAP prevention orderset -pulm hygiene -CT chest with no change apical RUL 7x28mm nodule, f/u as [facility]pt at [facility] -continue BD, ICS -solumedrol 40mg  q12h, wean once airflows improve -complete 5 days total  ceftriaxone  and azithromycin    AoC hyponatremia -baseline 130s. Prior hospitalization late 2024 for hypoNa felt related to low solute intake. ADH normal, ACTH low but was on steroids for AECOPD   -we dont have a great hx acutely, could be more of the same (high water intake) - serum osm low P -fluid restrict -follow serial Na -no cl;ear role for 3%NaCl at this time. Will consider based on na trend and also MS once we can lift  sedation (he has quickly corrected / overcorrected with 3% on prior admissions)  HTN -following on sedation, no scheduled meds yet  Hyperglycemia -SSI orders in place   Best Practice (right click and "Reselect all SmartList Selections" daily)   Diet/type: tubefeeds DVT prophylaxis prophylactic heparin   Pressure ulcer(s): pressure ulcer assessment deferred  GI prophylaxis: PPI Lines: N/A Foley:  N/A Code Status:  full code Last date of multidisciplinary goals of care discussion [--]  Labs   CBC: Recent Labs  Lab 02/17/24 0737 02/17/24 0747 02/17/24 1228 02/18/24 0449  WBC 18.7*  --  20.5* 11.0*  NEUTROABS 16.9*  --   --   --   HGB 13.7 16.0 12.3* 11.8*  HCT 38.9* 47.0 34.7* 32.6*  MCV 84.6  --  85.3 84.0  PLT 377  --  308 301    Basic Metabolic Panel: Recent Labs  Lab 02/17/24 0737 02/17/24 0747 02/17/24 1228 02/17/24 1428 02/17/24 1652 02/17/24 1952 02/18/24 0231 02/18/24 0449  NA 112* 112* 115* 113*  --  113* 115* 116*  K 4.7 5.6*  --   --   --   --   --  3.7  CL 73* 74*  --   --   --   --   --  80*  CO2 29  --   --   --   --   --   --  24  GLUCOSE 115* 119*  --   --   --   --   --  138*  BUN 7* 7*  --   --   --   --   --  18  CREATININE 0.37* 0.50* 0.55*  --   --   --   --  0.66  CALCIUM  8.2*  --   --   --   --   --   --  8.2*  MG  --   --   --   --  1.5*  --   --  2.1  PHOS  --   --   --   --  <1.0*  --   --  3.2   GFR: Estimated Creatinine Clearance: 97.1 mL/min (by C-G formula based on SCr of 0.66 mg/dL). Recent Labs  Lab 02/17/24 0737 02/17/24 0748 02/17/24 1228 02/17/24 1400 02/17/24 1652 02/18/24 0449  WBC 18.7*  --  20.5*  --   --  11.0*  LATICACIDVEN  --  0.4* 5.0* 4.1* 2.4*  --     Liver Function Tests: Recent Labs  Lab 02/17/24 1428  AST 19  ALT 8  ALKPHOS 70  BILITOT 1.1  PROT 6.9  ALBUMIN 3.4*   No results for input(s): "LIPASE", "AMYLASE" in the last 168 hours. No results for input(s): "AMMONIA" in the last 168  hours.  ABG    Component Value Date/Time   PHART 7.42 02/17/2024 1033   PCO2ART 37 02/17/2024 1033   PO2ART 376 (H) 02/17/2024 1033   HCO3 24.2 02/17/2024 1033   TCO2 36 (H) 02/17/2024 0747  ACIDBASEDEF 0.5 02/17/2024 1033   O2SAT 100 02/17/2024 1033     Coagulation Profile: No results for input(s): "INR", "PROTIME" in the last 168 hours.  Cardiac Enzymes: No results for input(s): "CKTOTAL", "CKMB", "CKMBINDEX", "TROPONINI" in the last 168 hours.  HbA1C: Hgb A1c MFr Bld  Date/Time Value Ref Range Status  02/17/2024 12:28 PM 5.4 4.8 - 5.6 % Final    Comment:    (NOTE) Diagnosis of Diabetes The following HbA1c ranges recommended by the American Diabetes Association (ADA) may be used as an aid in the diagnosis of diabetes mellitus.  Hemoglobin             Suggested A1C NGSP%              Diagnosis  <5.7                   Non Diabetic  5.7-6.4                Pre-Diabetic  >6.4                   Diabetic  <7.0                   Glycemic control for                       adults with diabetes.    09/02/2022 09:03 PM 5.6 4.8 - 5.6 % Final    Comment:    (NOTE)         Prediabetes: 5.7 - 6.4         Diabetes: >6.4         Glycemic control for adults with diabetes: <7.0     CBG: Recent Labs  Lab 02/17/24 1152 02/17/24 1544 02/17/24 2030 02/17/24 2325 02/18/24 0347  GLUCAP 197* 135* 132* 135* 130*     Critical care time: 33 min       CRITICAL CARE Performed by: Denson Flake   Total critical care time: 33 min  Geronimo Krabbe, MD, PhD 02/18/2024, 7:12 AM [facility] [phone] or if no answer before 7:00PM call [phone] For any issues after 7:00PM please call eLink [phone] b

## 2024-02-18 NOTE — Progress Notes (Signed)
Notified Lab that ABG being sent for analysis. 

## 2024-02-18 NOTE — Plan of Care (Signed)
  Problem: Activity: Goal: Ability to tolerate increased activity will improve Outcome: Progressing   Problem: Respiratory: Goal: Ability to maintain a clear airway and adequate ventilation will improve Outcome: Progressing   Problem: Role Relationship: Goal: Method of communication will improve Outcome: Not Progressing   Problem: Education: Goal: Knowledge of General Education information will improve Description: Including pain rating scale, medication(s)/side effects and non-pharmacologic comfort measures Outcome: Not Progressing

## 2024-02-18 NOTE — Procedures (Signed)
 Extubation Procedure Note  Patient Details:   Name: Ronald [name]  Iwan DOB: 09-06-1956 MRN: [id number]   Airway Documentation:    Vent end date: 02/18/24 Vent end time: 1055   Evaluation  O2 sats: currently acceptable Complications: No apparent complications Patient did tolerate procedure well. Bilateral Breath Sounds: Clear, Diminished   Yes  Rosia Cook Nannette 02/18/2024, 10:57 AM  Positive cuff leak pre extubation. Placed on 6 LPM post extubation- increased to Heated High Flow system.

## 2024-02-18 NOTE — Progress Notes (Addendum)
 eLink Physician-Brief Progress Note Patient Name: Ronald [name] DOB: 1956-05-13 MRN: [id number]   Date of Service  02/18/2024  HPI/Events of Note  Agitated and restless. Attempting to get out of bed and pull off his HHFNC. Confused. Not new, been confused since extubation today. But is worse. Unable to keep patient calm.   Camera: VS stable, HR 106. MAP > 70.  Discussed with RN. Confused.  K was at 3.7 Last sodium 120, up from 118 from noon.  Mag 2.4   eICU Interventions  Low dose precedex  trial and avoiding haldol for seizure risks. Get VBG, s/p extubation status. Respiratory wise stable.  Smoker- start nicotine  patch.  Follow sodium, sz and aspiration precautions     Intervention Category Minor Interventions: Agitation / anxiety - evaluation and management  Rexann Catalan 02/18/2024, 8:20 PM  21:45  VBG reviewed.improving respiratory acidosis. pH 7.31. hco3 at 35. Metabolic alkalosis +.  Continue care. Keep K/Mag above 4/2 respectively. Avoid dehydration. \\  05:43 Refusing sq heparin  while he was agitated. Discussed with RN. To give sq heparin  later on once he is calmer.

## 2024-02-18 NOTE — Progress Notes (Signed)
 [facility] ADULT ICU REPLACEMENT PROTOCOL   The patient does apply for the [facility] Adult ICU Electrolyte Replacment Protocol based on the criteria listed below:   1.Exclusion criteria: TCTS, ECMO, Dialysis, and Myasthenia Gravis patients 2. Is GFR >/= 30 ml/min? Yes.    Patient's GFR today is >60 3. Is SCr </= 2? Yes.   Patient's SCr is 0.66 mg/dL 4. Did SCr increase >/= 0.5 in 24 hours? No. 5.Pt's weight >40kg  Yes.   6. Abnormal electrolyte(s): potassium 3.7  7. Electrolytes replaced per protocol 8.  Call MD STAT for K+ </= 2.5, Phos </= 1, or Mag </= 1 Physician:  protocol  Marva Sleight 02/18/2024 6:33 AM

## 2024-02-19 LAB — GLUCOSE, CAPILLARY
Glucose-Capillary: 116 mg/dL — ABNORMAL HIGH (ref 70–99)
Glucose-Capillary: 120 mg/dL — ABNORMAL HIGH (ref 70–99)
Glucose-Capillary: 117 mg/dL — ABNORMAL HIGH (ref 70–99)
Glucose-Capillary: 137 mg/dL — ABNORMAL HIGH (ref 70–99)
Glucose-Capillary: 111 mg/dL — ABNORMAL HIGH (ref 70–99)
Glucose-Capillary: 114 mg/dL — ABNORMAL HIGH (ref 70–99)

## 2024-02-19 LAB — PHOSPHORUS: Phosphorus: 2.8 mg/dL (ref 2.5–4.6)

## 2024-02-19 LAB — SODIUM
Sodium: 130 mmol/L — ABNORMAL LOW (ref 135–145)
Sodium: 129 mmol/L — ABNORMAL LOW (ref 135–145)
Sodium: 130 mmol/L — ABNORMAL LOW (ref 135–145)

## 2024-02-19 LAB — BASIC METABOLIC PANEL WITH GFR
Anion gap: 9 (ref 5–15)
BUN: 15 mg/dL (ref 8–23)
CO2: 32 mmol/L (ref 22–32)
Calcium: 8.9 mg/dL (ref 8.9–10.3)
Chloride: 90 mmol/L — ABNORMAL LOW (ref 98–111)
Creatinine, Ser: 0.61 mg/dL (ref 0.61–1.24)
GFR, Estimated: >60 mL/min (ref >60)
Glucose, Bld: 128 mg/dL — ABNORMAL HIGH (ref 70–99)
Potassium: 4.5 mmol/L (ref 3.5–5.1)
Sodium: 131 mmol/L — ABNORMAL LOW (ref 135–145)

## 2024-02-19 LAB — CBC
HCT: 37.5 % — ABNORMAL LOW (ref 39.0–52.0)
Hemoglobin: 12.4 g/dL — ABNORMAL LOW (ref 13.0–17.0)
MCH: 29.5 pg (ref 26.0–34.0)
MCHC: 33.1 g/dL (ref 30.0–36.0)
MCV: 89.3 fL (ref 80.0–100.0)
Platelets: 346 10*3/uL (ref 150–400)
RBC: 4.2 MIL/uL — ABNORMAL LOW (ref 4.22–5.81)
RDW: 14 % (ref 11.5–15.5)
WBC: 13.9 10*3/uL — ABNORMAL HIGH (ref 4.0–10.5)
nRBC: 0 % (ref 0.0–0.2)

## 2024-02-19 LAB — MAGNESIUM: Magnesium: 2.5 mg/dL — ABNORMAL HIGH (ref 1.7–2.4)

## 2024-02-19 MED ORDER — METHYLPREDNISOLONE SODIUM SUCC 40 MG IJ SOLR
40.0000 mg | Freq: Every day | INTRAMUSCULAR | Status: DC
  Administered 2024-02-19: 40 mg via INTRAVENOUS
  Filled 2024-02-19: qty 1

## 2024-02-19 MED ORDER — POLYETHYLENE GLYCOL 3350 17 G PO PACK
17.0000 g | PACK | Freq: Every day | ORAL | Status: DC
  Administered 2024-02-20: 17 g via ORAL
  Filled 2024-02-19: qty 1

## 2024-02-19 MED ORDER — BUSPIRONE HCL 5 MG PO TABS
5.0000 mg | ORAL_TABLET | Freq: Every day | ORAL | Status: DC
  Administered 2024-02-19 – 2024-02-22 (×4): 5 mg via ORAL
  Filled 2024-02-19 (×4): qty 1

## 2024-02-19 MED ORDER — MELATONIN 3 MG PO TABS
3.0000 mg | ORAL_TABLET | Freq: Every day | ORAL | Status: DC
  Administered 2024-02-19 – 2024-02-22 (×4): 3 mg via ORAL
  Filled 2024-02-19 (×4): qty 1

## 2024-02-19 NOTE — Progress Notes (Signed)
 PT Cancellation Note  Patient Details Name: Ronald [name] MRN: [id number] DOB: 1955-11-20   Cancelled Treatment:    Reason Eval/Treat Not Completed: PT screened, no needs identified, will sign off   Pt requires care for all aspects of mobility and ADLs per staff. Pt is from SNF and returning to same.     [name] 02/19/2024, 9:38 AM

## 2024-02-19 NOTE — Progress Notes (Signed)
 NAME:  Ronald  [name], MRN:  [id number], DOB:  Feb 21, 1956, LOS: 2 ADMISSION DATE:  02/17/2024, CONSULTATION DATE:  02/17/24 REFERRING MD:  Linder Revere - EM, CHIEF COMPLAINT:  AMS    History of Present Illness:  68 yo M PMH emphysema on advair /albuterol , Chronic hypoxia on 2L, tobacco use, R apical lung nodule ([facility]s [facility]), chronic hypoNa (baseline 130, was [facility] w Na 107) who presented to [facility] ED 6/6 from his facility [facility] w CC SOB and lethargy. In ED initially breathing felt somewhat improved but subsequently declined w SOB, wheezing, poor air movement + progressive confusion/lethargy. Labs significant for Na 112 and Abg after his resp/mental status worsened was 7.06/102/144. WBC 19,CXR w bibasilar ASD   EM has sent urine Na urine osm  -- still pending   He was started on BiPAP   PCCM was consulted in this setting  He acutely declined on BiPAP and required intubation in the ED shortly before PCCM could evaluate the pt    Chart reviewed, looks like on imaging June 2024 to Jan 2025, RUL nodule was felt unchanged and likely benign scar as PET was neg.  In review of prior admission for hypoNa- felt r/t incr water intake, etoh intake    Pertinent  Medical History  COPD Tobacco use Lung nodule S/p BLE amputations   Significant Hospital Events: Including procedures, antibiotic start and stop dates in addition to other pertinent events   6/7 extubated to HFNC 6/7 required Precedex  initiation o/n  Interim History / Subjective:  Na 131  Somewhat agitated confused since extubation, required Precedex  initiation overnight, currently 0.5 High flow nasal cannula 40 L/min (45%)   Objective    Blood pressure 128/68, pulse 69, temperature 98.4 F (36.9 C), temperature source Oral, resp. rate 11, height 5\' 7"  (1.702 m), weight 83.5 kg, SpO2 93%.    Vent Mode: CPAP;PSV FiO2 (%):  [40 %-65 %] 45 % Set Rate:  [24 bmp] 24 bmp Vt Set:  [520 mL] 520 mL PEEP:  [5 cmH20]  5 cmH20 Pressure Support:  [10 cmH20] 10 cmH20 Plateau Pressure:  [14 cmH20] 14 cmH20   Intake/Output Summary (Last 24 hours) at 02/19/2024 0721 Last data filed at 02/19/2024 0515 Gross per 24 hour  Intake 606.94 ml  Output 2700 ml  Net -2093.06 ml   Filed Weights   02/17/24 0815 02/18/24 0600 02/19/24 0300  Weight: 70 kg 89.8 kg 83.5 kg    Examination: General: Chronically ill-appearing man laying in bed on high flow nasal cannula, no distress HENT: Oropharynx clear, strong voice although mild dysarthria, no secretions Lungs: Shallow breaths, better air movement, no wheezing or crackles Cardiovascular: Regular, no murmur Abdomen: Protuberant but nontender, positive bowel sounds Extremities: Bilateral BKA Neuro: Alert, nods to questions, pleasantly confused.  Poorly oriented.  Does follow commands  Resolved problem list   Assessment and Plan   Acute encephalopathy, multifactorial Agitated delirium - ? 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 Felt benign/scarring.  -flu covid  rsv neg  P -extubated to HFNC, wean FiO2 as able -pulm hygiene -minimize sedation -continue BD, ICS -decrease solumedrol 6/8 as contributor to delirium, convert to prednisone  when able to reliably take PO -CT chest with no change apical RUL 7x66mm nodule, f/u as outpt at [facility] -complete 5 days total ceftriaxone  + azithro  AoC hyponatremia -baseline 130s. Prior hospitalization late 2024  for hypoNa felt related to low solute intake. ADH normal, ACTH low but was on steroids for AECOPD   -back to baseline with fluid restriction P -continue to follow Na -fluid restriction  HTN -restart home BP regimen when BP dictates  Hyperglycemia -continue SSI orders in place   Best Practice (right click and "Reselect all SmartList Selections" daily)   Diet/type: tubefeeds DVT prophylaxis prophylactic heparin   Pressure ulcer(s): pressure ulcer assessment deferred  GI prophylaxis: PPI Lines: N/A Foley:  N/A Code Status:  full code Last date of multidisciplinary goals of care discussion [--]  Labs   CBC: Recent Labs  Lab 02/17/24 0737 02/17/24 0747 02/17/24 1228 02/18/24 0449 02/19/24 0505  WBC 18.7*  --  20.5* 11.0* 13.9*  NEUTROABS 16.9*  --   --   --   --   HGB 13.7 16.0 12.3* 11.8* 12.4*  HCT 38.9* 47.0 34.7* 32.6* 37.5*  MCV 84.6  --  85.3 84.0 89.3  PLT 377  --  308 301 346    Basic Metabolic Panel: Recent Labs  Lab 02/17/24 0737 02/17/24 0747 02/17/24 1228 02/17/24 1428 02/17/24 1652 02/17/24 1952 02/18/24 0449 02/18/24 0835 02/18/24 1406 02/18/24 1645 02/18/24 2017 02/19/24 0220 02/19/24 0505  NA 112* 112* 115*   < >  --    < > 116* 118* 120*  --  127* 130* 131*  K 4.7 5.6*  --   --   --   --  3.7  --   --   --   --   --  4.5  CL 73* 74*  --   --   --   --  80*  --   --   --   --   --  90*  CO2 29  --   --   --   --   --  24  --   --   --   --   --  32  GLUCOSE 115* 119*  --   --   --   --  138*  --   --   --   --   --  128*  BUN 7* 7*  --   --   --   --  18  --   --   --   --   --  15  CREATININE 0.37* 0.50* 0.55*  --   --   --  0.66  --   --   --   --   --  0.61  CALCIUM  8.2*  --   --   --   --   --  8.2*  --   --   --   --   --  8.9  MG  --   --   --   --  1.5*  --  2.1  --   --  2.4  --   --  2.5*  PHOS  --   --   --   --  <1.0*  --  3.2  --   --  3.6  --   --  2.8   < > =  values in this interval not displayed.   GFR: Estimated  Creatinine Clearance: 93.9 mL/min (by C-G formula based on SCr of 0.61 mg/dL). Recent Labs  Lab 02/17/24 0737 02/17/24 0748 02/17/24 1228 02/17/24 1400 02/17/24 1652 02/18/24 0449 02/19/24 0505  WBC 18.7*  --  20.5*  --   --  11.0* 13.9*  LATICACIDVEN  --  0.4* 5.0* 4.1* 2.4*  --   --     Liver Function Tests: Recent Labs  Lab 02/17/24 1428  AST 19  ALT 8  ALKPHOS 70  BILITOT 1.1  PROT 6.9  ALBUMIN 3.4*   No results for input(s): "LIPASE", "AMYLASE" in the last 168 hours. No results for input(s): "AMMONIA" in the last 168 hours.  ABG    Component Value Date/Time   PHART 7.3 (L) 02/18/2024 1230   PCO2ART 64 (H) 02/18/2024 1230   PO2ART 127 (H) 02/18/2024 1230   HCO3 35.2 (H) 02/18/2024 2117   TCO2 36 (H) 02/17/2024 0747   ACIDBASEDEF 0.5 02/17/2024 1033   O2SAT 99.3 02/18/2024 2117     Coagulation Profile: No results for input(s): "INR", "PROTIME" in the last 168 hours.  Cardiac Enzymes: No results for input(s): "CKTOTAL", "CKMB", "CKMBINDEX", "TROPONINI" in the last 168 hours.  HbA1C: Hgb A1c MFr Bld  Date/Time Value Ref Range Status  02/17/2024 12:28 PM 5.4 4.8 - 5.6 % Final    Comment:    (NOTE) Diagnosis of Diabetes The following HbA1c ranges recommended by the American Diabetes Association (ADA) may be used as an aid in the diagnosis of diabetes mellitus.  Hemoglobin             Suggested A1C NGSP%              Diagnosis  <5.7                   Non Diabetic  5.7-6.4                Pre-Diabetic  >6.4                   Diabetic  <7.0                   Glycemic control for                       adults with diabetes.    09/02/2022 09:03 PM 5.6 4.8 - 5.6 % Final    Comment:    (NOTE)         Prediabetes: 5.7 - 6.4         Diabetes: >6.4         Glycemic control for adults with diabetes: <7.0     CBG: Recent Labs  Lab 02/18/24 1124 02/18/24 1537 02/18/24 1942 02/18/24 2356 02/19/24 0504  GLUCAP 126* 119* 108* 108* 116*      Critical care time: 32 min       CRITICAL CARE Performed by: Denson Flake   Total critical care time: 32 min  Geronimo Krabbe, MD, PhD 02/19/2024, 7:21 AM [facility] [phone] or if no answer before 7:00PM call [phone] For any issues after 7:00PM please call eLink [phone] b

## 2024-02-20 ENCOUNTER — Inpatient Hospital Stay (HOSPITAL_COMMUNITY)

## 2024-02-20 DIAGNOSIS — J123 Human metapneumovirus pneumonia: Secondary | ICD-10-CM

## 2024-02-20 LAB — BASIC METABOLIC PANEL WITH GFR
Anion gap: 10 (ref 5–15)
BUN: 12 mg/dL (ref 8–23)
CO2: 33 mmol/L — ABNORMAL HIGH (ref 22–32)
Calcium: 8.9 mg/dL (ref 8.9–10.3)
Chloride: 87 mmol/L — ABNORMAL LOW (ref 98–111)
Creatinine, Ser: 0.58 mg/dL — ABNORMAL LOW (ref 0.61–1.24)
GFR, Estimated: >60 mL/min (ref >60)
Glucose, Bld: 92 mg/dL (ref 70–99)
Potassium: 4 mmol/L (ref 3.5–5.1)
Sodium: 130 mmol/L — ABNORMAL LOW (ref 135–145)

## 2024-02-20 LAB — GLUCOSE, CAPILLARY: Glucose-Capillary: 115 mg/dL — ABNORMAL HIGH (ref 70–99)

## 2024-02-20 LAB — MAGNESIUM: Magnesium: 2.3 mg/dL (ref 1.7–2.4)

## 2024-02-20 LAB — PHOSPHORUS: Phosphorus: 2.5 mg/dL (ref 2.5–4.6)

## 2024-02-20 MED ORDER — PREDNISONE 5 MG PO TABS
30.0000 mg | ORAL_TABLET | Freq: Every day | ORAL | Status: DC
  Administered 2024-02-23: 30 mg via ORAL
  Filled 2024-02-20: qty 1
  Filled 2024-02-20 (×2): qty 2

## 2024-02-20 MED ORDER — FUROSEMIDE 10 MG/ML IJ SOLN
40.0000 mg | Freq: Once | INTRAMUSCULAR | Status: AC
  Administered 2024-02-20: 40 mg via INTRAVENOUS
  Filled 2024-02-20: qty 4

## 2024-02-20 MED ORDER — POTASSIUM CHLORIDE CRYS ER 20 MEQ PO TBCR
40.0000 meq | EXTENDED_RELEASE_TABLET | Freq: Once | ORAL | Status: AC
  Administered 2024-02-20: 40 meq via ORAL
  Filled 2024-02-20: qty 2

## 2024-02-20 MED ORDER — PANTOPRAZOLE SODIUM 40 MG PO TBEC
40.0000 mg | DELAYED_RELEASE_TABLET | Freq: Every day | ORAL | Status: DC
  Administered 2024-02-20 – 2024-02-23 (×4): 40 mg via ORAL
  Filled 2024-02-20 (×4): qty 1

## 2024-02-20 MED ORDER — PREDNISONE 20 MG PO TABS: 20.0000 mg | ORAL_TABLET | Freq: Every day | ORAL | Status: DC

## 2024-02-20 MED ORDER — METOPROLOL TARTRATE 50 MG PO TABS
50.0000 mg | ORAL_TABLET | Freq: Two times a day (BID) | ORAL | Status: DC
  Administered 2024-02-20 – 2024-02-23 (×7): 50 mg via ORAL
  Filled 2024-02-20: qty 2
  Filled 2024-02-20 (×6): qty 1

## 2024-02-20 MED ORDER — AMLODIPINE BESYLATE 5 MG PO TABS
10.0000 mg | ORAL_TABLET | Freq: Every day | ORAL | Status: DC
  Administered 2024-02-20 – 2024-02-23 (×4): 10 mg via ORAL
  Filled 2024-02-20: qty 1
  Filled 2024-02-20 (×3): qty 2

## 2024-02-20 MED ORDER — PREDNISONE 5 MG PO TABS: 10.0000 mg | ORAL_TABLET | Freq: Every day | ORAL | Status: DC

## 2024-02-20 MED ORDER — PREDNISONE 20 MG PO TABS
40.0000 mg | ORAL_TABLET | Freq: Every day | ORAL | Status: AC
  Administered 2024-02-20 – 2024-02-22 (×3): 40 mg via ORAL
  Filled 2024-02-20 (×3): qty 2

## 2024-02-20 NOTE — TOC Initial Note (Signed)
 Transition of Care [facility]) - Initial/Assessment Note    Patient Details  Name: Ronald [name] MRN: [id number] Date of Birth: [birth year]  Transition of Care [facility]) CM/SW Contact:    Tessie Fila, RN Phone Number: 02/20/2024, 11:50 AM  Clinical Narrative:                 Pt is from [facility] nursing facility. Pt POC is his sister Laureen Pon [phone]. NCM spoke with Aundra Lee at [facility] and he confirms pt is a long term resident and can return once pt is ready for discharge. No PT recommendations at this time. Pt currently requiring O2, not baseline. TOC will continue to follow for any new recommendations and DC needs.  Expected Discharge Plan: Long Term Nursing Home [facility]) Barriers to Discharge: Continued Medical Work up   Patient Goals and CMS Choice Patient states their goals for this hospitalization and ongoing recovery are:: To return to facility CMS Medicare.gov Compare Post Acute Care list provided to:: Other (Comment Required) (NA) Choice offered to / list presented to : NA [place] ownership interest in [facility].provided to:: Parent NA    Expected Discharge Plan and Services In-house Referral: NA Discharge Planning Services: CM Consult Post Acute Care Choice: NA Living arrangements for the past 2 months: Skilled Nursing Facility                 DME Arranged: N/A DME Agency: NA       HH Arranged: NA HH Agency: NA        Prior Living Arrangements/Services Living arrangements for the past 2 months: Skilled Nursing Facility Lives with:: Facility Resident Patient language and need for interpreter reviewed:: Yes Do you feel safe going back to the place where you live?: Yes      Need for Family Participation in Patient Care: Yes (Comment) Care giver support system in place?: Yes (comment) Current home services: Other (comment) (NA) Criminal Activity/Legal Involvement Pertinent to Current Situation/Hospitalization: No - Comment as  needed  Activities of Daily Living   ADL Screening (condition at time of admission) Independently performs ADLs?: No Does the patient have a NEW difficulty with bathing/dressing/toileting/self-feeding that is expected to last >3 days?: No Does the patient have a NEW difficulty with getting in/out of bed, walking, or climbing stairs that is expected to last >3 days?: No Does the patient have a NEW difficulty with communication that is expected to last >3 days?: No Is the patient deaf or have difficulty hearing?: No Does the patient have difficulty seeing, even when wearing glasses/contacts?: No Does the patient have difficulty concentrating, remembering, or making decisions?: Yes  Permission Sought/Granted Permission sought to share information with : Family Supports, Magazine features editor Permission granted to share information with : Yes, Verbal Permission Granted  Share Information with NAME: Laureen Pon (Sister)  [phone]  Permission granted to share info w AGENCY: [facility]        Emotional Assessment Appearance:: Other (Comment Required (Unable to assess) Attitude/Demeanor/Rapport: Unable to Assess Affect (typically observed): Unable to Assess Orientation: : Oriented to Self, Oriented to Place, Oriented to  Time, Oriented to Situation Alcohol  / Substance Use: Tobacco Use Psych Involvement: No (comment)  Admission diagnosis:  Hyponatremia [E87.1] COPD exacerbation (HCC) [J44.1] Acute on chronic respiratory failure with hypoxia and hypercapnia (HCC) [M57.84, J96.22] Patient Active Problem List   Diagnosis Date Noted   Acute on chronic respiratory failure with hypoxia and hypercapnia (HCC) 02/17/2024   Acute osteomyelitis of  right calcaneus (HCC) 11/10/2022   PVD (peripheral vascular disease) (HCC) 11/10/2022   Respiratory failure (HCC) 10/15/2022   Chronic hypercapnic respiratory failure (HCC) 09/19/2022   Protein-calorie malnutrition, severe 09/04/2022    Acute hypercapnic respiratory failure (HCC) 09/02/2022   Hx of BKA, left (HCC) 09/02/2022   Hypotension 09/02/2022   Right bundle branch block 09/02/2022   Acute respiratory failure (HCC) 09/02/2022   Hyponatremia 03/26/2019   Altered mental status 03/26/2019   Acute hypoxemic respiratory failure (HCC) 03/26/2019   Dysphagia 03/26/2019   Acute encephalopathy 03/18/2019   COPD exacerbation (HCC) 08/29/2017   Subdural hematoma (HCC) 06/19/2014   Critical lower limb ischemia (HCC) 12/25/2013   Subacute osteomyelitis, right ankle and foot (HCC) 11/06/2013   HTN (hypertension), benign 11/06/2013   Alcohol  abuse 11/06/2013   Osteomyelitis of left foot (HCC) 11/06/2013   COPD (chronic obstructive pulmonary disease) (HCC) 11/06/2013   Accelerated hypertension 09/25/2013   Hypokalemia 09/25/2013   Abscess of left foot 09/21/2013   Cellulitis of left foot 09/19/2013   PCP:  Luba Running, FNP Pharmacy:   [facility] [phone] Jonette Nestle,  - [address] [place] [address] Phone: [phone] Fax: [phone]     Social Drivers of Health (SDOH) Social History: SDOH Screenings   Food Insecurity: No Food Insecurity (02/17/2024)  Housing: Low Risk  (02/17/2024)  Transportation Needs: Patient Unable To Answer (02/17/2024)  Utilities: Not At Risk (02/17/2024)  Financial Resource Strain: Not on File (07/30/2022)   Received from [facility]  Physical Activity: Not on File (12/31/2021)   Received from [place], [place]  Social Connections: Patient Unable To Answer (02/17/2024)  Stress: Not on File (12/31/2021)   Received from [place], [place]  Tobacco Use: High Risk (02/17/2024)   SDOH Interventions:     Readmission Risk Interventions    02/20/2024   11:45 AM  Readmission Risk Prevention Plan  Transportation Screening Complete  PCP or Specialist Appt within 5-7 Days Complete  Home Care Screening Complete  Medication Review (RN CM) Complete

## 2024-02-20 NOTE — Progress Notes (Signed)
 PCXR reviewed Aeration actually improving some Tolerating salter.  Plan Cont rx as outlined Will give 1 time lasix as well

## 2024-02-20 NOTE — Progress Notes (Signed)
 eLink Physician-Brief Progress Note Patient Name: Bastien Strawser DOB: December 13, 1955 MRN: [id number]   Date of Service  02/20/2024  HPI/Events of Note  No DM, bed side asking to discontinue SSI and POC glucose testing. Last glucose 132.   eICU Interventions  Discontinued above. Goal to keep less than 180     Intervention Category Minor Interventions: Other:  Rexann Catalan 02/20/2024, 4:37 AM

## 2024-02-20 NOTE — Plan of Care (Signed)
  Problem: Fluid Volume: Goal: Ability to maintain a balanced intake and output will improve Outcome: Progressing   Problem: Metabolic: Goal: Ability to maintain appropriate glucose levels will improve Outcome: Progressing   Problem: Tissue Perfusion: Goal: Adequacy of tissue perfusion will improve Outcome: Progressing   Problem: Pain Managment: Goal: General experience of comfort will improve and/or be controlled Outcome: Progressing

## 2024-02-20 NOTE — Progress Notes (Signed)
 Pt taken off HHFNC and placed on 12L HFNC.  HR 98, RR 16, sats 93%. NP aware.  Pt is tolerating well.

## 2024-02-20 NOTE — Progress Notes (Signed)
 NAME:  Ronald [name], MRN:  [id number], DOB:  [birth year], LOS: 3 ADMISSION DATE:  02/17/2024, CONSULTATION DATE:  02/17/24 REFERRING MD:  Linder Revere - EM, CHIEF COMPLAINT:  AMS    History of Present Illness:  68 yo M PMH emphysema on advair /albuterol , Chronic hypoxia on 2L, tobacco use, R apical lung nodule (follows w [facility]), chronic hypoNa (baseline 130, was [facility] 12/[facility] w Na 107) who presented to [facility] ED 6/6 from his facility [facility] w CC SOB and lethargy. In ED initially breathing felt somewhat improved but subsequently declined w SOB, wheezing, poor air movement + progressive confusion/lethargy. Labs significant for Na 112 and Abg after his resp/mental status worsened was 7.06/102/144. WBC 19,CXR w bibasilar ASD   EM has sent urine Na urine osm  -- still pending   He was started on BiPAP   PCCM was consulted in this setting  He acutely declined on BiPAP and required intubation in the ED shortly before PCCM could evaluate the pt    Chart reviewed, looks like on imaging June 2024 to Jan 2025, RUL nodule was felt unchanged and likely benign scar as PET was neg.  In review of prior admission for hypoNa- felt r/t incr water intake, etoh intake    Pertinent  Medical History  COPD Tobacco use Lung nodule S/p BLE amputations   Significant Hospital Events: Including procedures, antibiotic start and stop dates in addition to other pertinent events   6/7 extubated to HFNC 6/7 required Precedex  initiation o/n.  6/8 dex off. Diet started. Req'd heated highflow  6/9 transitioning to salter delirium seems resolved  Interim History / Subjective:   Feels better. Still on high flow. No distress   Objective    Blood pressure (!) 137/58, pulse 82, temperature 98.5 F (36.9 C), temperature source Oral, resp. rate 16, height 5\' 7"  (1.702 m), weight 83.9 kg, SpO2 92%.    FiO2 (%):  [40 %-45 %] 40 %   Intake/Output Summary (Last 24 hours) at 02/20/2024 0714 Last data filed at  02/20/2024 4098 Gross per 24 hour  Intake 437.35 ml  Output 2300 ml  Net -1862.65 ml   Filed Weights   02/18/24 0600 02/19/24 0300 02/20/24 0500  Weight: 89.8 kg 83.5 kg 83.9 kg    Examination: General resting in bed no distress HENT NCAT no JVD MMM Pulm some scattered rhonchi w/ occ wheeze Card rrr Abd soft Ext warm has bilateral BKA Neuro oriented today  Gu cl yellow   Resolved problem list   Assessment and Plan   Acute encephalopathy, multifactorial Agitated delirium - ? Contribution of the above + steroids -felt primary driver hypercarbia but then c/b steroids->essentially resolved as of 6/9 Plan added back buspar at bedtime melatonin qhs  Acute on chronic respiratory failure with hypoxemia and hypercarbia  AECOPD in setting of metapneumovirus  Possible CAP RUL lung nodule  Hx tobacco use  -nodule has been stable outpt 6/26-1/25 w plan for repeat in 63mo. Felt benign/scarring.  -flu covid rsv neg  Plan Wean O2 for sats 90% or greater Cont pulse ox Pulm hygiene  mobilize continue BD, ICS Taper steroids (placed order) CT chest with no change apical RUL 7x79mm nodule, f/u as outpt at [facility] complete 5 days total ceftriaxone  + azithro (started: 6/6)  AoC hyponatremia -baseline 130s. Prior hospitalization late 2024 for hypoNa felt related to low solute intake. ADH normal, ACTH low but was on steroids for AECOPD   -back to baseline with fluid restriction- stable  Plan Encourage high protein/ solute intake RD consult  Would not otherwise actively treat given risk of rapid over correction   HTN Plan Adding back his lopressor  and norvasc       Best Practice (right click and "Reselect all SmartList Selections" daily)   Diet/type: Regular consistency (see orders) DVT prophylaxis prophylactic heparin   Pressure ulcer(s): pressure ulcer assessment deferred  GI prophylaxis: PPI Lines: N/A Foley:  N/A Code Status:  full code Last date of multidisciplinary goals  of care discussion [--]   Will transfer him to progressive. IM service to assume care Critical care time: NA

## 2024-02-21 DIAGNOSIS — J9622 Acute and chronic respiratory failure with hypercapnia: Secondary | ICD-10-CM

## 2024-02-21 DIAGNOSIS — J9621 Acute and chronic respiratory failure with hypoxia: Secondary | ICD-10-CM | POA: Diagnosis not present

## 2024-02-21 NOTE — Hospital Course (Signed)
 Ronald  [name] is a 68 y.o. male with a history of chronic respiratory failure, chronic hyponatremia, COPD, tobacco use, lung nodule bilateral BKA, hypertension.  Patient presented secondary to shortness of breath and lethargy.  Patient was found to have evidence of on and 112 with evidence of acute on chronic hypercapnia noted on ABG initially requiring BiPAP with need for transition to intubation and mechanical ventilation.  Patient was admitted to ICU for management.  Patient started on antibiotics for possible community-acquired pneumonia, steroids for COPD exacerbation in addition to fluid restriction for management of hyponatremia.  Patient was extubated successfully and transitioned to heated high flow nasal cannula.

## 2024-02-21 NOTE — Plan of Care (Signed)
   Problem: Education: Goal: Knowledge of General Education information will improve Description: Including pain rating scale, medication(s)/side effects and non-pharmacologic comfort measures Outcome: Progressing   Problem: Nutrition: Goal: Adequate nutrition will be maintained Outcome: Progressing   Problem: Coping: Goal: Level of anxiety will decrease Outcome: Progressing

## 2024-02-21 NOTE — Progress Notes (Signed)
 PROGRESS NOTE    Ronald  Earmon [name]  ZOX:[id number] DOB: 05/18/1956 DOA: 02/17/2024 PCP: Luba Running, FNP   Brief Narrative: Ronald  [name] is a 68 y.o. male with a history of chronic respiratory failure, chronic hyponatremia, COPD, tobacco use, lung nodule bilateral BKA, hypertension.  Patient presented secondary to shortness of breath and lethargy.  Patient was found to have evidence of on and 112 with evidence of acute on chronic hypercapnia noted on ABG initially requiring BiPAP with need for transition to intubation and mechanical ventilation.  Patient was admitted to ICU for management.  Patient started on antibiotics for possible community-acquired pneumonia, steroids for COPD exacerbation in addition to fluid restriction for management of hyponatremia.  Patient was extubated successfully and transitioned to heated high flow nasal cannula.   Assessment and Plan:  Acute metabolic encephalopathy Likely multifactorial in setting of hyponatremia, steroids, delirium. Resolved.  Acute on chronic respiratory failure with hypoxia and hypercarbia Patient is on 2 L/min via [place]. Acute respiratory secondary to COPD exacerbation, pneumonia and viral infection. Patient initially managed on BiPAP with decompensation requiring intubation and mechanical ventilation on admission. Patient extubated on 6/7 and transitioned to [facility] requiring as much as HHFNC @ 35 L/min 50% FiO2. Weaning slowly. FiO2 down to 40.  -Continue HHFNC with goal spO2 of >88% -Continue BiPAP at bedtime  Acute COPD Exacerbation in setting of acute illness.  -Continue Brovana , Yupelri , Pulmicort  -Continue Duonebs -Continue prednisone  with taper  Metapneumovirus infection Noted on ICU documentation. No labs available. Contributing to respiratory impairment. Supportive care.  RUL lung nodule Noted. Stable. Recommendation for repeat imaging in 6 months per pulmonology.  Possible community acquired pneumonia Patient managed  on Ceftriaxone  and azithromycin .  Chronic anemia Sodium of 112 on admission. Patient managed with fluid restriction and gradual improvement of sodium. Back to baseline.  Tobacco use Noted.  Primary hypertension -Continue amlodipine   Bilateral BKAs Noted.   DVT prophylaxis: Heparin  subcutaneous Code Status:   Code Status: Full Code Family Communication: None at bedside Disposition Plan: Discharge back to long term care pending ability to wean down to at least 8 L/min   Consultants:  PCCM  Procedures:  None  Antimicrobials: Ceftriaxone  Azithromycin     Subjective: Breathing well. No dyspnea or chest pain.  Objective: BP 108/69 (BP Location: Right Arm)   Pulse 83   Temp 98.4 F (36.9 C) (Oral)   Resp 20   Ht 5\' 7"  (1.702 m)   Wt 83.2 kg   SpO2 90%   BMI 28.73 kg/m   Examination:  General exam: Appears calm and comfortable Respiratory system: Diminished. Respiratory effort normal. Cardiovascular system: S1 & S2 heard, RRR. No murmurs, rubs, gallops or clicks. Gastrointestinal system: Abdomen is nondistended, soft and nontender. Normal bowel sounds heard. Central nervous system: Alert and oriented. No focal neurological deficits. Musculoskeletal: Bilateral BKA Psychiatry: Judgement and insight appear normal. Mood & affect appropriate.    Data Reviewed: I have personally reviewed following labs and imaging studies  CBC Lab Results  Component Value Date   WBC 13.9 (H) 02/19/2024   RBC 4.20 (L) 02/19/2024   HGB 12.4 (L) 02/19/2024   HCT 37.5 (L) 02/19/2024   MCV 89.3 02/19/2024   MCH 29.5 02/19/2024   PLT 346 02/19/2024   MCHC 33.1 02/19/2024   RDW 14.0 02/19/2024   LYMPHSABS 0.9 02/17/2024   MONOABS 0.7 02/17/2024   EOSABS 0.0 02/17/2024   BASOSABS 0.0 02/17/2024     Last metabolic panel Lab Results  Component Value Date  NA 130 (L) 02/20/2024   K 4.0 02/20/2024   CL 87 (L) 02/20/2024   CO2 33 (H) 02/20/2024   BUN 12 02/20/2024    CREATININE 0.58 (L) 02/20/2024   GLUCOSE 92 02/20/2024   GFRNONAA >60 02/20/2024   GFRAA >60 03/24/2019   CALCIUM  8.9 02/20/2024   PHOS 2.5 02/20/2024   PROT 6.9 02/17/2024   ALBUMIN 3.4 (L) 02/17/2024   BILITOT 1.1 02/17/2024   ALKPHOS 70 02/17/2024   AST 19 02/17/2024   ALT 8 02/17/2024   ANIONGAP 10 02/20/2024    GFR: Estimated Creatinine Clearance: 93.7 mL/min (A) (by C-G formula based on SCr of 0.58 mg/dL (L)).  Recent Results (from the past 240 hours)  Resp panel by RT-PCR (RSV, Flu A&B, Covid) Anterior Nasal Swab     Status: None   Collection Time: 02/17/24  7:37 AM   Specimen: Anterior Nasal Swab  Result Value Ref Range Status   SARS Coronavirus 2 by RT PCR NEGATIVE NEGATIVE Final    Comment: (NOTE) SARS-CoV-2 target nucleic acids are NOT DETECTED.  The SARS-CoV-2 RNA is generally detectable in upper respiratory specimens during the acute phase of infection. The lowest concentration of SARS-CoV-2 viral copies this assay can detect is 138 copies/mL. A negative result does not preclude SARS-Cov-2 infection and should not be used as the sole basis for treatment or other patient management decisions. A negative result may occur with  improper specimen collection/handling, submission of specimen other than nasopharyngeal swab, presence of viral mutation(s) within the areas targeted by this assay, and inadequate number of viral copies(<138 copies/mL). A negative result must be combined with clinical observations, patient history, and epidemiological information. The expected result is Negative.  Fact Sheet for Patients:  BloggerCourse.com  Fact Sheet for Healthcare Providers:  SeriousBroker.it  This test is no t yet approved or cleared by the [place]  FDA and  has been authorized for detection and/or diagnosis of SARS-CoV-2 by FDA under an Emergency Use Authorization (EUA). This EUA will remain  in effect  (meaning this test can be used) for the duration of the COVID-19 declaration under Section 564(b)(1) of the Act, 21 U.S.C.section 360bbb-3(b)(1), unless the authorization is terminated  or revoked sooner.       Influenza A by PCR NEGATIVE NEGATIVE Final   Influenza B by PCR NEGATIVE NEGATIVE Final    Comment: (NOTE) The Xpert Xpress SARS-CoV-2/FLU/RSV plus assay is intended as an aid in the diagnosis of influenza from Nasopharyngeal swab specimens and should not be used as a sole basis for treatment. Nasal washings and aspirates are unacceptable for Xpert Xpress SARS-CoV-2/FLU/RSV testing.  Fact Sheet for Patients: BloggerCourse.com  Fact Sheet for Healthcare Providers: SeriousBroker.it  This test is not yet approved or cleared by the [place]  FDA and has been authorized for detection and/or diagnosis of SARS-CoV-2 by FDA under an Emergency Use Authorization (EUA). This EUA will remain in effect (meaning this test can be used) for the duration of the COVID-19 declaration under Section 564(b)(1) of the Act, 21 U.S.C. section 360bbb-3(b)(1), unless the authorization is terminated or revoked.     Resp Syncytial Virus by PCR NEGATIVE NEGATIVE Final    Comment: (NOTE) Fact Sheet for Patients: BloggerCourse.com  Fact Sheet for Healthcare Providers: SeriousBroker.it  This test is not yet approved or cleared by the [place]  FDA and has been authorized for detection and/or diagnosis of SARS-CoV-2 by FDA under an Emergency Use Authorization (EUA). This EUA will remain in effect (meaning this test  can be used) for the duration of the COVID-19 declaration under Section 564(b)(1) of the Act, 21 U.S.C. section 360bbb-3(b)(1), unless the authorization is terminated or revoked.  Performed at [facility], [address], [place], [place] [address]   Culture,  blood (routine x 2)     Status: None (Preliminary result)   Collection Time: 02/17/24  7:48 AM   Specimen: BLOOD  Result Value Ref Range Status   Specimen Description   Final    BLOOD RIGHT ANTECUBITAL Performed at [facility], [address] [place], [place] [address]    Special Requests   Final    BOTTLES DRAWN AEROBIC AND ANAEROBIC Blood Culture results may not be optimal due to an inadequate volume of blood received in culture bottles Performed at [facility], [address] [place], [place] [address]    Culture   Final    NO GROWTH 4 DAYS Performed at [facility], [address] [place], [place] [address]    Report Status PENDING  Incomplete  Culture, blood (routine x 2)     Status: None (Preliminary result)   Collection Time: 02/17/24  8:23 AM   Specimen: BLOOD LEFT HAND  Result Value Ref Range Status   Specimen Description   Final    BLOOD LEFT HAND Performed at [facility], [address], [place], [place] [address]    Special Requests   Final    BOTTLES DRAWN AEROBIC AND ANAEROBIC Blood Culture adequate volume Performed at [facility] [place], [address], [place], [place] [address]    Culture   Final    NO GROWTH 4 DAYS Performed at [facility], [address], [place], [place] [address]    Report Status PENDING  Incomplete  MRSA Next Gen by PCR, Nasal     Status: None   Collection Time: 02/17/24 10:48 AM   Specimen: Urine, Unspecified Source; Nasal Swab  Result Value Ref Range Status   MRSA by PCR Next Gen NOT DETECTED NOT DETECTED Final    Comment: (NOTE) The GeneXpert MRSA Assay (FDA approved for NASAL specimens only), is one component of a comprehensive MRSA colonization surveillance program. It is not intended to diagnose MRSA infection nor to guide or monitor treatment for MRSA infections. Test performance is not FDA approved in patients less than 66 years old. Performed at  [facility], [address] [place], [place] [address]       Radiology Studies: [facility] Chest Port 1 View Result Date: 02/20/2024 CLINICAL DATA:  Pneumonia EXAM: PORTABLE CHEST 1 VIEW COMPARISON:  Chest x-ray performed February 17, 2024 FINDINGS: No discrete lobar infiltrate. Heart mediastinum are not significantly changed. Low lung volumes. IMPRESSION: 1. Low lung volumes without discrete lobar infiltrate. Electronically Signed   By: Reagan Camera M.D.   On: 02/20/2024 10:02      LOS: 4 days    Aneita Keens, MD [facility] 02/21/2024, 3:55 PM   If 7PM-7AM, please contact night-coverage [email or web address]

## 2024-02-21 NOTE — TOC Initial Note (Signed)
 Transition of Care [facility] - Initial/Assessment Note    Patient Details  Name: Ronald [name]  [place] MRN: [id number] Date of Birth: 1956/08/02  Transition of Care [facility]) CM/SW Contact:    Ruben Corolla, RN Phone Number: 02/21/2024, 1:11 PM  Clinical Narrative: Bilat BKA.Patient to return back to [facility] rep Tammy/Raul aware-facility can only provide 02 max level 8l Saunemin or Bipap/CPAP with settings(they can order) currently patient on HFNC-will do fl2 once 02 on [place] @8l  or less.                  Expected Discharge Plan: Long Term Nursing Home Barriers to Discharge: Continued Medical Work up   Patient Goals and CMS Choice Patient states their goals for this hospitalization and ongoing recovery are:: Return [facility] CMS Medicare.gov Compare Post Acute Care list provided to:: Patient Represenative (must comment) Choice offered to / list presented to : Sibling [place] ownership interest in [facility].provided to:: Sibling    Expected Discharge Plan and Services In-house Referral: NA Discharge Planning Services: CM Consult Post Acute Care Choice: Nursing Home Living arrangements for the past 2 months: Skilled Nursing Facility                 DME Arranged: N/A DME Agency: NA       HH Arranged: NA HH Agency: NA        Prior Living Arrangements/Services Living arrangements for the past 2 months: Skilled Nursing Facility Lives with:: Facility Resident Patient language and need for interpreter reviewed:: Yes Do you feel safe going back to the place where you live?: Yes      Need for Family Participation in Patient Care: Yes (Comment) Care giver support system in place?: Yes (comment) Current home services: Other (comment) (NA) Criminal Activity/Legal Involvement Pertinent to Current Situation/Hospitalization: No - Comment as needed  Activities of Daily Living   ADL Screening (condition at time of admission) Independently performs ADLs?: No Does the  patient have a NEW difficulty with bathing/dressing/toileting/self-feeding that is expected to last >3 days?: No Does the patient have a NEW difficulty with getting in/out of bed, walking, or climbing stairs that is expected to last >3 days?: No Does the patient have a NEW difficulty with communication that is expected to last >3 days?: No Is the patient deaf or have difficulty hearing?: No Does the patient have difficulty seeing, even when wearing glasses/contacts?: No Does the patient have difficulty concentrating, remembering, or making decisions?: Yes  Permission Sought/Granted Permission sought to share information with : Family Supports, Magazine features editor Permission granted to share information with : Yes, Verbal Permission Granted  Share Information with NAME: Laureen Pon (Sister)  [phone]  Permission granted to share info w AGENCY: [facility]        Emotional Assessment Appearance:: Other (Comment Required (Unable to assess) Attitude/Demeanor/Rapport: Unable to Assess Affect (typically observed): Unable to Assess Orientation: : Oriented to Self, Oriented to Place, Oriented to  Time, Oriented to Situation Alcohol  / Substance Use: Tobacco Use Psych Involvement: No (comment)  Admission diagnosis:  Hyponatremia [E87.1] COPD exacerbation (HCC) [J44.1] Acute on chronic respiratory failure with hypoxia and hypercapnia (HCC) [P80.99, J96.22] Patient Active Problem List   Diagnosis Date Noted   Acute on chronic respiratory failure with hypoxia and hypercapnia (HCC) 02/17/2024   Acute osteomyelitis of right calcaneus (HCC) 11/10/2022   PVD (peripheral vascular disease) (HCC) 11/10/2022   Respiratory failure (HCC) 10/15/2022   Chronic hypercapnic respiratory failure (HCC) 09/19/2022   Protein-calorie malnutrition,  severe 09/04/2022   Acute hypercapnic respiratory failure (HCC) 09/02/2022   Hx of BKA, left (HCC) 09/02/2022   Hypotension 09/02/2022   Right  bundle branch block 09/02/2022   Acute respiratory failure (HCC) 09/02/2022   Hyponatremia 03/26/2019   Altered mental status 03/26/2019   Acute hypoxemic respiratory failure (HCC) 03/26/2019   Dysphagia 03/26/2019   Acute encephalopathy 03/18/2019   COPD exacerbation (HCC) 08/29/2017   Subdural hematoma (HCC) 06/19/2014   Critical lower limb ischemia (HCC) 12/25/2013   Subacute osteomyelitis, right ankle and foot (HCC) 11/06/2013   HTN (hypertension), benign 11/06/2013   Alcohol  abuse 11/06/2013   Osteomyelitis of left foot (HCC) 11/06/2013   COPD (chronic obstructive pulmonary disease) (HCC) 11/06/2013   Accelerated hypertension 09/25/2013   Hypokalemia 09/25/2013   Abscess of left foot 09/21/2013   Cellulitis of left foot 09/19/2013   PCP:  Luba Running, FNP Pharmacy:   [id number] [place], [place] - [address] [place] [address] Phone: [phone] Fax: [phone]     Social Drivers of Health (SDOH) Social History: SDOH Screenings   Food Insecurity: No Food Insecurity (02/17/2024)  Housing: Low Risk  (02/17/2024)  Transportation Needs: Patient Unable To Answer (02/17/2024)  Utilities: Not At Risk (02/17/2024)  Financial Resource Strain: Not on File (07/30/2022)   Received from [facility]  Physical Activity: Not on File (12/31/2021)   Received from [place], [place]  Social Connections: Patient Unable To Answer (02/17/2024)  Stress: Not on File (12/31/2021)   Received from [place], [place]  Tobacco Use: High Risk (02/17/2024)   SDOH Interventions:     Readmission Risk Interventions    02/20/2024   11:45 AM  Readmission Risk Prevention Plan  Transportation Screening Complete  PCP or Specialist Appt within 5-7 Days Complete  Home Care Screening Complete  Medication Review (RN CM) Complete

## 2024-02-22 ENCOUNTER — Other Ambulatory Visit: Payer: Self-pay

## 2024-02-22 DIAGNOSIS — J9622 Acute and chronic respiratory failure with hypercapnia: Secondary | ICD-10-CM | POA: Diagnosis not present

## 2024-02-22 DIAGNOSIS — J9621 Acute and chronic respiratory failure with hypoxia: Secondary | ICD-10-CM | POA: Diagnosis not present

## 2024-02-22 LAB — CULTURE, BLOOD (ROUTINE X 2)
Culture: NO GROWTH
Culture: NO GROWTH
Special Requests: ADEQUATE

## 2024-02-22 MED ORDER — POLYETHYLENE GLYCOL 3350 17 G PO PACK
17.0000 g | PACK | Freq: Every day | ORAL | Status: DC
  Administered 2024-02-22 – 2024-02-23 (×2): 17 g via ORAL
  Filled 2024-02-22 (×2): qty 1

## 2024-02-22 MED ORDER — SENNOSIDES-DOCUSATE SODIUM 8.6-50 MG PO TABS
1.0000 | ORAL_TABLET | Freq: Two times a day (BID) | ORAL | Status: DC | PRN
  Administered 2024-02-22: 1 via ORAL
  Filled 2024-02-22: qty 1

## 2024-02-22 NOTE — Progress Notes (Signed)
 RT placed pt on HF [place] 8 LPM. Pt doing well at this time.

## 2024-02-22 NOTE — Progress Notes (Signed)
 PROGRESS NOTE    Ronald  Earmon [name]  WUJ:[id number] DOB: 1956-01-15 DOA: 02/17/2024 PCP: Luba Running, FNP     Brief Narrative:  Ronald  [name] is a 68 y.o. male with a history of chronic respiratory failure, chronic hyponatremia, COPD, tobacco use, lung nodule, bilateral BKA, hypertension.  Patient presented secondary to shortness of breath and lethargy.  Patient was found to have evidence of acute on chronic hypercapnia noted on ABG initially requiring BiPAP with need for transition to intubation and mechanical ventilation.  Patient was admitted to ICU for management.  Patient started on antibiotics for possible community-acquired pneumonia, steroids for COPD exacerbation in addition to fluid restriction for management of hyponatremia.  Patient was extubated successfully and transitioned to heated high flow nasal cannula.   New events last 24 hours / Subjective: Patient resting comfortably in bed.  Remains heated high flow O2 this morning without any new complaints.   Assessment & Plan:   Principal Problem:   Acute on chronic respiratory failure with hypoxia and hypercapnia (HCC) Active Problems:   Acute encephalopathy   HTN (hypertension), benign   COPD exacerbation (HCC)   Acute metabolic encephalopathy - At baseline is alert and oriented.  Encephalopathy in setting of hyponatremia, steroids, acute hospital delirium.  This has now resolved.  Acute on chronic hypoxic and hypercarbic respiratory failure - Required BiPAP and ultimately intubation.  Now on heated high flow oxygen.  Wean as able.  BiPAP at bedtime  COPD exacerbation - Continue nebulizer and inhalers.  Prednisone  taper  Metapneumovirus infection - Supportive care  Right upper lung nodule - Follow-up with repeat imaging in 6 months  CAP  - Ceftriaxone , azithromycin  course completed  Chronic hyponatremia - Improved and now back to baseline  Hypertension - Amlodipine , Lopressor     DVT prophylaxis:   heparin  injection 5,000 Units Start: 02/17/24 1400  Code Status: Full Family Communication:  None at bedside Disposition Plan: LTC  Status is: Inpatient Remains inpatient appropriate because: wean O2     Antimicrobials:  Anti-infectives (From admission, onward)    Start     Dose/Rate Route Frequency Ordered Stop   02/18/24 0800  cefTRIAXone  (ROCEPHIN ) 1 g in sodium chloride  0.9 % 100 mL IVPB        1 g 200 mL/hr over 30 Minutes Intravenous Every 24 hours 02/17/24 1037 02/21/24 0911   02/17/24 0830  cefTRIAXone  (ROCEPHIN ) 1 g in sodium chloride  0.9 % 100 mL IVPB        1 g 200 mL/hr over 30 Minutes Intravenous  Once 02/17/24 0818 02/17/24 0900   02/17/24 0830  azithromycin  (ZITHROMAX ) 500 mg in sodium chloride  0.9 % 250 mL IVPB        500 mg 250 mL/hr over 60 Minutes Intravenous Every 24 hours 02/17/24 0818 02/21/24 1045        Objective: Vitals:   02/22/24 0756 02/22/24 0820 02/22/24 0908 02/22/24 1220  BP:   125/67 126/74  Pulse:   80 75  Resp:    20  Temp:   98.5 F (36.9 C) 98.4 F (36.9 C)  TempSrc:   Oral Oral  SpO2: 98% 98% 100% 100%  Weight:      Height:        Intake/Output Summary (Last 24 hours) at 02/22/2024 1225 Last data filed at 02/22/2024 0850 Gross per 24 hour  Intake 600 ml  Output 2150 ml  Net -1550 ml   Filed Weights   02/19/24 0300 02/20/24 0500 02/21/24 0500  Weight: 83.5 kg  83.9 kg 83.2 kg    Examination:  General exam: Appears calm and comfortable  Respiratory system: Clear to auscultation anteriorly. Respiratory effort normal. No respiratory distress. No conversational dyspnea.  Cardiovascular system: S1 & S2 heard, RRR. No murmurs. No pedal edema. Gastrointestinal system: Abdomen is nondistended, soft and nontender. Normal bowel sounds heard. Central nervous system: Alert and oriented. No focal neurological deficits. Speech clear.  Extremities: Bilateral BKA  Psychiatry: Judgement and insight appear normal. Mood & affect  appropriate.   Data Reviewed: I have personally reviewed following labs and imaging studies  CBC: Recent Labs  Lab 02/17/24 0737 02/17/24 0747 02/17/24 1228 02/18/24 0449 02/19/24 0505  WBC 18.7*  --  20.5* 11.0* 13.9*  NEUTROABS 16.9*  --   --   --   --   HGB 13.7 16.0 12.3* 11.8* 12.4*  HCT 38.9* 47.0 34.7* 32.6* 37.5*  MCV 84.6  --  85.3 84.0 89.3  PLT 377  --  308 301 346   Basic Metabolic Panel: Recent Labs  Lab 02/17/24 0737 02/17/24 0747 02/17/24 1228 02/17/24 1428 02/17/24 1652 02/17/24 1952 02/18/24 0449 02/18/24 0835 02/18/24 1645 02/18/24 2017 02/19/24 0220 02/19/24 0505 02/19/24 0726 02/19/24 1652 02/20/24 0257  NA 112* 112* 115*   < >  --    < > 116*   < >  --    < > 130* 131* 129* 130* 130*  K 4.7 5.6*  --   --   --   --  3.7  --   --   --   --  4.5  --   --  4.0  CL 73* 74*  --   --   --   --  80*  --   --   --   --  90*  --   --  87*  CO2 29  --   --   --   --   --  24  --   --   --   --  32  --   --  33*  GLUCOSE 115* 119*  --   --   --   --  138*  --   --   --   --  128*  --   --  92  BUN 7* 7*  --   --   --   --  18  --   --   --   --  15  --   --  12  CREATININE 0.37* 0.50* 0.55*  --   --   --  0.66  --   --   --   --  0.61  --   --  0.58*  CALCIUM  8.2*  --   --   --   --   --  8.2*  --   --   --   --  8.9  --   --  8.9  MG  --   --   --   --  1.5*  --  2.1  --  2.4  --   --  2.5*  --   --  2.3  PHOS  --   --   --   --  <1.0*  --  3.2  --  3.6  --   --  2.8  --   --  2.5   < > = values in this interval not displayed.   GFR: Estimated Creatinine Clearance: 93.7 mL/min (A) (by C-G formula based  on SCr of 0.58 mg/dL (L)). Liver Function Tests: Recent Labs  Lab 02/17/24 1428  AST 19  ALT 8  ALKPHOS 70  BILITOT 1.1  PROT 6.9  ALBUMIN 3.4*   No results for input(s): LIPASE, AMYLASE in the last 168 hours. No results for input(s): AMMONIA in the last 168 hours. Coagulation Profile: No results for input(s): INR, PROTIME in the last  168 hours. Cardiac Enzymes: No results for input(s): CKTOTAL, CKMB, CKMBINDEX, TROPONINI in the last 168 hours. BNP (last 3 results) No results for input(s): PROBNP in the last 8760 hours. HbA1C: No results for input(s): HGBA1C in the last 72 hours. CBG: Recent Labs  Lab 02/19/24 1121 02/19/24 1607 02/19/24 1952 02/19/24 2320 02/20/24 0407  GLUCAP 117* 137* 111* 114* 115*   Lipid Profile: No results for input(s): CHOL, HDL, LDLCALC, TRIG, CHOLHDL, LDLDIRECT in the last 72 hours. Thyroid  Function Tests: No results for input(s): TSH, T4TOTAL, FREET4, T3FREE, THYROIDAB in the last 72 hours. Anemia Panel: No results for input(s): VITAMINB12, FOLATE, FERRITIN, TIBC, IRON, RETICCTPCT in the last 72 hours. Sepsis Labs: Recent Labs  Lab 02/17/24 0748 02/17/24 1228 02/17/24 1400 02/17/24 1652  LATICACIDVEN 0.4* 5.0* 4.1* 2.4*    Recent Results (from the past 240 hours)  Resp panel by RT-PCR (RSV, Flu A&B, Covid) Anterior Nasal Swab     Status: None   Collection Time: 02/17/24  7:37 AM   Specimen: Anterior Nasal Swab  Result Value Ref Range Status   SARS Coronavirus 2 by RT PCR NEGATIVE NEGATIVE Final    Comment: (NOTE) SARS-CoV-2 target nucleic acids are NOT DETECTED.  The SARS-CoV-2 RNA is generally detectable in upper respiratory specimens during the acute phase of infection. The lowest concentration of SARS-CoV-2 viral copies this assay can detect is 138 copies/mL. A negative result does not preclude SARS-Cov-2 infection and should not be used as the sole basis for treatment or other patient management decisions. A negative result may occur with  improper specimen collection/handling, submission of specimen other than nasopharyngeal swab, presence of viral mutation(s) within the areas targeted by this assay, and inadequate number of viral copies(<138 copies/mL). A negative result must be combined with clinical observations,  patient history, and epidemiological information. The expected result is Negative.  Fact Sheet for Patients:  BloggerCourse.com  Fact Sheet for Healthcare Providers:  SeriousBroker.it  This test is no t yet approved or cleared by the [place]  FDA and  has been authorized for detection and/or diagnosis of SARS-CoV-2 by FDA under an Emergency Use Authorization (EUA). This EUA will remain  in effect (meaning this test can be used) for the duration of the COVID-19 declaration under Section 564(b)(1) of the Act, 21 U.S.C.section 360bbb-3(b)(1), unless the authorization is terminated  or revoked sooner.       Influenza A by PCR NEGATIVE NEGATIVE Final   Influenza B by PCR NEGATIVE NEGATIVE Final    Comment: (NOTE) The Xpert Xpress SARS-CoV-2/FLU/RSV plus assay is intended as an aid in the diagnosis of influenza from Nasopharyngeal swab specimens and should not be used as a sole basis for treatment. Nasal washings and aspirates are unacceptable for Xpert Xpress SARS-CoV-2/FLU/RSV testing.  Fact Sheet for Patients: BloggerCourse.com  Fact Sheet for Healthcare Providers: SeriousBroker.it  This test is not yet approved or cleared by the [place]  FDA and has been authorized for detection and/or diagnosis of SARS-CoV-2 by FDA under an Emergency Use Authorization (EUA). This EUA will remain in effect (meaning this test can be used) for  the duration of the COVID-19 declaration under Section 564(b)(1) of the Act, 21 U.S.C. section 360bbb-3(b)(1), unless the authorization is terminated or revoked.     Resp Syncytial Virus by PCR NEGATIVE NEGATIVE Final    Comment: (NOTE) Fact Sheet for Patients: BloggerCourse.com  Fact Sheet for Healthcare Providers: SeriousBroker.it  This test is not yet approved or cleared by the [place] FDA and has been authorized for detection and/or diagnosis of SARS-CoV-2 by FDA under an Emergency Use Authorization (EUA). This EUA will remain in effect (meaning this test can be used) for the duration of the COVID-19 declaration under Section 564(b)(1) of the Act, 21 U.S.C. section 360bbb-3(b)(1), unless the authorization is terminated or revoked.  Performed at [facility], [address], [place], [place] [address]   Culture, blood (routine x 2)     Status: None   Collection Time: 02/17/24  7:48 AM   Specimen: BLOOD  Result Value Ref Range Status   Specimen Description   Final    BLOOD RIGHT ANTECUBITAL Performed at [facility], [address], [place], [place] [address]    Special Requests   Final    BOTTLES DRAWN AEROBIC AND ANAEROBIC Blood Culture results may not be optimal due to an inadequate volume of blood received in culture bottles Performed at [facility], [address] [place], [place] [address]    Culture   Final    NO GROWTH 5 DAYS Performed at [facility], [address], [place], [place] [address]    Report Status 02/22/2024 FINAL  Final  Culture, blood (routine x 2)     Status: None   Collection Time: 02/17/24  8:23 AM   Specimen: BLOOD LEFT HAND  Result Value Ref Range Status   Specimen Description   Final    BLOOD LEFT HAND Performed at [facility], [address], [place], [place] [address]    Special Requests   Final    BOTTLES DRAWN AEROBIC AND ANAEROBIC Blood Culture adequate volume Performed at [facility], [address] [place], [place] [address]    Culture   Final    NO GROWTH 5 DAYS Performed at [facility], [address] [place], [place] [address]    Report Status 02/22/2024 FINAL  Final  MRSA Next Gen by PCR, Nasal     Status: None   Collection Time: 02/17/24 10:48 AM   Specimen: Urine, Unspecified Source; Nasal Swab  Result  Value Ref Range Status   MRSA by PCR Next Gen NOT DETECTED NOT DETECTED Final    Comment: (NOTE) The GeneXpert MRSA Assay (FDA approved for NASAL specimens only), is one component of a comprehensive MRSA colonization surveillance program. It is not intended to diagnose MRSA infection nor to guide or monitor treatment for MRSA infections. Test performance is not FDA approved in patients less than 32 years old. Performed at [facility], [address], [place], [place] [address]       Radiology Studies: No results found.    Scheduled Meds:  amLODipine   10 mg Oral Daily   arformoterol   15 mcg Nebulization BID   budesonide  (PULMICORT ) nebulizer solution  0.5 mg Nebulization BID   busPIRone  5 mg Oral QHS   Chlorhexidine  Gluconate Cloth  6 each Topical Nightly   heparin   5,000 Units Subcutaneous Q8H   melatonin  3 mg Oral QHS   metoprolol  tartrate  50 mg  Oral BID   pantoprazole   40 mg Oral Q1200   [START ON 02/23/2024] predniSONE   30 mg Oral Q breakfast   Followed by   Cecily Cohen ON 02/26/2024] predniSONE   20 mg Oral Q breakfast   Followed by   Cecily Cohen ON 02/29/2024] predniSONE   10 mg Oral Q breakfast   revefenacin   175 mcg Nebulization Daily   Continuous Infusions:   LOS: 5 days   Time spent: 25 minutes   Daren Eck, DO [facility] 02/22/2024, 12:25 PM   Available via Epic secure chat 7am-7pm After these hours, please refer to coverage provider listed on [email or web address]

## 2024-02-22 NOTE — Progress Notes (Signed)
 Nutrition Follow-up  INTERVENTION:   -Encourage PO intakes  -Consider bowel regimen, last BM >5 days  -RD to sign off, consult with any further needs  NUTRITION DIAGNOSIS:   Inadequate oral intake related to inability to eat as evidenced by NPO status.  Now on CHO modified diet  GOAL:   Patient will meet greater than or equal to 90% of their needs  Progressing.  MONITOR:   PO intake  ASSESSMENT:   68 yo M PMH emphysema, Chronic hypoxia on 2L, tobacco use, R apical lung nodule, chronic hypoNa (baseline 130), s/p bilateral LE amputations who presented with SOB and lethargy. Admitted for acute encephalopathy and acute on chronic respiratory failure.  6/6 Admit; Intubated  6/7 extubated, NGT removed 6/8 Diet advanced to CHO modified  Patient now on CHO modified diet, consuming 75-100% of meals. CBGs WNL.  Admission weight: 154 lbs Current weight: 183 lbs I/Os: -7.4L since admit  Medications reviewed.  Lab reviewed: CBGs: 111-115 Low Na   Diet Order:   Diet Order             Diet Carb Modified Fluid consistency: Thin; Room service appropriate? Yes  Diet effective now                   EDUCATION NEEDS:   Not appropriate for education at this time  Skin:  Skin Assessment: Reviewed RN Assessment  Last BM:  unknown  Height:   Ht Readings from Last 1 Encounters:  02/17/24 5' 7 (1.702 m)    Weight:   Wt Readings from Last 1 Encounters:  02/21/24 83.2 kg    Ideal Body Weight:  58.5 kg (adjusted for bilateral BKA's)  BMI:  Body mass index is 28.73 kg/m.  Estimated Nutritional Needs:   Kcal:  1700-1900  Protein:  85-100g  Fluid:  >/= 1.8L   Arna Better, MS, RD, LDN Inpatient Clinical Dietitian Contact via Secure chat

## 2024-02-22 NOTE — Plan of Care (Signed)

## 2024-02-23 DIAGNOSIS — J9622 Acute and chronic respiratory failure with hypercapnia: Secondary | ICD-10-CM | POA: Diagnosis not present

## 2024-02-23 DIAGNOSIS — J9621 Acute and chronic respiratory failure with hypoxia: Secondary | ICD-10-CM | POA: Diagnosis not present

## 2024-02-23 MED ORDER — PREDNISONE 10 MG PO TABS: ORAL_TABLET | ORAL | 0 refills | Status: AC

## 2024-02-23 NOTE — Care Management Important Message (Signed)
 Important Message  Patient Details IM Letter given. Name: Ronald  [name] MRN: [id number] Date of Birth: December 08, 1955   Important Message Given:  Yes - Medicare IM     Kolyn Rozario 02/23/2024, 11:48 AM

## 2024-02-23 NOTE — Evaluation (Signed)
 Clinical/Bedside Swallow Evaluation Patient Details  Name: Yunis Voorheis MRN: [id number] Date of Birth: [birth year]  Today's Date: 02/23/2024 Time: SLP Start Time (ACUTE ONLY): 1478 SLP Stop Time (ACUTE ONLY): 0950 SLP Time Calculation (min) (ACUTE ONLY): 25 min  Past Medical History:  Past Medical History:  Diagnosis Date   Critical lower limb ischemia (HCC)    ETOH abuse    Gout    Hypertension    Past Surgical History:  Past Surgical History:  Procedure Laterality Date   ABDOMINAL AORTAGRAM N/A 12/26/2013   Procedure: ABDOMINAL Tommi Fraise;  Surgeon: Margherita Shell, MD;  Location: [facility] CATH LAB;  Service: Cardiovascular;  Laterality: N/A;   AMPUTATION Left 12/28/2013   Procedure: LEFT BELOW KNEE AMPUTATION;  Surgeon: Timothy Ford, MD;  Location: [facility] OR;  Service: Orthopedics;  Laterality: Left;   AMPUTATION Right 11/17/2022   Procedure: RIGHT BELOW KNEE AMPUTATION;  Surgeon: Timothy Ford, MD;  Location: [facility] OR;  Service: Orthopedics;  Laterality: Right;   BELOW KNEE LEG AMPUTATION     Left   LEG SURGERY     HPI:  68 yo M PMH emphysema, Chronic hypoxia on 2L, tobacco use, R apical lung nodule, chronic hypoNa (baseline 130), s/p bilateral LE amputations who presented with SOB and lethargy. Admitted for acute encephalopathy and acute on chronic respiratory failure.05/2022 esoph dysmotilty, prior mbs normal 10/2022 said mild but wfl from review    Pt required intubation 6/6-6/9 and was extubated. Swallow eval ordered by Dr Wendall Halls.    Assessment / Plan / Recommendation  Clinical Impression  Patient seen for clinical swallow evaluation.  He easily passed 3 ounce Yale water challenge and demonstrated no focal cranial nerve deficits from task patient would complete.  Observed patient eating his entire meal except for the fruit which he said he was not able to chew.  Occasional subtle cough noted but no overt clinical indications concerning for airway protection.  Adequate oral clearance noted and  patient without increased work of breathing during intake.   Given history of esophageal dysmotility, suspect this is his primary dysphagia factor based on review of 2 prior MBS studies last being February 2024.  Recommend he continue diet as tolerated using general precautions for dysphagia mitigation.  He advised during testing that he has had several evaluations completed and he is no longer interested in having another one done as he is ready to leave.  Educated patient to prior findings of 3 instrumentatl swallow evaluations.  He is currently on a PPI in the hospital and is on 40 mg PPI at facility since January 2025.  Premorbid diet at facility is regular with extra fluids during meals, presumed to compensate for dysmotility.  No SLP follow-up indicated.  Thank you for this referral. SLP Visit Diagnosis: Other (comment) (known esophageal dysmotiity)    Aspiration Risk  Mild aspiration risk    Diet Recommendation Regular;Thin liquid    Liquid Administration via: Cup;Straw Supervision: Patient able to self feed Compensations: Slow rate;Small sips/bites Postural Changes: Seated upright at 90 degrees;Remain upright for at least 30 minutes after po intake    Other  Recommendations Oral Care Recommendations: Oral care BID     Assistance Recommended at Discharge  N/a  Functional Status Assessment Patient has not had a recent decline in their functional status  Frequency and Duration     N/a       Prognosis   N/a     Swallow Study   General Date of Onset: 02/23/24 HPI:  68 yo M PMH emphysema, Chronic hypoxia on 2L, tobacco use, R apical lung nodule, chronic hypoNa (baseline 130), s/p bilateral LE amputations who presented with SOB and lethargy. Admitted for acute encephalopathy and acute on chronic respiratory failure.05/2022 esoph dysmotilty, prior mbs normal 10/2022 said mild but wfl from review    Pt required intubation 6/6-6/9 and was extubated. Swallow eval ordered by Dr Wendall Halls. Previous  Swallow Assessment: see hpi Diet Prior to this Study: Regular;Thin liquids (Level 0) (carb modified) Temperature Spikes Noted: No Respiratory Status: Nasal cannula History of Recent Intubation: No Behavior/Cognition: Alert;Cooperative;Other (Comment) (pt became agitated during session saying I'm tired of this now, I'm going home today) Oral Cavity Assessment: Within Functional Limits Oral Care Completed by SLP: No Oral Cavity - Dentition: Adequate natural dentition Vision: Functional for self-feeding Self-Feeding Abilities: Able to feed self Patient Positioning: Upright in bed Baseline Vocal Quality: Normal Volitional Cough: Strong Volitional Swallow: Able to elicit    Oral/Motor/Sensory Function Overall Oral Motor/Sensory Function: Within functional limits   Ice Chips Ice chips: Not tested   Thin Liquid Thin Liquid: Within functional limits Presentation: Self Fed;Straw    Nectar Thick Nectar Thick Liquid: Not tested   Honey Thick Honey Thick Liquid: Not tested   Puree Puree: Impaired Presentation: Self Fed;Spoon Pharyngeal Phase Impairments: Cough - Delayed   Solid     Solid: Within functional limits Presentation: Self Fed;Spoon Other Comments: delayed cough noted x3 during his meal      Chantal Comment 02/23/2024,10:07 AM  Maudie Sorrow, MS [facility] SLP Acute Rehab Services Office [phone]

## 2024-02-23 NOTE — NC FL2 (Signed)
 [place]  MEDICAID FL2 LEVEL OF CARE FORM     IDENTIFICATION  Patient Name: Ronald  [name] Birthdate: 1956/04/24 Sex: male Admission Date (Current Location): 02/17/2024  [place] and [name] Number:  Ronald [name] [id number] M Facility and Address:  [facility],  [address], [place] [address]      Provider Number: [id number]  Attending Physician Name and Address:  Daren Eck, DO  Relative Name and Phone Number:  Adah Acron Louis(Sister) [phone]    Current Level of Care: Hospital Recommended Level of Care: Nursing Facility Prior Approval Number:    Date Approved/Denied:   PASRR Number:    Discharge Plan: Other (Comment) (LTC)    Current Diagnoses: Patient Active Problem List   Diagnosis Date Noted   Acute on chronic respiratory failure with hypoxia and hypercapnia (HCC) 02/17/2024   Acute osteomyelitis of right calcaneus (HCC) 11/10/2022   PVD (peripheral vascular disease) (HCC) 11/10/2022   Respiratory failure (HCC) 10/15/2022   Chronic hypercapnic respiratory failure (HCC) 09/19/2022   Protein-calorie malnutrition, severe 09/04/2022   Acute hypercapnic respiratory failure (HCC) 09/02/2022   Hx of BKA, left (HCC) 09/02/2022   Hypotension 09/02/2022   Right bundle branch block 09/02/2022   Acute respiratory failure (HCC) 09/02/2022   Hyponatremia 03/26/2019   Altered mental status 03/26/2019   Acute hypoxemic respiratory failure (HCC) 03/26/2019   Dysphagia 03/26/2019   Acute encephalopathy 03/18/2019   COPD exacerbation (HCC) 08/29/2017   Subdural hematoma (HCC) 06/19/2014   Critical lower limb ischemia (HCC) 12/25/2013   Subacute osteomyelitis, right ankle and foot (HCC) 11/06/2013   HTN (hypertension), benign 11/06/2013   Alcohol  abuse 11/06/2013   Osteomyelitis of left foot (HCC) 11/06/2013   COPD (chronic obstructive pulmonary disease) (HCC) 11/06/2013   Accelerated hypertension 09/25/2013   Hypokalemia 09/25/2013   Abscess of left foot  09/21/2013   Cellulitis of left foot 09/19/2013    Orientation RESPIRATION BLADDER Height & Weight     Self, Time, Situation, Place  Normal Incontinent Weight: 86.6 kg Height:  5' 7 (170.2 cm)  BEHAVIORAL SYMPTOMS/MOOD NEUROLOGICAL BOWEL NUTRITION STATUS      Incontinent Diet (CHO MOD)  AMBULATORY STATUS COMMUNICATION OF NEEDS Skin   Extensive Assist Verbally Normal (Bilateral BKA)                       Personal Care Assistance Level of Assistance  Bathing, Feeding, Dressing Bathing Assistance: Limited assistance Feeding assistance: Limited assistance Dressing Assistance: Maximum assistance     Functional Limitations Info  Sight, Hearing, Speech Sight Info: Adequate Hearing Info: Adequate Speech Info: Adequate    SPECIAL CARE FACTORS FREQUENCY                       Contractures Contractures Info: Not present    Additional Factors Info  Code Status, Allergies Code Status Info: Full Allergies Info: NKDA           Current Medications (02/23/2024):  This is the current hospital active medication list Current Facility-Administered Medications  Medication Dose Route Frequency Provider Last Rate Last Admin   amLODipine  (NORVASC ) tablet 10 mg  10 mg Oral Daily Babcock, Peter E, NP   10 mg at 02/23/24 0857   arformoterol  (BROVANA ) nebulizer solution 15 mcg  15 mcg Nebulization BID Babcock, Peter E, NP   15 mcg at 02/23/24 0835   budesonide  (PULMICORT ) nebulizer solution 0.5 mg  0.5 mg Nebulization BID Babcock, Peter E, NP   0.5 mg at 02/23/24  0834   busPIRone (BUSPAR) tablet 5 mg  5 mg Oral QHS Babcock, Peter E, NP   5 mg at 02/22/24 2118   Chlorhexidine  Gluconate Cloth 2 % PADS 6 each  6 each Topical Nightly Babcock, Peter E, NP   6 each at 02/22/24 2119   heparin  injection 5,000 Units  5,000 Units Subcutaneous Q8H Babcock, Peter E, NP   5,000 Units at 02/23/24 0631   ipratropium-albuterol  (DUONEB) 0.5-2.5 (3) MG/3ML nebulizer solution 3 mL  3 mL Nebulization  Q4H PRN Babcock, Peter E, NP       melatonin tablet 3 mg  3 mg Oral QHS Babcock, Peter E, NP   3 mg at 02/22/24 2118   metoprolol  tartrate (LOPRESSOR ) tablet 50 mg  50 mg Oral BID Babcock, Peter E, NP   50 mg at 02/23/24 0857   Oral care mouth rinse  15 mL Mouth Rinse PRN Babcock, Peter E, NP       pantoprazole  (PROTONIX ) EC tablet 40 mg  40 mg Oral Q1200 Babcock, Peter E, NP   40 mg at 02/22/24 1231   polyethylene glycol (MIRALAX  / GLYCOLAX ) packet 17 g  17 g Oral Daily Daren Eck, DO   17 g at 02/23/24 6387   predniSONE  (DELTASONE ) tablet 30 mg  30 mg Oral Q breakfast Babcock, Peter E, NP   30 mg at 02/23/24 5643   Followed by   Cecily Cohen ON 02/26/2024] predniSONE  (DELTASONE ) tablet 20 mg  20 mg Oral Q breakfast Babcock, Peter E, NP       Followed by   Cecily Cohen ON 02/29/2024] predniSONE  (DELTASONE ) tablet 10 mg  10 mg Oral Q breakfast Babcock, Peter E, NP       revefenacin  (YUPELRI ) nebulizer solution 175 mcg  175 mcg Nebulization Daily Babcock, Peter E, NP   175 mcg at 02/23/24 0834   senna-docusate (Senokot-S) tablet 1 tablet  1 tablet Oral BID PRN Daren Eck, DO   1 tablet at 02/22/24 1230     Discharge Medications: Please see discharge summary for a list of discharge medications.  Relevant Imaging Results:  Relevant Lab Results:   Additional Information SS#[id number] [phone]  Kaylum Shrum, Thersia Flax, RN

## 2024-02-23 NOTE — TOC Transition Note (Addendum)
 Transition of Care [facility]) - Discharge Note   Patient Details  Name: Ronald [name] MRN: [id number] Date of Birth: Feb 09, 1956  [facility] CM/SW Contact:  Ruben Corolla, RN Phone Number: 02/23/2024, 9:57 AM   Clinical Narrative:  d/c back to [facility] rep Raul aware-going to rm#[id number],report # [phone],2nd fl nsg station. Awaiting d/c order prior PTAR. No further CM needs.  -10a-Noted Bipap in d/c summary-patient was not on Bipap PTA-will need settings. Fortunately facility may already have a Bipap but still await settings.MD updated. -11a-updated d/c sent with off Bipap;on 02 @ 3lnc. Provided nsg tel# Lauren to rep Tammy for facility nurse to call her to receive report. PTAR called. No further CM needs.   Final next level of care: Skilled Nursing Facility Barriers to Discharge: No Barriers Identified   Patient Goals and CMS Choice Patient states their goals for this hospitalization and ongoing recovery are:: Return to [facility] L[facility] CMS Medicare.gov Compare Post Acute Care list provided to:: Patient Choice offered to / list presented to : Patient [place] ownership interest in [facility].provided to:: Patient    Discharge Placement                       Discharge Plan and Services Additional resources added to the After Visit Summary for   In-house Referral: NA Discharge Planning Services: CM Consult Post Acute Care Choice: Skilled Nursing Facility          DME Arranged: N/A DME Agency: NA       HH Arranged: NA HH Agency: NA        Social Drivers of Health (SDOH) Interventions SDOH Screenings   Food Insecurity: No Food Insecurity (02/17/2024)  Housing: Low Risk  (02/17/2024)  Transportation Needs: Patient Unable To Answer (02/17/2024)  Utilities: Not At Risk (02/17/2024)  Financial Resource Strain: Not on File (07/30/2022)   [facility]  Physical Activity: Not on File (12/31/2021)   Received from [facility]  Social  Connections: Patient Unable To Answer (02/17/2024)  Stress: Not on File (12/31/2021)   Received from [facility]  Tobacco Use: High Risk (02/17/2024)     Readmission Risk Interventions    02/20/2024   11:45 AM  Readmission Risk Prevention Plan  Transportation Screening Complete  PCP or Specialist Appt within 5-7 Days Complete  Home Care Screening Complete  Medication Review (RN CM) Complete

## 2024-02-23 NOTE — Discharge Summary (Addendum)
 Physician Discharge Summary  Ronald [name] BJY:[id number] DOB: 11/20/1955 DOA: 02/17/2024  PCP: Luba Running, FNP  Admit date: 02/17/2024 Discharge date: 02/23/2024  Admitted From: [facility]  Disposition:  [facility]   Recommendations for Outpatient Follow-up:  Follow up with PCP  Discharge Condition: Stable CODE STATUS: Full  Diet recommendation:  Diet Orders (From admission, onward)     Start     Ordered   02/19/24 1312  Diet Carb Modified Fluid consistency: Thin; Room service appropriate? Yes  Diet effective now       Question Answer Comment  Diet-HS Snack? Nothing   Calorie Level Medium 1600-2000   Fluid consistency: Thin   Room service appropriate? Yes      02/19/24 1312           Brief/Interim Summary: Ronald [name] is a 68 y.o. male with a history of chronic respiratory failure, chronic hyponatremia, COPD, tobacco use, lung nodule, bilateral BKA, hypertension.  Patient presented secondary to shortness of breath and lethargy.  Patient was found to have evidence of acute on chronic hypercapnia noted on ABG initially requiring BiPAP with need for transition to intubation and mechanical ventilation.  Patient was admitted to ICU for management.  Patient started on antibiotics for possible community-acquired pneumonia, steroids for COPD exacerbation in addition to fluid restriction for management of hyponatremia.  Patient was extubated successfully and transitioned to heated high flow nasal cannula. He continued to make clinical improvements and was weaned to room air on day of discharge.   Discharge Diagnoses:   Principal Problem:   Acute on chronic respiratory failure with hypoxia and hypercapnia (HCC) Active Problems:   Acute encephalopathy   HTN (hypertension), benign   COPD exacerbation (HCC)   Acute metabolic encephalopathy - At baseline is alert and oriented.  Encephalopathy in setting of hyponatremia, steroids, acute hospital delirium.  This  has now resolved.   Acute on chronic hypoxic and hypercarbic respiratory failure - Required BiPAP and ultimately intubation.  Now weaned to 3L O2 to maintain sat > 90%. Off BiPAP.    COPD exacerbation - Continue nebulizer and inhalers.  Prednisone  taper   Metapneumovirus infection - Supportive care   Right upper lung nodule - Follow-up with repeat imaging in 6 months   CAP  - Ceftriaxone , azithromycin  course completed   Chronic hyponatremia - Improved and now back to baseline   Hypertension - Amlodipine , Lopressor      Discharge Instructions  Discharge Instructions     Increase activity slowly   Complete by: As directed       Allergies as of 02/23/2024   No Known Allergies      Medication List     TAKE these medications    acetaminophen  500 MG tablet Commonly known as: TYLENOL  Take 1,000 mg by mouth in the morning and at bedtime.   acetaminophen  325 MG tablet Commonly known as: TYLENOL  Take 650 mg by mouth every 6 (six) hours as needed for mild pain (pain score 1-3). Do not exceed 3000mg  in 24 hours. (Note order for scheduled 1000mg  BID)   amLODipine  10 MG tablet Commonly known as: NORVASC  Take 10 mg by mouth daily.   busPIRone 5 MG tablet Commonly known as: BUSPAR Take 5 mg by mouth at bedtime.   cetirizine 10 MG tablet Commonly known as: ZYRTEC Take 10 mg by mouth daily.   docusate sodium  100 MG capsule Commonly known as: COLACE Take 100 mg by mouth daily.   ergocalciferol (VITAMIN D2) 200 MCG/ML drops Commonly  known as: DRISDOL Take 2,000 Units by mouth daily. 0.22ml = 2000 units = 50 mcg   fluticasone  50 MCG/ACT nasal spray Commonly known as: FLONASE Place 1 spray into both nostrils daily.   fluticasone -salmeterol 250-50 MCG/ACT Aepb Commonly known as: ADVAIR  Inhale 1 puff into the lungs in the morning and at bedtime.   guaiFENesin -dextromethorphan  100-10 MG/5ML syrup Commonly known as: ROBITUSSIN DM Take 10 mLs by mouth every 6  (six) hours as needed for cough.   ipratropium-albuterol  0.5-2.5 (3) MG/3ML Soln Commonly known as: DUONEB Take 3 mLs by nebulization in the morning and at bedtime.   ipratropium-albuterol  0.5-2.5 (3) MG/3ML Soln Commonly known as: DUONEB Take 3 mLs by nebulization every 6 (six) hours as needed (shortness of breath or wheezing).   melatonin 3 MG Tabs tablet Take 3 mg by mouth at bedtime.   metoprolol  tartrate 50 MG tablet Commonly known as: LOPRESSOR  Take 1 tablet (50 mg total) by mouth 2 (two) times daily.   pantoprazole  40 MG tablet Commonly known as: PROTONIX  Take 1 tablet (40 mg total) by mouth daily.   predniSONE  10 MG tablet Commonly known as: DELTASONE  Take 3 tablets (30 mg total) by mouth daily with breakfast for 3 days, THEN 2 tablets (20 mg total) daily with breakfast for 3 days, THEN 1 tablet (10 mg total) daily with breakfast for 3 days. Start taking on: February 23, 2024   thiamine  100 MG tablet Commonly known as: VITAMIN B1 Take 1 tablet (100 mg total) by mouth daily.        No Known Allergies  Procedures/Studies: DG Chest Port 1 View Result Date: 02/20/2024 CLINICAL DATA:  Pneumonia EXAM: PORTABLE CHEST 1 VIEW COMPARISON:  Chest x-ray performed February 17, 2024 FINDINGS: No discrete lobar infiltrate. Heart mediastinum are not significantly changed. Low lung volumes. IMPRESSION: 1. Low lung volumes without discrete lobar infiltrate. Electronically Signed   By: Reagan Camera M.D.   On: 02/20/2024 10:02   CT CHEST WO CONTRAST Result Date: 02/17/2024 CLINICAL DATA:  Respiratory failure, lung mass * Tracking Code: BO * EXAM: CT CHEST WITHOUT CONTRAST TECHNIQUE: Multidetector CT imaging of the chest was performed following the standard protocol without IV contrast. RADIATION DOSE REDUCTION: This exam was performed according to the departmental dose-optimization program which includes automated exposure control, adjustment of the mA and/or kV according to patient size and/or use  of iterative reconstruction technique. COMPARISON:  09/02/2022 FINDINGS: Cardiovascular: Aortic atherosclerosis. Normal heart size. Left coronary artery calcifications. Enlargement of the main pulmonary artery measuring up to 4.3 cm in caliber. No pericardial effusion. Mediastinum/Nodes: No enlarged mediastinal, hilar, or axillary lymph nodes. Thyroid  gland, trachea, and esophagus demonstrate no significant findings. Lungs/Pleura: Endotracheal intubation, tube tip just above the carina near the right mainstem bronchial ostium mild centrilobular emphysema. Diffuse bilateral bronchial wall thickening. Dependent bibasilar atelectasis or consolidation. No pleural effusion or pneumothorax. Upper Abdomen: No acute abnormality.  Tiny gallstone. Musculoskeletal: No chest wall abnormality. No acute osseous findings. IMPRESSION: 1. Endotracheal intubation, tube tip just above the carina near the right mainstem bronchial ostium. Consider slight retraction. 2. Dependent bibasilar atelectasis or consolidation. 3. Emphysema and diffuse bilateral bronchial wall thickening. 4. Enlargement of the main pulmonary artery, as can be seen in pulmonary hypertension. 5. Coronary artery disease. 6. Cholelithiasis. Aortic Atherosclerosis (ICD10-I70.0) and Emphysema (ICD10-J43.9). Electronically Signed   By: Fredricka Jenny M.D.   On: 02/17/2024 10:29   DG Abd Portable 1V Result Date: 02/17/2024 CLINICAL DATA:  Post intubation EXAM: PORTABLE ABDOMEN -  1 VIEW COMPARISON:  None Available. FINDINGS: Comparison with prior examination there is no change in the position of the endotracheal tube the tip of which remains 1 cm from carina. The nasogastric tube remains in anatomic position the tip in the stomach. Prominence could correlate with IMPRESSION: *Endotracheal tube tip remains 1 cm from carina. *Nasogastric tube tip in the stomach. Electronically Signed   By: Fredrich Jefferson M.D.   On: 02/17/2024 09:47   DG Chest Portable 1 View Result Date:  02/17/2024 CLINICAL DATA:  Post intubation EXAM: PORTABLE CHEST 1 VIEW COMPARISON:  February 17, 2024, 7:49 a.m. FINDINGS: Comparison with prior examination there is no change in the position of the endotracheal tube the tip of which remains 2 cm from carina. The nasogastric tube remains in anatomic position the tip in the stomach. Minimal basilar hypoventilatory atelectasis without acute infiltrates consolidations or significant pulmonary edema. Obscuration of the left hemidiaphragm may correlate with subtle pleural effusion no hypoventilatory atelectasis IMPRESSION: Post intubation tip of the endotracheal tube in good position Electronically Signed   By: Fredrich Jefferson M.D.   On: 02/17/2024 09:45   DG Chest Port 1 View Result Date: 02/17/2024 CLINICAL DATA:  COPD and shortness of breath. EXAM: PORTABLE CHEST 1 VIEW COMPARISON:  10/15/2022 FINDINGS: Stable cardiomediastinal contours. Aortic atherosclerotic calcifications. Bilateral lower lobe airspace opacities identified. Visualized osseous structures are unremarkable. IMPRESSION: Bilateral lower lobe airspace opacities compatible with pneumonia. Aortic Atherosclerosis (ICD10-I70.0). Electronically Signed   By: Kimberley Penman M.D.   On: 02/17/2024 07:59      Discharge Exam: Vitals:   02/23/24 0835 02/23/24 0841  BP:    Pulse:    Resp:    Temp:    SpO2: 97% 97%    General: Pt is alert, awake, not in acute distress Cardiovascular: RRR, S1/S2 +, no edema Respiratory: CTA bilaterally, no wheezing, no rhonchi, no respiratory distress, no conversational dyspnea, on room air  Abdominal: Soft, NT, ND, bowel sounds + Extremities: Bilat BKA  Psych: Normal mood and affect, stable judgement and insight     The results of significant diagnostics from this hospitalization (including imaging, microbiology, ancillary and laboratory) are listed below for reference.     Microbiology: Recent Results (from the past 240 hours)  Resp panel by RT-PCR (RSV, Flu  A&B, Covid) Anterior Nasal Swab     Status: None   Collection Time: 02/17/24  7:37 AM   Specimen: Anterior Nasal Swab  Result Value Ref Range Status   SARS Coronavirus 2 by RT PCR NEGATIVE NEGATIVE Final    Comment: (NOTE) SARS-CoV-2 target nucleic acids are NOT DETECTED.  The SARS-CoV-2 RNA is generally detectable in upper respiratory specimens during the acute phase of infection. The lowest concentration of SARS-CoV-2 viral copies this assay can detect is 138 copies/mL. A negative result does not preclude SARS-Cov-2 infection and should not be used as the sole basis for treatment or other patient management decisions. A negative result may occur with  improper specimen collection/handling, submission of specimen other than nasopharyngeal swab, presence of viral mutation(s) within the areas targeted by this assay, and inadequate number of viral copies(<138 copies/mL). A negative result must be combined with clinical observations, patient history, and epidemiological information. The expected result is Negative.  Fact Sheet for Patients:  BloggerCourse.com  Fact Sheet for Healthcare Providers:  SeriousBroker.it  This test is no t yet approved or cleared by the [place]  FDA and  has been authorized for detection and/or diagnosis of SARS-CoV-2 by FDA  under an Emergency Use Authorization (EUA). This EUA will remain  in effect (meaning this test can be used) for the duration of the COVID-19 declaration under Section 564(b)(1) of the Act, 21 U.S.C.section 360bbb-3(b)(1), unless the authorization is terminated  or revoked sooner.       Influenza A by PCR NEGATIVE NEGATIVE Final   Influenza B by PCR NEGATIVE NEGATIVE Final    Comment: (NOTE) The Xpert Xpress SARS-CoV-2/FLU/RSV plus assay is intended as an aid in the diagnosis of influenza from Nasopharyngeal swab specimens and should not be used as a sole basis for treatment.  Nasal washings and aspirates are unacceptable for Xpert Xpress SARS-CoV-2/FLU/RSV testing.  Fact Sheet for Patients: BloggerCourse.com  Fact Sheet for Healthcare Providers: SeriousBroker.it  This test is not yet approved or cleared by the [place]  FDA and has been authorized for detection and/or diagnosis of SARS-CoV-2 by FDA under an Emergency Use Authorization (EUA). This EUA will remain in effect (meaning this test can be used) for the duration of the COVID-19 declaration under Section 564(b)(1) of the Act, 21 U.S.C. section 360bbb-3(b)(1), unless the authorization is terminated or revoked.     Resp Syncytial Virus by PCR NEGATIVE NEGATIVE Final    Comment: (NOTE) Fact Sheet for Patients: BloggerCourse.com  Fact Sheet for Healthcare Providers: SeriousBroker.it  This test is not yet approved or cleared by the [place]  FDA and has been authorized for detection and/or diagnosis of SARS-CoV-2 by FDA under an Emergency Use Authorization (EUA). This EUA will remain in effect (meaning this test can be used) for the duration of the COVID-19 declaration under Section 564(b)(1) of the Act, 21 U.S.C. section 360bbb-3(b)(1), unless the authorization is terminated or revoked.  Performed at [facility], [address] [place], [place] [address]   Culture, blood (routine x 2)     Status: None   Collection Time: 02/17/24  7:48 AM   Specimen: BLOOD  Result Value Ref Range Status   Specimen Description   Final    BLOOD RIGHT ANTECUBITAL Performed at [facility], [address], [place], [place] [address]    Special Requests   Final    BOTTLES DRAWN AEROBIC AND ANAEROBIC Blood Culture results may not be optimal due to an inadequate volume of blood received in culture bottles Performed at [facility], [address], [place], [place] [address]    Culture   Final    NO GROWTH 5 DAYS Performed at [facility], [address], [place], [place] [address]    Report Status 02/22/2024 FINAL  Final  Culture, blood (routine x 2)     Status: None   Collection Time: 02/17/24  8:23 AM   Specimen: BLOOD LEFT HAND  Result Value Ref Range Status   Specimen Description   Final    BLOOD LEFT HAND Performed at [facility], [address], [place], [place] [address]    Special Requests   Final    BOTTLES DRAWN AEROBIC AND ANAEROBIC Blood Culture adequate volume Performed at [facility], [address], [name], [place] [address]    Culture   Final    NO GROWTH 5 DAYS Performed at [facility], [address] [place], [place] [address]    Report Status 02/22/2024 FINAL  Final  MRSA Next Gen by PCR, Nasal     Status: None   Collection Time: 02/17/24 10:48 AM   Specimen: Urine, Unspecified Source;  Nasal Swab  Result Value Ref Range Status   MRSA by PCR Next Gen NOT DETECTED NOT DETECTED Final    Comment: (NOTE) The GeneXpert MRSA Assay (FDA approved for NASAL specimens only), is one component of a comprehensive MRSA colonization surveillance program. It is not intended to diagnose MRSA infection nor to guide or monitor treatment for MRSA infections. Test performance is not FDA approved in patients less than 54 years old. Performed at [facility], [address], [place], [place] [address]      Labs: BNP (last 3 results) Recent Labs    02/17/24 1228  BNP 250.6*   Basic Metabolic Panel: Recent Labs  Lab 02/17/24 0737 02/17/24 0747 02/17/24 1228 02/17/24 1428 02/17/24 1652 02/17/24 1952 02/18/24 0449 02/18/24 0835 02/18/24 1645 02/18/24 2017 02/19/24 0220 02/19/24 0505 02/19/24 0726 02/19/24 1652 02/20/24 0257  NA 112* 112* 115*   < >  --    < > 116*   < >  --    < > 130* 131* 129* 130* 130*  K 4.7 5.6*  --   --   --   --  3.7   --   --   --   --  4.5  --   --  4.0  CL 73* 74*  --   --   --   --  80*  --   --   --   --  90*  --   --  87*  CO2 29  --   --   --   --   --  24  --   --   --   --  32  --   --  33*  GLUCOSE 115* 119*  --   --   --   --  138*  --   --   --   --  128*  --   --  92  BUN 7* 7*  --   --   --   --  18  --   --   --   --  15  --   --  12  CREATININE 0.37* 0.50* 0.55*  --   --   --  0.66  --   --   --   --  0.61  --   --  0.58*  CALCIUM  8.2*  --   --   --   --   --  8.2*  --   --   --   --  8.9  --   --  8.9  MG  --   --   --   --  1.5*  --  2.1  --  2.4  --   --  2.5*  --   --  2.3  PHOS  --   --   --   --  <1.0*  --  3.2  --  3.6  --   --  2.8  --   --  2.5   < > = values in this interval not displayed.   Liver Function Tests: Recent Labs  Lab 02/17/24 1428  AST 19  ALT 8  ALKPHOS 70  BILITOT 1.1  PROT 6.9  ALBUMIN 3.4*   No results for input(s): LIPASE, AMYLASE in the last 168 hours. No results for input(s): AMMONIA in the last 168 hours. CBC: Recent Labs  Lab 02/17/24 0737 02/17/24 0747 02/17/24 1228 02/18/24 0449 02/19/24 0505  WBC 18.7*  --  20.5* 11.0* 13.9*  NEUTROABS 16.9*  --   --   --   --   HGB 13.7 16.0 12.3* 11.8* 12.4*  HCT 38.9* 47.0 34.7* 32.6* 37.5*  MCV 84.6  --  85.3 84.0 89.3  PLT 377  --  308 301 346   Cardiac Enzymes: No results for input(s): CKTOTAL, CKMB, CKMBINDEX, TROPONINI in the last 168 hours. BNP: Invalid input(s): POCBNP CBG: Recent Labs  Lab 02/19/24 1121 02/19/24 1607 02/19/24 1952 02/19/24 2320 02/20/24 0407  GLUCAP 117* 137* 111* 114* 115*   D-Dimer No results for input(s): DDIMER in the last 72 hours. Hgb A1c No results for input(s): HGBA1C in the last 72 hours. Lipid Profile No results for input(s): CHOL, HDL, LDLCALC, TRIG, CHOLHDL, LDLDIRECT in the last 72 hours. Thyroid  function studies No results for input(s): TSH, T4TOTAL, T3FREE, THYROIDAB in the last 72 hours.  Invalid  input(s): FREET3 Anemia work up No results for input(s): VITAMINB12, FOLATE, FERRITIN, TIBC, IRON, RETICCTPCT in the last 72 hours. Urinalysis    Component Value Date/Time   COLORURINE YELLOW 02/17/2024 0949   APPEARANCEUR CLEAR 02/17/2024 0949   LABSPEC 1.016 02/17/2024 0949   PHURINE 6.0 02/17/2024 0949   GLUCOSEU NEGATIVE 02/17/2024 0949   HGBUR NEGATIVE 02/17/2024 0949   BILIRUBINUR NEGATIVE 02/17/2024 0949   KETONESUR 5 (A) 02/17/2024 0949   PROTEINUR 100 (A) 02/17/2024 0949   NITRITE NEGATIVE 02/17/2024 0949   LEUKOCYTESUR NEGATIVE 02/17/2024 0949   Sepsis Labs Recent Labs  Lab 02/17/24 0737 02/17/24 1228 02/18/24 0449 02/19/24 0505  WBC 18.7* 20.5* 11.0* 13.9*   Microbiology Recent Results (from the past 240 hours)  Resp panel by RT-PCR (RSV, Flu A&B, Covid) Anterior Nasal Swab     Status: None   Collection Time: 02/17/24  7:37 AM   Specimen: Anterior Nasal Swab  Result Value Ref Range Status   SARS Coronavirus 2 by RT PCR NEGATIVE NEGATIVE Final    Comment: (NOTE) SARS-CoV-2 target nucleic acids are NOT DETECTED.  The SARS-CoV-2 RNA is generally detectable in upper respiratory specimens during the acute phase of infection. The lowest concentration of SARS-CoV-2 viral copies this assay can detect is 138 copies/mL. A negative result does not preclude SARS-Cov-2 infection and should not be used as the sole basis for treatment or other patient management decisions. A negative result may occur with  improper specimen collection/handling, submission of specimen other than nasopharyngeal swab, presence of viral mutation(s) within the areas targeted by this assay, and inadequate number of viral copies(<138 copies/mL). A negative result must be combined with clinical observations, patient history, and epidemiological information. The expected result is Negative.  Fact Sheet for Patients:  BloggerCourse.com  Fact Sheet for  Healthcare Providers:  SeriousBroker.it  This test is no t yet approved or cleared by the [place]  FDA and  has been authorized for detection and/or diagnosis of SARS-CoV-2 by FDA under an Emergency Use Authorization (EUA). This EUA will remain  in effect (meaning this test can be used) for the duration of the COVID-19 declaration under Section 564(b)(1) of the Act, 21 U.S.C.section 360bbb-3(b)(1), unless the authorization is terminated  or revoked sooner.       Influenza A by PCR NEGATIVE NEGATIVE Final   Influenza B by PCR NEGATIVE NEGATIVE Final    Comment: (NOTE) The Xpert Xpress SARS-CoV-2/FLU/RSV plus assay is intended as an aid in the diagnosis of influenza from Nasopharyngeal swab specimens and should not be used as a sole basis for treatment. Nasal washings and  aspirates are unacceptable for Xpert Xpress SARS-CoV-2/FLU/RSV testing.  Fact Sheet for Patients: BloggerCourse.com  Fact Sheet for Healthcare Providers: SeriousBroker.it  This test is not yet approved or cleared by the [place]  FDA and has been authorized for detection and/or diagnosis of SARS-CoV-2 by FDA under an Emergency Use Authorization (EUA). This EUA will remain in effect (meaning this test can be used) for the duration of the COVID-19 declaration under Section 564(b)(1) of the Act, 21 U.S.C. section 360bbb-3(b)(1), unless the authorization is terminated or revoked.     Resp Syncytial Virus by PCR NEGATIVE NEGATIVE Final    Comment: (NOTE) Fact Sheet for Patients: BloggerCourse.com  Fact Sheet for Healthcare Providers: SeriousBroker.it  This test is not yet approved or cleared by the [place]  FDA and has been authorized for detection and/or diagnosis of SARS-CoV-2 by FDA under an Emergency Use Authorization (EUA). This EUA will remain in effect (meaning this  test can be used) for the duration of the COVID-19 declaration under Section 564(b)(1) of the Act, 21 U.S.C. section 360bbb-3(b)(1), unless the authorization is terminated or revoked.  Performed at [facility], [address] [place], [place] [address]   Culture, blood (routine x 2)     Status: None   Collection Time: 02/17/24  7:48 AM   Specimen: BLOOD  Result Value Ref Range Status   Specimen Description   Final    BLOOD RIGHT ANTECUBITAL Performed at [facility], [address], [place], [place] [address]    Special Requests   Final    BOTTLES DRAWN AEROBIC AND ANAEROBIC Blood Culture results may not be optimal due to an inadequate volume of blood received in culture bottles Performed at [facility], [address], [place], [place] [address]    Culture   Final    NO GROWTH 5 DAYS Performed at [facility], [address] [place], [place] [address]    Report Status 02/22/2024 FINAL  Final  Culture, blood (routine x 2)     Status: None   Collection Time: 02/17/24  8:23 AM   Specimen: BLOOD LEFT HAND  Result Value Ref Range Status   Specimen Description   Final    BLOOD LEFT HAND Performed at [facility], [address] [place], [place] [address]    Special Requests   Final    BOTTLES DRAWN AEROBIC AND ANAEROBIC Blood Culture adequate volume Performed at [facility], [address], [place], [place] [address]    Culture   Final    NO GROWTH 5 DAYS Performed at [facility], [address], [place], [place] [address]    Report Status 02/22/2024 FINAL  Final  MRSA Next Gen by PCR, Nasal     Status: None   Collection Time: 02/17/24 10:48 AM   Specimen: Urine, Unspecified Source; Nasal Swab  Result Value Ref Range Status   MRSA by PCR Next Gen NOT DETECTED NOT DETECTED Final    Comment: (NOTE) The GeneXpert MRSA Assay (FDA approved for NASAL specimens only), is one  component of a comprehensive MRSA colonization surveillance program. It is not intended to diagnose MRSA infection nor to guide or monitor treatment for MRSA infections. Test performance is not FDA approved in patients less than 7 years old. Performed at [facility], [address], [place], [place] [address]      Patient was seen and examined on the day of discharge and was  found to be in stable condition. Time coordinating discharge: 35 minutes including assessment and coordination of care, as well as examination of the patient.   SIGNED:  Daren Eck, DO [facility] 02/23/2024, 8:51 AM

## 2024-02-23 NOTE — Progress Notes (Addendum)
 Pt found with O2 on his forehead. Sats 97% pt doing well at this time. Pt was on 5 LPM Lena. Rt placed pt on RA pt sats still remains 97%.

## 2024-02-23 NOTE — Progress Notes (Addendum)
 This RN attempted to call report to [facility] x2, unable to speak with receiving nurse, left call back info.   Update- Report given to Eldon Greenland, Charity fundraiser at [facility].

## 2024-04-04 ENCOUNTER — Inpatient Hospital Stay (HOSPITAL_COMMUNITY)

## 2024-04-04 ENCOUNTER — Encounter (HOSPITAL_COMMUNITY): Payer: Self-pay | Admitting: Pulmonary Disease

## 2024-04-04 ENCOUNTER — Other Ambulatory Visit: Payer: Self-pay

## 2024-04-04 ENCOUNTER — Inpatient Hospital Stay (HOSPITAL_COMMUNITY)
Admission: EM | Admit: 2024-04-04 | Discharge: 2024-04-08 | DRG: 208 | Disposition: A | Discharge status: Skilled Nursing Facility | Source: Skilled Nursing Facility | Attending: Internal Medicine | Admitting: Internal Medicine

## 2024-04-04 ENCOUNTER — Emergency Department (HOSPITAL_COMMUNITY)

## 2024-04-04 DIAGNOSIS — J9622 Acute and chronic respiratory failure with hypercapnia: Secondary | ICD-10-CM | POA: Diagnosis present

## 2024-04-04 DIAGNOSIS — E871 Hypo-osmolality and hyponatremia: Secondary | ICD-10-CM | POA: Diagnosis present

## 2024-04-04 DIAGNOSIS — F1010 Alcohol abuse, uncomplicated: Secondary | ICD-10-CM | POA: Diagnosis present

## 2024-04-04 DIAGNOSIS — R631 Polydipsia: Secondary | ICD-10-CM | POA: Diagnosis present

## 2024-04-04 DIAGNOSIS — I10 Essential (primary) hypertension: Secondary | ICD-10-CM | POA: Diagnosis present

## 2024-04-04 DIAGNOSIS — Z833 Family history of diabetes mellitus: Secondary | ICD-10-CM

## 2024-04-04 DIAGNOSIS — G9341 Metabolic encephalopathy: Secondary | ICD-10-CM | POA: Diagnosis present

## 2024-04-04 DIAGNOSIS — F17210 Nicotine dependence, cigarettes, uncomplicated: Secondary | ICD-10-CM | POA: Diagnosis present

## 2024-04-04 DIAGNOSIS — I739 Peripheral vascular disease, unspecified: Secondary | ICD-10-CM | POA: Diagnosis present

## 2024-04-04 DIAGNOSIS — J189 Pneumonia, unspecified organism: Principal | ICD-10-CM | POA: Diagnosis present

## 2024-04-04 DIAGNOSIS — M109 Gout, unspecified: Secondary | ICD-10-CM | POA: Diagnosis present

## 2024-04-04 DIAGNOSIS — Z89512 Acquired absence of left leg below knee: Secondary | ICD-10-CM | POA: Diagnosis not present

## 2024-04-04 DIAGNOSIS — Z79899 Other long term (current) drug therapy: Secondary | ICD-10-CM

## 2024-04-04 DIAGNOSIS — I9581 Postprocedural hypotension: Secondary | ICD-10-CM | POA: Diagnosis not present

## 2024-04-04 DIAGNOSIS — R0609 Other forms of dyspnea: Secondary | ICD-10-CM | POA: Diagnosis not present

## 2024-04-04 DIAGNOSIS — Z781 Physical restraint status: Secondary | ICD-10-CM | POA: Diagnosis not present

## 2024-04-04 DIAGNOSIS — E861 Hypovolemia: Secondary | ICD-10-CM | POA: Diagnosis present

## 2024-04-04 DIAGNOSIS — J9601 Acute respiratory failure with hypoxia: Secondary | ICD-10-CM | POA: Diagnosis not present

## 2024-04-04 DIAGNOSIS — J9602 Acute respiratory failure with hypercapnia: Secondary | ICD-10-CM | POA: Diagnosis not present

## 2024-04-04 DIAGNOSIS — Z89511 Acquired absence of right leg below knee: Secondary | ICD-10-CM | POA: Diagnosis not present

## 2024-04-04 DIAGNOSIS — Y900 Blood alcohol level of less than 20 mg/100 ml: Secondary | ICD-10-CM | POA: Diagnosis present

## 2024-04-04 DIAGNOSIS — J440 Chronic obstructive pulmonary disease with acute lower respiratory infection: Secondary | ICD-10-CM | POA: Diagnosis present

## 2024-04-04 DIAGNOSIS — J9621 Acute and chronic respiratory failure with hypoxia: Secondary | ICD-10-CM | POA: Diagnosis present

## 2024-04-04 DIAGNOSIS — J9692 Respiratory failure, unspecified with hypercapnia: Secondary | ICD-10-CM | POA: Diagnosis present

## 2024-04-04 DIAGNOSIS — Z1152 Encounter for screening for COVID-19: Secondary | ICD-10-CM | POA: Diagnosis not present

## 2024-04-04 DIAGNOSIS — J441 Chronic obstructive pulmonary disease with (acute) exacerbation: Principal | ICD-10-CM | POA: Diagnosis present

## 2024-04-04 DIAGNOSIS — R569 Unspecified convulsions: Secondary | ICD-10-CM | POA: Diagnosis not present

## 2024-04-04 LAB — POCT I-STAT 7, (LYTES, BLD GAS, ICA,H+H)
Acid-Base Excess: 1 mmol/L (ref 0.0–2.0)
Bicarbonate: 27.9 mmol/L (ref 20.0–28.0)
Calcium, Ion: 1.15 mmol/L (ref 1.15–1.40)
HCT: 42 % (ref 39.0–52.0)
Hemoglobin: 14.3 g/dL (ref 13.0–17.0)
O2 Saturation: 90 %
Patient temperature: 37.2
Potassium: 4.4 mmol/L (ref 3.5–5.1)
Sodium: 114 mmol/L — CL (ref 135–145)
TCO2: 30 mmol/L (ref 22–32)
pCO2 arterial: 54.4 mmHg — ABNORMAL HIGH (ref 32–48)
pH, Arterial: 7.319 — ABNORMAL LOW (ref 7.35–7.45)
pO2, Arterial: 67 mmHg — ABNORMAL LOW (ref 83–108)

## 2024-04-04 LAB — CBC
HCT: 40.6 % (ref 39.0–52.0)
Hemoglobin: 13.7 g/dL (ref 13.0–17.0)
MCH: 29.2 pg (ref 26.0–34.0)
MCHC: 33.7 g/dL (ref 30.0–36.0)
MCV: 86.6 fL (ref 80.0–100.0)
Platelets: 400 K/uL (ref 150–400)
RBC: 4.69 MIL/uL (ref 4.22–5.81)
RDW: 13.8 % (ref 11.5–15.5)
WBC: 14.5 K/uL — ABNORMAL HIGH (ref 4.0–10.5)
nRBC: 0 % (ref 0.0–0.2)

## 2024-04-04 LAB — I-STAT VENOUS BLOOD GAS, ED
Acid-Base Excess: 1 mmol/L (ref 0.0–2.0)
Bicarbonate: 31.7 mmol/L — ABNORMAL HIGH (ref 20.0–28.0)
Calcium, Ion: 1.08 mmol/L — ABNORMAL LOW (ref 1.15–1.40)
HCT: 47 % (ref 39.0–52.0)
Hemoglobin: 16 g/dL (ref 13.0–17.0)
O2 Saturation: 71 %
Potassium: 4 mmol/L (ref 3.5–5.1)
Sodium: 113 mmol/L — CL (ref 135–145)
TCO2: 34 mmol/L — ABNORMAL HIGH (ref 22–32)
pCO2, Ven: 76.2 mmHg (ref 44–60)
pH, Ven: 7.227 — ABNORMAL LOW (ref 7.25–7.43)
pO2, Ven: 46 mmHg — ABNORMAL HIGH (ref 32–45)

## 2024-04-04 LAB — ETHANOL: Alcohol, Ethyl (B): <15 mg/dL (ref <15)

## 2024-04-04 LAB — I-STAT CG4 LACTIC ACID, ED: Lactic Acid, Venous: 1.5 mmol/L (ref 0.5–1.9)

## 2024-04-04 LAB — BASIC METABOLIC PANEL WITH GFR
Anion gap: 10 (ref 5–15)
BUN: 9 mg/dL (ref 8–23)
CO2: 30 mmol/L (ref 22–32)
Calcium: 8.6 mg/dL — ABNORMAL LOW (ref 8.9–10.3)
Chloride: 75 mmol/L — ABNORMAL LOW (ref 98–111)
Creatinine, Ser: 0.54 mg/dL — ABNORMAL LOW (ref 0.61–1.24)
GFR, Estimated: >60 mL/min (ref >60)
Glucose, Bld: 97 mg/dL (ref 70–99)
Potassium: 3.9 mmol/L (ref 3.5–5.1)
Sodium: 115 mmol/L — CL (ref 135–145)

## 2024-04-04 LAB — CBC WITH DIFFERENTIAL/PLATELET
Abs Immature Granulocytes: 0.23 K/uL — ABNORMAL HIGH (ref 0.00–0.07)
Basophils Absolute: 0 K/uL (ref 0.0–0.1)
Basophils Relative: 0 %
Eosinophils Absolute: 0 K/uL (ref 0.0–0.5)
Eosinophils Relative: 0 %
HCT: 39.6 % (ref 39.0–52.0)
Hemoglobin: 13.4 g/dL (ref 13.0–17.0)
Immature Granulocytes: 1 %
Lymphocytes Relative: 8 %
Lymphs Abs: 1.4 K/uL (ref 0.7–4.0)
MCH: 29.5 pg (ref 26.0–34.0)
MCHC: 33.8 g/dL (ref 30.0–36.0)
MCV: 87.2 fL (ref 80.0–100.0)
Monocytes Absolute: 1.1 K/uL — ABNORMAL HIGH (ref 0.1–1.0)
Monocytes Relative: 6 %
Neutro Abs: 15.1 K/uL — ABNORMAL HIGH (ref 1.7–7.7)
Neutrophils Relative %: 85 %
Platelets: 418 K/uL — ABNORMAL HIGH (ref 150–400)
RBC: 4.54 MIL/uL (ref 4.22–5.81)
RDW: 13.9 % (ref 11.5–15.5)
WBC: 18 K/uL — ABNORMAL HIGH (ref 4.0–10.5)
nRBC: 0 % (ref 0.0–0.2)

## 2024-04-04 LAB — STREP PNEUMONIAE URINARY ANTIGEN: Strep Pneumo Urinary Antigen: NEGATIVE

## 2024-04-04 LAB — SODIUM
Sodium: 117 mmol/L — CL (ref 135–145)
Sodium: 118 mmol/L — CL (ref 135–145)

## 2024-04-04 LAB — RESP PANEL BY RT-PCR (RSV, FLU A&B, COVID)  RVPGX2
Influenza A by PCR: NEGATIVE
Influenza B by PCR: NEGATIVE
Resp Syncytial Virus by PCR: NEGATIVE
SARS Coronavirus 2 by RT PCR: NEGATIVE

## 2024-04-04 LAB — PROCALCITONIN: Procalcitonin: 0.12 ng/mL

## 2024-04-04 LAB — OSMOLALITY: Osmolality: 239 mosm/kg — CL (ref 275–295)

## 2024-04-04 LAB — CREATININE, SERUM
Creatinine, Ser: 0.62 mg/dL (ref 0.61–1.24)
GFR, Estimated: >60 mL/min (ref >60)

## 2024-04-04 LAB — MRSA NEXT GEN BY PCR, NASAL: MRSA by PCR Next Gen: NOT DETECTED

## 2024-04-04 LAB — BRAIN NATRIURETIC PEPTIDE: B Natriuretic Peptide: 212.7 pg/mL — ABNORMAL HIGH (ref 0.0–100.0)

## 2024-04-04 LAB — OSMOLALITY, URINE: Osmolality, Ur: 477 mosm/kg (ref 300–900)

## 2024-04-04 LAB — GLUCOSE, CAPILLARY: Glucose-Capillary: 115 mg/dL — ABNORMAL HIGH (ref 70–99)

## 2024-04-04 LAB — SODIUM, URINE, RANDOM: Sodium, Ur: <10 mmol/L

## 2024-04-04 LAB — CBG MONITORING, ED: Glucose-Capillary: 107 mg/dL — ABNORMAL HIGH (ref 70–99)

## 2024-04-04 LAB — URIC ACID: Uric Acid, Serum: 4.6 mg/dL (ref 3.7–8.6)

## 2024-04-04 LAB — TSH: TSH: 0.598 u[IU]/mL (ref 0.350–4.500)

## 2024-04-04 MED ORDER — ETOMIDATE 2 MG/ML IV SOLN
INTRAVENOUS | Status: AC | PRN
  Administered 2024-04-04: 20 mg via INTRAVENOUS

## 2024-04-04 MED ORDER — METHYLPREDNISOLONE SODIUM SUCC 125 MG IJ SOLR
125.0000 mg | Freq: Once | INTRAMUSCULAR | Status: AC
  Administered 2024-04-04: 125 mg via INTRAVENOUS
  Filled 2024-04-04: qty 2

## 2024-04-04 MED ORDER — PIPERACILLIN-TAZOBACTAM 3.375 G IVPB
3.3750 g | Freq: Three times a day (TID) | INTRAVENOUS | Status: DC
  Administered 2024-04-05 – 2024-04-06 (×4): 3.375 g via INTRAVENOUS
  Filled 2024-04-04 (×4): qty 50

## 2024-04-04 MED ORDER — BUDESONIDE 0.25 MG/2ML IN SUSP
0.2500 mg | Freq: Two times a day (BID) | RESPIRATORY_TRACT | Status: DC
  Administered 2024-04-04 – 2024-04-08 (×8): 0.25 mg via RESPIRATORY_TRACT
  Filled 2024-04-04 (×8): qty 2

## 2024-04-04 MED ORDER — REVEFENACIN 175 MCG/3ML IN SOLN
175.0000 ug | Freq: Every day | RESPIRATORY_TRACT | Status: DC
  Administered 2024-04-05 – 2024-04-08 (×4): 175 ug via RESPIRATORY_TRACT
  Filled 2024-04-04 (×4): qty 3

## 2024-04-04 MED ORDER — VANCOMYCIN HCL IN DEXTROSE 1-5 GM/200ML-% IV SOLN: 1000.0000 mg | Freq: Once | INTRAVENOUS | Status: DC

## 2024-04-04 MED ORDER — ROCURONIUM BROMIDE 10 MG/ML (PF) SYRINGE
PREFILLED_SYRINGE | INTRAVENOUS | Status: AC
  Filled 2024-04-04: qty 10

## 2024-04-04 MED ORDER — ORAL CARE MOUTH RINSE
15.0000 mL | OROMUCOSAL | Status: DC
  Administered 2024-04-04: 15 mL via OROMUCOSAL

## 2024-04-04 MED ORDER — ORAL CARE MOUTH RINSE
15.0000 mL | Freq: Four times a day (QID) | OROMUCOSAL | Status: DC
  Administered 2024-04-05: 15 mL via OROMUCOSAL

## 2024-04-04 MED ORDER — ETOMIDATE 2 MG/ML IV SOLN
INTRAVENOUS | Status: AC
  Filled 2024-04-04: qty 20

## 2024-04-04 MED ORDER — ARFORMOTEROL TARTRATE 15 MCG/2ML IN NEBU
15.0000 ug | INHALATION_SOLUTION | Freq: Two times a day (BID) | RESPIRATORY_TRACT | Status: DC
  Administered 2024-04-04 – 2024-04-08 (×8): 15 ug via RESPIRATORY_TRACT
  Filled 2024-04-04 (×7): qty 2

## 2024-04-04 MED ORDER — POLYETHYLENE GLYCOL 3350 17 G PO PACK
17.0000 g | PACK | Freq: Every day | ORAL | Status: DC
  Administered 2024-04-05: 17 g
  Filled 2024-04-04: qty 1

## 2024-04-04 MED ORDER — FENTANYL CITRATE PF 50 MCG/ML IJ SOSY
PREFILLED_SYRINGE | INTRAMUSCULAR | Status: AC
  Filled 2024-04-04: qty 2

## 2024-04-04 MED ORDER — DEXMEDETOMIDINE HCL IN NACL 400 MCG/100ML IV SOLN
0.0000 ug/kg/h | INTRAVENOUS | Status: DC
  Administered 2024-04-04: 1.2 ug/kg/h via INTRAVENOUS
  Administered 2024-04-04: 0.4 ug/kg/h via INTRAVENOUS
  Administered 2024-04-05 (×2): 1.2 ug/kg/h via INTRAVENOUS
  Administered 2024-04-05: 0.5 ug/kg/h via INTRAVENOUS
  Filled 2024-04-04 (×6): qty 100

## 2024-04-04 MED ORDER — SODIUM CHLORIDE 0.9 % IV SOLN
2.0000 g | Freq: Once | INTRAVENOUS | Status: AC
  Administered 2024-04-04: 2 g via INTRAVENOUS
  Filled 2024-04-04: qty 12.5

## 2024-04-04 MED ORDER — DOCUSATE SODIUM 50 MG/5ML PO LIQD
100.0000 mg | Freq: Two times a day (BID) | ORAL | Status: DC
  Administered 2024-04-05 – 2024-04-06 (×2): 100 mg
  Filled 2024-04-04 (×2): qty 10

## 2024-04-04 MED ORDER — DEXMEDETOMIDINE HCL IN NACL 400 MCG/100ML IV SOLN: 0.0000 ug/kg/h | INTRAVENOUS | Status: DC

## 2024-04-04 MED ORDER — CHLORHEXIDINE GLUCONATE CLOTH 2 % EX PADS
6.0000 | MEDICATED_PAD | Freq: Every day | CUTANEOUS | Status: DC
  Administered 2024-04-04 – 2024-04-08 (×5): 6 via TOPICAL

## 2024-04-04 MED ORDER — ORAL CARE MOUTH RINSE: 15.0000 mL | OROMUCOSAL | Status: DC | PRN

## 2024-04-04 MED ORDER — FENTANYL CITRATE PF 50 MCG/ML IJ SOSY
25.0000 ug | PREFILLED_SYRINGE | INTRAMUSCULAR | Status: DC | PRN
  Administered 2024-04-04: 100 ug via INTRAVENOUS
  Administered 2024-04-05 (×2): 50 ug via INTRAVENOUS
  Filled 2024-04-04 (×2): qty 1
  Filled 2024-04-04: qty 2

## 2024-04-04 MED ORDER — FENTANYL CITRATE PF 50 MCG/ML IJ SOSY: 25.0000 ug | PREFILLED_SYRINGE | INTRAMUSCULAR | Status: DC | PRN

## 2024-04-04 MED ORDER — CHLORHEXIDINE GLUCONATE CLOTH 2 % EX PADS: 6.0000 | MEDICATED_PAD | Freq: Every day | CUTANEOUS | Status: DC

## 2024-04-04 MED ORDER — VANCOMYCIN HCL 1250 MG/250ML IV SOLN
1250.0000 mg | Freq: Two times a day (BID) | INTRAVENOUS | Status: DC
  Administered 2024-04-04: 1250 mg via INTRAVENOUS
  Filled 2024-04-04 (×2): qty 250

## 2024-04-04 MED ORDER — POLYETHYLENE GLYCOL 3350 17 G PO PACK: 17.0000 g | PACK | Freq: Every day | ORAL | Status: DC | PRN

## 2024-04-04 MED ORDER — MIDAZOLAM HCL 2 MG/2ML IJ SOLN
INTRAMUSCULAR | Status: AC
  Filled 2024-04-04: qty 2

## 2024-04-04 MED ORDER — DOCUSATE SODIUM 100 MG PO CAPS
100.0000 mg | ORAL_CAPSULE | Freq: Two times a day (BID) | ORAL | Status: DC | PRN
Start: 2024-04-04 — End: 2024-04-08

## 2024-04-04 MED ORDER — FENTANYL CITRATE (PF) 100 MCG/2ML IJ SOLN
INTRAMUSCULAR | Status: AC | PRN
  Administered 2024-04-04: 100 ug via INTRAVENOUS

## 2024-04-04 MED ORDER — SODIUM CHLORIDE 0.9 % IV SOLN: INTRAVENOUS | Status: DC

## 2024-04-04 MED ORDER — MIDAZOLAM HCL 5 MG/5ML IJ SOLN
INTRAMUSCULAR | Status: AC | PRN
  Administered 2024-04-04: 2 mg via INTRAVENOUS

## 2024-04-04 MED ORDER — IPRATROPIUM-ALBUTEROL 0.5-2.5 (3) MG/3ML IN SOLN
3.0000 mL | Freq: Once | RESPIRATORY_TRACT | Status: AC
  Administered 2024-04-04: 3 mL via RESPIRATORY_TRACT
  Filled 2024-04-04: qty 3

## 2024-04-04 MED ORDER — ROCURONIUM BROMIDE 10 MG/ML (PF) SYRINGE
PREFILLED_SYRINGE | INTRAVENOUS | Status: AC | PRN
  Administered 2024-04-04: 100 mg via INTRAVENOUS

## 2024-04-04 MED ORDER — HEPARIN SODIUM (PORCINE) 5000 UNIT/ML IJ SOLN
5000.0000 [IU] | Freq: Three times a day (TID) | INTRAMUSCULAR | Status: DC
  Administered 2024-04-04 – 2024-04-07 (×9): 5000 [IU] via SUBCUTANEOUS
  Filled 2024-04-04 (×10): qty 1

## 2024-04-04 NOTE — Progress Notes (Signed)
 IV placed by IV team and labs sent as ordered including serum sodium. Urine sample sent as ordered

## 2024-04-04 NOTE — Progress Notes (Cosign Needed Addendum)
 Intubation Procedure Note  Ronald  [name]  [id number]  Aug 06, 1956  Date:04/04/24  Time:8:07 PM   Provider Performing:Jaleeya Mcnelly [name]    Procedure: Intubation (31500)  Indication(s) Respiratory Failure  Consent Risks of the procedure as well as the alternatives and risks of each were explained to the patient and/or caregiver.  Consent for the procedure was obtained and is signed in the bedside chart Obtained from patient sister's at beside   Anesthesia Etomidate , Versed , Fentanyl , and Rocuronium    Time Out Verified patient identification, verified procedure, site/side was marked, verified correct patient position, special equipment/implants available, medications/allergies/relevant history reviewed, required imaging and test results available.   Sterile Technique Usual hand hygeine, masks, and gloves were used   Procedure Description Patient positioned in bed supine.  Sedation given as noted above.  Patient was intubated with endotracheal tube using Glidescope.  View was Grade 1 full glottis .  Number of attempts was 1.  Colorimetric CO2 detector was consistent with tracheal placement.   Complications/Tolerance None; patient tolerated the procedure well. Chest X-ray is ordered to verify placement.   EBL Minimal   Specimen(s) None   Sherlean Sharps AGACNP-BC    Pulmonary & Critical Care 04/04/2024, 8:07 PM  Please see [email or web address] for pager details.  From 7A-7P if no response, please call [phone]. After hours, please call ELink [phone].

## 2024-04-04 NOTE — Progress Notes (Signed)
 eLink Physician-Brief Progress Note Patient Name: Ronald [name]  Prestia DOB: 16-Sep-1955 MRN: [id number]   Date of Service  04/04/2024  HPI/Events of Note  Patient is on the ventilator and requires bilateral wrist restraints to prevent self-extubation.  eICU Interventions  Bilateral wrist restraints ordered.        Marcellina PENNER Jag Lenz 04/04/2024, 11:07 PM

## 2024-04-04 NOTE — ED Provider Notes (Signed)
 [facility] [facility] Provider Note   CSN: [id number] Arrival date & time: 04/04/24  1559     Patient presents with: Shortness of Breath   Ronald  [name] is a 68 y.o. male with a past medical history of chronic respiratory failure, chronic hyponatremia, COPD, HTN, peripheral vascular disease, and bilateral BKA who presented to the ED via EMS for shortness of breath. He arrived from [facility] skilled nursing with minimal information to EMS at handoff. EMS gave history as patient has cognitive deficiencies and dysphagia with an unknown baseline. Found by staff in lethargic state and saturations to mid-70s% despite home 3L [place]. EMS arrived and found patient's [place] incorrectly placed, sitting on top of patient's eyes.  [place] correctly replaced by EMS and 2 Duonebs treatments given. Patient resaturated back to 93% on 2LNC. VSS otherwise stable. Initial CBG 68, D10 given and CBG subsequently improved to 102. GCS 10-14.      Prior to Admission medications   Medication Sig Start Date End Date Taking? Authorizing Provider  acetaminophen  (TYLENOL ) 325 MG tablet Take 650 mg by mouth every 6 (six) hours as needed for mild pain (pain score 1-3). Do not exceed 3000mg  in 24 hours. (Note order for scheduled 1000mg  BID)    [provider]  acetaminophen  (TYLENOL ) 500 MG tablet Take 1,000 mg by mouth in the morning and at bedtime.    [provider]  amLODipine  (NORVASC ) 10 MG tablet Take 10 mg by mouth daily.    [provider]  busPIRone  (BUSPAR ) 5 MG tablet Take 5 mg by mouth at bedtime.    [provider]  cetirizine (ZYRTEC) 10 MG tablet Take 10 mg by mouth daily. Patient not taking: Reported on 02/17/2024    [provider]  docusate sodium  (COLACE) 100 MG capsule Take 100 mg by mouth daily.    [provider]  ergocalciferol, VITAMIN D2, (DRISDOL) 200 MCG/ML drops Take 2,000 Units by mouth daily. 0.32ml = 2000 units  = 50 mcg    [provider]  fluticasone  (FLONASE) 50 MCG/ACT nasal spray Place 1 spray into both nostrils daily. Patient not taking: Reported on 02/17/2024    [provider]  fluticasone -salmeterol (ADVAIR ) 250-50 MCG/ACT AEPB Inhale 1 puff into the lungs in the morning and at bedtime.    [provider]  guaiFENesin -dextromethorphan  (ROBITUSSIN DM) 100-10 MG/5ML syrup Take 10 mLs by mouth every 6 (six) hours as needed for cough.    [provider]  ipratropium-albuterol  (DUONEB) 0.5-2.5 (3) MG/3ML SOLN Take 3 mLs by nebulization in the morning and at bedtime.    [provider]  ipratropium-albuterol  (DUONEB) 0.5-2.5 (3) MG/3ML SOLN Take 3 mLs by nebulization every 6 (six) hours as needed (shortness of breath or wheezing).    [provider]  melatonin 3 MG TABS tablet Take 3 mg by mouth at bedtime.    [provider]  metoprolol  tartrate (LOPRESSOR ) 50 MG tablet Take 1 tablet (50 mg total) by mouth 2 (two) times daily. 12/03/22   Odell Celinda Balo, MD  pantoprazole  (PROTONIX ) 40 MG tablet Take 1 tablet (40 mg total) by mouth daily. 09/29/22   Akula, Vijaya, MD  thiamine  (VITAMIN B1) 100 MG tablet Take 1 tablet (100 mg total) by mouth daily. 09/17/22   Bryn Bernardino NOVAK, MD    Allergies: Patient has no known allergies.    Review of Systems  Reason unable to perform ROS: Patient unable to give ROS due to lethargy,  cognitive deficit, and dysphagia at baseline.  Respiratory:  Positive for shortness of breath.     Updated Vital Signs BP (!) 157/76 (BP Location: Left Arm)   Pulse 92   Temp 98.2 F (36.8 C) (Axillary)   Resp 13   SpO2 100%   Physical Exam Constitutional:      Appearance: He is ill-appearing.     Comments: Lethargic on exam. Falls asleep with eyes open.  HENT:     Head: Normocephalic and atraumatic.  Cardiovascular:     Rate and Rhythm: Normal rate and regular rhythm.  Pulmonary:     Breath sounds: Decreased  breath sounds present.  Abdominal:     General: Bowel sounds are normal.     Tenderness: There is no abdominal tenderness.  Musculoskeletal:     Comments: Bilateral BKA.  Skin:    General: Skin is warm and dry.  Neurological:     Mental Status: He is disoriented.     Comments: AxO to name.      (all labs ordered are listed, but only abnormal results are displayed) Labs Reviewed  CBC WITH DIFFERENTIAL/PLATELET - Abnormal; Notable for the following components:      Result Value   WBC 18.0 (*)    Platelets 418 (*)    Neutro Abs 15.1 (*)    Monocytes Absolute 1.1 (*)    Abs Immature Granulocytes 0.23 (*)    All other components within normal limits  BASIC METABOLIC PANEL WITH GFR - Abnormal; Notable for the following components:   Sodium 115 (*)    Chloride 75 (*)    Creatinine, Ser 0.54 (*)    Calcium  8.6 (*)    All other components within normal limits  CBG MONITORING, ED - Abnormal; Notable for the following components:   Glucose-Capillary 107 (*)    All other components within normal limits  I-STAT VENOUS BLOOD GAS, ED - Abnormal; Notable for the following components:   pH, Ven 7.227 (*)    pCO2, Ven 76.2 (*)    pO2, Ven 46 (*)    Bicarbonate 31.7 (*)    TCO2 34 (*)    Sodium 113 (*)    Calcium , Ion 1.08 (*)    All other components within normal limits  RESP PANEL BY RT-PCR (RSV, FLU A&B, COVID)  RVPGX2  CULTURE, BLOOD (ROUTINE X 2)  CULTURE, BLOOD (ROUTINE X 2)  RESPIRATORY PANEL BY PCR  BRAIN NATRIURETIC PEPTIDE  ETHANOL  SODIUM, URINE, RANDOM  OSMOLALITY, URINE  OSMOLALITY  TSH  URIC ACID  STREP PNEUMONIAE URINARY ANTIGEN  LEGIONELLA PNEUMOPHILA SEROGP 1 UR AG  BLOOD GAS, ARTERIAL  RAPID URINE DRUG SCREEN, HOSP PERFORMED  I-STAT CG4 LACTIC ACID, ED    EKG: None  Radiology: DG Chest Port 1 View Result Date: 04/04/2024 CLINICAL DATA:  Shortness of breath EXAM: PORTABLE CHEST 1 VIEW COMPARISON:  02/20/2024 FINDINGS: Hypoventilatory changes.  Cardiomegaly with vascular congestion. Heterogeneous ground-glass disease at the bases. No pleural effusion or pneumothorax. Aortic atherosclerosis IMPRESSION: Cardiomegaly with vascular congestion. Heterogeneous ground-glass disease at the bases, possible pneumonia. Electronically Signed   By: Luke Bun M.D.   On: 04/04/2024 16:57     Procedures   Medications Ordered in the ED  vancomycin  (VANCOCIN ) IVPB 1000 mg/200 mL premix (has no administration in time range)  ceFEPIme  (MAXIPIME ) 2 g in sodium chloride  0.9 % 100 mL IVPB (has no administration in time range)  methylPREDNISolone  sodium succinate (SOLU-MEDROL ) 125 mg/2 mL injection 125 mg (125 mg  Intravenous Given 04/04/24 1650)  ipratropium-albuterol  (DUONEB) 0.5-2.5 (3) MG/3ML nebulizer solution 3 mL (3 mLs Nebulization Given 04/04/24 1650)                                    Medical Decision Making Patient brought in by EMS for lethargy, SOB. Found desaturated to mid-70s. Initial CBG 68. EMS gave 2 duoneb treatments and D10. Currently on 3L [place], saturating 93%. Follow up CBG 102. BMP, CBC, VBG, respiratory PCR ordered. CXR. Will give solumedrol and duoneb.   VBG back showing respiratory acidosis, with pH of 7.227, pCO2 of 76.2, and bicarb of 31.7. BMP shows critical sodium value of 115 > 113 on VBG. Leukocytosis of 18.0 with left shift of 15.1. CXR shows heterogenous ground-glass disease at the bilateral bases, suggesting possible pneumonia. EKG shows sinus rhythm and a RBBB, which is there from prior.   Due to severe hyponatremia, will get a CT Head, as well as TSH and urine acid levels. Started on BiPap. Patient started on seizure precautions. Patient placed on droplet precaution for pneumonia, starting Vancomycin  and Cefepime . Blood cultures and lactic acid ordered.   ICU  team at bedside. Patient is obstructed and will be intubated.   Amount and/or Complexity of Data Reviewed Labs: ordered. Radiology:  ordered.  Risk Prescription drug management. Decision regarding hospitalization.       Final diagnoses:  None    ED Discharge Orders     None          Reigan Tolliver, DO 04/04/24 1917    Patt Alm Macho, MD 04/04/24 [phone]

## 2024-04-04 NOTE — ED Triage Notes (Signed)
 Pt BIB [facility] from [facility] for [facility]. Per EMS minimal report given from facility - 2LNC at baseline, O2 sats on EMS arrival mid-70%s, now 100% on duoneb treatment. Per EMS 2 duonebs given, and approx. 1 gram of D10%. Pt lethargic in triage; per EMS CBG initially 68, after D10% CBG 102. All other VSS per EMS.

## 2024-04-04 NOTE — Progress Notes (Signed)
 eLink Physician-Brief Progress Note Patient Name: Ronald  [name] DOB: 10-Oct-1955 MRN: [id number]   Date of Service  04/04/2024  HPI/Events of Note  ET Tube 1 cm above carina.  eICU Interventions  RT instructed to retract ET tube 1.5 cm.        [name] [name] 04/04/2024, 9:32 PM

## 2024-04-04 NOTE — Plan of Care (Signed)

## 2024-04-04 NOTE — ED Notes (Signed)
 Phlebotomy notified of BC draw.

## 2024-04-04 NOTE — Progress Notes (Signed)
 Pharmacy Antibiotic Note  Ronald  [name] is a 68 y.o. male for which pharmacy has been consulted for vancomycin  and zosyn  dosing for pneumonia.  Patient with a history of COPD, HTN, PVD, Hx of EtOH abuse, gout, bilateral BKA, . Patient presenting from SNF with AMS. Pt has been intubated in the ED.  Cefepime  given in the ED  SCr 0.62 WBC 14.5; LA 1.5; T 98.8; HR 72; RR 18 COVID neg / flu neg  Plan: Zosyn  3.375g IV q8h (4 hour infusion) Vancomycin  1250 mg q12hr (eAUC 535.4) unless change in renal function Monitor WBC, fever, renal function, cultures De-escalate when able Levels at steady state F/u MRSA PCR  Height: 5' 7 (170.2 cm) IBW/kg (Calculated) : 66.1  Temp (24hrs), Avg:98.2 F (36.8 C), Min:98.2 F (36.8 C), Max:98.2 F (36.8 C)  Recent Labs  Lab 04/04/24 1638 04/04/24 1820  WBC 18.0*  --   CREATININE 0.54*  --   LATICACIDVEN  --  1.5    CrCl cannot be calculated (Unknown ideal weight.).    No Known Allergies  Microbiology results: Pending  Thank you for allowing pharmacy to be a part of this patient's care.  Dorn Buttner, PharmD, BCPS 04/04/2024 7:42 PM ED Clinical Pharmacist -  [phone]

## 2024-04-04 NOTE — H&P (Cosign Needed Addendum)
 NAME:  Ronald [name], MRN:  [id number], DOB:  [birth year], LOS: 0 ADMISSION DATE:  04/04/2024, CONSULTATION DATE:  04/04/24  REFERRING MD:  Patt CHIEF COMPLAINT:  AMS   History of Present Illness:  Patient is a 68 year old male with significant past medical history of COPD, hypertension, PVD, history of EtOH abuse, hypertension, gout, bilateral BKA, and resides at [facility] skilled nursing facility who presents by EMS to [facility] ED for shortness of breath.  Per ED report, EMS had very little report from actual facility.,  Staff found patient lethargic and oxygen saturations within the mid 70s despite on 3 L nasal cannula.  EMS was called and patient was not on nasal cannula because of incorrect placement and immediately gave 2 DuoNeb treatments.  After those interventions O2 sats went back into the low 90s on 2 L nasal cannula.  CBG was 68 and EMS gave 1 g of D10 with CBG increasing to 102.  Upon arrival to ED, patient still lethargic.    Initial lab work revealed sodium of 113, potassium 4, chloride 75, CO2 30, glucose 97, BUN 9, creatinine 0.54, calcium  8.6, ionized calcium  1.08 VBG revealed 7.227 pH, PCO2 76.2, PO2 46, HCO3 31.7.  Chest x-ray shows heterogeneous groundglass at the bilateral bases-  PCCM consulted for altered mental status, acute on chronic hypoxic/hypercapnic respiratory failure, and hyponatremia.   Family at bedside to provide limited information about patient. most information obtained from patient's EMR.  Did ask family at bedside in regards to EtOH abuse.  Family stated that they did not know if he was still drinking at facility that he was at a previous facility in [facility] .   Pertinent  Medical History   PVD COPD Bilateral BKA EtOH abuse Gout Hypertension   Significant Hospital Events: Including procedures, antibiotic start and stop dates in addition to other pertinent events   7/23 PCCM called to admit for hyponatremia/respiratory  failure requiring intubation   Interim History / Subjective:  AMS, lethargic, attempted BIPAP  Patient becoming fully obstructed   Objective    Blood pressure (!) 157/76, pulse 92, temperature 98.2 F (36.8 C), temperature source Axillary, resp. rate 13, SpO2 100%.       No intake or output data in the 24 hours ending 04/04/24 1819 There were no vitals filed for this visit.  Examination: General: acute on chronically ill male, lying on ED stretcher in respiratory failure  HEENT: Normocephalic, PERRLA intact-sluggish, missing teeth, poor dentition, Pink MM CV: s1,s2, RRR, no MRG, No JVD  pulm: diminished breaths, appearing obstructed on BIPAP- not triggering vent  Abs: bs active, soft  Extremities: BL BKA, not following commands, moves extremities to pain  Skin: no rash  Neuro: Rass -2,responds to painful stimuli, cough gag reflex present  GU: intact   Resolved problem list   Assessment and Plan  Acute on chronic hypoxic/hypercarbic respiratory failure COPD history-on 3 L of home O2 at baseline Concern for CAP Intubated in emergency department by CCM due to becoming fully obstructed/not able to trigger BiPAP. P:  Admit to ICU Obtain chest x-ray follow-up with results, ABG stat 1 hour after intubation Continue ventilator support and lung protective strategies  Continue LTVV  Wean PEEP and Fio2 requirements to sat goal of >92%  HOB > 30 degrees Plat < 30  Aim for Driving pressures < 15  Intermittent Chest X-ray and ABGS Obtain and follow cultures-blood and tracheal aspirate VAP and PAD protocols in place  Wean sedation as tolerated,  SBT and WUA daily  Obtain RSV/FLu/Covid and RVP Panel- f/u with  Obtain tracheal aspirate  Received vanc and cefepime , continue vancomycin , change cefepime  to Zosyn  Obtain ECHO, BNP follow up with  Place on Triple BD therapy- brovana , pulmicort , yulperi   Acute Metabolic Encephalopathy  AMS  Suspect secondary to hyponatremia versus  hypercarbia, more likely multifactorial Cannot rule out intracranial processes or infectious process at this time  P: Start normal saline at 50 mL an hour, follow-up with urine studies may need 3% saline Check sodiums every 2 hours Obtain stat CT without contrast of head to rule out intracranial abnormalities Obtain blood cultures, follow-up with results Obtain tracheal aspirate, RVP panel, RSV/flu/COVID Obtain procalcitonin Follow-up with TSH Placed on seizure precautions Will need routine EEG  Hyponatremia Sodium 113 P: Follow-up with urine sodium, urine osmolality, uric acid, serum osmolality Placed on normal saline at 50 mL an hour for now - May need 3% hypertonic saline Check sodium q 2hrs   Hx of ETOH abuse P: Check ETOH level, f/u with Also check rapid drug screen per urine  HTN Hx  PVD hx  P: Placed on cardiac telemetry Slightly hypotensive after intubation, hold antihypertensives  Gout Hx P: Supportive care   Best Practice (right click and Reselect all SmartList Selections daily)   Diet/type: NPO  DVT prophylaxis prophylactic heparin   Pressure ulcer(s): N/A GI prophylaxis: PPI Lines: N/A Foley:  N/A Code Status:  full code Last date of multidisciplinary goals of care discussion - family updated in ED on 7/23   Labs   CBC: Recent Labs  Lab 04/04/24 1638 04/04/24 1643  WBC 18.0*  --   NEUTROABS 15.1*  --   HGB 13.4 16.0  HCT 39.6 47.0  MCV 87.2  --   PLT 418*  --     Basic Metabolic Panel: Recent Labs  Lab 04/04/24 1638 04/04/24 1643  NA 115* 113*  K 3.9 4.0  CL 75*  --   CO2 30  --   GLUCOSE 97  --   BUN 9  --   CREATININE 0.54*  --   CALCIUM  8.6*  --    GFR: CrCl cannot be calculated (Unknown ideal weight.). Recent Labs  Lab 04/04/24 1638  WBC 18.0*    Liver Function Tests: No results for input(s): AST, ALT, ALKPHOS, BILITOT, PROT, ALBUMIN in the last 168 hours. No results for input(s): LIPASE, AMYLASE in  the last 168 hours. No results for input(s): AMMONIA in the last 168 hours.  ABG    Component Value Date/Time   PHART 7.3 (L) 02/18/2024 1230   PCO2ART 64 (H) 02/18/2024 1230   PO2ART 127 (H) 02/18/2024 1230   HCO3 31.7 (H) 04/04/2024 1643   TCO2 34 (H) 04/04/2024 1643   ACIDBASEDEF 0.5 02/17/2024 1033   O2SAT 71 04/04/2024 1643     Coagulation Profile: No results for input(s): INR, PROTIME in the last 168 hours.  Cardiac Enzymes: No results for input(s): CKTOTAL, CKMB, CKMBINDEX, TROPONINI in the last 168 hours.  HbA1C: Hgb A1c MFr Bld  Date/Time Value Ref Range Status  02/17/2024 12:28 PM 5.4 4.8 - 5.6 % Final    Comment:    (NOTE) Diagnosis of Diabetes The following HbA1c ranges recommended by the American Diabetes Association (ADA) may be used as an aid in the diagnosis of diabetes mellitus.  Hemoglobin             Suggested A1C NGSP%  Diagnosis  <5.7                   Non Diabetic  5.7-6.4                Pre-Diabetic  >6.4                   Diabetic  <7.0                   Glycemic control for                       adults with diabetes.    09/02/2022 09:03 PM 5.6 4.8 - 5.6 % Final    Comment:    (NOTE)         Prediabetes: 5.7 - 6.4         Diabetes: >6.4         Glycemic control for adults with diabetes: <7.0     CBG: Recent Labs  Lab 04/04/24 1619  GLUCAP 107*    Review of Systems:   See HPI   Past Medical History:  He,  has a past medical history of Critical lower limb ischemia (HCC), ETOH abuse, Gout, and Hypertension.   Surgical History:   Past Surgical History:  Procedure Laterality Date   ABDOMINAL AORTAGRAM N/A 12/26/2013   Procedure: ABDOMINAL AORTAGRAM;  Surgeon: Gaile LELON New, MD;  Location: [facility] CATH LAB;  Service: Cardiovascular;  Laterality: N/A;   AMPUTATION Left 12/28/2013   Procedure: LEFT BELOW KNEE AMPUTATION;  Surgeon: Jerona LULLA Sage, MD;  Location: [facility] OR;  Service: Orthopedics;  Laterality:  Left;   AMPUTATION Right 11/17/2022   Procedure: RIGHT BELOW KNEE AMPUTATION;  Surgeon: Sage Jerona LULLA, MD;  Location: [facility] OR;  Service: Orthopedics;  Laterality: Right;   BELOW KNEE LEG AMPUTATION     Left   LEG SURGERY       Social History:   reports that he has been smoking cigarettes. He has a 20 pack-year smoking history. He has never used smokeless tobacco. He reports current alcohol  use of about 120.0 standard drinks of alcohol  per week. He reports current drug use. Drugs: Cocaine  and Marijuana.   Family History:  His family history includes Diabetes Mellitus II in his mother. There is no history of CAD or Stroke.   Allergies No Known Allergies   Home Medications  Prior to Admission medications   Medication Sig Start Date End Date Taking? Authorizing Provider  acetaminophen  (TYLENOL ) 325 MG tablet Take 650 mg by mouth every 6 (six) hours as needed for mild pain (pain score 1-3). Do not exceed 3000mg  in 24 hours. (Note order for scheduled 1000mg  BID)    [provider]  acetaminophen  (TYLENOL ) 500 MG tablet Take 1,000 mg by mouth in the morning and at bedtime.    [provider]  amLODipine  (NORVASC ) 10 MG tablet Take 10 mg by mouth daily.    [provider]  busPIRone  (BUSPAR ) 5 MG tablet Take 5 mg by mouth at bedtime.    [provider]  cetirizine (ZYRTEC) 10 MG tablet Take 10 mg by mouth daily. Patient not taking: Reported on 02/17/2024    [provider]  docusate sodium  (COLACE) 100 MG capsule Take 100 mg by mouth daily.    [provider]  ergocalciferol, VITAMIN D2, (DRISDOL) 200 MCG/ML drops Take 2,000 Units by mouth daily. 0.80ml = 2000 units = 50 mcg    [provider]  fluticasone  (FLONASE) 50 MCG/ACT nasal spray Place 1 spray into both nostrils daily. Patient not taking: Reported on 02/17/2024    [provider]  fluticasone -salmeterol (ADVAIR ) 250-50 MCG/ACT AEPB Inhale 1 puff into the lungs in the  morning and at bedtime.    [provider]  guaiFENesin -dextromethorphan  (ROBITUSSIN DM) 100-10 MG/5ML syrup Take 10 mLs by mouth every 6 (six) hours as needed for cough.    [provider]  ipratropium-albuterol  (DUONEB) 0.5-2.5 (3) MG/3ML SOLN Take 3 mLs by nebulization in the morning and at bedtime.    [provider]  ipratropium-albuterol  (DUONEB) 0.5-2.5 (3) MG/3ML SOLN Take 3 mLs by nebulization every 6 (six) hours as needed (shortness of breath or wheezing).    [provider]  melatonin 3 MG TABS tablet Take 3 mg by mouth at bedtime.    [provider]  metoprolol  tartrate (LOPRESSOR ) 50 MG tablet Take 1 tablet (50 mg total) by mouth 2 (two) times daily. 12/03/22   Odell Celinda Balo, MD  pantoprazole  (PROTONIX ) 40 MG tablet Take 1 tablet (40 mg total) by mouth daily. 09/29/22   Akula, Vijaya, MD  thiamine  (VITAMIN B1) 100 MG tablet Take 1 tablet (100 mg total) by mouth daily. 09/17/22   Bryn Bernardino NOVAK, MD     Critical care time: 40 mins     Christian Claudene CANNON   [facility] 04/04/2024, 6:19 PM  Please see [email or web address] for pager details.  From 7A-7P if no response, please call [phone]. After hours, please call ELink [phone].

## 2024-04-04 NOTE — Progress Notes (Signed)
 ETT retracted from 26-24 per MD.

## 2024-04-04 NOTE — Progress Notes (Signed)
 Pt transported from room 28 in ED to 2M02 then back from 2M02 to CT then back to 2M02 on the ventilator without any complications

## 2024-04-04 NOTE — Progress Notes (Signed)
 ED Pharmacy Antibiotic Sign Off An antibiotic consult was received from an ED provider for cefepime  per pharmacy dosing for bacteremia. A chart review was completed to assess appropriateness.  A single dose of vancomycin  placed by the ED provider.   The following one time order(s) were placed per pharmacy consult:  cefepime  2000 mg x 1 dose  Further antibiotic and/or antibiotic pharmacy consults should be ordered by the admitting provider if indicated.   Thank you for allowing pharmacy to be a part of this patient's care.   Dorn Buttner, PharmD, BCPS 04/04/2024 6:07 PM ED Clinical Pharmacist -  [phone]

## 2024-04-04 NOTE — Progress Notes (Signed)
 eLink Physician-Brief Progress Note Patient Name: Ronald [name] DOB: 1956-08-05 MRN: [id number]   Date of Service  04/04/2024  HPI/Events of Note  Patient admitted with hyponatremia, altered mental status, and acute respiratory failure requiring intubation and mechanical ventilation. Work up is in progress.  eICU Interventions  New Patient Evaluation.     Intervention Category Intermediate Interventions: Other:  [name] U [name] 04/04/2024, 9:35 PM

## 2024-04-05 ENCOUNTER — Inpatient Hospital Stay (HOSPITAL_COMMUNITY)

## 2024-04-05 DIAGNOSIS — J9621 Acute and chronic respiratory failure with hypoxia: Secondary | ICD-10-CM | POA: Diagnosis not present

## 2024-04-05 DIAGNOSIS — R569 Unspecified convulsions: Secondary | ICD-10-CM

## 2024-04-05 DIAGNOSIS — R0609 Other forms of dyspnea: Secondary | ICD-10-CM

## 2024-04-05 DIAGNOSIS — E871 Hypo-osmolality and hyponatremia: Secondary | ICD-10-CM | POA: Diagnosis not present

## 2024-04-05 DIAGNOSIS — J9622 Acute and chronic respiratory failure with hypercapnia: Secondary | ICD-10-CM | POA: Diagnosis not present

## 2024-04-05 DIAGNOSIS — G9341 Metabolic encephalopathy: Secondary | ICD-10-CM

## 2024-04-05 LAB — ECHOCARDIOGRAM COMPLETE
AR max vel: 2.47 cm2
AV Area VTI: 2.15 cm2
AV Area mean vel: 2.28 cm2
AV Mean grad: 2 mmHg
AV Peak grad: 3.9 mmHg
Ao pk vel: 0.98 m/s
Area-P 1/2: 2.47 cm2
Height: 67.008 in
S' Lateral: 3 cm
Weight: 3068.8 [oz_av]

## 2024-04-05 LAB — RAPID URINE DRUG SCREEN, HOSP PERFORMED
Amphetamines: NOT DETECTED
Barbiturates: NOT DETECTED
Benzodiazepines: POSITIVE — AB
Cocaine: NOT DETECTED
Opiates: NOT DETECTED
Tetrahydrocannabinol: NOT DETECTED

## 2024-04-05 LAB — GLUCOSE, CAPILLARY
Glucose-Capillary: 134 mg/dL — ABNORMAL HIGH (ref 70–99)
Glucose-Capillary: 147 mg/dL — ABNORMAL HIGH (ref 70–99)
Glucose-Capillary: 131 mg/dL — ABNORMAL HIGH (ref 70–99)
Glucose-Capillary: 126 mg/dL — ABNORMAL HIGH (ref 70–99)
Glucose-Capillary: 115 mg/dL — ABNORMAL HIGH (ref 70–99)

## 2024-04-05 LAB — SODIUM
Sodium: 118 mmol/L — CL (ref 135–145)
Sodium: 120 mmol/L — ABNORMAL LOW (ref 135–145)
Sodium: 122 mmol/L — ABNORMAL LOW (ref 135–145)
Sodium: 124 mmol/L — ABNORMAL LOW (ref 135–145)
Sodium: 126 mmol/L — ABNORMAL LOW (ref 135–145)
Sodium: 125 mmol/L — ABNORMAL LOW (ref 135–145)

## 2024-04-05 LAB — PHOSPHORUS: Phosphorus: 2.7 mg/dL (ref 2.5–4.6)

## 2024-04-05 LAB — OSMOLALITY, URINE: Osmolality, Ur: 143 mosm/kg — ABNORMAL LOW (ref 300–900)

## 2024-04-05 LAB — MAGNESIUM: Magnesium: 1.7 mg/dL (ref 1.7–2.4)

## 2024-04-05 LAB — SODIUM, URINE, RANDOM: Sodium, Ur: <10 mmol/L

## 2024-04-05 MED ORDER — LORAZEPAM 1 MG PO TABS: 1.0000 mg | ORAL_TABLET | ORAL | Status: AC | PRN

## 2024-04-05 MED ORDER — ORAL CARE MOUTH RINSE
15.0000 mL | OROMUCOSAL | Status: DC
  Administered 2024-04-05 (×3): 15 mL via OROMUCOSAL

## 2024-04-05 MED ORDER — PROSOURCE TF20 ENFIT COMPATIBL EN LIQD: 60.0000 mL | Freq: Every day | ENTERAL | Status: DC

## 2024-04-05 MED ORDER — VITAL HP 1.0 CAL PO LIQD: 1000.0000 mL | ORAL | Status: DC

## 2024-04-05 MED ORDER — FAMOTIDINE 20 MG PO TABS
20.0000 mg | ORAL_TABLET | Freq: Two times a day (BID) | ORAL | Status: DC
  Administered 2024-04-05 (×2): 20 mg
  Filled 2024-04-05 (×2): qty 1

## 2024-04-05 MED ORDER — ORAL CARE MOUTH RINSE: 15.0000 mL | OROMUCOSAL | Status: DC | PRN

## 2024-04-05 MED ORDER — ADULT MULTIVITAMIN W/MINERALS CH
1.0000 | ORAL_TABLET | Freq: Every day | ORAL | Status: DC
  Administered 2024-04-05: 1
  Filled 2024-04-05: qty 1

## 2024-04-05 MED ORDER — THIAMINE MONONITRATE 100 MG PO TABS: 100.0000 mg | ORAL_TABLET | Freq: Every day | ORAL | Status: DC

## 2024-04-05 MED ORDER — FOLIC ACID 1 MG PO TABS
1.0000 mg | ORAL_TABLET | Freq: Every day | ORAL | Status: DC
  Administered 2024-04-05: 1 mg
  Filled 2024-04-05: qty 1

## 2024-04-05 MED ORDER — SODIUM CHLORIDE 0.9 % IV SOLN: INTRAVENOUS | Status: DC

## 2024-04-05 NOTE — TOC Initial Note (Addendum)
 Transition of Care [facility]) - Initial/Assessment Note    Patient Details  Name: Ronald [name]  Vroom MRN: [id number] Date of Birth: Jan 30, 1956  Transition of Care [facility]) CM/SW Contact:    Lauraine FORBES Saa, LCSW Phone Number: 04/05/2024, 9:18 AM  Clinical Narrative:                  9:18 AM Per chart review, patient is from [facility]. SNF admissions confirmed patient is LTC at [facility] and able to return upon discharge. CSW acknowledged and closed TOC consult for substance use as TOC does not assist with providing resources for tobacco use. Patient has a PCP and insurance. Patient has DME (prosthetic leg, wheelchair, BSC) history with [facility]. Patient has HH history with [facility]. Patient is currently intubated. CSW will continue to follow and be available to assist.  Expected Discharge Plan: Long Term Nursing Home Barriers to Discharge: Continued Medical Work up   Patient Goals and CMS Choice            Expected Discharge Plan and Services In-house Referral: Clinical Social Work   Post Acute Care Choice: Skilled Nursing Facility Living arrangements for the past 2 months: Skilled Nursing Facility                                      Prior Living Arrangements/Services Living arrangements for the past 2 months: Skilled Nursing Facility Lives with:: Facility Resident Patient language and need for interpreter reviewed:: Yes        Need for Family Participation in Patient Care: Yes (Comment) Care giver support system in place?: Yes (comment)   Criminal Activity/Legal Involvement Pertinent to Current Situation/Hospitalization: No - Comment as needed  Activities of Daily Living   ADL Screening (condition at time of admission) Independently performs ADLs?: Yes (appropriate for developmental age) Is the patient deaf or have difficulty hearing?: No Does the patient have difficulty seeing, even when wearing glasses/contacts?: No Does the patient have difficulty  concentrating, remembering, or making decisions?: No  Permission Sought/Granted Permission sought to share information with : Family Supports, Oceanographer granted to share information with : No (Contact information on chart)  Share Information with NAME: [name] Saltness  Permission granted to share info w AGENCY: [facility] SNF LTC  Permission granted to share info w Relationship: Sister  Permission granted to share info w Contact Information: [phone]  Emotional Assessment Appearance:: Appears stated age Attitude/Demeanor/Rapport: Unable to Assess, Intubated (Following Commands or Not Following Commands) Affect (typically observed): Unable to Assess   Alcohol  / Substance Use: Not Applicable Psych Involvement: No (comment)  Admission diagnosis:  Hypercapnic respiratory failure (HCC) [J96.92] Patient Active Problem List   Diagnosis Date Noted   Hypercapnic respiratory failure (HCC) 04/04/2024   Acute on chronic respiratory failure with hypoxia and hypercapnia (HCC) 02/17/2024   Acute osteomyelitis of right calcaneus (HCC) 11/10/2022   PVD (peripheral vascular disease) (HCC) 11/10/2022   Respiratory failure (HCC) 10/15/2022   Chronic hypercapnic respiratory failure (HCC) 09/19/2022   Protein-calorie malnutrition, severe 09/04/2022   Acute hypercapnic respiratory failure (HCC) 09/02/2022   Hx of BKA, left (HCC) 09/02/2022   Hypotension 09/02/2022   Right bundle branch block 09/02/2022   Acute respiratory failure (HCC) 09/02/2022   Hyponatremia 03/26/2019   Altered mental status 03/26/2019   Acute hypoxemic respiratory failure (HCC) 03/26/2019   Dysphagia 03/26/2019   Acute encephalopathy 03/18/2019  COPD exacerbation (HCC) 08/29/2017   Subdural hematoma (HCC) 06/19/2014   Critical lower limb ischemia (HCC) 12/25/2013   Subacute osteomyelitis, right ankle and foot (HCC) 11/06/2013   HTN (hypertension), benign 11/06/2013   Alcohol  abuse  11/06/2013   Osteomyelitis of left foot (HCC) 11/06/2013   COPD (chronic obstructive pulmonary disease) (HCC) 11/06/2013   Accelerated hypertension 09/25/2013   Hypokalemia 09/25/2013   Abscess of left foot 09/21/2013   Cellulitis of left foot 09/19/2013   PCP:  Duwaine Annabella SAILOR, FNP Pharmacy:   [facility] [phone] [address], [place] - [address] [place] [address] Phone: [phone] Fax: [phone]     Social Drivers of Health (SDOH) Social History: SDOH Screenings   Food Insecurity: No Food Insecurity (04/04/2024)  Housing: Low Risk  (04/04/2024)  Transportation Needs: No Transportation Needs (04/04/2024)  Utilities: Not At Risk (04/04/2024)  Financial Resource Strain: Not on File (07/30/2022)   Received from [facility]  Physical Activity: Not on File (12/31/2021)   Received from [facility]  Social Connections: Patient Unable To Answer (04/04/2024)  Stress: Not on File (12/31/2021)   Received from [facility]  Tobacco Use: High Risk (04/04/2024)   SDOH Interventions:     Readmission Risk Interventions    02/20/2024   11:45 AM  Readmission Risk Prevention Plan  Transportation Screening Complete  PCP or Specialist Appt within 5-7 Days Complete  Home Care Screening Complete  Medication Review (RN CM) Complete

## 2024-04-05 NOTE — Progress Notes (Signed)
 NAME:  Ronald [name], MRN:  [id number], DOB:  1956/08/31, LOS: 1 ADMISSION DATE:  04/04/2024, CONSULTATION DATE:  04/04/24  REFERRING MD:  Patt CHIEF COMPLAINT:  AMS   History of Present Illness:  Patient is a 68 year old male with significant past medical history of COPD, hypertension, PVD, history of EtOH abuse, hypertension, gout, bilateral BKA, and resides at [facility] skilled nursing facility who presents by EMS to [facility] ED for shortness of breath.  Per ED report, EMS had very little report from actual facility.,  Staff found patient lethargic and oxygen saturations within the mid 70s despite on 3 L nasal cannula.  EMS was called and patient was not on nasal cannula because of incorrect placement and immediately gave 2 DuoNeb treatments.  After those interventions O2 sats went back into the low 90s on 2 L nasal cannula.  CBG was 68 and EMS gave 1 g of D10 with CBG increasing to 102.  Upon arrival to ED, patient still lethargic.    Initial lab work revealed sodium of 113, potassium 4, chloride 75, CO2 30, glucose 97, BUN 9, creatinine 0.54, calcium  8.6, ionized calcium  1.08 VBG revealed 7.227 pH, PCO2 76.2, PO2 46, HCO3 31.7.  Chest x-ray shows heterogeneous groundglass at the bilateral bases-  PCCM consulted for altered mental status, acute on chronic hypoxic/hypercapnic respiratory failure, and hyponatremia.   Family at bedside to provide limited information about patient. most information obtained from patient's EMR.  Did ask family at bedside in regards to EtOH abuse.  Family stated that they did not know if he was still drinking at facility that he was at a previous facility in [place] [facility] .  Pertinent  Medical History   PVD COPD Bilateral BKA EtOH abuse Gout Hypertension   Significant Hospital Events: Including procedures, antibiotic start and stop dates in addition to other pertinent events   7/23 PCCM called to admit for hyponatremia/respiratory  failure requiring intubation   Interim History / Subjective:   Remains on the ventilator.  Weaning on PSV Sedation is coming down.  Waking up appropriately   Objective    Blood pressure (!) 143/86, pulse 62, temperature 98.2 F (36.8 C), resp. rate 18, height 5' 7.01 (1.702 m), weight 87 kg, SpO2 100%.    Vent Mode: PRVC FiO2 (%):  [40 %-100 %] 40 % Set Rate:  [18 bmp] 18 bmp Vt Set:  [500 mL-520 mL] 520 mL PEEP:  [5 cmH20] 5 cmH20 Plateau Pressure:  [15 cmH20-17 cmH20] 16 cmH20   Intake/Output Summary (Last 24 hours) at 04/05/2024 0747 Last data filed at 04/05/2024 0700 Gross per 24 hour  Intake 1206.26 ml  Output 1625 ml  Net -418.74 ml   Filed Weights   04/04/24 2006 04/05/24 0405  Weight: 86.6 kg 87 kg    Examination: Blood pressure (!) 143/86, pulse 62, temperature 98.2 F (36.8 C), resp. rate 18, height 5' 7.01 (1.702 m), weight 87 kg, SpO2 100%. Gen:      No acute distress HEENT:  EOMI, sclera anicteric, ETT Neck:     No masses; no thyromegaly Lungs:    Clear to auscultation bilaterally; normal respiratory effort CV:         Regular rate and rhythm; no murmurs Abd:      + bowel sounds; soft, non-tender; no palpable masses, no distension Ext:   B/L BKA Neuro: Sedated  Lab/imaging reviewed Significant for sodium 120, WBC 14.5 No new imaging   Resolved problem list   Assessment and  Plan  Acute on chronic hypoxic/hypercarbic respiratory failure COPD history-on 3 L of home O2 at baseline Concern for CAP Intubated in emergency department by CCM due to becoming fully obstructed/not able to trigger BiPAP. P:  DC vanco, continue zosyn  Continue vent Wean on PSV as tolerated Echo pending Obtain RSV/FLu/Covid and RVP Panel- f/u with  Place on Triple BD therapy- brovana , pulmicort , yulperi   Acute Metabolic Encephalopathy  AMS  Suspect secondary to hyponatremia versus hypercarbia, more likely multifactorial Cannot rule out intracranial processes or  infectious process at this time  P: CT head with no acute intracranial abnormality Continue monitoring Following commands as sedation is being weaned.   Acute on chronic Hypoosmolar hyponatremia --> Unclear etiology  Previous hx of psychogenic polydipsia.  No clinical evidence of heart failure or cirrhosis and spite of EtOH history baseline 130s. Prior hospitalization late 2024 for hypoNa felt related to low solute intake. ADH normal, ACTH low but was on steroids for AECOPD  P: Continue NS at 50cc/hr. Recheck urine sodium, urine osmolality, uric acid, serum osmolality Follow-up sodium q 4hrs TSH is normal.  Check a.m. cortisol  Hx of ETOH abuse P: Negative EtOH levels.  Urine drug screen positive for benzos  HTN Hx  PVD hx  P: Placed on cardiac telemetry Slightly hypotensive after intubation, hold antihypertensives  Gout Hx P: Supportive care  Best Practice (right click and Reselect all SmartList Selections daily)   Diet/type: NPO  DVT prophylaxis prophylactic heparin   Pressure ulcer(s): N/A GI prophylaxis: H2B Lines: N/A Foley:  N/A Code Status:  full code Last date of multidisciplinary goals of care discussion - family updated in ED on 7/23   Critical care time:    The patient is critically ill with multiple organ system failure and requires high complexity decision making for assessment and support, frequent evaluation and titration of therapies, advanced monitoring, review of radiographic studies and interpretation of complex data.   Critical Care Time devoted to patient care services, exclusive of separately billable procedures, described in this note is 35 minutes.   Akio Hudnall MD [facility] See Amion for pager  If no response to pager , please call [phone] until 7pm After 7:00 pm call Elink  [phone] 04/05/2024, 8:30 AM

## 2024-04-05 NOTE — Progress Notes (Signed)
 Patient extubated per MD order with RN at bedside. Positive cuff leak before extubation. Patient placed on 3L nasal cannula. Tolerated well. Able to speak. Vitals stable.

## 2024-04-05 NOTE — Progress Notes (Signed)
EEG complete. Results pending.  ?

## 2024-04-05 NOTE — Procedures (Signed)
 Patient Name: Deonte Otting  MRN: [id number]  Epilepsy Attending: Arlin MALVA Krebs  Referring Physician/Provider: Claudene Fonda BROCKS, NP  Date: 04/05/2024 Duration: 23.04 mins  Patient history: 68yo male with ams. EEG to evaluate for seizure  Level of alertness: Awake  AEDs during EEG study: None  Technical aspects: This EEG study was done with scalp electrodes positioned according to the 10-20 International system of electrode placement. Electrical activity was reviewed with band pass filter of 1-70Hz , sensitivity of 7 uV/mm, display speed of 57mm/sec with a 60Hz  notched filter applied as appropriate. EEG data were recorded continuously and digitally stored.  Video monitoring was available and reviewed as appropriate.  Description: EEG showed continuous generalized 3 to 5 Hz theta-delta slowing. Physiologic photic driving was not seen during photic stimulation. Hyperventilation was not performed.     ABNORMALITY - Continuous slow, generalized  IMPRESSION: This study is suggestive of moderate diffuse encephalopathy. No seizures or epileptiform discharges were seen throughout the recording.  Bobi Daudelin O Wali Reinheimer

## 2024-04-05 NOTE — Progress Notes (Addendum)
 Initial Nutrition Assessment  DOCUMENTATION CODES:  Obesity unspecified  INTERVENTION:  If pt not to be extubated, recommend tube feeding via OGT: Vital High Protein at 50 ml/h (1200 ml per day) Start at 25 and advance by 10mL every 8 hours to reach goal Prosource TF20 60 ml 1x/d Provides 1280 kcal, 125 gm protein, 1003 ml free water daily Multivitamin with minerals daily   NUTRITION DIAGNOSIS:  Inadequate oral intake related to inability to eat as evidenced by NPO status.  GOAL:  Patient will meet greater than or equal to 90% of their needs  MONITOR:  TF tolerance, Vent status, Labs, Weight trends  REASON FOR ASSESSMENT:  Ventilator    ASSESSMENT:  Pt with hx of PVD s/p bilateral BKA, DM type 2, COPD, HTN, and hx drug and alcohol  abuse presented to ED from his SNF with AMS and hypoxia.   Patient is currently intubated on ventilator support. Minimal vent settings this AM and on weaning trial. Pt awake on vent an able to nod or shake head to questions. Doesn't think he's lost weight, appetite has been stable.   Reviewed weight and does not appear to have decreased. Well nourished on exam. Do note that pt required cortrak placement and enteral feeds during an admission at [facility] in January. Has a hx of requiring a modified diet, but pt typically refuses this intervention.   Pt discussed during ICU rounds and with RN and MD. Hopeful to be able to extubate pt today if mental status is appropriate. Will leave TF recommendations in the event that pt remains on the vent.  MV: 4.6 L/min Temp (24hrs), Avg:98.4 F (36.9 C), Min:96.8 F (36 C), Max:99.1 F (37.3 C) MAP (cuff): 103 mmHg  Admit weight: 86.6 kg   Current weight: 87 kg    Intake/Output Summary (Last 24 hours) at 04/05/2024 1327 Last data filed at 04/05/2024 1100 Gross per 24 hour  Intake 1725.95 ml  Output 2000 ml  Net -274.05 ml  Net IO Since Admission: -274.05 mL [04/05/24 1327]  Drains/Lines: OGT 18  Fr.  UOP since admission  Nutritionally Relevant Medications: Scheduled Meds:  docusate  100 mg Per Tube BID   polyethylene glycol  17 g Per Tube Daily   Continuous Infusions:  sodium chloride  50 mL/hr at 04/05/24 0700   piperacillin -tazobactam (ZOSYN )  IV 12.5 mL/hr at 04/05/24 0700   vancomycin  Stopped (04/05/24 0024)   PRN Meds:.docusate sodium , polyethylene glycol  Labs Reviewed: Sodium 120, chloride 75 CBG ranges from 107-147 mg/dL over the last 24 hours HgbA1c 5.4% (02/17/24)  NUTRITION - FOCUSED PHYSICAL EXAM: Flowsheet Row Most Recent Value  Orbital Region No depletion  Upper Arm Region Mild depletion  Thoracic and Lumbar Region No depletion  Buccal Region No depletion  Temple Region No depletion  Clavicle Bone Region No depletion  Clavicle and Acromion Bone Region No depletion  Scapular Bone Region No depletion  Dorsal Hand Mild depletion  Patellar Region Unable to assess  [wheelchair bound]  Anterior Thigh Region Unable to assess  [wheelchair bound]  Posterior Calf Region Unable to assess  [bilateral BKA]  Edema (RD Assessment) Mild  [thighs, hands]  Hair Reviewed  Eyes Reviewed  Mouth Reviewed  Skin Reviewed  Nails Reviewed    Diet Order:   Diet Order             Diet NPO time specified  Diet effective now  EDUCATION NEEDS:  Not appropriate for education at this time  Skin:  Skin Assessment: Reviewed RN Assessment  Last BM:  7/22  Height:  Ht Readings from Last 1 Encounters:  04/04/24 5' 7.01 (1.702 m)    Weight:  Wt Readings from Last 1 Encounters:  04/05/24 87 kg    Ideal Body Weight:  59.3 kg  BMI:  Body mass index is 34.1 kg/m. (Adjusted for bilateral BKA using 7/24 wt)  Estimated Nutritional Needs:  Using ASPEN Critical Care guidelines Kcal:  1000-1200 kcal/d Protein:  119g/d Fluid:  2L/d    Vernell Lukes, RD, LDN, CNSC Registered Dietitian II Please reach out via secure chat

## 2024-04-06 DIAGNOSIS — G9341 Metabolic encephalopathy: Secondary | ICD-10-CM | POA: Diagnosis not present

## 2024-04-06 DIAGNOSIS — J9622 Acute and chronic respiratory failure with hypercapnia: Secondary | ICD-10-CM | POA: Diagnosis not present

## 2024-04-06 DIAGNOSIS — J9621 Acute and chronic respiratory failure with hypoxia: Secondary | ICD-10-CM | POA: Diagnosis not present

## 2024-04-06 DIAGNOSIS — E871 Hypo-osmolality and hyponatremia: Secondary | ICD-10-CM | POA: Diagnosis not present

## 2024-04-06 LAB — GLUCOSE, CAPILLARY
Glucose-Capillary: 108 mg/dL — ABNORMAL HIGH (ref 70–99)
Glucose-Capillary: 115 mg/dL — ABNORMAL HIGH (ref 70–99)
Glucose-Capillary: 118 mg/dL — ABNORMAL HIGH (ref 70–99)
Glucose-Capillary: 121 mg/dL — ABNORMAL HIGH (ref 70–99)
Glucose-Capillary: 124 mg/dL — ABNORMAL HIGH (ref 70–99)
Glucose-Capillary: 95 mg/dL (ref 70–99)

## 2024-04-06 LAB — CBC
HCT: 38.8 % — ABNORMAL LOW (ref 39.0–52.0)
Hemoglobin: 13.4 g/dL (ref 13.0–17.0)
MCH: 29.6 pg (ref 26.0–34.0)
MCHC: 34.5 g/dL (ref 30.0–36.0)
MCV: 85.7 fL (ref 80.0–100.0)
Platelets: 379 K/uL (ref 150–400)
RBC: 4.53 MIL/uL (ref 4.22–5.81)
RDW: 14.6 % (ref 11.5–15.5)
WBC: 17 K/uL — ABNORMAL HIGH (ref 4.0–10.5)
nRBC: 0 % (ref 0.0–0.2)

## 2024-04-06 LAB — BASIC METABOLIC PANEL WITH GFR
Anion gap: 11 (ref 5–15)
BUN: 14 mg/dL (ref 8–23)
CO2: 29 mmol/L (ref 22–32)
Calcium: 8.8 mg/dL — ABNORMAL LOW (ref 8.9–10.3)
Chloride: 85 mmol/L — ABNORMAL LOW (ref 98–111)
Creatinine, Ser: 0.72 mg/dL (ref 0.61–1.24)
GFR, Estimated: >60 mL/min (ref >60)
Glucose, Bld: 107 mg/dL — ABNORMAL HIGH (ref 70–99)
Potassium: 4.2 mmol/L (ref 3.5–5.1)
Sodium: 125 mmol/L — ABNORMAL LOW (ref 135–145)

## 2024-04-06 LAB — SODIUM
Sodium: 127 mmol/L — ABNORMAL LOW (ref 135–145)
Sodium: 129 mmol/L — ABNORMAL LOW (ref 135–145)

## 2024-04-06 LAB — MAGNESIUM: Magnesium: 1.8 mg/dL (ref 1.7–2.4)

## 2024-04-06 LAB — CORTISOL: Cortisol, Plasma: 2.4 ug/dL

## 2024-04-06 LAB — PHOSPHORUS: Phosphorus: 2.9 mg/dL (ref 2.5–4.6)

## 2024-04-06 LAB — LEGIONELLA PNEUMOPHILA SEROGP 1 UR AG: L. pneumophila Serogp 1 Ur Ag: NEGATIVE

## 2024-04-06 MED ORDER — POLYETHYLENE GLYCOL 3350 17 G PO PACK
17.0000 g | PACK | Freq: Every day | ORAL | Status: DC
  Administered 2024-04-06 – 2024-04-08 (×3): 17 g via ORAL
  Filled 2024-04-06 (×3): qty 1

## 2024-04-06 MED ORDER — COSYNTROPIN 0.25 MG IJ SOLR
0.2500 mg | Freq: Once | INTRAMUSCULAR | Status: AC
  Administered 2024-04-07: 0.25 mg via INTRAVENOUS
  Filled 2024-04-06: qty 0.25

## 2024-04-06 MED ORDER — BUSPIRONE HCL 5 MG PO TABS
5.0000 mg | ORAL_TABLET | Freq: Every day | ORAL | Status: DC
  Administered 2024-04-06 – 2024-04-07 (×2): 5 mg via ORAL
  Filled 2024-04-06 (×2): qty 1

## 2024-04-06 MED ORDER — FOLIC ACID 1 MG PO TABS
1.0000 mg | ORAL_TABLET | Freq: Every day | ORAL | Status: DC
  Administered 2024-04-06 – 2024-04-08 (×3): 1 mg via ORAL
  Filled 2024-04-06 (×3): qty 1

## 2024-04-06 MED ORDER — MELATONIN 3 MG PO TABS
3.0000 mg | ORAL_TABLET | Freq: Every day | ORAL | Status: DC
  Administered 2024-04-06 – 2024-04-07 (×2): 3 mg via ORAL
  Filled 2024-04-06 (×3): qty 1

## 2024-04-06 MED ORDER — SODIUM CHLORIDE 1 G PO TABS
1.0000 g | ORAL_TABLET | Freq: Two times a day (BID) | ORAL | Status: DC
  Administered 2024-04-06 – 2024-04-08 (×6): 1 g via ORAL
  Filled 2024-04-06 (×6): qty 1

## 2024-04-06 MED ORDER — THIAMINE MONONITRATE 100 MG PO TABS
100.0000 mg | ORAL_TABLET | Freq: Every day | ORAL | Status: DC
  Administered 2024-04-06 – 2024-04-08 (×3): 100 mg via ORAL
  Filled 2024-04-06 (×3): qty 1

## 2024-04-06 MED ORDER — METOPROLOL TARTRATE 50 MG PO TABS
50.0000 mg | ORAL_TABLET | Freq: Two times a day (BID) | ORAL | Status: DC
  Administered 2024-04-06 – 2024-04-08 (×5): 50 mg via ORAL
  Filled 2024-04-06 (×3): qty 1
  Filled 2024-04-06: qty 2
  Filled 2024-04-06: qty 1

## 2024-04-06 MED ORDER — MAGNESIUM SULFATE 2 GM/50ML IV SOLN
2.0000 g | Freq: Once | INTRAVENOUS | Status: AC
  Administered 2024-04-06: 2 g via INTRAVENOUS
  Filled 2024-04-06: qty 50

## 2024-04-06 MED ORDER — SODIUM CHLORIDE 0.9 % IV SOLN
2.0000 g | INTRAVENOUS | Status: AC
  Administered 2024-04-06 – 2024-04-08 (×3): 2 g via INTRAVENOUS
  Filled 2024-04-06 (×3): qty 20

## 2024-04-06 MED ORDER — ADULT MULTIVITAMIN W/MINERALS CH
1.0000 | ORAL_TABLET | Freq: Every day | ORAL | Status: DC
  Administered 2024-04-06 – 2024-04-08 (×3): 1 via ORAL
  Filled 2024-04-06 (×3): qty 1

## 2024-04-06 NOTE — Progress Notes (Signed)
 Nutrition Follow-up  DOCUMENTATION CODES:  Obesity unspecified  INTERVENTION:  Continue current diet as ordered, adjust to ordering assistance Multivitamin with minerals daily Monitor for need to add nutrition supplements  NUTRITION DIAGNOSIS:  Inadequate oral intake related to inability to eat as evidenced by NPO status. - progressing  GOAL:  Patient will meet greater than or equal to 90% of their needs - progressing  MONITOR:  TF tolerance, Vent status, Labs, Weight trends  REASON FOR ASSESSMENT:  Ventilator, Consult Enteral/tube feeding initiation and management  ASSESSMENT:  Pt with hx of PVD s/p bilateral BKA, DM type 2, COPD, HTN, and hx drug and alcohol  abuse presented to ED from his SNF with AMS and hypoxia.   7/23 - presented to ED from nursing facility, intubated 7/24 - extubated  Pt able to be extubated late yesterday afternoon and passed bedside swallow evaluation. Currently on regular diet. Will monitor intake trends to determine if a nutrition supplement is warranted.   Admit weight: 86.6 kg   Current weight: 83.6 kg    Intake/Output Summary (Last 24 hours) at 04/06/2024 0851 Last data filed at 04/06/2024 9251 Gross per 24 hour  Intake 802.72 ml  Output 2525 ml  Net -1722.28 ml  Net IO Since Admission: -2,276.69 mL [04/06/24 0851]  Drains/Lines: UOP x 24 hours  Nutritionally Relevant Medications: Scheduled Meds:  folic acid   1 mg Oral Daily   multivitamin with minerals  1 tablet Oral Daily   polyethylene glycol  17 g Oral Daily   sodium chloride   1 g Oral BID WC   thiamine   100 mg Oral Daily   Continuous Infusions:  cefTRIAXone  (ROCEPHIN )  IV     PRN Meds:.docusate sodium , polyethylene glycol  Labs Reviewed: Sodium 125, chloride 85 CBG ranges from 108-126 mg/dL over the last 24 hours HgbA1c 5.4% (02/17/24)  NUTRITION - FOCUSED PHYSICAL EXAM: Flowsheet Row Most Recent Value  Orbital Region No depletion  Upper Arm Region Mild  depletion  Thoracic and Lumbar Region No depletion  Buccal Region No depletion  Temple Region No depletion  Clavicle Bone Region No depletion  Clavicle and Acromion Bone Region No depletion  Scapular Bone Region No depletion  Dorsal Hand Mild depletion  Patellar Region Unable to assess  [wheelchair bound]  Anterior Thigh Region Unable to assess  [wheelchair bound]  Posterior Calf Region Unable to assess  [bilateral BKA]  Edema (RD Assessment) Mild  [thighs, hands]  Hair Reviewed  Eyes Reviewed  Mouth Reviewed  Skin Reviewed  Nails Reviewed    Diet Order:   Diet Order             Diet regular Room service appropriate? Yes; Fluid consistency: Thin  Diet effective now                   EDUCATION NEEDS:  Not appropriate for education at this time  Skin:  Skin Assessment: Reviewed RN Assessment  Last BM:  7/22  Height:  Ht Readings from Last 1 Encounters:  04/04/24 5' 7.01 (1.702 m)    Weight:  Wt Readings from Last 1 Encounters:  04/06/24 83.6 kg    Ideal Body Weight:  59.3 kg  BMI:  Body mass index is 34.1 kg/m. (Adjusted for bilateral BKA using 7/24 wt)  Estimated Nutritional Needs:  Kcal:  1700-2000 kcal/d Protein:  85-100 g/d Fluid:  2L/d    Vernell Lukes, RD, LDN, CNSC Registered Dietitian II Please reach out via secure chat

## 2024-04-06 NOTE — Progress Notes (Signed)
 NAME:  Ronald  [name], MRN:  [id number], DOB:  12-31-1955, LOS: 2 ADMISSION DATE:  04/04/2024, CONSULTATION DATE:  04/04/24  REFERRING MD:  Patt CHIEF COMPLAINT:  AMS   History of Present Illness:  Patient is a 68 year old male with significant past medical history of COPD, hypertension, PVD, history of EtOH abuse, hypertension, gout, bilateral BKA, and resides at [facility] skilled nursing facility who presents by EMS to [facility] ED for shortness of breath.  Per ED report, EMS had very little report from actual facility.,  Staff found patient lethargic and oxygen saturations within the mid 70s despite on 3 L nasal cannula.  EMS was called and patient was not on nasal cannula because of incorrect placement and immediately gave 2 DuoNeb treatments.  After those interventions O2 sats went back into the low 90s on 2 L nasal cannula.  CBG was 68 and EMS gave 1 g of D10 with CBG increasing to 102.  Upon arrival to ED, patient still lethargic.    Initial lab work revealed sodium of 113, potassium 4, chloride 75, CO2 30, glucose 97, BUN 9, creatinine 0.54, calcium  8.6, ionized calcium  1.08 VBG revealed 7.227 pH, PCO2 76.2, PO2 46, HCO3 31.7.  Chest x-ray shows heterogeneous groundglass at the bilateral bases-  PCCM consulted for altered mental status, acute on chronic hypoxic/hypercapnic respiratory failure, and hyponatremia.   Family at bedside to provide limited information about patient. most information obtained from patient's EMR.  Did ask family at bedside in regards to EtOH abuse.  Family stated that they did not know if he was still drinking at facility that he was at a previous facility in [place] .  Pertinent  Medical History   PVD COPD Bilateral BKA EtOH abuse Gout Hypertension   Significant Hospital Events: Including procedures, antibiotic start and stop dates in addition to other pertinent events   7/23 PCCM called to admit for hyponatremia/respiratory  failure requiring intubation  7/24 mental status improved, extubated  Interim History / Subjective:   Extubated yesterday evening.  No issues overnight  Objective    Blood pressure 115/65, pulse 89, temperature 98.1 F (36.7 C), resp. rate 12, height 5' 7.01 (1.702 m), weight 83.6 kg, SpO2 95%.    Vent Mode: CPAP;PSV FiO2 (%):  [40 %] 40 % PEEP:  [5 cmH20] 5 cmH20 Pressure Support:  [5 cmH20-10 cmH20] 5 cmH20   Intake/Output Summary (Last 24 hours) at 04/06/2024 0800 Last data filed at 04/06/2024 0700 Gross per 24 hour  Intake 802.72 ml  Output 2325 ml  Net -1522.28 ml   Filed Weights   04/04/24 2006 04/05/24 0405 04/06/24 0500  Weight: 86.6 kg 87 kg 83.6 kg    Examination: Gen:      No acute distress HEENT:  EOMI, sclera anicteric Neck:     No masses; no thyromegaly Lungs:    Clear to auscultation bilaterally; normal respiratory effort CV:         Regular rate and rhythm; no murmurs Abd:      + bowel sounds; soft, non-tender; no palpable masses, no distension Ext:    Bilateral BKA Neuro: alert and oriented x 3 Psych: normal mood and affect   Lab/imaging reviewed Significant for sodium 125, BUN/creatinine 14/0.72 WBC 17.0, hemoglobin 13.4, platelets 379  Echo with LVEF 60 to 65%, normal RV systolic size and function  Resolved problem list   Assessment and Plan  Acute on chronic hypoxic/hypercarbic respiratory failure COPD history-on 3 L of home O2 at baseline  Concern for CAP Intubated in emergency department by CCM due to becoming fully obstructed/not able to trigger BiPAP. 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  No clinical evidence of heart failure or cirrhosis and spite of EtOH history baseline 130s. Prior hospitalization late 2024 for hypoNa felt related to low solute intake. ADH normal, ACTH low but was on steroids for AECOPD  P: DC NS. Order salt tabs TSH is normal.  AM cortisol is low normal. Can consider ACTH stim test for further eval but low urine Na suggests hypovolumic hyponatremia  Hx of ETOH abuse P: Negative EtOH levels.  Urine drug screen positive for benzos CIWA protocol  HTN Hx  PVD hx  P: Placed on cardiac telemetry Restart lopressor . Holding norvasc   Gout Hx P: Supportive care  Stable for transfer out of ICU and to hospitalist service.  Best Practice (right click and Reselect all SmartList Selections daily)   Diet/type: PO Diet DVT prophylaxis prophylactic heparin   Pressure ulcer(s): N/A GI prophylaxis: H2B Lines: N/A Foley:  N/A Code Status:  full code Last date of multidisciplinary goals of care discussion - family updated in ED on 7/23   Critical care time:    Cejay Cambre MD [facility] See Amion for pager  If no response to pager , please call [phone] until 7pm After 7:00 pm call Elink  [phone] 04/06/2024, 8:00 AM

## 2024-04-06 NOTE — Evaluation (Signed)
 Physical Therapy Evaluation Patient Details Name: Ronald [name] MRN: [id number] DOB: 1955/10/12 Today's Date: 04/06/2024  History of Present Illness  Pt is a 68 y.o. male who presented 04/04/24 with AMS and SOB. Admitted with acute on chronic hypoxic/hypercarbic respiratory failure, concern for CAP, acute metabolic encephalopathy, and hyponatremia. CT head with no acute intracranial abnormality. ETT 7/23-7/24. PMH: polysubstance abuse, cocaine , COPD, chronic hypoxemic respiratory failure, alcohol  abuse, bilateral BKA, chronic hyponatremia, gout, HTN   Clinical Impression  Pt presents with condition above and deficits mentioned below, see PT Problem List. The pt is currently a poor historian, contradicting himself often in regards to his current desires, pain levels, and PLOF. No family currently present to provide PLOF info. The pt is also quick to agitation, flailing his arms around and hitting the bed whenever he would get upset. He stated that at baseline he required intermittent assistance for bed mobility and used a slide board for transfers to his w/c. Currently, pt was only willing to demonstrate transitioning supine <> sit in bed, assuming long-sitting position rather than sitting EOB. He required CGA-minA and heavy use of the rails to elevate his trunk. He declined attempts at transfers OOB this date. He demonstrates deficits in cognition, balance, strength, activity tolerance, and bil knee extension ROM. He could benefit from further skilled PT services at his SNF to improve his independence and safety with mobility. Will continue to follow acutely.      If plan is discharge home, recommend the following: Two people to help with walking and/or transfers;Assistance with cooking/housework;A lot of help with bathing/dressing/bathroom;Direct supervision/assist for medications management;Direct supervision/assist for financial management;Assist for transportation;Help with stairs or ramp for  entrance;Supervision due to cognitive status   Can travel by private vehicle   No    Equipment Recommendations Other (comment) (defer to next venue of care)  Recommendations for Other Services       Functional Status Assessment Patient has had a recent decline in their functional status and demonstrates the ability to make significant improvements in function in a reasonable and predictable amount of time.     Precautions / Restrictions Precautions Precautions: Fall;Other (comment) Precaution/Restrictions Comments: watch SpO2 Restrictions Weight Bearing Restrictions Per Provider Order: No      Mobility  Bed Mobility Overal bed mobility: Needs Assistance Bed Mobility: Supine to Sit, Sit to Supine     Supine to sit: Contact guard, Min assist, Used rails, HOB elevated Sit to supine: Supervision, HOB elevated, Used rails   General bed mobility comments: Pt heavily relying on rails to pull his trunk up to sit up long sitting in bed and semi-pivot hips towards but not all the way to L EOB. Pt pulled himself up to long sitting midline in bed x2 more reps. Pt required CGA-minA at trunk to complete this transitions. He could return himself to supine with supervision. Pt easily agitated and declining to attempt any further mobility.    Transfers                   General transfer comment: Pt declined attempts    Ambulation/Gait               General Gait Details: unable at baseline, bil BKA  Stairs            Wheelchair Mobility     Tilt Bed    Modified Rankin (Stroke Patients Only)       Balance Overall balance assessment: Needs assistance Sitting-balance support: Bilateral upper extremity  supported, Feet unsupported Sitting balance-Leahy Scale: Poor Sitting balance - Comments: heavy reliance on bed rails to hold trunk up off bed surface to briefly long sit in bed with CGA-minA before pt returned himself to supine due to agitation        Standing balance comment: unable at baseline, bil BKA                             Pertinent Vitals/Pain Pain Assessment Pain Assessment: Faces Faces Pain Scale: Hurts a little bit Pain Location: generalized (contradicted himself stating he hurt then later stating he has no pain) Pain Descriptors / Indicators: Discomfort Pain Intervention(s): Limited activity within patient's tolerance, Monitored during session    Home Living Family/patient expects to be discharged to:: Skilled nursing facility                   Additional Comments: Per chart, pt is a long-term resident at a SNF.    Prior Function Prior Level of Function : Patient poor historian/Family not available;Needs assist             Mobility Comments: Pt contradicts himself at times and easily agitated with questions, limiting ability to obtain PLOF info. Per pt, pt needs intermittent assistance for bed mobility, relying heavily on bed rails to pull up to sit EOB. He reported he doesn't get OOB then later stated he gets up OOB to the bathroom and w/c all the time, stating he uses a slide board. He likely needed some assistance for slide board transfers ADLs Comments: likely needed assistance for ADLs, pt easily agitated with questions so unable to obtain all PLOF info     Extremity/Trunk Assessment   Upper Extremity Assessment Upper Extremity Assessment: Overall WFL for tasks assessed    Lower Extremity Assessment Lower Extremity Assessment: RLE deficits/detail;LLE deficits/detail;Generalized weakness RLE Deficits / Details: hx of bil BKA; limited knee extension ROM by ~10'; WFL knee flexion AROM; denied numbness/tingling; skin integrity intact distally; grossly >/= 4-/5 MMT LLE Deficits / Details: hx of bil BKA; limited knee extension ROM by ~10'; WFL knee flexion AROM; denied numbness/tingling; skin integrity intact distally; grossly >/= 4-/5 MMT    Cervical / Trunk Assessment Cervical / Trunk  Assessment: Normal  Communication   Communication Communication: No apparent difficulties    Cognition Arousal: Alert Behavior During Therapy: Agitated   PT - Cognitive impairments: No family/caregiver present to determine baseline                       PT - Cognition Comments: Pt contradicts himself often, reporting pain then denying it, requesting end of bed up then getting agitated when it was elevated then asking HOB to be elevated then getting agitated stating he did not want it up but rather down. Pt easily agitated with any questions to understand his pain and PLOF or to try to mobilize, resulting in pt flailing his arms, hitting his hands down on the bed. Pt talked over therapist, frustrated he did not understand what PT was asking of him but often not allowing therapist to finish the question or explanation Following commands: Impaired Following commands impaired: Follows one step commands inconsistently     Cueing Cueing Techniques: Verbal cues     General Comments General comments (skin integrity, edema, etc.): attempted to educate pt on importance of mobility to reduce risk of deconditioning, but pt not receptive to education  Exercises     Assessment/Plan    PT Assessment Patient needs continued PT services  PT Problem List Decreased strength;Decreased range of motion;Decreased activity tolerance;Decreased balance;Decreased mobility;Decreased cognition;Cardiopulmonary status limiting activity       PT Treatment Interventions DME instruction;Functional mobility training;Therapeutic activities;Therapeutic exercise;Balance training;Cognitive remediation;Neuromuscular re-education;Patient/family education;Wheelchair mobility training    PT Goals (Current goals can be found in the Care Plan section)  Acute Rehab PT Goals Patient Stated Goal: to get better, but then would state desire to be left alone PT Goal Formulation: With patient Time For Goal Achievement:  04/20/24 Potential to Achieve Goals: Fair    Frequency Min 1X/week     Co-evaluation               AM-PAC PT 6 Clicks Mobility  Outcome Measure Help needed turning from your back to your side while in a flat bed without using bedrails?: A Little Help needed moving from lying on your back to sitting on the side of a flat bed without using bedrails?: A Little Help needed moving to and from a bed to a chair (including a wheelchair)?: Total Help needed standing up from a chair using your arms (e.g., wheelchair or bedside chair)?: Total Help needed to walk in hospital room?: Total Help needed climbing 3-5 steps with a railing? : Total 6 Click Score: 10    End of Session Equipment Utilized During Treatment: Oxygen Activity Tolerance: Treatment limited secondary to agitation Patient left: in bed;with call bell/phone within reach;with bed alarm set Nurse Communication: Mobility status;Other (comment) (pt agitation) PT Visit Diagnosis: Muscle weakness (generalized) (M62.81)    Time: 9050-9040 PT Time Calculation (min) (ACUTE ONLY): 10 min   Charges:   PT Evaluation $PT Eval Moderate Complexity: 1 Mod   PT General Charges $$ ACUTE PT VISIT: 1 Visit         Theo Ferretti, PT, DPT Acute Rehabilitation Services  Office: [phone]   Theo CHRISTELLA Ferretti 04/06/2024, 11:34 AM

## 2024-04-06 NOTE — TOC Progression Note (Signed)
 Transition of Care [facility]) - Progression Note    Patient Details  Name: Ronald [name]  [place] MRN: [id number] Date of Birth: 19-Dec-1955  Transition of Care [facility]) CM/SW Contact  Lauraine FORBES Saa, LCSW Phone Number: 04/06/2024, 11:48 AM  Clinical Narrative:     11:48 AM CSW sent FL2 to [facility]. Per progressions, patient is not yet medically ready to return to SNF. CSW will continue to follow and be available to assist.  Expected Discharge Plan: Long Term Nursing Home Barriers to Discharge: Continued Medical Work up               Expected Discharge Plan and Services In-house Referral: Clinical Social Work   Post Acute Care Choice: Skilled Nursing Facility Living arrangements for the past 2 months: Skilled Nursing Facility                                       Social Drivers of Health (SDOH) Interventions SDOH Screenings   Food Insecurity: No Food Insecurity (04/04/2024)  Housing: Low Risk  (04/04/2024)  Transportation Needs: No Transportation Needs (04/04/2024)  Utilities: Not At Risk (04/04/2024)  Financial Resource Strain: Not on File (07/30/2022)   Received from [facility]  Physical Activity: Not on File (12/31/2021)   Received from [facility]  Social Connections: Patient Unable To Answer (04/04/2024)  Stress: Not on File (12/31/2021)   Received from [facility]  Tobacco Use: High Risk (04/04/2024)    Readmission Risk Interventions    02/20/2024   11:45 AM  Readmission Risk Prevention Plan  Transportation Screening Complete  PCP or Specialist Appt within 5-7 Days Complete  Home Care Screening Complete  Medication Review (RN CM) Complete

## 2024-04-06 NOTE — Progress Notes (Signed)
 [facility] ADULT ICU REPLACEMENT PROTOCOL   The patient does apply for the [facility] Adult ICU Electrolyte Replacment Protocol based on the criteria listed below:   1.Exclusion criteria: TCTS, ECMO, Dialysis, and Myasthenia Gravis patients 2. Is GFR >/= 30 ml/min? Yes.    Patient's GFR today is >60 3. Is SCr </= 2? Yes.   Patient's SCr is 0.72 mg/dL 4. Did SCr increase >/= 0.5 in 24 hours? No. 5.Pt's weight >40kg  Yes.   6. Abnormal electrolyte(s): Magnesium   7. Electrolytes replaced per protocol 8.  Call MD STAT for K+ </= 2.5, Phos </= 1, or Mag </= 1 Physician:  Dr. Ogan  Ronald [name] A Ronald [name] 04/06/2024 4:11 AM

## 2024-04-06 NOTE — Progress Notes (Signed)
 Pt confused about date and location. Insists he is at [facility] hospital and that his [facility] room (#222B) is just down the hall with his belongings. He is demanding that staff go get the stuff out of his top drawer in his room. Unable to reorient to location. Pt unable to articulate specifically what he wants from drawer in his room mentioned above.    Pt attempting to call daughters and the facillity using callbell/TV remote. Pt states he cant get dial tone and is not redirectable to real phone.

## 2024-04-06 NOTE — NC FL2 (Signed)
 [place]  MEDICAID FL2 LEVEL OF CARE FORM     IDENTIFICATION  Patient Name: Lannis  Balaban Birthdate: Aug 28, 1956 Sex: male Admission Date (Current Location): 04/04/2024  [facility] and [name] Number:  Producer, television/film/video and Address:  [facility], [address], [place], [place] [address]      Provider Number: [id number]  Attending Physician Name and Address:  Mannam, Praveen, MD  Relative Name and Phone Number:  [name] Saltness; Sister; [phone]    Current Level of Care: Hospital Recommended Level of Care: Skilled Nursing Facility Prior Approval Number:    Date Approved/Denied:   PASRR Number: [id number]  Discharge Plan: SNF    Current Diagnoses: Patient Active Problem List   Diagnosis Date Noted   Hypercapnic respiratory failure (HCC) 04/04/2024   Acute on chronic respiratory failure with hypoxia and hypercapnia (HCC) 02/17/2024   Acute osteomyelitis of right calcaneus (HCC) 11/10/2022   PVD (peripheral vascular disease) (HCC) 11/10/2022   Respiratory failure (HCC) 10/15/2022   Chronic hypercapnic respiratory failure (HCC) 09/19/2022   Protein-calorie malnutrition, severe 09/04/2022   Acute hypercapnic respiratory failure (HCC) 09/02/2022   Hx of BKA, left (HCC) 09/02/2022   Hypotension 09/02/2022   Right bundle branch block 09/02/2022   Acute respiratory failure (HCC) 09/02/2022   Hyponatremia 03/26/2019   Altered mental status 03/26/2019   Acute hypoxemic respiratory failure (HCC) 03/26/2019   Dysphagia 03/26/2019   Acute encephalopathy 03/18/2019   COPD exacerbation (HCC) 08/29/2017   Subdural hematoma (HCC) 06/19/2014   Critical lower limb ischemia (HCC) 12/25/2013   Subacute osteomyelitis, right ankle and foot (HCC) 11/06/2013   HTN (hypertension), benign 11/06/2013   Alcohol  abuse 11/06/2013   Osteomyelitis of left foot (HCC) 11/06/2013   COPD (chronic obstructive pulmonary disease) (HCC) 11/06/2013   Accelerated  hypertension 09/25/2013   Hypokalemia 09/25/2013   Abscess of left foot 09/21/2013   Cellulitis of left foot 09/19/2013    Orientation RESPIRATION BLADDER Height & Weight        O2 (4L nasal cannula) Continent, Indwelling catheter Weight: 184 lb 4.9 oz (83.6 kg) Height:  5' 7.01 (170.2 cm)  BEHAVIORAL SYMPTOMS/MOOD NEUROLOGICAL BOWEL NUTRITION STATUS    Convulsions/Seizures (Has history of seizures) Continent Diet (Please see discharge summary)  AMBULATORY STATUS COMMUNICATION OF NEEDS Skin   Extensive Assist Verbally Normal                       Personal Care Assistance Level of Assistance  Bathing, Feeding, Dressing Bathing Assistance: Maximum assistance Feeding assistance: Maximum assistance Dressing Assistance: Maximum assistance     Functional Limitations Info             SPECIAL CARE FACTORS FREQUENCY  PT (By licensed PT), OT (By licensed OT)     PT Frequency: 5x OT Frequency: 5x            Contractures Contractures Info: Not present    Additional Factors Info  Code Status, Allergies, Insulin  Sliding Scale Code Status Info: Full Code Allergies Info: NKA   Insulin  Sliding Scale Info: Please see discharge summary       Current Medications (04/06/2024):  This is the current hospital active medication list Current Facility-Administered Medications  Medication Dose Route Frequency Provider Last Rate Last Admin   arformoterol  (BROVANA ) nebulizer solution 15 mcg  15 mcg Nebulization BID Smith, Joshua C, NP   15 mcg at 04/06/24 0708   budesonide  (PULMICORT ) nebulizer solution 0.25 mg  0.25 mg Nebulization BID Claudene,  Joshua C, NP   0.25 mg at 04/06/24 9291   busPIRone  (BUSPAR ) tablet 5 mg  5 mg Oral QHS Mannam, Praveen, MD       cefTRIAXone  (ROCEPHIN ) 2 g in sodium chloride  0.9 % 100 mL IVPB  2 g Intravenous Q24H Mannam, Praveen, MD       Chlorhexidine  Gluconate Cloth 2 % PADS 6 each  6 each Topical Daily Kara Dorn NOVAK, MD   6 each at 04/05/24 2250    [START ON 04/07/2024] cosyntropin (CORTROSYN) injection 0.25 mg  0.25 mg Intravenous Once Mannam, Praveen, MD       docusate sodium  (COLACE) capsule 100 mg  100 mg Oral BID PRN Kara Dorn NOVAK, MD       folic acid  (FOLVITE ) tablet 1 mg  1 mg Oral Daily Merilee Linsey I, RPH   1 mg at 04/06/24 9164   heparin  injection 5,000 Units  5,000 Units Subcutaneous Q8H Dewald, Jonathan B, MD   5,000 Units at 04/06/24 0518   LORazepam  (ATIVAN ) tablet 1-4 mg  1-4 mg Per Tube Q1H PRN Mannam, Praveen, MD       melatonin tablet 3 mg  3 mg Oral QHS Mannam, Praveen, MD       metoprolol  tartrate (LOPRESSOR ) tablet 50 mg  50 mg Oral BID Mannam, Praveen, MD   50 mg at 04/06/24 0833   multivitamin with minerals tablet 1 tablet  1 tablet Oral Daily Merilee Linsey I, [facility]   1 tablet at 04/06/24 9161   Oral care mouth rinse  15 mL Mouth Rinse PRN Mannam, Praveen, MD       polyethylene glycol (MIRALAX  / GLYCOLAX ) packet 17 g  17 g Oral Daily PRN Dewald, Jonathan B, MD       polyethylene glycol (MIRALAX  / GLYCOLAX ) packet 17 g  17 g Oral Daily Merilee Linsey I, RPH   17 g at 04/06/24 9165   revefenacin  (YUPELRI ) nebulizer solution 175 mcg  175 mcg Nebulization Daily Smith, Joshua C, NP   175 mcg at 04/06/24 0708   sodium chloride  tablet 1 g  1 g Oral BID WC Merilee Linsey I, RPH   1 g at 04/06/24 9164   thiamine  (VITAMIN B1) tablet 100 mg  100 mg Oral Daily Mitchell, Madeline I, RPH   100 mg at 04/06/24 9165     Discharge Medications: Please see discharge summary for a list of discharge medications.  Relevant Imaging Results:  Relevant Lab Results:   Additional Information SS#[id number]  Lauraine FORBES Saa, LCSW

## 2024-04-06 NOTE — Progress Notes (Signed)
 PHARMACY - PHYSICIAN COMMUNICATION CRITICAL VALUE ALERT - BLOOD CULTURE IDENTIFICATION (BCID)  Ronald  [name] is an 68 y.o. male who presented to [facility] on 04/04/2024 with a chief complaint of AMS/COPD  Assessment:   1/2 blood cultures growing Gram positive rods, likely contaminant  Name of physician (or Provider) Contacted:  Dr. Epimenio  Current antibiotics:  Rocephin   Changes to prescribed antibiotics recommended:  No changes at this time  No results found for this or any previous visit.  Dail Cordella Misty 04/06/2024  10:52 PM

## 2024-04-07 DIAGNOSIS — E871 Hypo-osmolality and hyponatremia: Secondary | ICD-10-CM

## 2024-04-07 DIAGNOSIS — J9602 Acute respiratory failure with hypercapnia: Secondary | ICD-10-CM | POA: Diagnosis not present

## 2024-04-07 LAB — GLUCOSE, CAPILLARY
Glucose-Capillary: 133 mg/dL — ABNORMAL HIGH (ref 70–99)
Glucose-Capillary: 130 mg/dL — ABNORMAL HIGH (ref 70–99)
Glucose-Capillary: 121 mg/dL — ABNORMAL HIGH (ref 70–99)

## 2024-04-07 LAB — BASIC METABOLIC PANEL WITH GFR
Anion gap: 12 (ref 5–15)
BUN: 6 mg/dL — ABNORMAL LOW (ref 8–23)
CO2: 28 mmol/L (ref 22–32)
Calcium: 8.6 mg/dL — ABNORMAL LOW (ref 8.9–10.3)
Chloride: 90 mmol/L — ABNORMAL LOW (ref 98–111)
Creatinine, Ser: 0.56 mg/dL — ABNORMAL LOW (ref 0.61–1.24)
GFR, Estimated: >60 mL/min (ref >60)
Glucose, Bld: 93 mg/dL (ref 70–99)
Potassium: 3.8 mmol/L (ref 3.5–5.1)
Sodium: 130 mmol/L — ABNORMAL LOW (ref 135–145)

## 2024-04-07 LAB — CBC
HCT: 42.2 % (ref 39.0–52.0)
Hemoglobin: 14 g/dL (ref 13.0–17.0)
MCH: 29.5 pg (ref 26.0–34.0)
MCHC: 33.2 g/dL (ref 30.0–36.0)
MCV: 88.8 fL (ref 80.0–100.0)
Platelets: 411 K/uL — ABNORMAL HIGH (ref 150–400)
RBC: 4.75 MIL/uL (ref 4.22–5.81)
RDW: 15.4 % (ref 11.5–15.5)
WBC: 12.8 K/uL — ABNORMAL HIGH (ref 4.0–10.5)
nRBC: 0 % (ref 0.0–0.2)

## 2024-04-07 LAB — SODIUM: Sodium: 130 mmol/L — ABNORMAL LOW (ref 135–145)

## 2024-04-07 LAB — ACTH STIMULATION, 3 TIME POINTS
Cortisol, 30 Min: 13 ug/dL
Cortisol, 60 Min: 12.8 ug/dL
Cortisol, Base: 13.5 ug/dL

## 2024-04-07 LAB — CORTISOL: Cortisol, Plasma: 20.2 ug/dL

## 2024-04-07 NOTE — Progress Notes (Signed)
 PROGRESS NOTE    Efe  Lenora  FMW:[id number] DOB: 07/20/1956 DOA: 04/04/2024 PCP: Duwaine Annabella SAILOR, FNP    Brief Narrative:  68 year old with history of COPD, hypertension, peripheral vascular disease, previous history of alcohol  use, hypertension, gout, bilateral BKA who is a long-term nursing home resident at [facility] skilled nursing facility brought to the emergency room for shortness of breath, he was found lethargic and oxygen saturation 70% on 3 L oxygen.  Apparently when EMS arrived, patient was not on nasal cannula oxygen.  He was given DuoNebs, put back on oxygen, blood glucose was 68.  Patient was lethargic on arrival to ER with sodium 113, potassium 4, chloride 75.  VBG with pH 7.22, [id number].  Chest x-ray with heterogenous groundglass at bilateral bases.  Admitted to ICU due to hyponatremia and altered mental status.  Significant events 7/23, admitted to ICU with hyponatremia, respiratory failure requiring intubation 7/24, mental status improved, extubated and now back on nasal cannula oxygen. 7/26, transferred to medical floor.  Subjective: Patient seen and examined.  Denies any complaints.  He is alert awake and oriented.  Patient tells me that he does drink too much water and that could be the problem.  He was in the hospital with exact same thing last month. Denies alcohol  use.  He does smoke at the nursing home taking off his oxygen.  Feels like he is back to himself.  Assessment & Plan:   Acute on chronic hypoxemic and hypercapnic respiratory failure secondary to altered mental status COPD with chronic hypoxemia, on 3 L oxygen at baseline Suspected pneumonia  Patient was intubated for airway protection and unable to trigger BiPAP in the emergency room. Extubated to nasal cannula oxygen now.  Keep on oxygen to keep saturation more than 90%. Mental status has improved and resolved. Patient on  Rocephin , continue today.  Can possibly complete oral antibiotics  starting tomorrow for 5 days. Blood cultures, 1 bottle with gram-positive rods that is likely contaminant.  Wait until final cultures. COVID, RSV, flu negative. Continue Brovana , Pulmicort , Yupelri .  Chest physiotherapy.  Altered mental status, acute metabolic encephalopathy: Secondary to hypercarbia, hyponatremia and multifactorial. CT head negative.  No focal deficits.  Improved with correction of sodium.  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  Patient presented with sodium of 115-isotonic saline-gradually improving 127-129/130.  Similar presentation month ago.  TSH normal.   Encouraged full meals, no sodium restriction, however water restrictions to 1500 mL/day.  Patient is medically stabilized.  Will recheck his sodium tomorrow and if he is stable anticipate going back to long-term care nursing home.  Patient is agreeable.   DVT prophylaxis: heparin  injection 5,000 Units Start: 04/04/24 2200 SCDs Start: 04/04/24 1850   Code Status: Full code Family Communication: None at the bedside Disposition Plan: Status is: Inpatient Remains inpatient appropriate because: Under active treatment,     Consultants:  Critical care  Procedures:  Intubation  Antimicrobials:  Rocephin  7/24---     Objective: Vitals:   04/06/24 2340 04/07/24 0319 04/07/24 0320 04/07/24 0826  BP: (!) 150/80 (!) 146/77  (!) 159/95  Pulse: 73 77  94  Resp: 18 17  17   Temp: 98.4 F (36.9 C) 98.3 F (36.8 C)  98 F (36.7 C)  TempSrc: Oral Oral    SpO2: 95% 93%  99%  Weight:   86.2 kg   Height:        Intake/Output Summary (Last 24 hours) at 04/07/2024  9146 Last data filed at 04/07/2024 0600 Gross per 24 hour  Intake 720 ml  Output 1675 ml  Net -955 ml   Filed Weights   04/05/24 0405 04/06/24 0500 04/07/24 0320  Weight: 87 kg 83.6 kg 86.2 kg    Examination:  General: Alert awake and oriented.   Fairly comfortable and interactive. Cardiovascular: S1-S2 normal.  Regular rate rhythm. Respiratory: Bilateral clear.  No added sounds.  On 3 L oxygen. Gastrointestinal: Soft.  Nontender.  Bowel sound present. Ext: No swelling or edema.  No cyanosis.  He has bilateral BKA stumps that are clean and dry. Neuro: Alert awake.     Data Reviewed: I have personally reviewed following labs and imaging studies  CBC: Recent Labs  Lab 04/04/24 1638 04/04/24 1643 04/04/24 2021 04/04/24 2251 04/06/24 0124 04/07/24 0605  WBC 18.0*  --  14.5*  --  17.0* 12.8*  NEUTROABS 15.1*  --   --   --   --   --   HGB 13.4 16.0 13.7 14.3 13.4 14.0  HCT 39.6 47.0 40.6 42.0 38.8* 42.2  MCV 87.2  --  86.6  --  85.7 88.8  PLT 418*  --  400  --  379 411*   Basic Metabolic Panel: Recent Labs  Lab 04/04/24 1638 04/04/24 1643 04/04/24 2021 04/04/24 2151 04/04/24 2251 04/05/24 0320 04/05/24 1800 04/05/24 2123 04/06/24 0124 04/06/24 0827 04/06/24 2003 04/07/24 0605 04/07/24 0721  NA 115* 113* 117*   < > 114*   < > 126*   < > 125* 127* 129* 130* 130*  K 3.9 4.0  --   --  4.4  --   --   --  4.2  --   --  3.8  --   CL 75*  --   --   --   --   --   --   --  85*  --   --  90*  --   CO2 30  --   --   --   --   --   --   --  29  --   --  28  --   GLUCOSE 97  --   --   --   --   --   --   --  107*  --   --  93  --   BUN 9  --   --   --   --   --   --   --  14  --   --  6*  --   CREATININE 0.54*  --  0.62  --   --   --   --   --  0.72  --   --  0.56*  --   CALCIUM  8.6*  --   --   --   --   --   --   --  8.8*  --   --  8.6*  --   MG  --   --   --   --   --   --  1.7  --  1.8  --   --   --   --   PHOS  --   --   --   --   --   --  2.7  --  2.9  --   --   --   --    < > = values in this interval not displayed.   GFR: Estimated Creatinine Clearance:  95.2 mL/min (A) (by C-G formula based on SCr of 0.56 mg/dL (L)). Liver Function Tests: No results for input(s): AST, ALT, ALKPHOS, BILITOT, PROT,  ALBUMIN in the last 168 hours. No results for input(s): LIPASE, AMYLASE in the last 168 hours. No results for input(s): AMMONIA in the last 168 hours. Coagulation Profile: No results for input(s): INR, PROTIME in the last 168 hours. Cardiac Enzymes: No results for input(s): CKTOTAL, CKMB, CKMBINDEX, TROPONINI in the last 168 hours. BNP (last 3 results) No results for input(s): PROBNP in the last 8760 hours. HbA1C: No results for input(s): HGBA1C in the last 72 hours. CBG: Recent Labs  Lab 04/06/24 0708 04/06/24 1108 04/06/24 1712 04/06/24 2125 04/07/24 0824  GLUCAP 108* 121* 95 115* 133*   Lipid Profile: No results for input(s): CHOL, HDL, LDLCALC, TRIG, CHOLHDL, LDLDIRECT in the last 72 hours. Thyroid  Function Tests: Recent Labs    04/04/24 2021  TSH 0.598   Anemia Panel: No results for input(s): VITAMINB12, FOLATE, FERRITIN, TIBC, IRON, RETICCTPCT in the last 72 hours. Sepsis Labs: Recent Labs  Lab 04/04/24 1820 04/04/24 2021  PROCALCITON  --  0.12  LATICACIDVEN 1.5  --     Recent Results (from the past 240 hours)  Resp panel by RT-PCR (RSV, Flu A&B, Covid) Anterior Nasal Swab     Status: None   Collection Time: 04/04/24  4:34 PM   Specimen: Anterior Nasal Swab  Result Value Ref Range Status   SARS Coronavirus 2 by RT PCR NEGATIVE NEGATIVE Final   Influenza A by PCR NEGATIVE NEGATIVE Final   Influenza B by PCR NEGATIVE NEGATIVE Final    Comment: (NOTE) The Xpert Xpress SARS-CoV-2/FLU/RSV plus assay is intended as an aid in the diagnosis of influenza from Nasopharyngeal swab specimens and should not be used as a sole basis for treatment. Nasal washings and aspirates are unacceptable for Xpert Xpress SARS-CoV-2/FLU/RSV testing.  Fact Sheet for Patients: BloggerCourse.com  Fact Sheet for Healthcare Providers: SeriousBroker.it  This test is not yet approved or  cleared by the [place]  FDA and has been authorized for detection and/or diagnosis of SARS-CoV-2 by FDA under an Emergency Use Authorization (EUA). This EUA will remain in effect (meaning this test can be used) for the duration of the COVID-19 declaration under Section 564(b)(1) of the Act, 21 U.S.C. section 360bbb-3(b)(1), unless the authorization is terminated or revoked.     Resp Syncytial Virus by PCR NEGATIVE NEGATIVE Final    Comment: (NOTE) Fact Sheet for Patients: BloggerCourse.com  Fact Sheet for Healthcare Providers: SeriousBroker.it  This test is not yet approved or cleared by the [place]  FDA and has been authorized for detection and/or diagnosis of SARS-CoV-2 by FDA under an Emergency Use Authorization (EUA). This EUA will remain in effect (meaning this test can be used) for the duration of the COVID-19 declaration under Section 564(b)(1) of the Act, 21 U.S.C. section 360bbb-3(b)(1), unless the authorization is terminated or revoked.  Performed at [facility], [address] [place], [place] [address]   MRSA Next Gen by PCR, Nasal     Status: None   Collection Time: 04/04/24  9:45 PM  Result Value Ref Range Status   MRSA by PCR Next Gen NOT DETECTED NOT DETECTED Final    Comment: (NOTE) The GeneXpert MRSA Assay (FDA approved for NASAL specimens only), is one component of a comprehensive MRSA colonization surveillance program. It is not intended to diagnose MRSA infection nor to guide or monitor treatment for MRSA infections.  Test performance is not FDA approved in patients less than 63 years old. Performed at [facility], [address] [place], [place] [address]   Blood culture (routine x 2)     Status: None (Preliminary result)   Collection Time: 04/04/24  9:51 PM   Specimen: BLOOD LEFT HAND  Result Value Ref Range Status   Specimen Description BLOOD LEFT HAND  Final   Special  Requests   Final    BOTTLES DRAWN AEROBIC AND ANAEROBIC Blood Culture results may not be optimal due to an inadequate volume of blood received in culture bottles   Culture   Final    NO GROWTH 2 DAYS Performed at [facility], [address] [place], [place] [address]    Report Status PENDING  Incomplete  Blood culture (routine x 2)     Status: None (Preliminary result)   Collection Time: 04/04/24  9:53 PM   Specimen: BLOOD RIGHT HAND  Result Value Ref Range Status   Specimen Description BLOOD RIGHT HAND  Final   Special Requests   Final    BOTTLES DRAWN AEROBIC AND ANAEROBIC Blood Culture adequate volume   Culture  Setup Time   Final    GRAM POSITIVE RODS BOTTLES DRAWN AEROBIC ONLY CRITICAL RESULT CALLED TO, READ BACK BY AND VERIFIED WITH: MAYA CHARLENA SLADE [id number] @ 2225 FH Performed at [facility], [address] [place], [place] [address]    Culture GRAM POSITIVE RODS  Final   Report Status PENDING  Incomplete         Radiology Studies: EEG adult Result Date: 04/05/2024 Shelton Arlin KIDD, MD     04/05/2024  5:14 PM Patient Name: Kinneth Fujiwara MRN: [id number] Epilepsy Attending: Arlin KIDD Shelton Referring Physician/Provider: Claudene Fonda BROCKS, NP Date: 04/05/2024 Duration: 23.04 mins Patient history: 68yo male with ams. EEG to evaluate for seizure Level of alertness: Awake AEDs during EEG study: None Technical aspects: This EEG study was done with scalp electrodes positioned according to the 10-20 International system of electrode placement. Electrical activity was reviewed with band pass filter of 1-70Hz , sensitivity of 7 uV/mm, display speed of 56mm/sec with a 60Hz  notched filter applied as appropriate. EEG data were recorded continuously and digitally stored.  Video monitoring was available and reviewed as appropriate. Description: EEG showed continuous generalized 3 to 5 Hz theta-delta slowing. Physiologic photic driving was not seen during photic stimulation.  Hyperventilation was not performed.   ABNORMALITY - Continuous slow, generalized IMPRESSION: This study is suggestive of moderate diffuse encephalopathy. No seizures or epileptiform discharges were seen throughout the recording. Arlin KIDD Shelton   ECHOCARDIOGRAM COMPLETE Result Date: 04/05/2024    ECHOCARDIOGRAM REPORT   Patient Name:   Rion  Groseclose Date of Exam: 04/05/2024 Medical Rec #:  [id number]       Height:       67.0 in Accession #:    [id number]      Weight:       191.8 lb Date of Birth:  11-09-1955       BSA:          1.987 m Patient Age:    67 years        BP:           143/86 mmHg Patient Gender: M               HR:           59 bpm. Exam Location:  Inpatient Procedure:  2D Echo, Cardiac Doppler and Color Doppler (Both Spectral and Color            Flow Doppler were utilized during procedure). Indications:    Dyspnea  History:        Patient has prior history of Echocardiogram examinations, most                 recent 09/03/2022. Risk Factors:Hypertension.  Sonographer:    Jayson Gaskins Referring Phys: [id number] JOSHUA C SMITH IMPRESSIONS  1. Left ventricular ejection fraction, by estimation, is 60 to 65%. The left ventricle has normal function. The left ventricle has no regional wall motion abnormalities. Left ventricular diastolic parameters were normal.  2. Right ventricular systolic function is normal. The right ventricular size is normal.  3. The mitral valve is normal in structure. No evidence of mitral valve regurgitation. No evidence of mitral stenosis.  4. The aortic valve is tricuspid. Aortic valve regurgitation is not visualized. Aortic valve sclerosis is present, with no evidence of aortic valve stenosis. FINDINGS  Left Ventricle: Left ventricular ejection fraction, by estimation, is 60 to 65%. The left ventricle has normal function. The left ventricle has no regional wall motion abnormalities. The left ventricular internal cavity size was normal in size. There is  no left ventricular  hypertrophy. Left ventricular diastolic parameters were normal. Right Ventricle: The right ventricular size is normal. No increase in right ventricular wall thickness. Right ventricular systolic function is normal. Left Atrium: Left atrial size was normal in size. Right Atrium: Right atrial size was normal in size. Pericardium: There is no evidence of pericardial effusion. Mitral Valve: The mitral valve is normal in structure. No evidence of mitral valve regurgitation. No evidence of mitral valve stenosis. Tricuspid Valve: The tricuspid valve is normal in structure. Tricuspid valve regurgitation is not demonstrated. No evidence of tricuspid stenosis. Aortic Valve: The aortic valve is tricuspid. Aortic valve regurgitation is not visualized. Aortic valve sclerosis is present, with no evidence of aortic valve stenosis. Aortic valve mean gradient measures 2.0 mmHg. Aortic valve peak gradient measures 3.9  mmHg. Aortic valve area, by VTI measures 2.15 cm. Pulmonic Valve: The pulmonic valve was normal in structure. Pulmonic valve regurgitation is not visualized. No evidence of pulmonic stenosis. Aorta: The aortic root is normal in size and structure. Venous: The inferior vena cava was not well visualized. IAS/Shunts: No atrial level shunt detected by color flow Doppler.  LEFT VENTRICLE PLAX 2D LVIDd:         4.70 cm   Diastology LVIDs:         3.00 cm   LV e' medial:    6.09 cm/s LV PW:         1.40 cm   LV E/e' medial:  8.6 LV IVS:        1.00 cm   LV e' lateral:   8.27 cm/s LVOT diam:     2.00 cm   LV E/e' lateral: 6.3 LV SV:         46 LV SV Index:   23 LVOT Area:     3.14 cm  RIGHT VENTRICLE RV S prime:     10.80 cm/s TAPSE (M-mode): 2.3 cm LEFT ATRIUM             Index        RIGHT ATRIUM           Index LA Vol (A2C):   94.9 ml 47.77 ml/m  RA Area:     18.10  cm LA Vol (A4C):   17.8 ml 8.96 ml/m   RA Volume:   44.00 ml  22.15 ml/m LA Biplane Vol: 44.0 ml 22.15 ml/m  AORTIC VALVE AV Area (Vmax):    2.47 cm AV  Area (Vmean):   2.28 cm AV Area (VTI):     2.15 cm AV Vmax:           98.30 cm/s AV Vmean:          73.600 cm/s AV VTI:            0.216 m AV Peak Grad:      3.9 mmHg AV Mean Grad:      2.0 mmHg LVOT Vmax:         77.40 cm/s LVOT Vmean:        53.500 cm/s LVOT VTI:          0.148 m LVOT/AV VTI ratio: 0.69  AORTA Ao Root diam: 2.90 cm MITRAL VALVE MV Area (PHT): 2.47 cm    SHUNTS MV Decel Time: 307 msec    Systemic VTI:  0.15 m MV E velocity: 52.40 cm/s  Systemic Diam: 2.00 cm MV A velocity: 83.80 cm/s MV E/A ratio:  0.63 Morene Brownie Electronically signed by Morene Brownie Signature Date/Time: 04/05/2024/12:23:18 PM    Final         Scheduled Meds:  arformoterol   15 mcg Nebulization BID   budesonide  (PULMICORT ) nebulizer solution  0.25 mg Nebulization BID   busPIRone   5 mg Oral QHS   Chlorhexidine  Gluconate Cloth  6 each Topical Daily   folic acid   1 mg Oral Daily   heparin   5,000 Units Subcutaneous Q8H   melatonin  3 mg Oral QHS   metoprolol  tartrate  50 mg Oral BID   multivitamin with minerals  1 tablet Oral Daily   polyethylene glycol  17 g Oral Daily   revefenacin   175 mcg Nebulization Daily   sodium chloride   1 g Oral BID WC   thiamine   100 mg Oral Daily   Continuous Infusions:  cefTRIAXone  (ROCEPHIN )  IV 2 g (04/06/24 1357)     LOS: 3 days    Time spent: 52 minutes    Renato Applebaum, MD [facility]

## 2024-04-07 NOTE — Plan of Care (Signed)
  Problem: Education: Goal: Knowledge of General Education information will improve Description: Including pain rating scale, medication(s)/side effects and non-pharmacologic comfort measures Outcome: Progressing   Problem: Health Behavior/Discharge Planning: Goal: Ability to manage health-related needs will improve Outcome: Progressing   Problem: Clinical Measurements: Goal: Ability to maintain clinical measurements within normal limits will improve Outcome: Progressing Goal: Will remain free from infection Outcome: Progressing Goal: Diagnostic test results will improve Outcome: Progressing Goal: Respiratory complications will improve Outcome: Progressing Goal: Cardiovascular complication will be avoided Outcome: Progressing   Problem: Activity: Goal: Risk for activity intolerance will decrease Outcome: Progressing   Problem: Nutrition: Goal: Adequate nutrition will be maintained Outcome: Progressing   Problem: Coping: Goal: Level of anxiety will decrease Outcome: Progressing   Problem: Elimination: Goal: Will not experience complications related to bowel motility Outcome: Progressing Goal: Will not experience complications related to urinary retention Outcome: Progressing   Problem: Pain Managment: Goal: General experience of comfort will improve and/or be controlled Outcome: Progressing   Problem: Safety: Goal: Ability to remain free from injury will improve Outcome: Progressing   Problem: Skin Integrity: Goal: Risk for impaired skin integrity will decrease Outcome: Progressing   Problem: Activity: Goal: Ability to tolerate increased activity will improve Outcome: Progressing   Problem: Respiratory: Goal: Ability to maintain a clear airway and adequate ventilation will improve Outcome: Progressing   Problem: Role Relationship: Goal: Method of communication will improve Outcome: Progressing   Problem: Safety: Goal: Non-violent Restraint(s) Outcome:  Progressing

## 2024-04-07 NOTE — Progress Notes (Signed)
 Per MD order RN attempted to call lab tech at [phone] per main lab request for timed medication draw no response. Awaiting lab to administer medication as ordered.

## 2024-04-08 DIAGNOSIS — J9601 Acute respiratory failure with hypoxia: Secondary | ICD-10-CM | POA: Diagnosis not present

## 2024-04-08 LAB — BASIC METABOLIC PANEL WITH GFR
Anion gap: 11 (ref 5–15)
BUN: 5 mg/dL — ABNORMAL LOW (ref 8–23)
CO2: 32 mmol/L (ref 22–32)
Calcium: 9.1 mg/dL (ref 8.9–10.3)
Chloride: 91 mmol/L — ABNORMAL LOW (ref 98–111)
Creatinine, Ser: 0.55 mg/dL — ABNORMAL LOW (ref 0.61–1.24)
GFR, Estimated: >60 mL/min (ref >60)
Glucose, Bld: 126 mg/dL — ABNORMAL HIGH (ref 70–99)
Potassium: 4.2 mmol/L (ref 3.5–5.1)
Sodium: 134 mmol/L — ABNORMAL LOW (ref 135–145)

## 2024-04-08 LAB — GLUCOSE, CAPILLARY
Glucose-Capillary: 162 mg/dL — ABNORMAL HIGH (ref 70–99)
Glucose-Capillary: 107 mg/dL — ABNORMAL HIGH (ref 70–99)

## 2024-04-08 MED ORDER — ENOXAPARIN SODIUM 40 MG/0.4ML IJ SOSY: 40.0000 mg | PREFILLED_SYRINGE | INTRAMUSCULAR | Status: DC

## 2024-04-08 MED ORDER — AMOXICILLIN-POT CLAVULANATE 875-125 MG PO TABS: 1.0000 | ORAL_TABLET | Freq: Two times a day (BID) | ORAL | Status: AC

## 2024-04-08 MED ORDER — POLYETHYLENE GLYCOL 3350 17 G PO PACK: 17.0000 g | PACK | Freq: Every day | ORAL | Status: AC | PRN

## 2024-04-08 NOTE — Progress Notes (Signed)
 Lab alerted RN of hemolyzed CMP. New order for recollect placed by lab. RN attempted to notify lab technician via phone. Awaiting redraw at this time.

## 2024-04-08 NOTE — Plan of Care (Signed)
  Problem: Education: Goal: Knowledge of General Education information will improve Description: Including pain rating scale, medication(s)/side effects and non-pharmacologic comfort measures Outcome: Progressing   Problem: Health Behavior/Discharge Planning: Goal: Ability to manage health-related needs will improve Outcome: Progressing   Problem: Clinical Measurements: Goal: Ability to maintain clinical measurements within normal limits will improve Outcome: Progressing Goal: Will remain free from infection Outcome: Progressing Goal: Diagnostic test results will improve Outcome: Progressing Goal: Respiratory complications will improve Outcome: Progressing Goal: Cardiovascular complication will be avoided Outcome: Progressing   Problem: Activity: Goal: Risk for activity intolerance will decrease Outcome: Progressing   Problem: Nutrition: Goal: Adequate nutrition will be maintained Outcome: Progressing   Problem: Coping: Goal: Level of anxiety will decrease Outcome: Progressing   Problem: Elimination: Goal: Will not experience complications related to bowel motility Outcome: Progressing Goal: Will not experience complications related to urinary retention Outcome: Progressing   Problem: Pain Managment: Goal: General experience of comfort will improve and/or be controlled Outcome: Progressing   Problem: Safety: Goal: Ability to remain free from injury will improve Outcome: Progressing   Problem: Skin Integrity: Goal: Risk for impaired skin integrity will decrease Outcome: Progressing   Problem: Activity: Goal: Ability to tolerate increased activity will improve Outcome: Progressing   Problem: Respiratory: Goal: Ability to maintain a clear airway and adequate ventilation will improve Outcome: Progressing   Problem: Role Relationship: Goal: Method of communication will improve Outcome: Progressing   Problem: Safety: Goal: Non-violent Restraint(s) Outcome:  Progressing

## 2024-04-08 NOTE — Discharge Summary (Addendum)
 Physician Discharge Summary   Patient: Ronald [name] MRN: [id number] DOB: Jan 30, 1956  Admit date:     04/04/2024  Discharge date: 04/08/24  Discharge Physician: Ronald [name]   PCP: Ronald Annabella SAILOR, FNP   Recommendations at discharge:  Follow-up with PCP  Discharge Diagnoses: Principal Problem:   Hypercapnic respiratory failure (HCC)  Resolved Problems:   * No resolved hospital problems. *  Hospital Course:  68 year old with history of COPD, hypertension, peripheral vascular disease, previous history of alcohol  use, hypertension, gout, bilateral BKA who is a long-term nursing home resident at [facility] skilled nursing facility brought to the emergency room for shortness of breath, he was found lethargic and oxygen saturation 70% on 3 L oxygen.  Apparently when EMS arrived, patient was not on nasal cannula oxygen.  He was given DuoNebs, put back on oxygen, blood glucose was 68.  Patient was lethargic on arrival to ER with sodium 113, potassium 4, chloride 75.  VBG with pH 7.22, [id number].  Chest x-ray with heterogenous groundglass at bilateral bases.  Admitted to ICU due to hyponatremia and altered mental status.   Significant events 7/23, admitted to ICU with hyponatremia, respiratory failure requiring intubation 7/24, mental status improved, extubated and now back on nasal cannula oxygen. 7/26, transferred to medical floor.   Assessment & Plan:   Acute on chronic hypoxemic and hypercapnic respiratory failure secondary to altered mental status COPD with chronic hypoxemia, on 3 L oxygen at baseline Suspected pneumonia   Patient was intubated for airway protection and unable to trigger BiPAP in the emergency room. Extubated to nasal cannula oxygen on 04/05/2024.  Keep on oxygen to keep saturation more than 90%. Mental status has improved and resolved. Patient on  Rocephin , continue today.   And transitioned to oral antibiotics to complete a total of 5 days. Blood cultures, 1  bottle with gram-positive rods that is likely contaminant.   COVID, RSV, flu negative. Continue Brovana , Pulmicort , Yupelri .    Altered mental status, acute metabolic encephalopathy: Secondary to hypercarbia, hyponatremia and multifactorial. CT head negative.  No focal deficits.  Improved with correction of sodium.   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  Patient presented with sodium of 115-isotonic saline.  Similar presentation month ago.  TSH normal.    Encouraged full meals, no sodium restriction, however water restrictions to 1500 mL/day.   Patient is medically stabilized.  And therefore being discharged today back to long-term care nursing home.  Patient is agreeable.   Consultants: Pulmonary and critical care Procedures performed: As mentioned above Disposition: Skilled nursing facility Diet recommendation:   regular diet DISCHARGE MEDICATION: Allergies as of 04/08/2024   No Known Allergies      Medication List     TAKE these medications    acetaminophen  500 MG tablet Commonly known as: TYLENOL  Take 1,000 mg by mouth 2 (two) times daily. What changed: Another medication with the same name was removed. Continue taking this medication, and follow the directions you see here.   amLODipine  10 MG tablet Commonly known as: NORVASC  Take 10 mg by mouth daily.   amoxicillin -clavulanate 875-125 MG tablet Commonly known as: AUGMENTIN  Take 1 tablet by mouth 2 (two) times daily for 3 days.   busPIRone  5 MG tablet Commonly known as: BUSPAR  Take 5 mg by mouth at bedtime.   cetirizine 10 MG tablet Commonly known as: ZYRTEC Take 10 mg by mouth daily.   cholecalciferol 25 MCG (1000 UNIT) tablet  Commonly known as: VITAMIN D3 Take 2,000 Units by mouth daily.   docusate sodium  100 MG capsule Commonly known as: COLACE Take 100 mg by mouth daily.   fluticasone  50 MCG/ACT nasal  spray Commonly known as: FLONASE Place 1 spray into both nostrils daily.   fluticasone -salmeterol 250-50 MCG/ACT Aepb Commonly known as: ADVAIR  Inhale 1 puff into the lungs 2 (two) times daily.   guaiFENesin -dextromethorphan  100-10 MG/5ML syrup Commonly known as: ROBITUSSIN DM Take 10 mLs by mouth every 6 (six) hours as needed for cough.   ipratropium-albuterol  0.5-2.5 (3) MG/3ML Soln Commonly known as: DUONEB Take 3 mLs by nebulization See admin instructions. Inhale 1 vial via nebulizer twice daily (scheduled). In addition, inhale 1 vial every 6 hours as needed for shortness of breath or wheezing.   melatonin 3 MG Tabs tablet Take 3 mg by mouth at bedtime.   metoprolol  tartrate 50 MG tablet Commonly known as: LOPRESSOR  Take 1 tablet (50 mg total) by mouth 2 (two) times daily.   OXYGEN Inhale 3 L/min into the lungs continuous.   pantoprazole  40 MG tablet Commonly known as: PROTONIX  Take 1 tablet (40 mg total) by mouth daily.   polyethylene glycol 17 g packet Commonly known as: MIRALAX  / GLYCOLAX  Take 17 g by mouth daily as needed for moderate constipation.   thiamine  100 MG tablet Commonly known as: VITAMIN B1 Take 1 tablet (100 mg total) by mouth daily.        Follow-up Information     Ronald Annabella SAILOR, FNP. Call.   Specialty: Family Medicine Contact information: [address] [place] [address] [phone]                Discharge Exam: Ronald [name]   04/06/24 0500 04/07/24 0320 04/08/24 0447  Weight: 83.6 kg 86.2 kg 84.8 kg   General: Alert awake and oriented.  Fairly comfortable and interactive. Cardiovascular: S1-S2 normal.  Regular rate rhythm. Respiratory: Bilateral clear.  No added sounds.  On 3 L oxygen. Gastrointestinal: Soft.  Nontender.  Bowel sound present. Ext: No swelling or edema.  No cyanosis.  He has bilateral BKA stumps that are clean and dry. Neuro: Alert awake.  Condition at discharge: good  The results of  significant diagnostics from this hospitalization (including imaging, microbiology, ancillary and laboratory) are listed below for reference.   Imaging Studies: EEG adult Result Date: 04/05/2024 Shelton Arlin KIDD, MD     04/05/2024  5:14 PM Patient Name: Desiree  Menzer MRN: [id number] Epilepsy Attending: Arlin KIDD Shelton Referring Physician/Provider: Claudene Fonda BROCKS, NP Date: 04/05/2024 Duration: 23.04 mins Patient history: 68yo male with ams. EEG to evaluate for seizure Level of alertness: Awake AEDs during EEG study: None Technical aspects: This EEG study was done with scalp electrodes positioned according to the 10-20 International system of electrode placement. Electrical activity was reviewed with band pass filter of 1-70Hz , sensitivity of 7 uV/mm, display speed of 84mm/sec with a 60Hz  notched filter applied as appropriate. EEG data were recorded continuously and digitally stored.  Video monitoring was available and reviewed as appropriate. Description: EEG showed continuous generalized 3 to 5 Hz theta-delta slowing. Physiologic photic driving was not seen during photic stimulation. Hyperventilation was not performed.   ABNORMALITY - Continuous slow, generalized IMPRESSION: This study is suggestive of moderate diffuse encephalopathy. No seizures or epileptiform discharges were seen throughout the recording. Arlin KIDD Shelton   ECHOCARDIOGRAM COMPLETE Result Date: 04/05/2024    ECHOCARDIOGRAM REPORT   Patient Name:   Jack  Harden Date  of Exam: 04/05/2024 Medical Rec #:  [id number]       Height:       67.0 in Accession #:    [id number]      Weight:       191.8 lb Date of Birth:  1956/08/24       BSA:          1.987 m Patient Age:    67 years        BP:           143/86 mmHg Patient Gender: M               HR:           59 bpm. Exam Location:  Inpatient Procedure: 2D Echo, Cardiac Doppler and Color Doppler (Both Spectral and Color            Flow Doppler were utilized during procedure). Indications:     Dyspnea  History:        Patient has prior history of Echocardiogram examinations, most                 recent 09/03/2022. Risk Factors:Hypertension.  Sonographer:    Jayson Gaskins Referring Phys: [id number] JOSHUA C SMITH IMPRESSIONS  1. Left ventricular ejection fraction, by estimation, is 60 to 65%. The left ventricle has normal function. The left ventricle has no regional wall motion abnormalities. Left ventricular diastolic parameters were normal.  2. Right ventricular systolic function is normal. The right ventricular size is normal.  3. The mitral valve is normal in structure. No evidence of mitral valve regurgitation. No evidence of mitral stenosis.  4. The aortic valve is tricuspid. Aortic valve regurgitation is not visualized. Aortic valve sclerosis is present, with no evidence of aortic valve stenosis. FINDINGS  Left Ventricle: Left ventricular ejection fraction, by estimation, is 60 to 65%. The left ventricle has normal function. The left ventricle has no regional wall motion abnormalities. The left ventricular internal cavity size was normal in size. There is  no left ventricular hypertrophy. Left ventricular diastolic parameters were normal. Right Ventricle: The right ventricular size is normal. No increase in right ventricular wall thickness. Right ventricular systolic function is normal. Left Atrium: Left atrial size was normal in size. Right Atrium: Right atrial size was normal in size. Pericardium: There is no evidence of pericardial effusion. Mitral Valve: The mitral valve is normal in structure. No evidence of mitral valve regurgitation. No evidence of mitral valve stenosis. Tricuspid Valve: The tricuspid valve is normal in structure. Tricuspid valve regurgitation is not demonstrated. No evidence of tricuspid stenosis. Aortic Valve: The aortic valve is tricuspid. Aortic valve regurgitation is not visualized. Aortic valve sclerosis is present, with no evidence of aortic valve stenosis. Aortic valve  mean gradient measures 2.0 mmHg. Aortic valve peak gradient measures 3.9  mmHg. Aortic valve area, by VTI measures 2.15 cm. Pulmonic Valve: The pulmonic valve was normal in structure. Pulmonic valve regurgitation is not visualized. No evidence of pulmonic stenosis. Aorta: The aortic root is normal in size and structure. Venous: The inferior vena cava was not well visualized. IAS/Shunts: No atrial level shunt detected by color flow Doppler.  LEFT VENTRICLE PLAX 2D LVIDd:         4.70 cm   Diastology LVIDs:         3.00 cm   LV e' medial:    6.09 cm/s LV PW:         1.40 cm   LV E/e' medial:  8.6 LV IVS:        1.00 cm   LV e' lateral:   8.27 cm/s LVOT diam:     2.00 cm   LV E/e' lateral: 6.3 LV SV:         46 LV SV Index:   23 LVOT Area:     3.14 cm  RIGHT VENTRICLE RV S prime:     10.80 cm/s TAPSE (M-mode): 2.3 cm LEFT ATRIUM             Index        RIGHT ATRIUM           Index LA Vol (A2C):   94.9 ml 47.77 ml/m  RA Area:     18.10 cm LA Vol (A4C):   17.8 ml 8.96 ml/m   RA Volume:   44.00 ml  22.15 ml/m LA Biplane Vol: 44.0 ml 22.15 ml/m  AORTIC VALVE AV Area (Vmax):    2.47 cm AV Area (Vmean):   2.28 cm AV Area (VTI):     2.15 cm AV Vmax:           98.30 cm/s AV Vmean:          73.600 cm/s AV VTI:            0.216 m AV Peak Grad:      3.9 mmHg AV Mean Grad:      2.0 mmHg LVOT Vmax:         77.40 cm/s LVOT Vmean:        53.500 cm/s LVOT VTI:          0.148 m LVOT/AV VTI ratio: 0.69  AORTA Ao Root diam: 2.90 cm MITRAL VALVE MV Area (PHT): 2.47 cm    SHUNTS MV Decel Time: 307 msec    Systemic VTI:  0.15 m MV E velocity: 52.40 cm/s  Systemic Diam: 2.00 cm MV A velocity: 83.80 cm/s MV E/A ratio:  0.63 Morene Brownie Electronically signed by Morene Brownie Signature Date/Time: 04/05/2024/12:23:18 PM    Final    DG Chest Portable 1 View Result Date: 04/04/2024 CLINICAL DATA:  og verification; [phone] Encounter for orogastric (OG) tube placement [id number] EXAM: PORTABLE CHEST 1 VIEW COMPARISON:  Chest x-ray  04/04/2024, x-ray abdomen 02/17/2024 FINDINGS: Enteric tube courses below the hemidiaphragm with tip and side port overlying the expected region the gastric lumen. Endotracheal tube 1 cm above the carina. The heart and mediastinal contours are unchanged. Atherosclerotic plaque. No focal consolidation. No pulmonary edema. Likely bilateral trace pleural effusions. No pneumothorax. Gaseous dilatation of a couple loops of small bowel within the left mid abdomen. No acute osseous abnormality. IMPRESSION: 1. Likely bilateral trace pleural effusions. 2. Gaseous dilatation of a couple loops of small bowel within the left mid abdomen. 3. Endotracheal tube 1 cm above the carina. Recommend retraction by 1 cm. 4. Enteric tube in good position. These results will be called to the ordering clinician or representative by the Radiologist Assistant, and communication documented in the PACS or [employer]. Electronically Signed   By: Morgane  Naveau M.D.   On: 04/04/2024 21:18   DG Abd 1 View Result Date: 04/04/2024 CLINICAL DATA:  og verification; [id number] Encounter for orogastric (OG) tube placement [id number] EXAM: PORTABLE CHEST 1 VIEW COMPARISON:  Chest x-ray 04/04/2024, x-ray abdomen 02/17/2024 FINDINGS: Enteric tube courses below the hemidiaphragm with tip and side port overlying the expected region the gastric lumen. Endotracheal tube 1 cm above the carina. The heart and mediastinal contours  are unchanged. Atherosclerotic plaque. No focal consolidation. No pulmonary edema. Likely bilateral trace pleural effusions. No pneumothorax. Gaseous dilatation of a couple loops of small bowel within the left mid abdomen. No acute osseous abnormality. IMPRESSION: 1. Likely bilateral trace pleural effusions. 2. Gaseous dilatation of a couple loops of small bowel within the left mid abdomen. 3. Endotracheal tube 1 cm above the carina. Recommend retraction by 1 cm. 4. Enteric tube in good position. These results will be called to the  ordering clinician or representative by the Radiologist Assistant, and communication documented in the PACS or [employer]. Electronically Signed   By: Morgane  Naveau M.D.   On: 04/04/2024 21:18   CT HEAD WO CONTRAST ( ) Result Date: 04/04/2024 CLINICAL DATA:  Altered mental status. EXAM: CT HEAD WITHOUT CONTRAST TECHNIQUE: Contiguous axial images were obtained from the base of the skull through the vertex without intravenous contrast. RADIATION DOSE REDUCTION: This exam was performed according to the departmental dose-optimization program which includes automated exposure control, adjustment of the mA and/or kV according to patient size and/or use of iterative reconstruction technique. COMPARISON:  09/19/2022 FINDINGS: Brain: No evidence of acute infarction, hemorrhage, hydrocephalus, extra-axial collection or mass lesion/mass effect. Stable degree of atrophy. Periventricular white matter hypodensity consistent with chronic small vessel ischemia. Remote lacunar infarcts in the basal ganglia. Vascular: Atherosclerosis of skullbase vasculature without hyperdense vessel or abnormal calcification. Skull: No fracture or focal lesion. Sinuses/Orbits: Chronic defect of the right lamina papyracea. No acute findings. Other: None. IMPRESSION: 1. No acute intracranial abnormality. 2. Stable atrophy, chronic small vessel ischemia, and remote lacunar infarcts. Electronically Signed   By: Andrea Gasman M.D.   On: 04/04/2024 19:59   DG Chest Port 1 View Result Date: 04/04/2024 CLINICAL DATA:  Shortness of breath EXAM: PORTABLE CHEST 1 VIEW COMPARISON:  02/20/2024 FINDINGS: Hypoventilatory changes. Cardiomegaly with vascular congestion. Heterogeneous ground-glass disease at the bases. No pleural effusion or pneumothorax. Aortic atherosclerosis IMPRESSION: Cardiomegaly with vascular congestion. Heterogeneous ground-glass disease at the bases, possible pneumonia. Electronically Signed   By: Luke Bun M.D.   On:  04/04/2024 16:57    Microbiology: Results for orders placed or performed during the hospital encounter of 04/04/24  Resp panel by RT-PCR (RSV, Flu A&B, Covid) Anterior Nasal Swab     Status: None   Collection Time: 04/04/24  4:34 PM   Specimen: Anterior Nasal Swab  Result Value Ref Range Status   SARS Coronavirus 2 by RT PCR NEGATIVE NEGATIVE Final   Influenza A by PCR NEGATIVE NEGATIVE Final   Influenza B by PCR NEGATIVE NEGATIVE Final    Comment: (NOTE) The Xpert Xpress SARS-CoV-2/FLU/RSV plus assay is intended as an aid in the diagnosis of influenza from Nasopharyngeal swab specimens and should not be used as a sole basis for treatment. Nasal washings and aspirates are unacceptable for Xpert Xpress SARS-CoV-2/FLU/RSV testing.  Fact Sheet for Patients: BloggerCourse.com  Fact Sheet for Healthcare Providers: SeriousBroker.it  This test is not yet approved or cleared by the [place]  FDA and has been authorized for detection and/or diagnosis of SARS-CoV-2 by FDA under an Emergency Use Authorization (EUA). This EUA will remain in effect (meaning this test can be used) for the duration of the COVID-19 declaration under Section 564(b)(1) of the Act, 21 U.S.C. section 360bbb-3(b)(1), unless the authorization is terminated or revoked.     Resp Syncytial Virus by PCR NEGATIVE NEGATIVE Final    Comment: (NOTE) Fact Sheet for Patients: BloggerCourse.com  Fact Sheet for Healthcare Providers: SeriousBroker.it  This test is not yet approved or cleared by the [place]  FDA and has been authorized for detection and/or diagnosis of SARS-CoV-2 by FDA under an Emergency Use Authorization (EUA). This EUA will remain in effect (meaning this test can be used) for the duration of the COVID-19 declaration under Section 564(b)(1) of the Act, 21 U.S.C. section 360bbb-3(b)(1), unless the  authorization is terminated or revoked.  Performed at [facility], [address] [place], [place] [address]   MRSA Next Gen by PCR, Nasal     Status: None   Collection Time: 04/04/24  9:45 PM  Result Value Ref Range Status   MRSA by PCR Next Gen NOT DETECTED NOT DETECTED Final    Comment: (NOTE) The GeneXpert MRSA Assay (FDA approved for NASAL specimens only), is one component of a comprehensive MRSA colonization surveillance program. It is not intended to diagnose MRSA infection nor to guide or monitor treatment for MRSA infections. Test performance is not FDA approved in patients less than 36 years old. Performed at [facility], [address], [place], [place] [address]   Blood culture (routine x 2)     Status: None (Preliminary result)   Collection Time: 04/04/24  9:51 PM   Specimen: BLOOD LEFT HAND  Result Value Ref Range Status   Specimen Description BLOOD LEFT HAND  Final   Special Requests   Final    BOTTLES DRAWN AEROBIC AND ANAEROBIC Blood Culture results may not be optimal due to an inadequate volume of blood received in culture bottles   Culture   Final    NO GROWTH 4 DAYS Performed at [facility], [address] [place], [place] [address]    Report Status PENDING  Incomplete  Blood culture (routine x 2)     Status: None (Preliminary result)   Collection Time: 04/04/24  9:53 PM   Specimen: BLOOD RIGHT HAND  Result Value Ref Range Status   Specimen Description BLOOD RIGHT HAND  Final   Special Requests   Final    BOTTLES DRAWN AEROBIC AND ANAEROBIC Blood Culture adequate volume   Culture  Setup Time   Final    GRAM POSITIVE RODS BOTTLES DRAWN AEROBIC ONLY CRITICAL RESULT CALLED TO, READ BACK BY AND VERIFIED WITH: MAYA CHARLENA SLADE [id number] @ 2225 FH    Culture   Final    GRAM POSITIVE RODS IDENTIFICATION TO FOLLOW Performed at [facility], [address], [place], [place] [address]    Report Status PENDING  Incomplete     Labs: CBC: Recent Labs  Lab 04/04/24 1638 04/04/24 1643 04/04/24 2021 04/04/24 2251 04/06/24 0124 04/07/24 0605  WBC 18.0*  --  14.5*  --  17.0* 12.8*  NEUTROABS 15.1*  --   --   --   --   --   HGB 13.4 16.0 13.7 14.3 13.4 14.0  HCT 39.6 47.0 40.6 42.0 38.8* 42.2  MCV 87.2  --  86.6  --  85.7 88.8  PLT 418*  --  400  --  379 411*   Basic Metabolic Panel: Recent Labs  Lab 04/04/24 1638 04/04/24 1643 04/04/24 2021 04/04/24 2151 04/04/24 2251 04/05/24 0320 04/05/24 1800 04/05/24 2123 04/06/24 0124 04/06/24 0827 04/06/24 2003 04/07/24 0605 04/07/24 0721 04/08/24 1024  NA 115* 113* 117*   < > 114*   < > 126*   < > 125* 127* 129* 130* 130* 134*  K 3.9 4.0  --   --  4.4  --   --   --  4.2  --   --  3.8  --  4.2  CL 75*  --   --   --   --   --   --   --  85*  --   --  90*  --  91*  CO2 30  --   --   --   --   --   --   --  29  --   --  28  --  32  GLUCOSE 97  --   --   --   --   --   --   --  107*  --   --  93  --  126*  BUN 9  --   --   --   --   --   --   --  14  --   --  6*  --  5*  CREATININE 0.54*  --  0.62  --   --   --   --   --  0.72  --   --  0.56*  --  0.55*  CALCIUM  8.6*  --   --   --   --   --   --   --  8.8*  --   --  8.6*  --  9.1  MG  --   --   --   --   --   --  1.7  --  1.8  --   --   --   --   --   PHOS  --   --   --   --   --   --  2.7  --  2.9  --   --   --   --   --    < > = values in this interval not displayed.   Liver Function Tests: No results for input(s): AST, ALT, ALKPHOS, BILITOT, PROT, ALBUMIN in the last 168 hours. CBG: Recent Labs  Lab 04/07/24 0824 04/07/24 1157 04/07/24 1552 04/08/24 0836 04/08/24 1156  GLUCAP 133* 130* 121* 162* 107*    Discharge time spent:  37 minutes.  Signed: Drue ONEIDA Potter, MD [facility] 04/08/2024

## 2024-04-08 NOTE — TOC Transition Note (Signed)
 Transition of Care [facility]) - Discharge Note   Patient Details  Name: Ronald [name]  Alvidrez MRN: [id number] Date of Birth: 08-Sep-1956  Transition of Care [facility]) CM/SW Contact:  Gwenn Julien [place], [place] Phone Number: 04/08/2024, 3:13 PM   Clinical Narrative: Pt for dc back to [facility] where he is a LTC resident. Spoke to [place] at [facility] who confirmed they are prepared to admit pt back to room 222B. Pt and pt's sister [name] aware of dc and report agreeable. RN provided with number for report and PTAR arranged for transport. SW signing off at dc.   Julien Gwenn, MSW, LCSW [phone] (coverage)      Final next level of care: Skilled Nursing Facility Barriers to Discharge: Barriers Resolved   Patient Goals and CMS Choice   CMS Medicare.gov Compare Post Acute Care list provided to:: Patient Choice offered to / list presented to : Patient [name] ownership interest in [facility].provided to:: Patient    Discharge Placement              Patient chooses bed at: Other - please specify in the comment section below: [facility]) Patient to be transferred to facility by: PTAR Name of family member notified: [name]/sister Patient and family notified of of transfer: 04/08/24  Discharge Plan and Services Additional resources added to the After Visit Summary for   In-house Referral: Clinical Social Work   Post Acute Care Choice: Skilled Nursing Facility                               Social Drivers of Health (SDOH) Interventions SDOH Screenings   Food Insecurity: No Food Insecurity (04/04/2024)  Housing: Low Risk  (04/04/2024)  Transportation Needs: No Transportation Needs (04/04/2024)  Utilities: Not At Risk (04/04/2024)  Financial Resource Strain: Not on File (07/30/2022)   Received from [facility]  Physical Activity: Not on File (12/31/2021)   Received from [facility]  Social Connections: Patient Unable To Answer (04/04/2024)  Stress: Not on File  (12/31/2021)   Received from [facility]  Tobacco Use: High Risk (04/04/2024)     Readmission Risk Interventions    02/20/2024   11:45 AM  Readmission Risk Prevention Plan  Transportation Screening Complete  PCP or Specialist Appt within 5-7 Days Complete  Home Care Screening Complete  Medication Review (RN CM) Complete

## 2024-04-09 LAB — CULTURE, BLOOD (ROUTINE X 2)
Culture: NO GROWTH
Special Requests: ADEQUATE

## 2024-08-07 ENCOUNTER — Ambulatory Visit: Admitting: Internal Medicine

## 2024-08-07 ENCOUNTER — Encounter: Payer: Self-pay | Admitting: Internal Medicine

## 2024-08-07 VITALS — BP 140/78 | HR 77

## 2024-08-07 DIAGNOSIS — J9611 Chronic respiratory failure with hypoxia: Secondary | ICD-10-CM | POA: Diagnosis not present

## 2024-08-07 DIAGNOSIS — F17210 Nicotine dependence, cigarettes, uncomplicated: Secondary | ICD-10-CM

## 2024-08-07 DIAGNOSIS — R0609 Other forms of dyspnea: Secondary | ICD-10-CM | POA: Insufficient documentation

## 2024-08-07 DIAGNOSIS — Z23 Encounter for immunization: Secondary | ICD-10-CM

## 2024-08-07 MED ORDER — METHYLPREDNISOLONE ACETATE 80 MG/ML IJ SUSP
120.0000 mg | Freq: Once | INTRAMUSCULAR | Status: AC
  Administered 2024-08-07: 120 mg via INTRAMUSCULAR

## 2024-08-07 NOTE — Assessment & Plan Note (Addendum)
 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

## 2024-08-07 NOTE — Patient Instructions (Addendum)
 Recommendations: D/c BREO Increase duoneb to qid and add budesonide  0.25 mg to first dose in am and pm dose (twice daily ) Omeprazole  40 mg Take 30-60 min before first meal of the day and pepcid  20 mg q pm No mint or menthol products  - use hard rock candy like jolly ranchers or lifesavers but not the white ones. Make sure   oxygen saturation  AT   highest level of activity (not after you stop)   to be sure it stays over 90% and adjust  02 flow upward to maintain this level if needed but remember to turn it back to previous settings when you stop (to conserve your supply).  Depomedrol 120 mg IM  Triple flu vaccine                     Stop all smoking if possible   F/u in 4 weeks - call sooner if needed

## 2024-08-07 NOTE — Addendum Note (Signed)
 Addended by: Shandrika Ambers M on: 08/07/2024 10:58 AM   Modules accepted: Orders

## 2024-08-07 NOTE — Assessment & Plan Note (Addendum)
?   02 dep s/p R BKA  but does not use 02 when propelling arm chair in hallway as of 08/07/2024   Rec 08/07/2024  = titrate to keep sats > 90%  at all levels of activity   F/u in 4 weeks, sooner if needed (low threshold to see ENT for ? Tracheal stenosis post ET

## 2024-08-07 NOTE — Progress Notes (Signed)
 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  Pt states dx with COPD approx 5 years ago. He c/o cough- non prod and wheezing.   Dyspnea:  up and down s 02  Cough: no production  Sleep: feels strangled at night 10-20 degrees  SABA use: duoneb once a day  02 use:3 lpm LDSCT 02/2024 :no nodules, pos emphysema   No obvious day to day or daytime pattern/variability or assoc excess/ purulent sputum or mucus plugs or hemoptysis or cp or chest tightness, subjective wheeze or overt sinus or hb symptoms.    Also denies any obvious fluctuation of symptoms with weather or environmental changes or other aggravating or alleviating factors except as outlined above   No unusual exposure hx or h/o childhood pna/ asthma or knowledge of premature birth.  Current Allergies, Complete Past Medical History, Past Surgical History, Family History, and Social History were reviewed in [employer] record.  ROS  The following are not active complaints unless bolded Hoarseness, sore throat, dysphagia, dental problems, itching, sneezing,  nasal congestion or discharge of excess mucus or purulent secretions, ear ache,   fever, chills, sweats, unintended wt loss or wt gain, classically pleuritic or exertional cp,  orthopnea pnd or arm/hand swelling  or leg swelling, presyncope, palpitations, abdominal pain, anorexia, nausea, vomiting, diarrhea  or change in bowel habits or change in bladder habits, change in stools or change  in urine, dysuria, hematuria,  rash, arthralgias, visual complaints, headache, numbness, weakness or ataxia or problems with walking or coordination,  change in mood or  memory.             Outpatient Medications Prior to Visit  Medication Sig Dispense Refill   acetaminophen  (TYLENOL ) 500 MG tablet Take 1,000 mg by mouth 2 (two) times daily.     amLODipine  (NORVASC ) 10 MG tablet Take 10 mg by mouth daily.     benzonatate (TESSALON) 200 MG capsule Take 200 mg by mouth 3 (three) times daily as needed for cough.     BREO ELLIPTA  100-25 MCG/ACT AEPB Inhale 1 puff into the lungs daily.     busPIRone  (BUSPAR ) 5 MG tablet Take 5 mg by mouth at bedtime.     cetirizine (ZYRTEC) 10 MG tablet Take 10 mg by mouth daily.     cholecalciferol (VITAMIN D3) 25 MCG (1000 UNIT) tablet Take 2,000 Units by mouth daily.     docusate sodium  (COLACE) 100 MG capsule Take 100 mg by mouth daily.     fluticasone  (FLONASE) 50 MCG/ACT nasal spray Place 1 spray into both nostrils daily.     guaiFENesin -dextromethorphan  (ROBITUSSIN DM) 100-10 MG/5ML syrup Take 10 mLs by mouth every 6 (six) hours as needed for cough.     ipratropium-albuterol  (DUONEB) 0.5-2.5 (3) MG/3ML SOLN Take 3 mLs by nebulization See admin instructions. Inhale 1 vial via nebulizer twice daily (scheduled). In addition, inhale 1 vial every 6 hours as needed  for shortness of breath or wheezing.     melatonin 3 MG TABS tablet Take 3 mg by mouth at bedtime.     metoprolol  tartrate (LOPRESSOR ) 50 MG tablet Take 1 tablet (50 mg total) by mouth 2 (two) times daily.     OXYGEN Inhale 3 L/min into the lungs continuous.     pantoprazole  (PROTONIX ) 40 MG tablet Take 1 tablet (40 mg total) by mouth daily. 30 tablet 1   polyethylene glycol (MIRALAX  / GLYCOLAX ) 17 g packet Take 17 g by mouth daily as needed for moderate constipation.     thiamine  (VITAMIN B1) 100 MG tablet Take 1 tablet (100 mg total) by mouth daily. 30 tablet 0   fluticasone -salmeterol (ADVAIR )  250-50 MCG/ACT AEPB Inhale 1 puff into the lungs 2 (two) times daily.     No facility-administered medications prior to visit.    Past Medical History:  Diagnosis Date   Critical lower limb ischemia (HCC)    ETOH abuse    Gout    Hypertension       Objective:     BP (!) 140/78   Pulse 77   SpO2 100% Comment: 2lpm cont o2.  SpO2: 100 % (2lpm cont o2.)  W/c bound hoarse bm nad    HEENT : Oropharynx  clear      NECK :  without  apparent JVD/ palpable Nodes/TM - very prominent pseudowheeze    LUNGS: no acc muscle use,  Min barrel  contour chest wall with bilateral  slightly decreased bs s audible wheeze and  without cough on insp or exp maneuvers and min  Hyperresonant  to  percussion bilaterally    CV:  RRR  no s3 or murmur or increase in P2, and no edema   ABD:  soft and nontender    MS:  Nl gait/ ext warm without deformities Or obvious joint restrictions  calf tenderness, cyanosis or clubbing     SKIN: warm and dry without lesions    NEURO:  alert, approp, nl sensorium with  no motor or cerebellar deficits apparent.              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 02 dep s/p R BKA  but does not use 02 when propelling arm chair in hallway as of 08/07/2024   Rec 08/07/2024  = titrate to keep sats > 90%  at all levels of activity   F/u in 4 weeks, sooner if needed (low threshold to see ENT for ? Tracheal stenosis post ET   Cigarette smoker Counseled re importance of smoking cessation but did not meet time criteria for separate billing    Low-dose CT lung cancer screening is  recommended for patients who are 21-55 years of age with a 20+ pack-year history of smoking and who are currently smoking or quit <=15 years ago. No coughing up blood  No unintentional weight loss of > 15 pounds in the last 6 months - pt is eligible for scanning yearly until age 70   >>>CT 02/17/2024 :no nodules, pos  emphysema so LDSCT  due q June           Each maintenance medication was reviewed in detail including emphasizing most importantly the difference between maintenance and prns and under what circumstances the prns are to be triggered using an action plan format where appropriate.  Total time for H and P, chart review, counseling, reviewing neb/02  device(s) and generating customized AVS unique to this office visit / same day charting = 45 min complex new pt eval         AVS  Patient Instructions   Recommendations: D/c BREO Increase duoneb to qid and add budesonide  0.25 mg to first dose in am and pm dose (twice daily ) Omeprazole  40 mg Take 30-60 min before first meal of the day and pepcid  20 mg q pm No mint or menthol products  - use hard rock candy like jolly ranchers or lifesavers but not the white ones. Make sure   oxygen saturation  AT   highest level of activity (not after you stop)   to be sure it stays over 90% and adjust  02 flow upward to maintain this level if needed but remember to turn it back to previous settings when you stop (to conserve your supply).  Depomedrol 120 mg IM  Triple flu vaccine                     Stop all smoking if possible   F/u in 4 weeks - call sooner if needed   Ozell America, MD 08/07/2024   .

## 2024-08-07 NOTE — Assessment & Plan Note (Addendum)
 Counseled re importance of smoking cessation but did not meet time criteria for separate billing    Low-dose CT lung cancer screening is recommended for patients who are 39-67 years of age with a 20+ pack-year history of smoking and who are currently smoking or quit <=15 years ago. No coughing up blood  No unintentional weight loss of > 15 pounds in the last 6 months - pt is eligible for scanning yearly until age 23   >>>CT 02/17/2024 :no nodules, pos emphysema so LDSCT  due q June           Each maintenance medication was reviewed in detail including emphasizing most importantly the difference between maintenance and prns and under what circumstances the prns are to be triggered using an action plan format where appropriate.  Total time for H and P, chart review, counseling, reviewing neb/02  device(s) and generating customized AVS unique to this office visit / same day charting = 45 min complex new pt eval

## 2024-09-03 ENCOUNTER — Encounter: Payer: Self-pay | Admitting: Internal Medicine

## 2024-09-03 ENCOUNTER — Ambulatory Visit: Admitting: Internal Medicine

## 2024-09-03 VITALS — BP 124/70 | HR 70

## 2024-09-03 DIAGNOSIS — F17210 Nicotine dependence, cigarettes, uncomplicated: Secondary | ICD-10-CM | POA: Diagnosis not present

## 2024-09-03 DIAGNOSIS — J9611 Chronic respiratory failure with hypoxia: Secondary | ICD-10-CM | POA: Diagnosis not present

## 2024-09-03 DIAGNOSIS — R0609 Other forms of dyspnea: Secondary | ICD-10-CM

## 2024-09-03 NOTE — Patient Instructions (Signed)
 No change in my recommendations  The key is to stop smoking completely before smoking completely stops you!   Make sure you check your oxygen saturation  AT  your highest level of activity (not after you stop)   to be sure it stays over 90% and adjust  02 flow upward to maintain this level if needed but remember to turn it back to previous settings when you stop (to conserve your supply).   Please schedule a follow up visit in 6 months but call sooner if needed  - PFTS in meantime before and after spirometry only

## 2024-09-03 NOTE — Progress Notes (Unsigned)
 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  Pt states dx with COPD approx 5 years ago. He c/o cough- non prod and wheezing.   Dyspnea:  up and down s 02  Cough: no production  Sleep: feels strangled at night 10-20 degrees  SABA use: duoneb once a day  02 use:3 lpm LDSCT 02/2024 :no nodules, pos emphysema   Patient Instructions   Recommendations: D/c BREO  (says wasn't on it in the first place) Increase duoneb to qid and add budesonide  0.25 mg to first dose in am and pm dose (twice daily ) Omeprazole  40 mg Take 30-60 min before first meal of the day and pepcid  20 mg q pm No mint or menthol products  - use hard rock candy like jolly ranchers or lifesavers but not the white ones. Make sure   oxygen saturation  AT   highest level of activity (not after you stop)   to be sure it stays over 90% and adjust  02 flow upward to maintain this level if needed but remember to turn it back to previous settings when you stop (to conserve your supply).  Depomedrol 120 mg IM                Stop all smoking if possible   09/03/2024  f/u ov/[name] re: AB    maint on duoneb/ bud bid  and still smoking  Chief Complaint  Patient presents with   Medical Management of Chronic Issues   Shortness of Breath    Breathing is unchanged since the last visit. No new co's. Continues to smoke 6 cigs per day approx.   Dyspnea:  main exertion is transferring bed to w/c due to no prostheses   Cough: none  Sleeping: maybe 20 degrees no  resp  cc  SABA use: not sure   02: 3lpm most of the time       No obvious day to day or daytime variability or assoc excess/ purulent sputum or mucus plugs or hemoptysis or cp or chest tightness, subjective wheeze or overt sinus or hb symptoms.    Also denies any obvious fluctuation of symptoms with weather or environmental changes or other aggravating or alleviating factors except as outlined above   No unusual exposure hx or h/o childhood pna/ asthma or knowledge of premature birth.  Current Allergies, Complete Past Medical History, Past Surgical History, Family History, and Social History were reviewed in [employer] record.  ROS  The following are not active complaints unless bolded Hoarseness, sore throat, dysphagia, dental problems, itching, sneezing,  nasal congestion or discharge of excess mucus or purulent secretions, ear ache,   fever, chills, sweats, unintended wt loss or wt gain, classically pleuritic or exertional cp,  orthopnea pnd or arm/hand swelling  or leg swelling, presyncope, palpitations, abdominal pain, anorexia, nausea, vomiting, diarrhea  or change in bowel habits or change in bladder habits, change  in stools or change in urine, dysuria, hematuria,  rash, arthralgias, visual complaints, headache, numbness, weakness or ataxia or problems with walking or coordination,  change in mood or  memory.          Outpatient Medications Prior to Visit  Medication Sig Dispense Refill   acetaminophen  (TYLENOL ) 500 MG tablet Take 1,000 mg by mouth 2 (two) times daily.     amLODipine  (NORVASC ) 10 MG tablet Take 10 mg by mouth daily.     benzonatate (TESSALON) 200 MG capsule Take 200 mg by mouth 3 (three) times daily as needed for cough.     busPIRone  (BUSPAR ) 5 MG tablet Take 5 mg by mouth at bedtime.     cetirizine (ZYRTEC) 10 MG tablet Take 10 mg by mouth daily.     cholecalciferol (VITAMIN D3) 25 MCG (1000 UNIT) tablet Take 2,000 Units by mouth daily.      docusate sodium  (COLACE) 100 MG capsule Take 100 mg by mouth daily.     fluticasone  (FLONASE) 50 MCG/ACT nasal spray Place 1 spray into both nostrils daily.     guaiFENesin -dextromethorphan  (ROBITUSSIN DM) 100-10 MG/5ML syrup Take 10 mLs by mouth every 6 (six) hours as needed for cough.     ipratropium-albuterol  (DUONEB) 0.5-2.5 (3) MG/3ML SOLN Take 3 mLs by nebulization See admin instructions. Inhale 1 vial via nebulizer twice daily (scheduled). In addition, inhale 1 vial every 6 hours as needed for shortness of breath or wheezing.     melatonin 3 MG TABS tablet Take 3 mg by mouth at bedtime.     metoprolol  tartrate (LOPRESSOR ) 50 MG tablet Take 1 tablet (50 mg total) by mouth 2 (two) times daily.     OXYGEN Inhale 3 L/min into the lungs continuous.     pantoprazole  (PROTONIX ) 40 MG tablet Take 1 tablet (40 mg total) by mouth daily. 30 tablet 1   polyethylene glycol (MIRALAX  / GLYCOLAX ) 17 g packet Take 17 g by mouth daily as needed for moderate constipation.     thiamine  (VITAMIN B1) 100 MG tablet Take 1 tablet (100 mg total) by mouth daily. 30 tablet 0   No facility-administered medications prior to visit.       Past Medical History:  Diagnosis Date   Critical lower limb ischemia (HCC)    ETOH abuse    Gout    Hypertension        Objective:     wts  09/03/2024      not able    04/08/24 187 lb (84.8 kg)  02/23/24 190 lb 14.7 oz (86.6 kg)  12/03/22 149 lb 14.6 oz (68 kg)      Vital signs reviewed  09/03/2024  - Note at rest 02 sats  97% on 2lpm cont   General appearance:    w/c bound somber  double amputee     HEENT : Oropharynx  clear   Nasal turbinates nl    NECK :  without  apparent JVD/ palpable Nodes/TM    LUNGS: no acc muscle use,  Min barrel  contour chest wall with bilateral  slightly decreased bs s audible wheeze and  without cough on insp or exp maneuvers and min  Hyperresonant  to  percussion bilaterally    CV:  RRR  no s3 or murmur or increase in P2, and  no edema   ABD:  soft and nontender      SKIN: warm and dry without lesions    NEURO:  alert, approp, nl sensorium  with  no motor or cerebellar deficits apparent.             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 Improved but hard to tell as is so sedentary   Pseudowheezing is improved off dpi and on gerd rx so no change in rx needed for now  >>> f/u 6 m with pfts in meantime but only need spirometry before and after    Chronic respiratory failure with hypoxia (HCC) ? 02 dep s/p R BKA  but does not use 02 when propelling arm chair in hallway as of 08/07/2024   Rec 09/03/2024  = titrate to keep sats > 90%          Each maintenance medication was reviewed in detail including emphasizing most importantly the difference between maintenance and prns and under what circumstances the prns are to be triggered using an action plan format where appropriate.  Total time for H and P, chart review, counseling, reviewing nebs/ 02 device(s) and generating customized AVS unique to this office visit / same day charting = 24 min         Cigarette smoker 4  min discussion re active cigarette smoking in addition to office E&M  Ask about tobacco use:   ongoing  Advise quitting   I took an extended  opportunity with this patient to outline the consequences of continued cigarette use  in airway disorders based on all the data we have from the multiple national lung health studies (perfomed over decades at millions of dollars in cost)  indicating that smoking cessation, not choice of inhalers or pulmonary physicians, is the most important aspect of his  care.   Assess willingness:  Not committed at this point Assist in quit attempt:  Per PCP when  ready Arrange follow up:   Follow up per Primary Care planned         AVS  Patient Instructions  No change in my recommendations  The key is to stop smoking completely before smoking completely stops you!   Make sure you check your oxygen saturation  AT  your highest level of activity (not after you stop)   to be sure it stays over 90% and adjust  02 flow upward to maintain this level if needed but remember to turn it back to previous settings when you stop (to conserve your supply).   Please schedule a follow up visit in 6 months but call sooner if needed  - PFTS in meantime before and after spirometry only    Ozell America, MD 09/04/2024                      "

## 2024-09-04 ENCOUNTER — Telehealth: Payer: Self-pay | Admitting: Internal Medicine

## 2024-09-04 NOTE — Assessment & Plan Note (Addendum)
 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 Improved but hard to tell as is so sedentary   Pseudowheezing is improved off dpi and on gerd rx so no change in rx needed for now  >>> f/u 6 m with pfts in meantime but only need spirometry before and after

## 2024-09-04 NOTE — Assessment & Plan Note (Addendum)
?   02 dep s/p R BKA  but does not use 02 when propelling arm chair in hallway as of 08/07/2024   Rec 09/03/2024  = titrate to keep sats > 90%          Each maintenance medication was reviewed in detail including emphasizing most importantly the difference between maintenance and prns and under what circumstances the prns are to be triggered using an action plan format where appropriate.  Total time for H and P, chart review, counseling, reviewing nebs/ 02 device(s) and generating customized AVS unique to this office visit / same day charting = 24 min

## 2024-09-04 NOTE — Assessment & Plan Note (Addendum)
 4-5 min discussion re active cigarette smoking in addition to office E&M  Ask about tobacco use:   ongoing Advise quitting   I took an extended  opportunity with this patient to outline the consequences of continued cigarette use  in airway disorders based on all the data we have from the multiple national lung health studies (perfomed over decades at millions of dollars in cost)  indicating that smoking cessation, not choice of inhalers or pulmonary physicians, is the most important aspect of his  care.   Assess willingness:  Not committed at this point Assist in quit attempt:  Per PCP when ready Arrange follow up:   Follow up per Primary Care planned

## 2024-09-04 NOTE — Telephone Encounter (Signed)
 Needs pfts limited to  spirometry before and after only

## 2024-09-13 ENCOUNTER — Inpatient Hospital Stay (HOSPITAL_COMMUNITY)

## 2024-09-13 ENCOUNTER — Other Ambulatory Visit: Payer: Self-pay

## 2024-09-13 ENCOUNTER — Inpatient Hospital Stay (HOSPITAL_COMMUNITY)
Admission: EM | Admit: 2024-09-13 | Discharge: 2024-09-20 | DRG: 207 | Disposition: A | Discharge status: Skilled Nursing Facility | Attending: Internal Medicine | Admitting: Internal Medicine

## 2024-09-13 ENCOUNTER — Emergency Department (HOSPITAL_COMMUNITY)

## 2024-09-13 DIAGNOSIS — E871 Hypo-osmolality and hyponatremia: Principal | ICD-10-CM

## 2024-09-13 DIAGNOSIS — R278 Other lack of coordination: Secondary | ICD-10-CM

## 2024-09-13 DIAGNOSIS — I739 Peripheral vascular disease, unspecified: Secondary | ICD-10-CM | POA: Diagnosis present

## 2024-09-13 DIAGNOSIS — J9602 Acute respiratory failure with hypercapnia: Secondary | ICD-10-CM | POA: Diagnosis not present

## 2024-09-13 DIAGNOSIS — E878 Other disorders of electrolyte and fluid balance, not elsewhere classified: Secondary | ICD-10-CM

## 2024-09-13 DIAGNOSIS — F1010 Alcohol abuse, uncomplicated: Secondary | ICD-10-CM

## 2024-09-13 DIAGNOSIS — F17210 Nicotine dependence, cigarettes, uncomplicated: Secondary | ICD-10-CM | POA: Diagnosis present

## 2024-09-13 DIAGNOSIS — R579 Shock, unspecified: Secondary | ICD-10-CM | POA: Diagnosis not present

## 2024-09-13 DIAGNOSIS — I6522 Occlusion and stenosis of left carotid artery: Secondary | ICD-10-CM | POA: Diagnosis present

## 2024-09-13 DIAGNOSIS — G9341 Metabolic encephalopathy: Secondary | ICD-10-CM | POA: Diagnosis not present

## 2024-09-13 DIAGNOSIS — Z89512 Acquired absence of left leg below knee: Secondary | ICD-10-CM | POA: Diagnosis not present

## 2024-09-13 DIAGNOSIS — I6389 Other cerebral infarction: Secondary | ICD-10-CM

## 2024-09-13 DIAGNOSIS — J9622 Acute and chronic respiratory failure with hypercapnia: Secondary | ICD-10-CM | POA: Diagnosis present

## 2024-09-13 DIAGNOSIS — J449 Chronic obstructive pulmonary disease, unspecified: Secondary | ICD-10-CM | POA: Diagnosis present

## 2024-09-13 DIAGNOSIS — J9621 Acute and chronic respiratory failure with hypoxia: Principal | ICD-10-CM | POA: Diagnosis present

## 2024-09-13 DIAGNOSIS — D72829 Elevated white blood cell count, unspecified: Secondary | ICD-10-CM | POA: Diagnosis present

## 2024-09-13 DIAGNOSIS — Z781 Physical restraint status: Secondary | ICD-10-CM | POA: Diagnosis not present

## 2024-09-13 DIAGNOSIS — R0689 Other abnormalities of breathing: Secondary | ICD-10-CM | POA: Diagnosis not present

## 2024-09-13 DIAGNOSIS — I959 Hypotension, unspecified: Secondary | ICD-10-CM

## 2024-09-13 DIAGNOSIS — I10 Essential (primary) hypertension: Secondary | ICD-10-CM | POA: Diagnosis present

## 2024-09-13 DIAGNOSIS — E7220 Disorder of urea cycle metabolism, unspecified: Secondary | ICD-10-CM | POA: Diagnosis present

## 2024-09-13 DIAGNOSIS — Z720 Tobacco use: Secondary | ICD-10-CM | POA: Diagnosis not present

## 2024-09-13 DIAGNOSIS — D649 Anemia, unspecified: Secondary | ICD-10-CM | POA: Diagnosis present

## 2024-09-13 DIAGNOSIS — R4182 Altered mental status, unspecified: Secondary | ICD-10-CM | POA: Diagnosis not present

## 2024-09-13 DIAGNOSIS — Z8673 Personal history of transient ischemic attack (TIA), and cerebral infarction without residual deficits: Secondary | ICD-10-CM | POA: Diagnosis not present

## 2024-09-13 DIAGNOSIS — R32 Unspecified urinary incontinence: Secondary | ICD-10-CM | POA: Diagnosis not present

## 2024-09-13 DIAGNOSIS — F10139 Alcohol abuse with withdrawal, unspecified: Secondary | ICD-10-CM | POA: Diagnosis present

## 2024-09-13 DIAGNOSIS — K219 Gastro-esophageal reflux disease without esophagitis: Secondary | ICD-10-CM | POA: Diagnosis present

## 2024-09-13 DIAGNOSIS — J9601 Acute respiratory failure with hypoxia: Secondary | ICD-10-CM | POA: Diagnosis not present

## 2024-09-13 DIAGNOSIS — G928 Other toxic encephalopathy: Secondary | ICD-10-CM | POA: Diagnosis present

## 2024-09-13 DIAGNOSIS — R569 Unspecified convulsions: Secondary | ICD-10-CM | POA: Diagnosis present

## 2024-09-13 DIAGNOSIS — N179 Acute kidney failure, unspecified: Secondary | ICD-10-CM | POA: Diagnosis not present

## 2024-09-13 DIAGNOSIS — Z79899 Other long term (current) drug therapy: Secondary | ICD-10-CM

## 2024-09-13 DIAGNOSIS — Z833 Family history of diabetes mellitus: Secondary | ICD-10-CM

## 2024-09-13 DIAGNOSIS — I6502 Occlusion and stenosis of left vertebral artery: Secondary | ICD-10-CM | POA: Diagnosis present

## 2024-09-13 DIAGNOSIS — Z89511 Acquired absence of right leg below knee: Secondary | ICD-10-CM

## 2024-09-13 DIAGNOSIS — J019 Acute sinusitis, unspecified: Secondary | ICD-10-CM | POA: Diagnosis not present

## 2024-09-13 DIAGNOSIS — R29706 NIHSS score 6: Secondary | ICD-10-CM

## 2024-09-13 DIAGNOSIS — Z8709 Personal history of other diseases of the respiratory system: Secondary | ICD-10-CM | POA: Diagnosis not present

## 2024-09-13 DIAGNOSIS — E8809 Other disorders of plasma-protein metabolism, not elsewhere classified: Secondary | ICD-10-CM | POA: Diagnosis not present

## 2024-09-13 LAB — PROTIME-INR
INR: 1 (ref 0.8–1.2)
Prothrombin Time: 13.7 s (ref 11.4–15.2)

## 2024-09-13 LAB — DIFFERENTIAL
Abs Immature Granulocytes: 0.14 K/uL — ABNORMAL HIGH (ref 0.00–0.07)
Basophils Absolute: 0 K/uL (ref 0.0–0.1)
Basophils Relative: 0 %
Eosinophils Absolute: 0.1 K/uL (ref 0.0–0.5)
Eosinophils Relative: 1 %
Immature Granulocytes: 1 %
Lymphocytes Relative: 8 %
Lymphs Abs: 1 K/uL (ref 0.7–4.0)
Monocytes Absolute: 0.4 K/uL (ref 0.1–1.0)
Monocytes Relative: 4 %
Neutro Abs: 10.5 K/uL — ABNORMAL HIGH (ref 1.7–7.7)
Neutrophils Relative %: 86 %

## 2024-09-13 LAB — BASIC METABOLIC PANEL WITH GFR
Anion gap: 10 (ref 5–15)
Anion gap: 10 (ref 5–15)
Anion gap: 17 — ABNORMAL HIGH (ref 5–15)
Anion gap: 12 (ref 5–15)
BUN: <5 mg/dL — ABNORMAL LOW (ref 8–23)
BUN: <5 mg/dL — ABNORMAL LOW (ref 8–23)
BUN: <5 mg/dL — ABNORMAL LOW (ref 8–23)
BUN: 5 mg/dL — ABNORMAL LOW (ref 8–23)
CO2: 31 mmol/L (ref 22–32)
CO2: 30 mmol/L (ref 22–32)
CO2: 25 mmol/L (ref 22–32)
CO2: 29 mmol/L (ref 22–32)
Calcium: 8.3 mg/dL — ABNORMAL LOW (ref 8.9–10.3)
Calcium: 8.2 mg/dL — ABNORMAL LOW (ref 8.9–10.3)
Calcium: 8.6 mg/dL — ABNORMAL LOW (ref 8.9–10.3)
Calcium: 8.5 mg/dL — ABNORMAL LOW (ref 8.9–10.3)
Chloride: 75 mmol/L — ABNORMAL LOW (ref 98–111)
Chloride: 79 mmol/L — ABNORMAL LOW (ref 98–111)
Chloride: 77 mmol/L — ABNORMAL LOW (ref 98–111)
Chloride: 79 mmol/L — ABNORMAL LOW (ref 98–111)
Creatinine, Ser: 0.41 mg/dL — ABNORMAL LOW (ref 0.61–1.24)
Creatinine, Ser: 0.45 mg/dL — ABNORMAL LOW (ref 0.61–1.24)
Creatinine, Ser: 0.75 mg/dL (ref 0.61–1.24)
Creatinine, Ser: 1.04 mg/dL (ref 0.61–1.24)
GFR, Estimated: >60 mL/min
GFR, Estimated: >60 mL/min
GFR, Estimated: >60 mL/min
GFR, Estimated: >60 mL/min
Glucose, Bld: 169 mg/dL — ABNORMAL HIGH (ref 70–99)
Glucose, Bld: 85 mg/dL (ref 70–99)
Glucose, Bld: 59 mg/dL — ABNORMAL LOW (ref 70–99)
Glucose, Bld: 96 mg/dL (ref 70–99)
Potassium: 3.6 mmol/L (ref 3.5–5.1)
Potassium: 3.5 mmol/L (ref 3.5–5.1)
Potassium: 3.8 mmol/L (ref 3.5–5.1)
Potassium: 3.3 mmol/L — ABNORMAL LOW (ref 3.5–5.1)
Sodium: 116 mmol/L — CL (ref 135–145)
Sodium: 119 mmol/L — CL (ref 135–145)
Sodium: 119 mmol/L — CL (ref 135–145)
Sodium: 120 mmol/L — ABNORMAL LOW (ref 135–145)

## 2024-09-13 LAB — I-STAT ARTERIAL BLOOD GAS, ED
Acid-Base Excess: 9 mmol/L — ABNORMAL HIGH (ref 0.0–2.0)
Acid-Base Excess: 13 mmol/L — ABNORMAL HIGH (ref 0.0–2.0)
Acid-Base Excess: 12 mmol/L — ABNORMAL HIGH (ref 0.0–2.0)
Bicarbonate: 40.4 mmol/L — ABNORMAL HIGH (ref 20.0–28.0)
Bicarbonate: 34.3 mmol/L — ABNORMAL HIGH (ref 20.0–28.0)
Bicarbonate: 34.9 mmol/L — ABNORMAL HIGH (ref 20.0–28.0)
Calcium, Ion: 1.13 mmol/L — ABNORMAL LOW (ref 1.15–1.40)
Calcium, Ion: 0.84 mmol/L — CL (ref 1.15–1.40)
Calcium, Ion: 0.99 mmol/L — ABNORMAL LOW (ref 1.15–1.40)
HCT: 42 % (ref 39.0–52.0)
HCT: 34 % — ABNORMAL LOW (ref 39.0–52.0)
HCT: 34 % — ABNORMAL LOW (ref 39.0–52.0)
Hemoglobin: 14.3 g/dL (ref 13.0–17.0)
Hemoglobin: 11.6 g/dL — ABNORMAL LOW (ref 13.0–17.0)
Hemoglobin: 11.6 g/dL — ABNORMAL LOW (ref 13.0–17.0)
O2 Saturation: 81 %
O2 Saturation: 100 %
O2 Saturation: 100 %
Patient temperature: 97.7
Patient temperature: 100.1
Potassium: 3.8 mmol/L (ref 3.5–5.1)
Potassium: 4.1 mmol/L (ref 3.5–5.1)
Potassium: 3.2 mmol/L — ABNORMAL LOW (ref 3.5–5.1)
Sodium: 111 mmol/L — CL (ref 135–145)
Sodium: 107 mmol/L — CL (ref 135–145)
Sodium: 117 mmol/L — CL (ref 135–145)
TCO2: 43 mmol/L — ABNORMAL HIGH (ref 22–32)
TCO2: 35 mmol/L — ABNORMAL HIGH (ref 22–32)
TCO2: 36 mmol/L — ABNORMAL HIGH (ref 22–32)
pCO2 arterial: 89.3 mmHg (ref 32–48)
pCO2 arterial: 30.8 mmHg — ABNORMAL LOW (ref 32–48)
pCO2 arterial: 41.2 mmHg (ref 32–48)
pH, Arterial: 7.26 — ABNORMAL LOW (ref 7.35–7.45)
pH, Arterial: 7.655 (ref 7.35–7.45)
pH, Arterial: 7.539 — ABNORMAL HIGH (ref 7.35–7.45)
pO2, Arterial: 54 mmHg — ABNORMAL LOW (ref 83–108)
pO2, Arterial: 213 mmHg — ABNORMAL HIGH (ref 83–108)
pO2, Arterial: 292 mmHg — ABNORMAL HIGH (ref 83–108)

## 2024-09-13 LAB — COMPREHENSIVE METABOLIC PANEL WITH GFR
ALT: 6 U/L (ref 0–44)
AST: 18 U/L (ref 15–41)
Albumin: 4.8 g/dL (ref 3.5–5.0)
Alkaline Phosphatase: 84 U/L (ref 38–126)
Anion gap: 7 (ref 5–15)
BUN: <5 mg/dL — ABNORMAL LOW (ref 8–23)
CO2: 36 mmol/L — ABNORMAL HIGH (ref 22–32)
Calcium: 8.9 mg/dL (ref 8.9–10.3)
Chloride: 70 mmol/L — ABNORMAL LOW (ref 98–111)
Creatinine, Ser: 0.32 mg/dL — ABNORMAL LOW (ref 0.61–1.24)
GFR, Estimated: >60 mL/min
Glucose, Bld: 155 mg/dL — ABNORMAL HIGH (ref 70–99)
Potassium: 3.9 mmol/L (ref 3.5–5.1)
Sodium: 113 mmol/L — CL (ref 135–145)
Total Bilirubin: 0.7 mg/dL (ref 0.0–1.2)
Total Protein: 8 g/dL (ref 6.5–8.1)

## 2024-09-13 LAB — URINALYSIS, W/ REFLEX TO CULTURE (INFECTION SUSPECTED)
Bilirubin Urine: NEGATIVE
Glucose, UA: 50 mg/dL — AB
Ketones, ur: NEGATIVE mg/dL
Leukocytes,Ua: NEGATIVE
Nitrite: NEGATIVE
Protein, ur: NEGATIVE mg/dL
Specific Gravity, Urine: 1.023 (ref 1.005–1.030)
pH: 8 (ref 5.0–8.0)

## 2024-09-13 LAB — I-STAT CHEM 8, ED
BUN: <3 mg/dL — ABNORMAL LOW (ref 8–23)
Calcium, Ion: 0.96 mmol/L — ABNORMAL LOW (ref 1.15–1.40)
Chloride: 67 mmol/L — ABNORMAL LOW (ref 98–111)
Creatinine, Ser: 0.5 mg/dL — ABNORMAL LOW (ref 0.61–1.24)
Glucose, Bld: 152 mg/dL — ABNORMAL HIGH (ref 70–99)
HCT: 42 % (ref 39.0–52.0)
Hemoglobin: 14.3 g/dL (ref 13.0–17.0)
Potassium: 6.6 mmol/L (ref 3.5–5.1)
Sodium: 109 mmol/L — CL (ref 135–145)
TCO2: 38 mmol/L — ABNORMAL HIGH (ref 22–32)

## 2024-09-13 LAB — CBC
HCT: 35.1 % — ABNORMAL LOW (ref 39.0–52.0)
Hemoglobin: 12.7 g/dL — ABNORMAL LOW (ref 13.0–17.0)
MCH: 30.6 pg (ref 26.0–34.0)
MCHC: 36.2 g/dL — ABNORMAL HIGH (ref 30.0–36.0)
MCV: 84.6 fL (ref 80.0–100.0)
Platelets: 315 K/uL (ref 150–400)
RBC: 4.15 MIL/uL — ABNORMAL LOW (ref 4.22–5.81)
RDW: 12.6 % (ref 11.5–15.5)
WBC: 12.2 K/uL — ABNORMAL HIGH (ref 4.0–10.5)
nRBC: 0 % (ref 0.0–0.2)

## 2024-09-13 LAB — I-STAT CG4 LACTIC ACID, ED: Lactic Acid, Venous: 0.8 mmol/L (ref 0.5–1.9)

## 2024-09-13 LAB — VITAMIN B12: Vitamin B-12: 632 pg/mL (ref 180–914)

## 2024-09-13 LAB — CBG MONITORING, ED
Glucose-Capillary: 148 mg/dL — ABNORMAL HIGH (ref 70–99)
Glucose-Capillary: 107 mg/dL — ABNORMAL HIGH (ref 70–99)
Glucose-Capillary: 101 mg/dL — ABNORMAL HIGH (ref 70–99)

## 2024-09-13 LAB — APTT: aPTT: 31 s (ref 24–36)

## 2024-09-13 LAB — HEMOGLOBIN A1C
Hgb A1c MFr Bld: 5.4 % (ref 4.8–5.6)
Mean Plasma Glucose: 108.28 mg/dL

## 2024-09-13 LAB — FOLATE: Folate: 15.9 ng/mL

## 2024-09-13 LAB — ETHANOL: Alcohol, Ethyl (B): <15 mg/dL

## 2024-09-13 LAB — AMMONIA: Ammonia: 125 umol/L — ABNORMAL HIGH (ref 9–35)

## 2024-09-13 LAB — GLUCOSE, CAPILLARY
Glucose-Capillary: 90 mg/dL (ref 70–99)
Glucose-Capillary: 83 mg/dL (ref 70–99)
Glucose-Capillary: 104 mg/dL — ABNORMAL HIGH (ref 70–99)

## 2024-09-13 LAB — TSH: TSH: 0.667 u[IU]/mL (ref 0.350–4.500)

## 2024-09-13 MED ORDER — PHENYLEPHRINE 80 MCG/ML (10ML) SYRINGE FOR IV PUSH (FOR BLOOD PRESSURE SUPPORT)
PREFILLED_SYRINGE | INTRAVENOUS | Status: AC
  Administered 2024-09-13: 80 ug
  Filled 2024-09-13: qty 10

## 2024-09-13 MED ORDER — SODIUM CHLORIDE 3 % IV BOLUS
50.0000 mL | Freq: Once | INTRAVENOUS | Status: AC
  Administered 2024-09-13: 50 mL via INTRAVENOUS
  Filled 2024-09-13: qty 500

## 2024-09-13 MED ORDER — THIAMINE HCL 100 MG/ML IJ SOLN
100.0000 mg | Freq: Every day | INTRAMUSCULAR | Status: DC
  Administered 2024-09-13 – 2024-09-17 (×5): 100 mg via INTRAVENOUS
  Filled 2024-09-13 (×6): qty 2

## 2024-09-13 MED ORDER — REVEFENACIN 175 MCG/3ML IN SOLN: 175.0000 ug | Freq: Every day | RESPIRATORY_TRACT | Status: DC

## 2024-09-13 MED ORDER — FENTANYL CITRATE (PF) 50 MCG/ML IJ SOSY
PREFILLED_SYRINGE | INTRAMUSCULAR | Status: AC
  Filled 2024-09-13: qty 2

## 2024-09-13 MED ORDER — CHLORHEXIDINE GLUCONATE CLOTH 2 % EX PADS
6.0000 | MEDICATED_PAD | Freq: Every day | CUTANEOUS | Status: DC
  Administered 2024-09-14 – 2024-09-20 (×7): 6 via TOPICAL

## 2024-09-13 MED ORDER — KETAMINE HCL 50 MG/5ML IJ SOSY
PREFILLED_SYRINGE | INTRAMUSCULAR | Status: AC
  Filled 2024-09-13: qty 10

## 2024-09-13 MED ORDER — ONDANSETRON HCL 4 MG/2ML IJ SOLN
4.0000 mg | Freq: Four times a day (QID) | INTRAMUSCULAR | Status: DC | PRN
  Administered 2024-09-13: 4 mg via INTRAVENOUS
  Filled 2024-09-13: qty 2

## 2024-09-13 MED ORDER — DESMOPRESSIN ACETATE 4 MCG/ML IJ SOLN
2.0000 ug | Freq: Once | INTRAMUSCULAR | Status: AC
  Administered 2024-09-13: 2 ug via INTRAVENOUS
  Filled 2024-09-13: qty 1

## 2024-09-13 MED ORDER — INSULIN ASPART 100 UNIT/ML IJ SOLN: 0.0000 [IU] | INTRAMUSCULAR | Status: DC

## 2024-09-13 MED ORDER — MIDAZOLAM HCL 2 MG/2ML IJ SOLN
INTRAMUSCULAR | Status: AC
  Filled 2024-09-13: qty 2

## 2024-09-13 MED ORDER — SODIUM CHLORIDE 3% (HYPERTONIC SALINE) BOLUS VIA INFUSION
50.0000 mL | INTRAVENOUS | Status: DC
  Filled 2024-09-13: qty 50

## 2024-09-13 MED ORDER — FAMOTIDINE 20 MG PO TABS: 20.0000 mg | ORAL_TABLET | Freq: Two times a day (BID) | ORAL | Status: DC

## 2024-09-13 MED ORDER — FOLIC ACID 5 MG/ML IJ SOLN
1.0000 mg | Freq: Every day | INTRAMUSCULAR | Status: AC
  Administered 2024-09-13 – 2024-09-17 (×5): 1 mg via INTRAVENOUS
  Filled 2024-09-13 (×7): qty 0.2

## 2024-09-13 MED ORDER — ACETAMINOPHEN 325 MG PO TABS
650.0000 mg | ORAL_TABLET | Freq: Four times a day (QID) | ORAL | Status: DC | PRN
  Administered 2024-09-13 – 2024-09-14 (×3): 650 mg
  Filled 2024-09-13 (×3): qty 2

## 2024-09-13 MED ORDER — SUCCINYLCHOLINE CHLORIDE 200 MG/10ML IV SOSY: 100.0000 mg | PREFILLED_SYRINGE | Freq: Once | INTRAVENOUS | Status: AC

## 2024-09-13 MED ORDER — IOHEXOL 350 MG/ML SOLN
75.0000 mL | Freq: Once | INTRAVENOUS | Status: AC | PRN
  Administered 2024-09-13: 75 mL via INTRAVENOUS

## 2024-09-13 MED ORDER — ETOMIDATE 2 MG/ML IV SOLN: 20.0000 mg | Freq: Once | INTRAVENOUS | Status: AC

## 2024-09-13 MED ORDER — PANTOPRAZOLE SODIUM 40 MG IV SOLR
40.0000 mg | Freq: Every day | INTRAVENOUS | Status: DC
  Administered 2024-09-13 – 2024-09-17 (×5): 40 mg via INTRAVENOUS
  Filled 2024-09-13 (×5): qty 10

## 2024-09-13 MED ORDER — REVEFENACIN 175 MCG/3ML IN SOLN
175.0000 ug | Freq: Every day | RESPIRATORY_TRACT | Status: DC
  Administered 2024-09-14 – 2024-09-20 (×7): 175 ug via RESPIRATORY_TRACT
  Filled 2024-09-13 (×7): qty 3

## 2024-09-13 MED ORDER — ETOMIDATE 2 MG/ML IV SOLN
INTRAVENOUS | Status: AC
  Administered 2024-09-13: 20 mg via INTRAVENOUS
  Filled 2024-09-13: qty 20

## 2024-09-13 MED ORDER — ARFORMOTEROL TARTRATE 15 MCG/2ML IN NEBU
15.0000 ug | INHALATION_SOLUTION | Freq: Two times a day (BID) | RESPIRATORY_TRACT | Status: DC
  Administered 2024-09-14 – 2024-09-20 (×13): 15 ug via RESPIRATORY_TRACT
  Filled 2024-09-13 (×14): qty 2

## 2024-09-13 MED ORDER — SODIUM CHLORIDE 3 % IV SOLN
INTRAVENOUS | Status: DC
  Filled 2024-09-13: qty 500

## 2024-09-13 MED ORDER — NOREPINEPHRINE 4 MG/250ML-% IV SOLN
INTRAVENOUS | Status: AC
  Filled 2024-09-13: qty 250

## 2024-09-13 MED ORDER — NOREPINEPHRINE 4 MG/250ML-% IV SOLN
0.0000 ug/min | INTRAVENOUS | Status: DC
  Administered 2024-09-13: 9 ug/min via INTRAVENOUS
  Administered 2024-09-13: 5 ug/min via INTRAVENOUS
  Administered 2024-09-14: 6 ug/min via INTRAVENOUS
  Filled 2024-09-13 (×3): qty 250

## 2024-09-13 MED ORDER — SODIUM CHLORIDE 3% (HYPERTONIC SALINE) BOLUS VIA INFUSION
50.0000 mL | Freq: Once | INTRAVENOUS | Status: DC
  Filled 2024-09-13: qty 50

## 2024-09-13 MED ORDER — HEPARIN SODIUM (PORCINE) 5000 UNIT/ML IJ SOLN
5000.0000 [IU] | Freq: Three times a day (TID) | INTRAMUSCULAR | Status: DC
  Administered 2024-09-13 – 2024-09-20 (×23): 5000 [IU] via SUBCUTANEOUS
  Filled 2024-09-13 (×23): qty 1

## 2024-09-13 MED ORDER — PROPOFOL 1000 MG/100ML IV EMUL
0.0000 ug/kg/min | INTRAVENOUS | Status: DC
  Administered 2024-09-13: 30 ug/kg/min via INTRAVENOUS
  Administered 2024-09-13: 10 ug/kg/min via INTRAVENOUS
  Administered 2024-09-13 – 2024-09-14 (×2): 30 ug/kg/min via INTRAVENOUS
  Administered 2024-09-14: 20 ug/kg/min via INTRAVENOUS
  Administered 2024-09-14: 30 ug/kg/min via INTRAVENOUS
  Administered 2024-09-15 – 2024-09-16 (×2): 20 ug/kg/min via INTRAVENOUS
  Administered 2024-09-16: 60 ug/kg/min via INTRAVENOUS
  Filled 2024-09-13 (×10): qty 100

## 2024-09-13 MED ORDER — SUCCINYLCHOLINE CHLORIDE 200 MG/10ML IV SOSY
PREFILLED_SYRINGE | INTRAVENOUS | Status: AC
  Administered 2024-09-13: 100 mg via INTRAVENOUS
  Filled 2024-09-13: qty 10

## 2024-09-13 MED ORDER — FENTANYL 2500MCG IN NS 250ML (10MCG/ML) PREMIX INFUSION
0.0000 ug/h | INTRAVENOUS | Status: DC
  Administered 2024-09-13: 50 ug/h via INTRAVENOUS
  Administered 2024-09-13 – 2024-09-14 (×2): 200 ug/h via INTRAVENOUS
  Administered 2024-09-15: 100 ug/h via INTRAVENOUS
  Administered 2024-09-16: 50 ug/h via INTRAVENOUS
  Filled 2024-09-13 (×6): qty 250

## 2024-09-13 MED ORDER — ROCURONIUM BROMIDE 10 MG/ML (PF) SYRINGE
PREFILLED_SYRINGE | INTRAVENOUS | Status: AC
  Filled 2024-09-13: qty 10

## 2024-09-13 MED ORDER — BUDESONIDE 0.25 MG/2ML IN SUSP
0.2500 mg | Freq: Two times a day (BID) | RESPIRATORY_TRACT | Status: DC
  Administered 2024-09-13 – 2024-09-20 (×14): 0.25 mg via RESPIRATORY_TRACT
  Filled 2024-09-13 (×15): qty 2

## 2024-09-13 NOTE — Progress Notes (Incomplete)
 eLink Physician-Brief Progress Note Patient Name: Ronald [name] DOB: [birth year] MRN: [id number]   Date of Service  09/13/2024  HPI/Events of Note  Monitor Na, dont exceed 8-12 in 24h   eICU Interventions  Maintain less than 121 by 0358,      Intervention Category Intermediate Interventions: Electrolyte abnormality - evaluation and management  Memori Sammon 09/13/2024, 11:58 PM

## 2024-09-13 NOTE — Progress Notes (Signed)
 EEG complete - results pending

## 2024-09-13 NOTE — Progress Notes (Signed)
 RT assisted with transport of this pt from ED to 82M while on full ventilatory support. Pt tolerated well with SVS.

## 2024-09-13 NOTE — Progress Notes (Signed)
 RT assisted with transport of this pt from 40M to MRI and back while on full ventilatory support. Pt tolerated well.

## 2024-09-13 NOTE — ED Notes (Addendum)
 Date and time results received: 09/13/2024 0558 (use smartphrase .now to insert current time)  Test: sodium Critical Value: 113  Name of Provider Notified: c pollina md  Orders Received? Or Actions Taken?: n/a

## 2024-09-13 NOTE — ED Notes (Signed)
 20mg  etomidate  at 0453  100mg  succ at 0453  7.5 ett in at 0455  + color change at 055  24 at the lip

## 2024-09-13 NOTE — Progress Notes (Addendum)
 "  NAME:  Ronald  [name], MRN:  [id number], DOB:  13-Aug-1956, LOS: 0 ADMISSION DATE:  09/13/2024, CONSULTATION DATE:  09/13/2024 REFERRING MD:  Lonni Seats, MD, CHIEF COMPLAINT:  AMS  History of Present Illness:  69 y/o male with h/o Etoh use/abuse, SDH, COPD, Tobacco abuse disoerder, Gout, HTN, b/l BKAs, PAD who initially presented from long term care facility with confusion and was a code stroke.  LKW 2230 12/31 when he went out to smoke, later having tremors and rhythmic jerking while being drowsy.  He was seen by Neurology and felt to have metabolic derangements explaining his mental status.  TNK not given.  He was found to have a Sodium 109, K 6.6, WBC 12.2.  Because he was not able to protect his airways, he was intubated in the ED after which his BP dropped precipitously and he was started on Levophed .  Pertinent  Medical History  Etoh use/abuse, SDH, COPD, Tobacco abuse disoerder, Gout, HTN, b/l BKAs, PAD  Significant Hospital Events: Including procedures, antibiotic start and stop dates in addition to other pertinent events   1/1 presented initially with altered mental status, hyponatremia, and eventual hypotension after intubation  Interim History / Subjective:  Sedated on vent  Objective    Blood pressure (!) 167/76, pulse 67, temperature 99.4 F (37.4 C), resp. rate 16, height 5' 7 (1.702 m), weight 88 kg, SpO2 100%.    Vent Mode: PRVC FiO2 (%):  [100 %] 100 % Set Rate:  [24 bmp] 24 bmp Vt Set:  [530 mL] 530 mL PEEP:  [5 cmH20] 5 cmH20 Plateau Pressure:  [26 cmH20] 26 cmH20   Intake/Output Summary (Last 24 hours) at 09/13/2024 0847 Last data filed at 09/13/2024 9165 Gross per 24 hour  Intake 49.1 ml  Output --  Net 49.1 ml   Filed Weights   09/13/24 0300  Weight: 88 kg    Examination: General: Acute on chronic ill-appearing middle-aged male lying in bed on mechanical ventilation in no acute distress HEENT: ETT, MM pink/moist, PERRL,  Neuro: Sedated on  vent CV: s1s2 regular rate and rhythm, no murmur, rubs, or gallops,  PULM:  Clear to auscultation, no increased work of breathing, tolerating ventilator GI: soft, bowel sounds active in all 4 quadrants, non-tender, non-distended Extremities: warm/dry, no edema  Skin: no rashes or lesions   Resolved problem list   Assessment and Plan  Acute hypoxic respiratory failure -Shortly after arrival to ED given AMS patient was seen unable to protect airway resulting in intubation History of COPD Tobacco use P: Continue ventilator support with lung protective strategies  Wean PEEP and FiO2 for sats greater than 90%. Head of bed elevated 30 degrees. Plateau pressures less than 30 cm H20.  Follow intermittent chest x-ray and ABG.   SAT/SBT as tolerated, mentation preclude extubation  Ensure adequate pulmonary hygiene  Follow cultures  VAP bundle in place  PAD protocol Scheduled bronchodilators  AMS - Workup thus far indicates metabolic derangements including significant hyponatremia likely resulting in his AMS -On a.m. evaluation patient was seen with a rhythmic jaw twitching and right upper gaze this was in the setting of perfusing propofol  infusion -CTA head and neck with severe stenosis of the left vertebral artery due to chronic atherosclerosis and moderate stenosis of the supraclinoid left ICA P: Neurology evaluated on arrival, appreciate assistance Maintain neuro protective measures; goal for eurothermia, euglycemia, eunatermia, normoxia, and PCO2 goal of 35-40 Nutrition and bowel regiment Unable to rule out underlying seizure at this time, routine  spot EEG As needed benzodiazepines Seizure precautions  Aspirations precautions  Hypertonic saline as below Optimize electrolytes as below Follow-up MRI brain  Hyponatremia -Na 109 on presentation with associated altered mental status -Goal to raise Na levels 4 to 6 ,Eq/L in the first 24-hrs  -Urine sodium less than 10, urine  osmolality 143 P: Continue hypertonic saline Monitor sodium levels closely Trend bmet  Avoid over correction  Check Urine Na and osms   Hypotension - Felt secondary to sedation P: Trend CBC and fever curve Low threshold to initiate empiric antibiotics Wean sedation as able If vasopressors remain elevated will need central access  ETOH use/abuse P: CIWA protocol Seizure precautions Supplement thiamine , folate, multivitamin  PAD s/p b/l BKAs P: Supportive care  Critical care time:   CRITICAL CARE Performed by: Jazzmine Kleiman D. [name]   Total critical care time: 30 minutes additional critical care time  Critical care time was exclusive of separately billable procedures and treating other patients.  Critical care was necessary to treat or prevent imminent or life-threatening deterioration.  Critical care was time spent personally by me on the following activities: development of treatment plan with patient and/or surrogate as well as nursing, discussions with consultants, evaluation of patient's response to treatment, examination of patient, obtaining history from patient or surrogate, ordering and performing treatments and interventions, ordering and review of laboratory studies, ordering and review of radiographic studies, pulse oximetry and re-evaluation of patient's condition.  Ronald Linford D. Harris, NP-C [facility] Personal contact information can be found on Amion  If no contact or response made please call [phone] 09/13/2024, 9:04 AM           "

## 2024-09-13 NOTE — Code Documentation (Signed)
 Responded to Code Stroke called at 0255 for AMS and aphasia, LSN-2230. Pt arrived at 0303, CBG-148, NIH-6, CT head negative for acute changes, CTA-no LVO. TNK not given-outside window. Plan metabolic w/o. Please complete VS/neuro checks(including NIH) q2h x 12h, then q4h.

## 2024-09-13 NOTE — Progress Notes (Addendum)
 eLink Physician-Brief Progress Note Patient Name: Ronald  [name] DOB: 1955-09-25 MRN: [id number]   Date of Service  09/13/2024  HPI/Events of Note  Monitor Na, dont exceed 8-12 in 24h   eICU Interventions  Maintain less than 121 by 0358, No intervention right now   0528 -latest sodium still pending, renew restraints for patient's safety in the meantime  0658 - sudden drop to Na 115, recheck Na  Intervention Category Intermediate Interventions: Electrolyte abnormality - evaluation and management  Shizuo Biskup 09/13/2024, 11:58 PM

## 2024-09-13 NOTE — Progress Notes (Signed)
 Patient vomiting primary RN at bedside to assist immediately.   Pharm D- provided Ondasetron.

## 2024-09-13 NOTE — ED Notes (Signed)
 NP notified of febrile temperature 100.4

## 2024-09-13 NOTE — Consult Note (Signed)
 NEUROLOGY CONSULT NOTE   Date of service: September 13, 2024 Patient Name: Ronald [name] MRN:  [id number] DOB:  January 09, 1956 Chief Complaint: confusion, tremors Requesting Provider: Haze Lonni PARAS, *  History of Present Illness  Ronald  [name] is a 69 y.o. male with hx of EtOh use, smoker with COPD, hx of SDH, peripheral vascular disease, previous  hypertension, gout, bilateral BKA who is a long-term nursing home resident and brought in for acute onset confusion.  Was noted be at his baseline at 2230 on 09/12/24 when he went outside to smoke. Noted later to have arhythmic jerks/tremors in BL shoulder and arms and delayed responses and drowsiness. He has also been incontinent of urine over the last few hours.  He was brought in to the ED as a code stroke.  Pt is unable to provide much history. Sisters at the bedside last saw him on Monday and he was fine. They were informed about transfer to the ED but do not know details about what happened tonight.  LKW: 2230 Modified rankin score: 4-Needs assistance to walk and tend to bodily needs IV Thrombolysis: not offered, outside window on arrival and overall, low suspicion for stroke. EVT: not offered, low suspicion for stroke and no LVO  NIHSS components Score: Comment  1a Level of Conscious 0[]  1[x]  2[]  3[]      1b LOC Questions 0[x]  1[]  2[]       1c LOC Commands 0[x]  1[]  2[]       2 Best Gaze 0[x]  1[]  2[]       3 Visual 0[x]  1[]  2[]  3[]      4 Facial Palsy 0[x]  1[]  2[]  3[]      5a Motor Arm - left 0[]  1[x]  2[]  3[]  4[]  UN[]    5b Motor Arm - Right 0[]  1[x]  2[]  3[]  4[]  UN[]    6a Motor Leg - Left 0[]  1[x]  2[]  3[]  4[]  UN[]    6b Motor Leg - Right 0[]  1[x]  2[]  3[]  4[]  UN[]    7 Limb Ataxia 0[x]  1[]  2[]  UN[]      8 Sensory 0[x]  1[]  2[]  UN[]      9 Best Language 0[]  1[x]  2[]  3[]      10 Dysarthria 0[x]  1[]  2[]  UN[]      11 Extinct. and Inattention 0[x]  1[]  2[]       TOTAL: 6      ROS   Unable to ascertain due to encephalopathy/ delayed  responses.  Past History   Past Medical History:  Diagnosis Date   Critical lower limb ischemia (HCC)    ETOH abuse    Gout    Hypertension     Past Surgical History:  Procedure Laterality Date   ABDOMINAL AORTAGRAM N/A 12/26/2013   Procedure: ABDOMINAL EZELLA;  Surgeon: Gaile LELON New, MD;  Location: [facility] CATH LAB;  Service: Cardiovascular;  Laterality: N/A;   AMPUTATION Left 12/28/2013   Procedure: LEFT BELOW KNEE AMPUTATION;  Surgeon: Jerona LULLA Sage, MD;  Location: [facility] OR;  Service: Orthopedics;  Laterality: Left;   AMPUTATION Right 11/17/2022   Procedure: RIGHT BELOW KNEE AMPUTATION;  Surgeon: Sage Jerona LULLA, MD;  Location: [facility] OR;  Service: Orthopedics;  Laterality: Right;   BELOW KNEE LEG AMPUTATION     Left   LEG SURGERY      Family History: Family History  Problem Relation Age of Onset   Diabetes Mellitus II Mother    CAD Neg Hx    Stroke Neg Hx     Social History  reports that he has been smoking cigarettes. He has a  80 pack-year smoking history. He has never used smokeless tobacco. He reports current alcohol  use of about 120.0 standard drinks of alcohol  per week. He reports current drug use. Drugs: Cocaine  and Marijuana.  Allergies[1]  Medications  Current Medications[2]  Vitals   Vitals:   09/13/24 0300 09/13/24 0330 09/13/24 0334 09/13/24 0335  BP:  (!) 165/86 (!) 165/86   Pulse:  72 74 72  Resp:  (!) 23 20 20   SpO2:  94% 92% 95%  Weight: 88 kg     Height: 5' 7 (1.702 m)       Body mass index is 30.39 kg/m.   Physical Exam   General: Laying comfortably in bed; in no acute distress.  HENT: Normal oropharynx and mucosa. Normal external appearance of ears and nose.  Neck: Supple, no pain or tenderness  CV: No JVD. No peripheral edema.  Pulmonary: Symmetric Chest rise. Normal respiratory effort.  Abdomen: Soft to touch, non-tender.  Ext: No cyanosis, edema, or deformity  Skin: No rash. Normal palpation of skin.   Musculoskeletal: Normal digits and  nails by inspection. No clubbing.   Neurologic Examination  Mental status/Cognition: drowsy with delayed responses, oriented to self, place, month and year, poor attention Speech/language: bradyphrenia, non fluent, comprehension intact, object naming intact. Cranial nerves:   CN II Pupils equal and reactive to light, no VF deficits    CN III,IV,VI EOM intact, no gaze preference or deviation, no nystagmus   CN V normal sensation in V1, V2, and V3 segments bilaterally    CN VII no asymmetry, no nasolabial fold flattening    CN VIII normal hearing to speech    CN IX & X normal palatal elevation, no uvular deviation    CN XI 5/5 head turn and 5/5 shoulder shrug bilaterally    CN XII midline tongue protrusion    Motor:  Muscle bulk: normal, tone normal, pronator drift none. tremor asterixis/negative myoclonus noted in BL upper extremities with tremors. 5/5 Hand grip BL Holds BL upper extremities off the bed for more than 10 secs but arm drops down BL due to negative myoclonus. Hs BL BKA but he is ablte lift stump off the bed BL with noted asterixis.  Sensation:  Light touch Intact throughout   Pin prick    Temperature    Vibration   Proprioception    Coordination/Complex Motor:  - Finger to Nose intact BL despite asterixis. - Heel to shin unable to assess with BL BKA. - Gait: deferred for pt safety.  Labs/Imaging/Neurodiagnostic studies   CBC: No results for input(s): WBC, NEUTROABS, HGB, HCT, MCV, PLT in the last 168 hours. Basic Metabolic Panel:  Lab Results  Component Value Date   NA 134 (L) 04/08/2024   K 4.2 04/08/2024   CO2 32 04/08/2024   GLUCOSE 126 (H) 04/08/2024   BUN 5 (L) 04/08/2024   CREATININE 0.55 (L) 04/08/2024   CALCIUM  9.1 04/08/2024   GFRNONAA >60 04/08/2024   GFRAA >60 03/24/2019   Lipid Panel: No results found for: LDLCALC HgbA1c:  Lab Results  Component Value Date   HGBA1C 5.4 02/17/2024   Urine Drug Screen:     Component Value  Date/Time   LABOPIA NONE DETECTED 04/05/2024 0211   COCAINSCRNUR NONE DETECTED 04/05/2024 0211   COCAINSCRNUR See Final Results 09/04/2022 1342   LABBENZ POSITIVE (A) 04/05/2024 0211   AMPHETMU NONE DETECTED 04/05/2024 0211   THCU NONE DETECTED 04/05/2024 0211   LABBARB NONE DETECTED 04/05/2024 0211    Alcohol   Level     Component Value Date/Time   [facility] <15 04/04/2024 1845   INR  Lab Results  Component Value Date   INR 1.1 03/17/2019   APTT  Lab Results  Component Value Date   APTT 31 03/17/2019   AED levels: No results found for: PHENYTOIN , ZONISAMIDE, LAMOTRIGINE, LEVETIRACETA  CT Head without contrast(Personally reviewed): CTH was negative for a large hypodensity concerning for a large territory infarct or hyperdensity concerning for an ICH  CT angio Head and Neck with contrast(Personally reviewed): No LVO  ASSESSMENT   Allin  Gladu is a 69 y.o. male ith hx of EtOh use, smoker with COPD, hx of SDH, peripheral vascular disease, previous  hypertension, gout, bilateral BKA who is a long-term nursing home resident and brought in for acute onset confusion. Was noted be at his baseline at 2230 on 09/12/24 when he went outside to smoke. Noted later to have arhythmic jerks/tremors in BL shoulder and arms and delayed responses and drowsiness. He has also been incontinent of urine over the last few hours.  Neuro exam notable for encephalopathy, drowsiness along with asterixis/negative myoclonus.  Differential is broad and includes electrolyte imbalance, hypercarbia, hyperammonemia, medication induced, ?potential underlying infection or less likely but a potential basal ganglia stroke.  RECOMMENDATIONS  - CBC, chemistry - Ammonia, blood gas, TSH, UDS. - get MRI Brain with and without contrast if above is nonrevealing. ______________________________________________________________________  Plan discussed with Dr. Haze with the ED team.  Signed, Gwenn Teodoro,  MD [facility]     [1] No Known Allergies [2] No current facility-administered medications for this encounter.  Current Outpatient Medications:    acetaminophen  (TYLENOL ) 500 MG tablet, Take 1,000 mg by mouth 2 (two) times daily., Disp: , Rfl:    amLODipine  (NORVASC ) 10 MG tablet, Take 10 mg by mouth daily., Disp: , Rfl:    benzonatate (TESSALON) 200 MG capsule, Take 200 mg by mouth 3 (three) times daily as needed for cough., Disp: , Rfl:    busPIRone  (BUSPAR ) 5 MG tablet, Take 5 mg by mouth at bedtime., Disp: , Rfl:    cetirizine (ZYRTEC) 10 MG tablet, Take 10 mg by mouth daily., Disp: , Rfl:    cholecalciferol (VITAMIN D3) 25 MCG (1000 UNIT) tablet, Take 2,000 Units by mouth daily., Disp: , Rfl:    docusate sodium  (COLACE) 100 MG capsule, Take 100 mg by mouth daily., Disp: , Rfl:    fluticasone  (FLONASE) 50 MCG/ACT nasal spray, Place 1 spray into both nostrils daily., Disp: , Rfl:    guaiFENesin -dextromethorphan  (ROBITUSSIN DM) 100-10 MG/5ML syrup, Take 10 mLs by mouth every 6 (six) hours as needed for cough., Disp: , Rfl:    ipratropium-albuterol  (DUONEB) 0.5-2.5 (3) MG/3ML SOLN, Take 3 mLs by nebulization See admin instructions. Inhale 1 vial via nebulizer twice daily (scheduled). In addition, inhale 1 vial every 6 hours as needed for shortness of breath or wheezing., Disp: , Rfl:    melatonin 3 MG TABS tablet, Take 3 mg by mouth at bedtime., Disp: , Rfl:    metoprolol  tartrate (LOPRESSOR ) 50 MG tablet, Take 1 tablet (50 mg total) by mouth 2 (two) times daily., Disp: , Rfl:    OXYGEN, Inhale 3 L/min into the lungs continuous., Disp: , Rfl:    pantoprazole  (PROTONIX ) 40 MG tablet, Take 1 tablet (40 mg total) by mouth daily., Disp: 30 tablet, Rfl: 1   polyethylene glycol (MIRALAX  / GLYCOLAX ) 17 g packet, Take 17 g by mouth daily as needed for moderate constipation.,  Disp: , Rfl:    thiamine  (VITAMIN B1) 100 MG tablet, Take 1 tablet (100 mg total) by mouth daily., Disp: 30 tablet,  Rfl: 0

## 2024-09-13 NOTE — Procedures (Signed)
 Patient Name: Adriel Kessen  MRN: [id number]  Epilepsy Attending: Arlin MALVA Krebs  Referring Physician/Provider: Arloa Folks D, NP  Date: 09/13/2024 Duration: 22.24 mins  Patient history: 69yo M with ams. EEG to evaluate for seizure  Level of alertness: Comatose/ lethargic   AEDs during EEG study: Propofol   Technical aspects: This EEG study was done with scalp electrodes positioned according to the 10-20 International system of electrode placement. Electrical activity was reviewed with band pass filter of 1-70Hz , sensitivity of 7 uV/mm, display speed of 32mm/sec with a 60Hz  notched filter applied as appropriate. EEG data were recorded continuously and digitally stored.  Video monitoring was available and reviewed as appropriate.  Description: EEG showed continuous generalized 3 to 6 Hz theta-delta slowing. Hyperventilation and photic stimulation were not performed.     ABNORMALITY - Continuous slow, generalized  IMPRESSION: This study is suggestive of generalized cerebral dysfunction  (encephalopathy). No seizures or epileptiform discharges were seen throughout the recording.  Sharin Altidor O Inell Mimbs

## 2024-09-13 NOTE — ED Provider Notes (Addendum)
 " [place] EMERGENCY DEPARTMENT AT [facility] Provider Note   CSN: [id number] Arrival date & time: 09/13/24  9696  An emergency department physician performed an initial assessment on this suspected stroke patient at 0303.  Patient presents with: Code Stroke   Ronald [name] is a 69 y.o. male.   Brought to the emergency department from nursing home for code stroke.  Last known normal was 10:30 PM when he went out to smoke a cigarette.  Upon recheck, patient was found to be confused, slurred speech, shaking all over.       Prior to Admission medications  Medication Sig Start Date End Date Taking? Authorizing Provider  acetaminophen  (TYLENOL ) 500 MG tablet Take 1,000 mg by mouth 2 (two) times daily.    [provider]  amLODipine  (NORVASC ) 10 MG tablet Take 10 mg by mouth daily.    [provider]  benzonatate (TESSALON) 200 MG capsule Take 200 mg by mouth 3 (three) times daily as needed for cough.    [provider]  busPIRone  (BUSPAR ) 5 MG tablet Take 5 mg by mouth at bedtime.    [provider]  cetirizine (ZYRTEC) 10 MG tablet Take 10 mg by mouth daily.    [provider]  cholecalciferol (VITAMIN D3) 25 MCG (1000 UNIT) tablet Take 2,000 Units by mouth daily.    [provider]  docusate sodium  (COLACE) 100 MG capsule Take 100 mg by mouth daily.    [provider]  fluticasone  (FLONASE) 50 MCG/ACT nasal spray Place 1 spray into both nostrils daily.    [provider]  guaiFENesin -dextromethorphan  (ROBITUSSIN DM) 100-10 MG/5ML syrup Take 10 mLs by mouth every 6 (six) hours as needed for cough.    [provider]  ipratropium-albuterol  (DUONEB) 0.5-2.5 (3) MG/3ML SOLN Take 3 mLs by nebulization See admin instructions. Inhale 1 vial via nebulizer twice daily (scheduled). In addition, inhale 1 vial every 6 hours as needed for shortness of breath or wheezing.    [provider]  melatonin  3 MG TABS tablet Take 3 mg by mouth at bedtime.    [provider]  metoprolol  tartrate (LOPRESSOR ) 50 MG tablet Take 1 tablet (50 mg total) by mouth 2 (two) times daily. 12/03/22   Odell Celinda Balo, MD  OXYGEN Inhale 3 L/min into the lungs continuous.    [provider]  pantoprazole  (PROTONIX ) 40 MG tablet Take 1 tablet (40 mg total) by mouth daily. 09/29/22   Akula, Vijaya, MD  polyethylene glycol (MIRALAX  / GLYCOLAX ) 17 g packet Take 17 g by mouth daily as needed for moderate constipation. 04/08/24   Dorinda Drue DASEN, MD  thiamine  (VITAMIN B1) 100 MG tablet Take 1 tablet (100 mg total) by mouth daily. 09/17/22   Bryn Bernardino NOVAK, MD    Allergies: Patient has no known allergies.    Review of Systems  Updated Vital Signs BP (!) 178/93   Pulse 74   Temp 97.7 F (36.5 C) (Oral)   Resp (!) 21   Ht 5' 7 (1.702 m)   Wt 88 kg   SpO2 100%   BMI 30.39 kg/m   Physical Exam Vitals and nursing note reviewed.  Constitutional:      General: He is not in acute distress.    Appearance: He is well-developed.  HENT:     Head: Normocephalic and atraumatic.     Mouth/Throat:     Mouth: Mucous membranes are moist.  Eyes:     General:  Vision grossly intact. Gaze aligned appropriately.     Extraocular Movements: Extraocular movements intact.     Conjunctiva/sclera: Conjunctivae normal.  Cardiovascular:     Rate and Rhythm: Normal rate and regular rhythm.     Pulses: Normal pulses.     Heart sounds: Normal heart sounds, S1 normal and S2 normal. No murmur heard.    No friction rub. No gallop.  Pulmonary:     Effort: Pulmonary effort is normal. No respiratory distress.     Breath sounds: Normal breath sounds.  Abdominal:     Palpations: Abdomen is soft.     Tenderness: There is no abdominal tenderness. There is no guarding or rebound.     Hernia: No hernia is present.  Musculoskeletal:        General: No swelling.     Cervical back: Full passive range of motion without pain,  normal range of motion and neck supple. No pain with movement, spinous process tenderness or muscular tenderness. Normal range of motion.     Right lower leg: No edema.     Left lower leg: No edema.     Right Lower Extremity: Right leg is amputated below knee.     Left Lower Extremity: Left leg is amputated below knee.  Skin:    General: Skin is warm and dry.     Capillary Refill: Capillary refill takes less than 2 seconds.     Findings: No ecchymosis, erythema, lesion or wound.  Neurological:     Mental Status: He is lethargic.     Cranial Nerves: Dysarthria present.     Sensory: Sensation is intact.     Motor: Tremor present. No weakness or abnormal muscle tone.     Coordination: Coordination abnormal.  Psychiatric:        Mood and Affect: Mood normal.        Speech: Speech normal.        Behavior: Behavior normal.     (all labs ordered are listed, but only abnormal results are displayed) Labs Reviewed  CBC - Abnormal; Notable for the following components:      Result Value   WBC 12.2 (*)    RBC 4.15 (*)    Hemoglobin 12.7 (*)    HCT 35.1 (*)    MCHC 36.2 (*)    All other components within normal limits  DIFFERENTIAL - Abnormal; Notable for the following components:   Neutro Abs 10.5 (*)    Abs Immature Granulocytes 0.14 (*)    All other components within normal limits  AMMONIA - Abnormal; Notable for the following components:   Ammonia 125 (*)    All other components within normal limits  I-STAT CHEM 8, ED - Abnormal; Notable for the following components:   Sodium 109 (*)    Potassium 6.6 (*)    Chloride 67 (*)    BUN <3 (*)    Creatinine, Ser 0.50 (*)    Glucose, Bld 152 (*)    Calcium , Ion 0.96 (*)    TCO2 38 (*)    All other components within normal limits  I-STAT ARTERIAL BLOOD GAS, ED - Abnormal; Notable for the following components:   pH, Arterial 7.260 (*)    pCO2 arterial 89.3 (*)    pO2, Arterial 54 (*)    Bicarbonate 40.4 (*)    TCO2 43 (*)     Acid-Base Excess 9.0 (*)    Sodium 111 (*)    Calcium , Ion 1.13 (*)    All  other components within normal limits  CBG MONITORING, ED - Abnormal; Notable for the following components:   Glucose-Capillary 148 (*)    All other components within normal limits  CULTURE, BLOOD (SINGLE)  PROTIME-INR  APTT  ETHANOL  URINE DRUG SCREEN  URINALYSIS, W/ REFLEX TO CULTURE (INFECTION SUSPECTED)  COMPREHENSIVE METABOLIC PANEL WITH GFR  TSH  VITAMIN B12  FOLATE  BLOOD GAS, ARTERIAL  I-STAT CG4 LACTIC ACID, ED    EKG: EKG Interpretation Date/Time:  Thursday September 13 2024 03:31:41 EST Ventricular Rate:  73 PR Interval:    QRS Duration:  137 QT Interval:  431 QTC Calculation: 475 R Axis:   -72  Text Interpretation: Sinus rhythm RBBB and LAFB No significant change since last tracing Confirmed by Haze Lonni PARAS [phone]) on 09/13/2024 3:36:55 AM  Radiology: CT ANGIO HEAD NECK W WO CM (CODE STROKE) Result Date: 09/13/2024 EXAM: CTA HEAD AND NECK WITHOUT AND WITH 09/13/2024 03:16:12 AM TECHNIQUE: CTA of the head and neck was performed without and with the administration of 75 mL of intravenous iohexol  (OMNIPAQUE ) 350 MG/ML injection. Multiplanar 2D and/or 3D reformatted images are provided for review. Automated exposure control, iterative reconstruction, and/or weight based adjustment of the mA/kV was utilized to reduce the radiation dose to as low as reasonably achievable. Stenosis of the internal carotid arteries measured using NASCET criteria. COMPARISON: None available CLINICAL HISTORY: Neuro deficit, acute, stroke suspected. FINDINGS: CTA NECK: AORTIC ARCH AND ARCH VESSELS: Calcific aortic atherosclerosis. No dissection or arterial injury. No significant stenosis of the brachiocephalic or subclavian arteries. CERVICAL CAROTID ARTERIES: Atherosclerotic calcification at both carotid bifurcations. No hemodynamically significant stenosis of the internal carotid arteries. Adjacent to the proximal  right external carotid artery is a slightly hypoenhancing focus (series 6 image 194) measuring 7 mm. This is in close proximity to the right internal jugular vein and may represent a venous varix. CERVICAL VERTEBRAL ARTERIES: The vertebral arteries are left dominant. There is severe stenosis of the left vertebral artery origin due to calcific atherosclerosis. There is moderate atherosclerosis of the left vertebral artery V4 segment without hemodynamically significant stenosis. The right vertebral artery terminates in the PICA. No dissection or arterial injury. LUNGS AND MEDIASTINUM: Pulmonary emphysema. SOFT TISSUES: No acute abnormality. BONES: No acute abnormality. CTA HEAD: ANTERIOR CIRCULATION: Atherosclerotic calcification of both carotid siphons with moderate stenosis of the supraclinoid left ICA. No significant stenosis of the anterior cerebral arteries. No significant stenosis of the middle cerebral arteries. No aneurysm. POSTERIOR CIRCULATION: Tortuous course of the basilar artery. No significant stenosis of the posterior cerebral arteries. No significant stenosis of the vertebral arteries. No aneurysm. OTHER: No dural venous sinus thrombosis on this non-dedicated study. IMPRESSION: 1. No emergent large vessel occlusion. 2. 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 3. Moderate stenosis of the supraclinoid left ICA. 4. Atherosclerotic calcification at both carotid bifurcations without hemodynamically significant stenosis of the internal carotid arteries. 5. Indeterminate hypoenhancing structure lateral to the right external carotid artery. Possibly a varix arising from a branch of the right internal jugular vein. Electronically signed by: Franky Stanford MD 09/13/2024 03:30 AM EST RP Workstation: [id number]   CT HEAD CODE STROKE WO CONTRAST Result Date: 09/13/2024 EXAM: CT HEAD WITHOUT CONTRAST 09/13/2024 03:11:51 AM TECHNIQUE: CT of the head was performed without  the administration of intravenous contrast. Automated exposure control, iterative reconstruction, and/or weight based adjustment of the mA/kV was utilized to reduce the radiation dose to as low as reasonably achievable. COMPARISON: None  available. CLINICAL HISTORY: Neuro deficit, acute, stroke suspected. FINDINGS: BRAIN AND VENTRICLES: No acute hemorrhage. No evidence of acute infarct. No hydrocephalus. No extra-axial collection. No mass effect or midline shift. [place] Stroke Program Early CT Score (ASPECTS): Ganglionic (caudate, internal capsule, lentiform nucleus, insula, M1-m3): 7. Supraganglionic (m4-m6): 3. Total: 10. 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 Chronic ischemic white matter changes. ORBITS: No acute abnormality. SINUSES: No acute abnormality. SOFT TISSUES AND SKULL: No acute soft tissue abnormality. No skull fracture. IMPRESSION: 1. No acute intracranial abnormality. ASPECTS 10. 2. 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 3. Findings communicated to Dr. Salman Khaliqdina at 3:17 AM on 09/13/24. Electronically signed by: Franky Stanford MD 09/13/2024 03:18 AM EST RP Workstation: [id number]     Procedure Name: Intubation Date/Time: 09/13/2024 5:23 AM  Performed by: Haze Lonni PARAS, MDPre-anesthesia Checklist: Patient identified, Patient being monitored, Emergency Drugs available, Timeout performed and Suction available Oxygen Delivery Method: Non-rebreather mask Preoxygenation: Pre-oxygenation with 100% oxygen Induction Type: Rapid sequence Ventilation: Mask ventilation without difficulty and Two handed mask ventilation required Laryngoscope Size: Glidescope and 4 Grade View: Grade I Tube size: 7.5 mm Number of attempts: 1 Airway Equipment and Method: Video-laryngoscopy Placement Confirmation: ETT inserted through vocal cords under direct vision, CO2 detector and Breath sounds checked- equal and bilateral Secured at: 24  cm Tube secured with: ETT holder Dental Injury: Teeth and Oropharynx as per pre-operative assessment     .Critical Care  Performed by: Haze Lonni PARAS, MD Authorized by: Haze Lonni PARAS, MD   Critical care provider statement:    Critical care time (minutes):  30   Critical care time was exclusive of:  Separately billable procedures and treating other patients   Critical care was necessary to treat or prevent imminent or life-threatening deterioration of the following conditions:  Metabolic crisis, respiratory failure and CNS failure or compromise   Critical care was time spent personally by me on the following activities:  Development of treatment plan with patient or surrogate, discussions with consultants, evaluation of patient's response to treatment, examination of patient, ordering and review of laboratory studies, ordering and review of radiographic studies, ordering and performing treatments and interventions, pulse oximetry, re-evaluation of patient's condition and review of old charts   I assumed direction of critical care for this patient from another provider in my specialty: no     Care discussed with: admitting provider      Medications Ordered in the ED  propofol  (DIPRIVAN ) 1000 MG/100ML infusion (10 mcg/kg/min  88 kg Intravenous New Bag/Given 09/13/24 0522)  fentaNYL  in NS (33mcg/ml) infusion-PREMIX (100 mcg/hr Intravenous New Bag/Given 09/13/24 0521)  norepinephrine  (LEVOPHED ) 4mg  in (0.016 mg/mL) premix infusion (0 mcg/min Intravenous Hold 09/13/24 0522)  iohexol  (OMNIPAQUE ) 350 MG/ML injection 75 mL (75 mLs Intravenous Contrast Given 09/13/24 0316)  etomidate  (AMIDATE ) injection 20 mg (20 mg Intravenous Given 09/13/24 0453)  succinylcholine  (ANECTINE ) syringe 100 mg (100 mg Intravenous Given 09/13/24 0453)  phenylephrine  80 mcg/10 mL injection (80 mcg  Given 09/13/24 0521)                                    Medical Decision Making Amount and/or  Complexity of Data Reviewed External Data Reviewed: labs, ECG and notes. Labs: ordered. Decision-making details documented in ED Course. Radiology: ordered and independent interpretation performed. Decision-making details documented in ED Course. ECG/medicine tests: ordered  and independent interpretation performed. Decision-making details documented in ED Course.  Risk Prescription drug management. Decision regarding hospitalization.   Differential diagnosis considered includes, but not limited to: TIA; Stroke; ICH; Seizure; electrolyte abnormality; hypoglycemia; toxic/pharmacologic causes; CNS infection; psychiatric disorder  Presents to the emergency department as a code stroke.  Patient with fairly rapid change in his mental status at the nursing home, documented by staff.  Patient was reportedly at his normal baseline at 10:30 PM when he went outside to smoke.  Sometime after that he was noted to be very altered.  EMS evaluated him and noted that he was aphasic and initiated a code stroke.  Patient noted to have a tremor, lethargy at arrival but no focal findings.  Presentation most consistent with encephalopathy.  Patient underwent CT head and CT angiography [place] reviewed by Dr. Vanessa and no acute abnormalities were noted.  It was felt that this was not consistent with a stroke, no further neurologic workup.  Patient's medical workup for altered mental status has revealed multiple metabolic abnormalities.  Patient with respiratory acidosis with hypercarbia, significant hyponatremia and significant elevation of ammonia.  Reviewing records reveals that the patient does have a history of alcoholism.  It was felt that the patient was significantly altered and not protecting his airway.  He was not a candidate for BiPAP to treat his respiratory acidosis secondary to his altered mental status.  Family was informed of his condition and did confirm that he has a full code and agreed with  intubation which was performed without difficulty.  Patient briefly hypotensive after intubation, given IV fluid bolus and an IV dose of neo with improvement.  Will try propofol  for sedation.  May need pressors to maintain blood pressure with his IV sedation.  Discussed with Dr. Maree, on-call for critical care, will be evaluated for admission to the ICU.     Final diagnoses:  Hyponatremia  Hyperammonemia  Acute on chronic respiratory failure with hypoxia and hypercapnia [facility])    ED Discharge Orders     None          Haze Lonni PARAS, MD 09/13/24 9474    Haze Lonni PARAS, MD 09/13/24 [phone]  "

## 2024-09-13 NOTE — H&P (Signed)
 "  NAME:  Ronald [name], MRN:  [id number], DOB:  November 20, 1955, LOS: 0 ADMISSION DATE:  09/13/2024, CONSULTATION DATE:  09/13/2024 REFERRING MD:  Lonni Seats, MD, CHIEF COMPLAINT:  AMS  History of Present Illness:  69 y/o male with h/o Etoh use/abuse, SDH, COPD, Tobacco abuse disoerder, Gout, HTN, b/l BKAs, PAD who initially presented from long term care facility with confusion and was a code stroke.  LKW 2230 12/31 when he went out to smoke, later having tremors and rhythmic jerking while being drowsy.  He was seen by Neurology and felt to have metabolic derangements explaining his mental status.  TNK not given.  He was found to have a Sodium 109, K 6.6, WBC 12.2.  Because he was not able to protect his airways, he was intubated in the ED after which his BP dropped precipitously and he was started on Levophed .  Pertinent  Medical History  Etoh use/abuse, SDH, COPD, Tobacco abuse disoerder, Gout, HTN, b/l BKAs, PAD  Significant Hospital Events: Including procedures, antibiotic start and stop dates in addition to other pertinent events   09/13/24: admit to ICU  Interim History / Subjective:  N/a  Objective    Blood pressure (!) 114/57, pulse 68, temperature 97.7 F (36.5 C), temperature source Oral, resp. rate 20, height 5' 7 (1.702 m), weight 88 kg, SpO2 100%.    Vent Mode: PRVC FiO2 (%):  [100 %] 100 % Set Rate:  [24 bmp] 24 bmp Vt Set:  [530 mL] 530 mL PEEP:  [5 cmH20] 5 cmH20 Plateau Pressure:  [26 cmH20] 26 cmH20  No intake or output data in the 24 hours ending 09/13/24 0551 Filed Weights   09/13/24 0300  Weight: 88 kg    Examination: General: intubated and in mild respiratory distress HENT: reactive pupils and equal, no jvd Lungs: course b/l Cardiovascular: reg s1s2 no murmurs or rubs Abdomen: soft nt nd bs pos Extremities: b/l BKA, no edema in stumps Neuro: sedated and intubated  Resolved problem list   Assessment and Plan  Acute hypoxic respiratory  failure Intubated and sedated SAT/SBT when medically appropriate Cxr shows no infiltrates AMS Metabolic in nature from the hyponatremia Will get ETOH level May have will will have Etoh withdrawals Per CTP,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 - Moderate stenosis of the supraclinoid left ICA. -Atherosclerotic calcification at both carotid bifurcations without hemodynamically significant stenosis of the internal carotid arteries.   Hyponatremia Treatment with hypertonic saline Monitor BMPs q 4 hours, not to exceed 8-12 meq in first 12-24 hours ETOH use/abuse FA and Thiamine  Monitor for withdrawals Per ER doctor prior to intubation patient was asterixis COPD Nebs prn Tobacco abuse disorder Hold off on nicotine  patch while on sedation PAD s/p b/l BKAs stable Gout stable  Labs   CBC: Recent Labs  Lab 09/13/24 0344 09/13/24 0358 09/13/24 0406  WBC 12.2*  --   --   NEUTROABS 10.5*  --   --   HGB 12.7* 14.3 14.3  HCT 35.1* 42.0 42.0  MCV 84.6  --   --   PLT 315  --   --     Basic Metabolic Panel: Recent Labs  Lab 09/13/24 0358 09/13/24 0406  NA 109* 111*  K 6.6* 3.8  CL 67*  --   GLUCOSE 152*  --   BUN <3*  --   CREATININE 0.50*  --    GFR: Estimated Creatinine Clearance: 94.9 mL/min (A) (by C-G formula based on SCr of  0.5 mg/dL (L)). Recent Labs  Lab 09/13/24 0344 09/13/24 0358  WBC 12.2*  --   LATICACIDVEN  --  0.8    Liver Function Tests: No results for input(s): AST, ALT, ALKPHOS, BILITOT, PROT, ALBUMIN in the last 168 hours. No results for input(s): LIPASE, AMYLASE in the last 168 hours. Recent Labs  Lab 09/13/24 0344  AMMONIA 125*    ABG    Component Value Date/Time   PHART 7.260 (L) 09/13/2024 0406   PCO2ART 89.3 (HH) 09/13/2024 0406   PO2ART 54 (L) 09/13/2024 0406   HCO3 40.4 (H) 09/13/2024 0406   TCO2 43 (H) 09/13/2024 0406   ACIDBASEDEF 0.5 02/17/2024 1033   O2SAT 81  09/13/2024 0406     Coagulation Profile: Recent Labs  Lab 09/13/24 0344  INR 1.0    Cardiac Enzymes: No results for input(s): CKTOTAL, CKMB, CKMBINDEX, TROPONINI in the last 168 hours.  HbA1C: Hgb A1c MFr Bld  Date/Time Value Ref Range Status  02/17/2024 12:28 PM 5.4 4.8 - 5.6 % Final    Comment:    (NOTE) Diagnosis of Diabetes The following HbA1c ranges recommended by the American Diabetes Association (ADA) may be used as an aid in the diagnosis of diabetes mellitus.  Hemoglobin             Suggested A1C NGSP%              Diagnosis  <5.7                   Non Diabetic  5.7-6.4                Pre-Diabetic  >6.4                   Diabetic  <7.0                   Glycemic control for                       adults with diabetes.    09/02/2022 09:03 PM 5.6 4.8 - 5.6 % Final    Comment:    (NOTE)         Prediabetes: 5.7 - 6.4         Diabetes: >6.4         Glycemic control for adults with diabetes: <7.0     CBG: Recent Labs  Lab 09/13/24 0305  GLUCAP 148*    Review of Systems:   Intubated/sedated  Past Medical History:  He,  has a past medical history of Critical lower limb ischemia (HCC), ETOH abuse, Gout, and Hypertension.   Surgical History:   Past Surgical History:  Procedure Laterality Date   ABDOMINAL AORTAGRAM N/A 12/26/2013   Procedure: ABDOMINAL AORTAGRAM;  Surgeon: Gaile LELON New, MD;  Location: [facility] CATH LAB;  Service: Cardiovascular;  Laterality: N/A;   AMPUTATION Left 12/28/2013   Procedure: LEFT BELOW KNEE AMPUTATION;  Surgeon: Jerona LULLA Sage, MD;  Location: [facility] OR;  Service: Orthopedics;  Laterality: Left;   AMPUTATION Right 11/17/2022   Procedure: RIGHT BELOW KNEE AMPUTATION;  Surgeon: Sage Jerona LULLA, MD;  Location[facility] OR;  Service: Orthopedics;  Laterality: Right;   BELOW KNEE LEG AMPUTATION     Left   LEG SURGERY       Social History:   reports that he has been smoking cigarettes. He has a 80 pack-year smoking history. He has  never used smokeless tobacco. He reports current  alcohol  use of about 120.0 standard drinks of alcohol  per week. He reports current drug use. Drugs: Cocaine  and Marijuana.   Family History:  His family history includes Diabetes Mellitus II in his mother. There is no history of CAD or Stroke.   Allergies Allergies[1]   Home Medications  Prior to Admission medications  Medication Sig Start Date End Date Taking? Authorizing Provider  acetaminophen  (TYLENOL ) 500 MG tablet Take 1,000 mg by mouth 2 (two) times daily.    [provider]  amLODipine  (NORVASC ) 10 MG tablet Take 10 mg by mouth daily.    [provider]  benzonatate (TESSALON) 200 MG capsule Take 200 mg by mouth 3 (three) times daily as needed for cough.    [provider]  busPIRone  (BUSPAR ) 5 MG tablet Take 5 mg by mouth at bedtime.    [provider]  cetirizine (ZYRTEC) 10 MG tablet Take 10 mg by mouth daily.    [provider]  cholecalciferol (VITAMIN D3) 25 MCG (1000 UNIT) tablet Take 2,000 Units by mouth daily.    [provider]  docusate sodium  (COLACE) 100 MG capsule Take 100 mg by mouth daily.    [provider]  fluticasone  (FLONASE) 50 MCG/ACT nasal spray Place 1 spray into both nostrils daily.    [provider]  guaiFENesin -dextromethorphan  (ROBITUSSIN DM) 100-10 MG/5ML syrup Take 10 mLs by mouth every 6 (six) hours as needed for cough.    [provider]  ipratropium-albuterol  (DUONEB) 0.5-2.5 (3) MG/3ML SOLN Take 3 mLs by nebulization See admin instructions. Inhale 1 vial via nebulizer twice daily (scheduled). In addition, inhale 1 vial every 6 hours as needed for shortness of breath or wheezing.    [provider]  melatonin 3 MG TABS tablet Take 3 mg by mouth at bedtime.    [provider]  metoprolol  tartrate (LOPRESSOR ) 50 MG tablet Take 1 tablet (50 mg total) by mouth 2 (two) times daily. 12/03/22   Odell Celinda Balo, MD  OXYGEN Inhale 3 L/min into the lungs continuous.    [provider]  pantoprazole  (PROTONIX ) 40 MG tablet Take 1 tablet (40 mg total) by mouth daily. 09/29/22   Akula, Vijaya, MD  polyethylene glycol (MIRALAX  / GLYCOLAX ) 17 g packet Take 17 g by mouth daily as needed for moderate constipation. 04/08/24   Dorinda Drue DASEN, MD  thiamine  (VITAMIN B1) 100 MG tablet Take 1 tablet (100 mg total) by mouth daily. 09/17/22   Bryn Bernardino NOVAK, MD     Critical care time: 6   The patient is critically ill with multiple organ system failure and requires high complexity decision making for assessment and support, frequent evaluation and titration of therapies, advanced monitoring, review of radiographic studies and interpretation of complex data.   Critical Care Time devoted to patient care services, exclusive of separately billable procedures, described in this note is 32 minutes.   Orlin Fairly, MD [facility] See Amion for pager  If no response to pager , please call [phone] until 7pm After 7:00 pm call Elink  [phone] 09/13/2024, 5:51 AM            [1] No Known Allergies  "

## 2024-09-14 DIAGNOSIS — G9341 Metabolic encephalopathy: Secondary | ICD-10-CM | POA: Diagnosis not present

## 2024-09-14 DIAGNOSIS — J9622 Acute and chronic respiratory failure with hypercapnia: Secondary | ICD-10-CM

## 2024-09-14 DIAGNOSIS — I10 Essential (primary) hypertension: Secondary | ICD-10-CM

## 2024-09-14 DIAGNOSIS — Z720 Tobacco use: Secondary | ICD-10-CM | POA: Diagnosis not present

## 2024-09-14 DIAGNOSIS — J9621 Acute and chronic respiratory failure with hypoxia: Secondary | ICD-10-CM | POA: Diagnosis not present

## 2024-09-14 DIAGNOSIS — D72829 Elevated white blood cell count, unspecified: Secondary | ICD-10-CM | POA: Diagnosis not present

## 2024-09-14 DIAGNOSIS — E871 Hypo-osmolality and hyponatremia: Secondary | ICD-10-CM | POA: Diagnosis not present

## 2024-09-14 DIAGNOSIS — E7220 Disorder of urea cycle metabolism, unspecified: Secondary | ICD-10-CM

## 2024-09-14 DIAGNOSIS — I739 Peripheral vascular disease, unspecified: Secondary | ICD-10-CM | POA: Diagnosis not present

## 2024-09-14 DIAGNOSIS — Z8709 Personal history of other diseases of the respiratory system: Secondary | ICD-10-CM

## 2024-09-14 DIAGNOSIS — F1010 Alcohol abuse, uncomplicated: Secondary | ICD-10-CM | POA: Diagnosis not present

## 2024-09-14 LAB — BASIC METABOLIC PANEL WITH GFR
Anion gap: 14 (ref 5–15)
Anion gap: 15 (ref 5–15)
Anion gap: 16 — ABNORMAL HIGH (ref 5–15)
Anion gap: 15 (ref 5–15)
Anion gap: 15 (ref 5–15)
BUN: 7 mg/dL — ABNORMAL LOW (ref 8–23)
BUN: 9 mg/dL (ref 8–23)
BUN: 11 mg/dL (ref 8–23)
BUN: 11 mg/dL (ref 8–23)
BUN: 12 mg/dL (ref 8–23)
CO2: 25 mmol/L (ref 22–32)
CO2: 23 mmol/L (ref 22–32)
CO2: 21 mmol/L — ABNORMAL LOW (ref 22–32)
CO2: 24 mmol/L (ref 22–32)
CO2: 22 mmol/L (ref 22–32)
Calcium: 8.2 mg/dL — ABNORMAL LOW (ref 8.9–10.3)
Calcium: 8.1 mg/dL — ABNORMAL LOW (ref 8.9–10.3)
Calcium: 10.9 mg/dL — ABNORMAL HIGH (ref 8.9–10.3)
Calcium: 8.4 mg/dL — ABNORMAL LOW (ref 8.9–10.3)
Calcium: 8.5 mg/dL — ABNORMAL LOW (ref 8.9–10.3)
Chloride: 76 mmol/L — ABNORMAL LOW (ref 98–111)
Chloride: 79 mmol/L — ABNORMAL LOW (ref 98–111)
Chloride: 78 mmol/L — ABNORMAL LOW (ref 98–111)
Chloride: 78 mmol/L — ABNORMAL LOW (ref 98–111)
Chloride: 80 mmol/L — ABNORMAL LOW (ref 98–111)
Creatinine, Ser: 1.44 mg/dL — ABNORMAL HIGH (ref 0.61–1.24)
Creatinine, Ser: 1.64 mg/dL — ABNORMAL HIGH (ref 0.61–1.24)
Creatinine, Ser: 1.48 mg/dL — ABNORMAL HIGH (ref 0.61–1.24)
Creatinine, Ser: 1.43 mg/dL — ABNORMAL HIGH (ref 0.61–1.24)
Creatinine, Ser: 1.37 mg/dL — ABNORMAL HIGH (ref 0.61–1.24)
GFR, Estimated: 53 mL/min — ABNORMAL LOW
GFR, Estimated: 46 mL/min — ABNORMAL LOW
GFR, Estimated: 52 mL/min — ABNORMAL LOW
GFR, Estimated: 54 mL/min — ABNORMAL LOW
GFR, Estimated: 57 mL/min — ABNORMAL LOW
Glucose, Bld: 89 mg/dL (ref 70–99)
Glucose, Bld: 279 mg/dL — ABNORMAL HIGH (ref 70–99)
Glucose, Bld: 96 mg/dL (ref 70–99)
Glucose, Bld: 97 mg/dL (ref 70–99)
Glucose, Bld: 96 mg/dL (ref 70–99)
Potassium: 3.7 mmol/L (ref 3.5–5.1)
Potassium: 3.6 mmol/L (ref 3.5–5.1)
Potassium: 3.5 mmol/L (ref 3.5–5.1)
Potassium: 3.7 mmol/L (ref 3.5–5.1)
Potassium: 5.1 mmol/L (ref 3.5–5.1)
Sodium: 115 mmol/L — CL (ref 135–145)
Sodium: 116 mmol/L — CL (ref 135–145)
Sodium: 115 mmol/L — CL (ref 135–145)
Sodium: 117 mmol/L — CL (ref 135–145)
Sodium: 117 mmol/L — CL (ref 135–145)

## 2024-09-14 LAB — GLUCOSE, CAPILLARY
Glucose-Capillary: 83 mg/dL (ref 70–99)
Glucose-Capillary: 58 mg/dL — ABNORMAL LOW (ref 70–99)
Glucose-Capillary: 85 mg/dL (ref 70–99)
Glucose-Capillary: 94 mg/dL (ref 70–99)
Glucose-Capillary: 112 mg/dL — ABNORMAL HIGH (ref 70–99)
Glucose-Capillary: 162 mg/dL — ABNORMAL HIGH (ref 70–99)
Glucose-Capillary: 95 mg/dL (ref 70–99)

## 2024-09-14 LAB — OSMOLALITY: Osmolality: 248 mosm/kg — CL (ref 275–295)

## 2024-09-14 LAB — URINE DRUG SCREEN
Amphetamines: NEGATIVE
Barbiturates: NEGATIVE
Benzodiazepines: NEGATIVE
Cocaine: NEGATIVE
Fentanyl: POSITIVE — AB
Methadone Scn, Ur: NEGATIVE
Opiates: NEGATIVE
Tetrahydrocannabinol: NEGATIVE

## 2024-09-14 LAB — CBC
HCT: 30.8 % — ABNORMAL LOW (ref 39.0–52.0)
Hemoglobin: 11.5 g/dL — ABNORMAL LOW (ref 13.0–17.0)
MCH: 32 pg (ref 26.0–34.0)
MCHC: 37.3 g/dL — ABNORMAL HIGH (ref 30.0–36.0)
MCV: 85.8 fL (ref 80.0–100.0)
Platelets: 321 K/uL (ref 150–400)
RBC: 3.59 MIL/uL — ABNORMAL LOW (ref 4.22–5.81)
RDW: 12.9 % (ref 11.5–15.5)
WBC: 18.1 K/uL — ABNORMAL HIGH (ref 4.0–10.5)
nRBC: 0.7 % — ABNORMAL HIGH (ref 0.0–0.2)

## 2024-09-14 LAB — PHOSPHORUS: Phosphorus: 1.2 mg/dL — ABNORMAL LOW (ref 2.5–4.6)

## 2024-09-14 LAB — MAGNESIUM: Magnesium: 1 mg/dL — ABNORMAL LOW (ref 1.7–2.4)

## 2024-09-14 MED ORDER — CALCIUM GLUCONATE-NACL 1-0.675 GM/50ML-% IV SOLN
1.0000 g | Freq: Once | INTRAVENOUS | Status: AC
  Administered 2024-09-14: 1000 mg via INTRAVENOUS
  Filled 2024-09-14: qty 50

## 2024-09-14 MED ORDER — MAGNESIUM SULFATE 4 GM/100ML IV SOLN
4.0000 g | Freq: Once | INTRAVENOUS | Status: AC
  Administered 2024-09-14: 4 g via INTRAVENOUS
  Filled 2024-09-14: qty 100

## 2024-09-14 MED ORDER — SODIUM CHLORIDE 3 % IV SOLN
INTRAVENOUS | Status: DC
  Filled 2024-09-14 (×2): qty 500

## 2024-09-14 MED ORDER — POTASSIUM PHOSPHATES 15 MMOLE/5ML IV SOLN
45.0000 mmol | Freq: Once | INTRAVENOUS | Status: AC
  Administered 2024-09-14: 45 mmol via INTRAVENOUS
  Filled 2024-09-14: qty 15

## 2024-09-14 MED ORDER — LACTULOSE 10 GM/15ML PO SOLN
30.0000 g | Freq: Three times a day (TID) | ORAL | Status: DC
  Administered 2024-09-14 – 2024-09-17 (×9): 30 g
  Filled 2024-09-14 (×8): qty 45

## 2024-09-14 MED ORDER — ORAL CARE MOUTH RINSE
15.0000 mL | OROMUCOSAL | Status: DC
  Administered 2024-09-14 – 2024-09-17 (×36): 15 mL via OROMUCOSAL

## 2024-09-14 MED ORDER — VITAL HP 1.0 CAL PO LIQD
1000.0000 mL | ORAL | Status: DC
  Administered 2024-09-14: 1000 mL

## 2024-09-14 MED ORDER — ORAL CARE MOUTH RINSE
15.0000 mL | OROMUCOSAL | Status: DC | PRN
  Administered 2024-09-14: 15 mL via OROMUCOSAL

## 2024-09-14 MED ORDER — DEXTROSE 50 % IV SOLN
INTRAVENOUS | Status: AC
  Administered 2024-09-14: 50 mL
  Filled 2024-09-14: qty 50

## 2024-09-14 MED ORDER — PIPERACILLIN-TAZOBACTAM 3.375 G IVPB
3.3750 g | Freq: Three times a day (TID) | INTRAVENOUS | Status: DC
  Administered 2024-09-14 – 2024-09-16 (×7): 3.375 g via INTRAVENOUS
  Filled 2024-09-14 (×7): qty 50

## 2024-09-14 MED ORDER — DESMOPRESSIN ACETATE 4 MCG/ML IJ SOLN: 2.0000 ug | Freq: Once | INTRAMUSCULAR | Status: DC

## 2024-09-14 MED ORDER — SODIUM PHOSPHATES 45 MMOLE/15ML IV SOLN: 45.0000 mmol | Freq: Once | INTRAVENOUS | Status: DC

## 2024-09-14 MED ORDER — POTASSIUM CHLORIDE 20 MEQ PO PACK
40.0000 meq | PACK | Freq: Once | ORAL | Status: AC
  Administered 2024-09-14: 40 meq
  Filled 2024-09-14: qty 2

## 2024-09-14 MED ORDER — INSULIN ASPART 100 UNIT/ML IJ SOLN
0.0000 [IU] | INTRAMUSCULAR | Status: DC
  Administered 2024-09-14 – 2024-09-15 (×2): 1 [IU] via SUBCUTANEOUS
  Filled 2024-09-14 (×2): qty 1

## 2024-09-14 NOTE — Inpatient Diabetes Management (Signed)
 Inpatient Diabetes Program Recommendations  AACE/ADA: New Consensus Statement on Inpatient Glycemic Control   Target Ranges:  Prepandial:   less than 140 mg/dL      Peak postprandial:   less than 180 mg/dL (1-2 hours)      Critically ill patients:  140 - 180 mg/dL   Lab Results  Component Value Date   GLUCAP 85 09/14/2024   HGBA1C 5.4 09/13/2024    Latest Reference Range & Units 09/13/24 15:44 09/13/24 19:38 09/13/24 23:13 09/14/24 06:40 09/14/24 08:27 09/14/24 08:47  Glucose-Capillary 70 - 99 mg/dL 90 83 895 (H) 83 58 (L) 85   Review of Glycemic Control  Diabetes history: None  Outpatient Diabetes medications: None  Current orders for Inpatient glycemic control: Novolog  0-15 units Q4HRS   Inpatient Diabetes Program Recommendations:   Noted hypoglycemia.   Please consider changing correction to Novolog  0-6 units Q4HRS.   Thanks,  Lavanda Search, RN, MSN, [facility]  Inpatient Diabetes Coordinator  Pager [phone] (8a-5p)

## 2024-09-14 NOTE — Progress Notes (Signed)
 Brief Nutrition Note  Patient discussed in rounds. CCM amenable to initiation of tube feeds. Will start tube feed protocol at trickle rate today given low electrolytes.   Adult Enteral Nutrition Protocol initiated. Full assessment to follow.  OGT tube in place with tip located in stomach per xray imaging.   Admitting Dx: Hyponatremia [E87.1] Hyperammonemia [E72.20] Acute on chronic respiratory failure with hypoxia and hypercapnia (HCC) [J96.21, J96.22] Acute hypoxic respiratory failure (HCC) [J96.01]  Body mass index is 30.39 kg/m.   Labs:  Recent Labs  Lab 09/14/24 0339 09/14/24 0926 09/14/24 1451  NA 115* 116* 115*  K 3.7 3.6 3.5  CL 76* 79* 78*  CO2 25 23 21*  BUN 7* 9 11  CREATININE 1.44* 1.64* 1.48*  CALCIUM  8.2* 8.1* 10.9*  MG  --  1.0*  --   PHOS  --  1.2*  --   GLUCOSE 89 279* 96    Ronald [name], RDN, LDN Clinical Nutrition See AMiON for contact information.

## 2024-09-14 NOTE — TOC Initial Note (Signed)
 Transition of Care [facility]) - Initial/Assessment Note    Patient Details  Name: Ronald  [name] MRN: [id number] Date of Birth: 02-Apr-1956  Transition of Care [facility]) CM/SW Contact:    Lendia Dais, LCSWA Phone Number: 09/14/2024, 12:54 PM  Clinical Narrative:  Pt is not yet medically stable. Pt is currently intubated, has a peripheral IV, and on non-violent restraints which will end on 01/03.   CSW spoke to [name] (sister) to complete assessment. Pt is from [facility] and will return upon discharge.  Pt contact is [name] who states they are not the HCPOA but her and the pt's other sisters come together to make decisions for the pt.   CSW confirmed with Donnamarie of [facility] that the pt is a LTC resident who stated they were able to transfers and has the DME of a WC.  CSW will continue to monitor.   Expected Discharge Plan: Long Term Nursing Home Barriers to Discharge: Continued Medical Work up   Patient Goals and CMS Choice Patient states their goals for this hospitalization and ongoing recovery are:: pt intubated   Choice offered to / list presented to : NA      Expected Discharge Plan and Services       Living arrangements for the past 2 months: Skilled Nursing Facility                                      Prior Living Arrangements/Services Living arrangements for the past 2 months: Skilled Nursing Facility Lives with:: Facility Resident Patient language and need for interpreter reviewed:: No Do you feel safe going back to the place where you live?:  (intubated)      Need for Family Participation in Patient Care: Yes (Comment) Care giver support system in place?: Yes (comment) Current home services: DME    Activities of Daily Living      Permission Sought/Granted Permission sought to share information with : Family Supports Permission granted to share information with : No  Share Information with NAME: [name] Bullion     Permission granted to  share info w Relationship: sister  Permission granted to share info w Contact Information: [phone]  Emotional Assessment Appearance:: Appears stated age Attitude/Demeanor/Rapport: Intubated (Following Commands or Not Following Commands) Affect (typically observed): Unable to Assess   Alcohol  / Substance Use: Not Applicable Psych Involvement: No (comment)  Admission diagnosis:  Hyponatremia [E87.1] Hyperammonemia [E72.20] Acute on chronic respiratory failure with hypoxia and hypercapnia (HCC) [J96.21, J96.22] Acute hypoxic respiratory failure (HCC) [J96.01] Patient Active Problem List   Diagnosis Date Noted   Acute hypoxic respiratory failure (HCC) 09/13/2024   DOE (dyspnea on exertion) 08/07/2024   Chronic respiratory failure with hypoxia (HCC) 08/07/2024   Cigarette smoker 08/07/2024   Hypercapnic respiratory failure (HCC) 04/04/2024   Acute on chronic respiratory failure with hypoxia and hypercapnia (HCC) 02/17/2024   Acute osteomyelitis of right calcaneus (HCC) 11/10/2022   PVD (peripheral vascular disease) 11/10/2022   Respiratory failure (HCC) 10/15/2022   Chronic hypercapnic respiratory failure (HCC) 09/19/2022   Protein-calorie malnutrition, severe 09/04/2022   Acute hypercapnic respiratory failure (HCC) 09/02/2022   Hx of BKA, left (HCC) 09/02/2022   Hypotension 09/02/2022   Right bundle branch block 09/02/2022   Acute respiratory failure (HCC) 09/02/2022   Hyponatremia 03/26/2019   Altered mental status 03/26/2019   Acute hypoxemic respiratory failure (HCC) 03/26/2019   Dysphagia 03/26/2019   Acute encephalopathy  03/18/2019   COPD exacerbation (HCC) 08/29/2017   Subdural hematoma (HCC) 06/19/2014   Critical lower limb ischemia (HCC) 12/25/2013   Subacute osteomyelitis, right ankle and foot (HCC) 11/06/2013   HTN (hypertension), benign 11/06/2013   Alcohol  abuse 11/06/2013   Osteomyelitis of left foot (HCC) 11/06/2013   COPD (chronic obstructive pulmonary  disease) (HCC) 11/06/2013   Accelerated hypertension 09/25/2013   Hypokalemia 09/25/2013   Abscess of left foot 09/21/2013   Cellulitis of left foot 09/19/2013   PCP:  Duwaine Annabella SAILOR, FNP Pharmacy:  No Pharmacies Listed    Social Drivers of Health (SDOH) Social History: SDOH Screenings   Food Insecurity: No Food Insecurity (04/04/2024)  Housing: Low Risk (04/04/2024)  Transportation Needs: No Transportation Needs (04/04/2024)  Utilities: Not At Risk (04/04/2024)  Financial Resource Strain: Not on File (07/30/2022)   Received from [facility]  Physical Activity: Not on File (12/31/2021)   Received from [facility]  Social Connections: Patient Unable To Answer (04/04/2024)  Stress: Not on File (12/31/2021)   Received from [facility]  Tobacco Use: High Risk (09/03/2024)   SDOH Interventions:     Readmission Risk Interventions    02/20/2024   11:45 AM  Readmission Risk Prevention Plan  Transportation Screening Complete  PCP or Specialist Appt within 5-7 Days Complete  Home Care Screening Complete  Medication Review (RN CM) Complete

## 2024-09-14 NOTE — Progress Notes (Signed)
 PCCM Interval Progress Note  Na on recheck remains 115. Will start 3% 25 mL/hr and continue to trend Na q4h.   Rexene LOISE Blush, PA-C [facility] 09/14/2024 3:52 PM  Please see [email or web address] for pager details.  From 7A-7P if no response, please call [phone] After hours, please call ELink [phone]

## 2024-09-14 NOTE — Progress Notes (Addendum)
 "  NAME:  Ronald  [name], MRN:  [id number], DOB:  [birth year], LOS: 1 ADMISSION DATE:  09/13/2024, CONSULTATION DATE:  09/13/2024 REFERRING MD:  Lonni Seats, MD, CHIEF COMPLAINT:  AMS  History of Present Illness:  69 y/o male with h/o Etoh use/abuse, SDH, COPD, Tobacco abuse disoerder, Gout, HTN, b/l BKAs, PAD who initially presented from long term care facility with confusion and was a code stroke.  LKW 2230 12/31 when he went out to smoke, later having tremors and rhythmic jerking while being drowsy.  He was seen by Neurology and felt to have metabolic derangements explaining his mental status.  TNK not given.  He was found to have a Sodium 109, K 6.6, WBC 12.2.  Because he was not able to protect his airways, he was intubated in the ED after which his BP dropped precipitously and he was started on Levophed .  Pertinent  Medical History  Etoh use/abuse, SDH, COPD, Tobacco abuse disoerder, Gout, HTN, b/l BKAs, PAD  Significant Hospital Events: Including procedures, antibiotic start and stop dates in addition to other pertinent events   1/1 presented initially with altered mental status, hyponatremia, and eventual hypotension after intubation 1/2 remains intubated and on pressors   Interim History / Subjective:  Na rose from 109 to 117 yesterday, 3% NS stopped and received DDAVP. Intubated and sedated. Remains on levophed .   Objective    Blood pressure 110/67, pulse (!) 106, temperature (!) 100.8 F (38.2 C), resp. rate 16, height 5' 7 (1.702 m), weight 88 kg, SpO2 100%.    Vent Mode: PRVC FiO2 (%):  [40 %-50 %] 40 % Set Rate:  [16 bmp] 16 bmp Vt Set:  [400 mL] 400 mL PEEP:  [5 cmH20] 5 cmH20 Plateau Pressure:  [15 cmH20-21 cmH20] 15 cmH20   Intake/Output Summary (Last 24 hours) at 09/14/2024 1404 Last data filed at 09/14/2024 1100 Gross per 24 hour  Intake 1772.21 ml  Output 1200 ml  Net 572.21 ml   Filed Weights   09/13/24 0300  Weight: 88 kg    Examination: General: acute  on chronically ill appearing male HEENT: [place]/AT, PERRRL, ETT/OGT in place Neuro: sedated, off sedation moves all extremities spontaneously  CV: regular rate and rhythm, normal S1 and S2, no m/r/g  PULM: on mechanical ventilation, synchronous, CTAB GI: soft, non-tender, non-distended, +BS Extremities: warm, dry, no edema  Skin: no rashes or lesions   Resolved problem list  Hypotension, likely sedation related. Now off levophed .  Assessment and Plan   Acute on chronic hypoxic and hypercarbic respiratory failure; failed SBT today due to agitation and tachypnea  History of COPD, no evidence of exacerbation  Tobacco use - Continue ventilator support with lung protective strategies  - Wean PEEP and FiO2 for sats greater than 90% - Follow intermittent CXR and ABG - SAT/SBT as tolerated, mentation preclude extubation  - Ensure adequate pulmonary hygiene  - VAP bundle in place  - PAD protocol with propofol  and fentanyl   - Continue triple therapy   Acute metabolic encephalopathy, in the setting of hyponatremia and hyperammonemia, CTH with NAICP and CTA with severe stenosis of left vertebral artery and moderate stenosis of the supraclinoid left ICA, MRI brain with NAICP Concern for seizure; EEG with slow, generalized slowing  - Start lactulose and trend ammonia  - Check urine drug screen and urine 9 drug screen   Severe hyponatremia; Na 109 on presentation with associated altered mental status, rose 117 yesterday and received DDAVP now 116. Urine sodium less than  10, urine osmolality 143 - Continue to trend q4h BMP - Will start nutrition today and allow to increase gradually, avoid overcorrection   History of HTN - Holding home amlodipine  and metoprolol  given recent vasopressor requirement and relative hypotension  Leukocytosis  Pyrexia Unclear source of infection. UA negative. CXR clear.  - Continue empiric zosyn   - Send pro-calcitonin  - Follow-up blood cultures - PRN APAP   ETOH  use/abuse; ethyl alcohol <15 on admission  - Continue CIWA protocol  - May need to consider phenobarb protocol  - Continue seizure precautions - Supplement thiamine , folate, multivitamin  PAD s/p bilateral BKAs - Supportive care   Nutrition: start tube feeds DVT ppx: SQH GI ppx: IV PPI daily   Critical care time:    The patient is critically ill with multiple organ system failure and requires high complexity decision making for assessment and support, frequent evaluation and titration of therapies, advanced monitoring, review of radiographic studies and interpretation of complex data.   Critical Care Time devoted to patient care services, exclusive of separately billable procedures, described in this note is 35 minutes.  Rexene LOISE Blush, PA-C [facility] 09/14/2024 2:05 PM  Please see [email or web address] for pager details.  From 7A-7P if no response, please call [phone] After hours, please call ELink [phone]           "

## 2024-09-14 NOTE — Progress Notes (Signed)
 VENTILATOR WEAN NOTE 09/14/2024  Start Mode: PRVC  Wean Mode: Pressure Support  Duration before failure: 1 minute  Reason for failure: Other: decreased tidal volume  Notes: Patient placed on CPAP/PSV of 15/5 at 0800.  Patient's tidal volume dropped and patient was noted to be taking short, quick, shallow breaths.  Placed patient back on full support settings and is tolerating well at this time.  Will continue to monitor.

## 2024-09-15 DIAGNOSIS — E8809 Other disorders of plasma-protein metabolism, not elsewhere classified: Secondary | ICD-10-CM

## 2024-09-15 LAB — CBC
HCT: 34.7 % — ABNORMAL LOW (ref 39.0–52.0)
Hemoglobin: 12 g/dL — ABNORMAL LOW (ref 13.0–17.0)
MCH: 30.9 pg (ref 26.0–34.0)
MCHC: 34.6 g/dL (ref 30.0–36.0)
MCV: 89.4 fL (ref 80.0–100.0)
Platelets: 261 K/uL (ref 150–400)
RBC: 3.88 MIL/uL — ABNORMAL LOW (ref 4.22–5.81)
RDW: 13.1 % (ref 11.5–15.5)
WBC: 16.9 K/uL — ABNORMAL HIGH (ref 4.0–10.5)
nRBC: 0 % (ref 0.0–0.2)

## 2024-09-15 LAB — DRUG PROFILE, UR, 9 DRUGS (LABCORP)
Amphetamines, Urine: NEGATIVE ng/mL
Barbiturate, Ur: NEGATIVE ng/mL
Benzodiazepine Quant, Ur: NEGATIVE ng/mL
Cannabinoid Quant, Ur: NEGATIVE ng/mL
Cocaine (Metab.): NEGATIVE ng/mL
Creatinine, Urine: 216 mg/dL (ref 20.0–300.0)
Methadone Screen, Urine: NEGATIVE ng/mL
Nitrite Urine, Quantitative: NEGATIVE ug/mL
OPIATE SCREEN URINE: NEGATIVE ng/mL
Phencyclidine, Ur: NEGATIVE ng/mL
Propoxyphene, Urine: NEGATIVE ng/mL
pH, Urine: 5.1 (ref 4.5–8.9)

## 2024-09-15 LAB — PHOSPHORUS: Phosphorus: 4.8 mg/dL — ABNORMAL HIGH (ref 2.5–4.6)

## 2024-09-15 LAB — BASIC METABOLIC PANEL WITH GFR
Anion gap: 13 (ref 5–15)
BUN: 13 mg/dL (ref 8–23)
CO2: 25 mmol/L (ref 22–32)
Calcium: 8.6 mg/dL — ABNORMAL LOW (ref 8.9–10.3)
Chloride: 84 mmol/L — ABNORMAL LOW (ref 98–111)
Creatinine, Ser: 1.23 mg/dL (ref 0.61–1.24)
GFR, Estimated: >60 mL/min
Glucose, Bld: 109 mg/dL — ABNORMAL HIGH (ref 70–99)
Potassium: 4.1 mmol/L (ref 3.5–5.1)
Sodium: 122 mmol/L — ABNORMAL LOW (ref 135–145)

## 2024-09-15 LAB — SODIUM
Sodium: 127 mmol/L — ABNORMAL LOW (ref 135–145)
Sodium: 127 mmol/L — ABNORMAL LOW (ref 135–145)
Sodium: 124 mmol/L — ABNORMAL LOW (ref 135–145)
Sodium: 127 mmol/L — ABNORMAL LOW (ref 135–145)

## 2024-09-15 LAB — TRIGLYCERIDES: Triglycerides: 182 mg/dL — ABNORMAL HIGH

## 2024-09-15 LAB — MAGNESIUM: Magnesium: 2.4 mg/dL (ref 1.7–2.4)

## 2024-09-15 LAB — GLUCOSE, CAPILLARY
Glucose-Capillary: 115 mg/dL — ABNORMAL HIGH (ref 70–99)
Glucose-Capillary: 115 mg/dL — ABNORMAL HIGH (ref 70–99)
Glucose-Capillary: 120 mg/dL — ABNORMAL HIGH (ref 70–99)
Glucose-Capillary: 126 mg/dL — ABNORMAL HIGH (ref 70–99)
Glucose-Capillary: 133 mg/dL — ABNORMAL HIGH (ref 70–99)
Glucose-Capillary: 148 mg/dL — ABNORMAL HIGH (ref 70–99)

## 2024-09-15 LAB — AMMONIA: Ammonia: 53 umol/L — ABNORMAL HIGH (ref 9–35)

## 2024-09-15 MED ORDER — PROSOURCE TF20 ENFIT COMPATIBL EN LIQD
60.0000 mL | Freq: Two times a day (BID) | ENTERAL | Status: DC
  Administered 2024-09-15 – 2024-09-17 (×4): 60 mL
  Filled 2024-09-15 (×4): qty 60

## 2024-09-15 MED ORDER — VITAL 1.5 CAL PO LIQD
1000.0000 mL | ORAL | Status: DC
  Administered 2024-09-15 – 2024-09-16 (×2): 1000 mL

## 2024-09-15 MED ORDER — MIDAZOLAM HCL (PF) 2 MG/2ML IJ SOLN
2.0000 mg | INTRAMUSCULAR | Status: DC | PRN
  Administered 2024-09-15 – 2024-09-17 (×5): 2 mg via INTRAVENOUS
  Filled 2024-09-15 (×5): qty 2

## 2024-09-15 MED ORDER — DESMOPRESSIN ACETATE 4 MCG/ML IJ SOLN
1.0000 ug | Freq: Once | INTRAMUSCULAR | Status: AC
  Administered 2024-09-15: 1 ug via INTRAVENOUS
  Filled 2024-09-15: qty 1

## 2024-09-15 MED ORDER — DEXMEDETOMIDINE HCL IN NACL 400 MCG/100ML IV SOLN
0.0000 ug/kg/h | INTRAVENOUS | Status: DC
  Administered 2024-09-15: 0.4 ug/kg/h via INTRAVENOUS
  Administered 2024-09-15: 0.5 ug/kg/h via INTRAVENOUS
  Administered 2024-09-15: 0.6 ug/kg/h via INTRAVENOUS
  Administered 2024-09-16 (×2): 0.5 ug/kg/h via INTRAVENOUS
  Administered 2024-09-17 – 2024-09-18 (×2): 0.4 ug/kg/h via INTRAVENOUS
  Filled 2024-09-15 (×8): qty 100

## 2024-09-15 NOTE — Progress Notes (Signed)
 eLink Physician-Brief Progress Note Patient Name: Ronald  [name] DOB: 07/13/1956 MRN: [id number]   Date of Service  09/15/2024  HPI/Events of Note  Severely agitated, unable to maintain RASS goal on current sedation, fighting the vent  eICU Interventions  Add PRN versed  to maintain RASS goal   0607 -PIV sites on back, IV team unable to get new IV, on sedation with 1 leaky IV.  Referred to ground team for access, will have to wait until the morning team arrives  Intervention Category Minor Interventions: Routine modifications to care plan (e.g. PRN medications for pain, fever);Agitation / anxiety - evaluation and management  Morio Widen 09/15/2024, 8:21 PM

## 2024-09-15 NOTE — Progress Notes (Signed)
 VENTILATOR WEAN NOTE 09/15/2024  Start Mode: PRVC  Wean Mode: Pressure Support  Duration before failure: 1  Reason for failure: Other: decreased tidal volume and minute ventilation.  Notes: Patient placed on CPAP/PSV of 15/5 @0801 . Patient's tidal volumes remained less than 200. Patient placed back on PRVC.

## 2024-09-15 NOTE — Progress Notes (Signed)
 Initial Nutrition Assessment  DOCUMENTATION CODES:   Not applicable  INTERVENTION:  - Initiate tube feeding via OGT: Vital 1.5 at 45 ml/h (1080 ml per day) *Start at 52mL/hr and advance by 10mL Q12H Prosource TF20 60 ml BID Provides 1780 kcal, 113 gm protein, 825 ml free water  daily  - Monitor magnesium , potassium, and phosphorus daily for at least 3 days, MD to replete as needed, as pt is at risk for refeeding syndrome. - Continue 100mg  thiamine .  - FWF per CCM.  NUTRITION DIAGNOSIS:   Inadequate oral intake related to inability to eat as evidenced by NPO status.  GOAL:   Patient will meet greater than or equal to 90% of their needs  MONITOR:   Vent status, Labs, Weight trends, TF tolerance  REASON FOR ASSESSMENT:   Consult Enteral/tube feeding initiation and management  ASSESSMENT:   69 y.o. male with PMH alcohol  abuse, COPD, tobacco use disorder, gout, HTN, PAD, and bilateral BKA's presented as a code stroke who presented with confusion and admitted for acute on chronic respiratory failure.   1/1 Admit; Intubated 1/2 Trickle TF initiated    Patient is currently intubated on ventilator support MV: 6.9 L/min Temp (24hrs), Avg:99.3 F (37.4 C), Min:98.8 F (37.1 C), Max:100.8 F (38.2 C)  Per EMR, weight appears stable since June.   OGT xray verified in the stomach. Trickle tube feeds initiated yesterday. No noted intolerances and electrolytes have stabilized.   Discussed with MD, can begin advancing tube feeds today towards goal. Will advance slowly due to possible refeeding risk.    Medications reviewed and include: 1mg  folic acid , 100mg  thiamine , Lactulose  TID Precedex  Fentanyl   Labs reviewed:  Na 122 Phosphorus 4.8   NUTRITION - FOCUSED PHYSICAL EXAM:  Unable to obtain - RD working remotely  Diet Order:   Diet Order             Diet NPO time specified  Diet effective now                   EDUCATION NEEDS:  Not appropriate for  education at this time  Skin:  Skin Assessment: Reviewed RN Assessment  Last BM:  1/3 - type 7  Height:  Ht Readings from Last 1 Encounters:  09/13/24 5' 7 (1.702 m)   Weight:  Wt Readings from Last 1 Encounters:  09/15/24 86.5 kg   Ideal Body Weight:  58.5 kg (adjusted for bilateral BKA's)  BMI:  Body mass index is 29.87 kg/m.  Estimated Nutritional Needs:  Kcal:  1750-1900 kcals Protein:  105-120 grams Fluid:  >/= 1.9L    Trude Ned RD, LDN Contact via Secure Chat.

## 2024-09-15 NOTE — Progress Notes (Signed)
 "  NAME:  Ronald  [name], MRN:  [id number], DOB:  1955-09-29, LOS: 2 ADMISSION DATE:  09/13/2024, CONSULTATION DATE:  09/13/2024 REFERRING MD:  Lonni Seats, MD, CHIEF COMPLAINT:  AMS  History of Present Illness:  69 y/o male with h/o Etoh use/abuse, SDH, COPD, Tobacco abuse disoerder, Gout, HTN, b/l BKAs, PAD who initially presented from long term care facility with confusion and was a code stroke.  LKW 2230 12/31 when he went out to smoke, later having tremors and rhythmic jerking while being drowsy.  He was seen by Neurology and felt to have metabolic derangements explaining his mental status.  TNK not given.  He was found to have a Sodium 109, K 6.6, WBC 12.2.  Because he was not able to protect his airways, he was intubated in the ED after which his BP dropped precipitously and he was started on Levophed .  Pertinent  Medical History  Etoh use/abuse, SDH, COPD, Tobacco abuse disoerder, Gout, HTN, b/l BKAs, PAD  Significant Hospital Events: Including procedures, antibiotic start and stop dates in addition to other pertinent events   1/1 presented initially with altered mental status, hyponatremia, and eventual hypotension after intubation 1/2 remains intubated and on pressors   Interim History / Subjective:  Sodium dropped and then again started rising.  Last check showed soft sodium of 127.  Patient is sedated.  WaS briefly tried on pressure support trial which she did not seem to tolerate well. There was event where and he was shaking his legs.  It seemed like seizures.  However during this of resorts patient was able to open his eyes to command.  The events seems less likely to be seizures.  Plat 19 Vt 400.   Objective    Blood pressure (!) 88/65, pulse 100, temperature 98.4 F (36.9 C), resp. rate (!) 22, height 5' 7 (1.702 m), weight 86.5 kg, SpO2 100%.    Vent Mode: PRVC FiO2 (%):  [40 %] 40 % Set Rate:  [16 bmp] 16 bmp Vt Set:  [400 mL] 400 mL PEEP:  [5 cmH20] 5  cmH20 Plateau Pressure:  [17 cmH20-18 cmH20] 18 cmH20   Intake/Output Summary (Last 24 hours) at 09/15/2024 1517 Last data filed at 09/15/2024 1444 Gross per 24 hour  Intake 1727.83 ml  Output 975 ml  Net 752.83 ml   Filed Weights   09/13/24 0300 09/15/24 0500  Weight: 88 kg 86.5 kg    Examination: General: Elderly male intubated and sedated. HEENT secretions from both nostrils have improved. Lungs clear to auscultation bilaterally. Heart regular rate and rhythm no murmurs appreciated. Abdomen nontender nondistended bowel sounds are normal. Neuro sedated and intubated.  Off sedation he was awake and partially following commands.  CT head negative. 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  Prior basal ganglia infarct. Previous CT face had shown right maxillary sinus dehiscence. MRI with remote lacunar infarcts.  Bilateral sinusitis.  Resolved problem list  Hypotension, likely sedation related. Now off levophed .  Assessment and Plan  69 year old male with alcohol  abuse, COPD, tobacco use disorder, hypertension, PAD presented as a code stroke. Upon arrival blood gas shows hypercapnic respiratory failure.  Was subsequently intubated.  Postintubation CO2 has normalized. There was some concern for shaking and possible seizure activity.  When he arrived his sodium was 109.  He did receive multiple 3% saline boluses.  Last sodium was elevated at 119. He is currently intubated and in the ICU.  Metabolic encephalopathy: Hyperammonemia: ?  Seizure activity: -EEG negative so far. -CT head and other imaging as above. - No interventions for severe left vertebral artery occlusion or ICA stenosis per neurology.   - On lactulose .  Acute hypoxic and hypercapnic respiratory failure: History of COPD.  Not in COPD exacerbation: -Hypercapnia resolved. -Intubated.  Continue LT VV. -SAT/SBT. - Nebs ordered.   Severe  hyponatremia: -Serum osmolarity 248. -Sodium on arrival 109.  Increased to 117.  Status post DDAVP  x 1.  Currently 115.  Will start 3% hypertonic saline at slow rate > rise to 127.  Hypertonic saline discontinued, given DDAVP  x 1. -Sodium Q 4-hour .   Shock: Likely related to sedation.  On low-dose Levophed .   Acute paranasal sinusitis: -Started on Zosyn .   AKI: - will monitor with fluids.   History of HTN: - Holding home amlodipine  and metoprolol  given recent vasopressor requirement and relative hypotension  ETOH use/abuse; ethyl alcohol <15 on admission  - Continue CIWA protocol  - Continue seizure precautions - Supplement thiamine , folate, multivitamin  PAD s/p bilateral BKAs: - Supportive care   Nutrition: start tube feeds DVT ppx: SQH GI ppx: IV PPI daily   CRITICAL CARE Performed by: Sammi JONETTA Fredericks.     Total critical care time: 50 minutes   Critical care time was exclusive of separately billable procedures and treating other patients.   Critical care was necessary to treat or prevent imminent or life-threatening deterioration.   Critical care was time spent personally by me on the following activities: development of treatment plan with patient and/or surrogate as well as nursing, discussions with consultants, evaluation of patient's response to treatment, examination of patient, obtaining history from patient or surrogate, ordering and performing treatments and interventions, ordering and review of laboratory studies, ordering and review of radiographic studies, pulse oximetry, re-evaluation of patient's condition and participation in multidisciplinary rounds.  Sammi JONETTA Fredericks, MD Pulmonary, Critical Care and Sleep Attending.   09/15/2024, 3:22 PM          "

## 2024-09-16 ENCOUNTER — Other Ambulatory Visit: Payer: Self-pay

## 2024-09-16 DIAGNOSIS — F1010 Alcohol abuse, uncomplicated: Secondary | ICD-10-CM | POA: Diagnosis not present

## 2024-09-16 DIAGNOSIS — N179 Acute kidney failure, unspecified: Secondary | ICD-10-CM | POA: Diagnosis not present

## 2024-09-16 DIAGNOSIS — E7220 Disorder of urea cycle metabolism, unspecified: Secondary | ICD-10-CM

## 2024-09-16 DIAGNOSIS — E871 Hypo-osmolality and hyponatremia: Secondary | ICD-10-CM | POA: Diagnosis not present

## 2024-09-16 DIAGNOSIS — I739 Peripheral vascular disease, unspecified: Secondary | ICD-10-CM | POA: Diagnosis not present

## 2024-09-16 DIAGNOSIS — G9341 Metabolic encephalopathy: Secondary | ICD-10-CM | POA: Diagnosis not present

## 2024-09-16 DIAGNOSIS — J9601 Acute respiratory failure with hypoxia: Secondary | ICD-10-CM | POA: Diagnosis not present

## 2024-09-16 DIAGNOSIS — R579 Shock, unspecified: Secondary | ICD-10-CM

## 2024-09-16 LAB — SODIUM
Sodium: 127 mmol/L — ABNORMAL LOW (ref 135–145)
Sodium: 132 mmol/L — ABNORMAL LOW (ref 135–145)

## 2024-09-16 LAB — BASIC METABOLIC PANEL WITH GFR
Anion gap: 8 (ref 5–15)
BUN: 11 mg/dL (ref 8–23)
CO2: 31 mmol/L (ref 22–32)
Calcium: 8.5 mg/dL — ABNORMAL LOW (ref 8.9–10.3)
Chloride: 88 mmol/L — ABNORMAL LOW (ref 98–111)
Creatinine, Ser: 0.65 mg/dL (ref 0.61–1.24)
GFR, Estimated: >60 mL/min
Glucose, Bld: 138 mg/dL — ABNORMAL HIGH (ref 70–99)
Potassium: 4 mmol/L (ref 3.5–5.1)
Sodium: 127 mmol/L — ABNORMAL LOW (ref 135–145)

## 2024-09-16 LAB — CBC
HCT: 30.2 % — ABNORMAL LOW (ref 39.0–52.0)
Hemoglobin: 10.4 g/dL — ABNORMAL LOW (ref 13.0–17.0)
MCH: 30.9 pg (ref 26.0–34.0)
MCHC: 34.4 g/dL (ref 30.0–36.0)
MCV: 89.6 fL (ref 80.0–100.0)
Platelets: 233 K/uL (ref 150–400)
RBC: 3.37 MIL/uL — ABNORMAL LOW (ref 4.22–5.81)
RDW: 13.2 % (ref 11.5–15.5)
WBC: 14.4 K/uL — ABNORMAL HIGH (ref 4.0–10.5)
nRBC: 0 % (ref 0.0–0.2)

## 2024-09-16 LAB — GLUCOSE, CAPILLARY
Glucose-Capillary: 102 mg/dL — ABNORMAL HIGH (ref 70–99)
Glucose-Capillary: 110 mg/dL — ABNORMAL HIGH (ref 70–99)
Glucose-Capillary: 133 mg/dL — ABNORMAL HIGH (ref 70–99)
Glucose-Capillary: 115 mg/dL — ABNORMAL HIGH (ref 70–99)
Glucose-Capillary: 132 mg/dL — ABNORMAL HIGH (ref 70–99)
Glucose-Capillary: 143 mg/dL — ABNORMAL HIGH (ref 70–99)

## 2024-09-16 LAB — MAGNESIUM: Magnesium: 2 mg/dL (ref 1.7–2.4)

## 2024-09-16 LAB — PHOSPHORUS: Phosphorus: 2.3 mg/dL — ABNORMAL LOW (ref 2.5–4.6)

## 2024-09-16 LAB — AMMONIA: Ammonia: 47 umol/L — ABNORMAL HIGH (ref 9–35)

## 2024-09-16 MED ORDER — SODIUM CHLORIDE 0.9% FLUSH
10.0000 mL | Freq: Two times a day (BID) | INTRAVENOUS | Status: DC
  Administered 2024-09-16: 10 mL
  Administered 2024-09-16: 30 mL
  Administered 2024-09-17: 40 mL
  Administered 2024-09-17 – 2024-09-19 (×5): 10 mL
  Administered 2024-09-20: 20 mL

## 2024-09-16 MED ORDER — FENTANYL BOLUS VIA INFUSION
25.0000 ug | INTRAVENOUS | Status: DC | PRN
  Administered 2024-09-17: 25 ug via INTRAVENOUS

## 2024-09-16 MED ORDER — K PHOS MONO-SOD PHOS DI & MONO 155-852-130 MG PO TABS
500.0000 mg | ORAL_TABLET | ORAL | Status: AC
  Administered 2024-09-16 (×3): 500 mg
  Filled 2024-09-16 (×3): qty 2

## 2024-09-16 MED ORDER — SODIUM CHLORIDE 0.9% FLUSH: 10.0000 mL | INTRAVENOUS | Status: DC | PRN

## 2024-09-16 MED ORDER — NOREPINEPHRINE 4 MG/250ML-% IV SOLN
0.0000 ug/min | INTRAVENOUS | Status: DC
  Administered 2024-09-16: 2 ug/min via INTRAVENOUS
  Filled 2024-09-16: qty 250

## 2024-09-16 MED ORDER — SODIUM CHLORIDE 0.9 % IV SOLN
3.0000 g | Freq: Four times a day (QID) | INTRAVENOUS | Status: DC
  Administered 2024-09-16 – 2024-09-20 (×17): 3 g via INTRAVENOUS
  Filled 2024-09-16 (×20): qty 8

## 2024-09-16 NOTE — Progress Notes (Signed)
 Peripherally Inserted Central Catheter Placement  The IV Nurse has discussed with the patient and/or persons authorized to consent for the patient, the purpose of this procedure and the potential benefits and risks involved with this procedure.  The benefits include less needle sticks, lab draws from the catheter, and the patient may be discharged home with the catheter. Risks include, but not limited to, infection, bleeding, blood clot (thrombus formation), and puncture of an artery; nerve damage and irregular heartbeat and possibility to perform a PICC exchange if needed/ordered by physician.  Alternatives to this procedure were also discussed.  Bard Power PICC patient education guide, fact sheet on infection prevention and patient information card has been provided to patient /or left at bedside.  Telephone consent obtained from sister.  PICC Placement Documentation  PICC Triple Lumen 09/16/24 Right Basilic 42 cm 0 cm (Active)  Indication for Insertion or Continuance of Line Vasoactive infusions 09/16/24 0900  Exposed Catheter (cm) 0 cm 09/16/24 0900  Site Assessment Clean, Dry, Intact 09/16/24 0900  Lumen #1 Status Flushed;Saline locked;Blood return noted 09/16/24 0900  Lumen #2 Status Flushed;Saline locked;Blood return noted 09/16/24 0900  Lumen #3 Status Flushed;Saline locked;Blood return noted 09/16/24 0900  Dressing Type Transparent;Securing device 09/16/24 0900  Dressing Status Antimicrobial disc/dressing in place;Clean, Dry, Intact 09/16/24 0900  Line Care Connections checked and tightened 09/16/24 0900  Line Adjustment (NICU/IV Team Only) No 09/16/24 0900  Dressing Intervention New dressing;Adhesive placed at insertion site (IV team only);Adhesive placed around edges of dressing (IV team/ICU RN only) 09/16/24 0900  Dressing Change Due 09/23/24 09/16/24 0900       Ronald [name] 09/16/2024, 9:01 AM

## 2024-09-16 NOTE — Plan of Care (Signed)
" °  Problem: Safety: Goal: Non-violent Restraint(s) Outcome: Not Progressing   Problem: Education: Goal: Ability to describe self-care measures that may prevent or decrease complications (Diabetes Survival Skills Education) will improve Outcome: Not Progressing   Problem: Fluid Volume: Goal: Ability to maintain a balanced intake and output will improve Outcome: Progressing   Problem: Nutritional: Goal: Maintenance of adequate nutrition will improve Outcome: Progressing   Problem: Skin Integrity: Goal: Risk for impaired skin integrity will decrease Outcome: Progressing   Problem: Tissue Perfusion: Goal: Adequacy of tissue perfusion will improve Outcome: Progressing   "

## 2024-09-16 NOTE — Progress Notes (Signed)
 "  NAME:  Ronald [name], MRN:  [id number], DOB:  [birth year], LOS: 3 ADMISSION DATE:  09/13/2024, CONSULTATION DATE:  09/13/2024 REFERRING MD:  Lonni Seats, MD, CHIEF COMPLAINT:  AMS  History of Present Illness:  69 y/o male with h/o Etoh use/abuse, SDH, COPD, Tobacco abuse disoerder, Gout, HTN, b/l BKAs, PAD who initially presented from long term care facility with confusion and was a code stroke.  LKW 2230 12/31 when he went out to smoke, later having tremors and rhythmic jerking while being drowsy.  He was seen by Neurology and felt to have metabolic derangements explaining his mental status.  TNK not given.  He was found to have a Sodium 109, K 6.6, WBC 12.2.  Because he was not able to protect his airways, he was intubated in the ED after which his BP dropped precipitously and he was started on Levophed .  Pertinent  Medical History  Etoh use/abuse, SDH, COPD, Tobacco abuse disoerder, Gout, HTN, b/l BKAs, PAD  Significant Hospital Events: Including procedures, antibiotic start and stop dates in addition to other pertinent events   1/1 presented initially with altered mental status, hyponatremia, and eventual hypotension after intubation 1/4 remains intubated and low-dose pressors.  Interim History / Subjective:  Did not tolerate SBT in the morning.  On low-dose pressors.  Sodium has been stable for at least 24 hours.  Weaning off sedation to reassess  Objective    Blood pressure (!) 104/58, pulse 69, temperature 98.6 F (37 C), resp. rate 18, height 5' 7 (1.702 m), weight 86.9 kg, SpO2 100%.    Vent Mode: PRVC FiO2 (%):  [40 %] 40 % Set Rate:  [16 bmp] 16 bmp Vt Set:  [400 mL] 400 mL PEEP:  [5 cmH20] 5 cmH20 Plateau Pressure:  [15 cmH20-19 cmH20] 15 cmH20   Intake/Output Summary (Last 24 hours) at 09/16/2024 1035 Last data filed at 09/16/2024 1009 Gross per 24 hour  Intake 1543.7 ml  Output 1000 ml  Net 543.7 ml   Filed Weights   09/13/24 0300 09/15/24 0500 09/16/24 0415   Weight: 88 kg 86.5 kg 86.9 kg    Examination: General: Elderly male, intubated, unresponsive, no acute distress Lungs : Coarse ventilator breath sounds Heart : RRR, S1, normal S2 Abdomen : Soft, nontender, nondistended Neuro: Intubated, not following commands, does not awake even to pain  CT head negative. 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  Prior basal ganglia infarct. Previous CT face had shown right maxillary sinus dehiscence. MRI with remote lacunar infarcts.  Bilateral sinusitis.  Resolved problem list  Hypotension, likely sedation related.   Assessment and Plan  69 year old male with alcohol  abuse, COPD, tobacco use disorder, hypertension, PAD presented as a code stroke. Upon arrival blood gas shows hypercapnic respiratory failure.  Was subsequently intubated.  Postintubation CO2 has normalized. There was some concern for shaking and possible seizure activity.  When he arrived his sodium was 109.  He did receive multiple 3% saline boluses.  Since then his CO2 has been fixed and his sodium is stable at 127.  Metabolic encephalopathy: Hyperammonemia: ?  Seizure activity: -EEG negative so far. -CT head and other imaging as above. - No interventions for severe left vertebral artery occlusion or ICA stenosis per neurology.   - On lactulose . - Supportive management until more awake for extubation  Acute hypoxic and hypercapnic respiratory failure: History of COPD.  Not in COPD exacerbation: -Hypercapnia resolved. -Intubated.  Continue LT VV. -  Full vent support -LTVV, 4-8cc/kg IBW with goal Pplat<30 and DP<15 - Daily evaluation for SBT and extubation - Daily evaluation for sedation weaning trials     Severe hyponatremia: -Serum osmolarity 248. -Sodium on arrival 109.  Increased to 117.  Status post DDAVP  x 1.  Currently 115.  Will start 3% hypertonic saline at slow rate > rise to 127.  Hypertonic saline  discontinued, given DDAVP  x 1. - Can space out sodium checks to twice daily   Shock: Hypotension likely sedation related   Acute paranasal sinusitis: -Started on Zosyn , will transition to Unasyn  and continue for 10 days.  If there is significant clinical improvement, can switch to Augmentin  eventually   AKI: - will monitor with fluids.   History of HTN: - Holding home amlodipine  and metoprolol  given recent vasopressor requirement and relative hypotension  ETOH use/abuse; ethyl alcohol <15 on admission  - Continue CIWA protocol  - Continue seizure precautions - Supplement thiamine , folate, multivitamin  PAD s/p bilateral BKAs: - Supportive care   Nutrition: Continue tube feeds DVT ppx: SQH GI ppx: IV PPI daily    Patient Lines/Drains/Airways Status     Active Line/Drains/Airways     Name Placement date Placement time Site Days   PICC Triple Lumen 09/16/24 Right Basilic 42 cm 0 cm 09/16/24  9140  -- less than 1   NG/OG Vented/Dual Lumen 18 Fr. Oral 09/13/24  0500  Oral  3   Urethral Catheter katey rn Temperature probe 16 Fr. 09/13/24  0500  Temperature probe  3   Airway 7.5 mm 09/13/24  0500  -- 3             The patient is critically ill due to acute hypoxic respiratory failure requiring mechanical ventilation.  Critical care was necessary to treat or prevent imminent or life-threatening deterioration. Critical care time was spent by me on the following activities: development of a treatment plan with the patient and/or surrogate as well as nursing, discussions with consultants, evaluation of the patient's response to treatment, examination of the patient, obtaining a history from the patient or surrogate, ordering and performing treatments and interventions, ordering and review of laboratory studies, ordering and review of radiographic studies, review of telemetry data including pulse oximetry, re-evaluation of patient's condition and participation in multidisciplinary  rounds.   I personally spent 53 minutes providing critical care not including any separately billable procedures.   Zola LOISE Herter, MD [facility] 09/16/2024 10:40 AM          "

## 2024-09-16 NOTE — Plan of Care (Signed)
  Problem: Safety: Goal: Non-violent Restraint(s) Outcome: Progressing   Problem: Coping: Goal: Ability to adjust to condition or change in health will improve Outcome: Progressing

## 2024-09-16 NOTE — Progress Notes (Signed)
 Pharmacy Electrolyte Replacement  Recent Labs:  Recent Labs    09/16/24 0906  K 4.0  MG 2.0  PHOS 2.3*  CREATININE 0.65    Low Critical Values (K </= 2.5, Phos </= 1, Mg </= 1) Present: None  MD Contacted: n/a  Plan: K Phos  Neutral q4h x 3 doses per protocol  Rankin Sams, PharmD, BCPS, BCCCP Clinical Pharmacist

## 2024-09-16 NOTE — Progress Notes (Signed)
 VENTILATOR WEAN NOTE 09/16/2024  Start Mode: PRVC  Wean Mode: Pressure Support  Duration before failure: 1 min  Reason for failure: Apnea  Notes: RT placed patient on CPAP/PSV patient failed due to apnea. RT will continue to monitor.

## 2024-09-16 NOTE — Progress Notes (Signed)
 Dear Doctor: Haze This patient has been identified as a candidate for PICC for the following reason (s): IV therapy over 48 hours, drug extravasation potential with tissue necrosis (KCL, Dilantin , Dopamine, CaCl, MgSO4, chemo vesicant), poor veins/poor circulatory system (CHF, COPD, emphysema, diabetes, steroid use, IV drug abuse, etc.), and incompatible drugs (aminophyllin, TPN, heparin , given with an antibiotic) If you agree, please write an order for the indicated device. For any questions contact the Vascular Access Team at [phone] if no answer, please leave a message.  Thank you for supporting the early vascular access assessment program.

## 2024-09-17 DIAGNOSIS — J9602 Acute respiratory failure with hypercapnia: Secondary | ICD-10-CM

## 2024-09-17 DIAGNOSIS — F1010 Alcohol abuse, uncomplicated: Secondary | ICD-10-CM | POA: Diagnosis not present

## 2024-09-17 DIAGNOSIS — G9341 Metabolic encephalopathy: Secondary | ICD-10-CM | POA: Diagnosis not present

## 2024-09-17 DIAGNOSIS — I10 Essential (primary) hypertension: Secondary | ICD-10-CM | POA: Diagnosis not present

## 2024-09-17 DIAGNOSIS — E871 Hypo-osmolality and hyponatremia: Secondary | ICD-10-CM | POA: Diagnosis not present

## 2024-09-17 DIAGNOSIS — J9601 Acute respiratory failure with hypoxia: Secondary | ICD-10-CM | POA: Diagnosis not present

## 2024-09-17 DIAGNOSIS — J449 Chronic obstructive pulmonary disease, unspecified: Secondary | ICD-10-CM | POA: Diagnosis not present

## 2024-09-17 DIAGNOSIS — E7220 Disorder of urea cycle metabolism, unspecified: Secondary | ICD-10-CM | POA: Diagnosis not present

## 2024-09-17 DIAGNOSIS — J019 Acute sinusitis, unspecified: Secondary | ICD-10-CM

## 2024-09-17 DIAGNOSIS — I739 Peripheral vascular disease, unspecified: Secondary | ICD-10-CM | POA: Diagnosis not present

## 2024-09-17 LAB — CBC
HCT: 29.6 % — ABNORMAL LOW (ref 39.0–52.0)
Hemoglobin: 9.8 g/dL — ABNORMAL LOW (ref 13.0–17.0)
MCH: 30.4 pg (ref 26.0–34.0)
MCHC: 33.1 g/dL (ref 30.0–36.0)
MCV: 91.9 fL (ref 80.0–100.0)
Platelets: 293 K/uL (ref 150–400)
RBC: 3.22 MIL/uL — ABNORMAL LOW (ref 4.22–5.81)
RDW: 13.3 % (ref 11.5–15.5)
WBC: 10.2 K/uL (ref 4.0–10.5)
nRBC: 0 % (ref 0.0–0.2)

## 2024-09-17 LAB — BASIC METABOLIC PANEL WITH GFR
Anion gap: 7 (ref 5–15)
BUN: 9 mg/dL (ref 8–23)
CO2: 34 mmol/L — ABNORMAL HIGH (ref 22–32)
Calcium: 8.6 mg/dL — ABNORMAL LOW (ref 8.9–10.3)
Chloride: 93 mmol/L — ABNORMAL LOW (ref 98–111)
Creatinine, Ser: 0.59 mg/dL — ABNORMAL LOW (ref 0.61–1.24)
GFR, Estimated: >60 mL/min
Glucose, Bld: 145 mg/dL — ABNORMAL HIGH (ref 70–99)
Potassium: 3.5 mmol/L (ref 3.5–5.1)
Sodium: 134 mmol/L — ABNORMAL LOW (ref 135–145)

## 2024-09-17 LAB — GLUCOSE, CAPILLARY
Glucose-Capillary: 108 mg/dL — ABNORMAL HIGH (ref 70–99)
Glucose-Capillary: 136 mg/dL — ABNORMAL HIGH (ref 70–99)
Glucose-Capillary: 119 mg/dL — ABNORMAL HIGH (ref 70–99)
Glucose-Capillary: 123 mg/dL — ABNORMAL HIGH (ref 70–99)
Glucose-Capillary: 107 mg/dL — ABNORMAL HIGH (ref 70–99)
Glucose-Capillary: 115 mg/dL — ABNORMAL HIGH (ref 70–99)

## 2024-09-17 LAB — AMMONIA: Ammonia: 46 umol/L — ABNORMAL HIGH (ref 9–35)

## 2024-09-17 LAB — PHOSPHORUS: Phosphorus: 3.7 mg/dL (ref 2.5–4.6)

## 2024-09-17 LAB — MAGNESIUM: Magnesium: 2 mg/dL (ref 1.7–2.4)

## 2024-09-17 LAB — SODIUM
Sodium: 136 mmol/L (ref 135–145)
Sodium: 138 mmol/L (ref 135–145)

## 2024-09-17 MED ORDER — FOLIC ACID 1 MG PO TABS
1.0000 mg | ORAL_TABLET | Freq: Every day | ORAL | Status: DC
  Filled 2024-09-17: qty 1

## 2024-09-17 MED ORDER — ORAL CARE MOUTH RINSE: 15.0000 mL | OROMUCOSAL | Status: DC | PRN

## 2024-09-17 MED ORDER — THIAMINE MONONITRATE 100 MG PO TABS
100.0000 mg | ORAL_TABLET | Freq: Every day | ORAL | Status: DC
  Filled 2024-09-17: qty 1

## 2024-09-17 MED ORDER — DILTIAZEM HCL 25 MG/5ML IV SOLN
20.0000 mg | INTRAVENOUS | Status: AC
  Administered 2024-09-17: 20 mg via INTRAVENOUS
  Filled 2024-09-17: qty 5

## 2024-09-17 MED ORDER — OLANZAPINE 10 MG IM SOLR
5.0000 mg | Freq: Once | INTRAMUSCULAR | Status: AC
  Administered 2024-09-17: 5 mg via INTRAVENOUS
  Filled 2024-09-17: qty 10

## 2024-09-17 MED ORDER — STERILE WATER FOR INJECTION IJ SOLN
INTRAMUSCULAR | Status: AC
  Administered 2024-09-17: 10 mL
  Filled 2024-09-17: qty 10

## 2024-09-17 MED ORDER — METOPROLOL TARTRATE 5 MG/5ML IV SOLN
5.0000 mg | INTRAVENOUS | Status: DC | PRN
  Administered 2024-09-17: 5 mg via INTRAVENOUS
  Filled 2024-09-17: qty 5

## 2024-09-17 MED ORDER — POTASSIUM CHLORIDE 20 MEQ PO PACK
40.0000 meq | PACK | Freq: Once | ORAL | Status: AC
  Administered 2024-09-17: 40 meq
  Filled 2024-09-17: qty 2

## 2024-09-17 MED ORDER — ORAL CARE MOUTH RINSE
15.0000 mL | OROMUCOSAL | Status: DC
  Administered 2024-09-17 – 2024-09-20 (×12): 15 mL via OROMUCOSAL

## 2024-09-17 MED ORDER — LABETALOL HCL 5 MG/ML IV SOLN: 10.0000 mg | INTRAVENOUS | Status: DC | PRN

## 2024-09-17 NOTE — TOC Progression Note (Signed)
 Transition of Care [facility]) - Progression Note    Patient Details  Name: Roczen  Meroney MRN: [id number] Date of Birth: [birth year]  [facility] CM/SW Contact  Lendia Dais, [place] Phone Number: 09/17/2024, 3:50 PM  Clinical Narrative:  Pt is not yet medically stable. Pt was just extubated and being monitored.  CSW will continue to follow for medical progression.    Expected Discharge Plan: Long Term Nursing Home Barriers to Discharge: Continued Medical Work up               Expected Discharge Plan and Services       Living arrangements for the past 2 months: Skilled Nursing Facility                                       Social Drivers of Health (SDOH) Interventions SDOH Screenings   Food Insecurity: No Food Insecurity (04/04/2024)  Housing: Low Risk (04/04/2024)  Transportation Needs: No Transportation Needs (04/04/2024)  Utilities: Not At Risk (04/04/2024)  Financial Resource Strain: Not on File (07/30/2022)   Received from [facility]  Physical Activity: Not on File (12/31/2021)   Received from [facility]  Social Connections: Patient Unable To Answer (04/04/2024)  Stress: Not on File (12/31/2021)   Received from [facility]  Tobacco Use: High Risk (09/03/2024)    Readmission Risk Interventions    02/20/2024   11:45 AM  Readmission Risk Prevention Plan  Transportation Screening Complete  PCP or Specialist Appt within 5-7 Days Complete  Home Care Screening Complete  Medication Review (RN CM) Complete

## 2024-09-17 NOTE — Progress Notes (Signed)
 [facility] ADULT ICU REPLACEMENT PROTOCOL   The patient does apply for the [facility] Adult ICU Electrolyte Replacment Protocol based on the criteria listed below:   1.Exclusion criteria: TCTS, ECMO, Dialysis, and Myasthenia Gravis patients 2. Is GFR >/= 30 ml/min? Yes.    Patient's GFR today is >60 3. Is SCr </= 2? Yes.   Patient's SCr is 0.59 mg/dL 4. Did SCr increase >/= 0.5 in 24 hours? No. 5.Pt's weight >40kg  Yes.   6. Abnormal electrolyte(s): K  7. Electrolytes replaced per protocol 8.  Call MD STAT for K+ </= 2.5, Phos </= 1, or Mag </= 1 Physician:  Haze Hunter BRAVO Maxene Byington 09/17/2024 3:18 AM

## 2024-09-17 NOTE — Progress Notes (Signed)
 "  NAME:  Ronald  [name], MRN:  [id number], DOB:  10-02-1955, LOS: 4 ADMISSION DATE:  09/13/2024, CONSULTATION DATE:  09/13/2024 REFERRING MD:  Lonni Seats, MD, CHIEF COMPLAINT:  AMS  History of Present Illness:  69 y/o male with h/o Etoh use/abuse, SDH, COPD, Tobacco abuse disoerder, Gout, HTN, b/l BKAs, PAD who initially presented from long term care facility with confusion and was a code stroke.  LKW 2230 12/31 when he went out to smoke, later having tremors and rhythmic jerking while being drowsy.  He was seen by Neurology and felt to have metabolic derangements explaining his mental status.  TNK not given.  He was found to have a Sodium 109, K 6.6, WBC 12.2.  Because he was not able to protect his airways, he was intubated in the ED after which his BP dropped precipitously and he was started on Levophed .  Pertinent  Medical History  Etoh use/abuse, SDH, COPD, Tobacco abuse disoerder, Gout, HTN, b/l BKAs, PAD  Significant Hospital Events: Including procedures, antibiotic start and stop dates in addition to other pertinent events   1/1 presented initially with altered mental status, hyponatremia, and eventual hypotension after intubation 1/4 remains intubated and low-dose pressors, did not tolerate SBT   Interim History / Subjective:  NAEON but received versed ? Unclear if he had agitation episode. Not awake enough for SBT with low MV. Turned fentanyl  off and down on precedex . Will try again shortly. Levo off.  Objective    Blood pressure 107/62, pulse 78, temperature 99.9 F (37.7 C), resp. rate 16, height 5' 7 (1.702 m), weight 87.8 kg, SpO2 100%.    Vent Mode: PRVC FiO2 (%):  [40 %] 40 % Set Rate:  [16 bmp] 16 bmp Vt Set:  [400 mL] 400 mL PEEP:  [5 cmH20] 5 cmH20 Plateau Pressure:  [14 cmH20-15 cmH20] 14 cmH20   Intake/Output Summary (Last 24 hours) at 09/17/2024 0730 Last data filed at 09/17/2024 0600 Gross per 24 hour  Intake 1881.9 ml  Output 2270 ml  Net -388.1 ml    Filed Weights   09/15/24 0500 09/16/24 0415 09/17/24 0232  Weight: 86.5 kg 86.9 kg 87.8 kg    Examination: General: older male, acutely ill appearing, no distress  Lungs : ctab, vented, synchronous  Heart : s1s2, no murmur, rub, gallop, no edema, warm Abdomen: protuberant, soft, non distended  Neuro: flickers eyes to voice, does not follow commands, does not respond to painful stimulus   CT head negative. 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  Prior basal ganglia infarct. Previous CT face had shown right maxillary sinus dehiscence. MRI with remote lacunar infarcts.  Bilateral sinusitis.  Resolved problem list  hypotension AKI Assessment and Plan  69 year old male with alcohol  abuse, COPD, tobacco use disorder, hypertension, PAD presented as a code stroke. Upon arrival blood gas shows hypercapnic respiratory failure.  Was subsequently intubated.  Postintubation CO2 has normalized. There was some concern for shaking and possible seizure activity.  When he arrived his sodium was 109.  He did receive multiple 3% saline boluses.  Since then his CO2 has been fixed and his sodium is stable at 127.  Metabolic encephalopathy: Hyperammonemia: ?  Seizure activity: EEG negative CT head and other imaging as above; MRI w/ remote infarcts  TSH, B12, folate, UDS and send out UDS negative  No interventions for severe left vertebral artery occlusion or ICA stenosis per neurology.   - unclear why ongoing encephalopathy but  turned sedation in half today, will try to wake up and better assess mental status when he is not so sedated  - lactulose  30mg  TID  - sodium corrected  - Supportive management until more awake for extubation  Acute hypoxic and hypercapnic respiratory failure: History of COPD.  Not in COPD exacerbation: - brovana , pulmicort , yupelri  - full mechanical vent support - lung protective ventilation 6-8cc/kg Vt - VAP  and PAD bundle in place  - titrate FiO2 to sat goal >92  - maintain peak/plats <30, driving pressures <84    Severe hyponatremia: -Serum osmolarity 248. -Sodium on arrival 109.  Increased to 117.  Status post DDAVP  x 1.  Currently 115.  Will start 3% hypertonic saline at slow rate > rise to 127.  Hypertonic saline discontinued, given DDAVP  x 1. - Can space out sodium checks to twice daily   Acute paranasal sinusitis: - initially on zosyn   - Unasyn  3g q6h for 7 days (1/11)  History of HTN: Presented in shock.  - holding home meds until clinically appropriate. Off levo this morning.   ETOH use/abuse; ethyl alcohol <15 on admission  - no indications of withdrawal; has been off propofol . Will monitor clinically. Can institute CIWA if showing signs  - Continue seizure precautions - Supplement thiamine , folate, multivitamin  PAD s/p bilateral BKAs: - Supportive care   Nutrition: Continue tube feeds DVT ppx: SQH GI ppx: IV PPI daily    Patient Lines/Drains/Airways Status     Active Line/Drains/Airways     Name Placement date Placement time Site Days   PICC Triple Lumen 09/16/24 Right Basilic 42 cm 0 cm 09/16/24  9140  -- 1   NG/OG Vented/Dual Lumen 18 Fr. Oral 09/13/24  0500  Oral  4   Urethral Catheter katey rn Temperature probe 16 Fr. 09/13/24  0500  Temperature probe  4   Fecal Management System --  --  -- --   Airway 7.5 mm 09/13/24  0500  -- 4            CC time: 82  The patient is critically ill with multiple organ system failure and requires high complexity decision making for assessment and support, frequent evaluation and titration of therapies, advanced monitoring, review of radiographic studies and interpretation of complex data.    Critical Care Time devoted to patient care services, exclusive of separately billable procedures, described in this note is 34   Tinnie FORBES Adolph DEVONNA [facility] 09/17/2024 7:33 AM  Please see [email or web address] for  pager details.  From 7A-7P if no response, please call [phone] After hours, please call ELink [phone]        "

## 2024-09-17 NOTE — Progress Notes (Addendum)
 eLink Physician-Brief Progress Note Patient Name: Ronald [name] DOB: 1956-08-29 MRN: [id number]   Date of Service  09/17/2024  HPI/Events of Note  extubated today and is trying to remove all his equipment  eICU Interventions  Restraints for patient safety   2253 -patient seems to be hallucinating/confused, no enteral access, recently on Precedex  infusion.  Add olanzapine  IV times once.  Clarification on blood pressure goals-Target normotension, IV labetalol  added as needed  Intervention Category Minor Interventions: Agitation / anxiety - evaluation and management  Naida Escalante 09/17/2024, 9:36 PM

## 2024-09-17 NOTE — Plan of Care (Signed)
  Problem: Safety: Goal: Non-violent Restraint(s) Outcome: Progressing   Problem: Coping: Goal: Ability to adjust to condition or change in health will improve Outcome: Progressing

## 2024-09-17 NOTE — Progress Notes (Signed)
 Extubated this afternoon.  HR was up to 160 while off sedation and intubated. Remained elevated post extubation. Query if his dex was masking not having home beta blocker. Given diltiazem  20mg  IV push with improvement to 130s. After he passes bedside swallow will restart his metoprolol .   Tinnie FORBES Adolph DEVONNA [facility] 09/17/2024 3:28 PM  Please see [email or web address] for pager details.  From 7A-7P if no response, please call [phone] After hours, please call ELink [phone]

## 2024-09-17 NOTE — Procedures (Signed)
 Extubation Procedure Note  Patient Details:   Name: Ronald [name] DOB: 1956/04/24 MRN: [id number]   Airway Documentation:    Vent end date: 09/17/24 Vent end time: 1414   Evaluation  O2 sats: stable throughout Complications: No apparent complications Patient did tolerate procedure well. Bilateral Breath Sounds: Clear, Diminished   Yes  RT extubated patient to 4L [place] per MD order with RN at bedside. Cuff leak slight and CCM okay with continuing with extubation. No stridor or distress noted at this time. RT will continue to monitor as needed.   Marton Malizia T Kechia Yahnke 09/17/2024, 2:15 PM

## 2024-09-18 LAB — CULTURE, BLOOD (SINGLE)
Culture: NO GROWTH
Special Requests: ADEQUATE

## 2024-09-18 LAB — CBC
HCT: 23.1 % — ABNORMAL LOW (ref 39.0–52.0)
Hemoglobin: 7.6 g/dL — ABNORMAL LOW (ref 13.0–17.0)
MCH: 30.4 pg (ref 26.0–34.0)
MCHC: 32.9 g/dL (ref 30.0–36.0)
MCV: 92.4 fL (ref 80.0–100.0)
Platelets: 257 K/uL (ref 150–400)
RBC: 2.5 MIL/uL — ABNORMAL LOW (ref 4.22–5.81)
RDW: 13.4 % (ref 11.5–15.5)
WBC: 8 K/uL (ref 4.0–10.5)
nRBC: 0 % (ref 0.0–0.2)

## 2024-09-18 LAB — ABO/RH: ABO/RH(D): B POS

## 2024-09-18 LAB — BASIC METABOLIC PANEL WITH GFR
Anion gap: 8 (ref 5–15)
BUN: 7 mg/dL — ABNORMAL LOW (ref 8–23)
CO2: 36 mmol/L — ABNORMAL HIGH (ref 22–32)
Calcium: 8.7 mg/dL — ABNORMAL LOW (ref 8.9–10.3)
Chloride: 94 mmol/L — ABNORMAL LOW (ref 98–111)
Creatinine, Ser: 0.57 mg/dL — ABNORMAL LOW (ref 0.61–1.24)
GFR, Estimated: >60 mL/min
Glucose, Bld: 93 mg/dL (ref 70–99)
Potassium: 3.9 mmol/L (ref 3.5–5.1)
Sodium: 138 mmol/L (ref 135–145)

## 2024-09-18 LAB — GLUCOSE, CAPILLARY
Glucose-Capillary: 89 mg/dL (ref 70–99)
Glucose-Capillary: 88 mg/dL (ref 70–99)
Glucose-Capillary: 70 mg/dL (ref 70–99)
Glucose-Capillary: 114 mg/dL — ABNORMAL HIGH (ref 70–99)
Glucose-Capillary: 75 mg/dL (ref 70–99)

## 2024-09-18 LAB — MAGNESIUM: Magnesium: 1.3 mg/dL — ABNORMAL LOW (ref 1.7–2.4)

## 2024-09-18 LAB — TYPE AND SCREEN
ABO/RH(D): B POS
Antibody Screen: NEGATIVE

## 2024-09-18 LAB — HEMOGLOBIN AND HEMATOCRIT, BLOOD
HCT: 28.5 % — ABNORMAL LOW (ref 39.0–52.0)
Hemoglobin: 9.3 g/dL — ABNORMAL LOW (ref 13.0–17.0)

## 2024-09-18 LAB — PHOSPHORUS: Phosphorus: 2.4 mg/dL — ABNORMAL LOW (ref 2.5–4.6)

## 2024-09-18 LAB — AMMONIA: Ammonia: 39 umol/L — ABNORMAL HIGH (ref 9–35)

## 2024-09-18 MED ORDER — FOLIC ACID 1 MG PO TABS
1.0000 mg | ORAL_TABLET | Freq: Every day | ORAL | Status: DC
  Administered 2024-09-18 – 2024-09-20 (×3): 1 mg via ORAL
  Filled 2024-09-18 (×2): qty 1

## 2024-09-18 MED ORDER — ADULT MULTIVITAMIN W/MINERALS CH
1.0000 | ORAL_TABLET | Freq: Every day | ORAL | Status: DC
  Administered 2024-09-18 – 2024-09-20 (×3): 1 via ORAL
  Filled 2024-09-18 (×3): qty 1

## 2024-09-18 MED ORDER — BUSPIRONE HCL 5 MG PO TABS
5.0000 mg | ORAL_TABLET | Freq: Two times a day (BID) | ORAL | Status: DC
  Administered 2024-09-18 – 2024-09-20 (×5): 5 mg via ORAL
  Filled 2024-09-18 (×6): qty 1

## 2024-09-18 MED ORDER — LACTULOSE 10 GM/15ML PO SOLN: 20.0000 g | Freq: Every day | ORAL | Status: DC | PRN

## 2024-09-18 MED ORDER — ALBUTEROL SULFATE (2.5 MG/3ML) 0.083% IN NEBU: 2.5000 mg | INHALATION_SOLUTION | RESPIRATORY_TRACT | Status: DC | PRN

## 2024-09-18 MED ORDER — METOPROLOL TARTRATE 25 MG PO TABS
25.0000 mg | ORAL_TABLET | Freq: Once | ORAL | Status: AC
  Administered 2024-09-18: 25 mg via ORAL
  Filled 2024-09-18: qty 1

## 2024-09-18 MED ORDER — METOPROLOL TARTRATE 25 MG PO TABS
25.0000 mg | ORAL_TABLET | Freq: Two times a day (BID) | ORAL | Status: DC
  Administered 2024-09-18: 25 mg via ORAL
  Filled 2024-09-18: qty 1

## 2024-09-18 MED ORDER — PANTOPRAZOLE SODIUM 40 MG PO TBEC
40.0000 mg | DELAYED_RELEASE_TABLET | Freq: Every day | ORAL | Status: DC
  Administered 2024-09-18 – 2024-09-19 (×2): 40 mg via ORAL
  Filled 2024-09-18 (×2): qty 1

## 2024-09-18 MED ORDER — METOPROLOL TARTRATE 50 MG PO TABS
50.0000 mg | ORAL_TABLET | Freq: Two times a day (BID) | ORAL | Status: DC
  Administered 2024-09-18 – 2024-09-20 (×4): 50 mg via ORAL
  Filled 2024-09-18 (×4): qty 1

## 2024-09-18 MED ORDER — THIAMINE MONONITRATE 100 MG PO TABS
100.0000 mg | ORAL_TABLET | Freq: Every day | ORAL | Status: DC
  Administered 2024-09-18 – 2024-09-20 (×3): 100 mg via ORAL
  Filled 2024-09-18 (×2): qty 1

## 2024-09-18 MED ORDER — MAGNESIUM SULFATE 4 GM/100ML IV SOLN
4.0000 g | Freq: Once | INTRAVENOUS | Status: AC
  Administered 2024-09-18: 4 g via INTRAVENOUS
  Filled 2024-09-18: qty 100

## 2024-09-18 MED ORDER — AMLODIPINE BESYLATE 10 MG PO TABS
10.0000 mg | ORAL_TABLET | Freq: Every day | ORAL | Status: DC
  Administered 2024-09-18 – 2024-09-20 (×3): 10 mg via ORAL
  Filled 2024-09-18 (×3): qty 1

## 2024-09-18 NOTE — Plan of Care (Signed)
  Problem: Pain Managment: Goal: General experience of comfort will improve and/or be controlled Outcome: Progressing   Problem: Safety: Goal: Ability to remain free from injury will improve Outcome: Progressing

## 2024-09-18 NOTE — Evaluation (Signed)
 Physical Therapy Evaluation Patient Details Name: Ronald  [name] MRN: [id number] DOB: 06/11/1956 Today's Date: 09/18/2024  History of Present Illness  Pt is 69 yo presenting to [facility] on 1/1 due to confusion with suspected metabolic encephalopathy in setting of hyponatremia. Pt was intubated on arrival and extubated 1/5. PMH: polysubstance abuse, cocaine , COPD, chronic hypoxemic respiratory failure, alcohol  abuse, bilateral BKA, chronic hyponatremia, gout, HTN  Clinical Impression  Pt is currently presenting at Min A for bed mobility, close SBA for scooting EOB. Pt reports he comes from SNF but unsure which one and has some confusion if he if in the hospital or not. Pt gets agitated with orientation questions and on prior level of function. Pt is most likely close to baseline. Due to pt current functional status, home set up and available assistance at home recommending skilled physical therapy services < 3 hours/day in order to address strength, balance and functional mobility to decrease risk for falls, injury, immobility, skin break down and re-hospitalization.          If plan is discharge home, recommend the following: A little help with walking and/or transfers;Assist for transportation;Assistance with cooking/housework;Supervision due to cognitive status   Can travel by private vehicle   No    Equipment Recommendations None recommended by PT     Functional Status Assessment Patient has had a recent decline in their functional status and demonstrates the ability to make significant improvements in function in a reasonable and predictable amount of time.     Precautions / Restrictions Precautions Precautions: Fall Recall of Precautions/Restrictions: Impaired Restrictions Weight Bearing Restrictions Per Provider Order: No      Mobility  Bed Mobility Overal bed mobility: Needs Assistance Bed Mobility: Supine to Sit, Sit to Supine     Supine to sit: Min assist, HOB elevated, Used  rails Sit to supine: Supervision   General bed mobility comments: Min A for trunk to midline and heavy use of bed rails. Supervision for getting back to supine from sitting.    Transfers Overall transfer level: Needs assistance Equipment used: None       General transfer comment: Pt was able to scoot up EOB at close stand by assist for ~ 1 foot with good use of bil UE      Balance Overall balance assessment: Mild deficits observed, not formally tested       Pertinent Vitals/Pain Pain Assessment Pain Assessment: No/denies pain    Home Living Family/patient expects to be discharged to:: Skilled nursing facility       Additional Comments: Per chart, pt is a long-term resident at a SNF.  per pt but pt is unsure which one    Prior Function Prior Level of Function : Patient poor historian/Family not available;Needs assist             Mobility Comments: Pt contradicts himself at times and easily agitated with questions, limiting ability to obtain PLOF info. Per pt, pt needs intermittent assistance for bed mobility, relying heavily on bed rails to pull up to sit EOB. Pt reports he normally gets out of bed with slide board independently ADLs Comments: likely needed assistance for ADLs, pt easily agitated with questions so unable to obtain all PLOF info     Extremity/Trunk Assessment   Upper Extremity Assessment Upper Extremity Assessment: Overall WFL for tasks assessed    Lower Extremity Assessment Lower Extremity Assessment: Overall WFL for tasks assessed;RLE deficits/detail;LLE deficits/detail RLE Deficits / Details: BKA LLE Deficits / Details: BKA  Cervical / Trunk Assessment Cervical / Trunk Assessment: Kyphotic  Communication   Communication Communication: No apparent difficulties    Cognition Arousal: Alert Behavior During Therapy: WFL for tasks assessed/performed, Agitated   PT - Cognitive impairments: Difficult to assess, No family/caregiver present to  determine baseline Difficult to assess due to:  (pt gets agitated with questions and deflects)     Following commands: Intact       Cueing Cueing Techniques: Verbal cues, Visual cues     General Comments General comments (skin integrity, edema, etc.): Vitals remained WNL On 6L HFNC during session.        Assessment/Plan    PT Assessment Patient needs continued PT services  PT Problem List Decreased activity tolerance;Decreased mobility;Decreased balance       PT Treatment Interventions DME instruction;Therapeutic exercise;Balance training;Functional mobility training;Therapeutic activities;Patient/family education;Wheelchair mobility training    PT Goals (Current goals can be found in the Care Plan section)  Acute Rehab PT Goals PT Goal Formulation: Patient unable to participate in goal setting Time For Goal Achievement: 10/02/24 Potential to Achieve Goals: Fair Additional Goals Additional Goal #1: 50 ft with supervision for W/C mobility with bil UE technique.    Frequency Min 1X/week        AM-PAC PT 6 Clicks Mobility  Outcome Measure Help needed turning from your back to your side while in a flat bed without using bedrails?: A Little Help needed moving from lying on your back to sitting on the side of a flat bed without using bedrails?: A Little Help needed moving to and from a bed to a chair (including a wheelchair)?: A Little Help needed standing up from a chair using your arms (e.g., wheelchair or bedside chair)?: Total Help needed to walk in hospital room?: Total Help needed climbing 3-5 steps with a railing? : Total 6 Click Score: 12    End of Session   Activity Tolerance: Patient tolerated treatment well Patient left: in bed;with call bell/phone within reach;with bed alarm set Nurse Communication: Mobility status PT Visit Diagnosis: Other abnormalities of gait and mobility (R26.89)    Time: 8957-8945 PT Time Calculation (min) (ACUTE ONLY): 12  min   Charges:   PT Evaluation $PT Eval Low Complexity: 1 Low   PT General Charges $$ ACUTE PT VISIT: 1 Visit       Dorothyann Maier, DPT, CLT  Acute Rehabilitation Services Office: [phone] (Secure chat preferred)   Dorothyann VEAR Maier 09/18/2024, 11:14 AM

## 2024-09-18 NOTE — TOC Progression Note (Signed)
 Transition of Care [facility]) - Progression Note    Patient Details  Name: Ronald [name]  Amor MRN: [id number] Date of Birth: [birth year]  [facility] CM/SW Contact  Lendia Dais, [place] Phone Number: 09/18/2024, 1:37 PM  Clinical Narrative:  Pt is not yet medically stable. Pt is on 6L of high flow O2. Pt is being monitored for 2 hours off precedex  prior to transfer.  CSW will continue to follow.     Expected Discharge Plan: Long Term Nursing Home Barriers to Discharge: Continued Medical Work up               Expected Discharge Plan and Services       Living arrangements for the past 2 months: Skilled Nursing Facility                                       Social Drivers of Health (SDOH) Interventions SDOH Screenings   Food Insecurity: Patient Unable To Answer (09/17/2024)  Housing: Patient Unable To Answer (09/17/2024)  Transportation Needs: Patient Unable To Answer (09/17/2024)  Utilities: Not At Risk (04/04/2024)  Financial Resource Strain: Not on File (07/30/2022)   Received from [facility]  Physical Activity: Not on File (12/31/2021)   Received from [facility]  Social Connections: Patient Unable To Answer (09/17/2024)  Stress: Not on File (12/31/2021)   Received from [facility]  Tobacco Use: High Risk (09/03/2024)    Readmission Risk Interventions    02/20/2024   11:45 AM  Readmission Risk Prevention Plan  Transportation Screening Complete  PCP or Specialist Appt within 5-7 Days Complete  Home Care Screening Complete  Medication Review (RN CM) Complete

## 2024-09-18 NOTE — Progress Notes (Signed)
 SCD order discontinued. Pt is a bil BKA

## 2024-09-18 NOTE — Progress Notes (Signed)
 "  NAME:  Ronald  [name], MRN:  [id number], DOB:  [birth year], LOS: 5 ADMISSION DATE:  09/13/2024, CONSULTATION DATE:  09/13/2024 REFERRING MD:  Lonni Seats, MD, CHIEF COMPLAINT:  AMS  History of Present Illness:  69 y/o male with h/o Etoh use/abuse, SDH, COPD, Tobacco abuse disoerder, Gout, HTN, b/l BKAs, PAD who initially presented from long term care facility with confusion and was a code stroke.  LKW 2230 12/31 when he went out to smoke, later having tremors and rhythmic jerking while being drowsy.  He was seen by Neurology and felt to have metabolic derangements explaining his mental status.  TNK not given.  He was found to have a Sodium 109, K 6.6, WBC 12.2.  Because he was not able to protect his airways, he was intubated in the ED after which his BP dropped precipitously and he was started on Levophed .  Pertinent  Medical History  Etoh use/abuse, SDH, COPD, Tobacco abuse disoerder, Gout, HTN, b/l BKAs, PAD  Significant Hospital Events: Including procedures, antibiotic start and stop dates in addition to other pertinent events   1/1 presented initially with altered mental status, hyponatremia, and eventual hypotension after intubation 1/4 remains intubated and low-dose pressors, did not tolerate SBT  1/5 off pressors, extubated   1/1 CT head negative. 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  Prior basal ganglia infarct. Previous CT face had shown right maxillary sinus dehiscence. 1/1 MRI with remote lacunar infarcts.  Bilateral sinusitis.  Interim History / Subjective:  S/p zyprexa  5mg  and wrist restraints last evening for hallucinations, attempting to get OOB and pulling at lines Remains on dex 0.4.  pt denies pain or SOB  Objective    Blood pressure (!) 96/57, pulse 75, temperature 98.8 F (37.1 C), resp. rate 15, height 5' 7 (1.702 m), weight 83 kg, SpO2 98%.    Vent Mode: CPAP;PSV FiO2 (%):  [40 %] 40 % Set  Rate:  [16 bmp] 16 bmp Vt Set:  [400 mL] 400 mL PEEP:  [5 cmH20] 5 cmH20 Pressure Support:  [10 cmH20] 10 cmH20 Plateau Pressure:  [15 cmH20-18 cmH20] 18 cmH20   Intake/Output Summary (Last 24 hours) at 09/18/2024 0719 Last data filed at 09/18/2024 0600 Gross per 24 hour  Intake 2052.71 ml  Output 1595 ml  Net 457.71 ml   Filed Weights   09/16/24 0415 09/17/24 0232 09/18/24 0500  Weight: 86.9 kg 87.8 kg 83 kg    Examination: Dex 0.3 General:  Older male lying in bed in NAD, bilateral soft wrist restraints in place HEENT: MM pink/moist, pupils 3/r Neuro: lethargic, awakens to verbal/ touch, oriented to person, place, month/ year, MAE CV: rr, NSR, RUE PICC PULM:  coarse upper airways, diminished bases, good cough, on 7-9L salter HFNC GI: soft, bs+, NT, foley, FMS Extremities: warm/dry, BLE BKA  Skin: no rashes   Labs> phos 2.4, Mag 1.3, ammonia 46> 39, H/H 9.8/29.6> 7.6/ 23 Afebrile  UOP 1.5L/ 24hrs +452ml Net +1.2L  Patient Lines/Drains/Airways Status     Active Line/Drains/Airways     Name Placement date Placement time Site Days   PICC Triple Lumen 09/16/24 Right Basilic 42 cm 0 cm 09/16/24  9140  -- 2   Urethral Catheter katey rn Temperature probe 16 Fr. 09/13/24  0500  Temperature probe  5   Fecal Management System --  --  -- --           Resolved problem list  hypotension  AKI  Assessment and Plan  69 year old male with alcohol  abuse, COPD, tobacco use disorder, hypertension, PAD presented as a code stroke.  ABG c/w hypercapnic respiratory failure, subsequently intubated with normalization of ABG.  There was some concern for shaking and possible seizure activity.  When he arrived his sodium was 109.  He did receive multiple 3% saline boluses and DDAVP  with stabilization in Na.   Metabolic encephalopathy Hyperammonemia Concern for Seizure activity EEG negative CT head and other imaging as above; MRI w/ remote infarcts  TSH, B12, folate normal.  UDS 1/2  positive for fentanyl .  Ammonia 125 No interventions for severe left vertebral artery occlusion or ICA stenosis per neurology.   - improving mental status, currently lethargic but oriented x3 - cont to wean precedex  - ammonia continues to improve > 39.   Currently no enteral access pending SLP vs needing cortrak.  Resume reduced dose lactulose  given stool 1.4L yest when able - trend Na on labs - cont supportive care / delirium precautions - does not need restraints at this time> monitor - d/c foley and try to establish PIVs and then hopefully d/c PICC    Acute on hypoxic and hypercapnic respiratory failure History of COPD.  Not in COPD exacerbation.  3L baseline O2  - cont to wean supplemental O2 for sat goal > 90%, currently on 7-9L salter HFNC - cont brovana , pulmicort , yupelri , prn albuterol  - aggressive pulm hygiene- IS, OOB w/PT/ OT  Severe hyponatremia, resolved - pending BMET   Acute paranasal sinusitis: - cont unasyn  for 7 days (stop date 1/11) - trend WBC /fever curve   History of HTN: - off pressors since 1/5 am - remains normotensive off meds.  Prn labetalol   ETOH use/abuse; ethyl alcohol <15 on admission  - cont to monitor signs of ETOH withdrawal, day 5 - empiric thiamine , folate, MVI  PAD s/p bilateral BKAs: - Supportive care   Hypomagnesemia Hypophosphatemia  - Mag 4gm ordered - pending K, will replete with phos  Normocytic anemia - H/H 10.4/ 30.2> 9.8/ 29.6> 7.6/ 23.1.  no obvious signs of bleeding.  Keep PPI for now, recheck H/H and send type & screen.    GERD - IV PPI  Nutrition: NPO pending bedside swallow vs SLP DVT ppx: [facility]   Labs   CBC: Recent Labs  Lab 09/13/24 0344 09/13/24 0358 09/14/24 0339 09/15/24 0314 09/16/24 0317 09/17/24 0231 09/18/24 0527  WBC 12.2*  --  18.1* 16.9* 14.4* 10.2 8.0  NEUTROABS 10.5*  --   --   --   --   --   --   HGB 12.7*   < > 11.5* 12.0* 10.4* 9.8* 7.6*  HCT 35.1*   < > 30.8* 34.7* 30.2* 29.6* 23.1*  MCV  84.6  --  85.8 89.4 89.6 91.9 92.4  PLT 315  --  321 261 233 293 257   < > = values in this interval not displayed.    Basic Metabolic Panel: Recent Labs  Lab 09/14/24 0926 09/14/24 1451 09/14/24 1838 09/14/24 2258 09/15/24 0314 09/15/24 1036 09/16/24 0906 09/16/24 0907 09/16/24 2152 09/17/24 0231 09/17/24 1037 09/17/24 2136 09/18/24 0527  NA 116*   < > 117* 117* 122*   < > 127* 127* 132* 134* 136 138  --   K 3.6   < > 3.7 5.1 4.1  --  4.0  --   --  3.5  --   --   --   CL 79*   < > 78*  80* 84*  --  88*  --   --  93*  --   --   --   CO2 23   < > 24 22 25   --  31  --   --  34*  --   --   --   GLUCOSE 279*   < > 97 96 109*  --  138*  --   --  145*  --   --   --   BUN 9   < > 11 12 13   --  11  --   --  9  --   --   --   CREATININE 1.64*   < > 1.43* 1.37* 1.23  --  0.65  --   --  0.59*  --   --   --   CALCIUM  8.1*   < > 8.4* 8.5* 8.6*  --  8.5*  --   --  8.6*  --   --   --   MG 1.0*  --   --   --  2.4  --  2.0  --   --  2.0  --   --  1.3*  PHOS 1.2*  --   --   --  4.8*  --  2.3*  --   --  3.7  --   --  2.4*   < > = values in this interval not displayed.   GFR: Estimated Creatinine Clearance: 92.4 mL/min (A) (by C-G formula based on SCr of 0.59 mg/dL (L)). Recent Labs  Lab 09/13/24 0358 09/14/24 0339 09/15/24 0314 09/16/24 0317 09/17/24 0231 09/18/24 0527  WBC  --    < > 16.9* 14.4* 10.2 8.0  LATICACIDVEN 0.8  --   --   --   --   --    < > = values in this interval not displayed.    Liver Function Tests: Recent Labs  Lab 09/13/24 0440  AST 18  ALT 6  ALKPHOS 84  BILITOT 0.7  PROT 8.0  ALBUMIN 4.8   No results for input(s): LIPASE, AMYLASE in the last 168 hours. Recent Labs  Lab 09/13/24 0344 09/15/24 0314 09/16/24 0906 09/17/24 0231 09/18/24 0527  AMMONIA 125* 53* 47* 46* 39*    ABG    Component Value Date/Time   PHART 7.539 (H) 09/13/2024 1001   PCO2ART 41.2 09/13/2024 1001   PO2ART 292 (H) 09/13/2024 1001   HCO3 34.9 (H) 09/13/2024 1001    TCO2 36 (H) 09/13/2024 1001   ACIDBASEDEF 0.5 02/17/2024 1033   O2SAT 100 09/13/2024 1001     Coagulation Profile: Recent Labs  Lab 09/13/24 0344  INR 1.0    Cardiac Enzymes: No results for input(s): CKTOTAL, CKMB, CKMBINDEX, TROPONINI in the last 168 hours.  HbA1C: Hgb A1c MFr Bld  Date/Time Value Ref Range Status  09/13/2024 10:42 AM 5.4 4.8 - 5.6 % Final    Comment:    (NOTE) Diagnosis of Diabetes The following HbA1c ranges recommended by the American Diabetes Association (ADA) may be used as an aid in the diagnosis of diabetes mellitus.  Hemoglobin             Suggested A1C NGSP%              Diagnosis  <5.7                   Non Diabetic  5.7-6.4  Pre-Diabetic  >6.4                   Diabetic  <7.0                   Glycemic control for                       adults with diabetes.    02/17/2024 12:28 PM 5.4 4.8 - 5.6 % Final    Comment:    (NOTE) Diagnosis of Diabetes The following HbA1c ranges recommended by the American Diabetes Association (ADA) may be used as an aid in the diagnosis of diabetes mellitus.  Hemoglobin             Suggested A1C NGSP%              Diagnosis  <5.7                   Non Diabetic  5.7-6.4                Pre-Diabetic  >6.4                   Diabetic  <7.0                   Glycemic control for                       adults with diabetes.      CBG: Recent Labs  Lab 09/17/24 1154 09/17/24 1605 09/17/24 1929 09/17/24 2307 09/18/24 0318  GLUCAP 119* 123* 107* 115* 89    Allergies Allergies[1]   Home Medications  Prior to Admission medications  Medication Sig Start Date End Date Taking? Authorizing Provider  acetaminophen  (TYLENOL ) 500 MG tablet Take 1,000 mg by mouth 2 (two) times daily.   Yes [provider]  amLODipine  (NORVASC ) 10 MG tablet Take 10 mg by mouth daily.   Yes [provider]  benzonatate (TESSALON) 200 MG capsule Take 200 mg by mouth every 8 (eight)  hours as needed for cough.   Yes [provider]  budesonide  (PULMICORT ) 0.25 MG/2ML nebulizer solution Take 0.25 mg by nebulization in the morning and at bedtime.   Yes [provider]  busPIRone  (BUSPAR ) 5 MG tablet Take 5 mg by mouth in the morning and at bedtime.   Yes [provider]  cetirizine (ZYRTEC) 10 MG tablet Take 10 mg by mouth daily.   Yes [provider]  cholecalciferol (VITAMIN D3) 25 MCG (1000 UNIT) tablet Take 2,000 Units by mouth daily.   Yes [provider]  docusate sodium  (COLACE) 100 MG capsule Take 100 mg by mouth daily.   Yes [provider]  famotidine  (PEPCID ) 20 MG tablet Take 20 mg by mouth at bedtime.   Yes [provider]  fluticasone  (FLONASE) 50 MCG/ACT nasal spray Place 1 spray into both nostrils daily.   Yes [provider]  guaiFENesin -dextromethorphan  (ROBITUSSIN DM) 100-10 MG/5ML syrup Take 10 mLs by mouth every 6 (six) hours as needed for cough.   Yes [provider]  ipratropium-albuterol  (DUONEB) 0.5-2.5 (3) MG/3ML SOLN Take 3 mLs by nebulization 4 (four) times daily. Inhale 1 vial via nebulizer 4 times daily (scheduled) - AND - inhale 1 vial (for 15 minutes only) every 6 hours as needed for wheezing or shortness of breath.   Yes [provider]  melatonin 3 MG TABS tablet  Take 3 mg by mouth at bedtime.   Yes [provider]  metoprolol  tartrate (LOPRESSOR ) 50 MG tablet Take 1 tablet (50 mg total) by mouth 2 (two) times daily. 12/03/22  Yes Odell Celinda Balo, MD  omeprazole  (PRILOSEC) 20 MG capsule Take 40 mg by mouth 2 (two) times daily.   Yes [provider]  OXYGEN Inhale 3 L/min into the lungs continuous.   Yes [provider]  polyethylene glycol (MIRALAX  / GLYCOLAX ) 17 g packet Take 17 g by mouth daily as needed for moderate constipation. 04/08/24  Yes Dorinda Drue DASEN, MD  thiamine  (VITAMIN B1) 100 MG tablet Take 1 tablet (100 mg total)  by mouth daily. 09/17/22  Yes Bryn Bernardino NOVAK, MD          CRITICAL CARE Performed by: Lyle Pesa   Total critical care time: 35 minutes  Critical care time was exclusive of separately billable procedures and treating other patients.  Critical care was necessary to treat or prevent imminent or life-threatening deterioration.  Critical care was time spent personally by me on the following activities: development of treatment plan with patient and/or surrogate as well as nursing, discussions with consultants, evaluation of patient's response to treatment, examination of patient, obtaining history from patient or surrogate, ordering and performing treatments and interventions, ordering and review of laboratory studies, ordering and review of radiographic studies, pulse oximetry and re-evaluation of patient's condition.   Lyle Pesa, NP [facility] 09/18/2024, 7:20 AM  See Amion for pager If no response to pager , please call [phone] until 7pm After 7:00 pm call Elink  [phone]        [1] No Known Allergies  "

## 2024-09-18 NOTE — Progress Notes (Signed)
 Nutrition Follow-up  DOCUMENTATION CODES:   Not applicable  INTERVENTION:  Continue regular diet as ordered Double protein portions with meals Continue folvite , MVI and thiamine  with history of alcohol  and drug use  NUTRITION DIAGNOSIS:   Inadequate oral intake related to inability to eat as evidenced by NPO status. - progressing  GOAL:  Patient will meet greater than or equal to 90% of their needs - progressing  MONITOR:   Vent status, Labs, Weight trends, TF tolerance  REASON FOR ASSESSMENT:   Consult Enteral/tube feeding initiation and management  ASSESSMENT:   69 y.o. male with PMH alcohol  abuse, COPD, tobacco use disorder, gout, HTN, PAD, and bilateral BKA's presented as a code stroke who presented with confusion and admitted for acute on chronic respiratory failure.  1/1 Admit; Intubated 1/2 Trickle TF initiated 1/5 extubated   Mentation much improved today.  Passed yale swallow and put on regular diet.   Spoke with patient at bedside. He states that he is hungry and ready to have something to eat. Provided menu and obtained diet order for lunch and dinner.   He reports that PTA he had a good appetite but sometimes he did not enjoy the food at the facility he was living in. He continued to consume 3 meals per day, plus snacks.   He reports that his weight has been about 170 lbs but endorses recent weight gain d/t intake of soda, desserts and snacks.   No additional nutrition related concerns at this time.  Admit weight: 88 kg Current weight: 83 kg + edema: mild pitting generalized, BUE  Medications: folvite , SSI 0-6 q4h, protonix , thiamine , IV abx  Labs:  Chloride 94 BUN 7 Cr 0.57 Phosphorus 2.4 Mg 1.3 Ammonia 39 CBG's 70-123 x24 hours  Diet Order:   Diet Order             Diet regular Room service appropriate? Yes; Fluid consistency: Thin  Diet effective now                   EDUCATION NEEDS:  Not appropriate for education at this  time  Skin:  Skin Assessment: Reviewed RN Assessment  Last BM:  1/6 type 7 x 2 small  Height:  Ht Readings from Last 1 Encounters:  09/13/24 5' 7 (1.702 m)    Weight:  Wt Readings from Last 1 Encounters:  09/18/24 83 kg    Ideal Body Weight:  58.5 kg (adjusted for bilateral BKA's)  BMI:  Body mass index is 28.66 kg/m.  Estimated Nutritional Needs:   Kcal:  1750-1900 kcals  Protein:  105-120 grams  Fluid:  >/= 1.9L  Royce Maris, RDN, LDN Clinical Nutrition See AMiON for contact information.

## 2024-09-18 NOTE — Progress Notes (Signed)
 Attempted to call and get report from [facility] RN will try again at a later time. Brittny Spangle Jessup RN

## 2024-09-19 ENCOUNTER — Encounter (HOSPITAL_COMMUNITY): Payer: Self-pay

## 2024-09-19 LAB — POCT I-STAT, CHEM 8
BUN: <3 mg/dL — ABNORMAL LOW (ref 8–23)
Calcium, Ion: 0.96 mmol/L — ABNORMAL LOW (ref 1.15–1.40)
Chloride: 67 mmol/L — ABNORMAL LOW (ref 98–111)
Creatinine, Ser: 0.5 mg/dL — ABNORMAL LOW (ref 0.61–1.24)
Glucose, Bld: 152 mg/dL — ABNORMAL HIGH (ref 70–99)
HCT: 42 % (ref 39.0–52.0)
Hemoglobin: 14.3 g/dL (ref 13.0–17.0)
Potassium: 6.6 mmol/L (ref 3.5–5.1)
Sodium: 109 mmol/L — CL (ref 135–145)
TCO2: 38 mmol/L — ABNORMAL HIGH (ref 22–32)

## 2024-09-19 LAB — BASIC METABOLIC PANEL WITH GFR
Anion gap: 7 (ref 5–15)
BUN: <5 mg/dL — ABNORMAL LOW (ref 8–23)
CO2: 37 mmol/L — ABNORMAL HIGH (ref 22–32)
Calcium: 8.9 mg/dL (ref 8.9–10.3)
Chloride: 92 mmol/L — ABNORMAL LOW (ref 98–111)
Creatinine, Ser: 0.55 mg/dL — ABNORMAL LOW (ref 0.61–1.24)
GFR, Estimated: >60 mL/min
Glucose, Bld: 156 mg/dL — ABNORMAL HIGH (ref 70–99)
Potassium: 3.6 mmol/L (ref 3.5–5.1)
Sodium: 137 mmol/L (ref 135–145)

## 2024-09-19 LAB — GLUCOSE, CAPILLARY
Glucose-Capillary: 106 mg/dL — ABNORMAL HIGH (ref 70–99)
Glucose-Capillary: 112 mg/dL — ABNORMAL HIGH (ref 70–99)
Glucose-Capillary: 115 mg/dL — ABNORMAL HIGH (ref 70–99)
Glucose-Capillary: 136 mg/dL — ABNORMAL HIGH (ref 70–99)
Glucose-Capillary: 149 mg/dL — ABNORMAL HIGH (ref 70–99)
Glucose-Capillary: 82 mg/dL (ref 70–99)
Glucose-Capillary: 85 mg/dL (ref 70–99)

## 2024-09-19 LAB — MAGNESIUM: Magnesium: 1.6 mg/dL — ABNORMAL LOW (ref 1.7–2.4)

## 2024-09-19 LAB — PHOSPHORUS: Phosphorus: 3.7 mg/dL (ref 2.5–4.6)

## 2024-09-19 LAB — AMMONIA: Ammonia: 48 umol/L — ABNORMAL HIGH (ref 9–35)

## 2024-09-19 MED ORDER — MAGNESIUM SULFATE 4 GM/100ML IV SOLN
4.0000 g | Freq: Once | INTRAVENOUS | Status: AC
  Administered 2024-09-19: 4 g via INTRAVENOUS
  Filled 2024-09-19: qty 100

## 2024-09-19 MED ORDER — POTASSIUM CHLORIDE CRYS ER 20 MEQ PO TBCR
60.0000 meq | EXTENDED_RELEASE_TABLET | Freq: Once | ORAL | Status: AC
  Administered 2024-09-19: 60 meq via ORAL
  Filled 2024-09-19: qty 3

## 2024-09-19 NOTE — Hospital Course (Signed)
 68yo with hx ETOH abuse, SDH, COPD, tobacco abuse, gouth, HTN, BL BKA, PAD who presented with confusion and unable to maintain airway, intubated for airway protection. Pt was found to have Na of 109. Sodium corrected and pt was extubated, transferred to floor

## 2024-09-19 NOTE — TOC Initial Note (Signed)
 Transition of Care [facility]) - Initial/Assessment Note    Patient Details  Name: Ronald [name] MRN: [id number] Date of Birth: 07-31-1956  [facility] CM/SW Contact:    [name] LOISE Louder, LCSW Phone Number: 09/19/2024, 12:42 PM  Clinical Narrative:                  CSW met with patient at bedside. CSW introduced self and explained role. Patient confirmed he resides at [facility] and is expected to return once stable for d/c.   TOC will continue to follow and assist with discharge planning.   [name] Louder, MSW, LCSW Clinical Social Worker     Expected Discharge Plan: Skilled Nursing Facility Barriers to Discharge: Continued Medical Work up   Patient Goals and CMS Choice Patient states their goals for this hospitalization and ongoing recovery are:: pt intubated   Choice offered to / list presented to : NA      Expected Discharge Plan and Services In-house Referral: Clinical Social Work     Living arrangements for the past 2 months: Skilled Nursing Facility                                      Prior Living Arrangements/Services Living arrangements for the past 2 months: Skilled Nursing Facility Lives with:: Facility Resident Patient language and need for interpreter reviewed:: No Do you feel safe going back to the place where you live?:  (intubated)      Need for Family Participation in Patient Care: Yes (Comment) Care giver support system in place?: Yes (comment) Current home services: DME    Activities of Daily Living   ADL Screening (condition at time of admission) Independently performs ADLs?: Yes (appropriate for developmental age) Is the patient deaf or have difficulty hearing?: No Does the patient have difficulty seeing, even when wearing glasses/contacts?: No Does the patient have difficulty concentrating, remembering, or making decisions?: Yes  Permission Sought/Granted Permission sought to share information with : Family  Supports Permission granted to share information with : No  Share Information with NAME: [name] Bullion  Permission granted to share info w AGENCY: SNF  Permission granted to share info w Relationship: sister  Permission granted to share info w Contact Information: [phone]  Emotional Assessment Appearance:: Appears stated age Attitude/Demeanor/Rapport: Engaged Affect (typically observed): Appropriate Orientation: : Oriented to Self, Oriented to Place, Oriented to  Time, Oriented to Situation Alcohol  / Substance Use: Not Applicable Psych Involvement: No (comment)  Admission diagnosis:  Hyponatremia [E87.1] Hyperammonemia [E72.20] Acute on chronic respiratory failure with hypoxia and hypercapnia (HCC) [G03.78, J96.22] Acute hypoxic respiratory failure (HCC) [J96.01] Patient Active Problem List   Diagnosis Date Noted   Hyperammonemia 09/14/2024   Acute hypoxic respiratory failure (HCC) 09/13/2024   DOE (dyspnea on exertion) 08/07/2024   Chronic respiratory failure with hypoxia (HCC) 08/07/2024   Cigarette smoker 08/07/2024   Hypercapnic respiratory failure (HCC) 04/04/2024   Acute on chronic respiratory failure with hypoxia and hypercapnia (HCC) 02/17/2024   Acute osteomyelitis of right calcaneus (HCC) 11/10/2022   PVD (peripheral vascular disease) 11/10/2022   Respiratory failure (HCC) 10/15/2022   Chronic hypercapnic respiratory failure (HCC) 09/19/2022   Protein-calorie malnutrition, severe 09/04/2022   Acute hypercapnic respiratory failure (HCC) 09/02/2022   Hx of BKA, left (HCC) 09/02/2022   Hypotension 09/02/2022   Right bundle branch block 09/02/2022   Acute respiratory failure (HCC) 09/02/2022   Hyponatremia  03/26/2019   Altered mental status 03/26/2019   Acute hypoxemic respiratory failure (HCC) 03/26/2019   Dysphagia 03/26/2019   Acute encephalopathy 03/18/2019   COPD exacerbation (HCC) 08/29/2017   Subdural hematoma (HCC) 06/19/2014   Critical lower limb  ischemia (HCC) 12/25/2013   Subacute osteomyelitis, right ankle and foot (HCC) 11/06/2013   HTN (hypertension), benign 11/06/2013   Alcohol  abuse 11/06/2013   Osteomyelitis of left foot (HCC) 11/06/2013   COPD (chronic obstructive pulmonary disease) (HCC) 11/06/2013   Accelerated hypertension 09/25/2013   Hypokalemia 09/25/2013   Abscess of left foot 09/21/2013   Cellulitis of left foot 09/19/2013   PCP:  Duwaine Annabella SAILOR, FNP Pharmacy:  No Pharmacies Listed    Social Drivers of Health (SDOH) Social History: SDOH Screenings   Food Insecurity: Patient Unable To Answer (09/17/2024)  Housing: Patient Unable To Answer (09/17/2024)  Transportation Needs: Patient Unable To Answer (09/17/2024)  Utilities: Not At Risk (04/04/2024)  Financial Resource Strain: Not on File (07/30/2022)   [facility]  Physical Activity: Not on File (12/31/2021)   Received from [facility]  Social Connections: Patient Unable To Answer (09/17/2024)  Stress: Not on File (12/31/2021)   Received from [facility]  Tobacco Use: High Risk (09/19/2024)   SDOH Interventions:     Readmission Risk Interventions    02/20/2024   11:45 AM  Readmission Risk Prevention Plan  Transportation Screening Complete  PCP or Specialist Appt within 5-7 Days Complete  Home Care Screening Complete  Medication Review (RN CM) Complete

## 2024-09-19 NOTE — Evaluation (Signed)
 Clinical/Bedside Swallow Evaluation Patient Details  Name: Ronald [name] MRN: [id number] Date of Birth: 1956-07-09  Today's Date: 09/19/2024 Time: SLP Start Time (ACUTE ONLY): 1347 SLP Stop Time (ACUTE ONLY): 1401 SLP Time Calculation (min) (ACUTE ONLY): 14 min  Past Medical History:  Past Medical History:  Diagnosis Date   Critical lower limb ischemia (HCC)    ETOH abuse    Gout    Hypertension    Past Surgical History:  Past Surgical History:  Procedure Laterality Date   ABDOMINAL AORTAGRAM N/A 12/26/2013   Procedure: ABDOMINAL EZELLA;  Surgeon: Gaile LELON New, MD;  Location: [facility] CATH LAB;  Service: Cardiovascular;  Laterality: N/A;   AMPUTATION Left 12/28/2013   Procedure: LEFT BELOW KNEE AMPUTATION;  Surgeon: Jerona LULLA Sage, MD;  Location: [facility] OR;  Service: Orthopedics;  Laterality: Left;   AMPUTATION Right 11/17/2022   Procedure: RIGHT BELOW KNEE AMPUTATION;  Surgeon: Sage Jerona LULLA, MD;  Location: [facility] OR;  Service: Orthopedics;  Laterality: Right;   BELOW KNEE LEG AMPUTATION     Left   LEG SURGERY     HPI:  69 yo male presenting to ED 1/1 from LTC facility with confusion. MRI negative. Admtited with acute metabolic encephalopathy in the setting of hyponatremia. ETT 1/1-1/5. Pt has had multiple MBS, most recently in January 2025, which showed overall functional oropharyngeal swallowing. He has a history of esophageal dysmotility per esophagram 06/09/22. PMH includes polysubstance abuse, bilateral BKA, COPD, chronic hypoxic respiratory failure, chronic hyponatremia, gout, HTN    Assessment / Plan / Recommendation  Clinical Impression  Pt's vocal quality is strong and clear s/p five day intubation. He recalls previous swallow studies and reinforces that he would never want long-term diet modifications. Coughing was observed before PO intake and did not recur with sips of thin liquids or self-fed bites of solids, including during the 3 oz water  test. Esophageal precuations reviewed.  Continue current diet without acute SLP f/u. SLP Visit Diagnosis: Dysphagia, unspecified (R13.10)    Aspiration Risk  Mild aspiration risk    Diet Recommendation           Other Recommendations Oral Care Recommendations: Oral care BID     Swallow Evaluation Recommendations Recommendations: PO diet PO Diet Recommendation: Regular;Thin liquids (Level 0) Liquid Administration via: Cup;Straw Medication Administration: Whole meds with puree Supervision: Patient able to self-feed;Set-up assistance for safety Swallowing strategies  : Minimize environmental distractions;Slow rate;Small bites/sips Postural changes: Position pt fully upright for meals;Stay upright 30-60 min after meals Oral care recommendations: Oral care BID (2x/day)   Assistance Recommended at Discharge    Functional Status Assessment Patient has not had a recent decline in their functional status  Frequency and Duration            Prognosis Barriers to Reach Goals: Time post onset      Swallow Study   General HPI: 69 yo male presenting to ED 1/1 from LTC facility with confusion. MRI negative. Admtited with acute metabolic encephalopathy in the setting of hyponatremia. ETT 1/1-1/5. Pt has had multiple MBS, most recently in January 2025, which showed overall functional oropharyngeal swallowing. He has a history of esophageal dysmotility per esophagram 06/09/22. PMH includes polysubstance abuse, bilateral BKA, COPD, chronic hypoxic respiratory failure, chronic hyponatremia, gout, HTN Type of Study: Bedside Swallow Evaluation Previous Swallow Assessment: see hPI Diet Prior to this Study: Regular;Thin liquids (Level 0) Temperature Spikes Noted: No Respiratory Status: Room air History of Recent Intubation: Yes Total duration of intubation (days): 5 days Date  extubated: 09/17/24 Behavior/Cognition: Alert;Cooperative Oral Cavity Assessment: Within Functional Limits Oral Care Completed by SLP: No Oral Cavity - Dentition:  Missing dentition;Poor condition Vision: Functional for self-feeding Self-Feeding Abilities: Able to feed self Patient Positioning: Upright in bed Baseline Vocal Quality: Normal Volitional Cough: Strong Volitional Swallow: Able to elicit    Oral/Motor/Sensory Function Overall Oral Motor/Sensory Function: Within functional limits   Ice Chips Ice chips: Not tested   Thin Liquid Thin Liquid: Within functional limits Presentation: Straw;Self Fed    Nectar Thick Nectar Thick Liquid: Not tested   Honey Thick Honey Thick Liquid: Not tested   Puree Puree: Not tested   Solid     Solid: Within functional limits Presentation: Self Fed      Damien Blumenthal, M.A., CCC-SLP Speech Language Pathology, Acute Rehabilitation Services  Secure Chat preferred [phone]  09/19/2024,2:18 PM

## 2024-09-19 NOTE — Progress Notes (Signed)
" °  Progress Note   Patient: Ronald [name] FMW:[id number] DOB: 1956-06-23 DOA: 09/13/2024     6 DOS: the patient was seen and examined on 09/19/2024   Brief hospital course: 68yo with hx ETOH abuse, SDH, COPD, tobacco abuse, gouth, HTN, BL BKA, PAD who presented with confusion and unable to maintain airway, intubated for airway protection. Pt was found to have Na of 109. Sodium corrected and pt was extubated, transferred to floor  Assessment and Plan: Seizures related to hyponatremia -initially required intubation, now extubated -sodium now normalized s/p DDAVP  and 3% saline boluses -Pt was seen and followed by Neurology. 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 No interventions for severe L vertebral artery occlusion of ICA stenosis recommended by Neurology  Toxic metabolic encephalopathy -seems resolved  Acute on chronic hypoxemic respiratory failure requiring intubation -Pt is on 3LNC at baseline -Initially required intubation. Now extubated -Pt seen on 5LNC this AM with O2 sats at 100%. Recommend wean O2 for goal O2 sat of 88-94%  HTN -BP stable and controlled -cont norvasc  10mg  and metoprolol  50mg  BID  Hx ETOH abuse -Seems stable at this time  PAD -Pt is s/p B BKA     Subjective: Reports feeling much better this AM  Physical Exam: Vitals:   09/19/24 0007 09/19/24 0358 09/19/24 0818 09/19/24 1124  BP: 125/68 135/75 139/72 111/69  Pulse: 81 91 (!) 102 86  Resp: (!) 23 20 20 20   Temp: 98.1 F (36.7 C) 98.5 F (36.9 C) 98.4 F (36.9 C) 98.9 F (37.2 C)  TempSrc: Oral Oral Oral Oral  SpO2: 91% 100% 100% 100%  Weight:  85.5 kg    Height:       General exam: Awake, laying in bed, in nad Respiratory system: Normal respiratory effort, no wheezing Cardiovascular system: regular rate, s1, s2 Gastrointestinal system: Soft, nondistended, positive BS Central nervous system: CN2-12 grossly intact, strength intact Extremities: S/p BLE amputations Skin: Normal skin turgor, no notable  skin lesions seen Psychiatry: Mood normal // no visual hallucinations   Data Reviewed:  Labs reviewed: Na 137, K 3.6, Cr 0.55, Mg 1.6  Family Communication: Pt in room, family not at bedside  Disposition: Status is: Inpatient Remains inpatient appropriate because: severity of illness  Planned Discharge Destination: Skilled nursing facility    Author: Garnette Pelt, MD 09/19/2024 3:40 PM  For on call review [email or web address].  "

## 2024-09-20 LAB — COMPREHENSIVE METABOLIC PANEL WITH GFR
ALT: 14 U/L (ref 0–44)
AST: 22 U/L (ref 15–41)
Albumin: 3.7 g/dL (ref 3.5–5.0)
Alkaline Phosphatase: 58 U/L (ref 38–126)
Anion gap: 6 (ref 5–15)
BUN: <5 mg/dL — ABNORMAL LOW (ref 8–23)
CO2: 37 mmol/L — ABNORMAL HIGH (ref 22–32)
Calcium: 9.1 mg/dL (ref 8.9–10.3)
Chloride: 94 mmol/L — ABNORMAL LOW (ref 98–111)
Creatinine, Ser: 0.52 mg/dL — ABNORMAL LOW (ref 0.61–1.24)
GFR, Estimated: >60 mL/min
Glucose, Bld: 102 mg/dL — ABNORMAL HIGH (ref 70–99)
Potassium: 4.5 mmol/L (ref 3.5–5.1)
Sodium: 137 mmol/L (ref 135–145)
Total Bilirubin: 0.3 mg/dL (ref 0.0–1.2)
Total Protein: 6.7 g/dL (ref 6.5–8.1)

## 2024-09-20 LAB — GLUCOSE, CAPILLARY
Glucose-Capillary: 86 mg/dL (ref 70–99)
Glucose-Capillary: 98 mg/dL (ref 70–99)
Glucose-Capillary: 148 mg/dL — ABNORMAL HIGH (ref 70–99)
Glucose-Capillary: 109 mg/dL — ABNORMAL HIGH (ref 70–99)

## 2024-09-20 LAB — CBC
HCT: 30.8 % — ABNORMAL LOW (ref 39.0–52.0)
Hemoglobin: 10 g/dL — ABNORMAL LOW (ref 13.0–17.0)
MCH: 30.8 pg (ref 26.0–34.0)
MCHC: 32.5 g/dL (ref 30.0–36.0)
MCV: 94.8 fL (ref 80.0–100.0)
Platelets: 379 K/uL (ref 150–400)
RBC: 3.25 MIL/uL — ABNORMAL LOW (ref 4.22–5.81)
RDW: 13.2 % (ref 11.5–15.5)
WBC: 10.2 K/uL (ref 4.0–10.5)
nRBC: 0 % (ref 0.0–0.2)

## 2024-09-20 MED ORDER — ALBUTEROL SULFATE (2.5 MG/3ML) 0.083% IN NEBU: 2.5000 mg | INHALATION_SOLUTION | RESPIRATORY_TRACT | Status: AC | PRN

## 2024-09-20 MED ORDER — AMOXICILLIN-POT CLAVULANATE 875-125 MG PO TABS: 1.0000 | ORAL_TABLET | Freq: Two times a day (BID) | ORAL | 0 refills | Status: AC

## 2024-09-20 MED ORDER — HALOPERIDOL LACTATE 5 MG/ML IJ SOLN
5.0000 mg | Freq: Once | INTRAMUSCULAR | Status: AC | PRN
  Administered 2024-09-20: 5 mg via INTRAVENOUS
  Filled 2024-09-20: qty 1

## 2024-09-20 NOTE — Progress Notes (Signed)
 Physical Therapy Treatment Patient Details Name: Ronald [name] MRN: [id number] DOB: 12/26/1955 Today's Date: 09/20/2024   History of Present Illness Pt is 69 yo presenting to [facility] on 1/1 due to confusion with suspected metabolic encephalopathy in setting of hyponatremia. Pt was intubated on arrival and extubated 1/5. PMH: polysubstance abuse, cocaine , COPD, chronic hypoxemic respiratory failure, alcohol  abuse, bilateral BKA, chronic hyponatremia, gout, HTN    PT Comments  Pt resting in bed on arrival, agreeable to session and demonstrating slow progress towards acute goals. Pt performing bed mobility with grossly min A to manage Les and trunk. Once up EOB pt able to maintain with BUE support, however pt with stated fear of falling. Chair back placed in front of pt for increased UE support with pt maintain sitting ~10 mins. Pt declining transfers OOB and returning to supine at end of session. Pt continues to benefit from skilled PT services to progress toward functional mobility goals.     If plan is discharge home, recommend the following: A little help with walking and/or transfers;Assist for transportation;Assistance with cooking/housework;Supervision due to cognitive status   Can travel by private vehicle     No  Equipment Recommendations  None recommended by PT    Recommendations for Other Services       Precautions / Restrictions Precautions Precautions: Fall Recall of Precautions/Restrictions: Impaired Restrictions Weight Bearing Restrictions Per Provider Order: No     Mobility  Bed Mobility Overal bed mobility: Needs Assistance Bed Mobility: Supine to Sit, Sit to Supine     Supine to sit: Min assist, HOB elevated Sit to supine: Min assist   General bed mobility comments: min A to manage Les to and off EOB, cues for rail use and min A to elevate trunk to sitting    Transfers Overall transfer level: Needs assistance                 General transfer comment: pt  declining OOB transfers    Ambulation/Gait                   Stairs             Wheelchair Mobility     Tilt Bed    Modified Rankin (Stroke Patients Only)       Balance Overall balance assessment: Needs assistance Sitting-balance support: Bilateral upper extremity supported, Feet unsupported Sitting balance-Leahy Scale: Fair Sitting balance - Comments: able to maintain sitting up EOB with unilateral UE support, pt stating fear of falling, placed chair in front of pt for pt to place BUE on for supprot                                    Communication Communication Communication: No apparent difficulties  Cognition Arousal: Alert Behavior During Therapy: WFL for tasks assessed/performed, Agitated   PT - Cognitive impairments: Difficult to assess, No family/caregiver present to determine baseline                         Following commands: Intact      Cueing Cueing Techniques: Verbal cues, Visual cues  Exercises      General Comments General comments (skin integrity, edema, etc.): VSS on supplemental O2      Pertinent Vitals/Pain Pain Assessment Pain Assessment: No/denies pain    Home Living  Prior Function            PT Goals (current goals can now be found in the care plan section) Acute Rehab PT Goals PT Goal Formulation: Patient unable to participate in goal setting Time For Goal Achievement: 10/02/24 Progress towards PT goals: Progressing toward goals    Frequency    Min 1X/week      PT Plan      Co-evaluation              AM-PAC PT 6 Clicks Mobility   Outcome Measure  Help needed turning from your back to your side while in a flat bed without using bedrails?: A Little Help needed moving from lying on your back to sitting on the side of a flat bed without using bedrails?: A Little Help needed moving to and from a bed to a chair (including a wheelchair)?: A  Little Help needed standing up from a chair using your arms (e.g., wheelchair or bedside chair)?: Total Help needed to walk in hospital room?: Total Help needed climbing 3-5 steps with a railing? : Total 6 Click Score: 12    End of Session   Activity Tolerance: Patient tolerated treatment well Patient left: in bed;with call bell/phone within reach;with bed alarm set Nurse Communication: Mobility status PT Visit Diagnosis: Other abnormalities of gait and mobility (R26.89)     Time: 8643-8580 PT Time Calculation (min) (ACUTE ONLY): 23 min  Charges:    $Therapeutic Activity: 23-37 mins PT General Charges $$ ACUTE PT VISIT: 1 Visit                     Therisa R. PTA Acute Rehabilitation Services Office: [phone]   Therisa CHRISTELLA Boor 09/20/2024, 2:26 PM

## 2024-09-20 NOTE — Plan of Care (Signed)
" °  Problem: Safety: Goal: Non-violent Restraint(s) Outcome: Progressing   Problem: Education: Goal: Ability to describe self-care measures that may prevent or decrease complications (Diabetes Survival Skills Education) will improve Outcome: Progressing Goal: Individualized Educational Video(s) Outcome: Progressing   Problem: Coping: Goal: Ability to adjust to condition or change in health will improve Outcome: Progressing   Problem: Fluid Volume: Goal: Ability to maintain a balanced intake and output will improve Outcome: Progressing   Problem: Health Behavior/Discharge Planning: Goal: Ability to identify and utilize available resources and services will improve Outcome: Progressing Goal: Ability to manage health-related needs will improve Outcome: Progressing   Problem: Metabolic: Goal: Ability to maintain appropriate glucose levels will improve Outcome: Progressing   "

## 2024-09-20 NOTE — Progress Notes (Signed)
 PICC removed per protocol per MD order. Manual pressure applied for 5 mins. Vaseline gauze, gauze, and Tegaderm applied over insertion site. No bleeding or swelling noted. Instructed patient to remain in bed for thirty mins. Educated patient about S/S of infection and when to call MD; no heavy lifting or pressure on right side for 24 hours; keep dressing dry and intact for 24 hours. Pt verbalized comprehension.

## 2024-09-20 NOTE — Plan of Care (Signed)
  Problem: Safety: Goal: Non-violent Restraint(s) Outcome: Progressing   Problem: Education: Goal: Ability to describe self-care measures that may prevent or decrease complications (Diabetes Survival Skills Education) will improve Outcome: Progressing Goal: Individualized Educational Video(s) Outcome: Progressing   Problem: Coping: Goal: Ability to adjust to condition or change in health will improve Outcome: Progressing   Problem: Fluid Volume: Goal: Ability to maintain a balanced intake and output will improve Outcome: Progressing   Problem: Health Behavior/Discharge Planning: Goal: Ability to identify and utilize available resources and services will improve Outcome: Progressing Goal: Ability to manage health-related needs will improve Outcome: Progressing   Problem: Metabolic: Goal: Ability to maintain appropriate glucose levels will improve Outcome: Progressing   Problem: Nutritional: Goal: Maintenance of adequate nutrition will improve Outcome: Progressing Goal: Progress toward achieving an optimal weight will improve Outcome: Progressing   Problem: Skin Integrity: Goal: Risk for impaired skin integrity will decrease Outcome: Progressing   Problem: Tissue Perfusion: Goal: Adequacy of tissue perfusion will improve Outcome: Progressing   Problem: Education: Goal: Knowledge of General Education information will improve Description: Including pain rating scale, medication(s)/side effects and non-pharmacologic comfort measures Outcome: Progressing   Problem: Health Behavior/Discharge Planning: Goal: Ability to manage health-related needs will improve Outcome: Progressing   Problem: Clinical Measurements: Goal: Ability to maintain clinical measurements within normal limits will improve Outcome: Progressing Goal: Will remain free from infection Outcome: Progressing Goal: Diagnostic test results will improve Outcome: Progressing Goal: Respiratory complications  will improve Outcome: Progressing Goal: Cardiovascular complication will be avoided Outcome: Progressing   Problem: Activity: Goal: Risk for activity intolerance will decrease Outcome: Progressing   Problem: Nutrition: Goal: Adequate nutrition will be maintained Outcome: Progressing   Problem: Coping: Goal: Level of anxiety will decrease Outcome: Progressing   Problem: Elimination: Goal: Will not experience complications related to bowel motility Outcome: Progressing Goal: Will not experience complications related to urinary retention Outcome: Progressing   Problem: Pain Managment: Goal: General experience of comfort will improve and/or be controlled Outcome: Progressing   Problem: Safety: Goal: Ability to remain free from injury will improve Outcome: Progressing   Problem: Skin Integrity: Goal: Risk for impaired skin integrity will decrease Outcome: Progressing

## 2024-09-20 NOTE — TOC Transition Note (Addendum)
 Transition of Care [facility]) - Discharge Note   Patient Details  Name: Ronald  [name] MRN: [id number] Date of Birth: 1956-08-20  [facility] CM/SW Contact:  [name] LOISE Louder, LCSW Phone Number: 09/20/2024, 3:20 PM  Update Per RN- PICC lines needs to be removed: called [facility] place transport on hold- RN will need to call [facility] @ [phone] once pt is ready for transport.   Clinical Narrative:     Patient will Discharge to: [facility] Discharge Date: 09/20/24 Family Notified: Transport Ab:[facility]  Per MD patient is ready for discharge. RN, patient, and facility notified of discharge. Discharge Summary sent to facility. RN given number for [phone], Room 222-B. Ambulance transport requested for patient.   Clinical Social Worker signing off.  [name] Louder, MSW, LCSW Clinical Social Worker     Final next level of care: Skilled Nursing Facility Barriers to Discharge: Barriers Resolved   Patient Goals and CMS Choice Patient states their goals for this hospitalization and ongoing recovery are:: pt intubated   Choice offered to / list presented to : NA      Discharge Placement              Patient chooses bed at:  [facility]) Patient to be transferred to facility by: [facility]   Patient and family notified of of transfer: 09/20/24  Discharge Plan and Services Additional resources added to the After Visit Summary for   In-house Referral: Clinical Social Work                                   Social Drivers of Health (SDOH) Interventions SDOH Screenings   Food Insecurity: Patient Unable To Answer (09/17/2024)  Housing: Patient Unable To Answer (09/17/2024)  Transportation Needs: Patient Unable To Answer (09/17/2024)  Utilities: Not At Risk (04/04/2024)  Financial Resource Strain: Not on File (07/30/2022)   Received from [facility]  Physical Activity: Not on File (12/31/2021)   Received from [facility]  Social Connections: Patient Unable To Answer  (09/17/2024)  Stress: Not on File (12/31/2021)   Received from [facility]  Tobacco Use: High Risk (09/19/2024)     Readmission Risk Interventions    02/20/2024   11:45 AM  Readmission Risk Prevention Plan  Transportation Screening Complete  PCP or Specialist Appt within 5-7 Days Complete  Home Care Screening Complete  Medication Review (RN CM) Complete

## 2024-09-20 NOTE — Progress Notes (Signed)
 Report called to [facility] LPN. Patient will transfer Via [facility] to bed 222-B once PICC line is removed.

## 2024-09-20 NOTE — Discharge Summary (Signed)
 " Physician Discharge Summary   Patient: Ronald [name] MRN: [id number] DOB: [birth year]  Admit date:     09/13/2024  Discharge date: 09/20/2024  Discharge Physician: Garnette Pelt   PCP: Ronald Annabella SAILOR, FNP   Recommendations at discharge:    Follow up with PCP in 1-2 weeks  Discharge Diagnoses: Principal Problem:   Acute hypoxic respiratory failure (HCC) Active Problems:   Hyperammonemia  Resolved Problems:   * No resolved hospital problems. [facility] Course: 68yo with hx ETOH abuse, SDH, COPD, tobacco abuse, gouth, HTN, BL BKA, PAD who presented with confusion and unable to maintain airway, intubated for airway protection. Pt was found to have Na of 109. Sodium corrected and pt was extubated, transferred to floor  Assessment and Plan: Seizures related to hyponatremia -initially required intubation, now extubated -sodium now normalized s/p DDAVP  and 3% saline boluses -Pt was seen and followed by Neurology. 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 No interventions for severe L vertebral artery occlusion of ICA stenosis recommended by Neurology   Toxic metabolic encephalopathy -seems resolved   Acute on chronic hypoxemic respiratory failure requiring intubation -Pt is on 3LNC at baseline -Initially required intubation. Now extubated -O2 was successfully weaned to pt's baseline O2 requirements   HTN -BP stable and controlled -cont norvasc  10mg  and metoprolol  50mg  BID   Hx ETOH abuse -Seems stable at this time   PAD -Pt is s/p B BKA        Consultants: PCCM Procedures performed:   Disposition: Skilled nursing facility Diet recommendation:  Cardiac diet DISCHARGE MEDICATION: Allergies as of 09/20/2024   No Known Allergies      Medication List     STOP taking these medications    cetirizine 10 MG tablet Commonly known as: ZYRTEC   famotidine  20 MG tablet Commonly known as: PEPCID        TAKE these medications    acetaminophen  500 MG tablet Commonly known as:  TYLENOL  Take 1,000 mg by mouth 2 (two) times daily.   albuterol  (2.5 MG/3ML) 0.083% nebulizer solution Commonly known as: PROVENTIL  Take 3 mLs (2.5 mg total) by nebulization every 4 (four) hours as needed for wheezing.   amLODipine  10 MG tablet Commonly known as: NORVASC  Take 10 mg by mouth daily.   amoxicillin -clavulanate 875-125 MG tablet Commonly known as: AUGMENTIN  Take 1 tablet by mouth 2 (two) times daily for 3 days.   benzonatate 200 MG capsule Commonly known as: TESSALON Take 200 mg by mouth every 8 (eight) hours as needed for cough.   budesonide  0.25 MG/2ML nebulizer solution Commonly known as: PULMICORT  Take 0.25 mg by nebulization in the morning and at bedtime.   busPIRone  5 MG tablet Commonly known as: BUSPAR  Take 5 mg by mouth in the morning and at bedtime.   cholecalciferol 25 MCG (1000 UNIT) tablet Commonly known as: VITAMIN D3 Take 2,000 Units by mouth daily.   docusate sodium  100 MG capsule Commonly known as: COLACE Take 100 mg by mouth daily.   fluticasone  50 MCG/ACT nasal spray Commonly known as: FLONASE Place 1 spray into both nostrils daily.   guaiFENesin -dextromethorphan  100-10 MG/5ML syrup Commonly known as: ROBITUSSIN DM Take 10 mLs by mouth every 6 (six) hours as needed for cough.   ipratropium-albuterol  0.5-2.5 (3) MG/3ML Soln Commonly known as: DUONEB Take 3 mLs by nebulization 4 (four) times daily. Inhale 1 vial via nebulizer 4 times daily (scheduled) - AND - inhale 1 vial (for 15 minutes only) every 6 hours as needed for wheezing or  shortness of breath.   melatonin 3 MG Tabs tablet Take 3 mg by mouth at bedtime.   metoprolol  tartrate 50 MG tablet Commonly known as: LOPRESSOR  Take 1 tablet (50 mg total) by mouth 2 (two) times daily.   omeprazole  20 MG capsule Commonly known as: PRILOSEC Take 40 mg by mouth 2 (two) times daily.   OXYGEN Inhale 3 L/min into the lungs continuous.   polyethylene glycol 17 g packet Commonly known  as: MIRALAX  / GLYCOLAX  Take 17 g by mouth daily as needed for moderate constipation.   thiamine  100 MG tablet Commonly known as: VITAMIN B1 Take 1 tablet (100 mg total) by mouth daily.        Follow-up Information     Ronald Annabella SAILOR, FNP Follow up in 2 week(s).   Specialty: Family Medicine Why: Hospital follow up Contact information: [address] [place] [address] [phone]                Discharge Exam: [name]   09/18/24 0500 09/19/24 0358 09/20/24 0716  Weight: 83 kg 85.5 kg 85 kg   General exam: Awake, laying in bed, in nad Respiratory system: Normal respiratory effort, no wheezing Cardiovascular system: regular rate, s1, s2 Gastrointestinal system: Soft, nondistended, positive BS Central nervous system: CN2-12 grossly intact, strength intact Extremities: Perfused, no clubbing Skin: Normal skin turgor, no notable skin lesions seen Psychiatry: Mood normal // no visual hallucinations   Condition at discharge: fair  The results of significant diagnostics from this hospitalization (including imaging, microbiology, ancillary and laboratory) are listed below for reference.   Imaging Studies: US  EKG SITE RITE Result Date: 09/16/2024 If Site Rite image not attached, placement could not be confirmed due to current cardiac rhythm.  MR BRAIN WO CONTRAST Result Date: 09/13/2024 EXAM: MRI BRAIN WITHOUT CONTRAST 09/13/2024 05:22:29 PM TECHNIQUE: Multiplanar multisequence MRI of the head/brain was performed without the administration of intravenous contrast. COMPARISON: CT head and CTA head and neck from earlier the same day. CLINICAL HISTORY: Altered mental status, nontraumatic (Ped 0-17y). FINDINGS: LIMITATIONS/ARTIFACTS: Examination slightly limited by motion artifact. BRAIN AND VENTRICLES: No acute infarct. No acute intracranial hemorrhage. No mass. No midline shift. No hydrocephalus. T2 and FLAIR hyperintensity in the periventricular and  subcortical white matter suggestive of mild to moderate chronic microvascular ischemic changes. Parenchymal volume loss. Remote lacunar infarcts in the bilateral basal ganglia. Chronic microhemorrhages in the right basal ganglia and left thalamus. Partially visualized endotracheal tube. The sella is unremarkable. Normal flow voids. ORBITS: No acute abnormality. SINUSES AND MASTOIDS: Extensive mucosal thickening throughout the paranasal sinuses. Air fluid level in the left frontal sinus. Likely an additional air fluid level in the left sphenoid sinus. Recommend correlation for acute sinusitis. Fluid and secretions noted within the nasal cavity and nasopharynx. Bilateral mastoid effusions, left greater than right. BONES AND SOFT TISSUES: Normal marrow signal. No acute soft tissue abnormality. IMPRESSION: 1. No acute intracranial abnormality. 2. Remote lacunar infarcts in the bilateral basal ganglia. 3. Mild to moderate chronic microvascular ischemic changes and mild volume loss. 4. Extensive paranasal sinus disease with air-fluid levels suggestive of acute sinusitis. Electronically signed by: Donnice Mania MD 09/13/2024 06:08 PM EST RP Workstation: [id number]   DG Abd 1 View Result Date: 09/13/2024 CLINICAL DATA:  Orogastric tube placement. EXAM: ABDOMEN - 1 VIEW COMPARISON:  Earlier today FINDINGS: Tip and side port of the enteric tube below the diaphragm in the stomach. The included upper abdominal bowel gas pattern is normal. IMPRESSION: Tip  and side port of the enteric tube below the diaphragm in the stomach. Electronically Signed   By: Andrea Gasman M.D.   On: 09/13/2024 17:35   EEG adult Result Date: 09/13/2024 Shelton Arlin KIDD, MD     09/13/2024  2:46 PM Patient Name: Eugene  Moorman MRN: [id number] Epilepsy Attending: Arlin KIDD Shelton Referring Physician/Provider: Arloa Benton BIRCH, NP Date: 09/13/2024 Duration: 22.24 mins Patient history: 69yo M with ams. EEG to evaluate for seizure Level of alertness:  Comatose/ lethargic AEDs during EEG study: Propofol  Technical aspects: This EEG study was done with scalp electrodes positioned according to the 10-20 International system of electrode placement. Electrical activity was reviewed with band pass filter of 1-70Hz , sensitivity of 7 uV/mm, display speed of 49mm/sec with a 60Hz  notched filter applied as appropriate. EEG data were recorded continuously and digitally stored.  Video monitoring was available and reviewed as appropriate. Description: EEG showed continuous generalized 3 to 6 Hz theta-delta slowing. Hyperventilation and photic stimulation were not performed.   ABNORMALITY - Continuous slow, generalized IMPRESSION: This study is suggestive of generalized cerebral dysfunction  (encephalopathy). No seizures or epileptiform discharges were seen throughout the recording. Arlin KIDD Shelton   DG Chest Portable 1 View Result Date: 09/13/2024 EXAM: 1 VIEW(S) XRAY OF THE CHEST 09/13/2024 05:39:00 AM COMPARISON: 04/04/2024 CLINICAL HISTORY: post intubation FINDINGS: LINES, TUBES AND DEVICES: Endotracheal tube in place with tip 3.3 cm above the carina. Enteric tube in place with tip and side port below the hemidiaphragm. LUNGS AND PLEURA: No focal pulmonary opacity. No pleural effusion. No pneumothorax. HEART AND MEDIASTINUM: Atherosclerotic plaque noted. BONES AND SOFT TISSUES: No acute osseous abnormality. IMPRESSION: 1. Endotracheal tube and enteric tube in appropriate position. 2. No acute findings. Electronically signed by: Waddell Calk MD 09/13/2024 06:27 AM EST RP Workstation: [id number]   DG Abdomen 1 View Result Date: 09/13/2024 EXAM: 1 VIEW XRAY OF THE ABDOMEN 09/13/2024 05:38:00 AM COMPARISON: 04/04/2024 CLINICAL HISTORY: OG tube placement FINDINGS: LINES, TUBES AND DEVICES: Enteric tube with tip and side port in the gastric fundus. BOWEL: Nonobstructive bowel gas pattern. SOFT TISSUES: No abnormal calcifications. BONES: No acute fracture. IMPRESSION: 1.  Enteric tube in place with tip and side port in the gastric fundus. Electronically signed by: Waddell Calk MD 09/13/2024 06:24 AM EST RP Workstation: [id number]   CT ANGIO HEAD NECK W WO CM (CODE STROKE) Result Date: 09/13/2024 EXAM: CTA HEAD AND NECK WITHOUT AND WITH 09/13/2024 03:16:12 AM TECHNIQUE: CTA of the head and neck was performed without and with the administration of 75 mL of intravenous iohexol  (OMNIPAQUE ) 350 MG/ML injection. Multiplanar 2D and/or 3D reformatted images are provided for review. Automated exposure control, iterative reconstruction, and/or weight based adjustment of the mA/kV was utilized to reduce the radiation dose to as low as reasonably achievable. Stenosis of the internal carotid arteries measured using NASCET criteria. COMPARISON: None available CLINICAL HISTORY: Neuro deficit, acute, stroke suspected. FINDINGS: CTA NECK: AORTIC ARCH AND ARCH VESSELS: Calcific aortic atherosclerosis. No dissection or arterial injury. No significant stenosis of the brachiocephalic or subclavian arteries. CERVICAL CAROTID ARTERIES: Atherosclerotic calcification at both carotid bifurcations. No hemodynamically significant stenosis of the internal carotid arteries. Adjacent to the proximal right external carotid artery is a slightly hypoenhancing focus (series 6 image 194) measuring 7 mm. This is in close proximity to the right internal jugular vein and may represent a venous varix. CERVICAL VERTEBRAL ARTERIES: The vertebral arteries are left dominant. There is severe stenosis of the left vertebral artery origin due to calcific  atherosclerosis. There is moderate atherosclerosis of the left vertebral artery V4 segment without hemodynamically significant stenosis. The right vertebral artery terminates in the PICA. No dissection or arterial injury. LUNGS AND MEDIASTINUM: Pulmonary emphysema. SOFT TISSUES: No acute abnormality. BONES: No acute abnormality. CTA HEAD: ANTERIOR CIRCULATION: Atherosclerotic  calcification of both carotid siphons with moderate stenosis of the supraclinoid left ICA. No significant stenosis of the anterior cerebral arteries. No significant stenosis of the middle cerebral arteries. No aneurysm. POSTERIOR CIRCULATION: Tortuous course of the basilar artery. No significant stenosis of the posterior cerebral arteries. No significant stenosis of the vertebral arteries. No aneurysm. OTHER: No dural venous sinus thrombosis on this non-dedicated study. IMPRESSION: 1. No emergent large vessel occlusion. 2. 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 3. Moderate stenosis of the supraclinoid left ICA. 4. Atherosclerotic calcification at both carotid bifurcations without hemodynamically significant stenosis of the internal carotid arteries. 5. Indeterminate hypoenhancing structure lateral to the right external carotid artery. Possibly a varix arising from a branch of the right internal jugular vein. Electronically signed by: Franky Stanford MD 09/13/2024 03:30 AM EST RP Workstation: [id number]   CT HEAD CODE STROKE WO CONTRAST Result Date: 09/13/2024 EXAM: CT HEAD WITHOUT CONTRAST 09/13/2024 03:11:51 AM TECHNIQUE: CT of the head was performed without the administration of intravenous contrast. Automated exposure control, iterative reconstruction, and/or weight based adjustment of the mA/kV was utilized to reduce the radiation dose to as low as reasonably achievable. COMPARISON: None available. CLINICAL HISTORY: Neuro deficit, acute, stroke suspected. FINDINGS: BRAIN AND VENTRICLES: No acute hemorrhage. No evidence of acute infarct. No hydrocephalus. No extra-axial collection. No mass effect or midline shift. [place] Stroke Program Early CT Score (ASPECTS): Ganglionic (caudate, internal capsule, lentiform nucleus, insula, M1-m3): 7. Supraganglionic (m4-m6): 3. Total: 10. 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 Chronic ischemic white matter changes.  ORBITS: No acute abnormality. SINUSES: No acute abnormality. SOFT TISSUES AND SKULL: No acute soft tissue abnormality. No skull fracture. IMPRESSION: 1. No acute intracranial abnormality. ASPECTS 10. 2. 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 3. Findings communicated to Dr. Salman Khaliqdina at 3:17 AM on 09/13/24. Electronically signed by: Franky Stanford MD 09/13/2024 03:18 AM EST RP Workstation: [id number]    Microbiology: Results for orders placed or performed during the hospital encounter of 09/13/24  Culture, blood (single) w Reflex to ID Panel     Status: None   Collection Time: 09/13/24  3:35 AM   Specimen: BLOOD LEFT ARM  Result Value Ref Range Status   Specimen Description BLOOD LEFT ARM  Final   Special Requests   Final    BOTTLES DRAWN AEROBIC ONLY Blood Culture adequate volume   Culture   Final    NO GROWTH 5 DAYS Performed at [facility], [address] [place], [place] [address]    Report Status 09/18/2024 FINAL  Final    Labs: CBC: Recent Labs  Lab 09/15/24 0314 09/16/24 0317 09/17/24 0231 09/18/24 0527 09/18/24 0750 09/20/24 0638  WBC 16.9* 14.4* 10.2 8.0  --  10.2  HGB 12.0* 10.4* 9.8* 7.6* 9.3* 10.0*  HCT 34.7* 30.2* 29.6* 23.1* 28.5* 30.8*  MCV 89.4 89.6 91.9 92.4  --  94.8  PLT 261 233 293 257  --  379   Basic Metabolic Panel: Recent Labs  Lab 09/15/24 0314 09/15/24 1036 09/16/24 0906 09/16/24 9092 09/17/24 0231 09/17/24 1037 09/17/24 2136 09/18/24 0527 09/18/24 0750 09/19/24 9383 09/20/24 9361  NA  122*   < > 127*   < > 134* 136 138  --  138 137 137  K 4.1  --  4.0  --  3.5  --   --   --  3.9 3.6 4.5  CL 84*  --  88*  --  93*  --   --   --  94* 92* 94*  CO2 25  --  31  --  34*  --   --   --  36* 37* 37*  GLUCOSE 109*  --  138*  --  145*  --   --   --  93 156* 102*  BUN 13  --  11  --  9  --   --   --  7* <5* <5*  CREATININE 1.23  --  0.65  --  0.59*  --   --   --  0.57* 0.55* 0.52*   CALCIUM  8.6*  --  8.5*  --  8.6*  --   --   --  8.7* 8.9 9.1  MG 2.4  --  2.0  --  2.0  --   --  1.3*  --  1.6*  --   PHOS 4.8*  --  2.3*  --  3.7  --   --  2.4*  --  3.7  --    < > = values in this interval not displayed.   Liver Function Tests: Recent Labs  Lab 09/20/24 0638  AST 22  ALT 14  ALKPHOS 58  BILITOT 0.3  PROT 6.7  ALBUMIN 3.7   CBG: Recent Labs  Lab 09/19/24 1949 09/19/24 2321 09/20/24 0329 09/20/24 0742 09/20/24 1116  GLUCAP 106* 112* 86 98 148*    Discharge time spent: less than 30 minutes.  Signed: Garnette Pelt, MD [facility] 09/20/2024 "

## 2025-02-12 ENCOUNTER — Ambulatory Visit: Admitting: Internal Medicine

## 2025-02-12 ENCOUNTER — Encounter
